# Patient Record
Sex: Female | Born: 1935 | Race: White | Hispanic: No | Marital: Single | Smoking: Never smoker
Health system: Southern US, Community
[De-identification: ages and names within clinical notes are randomized; demographics above are authoritative.]

## PROBLEM LIST (undated history)

## (undated) DIAGNOSIS — Z9049 Acquired absence of other specified parts of digestive tract: Secondary | ICD-10-CM

## (undated) DIAGNOSIS — Y95 Nosocomial condition: Secondary | ICD-10-CM

## (undated) DIAGNOSIS — Y92009 Unspecified place in unspecified non-institutional (private) residence as the place of occurrence of the external cause: Secondary | ICD-10-CM

## (undated) DIAGNOSIS — J189 Pneumonia, unspecified organism: Secondary | ICD-10-CM

## (undated) DIAGNOSIS — C44310 Basal cell carcinoma of skin of unspecified parts of face: Secondary | ICD-10-CM

## (undated) DIAGNOSIS — Z79890 Hormone replacement therapy (postmenopausal): Secondary | ICD-10-CM

## (undated) DIAGNOSIS — T7840XA Allergy, unspecified, initial encounter: Secondary | ICD-10-CM

## (undated) DIAGNOSIS — M545 Low back pain, unspecified: Secondary | ICD-10-CM

## (undated) DIAGNOSIS — R112 Nausea with vomiting, unspecified: Secondary | ICD-10-CM

## (undated) DIAGNOSIS — I499 Cardiac arrhythmia, unspecified: Secondary | ICD-10-CM

## (undated) DIAGNOSIS — W19XXXA Unspecified fall, initial encounter: Secondary | ICD-10-CM

## (undated) DIAGNOSIS — K222 Esophageal obstruction: Secondary | ICD-10-CM

## (undated) DIAGNOSIS — J42 Unspecified chronic bronchitis: Secondary | ICD-10-CM

## (undated) DIAGNOSIS — M1990 Unspecified osteoarthritis, unspecified site: Secondary | ICD-10-CM

## (undated) DIAGNOSIS — Z9889 Other specified postprocedural states: Secondary | ICD-10-CM

## (undated) DIAGNOSIS — Z9289 Personal history of other medical treatment: Secondary | ICD-10-CM

## (undated) DIAGNOSIS — R0602 Shortness of breath: Secondary | ICD-10-CM

## (undated) DIAGNOSIS — J690 Pneumonitis due to inhalation of food and vomit: Secondary | ICD-10-CM

## (undated) DIAGNOSIS — J45909 Unspecified asthma, uncomplicated: Secondary | ICD-10-CM

## (undated) DIAGNOSIS — K219 Gastro-esophageal reflux disease without esophagitis: Secondary | ICD-10-CM

## (undated) DIAGNOSIS — D649 Anemia, unspecified: Secondary | ICD-10-CM

## (undated) DIAGNOSIS — R51 Headache: Secondary | ICD-10-CM

## (undated) DIAGNOSIS — R350 Frequency of micturition: Secondary | ICD-10-CM

## (undated) DIAGNOSIS — C159 Malignant neoplasm of esophagus, unspecified: Secondary | ICD-10-CM

## (undated) DIAGNOSIS — Z8719 Personal history of other diseases of the digestive system: Secondary | ICD-10-CM

## (undated) DIAGNOSIS — G8929 Other chronic pain: Secondary | ICD-10-CM

## (undated) DIAGNOSIS — I4891 Unspecified atrial fibrillation: Secondary | ICD-10-CM

## (undated) HISTORY — DX: Malignant neoplasm of esophagus, unspecified: C15.9

## (undated) HISTORY — DX: Allergy, unspecified, initial encounter: T78.40XA

## (undated) HISTORY — PX: APPENDECTOMY: SHX54

## (undated) HISTORY — PX: CATARACT EXTRACTION W/ INTRAOCULAR LENS  IMPLANT, BILATERAL: SHX1307

## (undated) HISTORY — PX: OTHER SURGICAL HISTORY: SHX169

## (undated) HISTORY — DX: Anemia, unspecified: D64.9

## (undated) HISTORY — DX: Gastro-esophageal reflux disease without esophagitis: K21.9

## (undated) HISTORY — PX: FOREARM FRACTURE SURGERY: SHX649

## (undated) HISTORY — DX: Hormone replacement therapy: Z79.890

## (undated) HISTORY — PX: DILATION AND CURETTAGE OF UTERUS: SHX78

## (undated) HISTORY — PX: CHOLECYSTECTOMY: SHX55

---
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
## 1991-08-02 DIAGNOSIS — Z9289 Personal history of other medical treatment: Secondary | ICD-10-CM

## 1991-08-02 HISTORY — DX: Personal history of other medical treatment: Z92.89

## 1998-03-26 ENCOUNTER — Ambulatory Visit (HOSPITAL_COMMUNITY): Admission: RE | Admit: 1998-03-26 | Discharge: 1998-03-26 | Payer: Self-pay | Admitting: Gastroenterology

## 1998-11-05 ENCOUNTER — Other Ambulatory Visit: Admission: RE | Admit: 1998-11-05 | Discharge: 1998-11-05 | Payer: Self-pay | Admitting: Obstetrics and Gynecology

## 1998-12-01 ENCOUNTER — Encounter: Admission: RE | Admit: 1998-12-01 | Discharge: 1999-02-22 | Payer: Self-pay | Admitting: Internal Medicine

## 1999-08-26 ENCOUNTER — Ambulatory Visit (HOSPITAL_COMMUNITY): Admission: RE | Admit: 1999-08-26 | Discharge: 1999-08-26 | Payer: Self-pay | Admitting: Gastroenterology

## 1999-08-26 ENCOUNTER — Encounter: Payer: Self-pay | Admitting: Gastroenterology

## 1999-10-05 ENCOUNTER — Encounter (INDEPENDENT_AMBULATORY_CARE_PROVIDER_SITE_OTHER): Payer: Self-pay | Admitting: Specialist

## 1999-10-05 ENCOUNTER — Other Ambulatory Visit: Admission: RE | Admit: 1999-10-05 | Discharge: 1999-10-05 | Payer: Self-pay | Admitting: Gastroenterology

## 1999-11-23 ENCOUNTER — Ambulatory Visit (HOSPITAL_COMMUNITY): Admission: RE | Admit: 1999-11-23 | Discharge: 1999-11-23 | Payer: Self-pay | Admitting: Internal Medicine

## 1999-11-23 ENCOUNTER — Encounter (INDEPENDENT_AMBULATORY_CARE_PROVIDER_SITE_OTHER): Payer: Self-pay

## 2000-04-11 ENCOUNTER — Ambulatory Visit (HOSPITAL_COMMUNITY): Admission: RE | Admit: 2000-04-11 | Discharge: 2000-04-11 | Payer: Self-pay | Admitting: Internal Medicine

## 2002-05-02 ENCOUNTER — Other Ambulatory Visit: Admission: RE | Admit: 2002-05-02 | Discharge: 2002-05-02 | Payer: Self-pay | Admitting: Obstetrics and Gynecology

## 2003-09-11 ENCOUNTER — Ambulatory Visit (HOSPITAL_COMMUNITY): Admission: RE | Admit: 2003-09-11 | Discharge: 2003-09-11 | Payer: Self-pay | Admitting: Gastroenterology

## 2004-05-02 ENCOUNTER — Inpatient Hospital Stay (HOSPITAL_COMMUNITY): Admission: EM | Admit: 2004-05-02 | Discharge: 2004-05-06 | Payer: Self-pay | Admitting: Emergency Medicine

## 2004-05-06 ENCOUNTER — Encounter (INDEPENDENT_AMBULATORY_CARE_PROVIDER_SITE_OTHER): Payer: Self-pay | Admitting: *Deleted

## 2004-05-06 ENCOUNTER — Encounter: Payer: Self-pay | Admitting: Internal Medicine

## 2004-06-05 ENCOUNTER — Ambulatory Visit: Payer: Self-pay | Admitting: Internal Medicine

## 2004-06-05 ENCOUNTER — Inpatient Hospital Stay (HOSPITAL_COMMUNITY): Admission: EM | Admit: 2004-06-05 | Discharge: 2004-06-11 | Payer: Self-pay | Admitting: Emergency Medicine

## 2004-06-15 ENCOUNTER — Ambulatory Visit: Payer: Self-pay | Admitting: Internal Medicine

## 2004-07-01 ENCOUNTER — Other Ambulatory Visit: Admission: RE | Admit: 2004-07-01 | Discharge: 2004-07-01 | Payer: Self-pay | Admitting: Obstetrics and Gynecology

## 2004-07-13 ENCOUNTER — Ambulatory Visit: Payer: Self-pay | Admitting: Internal Medicine

## 2004-07-14 ENCOUNTER — Ambulatory Visit: Payer: Self-pay | Admitting: Gastroenterology

## 2004-08-04 ENCOUNTER — Ambulatory Visit (HOSPITAL_COMMUNITY): Admission: RE | Admit: 2004-08-04 | Discharge: 2004-08-04 | Payer: Self-pay | Admitting: Specialist

## 2004-08-13 ENCOUNTER — Ambulatory Visit: Payer: Self-pay | Admitting: Internal Medicine

## 2004-08-17 ENCOUNTER — Encounter: Admission: RE | Admit: 2004-08-17 | Discharge: 2004-08-17 | Payer: Self-pay | Admitting: Internal Medicine

## 2004-08-25 ENCOUNTER — Ambulatory Visit: Payer: Self-pay | Admitting: Gastroenterology

## 2004-08-30 ENCOUNTER — Ambulatory Visit: Payer: Self-pay | Admitting: Internal Medicine

## 2004-09-01 ENCOUNTER — Emergency Department (HOSPITAL_COMMUNITY): Admission: EM | Admit: 2004-09-01 | Discharge: 2004-09-02 | Payer: Self-pay | Admitting: Emergency Medicine

## 2004-09-20 ENCOUNTER — Ambulatory Visit: Payer: Self-pay | Admitting: Gastroenterology

## 2004-09-27 ENCOUNTER — Ambulatory Visit (HOSPITAL_COMMUNITY): Admission: RE | Admit: 2004-09-27 | Discharge: 2004-09-27 | Payer: Self-pay | Admitting: Specialist

## 2004-10-01 ENCOUNTER — Ambulatory Visit: Payer: Self-pay | Admitting: Internal Medicine

## 2004-10-23 ENCOUNTER — Encounter: Admission: RE | Admit: 2004-10-23 | Discharge: 2004-10-23 | Payer: Self-pay | Admitting: Orthopedic Surgery

## 2004-11-24 ENCOUNTER — Ambulatory Visit: Payer: Self-pay | Admitting: Internal Medicine

## 2004-12-13 ENCOUNTER — Encounter: Admission: RE | Admit: 2004-12-13 | Discharge: 2004-12-13 | Payer: Self-pay | Admitting: Internal Medicine

## 2004-12-14 ENCOUNTER — Ambulatory Visit: Payer: Self-pay | Admitting: Internal Medicine

## 2004-12-29 ENCOUNTER — Ambulatory Visit: Payer: Self-pay | Admitting: Cardiology

## 2005-01-11 ENCOUNTER — Ambulatory Visit: Payer: Self-pay | Admitting: Internal Medicine

## 2005-01-13 ENCOUNTER — Other Ambulatory Visit: Admission: RE | Admit: 2005-01-13 | Discharge: 2005-01-13 | Payer: Self-pay | Admitting: Obstetrics and Gynecology

## 2005-02-02 ENCOUNTER — Ambulatory Visit: Payer: Self-pay | Admitting: Gastroenterology

## 2005-02-09 ENCOUNTER — Ambulatory Visit (HOSPITAL_COMMUNITY): Admission: RE | Admit: 2005-02-09 | Discharge: 2005-02-09 | Payer: Self-pay | Admitting: Gastroenterology

## 2005-02-09 ENCOUNTER — Ambulatory Visit: Payer: Self-pay | Admitting: Critical Care Medicine

## 2005-02-15 ENCOUNTER — Ambulatory Visit: Payer: Self-pay | Admitting: Pulmonary Disease

## 2005-02-23 ENCOUNTER — Ambulatory Visit: Payer: Self-pay | Admitting: Internal Medicine

## 2005-03-14 ENCOUNTER — Ambulatory Visit: Payer: Self-pay | Admitting: Critical Care Medicine

## 2005-04-12 ENCOUNTER — Ambulatory Visit: Payer: Self-pay | Admitting: Critical Care Medicine

## 2005-05-25 ENCOUNTER — Ambulatory Visit: Payer: Self-pay | Admitting: Internal Medicine

## 2005-05-30 ENCOUNTER — Ambulatory Visit: Payer: Self-pay | Admitting: Internal Medicine

## 2005-06-08 ENCOUNTER — Ambulatory Visit: Payer: Self-pay | Admitting: Internal Medicine

## 2005-07-13 ENCOUNTER — Ambulatory Visit: Payer: Self-pay | Admitting: Critical Care Medicine

## 2005-08-05 ENCOUNTER — Ambulatory Visit: Payer: Self-pay | Admitting: Internal Medicine

## 2005-08-16 ENCOUNTER — Ambulatory Visit: Payer: Self-pay | Admitting: *Deleted

## 2005-08-16 ENCOUNTER — Ambulatory Visit: Payer: Self-pay | Admitting: Internal Medicine

## 2005-08-17 ENCOUNTER — Ambulatory Visit: Payer: Self-pay | Admitting: Internal Medicine

## 2005-08-18 ENCOUNTER — Ambulatory Visit: Payer: Self-pay | Admitting: Internal Medicine

## 2005-09-29 ENCOUNTER — Ambulatory Visit: Payer: Self-pay | Admitting: Critical Care Medicine

## 2005-10-07 ENCOUNTER — Ambulatory Visit: Payer: Self-pay | Admitting: Critical Care Medicine

## 2005-10-26 ENCOUNTER — Ambulatory Visit: Payer: Self-pay | Admitting: Internal Medicine

## 2005-11-16 ENCOUNTER — Ambulatory Visit: Payer: Self-pay | Admitting: Gastroenterology

## 2006-01-25 ENCOUNTER — Ambulatory Visit: Payer: Self-pay | Admitting: Internal Medicine

## 2006-02-10 ENCOUNTER — Ambulatory Visit: Payer: Self-pay | Admitting: Critical Care Medicine

## 2006-02-24 ENCOUNTER — Ambulatory Visit: Payer: Self-pay | Admitting: Internal Medicine

## 2006-04-13 ENCOUNTER — Ambulatory Visit: Payer: Self-pay | Admitting: Family Medicine

## 2006-04-14 ENCOUNTER — Ambulatory Visit: Payer: Self-pay | Admitting: Family Medicine

## 2006-04-17 ENCOUNTER — Ambulatory Visit: Payer: Self-pay | Admitting: Family Medicine

## 2006-04-28 ENCOUNTER — Ambulatory Visit: Payer: Self-pay | Admitting: Internal Medicine

## 2006-06-02 ENCOUNTER — Ambulatory Visit: Payer: Self-pay | Admitting: Internal Medicine

## 2006-07-20 ENCOUNTER — Ambulatory Visit: Payer: Self-pay | Admitting: Family Medicine

## 2006-09-07 ENCOUNTER — Ambulatory Visit: Payer: Self-pay | Admitting: Internal Medicine

## 2006-09-07 LAB — CONVERTED CEMR LAB
ALT: 17 units/L (ref 0–40)
AST: 18 units/L (ref 0–37)
Albumin: 3.9 g/dL (ref 3.5–5.2)
Alkaline Phosphatase: 67 units/L (ref 39–117)
BUN: 8 mg/dL (ref 6–23)
Basophils Absolute: 0 10*3/uL (ref 0.0–0.1)
Basophils Relative: 0.3 % (ref 0.0–1.0)
Bilirubin, Direct: 0.2 mg/dL (ref 0.0–0.3)
CO2: 34 meq/L — ABNORMAL HIGH (ref 19–32)
Calcium: 10 mg/dL (ref 8.4–10.5)
Chloride: 105 meq/L (ref 96–112)
Cholesterol: 193 mg/dL (ref 0–200)
Creatinine, Ser: 0.7 mg/dL (ref 0.4–1.2)
Eosinophils Absolute: 0.1 10*3/uL (ref 0.0–0.6)
Eosinophils Relative: 3.3 % (ref 0.0–5.0)
GFR calc Af Amer: 105 mL/min
GFR calc non Af Amer: 87 mL/min
Glucose, Bld: 87 mg/dL (ref 70–99)
HCT: 42.6 % (ref 36.0–46.0)
HDL: 59.4 mg/dL (ref >39.0)
Hemoglobin: 14.6 g/dL (ref 12.0–15.0)
LDL Cholesterol: 116 mg/dL — ABNORMAL HIGH (ref 0–99)
Lymphocytes Relative: 36.3 % (ref 12.0–46.0)
MCHC: 34.3 g/dL (ref 30.0–36.0)
MCV: 87.6 fL (ref 78.0–100.0)
Monocytes Absolute: 0.5 10*3/uL (ref 0.2–0.7)
Monocytes Relative: 12.2 % — ABNORMAL HIGH (ref 3.0–11.0)
Neutro Abs: 2.2 10*3/uL (ref 1.4–7.7)
Neutrophils Relative %: 47.9 % (ref 43.0–77.0)
Platelets: 219 10*3/uL (ref 150–400)
Potassium: 4.6 meq/L (ref 3.5–5.1)
RBC: 4.86 M/uL (ref 3.87–5.11)
RDW: 14.2 % (ref 11.5–14.6)
Sodium: 145 meq/L (ref 135–145)
TSH: 1.12 microintl units/mL (ref 0.35–5.50)
Total Bilirubin: 0.6 mg/dL (ref 0.3–1.2)
Total CHOL/HDL Ratio: 3.2
Total Protein: 6.2 g/dL (ref 6.0–8.3)
Triglycerides: 90 mg/dL (ref 0–149)
VLDL: 18 mg/dL (ref 0–40)
WBC: 4.4 10*3/uL — ABNORMAL LOW (ref 4.5–10.5)

## 2006-09-26 ENCOUNTER — Ambulatory Visit: Payer: Self-pay | Admitting: Gastroenterology

## 2006-10-30 ENCOUNTER — Ambulatory Visit: Payer: Self-pay | Admitting: Internal Medicine

## 2007-01-02 ENCOUNTER — Ambulatory Visit: Payer: Self-pay | Admitting: Internal Medicine

## 2007-01-22 ENCOUNTER — Ambulatory Visit: Payer: Self-pay | Admitting: Internal Medicine

## 2007-03-13 DIAGNOSIS — E538 Deficiency of other specified B group vitamins: Secondary | ICD-10-CM | POA: Insufficient documentation

## 2007-03-13 DIAGNOSIS — M810 Age-related osteoporosis without current pathological fracture: Secondary | ICD-10-CM | POA: Insufficient documentation

## 2007-03-13 DIAGNOSIS — K219 Gastro-esophageal reflux disease without esophagitis: Secondary | ICD-10-CM | POA: Insufficient documentation

## 2007-03-14 ENCOUNTER — Ambulatory Visit: Payer: Self-pay | Admitting: Internal Medicine

## 2007-03-14 DIAGNOSIS — D509 Iron deficiency anemia, unspecified: Secondary | ICD-10-CM

## 2007-03-14 LAB — CONVERTED CEMR LAB
Basophils Absolute: 0 10*3/uL (ref 0.0–0.1)
Basophils Relative: 0.4 % (ref 0.0–1.0)
Eosinophils Absolute: 0.2 10*3/uL (ref 0.0–0.6)
Eosinophils Relative: 2 % (ref 0.0–5.0)
Folate: >20 ng/mL
HCT: 44.5 % (ref 36.0–46.0)
Hemoglobin: 14.8 g/dL (ref 12.0–15.0)
Iron: 38 ug/dL — ABNORMAL LOW (ref 42–145)
Lymphocytes Relative: 26.1 % (ref 12.0–46.0)
MCHC: 33.3 g/dL (ref 30.0–36.0)
MCV: 88.3 fL (ref 78.0–100.0)
Monocytes Absolute: 0.8 10*3/uL — ABNORMAL HIGH (ref 0.2–0.7)
Monocytes Relative: 9.6 % (ref 3.0–11.0)
Neutro Abs: 5.4 10*3/uL (ref 1.4–7.7)
Neutrophils Relative %: 61.9 % (ref 43.0–77.0)
Platelets: 229 10*3/uL (ref 150–400)
RBC: 5.04 M/uL (ref 3.87–5.11)
RDW: 13.7 % (ref 11.5–14.6)
Vitamin B-12: 807 pg/mL (ref 211–911)
WBC: 8.6 10*3/uL (ref 4.5–10.5)

## 2007-03-15 ENCOUNTER — Telehealth: Payer: Self-pay | Admitting: Internal Medicine

## 2007-06-13 ENCOUNTER — Encounter: Payer: Self-pay | Admitting: Internal Medicine

## 2007-06-13 ENCOUNTER — Ambulatory Visit: Payer: Self-pay | Admitting: Internal Medicine

## 2007-06-14 ENCOUNTER — Ambulatory Visit: Payer: Self-pay | Admitting: Internal Medicine

## 2007-06-14 DIAGNOSIS — J309 Allergic rhinitis, unspecified: Secondary | ICD-10-CM | POA: Insufficient documentation

## 2007-06-14 DIAGNOSIS — I10 Essential (primary) hypertension: Secondary | ICD-10-CM | POA: Insufficient documentation

## 2007-06-14 DIAGNOSIS — C159 Malignant neoplasm of esophagus, unspecified: Secondary | ICD-10-CM | POA: Insufficient documentation

## 2007-06-14 LAB — CONVERTED CEMR LAB
Basophils Absolute: 0 10*3/uL (ref 0.0–0.1)
Basophils Relative: 0.3 % (ref 0.0–1.0)
Eosinophils Absolute: 0.2 10*3/uL (ref 0.0–0.6)
Eosinophils Relative: 2 % (ref 0.0–5.0)
Folate: >20 ng/mL
HCT: 42.6 % (ref 36.0–46.0)
Hemoglobin: 15 g/dL (ref 12.0–15.0)
Iron: 54 ug/dL (ref 42–145)
Lymphocytes Relative: 26.6 % (ref 12.0–46.0)
MCHC: 35.3 g/dL (ref 30.0–36.0)
MCV: 86.9 fL (ref 78.0–100.0)
Monocytes Absolute: 0.7 10*3/uL (ref 0.2–0.7)
Monocytes Relative: 8.6 % (ref 3.0–11.0)
Neutro Abs: 5.1 10*3/uL (ref 1.4–7.7)
Neutrophils Relative %: 62.5 % (ref 43.0–77.0)
Platelets: 236 10*3/uL (ref 150–400)
RBC: 4.9 M/uL (ref 3.87–5.11)
RDW: 13.4 % (ref 11.5–14.6)
Vitamin B-12: 738 pg/mL (ref 211–911)
WBC: 8.2 10*3/uL (ref 4.5–10.5)

## 2007-08-14 ENCOUNTER — Ambulatory Visit: Payer: Self-pay | Admitting: Gastroenterology

## 2007-08-29 ENCOUNTER — Ambulatory Visit: Payer: Self-pay | Admitting: Gastroenterology

## 2007-08-29 ENCOUNTER — Encounter: Payer: Self-pay | Admitting: Internal Medicine

## 2007-09-06 ENCOUNTER — Encounter: Payer: Self-pay | Admitting: Internal Medicine

## 2007-09-07 ENCOUNTER — Ambulatory Visit: Payer: Self-pay | Admitting: Internal Medicine

## 2007-11-02 ENCOUNTER — Ambulatory Visit: Payer: Self-pay | Admitting: Gastroenterology

## 2007-12-05 ENCOUNTER — Ambulatory Visit: Payer: Self-pay | Admitting: Internal Medicine

## 2007-12-05 LAB — CONVERTED CEMR LAB
Basophils Absolute: 0 10*3/uL (ref 0.0–0.1)
Basophils Relative: 0.1 % (ref 0.0–1.0)
Eosinophils Absolute: 0.1 10*3/uL (ref 0.0–0.7)
Eosinophils Relative: 1.9 % (ref 0.0–5.0)
Folate: >20 ng/mL
HCT: 44.2 % (ref 36.0–46.0)
Hemoglobin: 14.6 g/dL (ref 12.0–15.0)
Iron: 66 ug/dL (ref 42–145)
Lymphocytes Relative: 30 % (ref 12.0–46.0)
MCHC: 33 g/dL (ref 30.0–36.0)
MCV: 88.6 fL (ref 78.0–100.0)
Monocytes Absolute: 0.6 10*3/uL (ref 0.1–1.0)
Monocytes Relative: 9.4 % (ref 3.0–12.0)
Neutro Abs: 3.6 10*3/uL (ref 1.4–7.7)
Neutrophils Relative %: 58.6 % (ref 43.0–77.0)
Platelets: 224 10*3/uL (ref 150–400)
RBC: 4.99 M/uL (ref 3.87–5.11)
RDW: 14 % (ref 11.5–14.6)
Vit D, 1,25-Dihydroxy: 44 (ref 30–89)
Vitamin B-12: 663 pg/mL (ref 211–911)
WBC: 6.1 10*3/uL (ref 4.5–10.5)

## 2008-03-06 ENCOUNTER — Ambulatory Visit: Payer: Self-pay | Admitting: Internal Medicine

## 2008-03-06 DIAGNOSIS — J209 Acute bronchitis, unspecified: Secondary | ICD-10-CM | POA: Insufficient documentation

## 2008-04-02 ENCOUNTER — Ambulatory Visit: Payer: Self-pay | Admitting: Family Medicine

## 2008-06-11 ENCOUNTER — Ambulatory Visit: Payer: Self-pay | Admitting: Internal Medicine

## 2008-06-11 LAB — CONVERTED CEMR LAB
BUN: 13 mg/dL (ref 6–23)
Basophils Absolute: 0 10*3/uL (ref 0.0–0.1)
Basophils Relative: 0.1 % (ref 0.0–3.0)
CO2: 34 meq/L — ABNORMAL HIGH (ref 19–32)
Calcium: 9.8 mg/dL (ref 8.4–10.5)
Chloride: 103 meq/L (ref 96–112)
Creatinine, Ser: 0.9 mg/dL (ref 0.4–1.2)
Eosinophils Absolute: 0.1 10*3/uL (ref 0.0–0.7)
Eosinophils Relative: 2.6 % (ref 0.0–5.0)
Folate: >20 ng/mL
GFR calc Af Amer: 78 mL/min
GFR calc non Af Amer: 65 mL/min
Glucose, Bld: 116 mg/dL — ABNORMAL HIGH (ref 70–99)
HCT: 44 % (ref 36.0–46.0)
Hemoglobin: 14.8 g/dL (ref 12.0–15.0)
Lymphocytes Relative: 31.7 % (ref 12.0–46.0)
MCHC: 33.6 g/dL (ref 30.0–36.0)
MCV: 89.7 fL (ref 78.0–100.0)
Monocytes Absolute: 0.5 10*3/uL (ref 0.1–1.0)
Monocytes Relative: 9.1 % (ref 3.0–12.0)
Neutro Abs: 3.3 10*3/uL (ref 1.4–7.7)
Neutrophils Relative %: 56.5 % (ref 43.0–77.0)
Platelets: 231 10*3/uL (ref 150–400)
Potassium: 4.4 meq/L (ref 3.5–5.1)
RBC: 4.91 M/uL (ref 3.87–5.11)
RDW: 14.4 % (ref 11.5–14.6)
Sodium: 145 meq/L (ref 135–145)
Vit D, 1,25-Dihydroxy: 46 (ref 30–89)
Vitamin B-12: 754 pg/mL (ref 211–911)
WBC: 5.7 10*3/uL (ref 4.5–10.5)

## 2008-07-16 ENCOUNTER — Ambulatory Visit: Payer: Self-pay | Admitting: Family Medicine

## 2008-08-22 ENCOUNTER — Emergency Department (HOSPITAL_COMMUNITY): Admission: EM | Admit: 2008-08-22 | Discharge: 2008-08-22 | Payer: Self-pay | Admitting: Emergency Medicine

## 2008-08-22 ENCOUNTER — Telehealth: Payer: Self-pay | Admitting: Internal Medicine

## 2008-08-26 ENCOUNTER — Ambulatory Visit: Payer: Self-pay | Admitting: Internal Medicine

## 2008-08-26 DIAGNOSIS — J690 Pneumonitis due to inhalation of food and vomit: Secondary | ICD-10-CM | POA: Insufficient documentation

## 2008-08-27 ENCOUNTER — Telehealth: Payer: Self-pay | Admitting: Gastroenterology

## 2008-08-29 ENCOUNTER — Ambulatory Visit: Payer: Self-pay | Admitting: Gastroenterology

## 2008-08-29 DIAGNOSIS — Z8501 Personal history of malignant neoplasm of esophagus: Secondary | ICD-10-CM

## 2008-09-03 ENCOUNTER — Ambulatory Visit (HOSPITAL_COMMUNITY): Admission: RE | Admit: 2008-09-03 | Discharge: 2008-09-03 | Payer: Self-pay | Admitting: Gastroenterology

## 2008-09-08 ENCOUNTER — Encounter: Payer: Self-pay | Admitting: Internal Medicine

## 2008-09-15 ENCOUNTER — Ambulatory Visit: Payer: Self-pay | Admitting: Gastroenterology

## 2008-10-01 ENCOUNTER — Encounter: Payer: Self-pay | Admitting: Internal Medicine

## 2008-10-15 ENCOUNTER — Ambulatory Visit: Payer: Self-pay | Admitting: Internal Medicine

## 2008-10-15 LAB — CONVERTED CEMR LAB: Vit D, 25-Hydroxy: 51 ng/mL (ref 30–89)

## 2009-01-23 ENCOUNTER — Ambulatory Visit: Payer: Self-pay | Admitting: Internal Medicine

## 2009-01-23 LAB — CONVERTED CEMR LAB
Basophils Absolute: 0 10*3/uL (ref 0.0–0.1)
Basophils Relative: 0 % (ref 0.0–3.0)
Eosinophils Absolute: 0.1 10*3/uL (ref 0.0–0.7)
Eosinophils Relative: 1.3 % (ref 0.0–5.0)
HCT: 41.3 % (ref 36.0–46.0)
Hemoglobin: 13.7 g/dL (ref 12.0–15.0)
Iron: 47 ug/dL (ref 42–145)
Lymphocytes Relative: 29.9 % (ref 12.0–46.0)
Lymphs Abs: 1.7 10*3/uL (ref 0.7–4.0)
MCHC: 33.2 g/dL (ref 30.0–36.0)
MCV: 89.6 fL (ref 78.0–100.0)
Monocytes Absolute: 0.6 10*3/uL (ref 0.1–1.0)
Monocytes Relative: 10 % (ref 3.0–12.0)
Neutro Abs: 3.2 10*3/uL (ref 1.4–7.7)
Neutrophils Relative %: 58.8 % (ref 43.0–77.0)
Platelets: 187 10*3/uL (ref 150.0–400.0)
RBC: 4.61 M/uL (ref 3.87–5.11)
RDW: 13.4 % (ref 11.5–14.6)
Saturation Ratios: 15.8 % — ABNORMAL LOW (ref 20.0–50.0)
Transferrin: 212.3 mg/dL (ref 212.0–360.0)
WBC: 5.6 10*3/uL (ref 4.5–10.5)

## 2009-04-27 ENCOUNTER — Ambulatory Visit: Payer: Self-pay | Admitting: Internal Medicine

## 2009-04-28 ENCOUNTER — Encounter (INDEPENDENT_AMBULATORY_CARE_PROVIDER_SITE_OTHER): Payer: Self-pay | Admitting: *Deleted

## 2009-06-09 ENCOUNTER — Ambulatory Visit: Payer: Self-pay | Admitting: Gastroenterology

## 2009-06-09 DIAGNOSIS — Z860100 Personal history of colon polyps, unspecified: Secondary | ICD-10-CM | POA: Insufficient documentation

## 2009-06-10 ENCOUNTER — Encounter: Payer: Self-pay | Admitting: Gastroenterology

## 2009-06-10 ENCOUNTER — Telehealth: Payer: Self-pay | Admitting: Gastroenterology

## 2009-06-10 ENCOUNTER — Ambulatory Visit: Payer: Self-pay | Admitting: Gastroenterology

## 2009-06-16 ENCOUNTER — Encounter: Payer: Self-pay | Admitting: Gastroenterology

## 2009-08-03 ENCOUNTER — Ambulatory Visit: Payer: Self-pay | Admitting: Internal Medicine

## 2009-08-21 ENCOUNTER — Encounter: Payer: Self-pay | Admitting: Internal Medicine

## 2009-08-21 ENCOUNTER — Ambulatory Visit: Payer: Self-pay | Admitting: Family Medicine

## 2009-09-09 ENCOUNTER — Encounter: Payer: Self-pay | Admitting: Internal Medicine

## 2009-09-24 ENCOUNTER — Telehealth (INDEPENDENT_AMBULATORY_CARE_PROVIDER_SITE_OTHER): Payer: Self-pay | Admitting: *Deleted

## 2009-09-24 ENCOUNTER — Ambulatory Visit: Payer: Self-pay | Admitting: Family Medicine

## 2009-09-24 DIAGNOSIS — R05 Cough: Secondary | ICD-10-CM

## 2009-09-25 ENCOUNTER — Encounter: Payer: Self-pay | Admitting: Internal Medicine

## 2009-11-02 ENCOUNTER — Ambulatory Visit: Payer: Self-pay | Admitting: Internal Medicine

## 2009-11-02 LAB — CONVERTED CEMR LAB
BUN: 13 mg/dL (ref 6–23)
Basophils Absolute: 0 10*3/uL (ref 0.0–0.1)
Basophils Relative: 0.2 % (ref 0.0–3.0)
CO2: 35 meq/L — ABNORMAL HIGH (ref 19–32)
Calcium: 9.5 mg/dL (ref 8.4–10.5)
Chloride: 100 meq/L (ref 96–112)
Creatinine, Ser: 0.7 mg/dL (ref 0.4–1.2)
Eosinophils Absolute: 0.1 10*3/uL (ref 0.0–0.7)
Eosinophils Relative: 1.7 % (ref 0.0–5.0)
Folate: 18.1 ng/mL
GFR calc non Af Amer: 85.89 mL/min (ref >60)
Glucose, Bld: 106 mg/dL — ABNORMAL HIGH (ref 70–99)
HCT: 42.8 % (ref 36.0–46.0)
Hemoglobin: 14.5 g/dL (ref 12.0–15.0)
Iron: 60 ug/dL (ref 42–145)
Lymphocytes Relative: 29.5 % (ref 12.0–46.0)
Lymphs Abs: 1.9 10*3/uL (ref 0.7–4.0)
MCHC: 33.9 g/dL (ref 30.0–36.0)
MCV: 89.5 fL (ref 78.0–100.0)
Monocytes Absolute: 0.6 10*3/uL (ref 0.1–1.0)
Monocytes Relative: 9.3 % (ref 3.0–12.0)
Neutro Abs: 3.9 10*3/uL (ref 1.4–7.7)
Neutrophils Relative %: 59.3 % (ref 43.0–77.0)
Platelets: 230 10*3/uL (ref 150.0–400.0)
Potassium: 3.9 meq/L (ref 3.5–5.1)
RBC: 4.78 M/uL (ref 3.87–5.11)
RDW: 15.7 % — ABNORMAL HIGH (ref 11.5–14.6)
Saturation Ratios: 18.7 % — ABNORMAL LOW (ref 20.0–50.0)
Sodium: 146 meq/L — ABNORMAL HIGH (ref 135–145)
Transferrin: 229.2 mg/dL (ref 212.0–360.0)
Vitamin B-12: 639 pg/mL (ref 211–911)
WBC: 6.6 10*3/uL (ref 4.5–10.5)

## 2009-11-05 ENCOUNTER — Ambulatory Visit: Payer: Self-pay | Admitting: Cardiovascular Disease

## 2009-11-16 LAB — CONVERTED CEMR LAB: Vit D, 25-Hydroxy: 29 ng/mL — ABNORMAL LOW (ref 30–89)

## 2009-12-09 ENCOUNTER — Ambulatory Visit: Payer: Self-pay | Admitting: Family Medicine

## 2009-12-23 ENCOUNTER — Telehealth: Payer: Self-pay | Admitting: Internal Medicine

## 2009-12-31 ENCOUNTER — Telehealth: Payer: Self-pay | Admitting: Internal Medicine

## 2010-02-09 ENCOUNTER — Ambulatory Visit: Payer: Self-pay | Admitting: Internal Medicine

## 2010-02-09 LAB — CONVERTED CEMR LAB
Basophils Absolute: 0 10*3/uL (ref 0.0–0.1)
Basophils Relative: 0.3 % (ref 0.0–3.0)
Eosinophils Absolute: 0.1 10*3/uL (ref 0.0–0.7)
Eosinophils Relative: 1.9 % (ref 0.0–5.0)
Folate: >20 ng/mL
HCT: 43.4 % (ref 36.0–46.0)
Hemoglobin: 14.4 g/dL (ref 12.0–15.0)
Iron: 59 ug/dL (ref 42–145)
Lymphocytes Relative: 29.5 % (ref 12.0–46.0)
Lymphs Abs: 1.8 10*3/uL (ref 0.7–4.0)
MCHC: 33.3 g/dL (ref 30.0–36.0)
MCV: 90.3 fL (ref 78.0–100.0)
Monocytes Absolute: 0.4 10*3/uL (ref 0.1–1.0)
Monocytes Relative: 6.6 % (ref 3.0–12.0)
Neutro Abs: 3.7 10*3/uL (ref 1.4–7.7)
Neutrophils Relative %: 61.7 % (ref 43.0–77.0)
Platelets: 247 10*3/uL (ref 150.0–400.0)
RBC: 4.81 M/uL (ref 3.87–5.11)
RDW: 14.8 % — ABNORMAL HIGH (ref 11.5–14.6)
Saturation Ratios: 20.2 % (ref 20.0–50.0)
Transferrin: 209 mg/dL — ABNORMAL LOW (ref 212.0–360.0)
Vit D, 25-Hydroxy: 43 ng/mL (ref 30–89)
Vitamin B-12: 809 pg/mL (ref 211–911)
WBC: 6 10*3/uL (ref 4.5–10.5)

## 2010-02-15 ENCOUNTER — Encounter: Payer: Self-pay | Admitting: Gastroenterology

## 2010-02-18 ENCOUNTER — Telehealth: Payer: Self-pay | Admitting: Internal Medicine

## 2010-02-18 ENCOUNTER — Telehealth: Payer: Self-pay | Admitting: Gastroenterology

## 2010-02-22 ENCOUNTER — Telehealth: Payer: Self-pay | Admitting: Internal Medicine

## 2010-02-23 ENCOUNTER — Telehealth: Payer: Self-pay | Admitting: Internal Medicine

## 2010-04-07 ENCOUNTER — Telehealth: Payer: Self-pay | Admitting: Gastroenterology

## 2010-05-11 ENCOUNTER — Ambulatory Visit: Payer: Self-pay | Admitting: Internal Medicine

## 2010-06-10 ENCOUNTER — Ambulatory Visit: Payer: Self-pay | Admitting: Internal Medicine

## 2010-06-12 ENCOUNTER — Emergency Department (HOSPITAL_COMMUNITY): Admission: EM | Admit: 2010-06-12 | Discharge: 2010-06-12 | Payer: Self-pay | Admitting: Emergency Medicine

## 2010-06-14 ENCOUNTER — Telehealth: Payer: Self-pay | Admitting: Internal Medicine

## 2010-06-18 ENCOUNTER — Ambulatory Visit: Payer: Self-pay | Admitting: Gastroenterology

## 2010-06-21 ENCOUNTER — Telehealth: Payer: Self-pay | Admitting: Gastroenterology

## 2010-06-21 ENCOUNTER — Encounter: Payer: Self-pay | Admitting: Gastroenterology

## 2010-06-28 ENCOUNTER — Telehealth: Payer: Self-pay | Admitting: Gastroenterology

## 2010-07-02 ENCOUNTER — Telehealth: Payer: Self-pay | Admitting: Gastroenterology

## 2010-07-05 ENCOUNTER — Encounter: Payer: Self-pay | Admitting: Gastroenterology

## 2010-07-08 ENCOUNTER — Telehealth: Payer: Self-pay | Admitting: Gastroenterology

## 2010-07-08 ENCOUNTER — Encounter (INDEPENDENT_AMBULATORY_CARE_PROVIDER_SITE_OTHER): Payer: Self-pay | Admitting: *Deleted

## 2010-07-12 ENCOUNTER — Telehealth: Payer: Self-pay | Admitting: Gastroenterology

## 2010-07-14 ENCOUNTER — Encounter: Payer: Self-pay | Admitting: Gastroenterology

## 2010-07-19 ENCOUNTER — Telehealth: Payer: Self-pay | Admitting: Gastroenterology

## 2010-08-12 ENCOUNTER — Other Ambulatory Visit: Payer: Self-pay | Admitting: Internal Medicine

## 2010-08-12 ENCOUNTER — Ambulatory Visit
Admission: RE | Admit: 2010-08-12 | Discharge: 2010-08-12 | Payer: Self-pay | Source: Home / Self Care | Attending: Internal Medicine | Admitting: Internal Medicine

## 2010-08-12 LAB — CBC WITH DIFFERENTIAL/PLATELET
Basophils Absolute: 0 10*3/uL (ref 0.0–0.1)
Basophils Relative: 0.4 % (ref 0.0–3.0)
Eosinophils Absolute: 0.2 10*3/uL (ref 0.0–0.7)
Eosinophils Relative: 2.7 % (ref 0.0–5.0)
HCT: 42.3 % (ref 36.0–46.0)
Hemoglobin: 14.3 g/dL (ref 12.0–15.0)
Lymphocytes Relative: 24.7 % (ref 12.0–46.0)
Lymphs Abs: 1.7 10*3/uL (ref 0.7–4.0)
MCHC: 33.9 g/dL (ref 30.0–36.0)
MCV: 88.7 fl (ref 78.0–100.0)
Monocytes Absolute: 0.7 10*3/uL (ref 0.1–1.0)
Monocytes Relative: 10.4 % (ref 3.0–12.0)
Neutro Abs: 4.2 10*3/uL (ref 1.4–7.7)
Neutrophils Relative %: 61.8 % (ref 43.0–77.0)
Platelets: 217 10*3/uL (ref 150.0–400.0)
RBC: 4.76 Mil/uL (ref 3.87–5.11)
RDW: 15.4 % — ABNORMAL HIGH (ref 11.5–14.6)
WBC: 6.7 10*3/uL (ref 4.5–10.5)

## 2010-08-12 LAB — BASIC METABOLIC PANEL
BUN: 11 mg/dL (ref 6–23)
CO2: 27 mEq/L (ref 19–32)
Calcium: 8.9 mg/dL (ref 8.4–10.5)
Chloride: 96 mEq/L (ref 96–112)
Creatinine, Ser: 0.7 mg/dL (ref 0.4–1.2)
GFR: 80.39 mL/min (ref >60.00)
Glucose, Bld: 105 mg/dL — ABNORMAL HIGH (ref 70–99)
Potassium: 3.9 mEq/L (ref 3.5–5.1)
Sodium: 142 mEq/L (ref 135–145)

## 2010-08-12 LAB — IBC PANEL
Iron: 37 ug/dL — ABNORMAL LOW (ref 42–145)
Saturation Ratios: 13.4 % — ABNORMAL LOW (ref 20.0–50.0)
Transferrin: 197 mg/dL — ABNORMAL LOW (ref 212.0–360.0)

## 2010-08-12 LAB — B12 AND FOLATE PANEL
Folate: >20 ng/mL
Vitamin B-12: >1500 pg/mL — ABNORMAL HIGH (ref 211–911)

## 2010-08-17 ENCOUNTER — Telehealth: Payer: Self-pay | Admitting: Internal Medicine

## 2010-08-17 ENCOUNTER — Ambulatory Visit
Admission: RE | Admit: 2010-08-17 | Discharge: 2010-08-17 | Payer: Self-pay | Source: Home / Self Care | Attending: Internal Medicine | Admitting: Internal Medicine

## 2010-08-31 NOTE — Miscellaneous (Signed)
Summary: BONE DENSITY  Clinical Lists Changes  Orders: Added new Test order of T-Bone Densitometry (77080) - Signed Added new Test order of T-Lumbar Vertebral Assessment (77082) - Signed 

## 2010-08-31 NOTE — Progress Notes (Signed)
Summary: Medication   Phone Note Call from Patient Call back at Home Phone [phone]   Caller: Patient Call For: Dr. Christella Hartigan Reason for Call: Talk to Nurse Summary of Call: Pt is calling because he medication was not approved because "we didnt fill out the questions correctly" she is requesting that we call [facility] to  address this issue so she does not run out of medication Initial call taken by: [place] Johnson,  July 08, 2010 9:24 AM  Follow-up for Phone Call        an appeal letter is being faxed to to [facility] adding GERD as a diagnosis.   Follow-up by: Chales Abrahams CMA Duncan Dull),  July 08, 2010 9:56 AM

## 2010-08-31 NOTE — Assessment & Plan Note (Signed)
Review of gastrointestinal problems: 1. Status post esophagectomy and gastric pull up for esophageal cancer, 1993. 2. Chronic GERD.  Dyspepsia, likely much of it from #1.  EGD January 2009 showed high anastomosis (18 cm from incisors), ring-like narrowing at the anastomosis, but non-adenomatous appearing.  No esophagitis.  No Barrett's changes. 3. adenomatous colon polyps,  removed October 2005.  November, 2010 colonoscopy found small adenomatous polyp. Was recommended against routine surveillance (would be 76 years old at time of next colonoscopy)   History of Present Illness Visit Type: Follow-up Visit Primary GI MD: Rob Bunting MD Primary Provider: Stacie Glaze, MD  Requesting Provider: n/a Chief Complaint: F/u from taking Reglan and Prevacid History of Present Illness:      very pleasant 77 year old woman whom I last saw over a year ago. had "stomach virus" last week, vomited a lot. went to urgent care, was given antibiotic, fluids.  Vomited again a couple days later.   No diarrhea, no abd pains.  No sick contacts but she is around a lot of young kids usually.  NO dysphagia.  Overall stable weight.           Current Medications (verified): 1)  Promethegan 25 Mg  Supp (Promethazine Hcl) .... As Needed 2)  Ambien 5 Mg  Tabs (Zolpidem Tartrate) .... As Needed 3)  Ziac 2.5-6.25 Mg  Tabs (Bisoprolol-Hydrochlorothiazide) .... Once Daily 4)  Reglan 5 Mg  Tabs (Metoclopramide Hcl) .... Take 1 Tablet By Mouth Three Times A Day 5)  Carafate 1 Gm/84ml  Susp (Sucralfate) .... Three Times A Day 6)  Xyzal 5 Mg  Tabs (Levocetirizine Dihydrochloride) .... Once Daily 7)  Hemocyte Plus 106-1 Mg Caps (B Complex-C-Min-Fe-Fa) .Marland Kitchen.. 1 Two Times A Day 8)  Viactiv 500-100-40  Chew (Calcium-Vitamin D-Vitamin K) .... 3 Per Day 9)  Vitamin D 16109 Unit  Caps (Ergocalciferol) .... One By Mouth Weekly 10)  Prevacid Solutab 30 Mg Tbdp (Lansoprazole) .... Dissolve 2  Tablets Under Tongue  Two Times A  Day  Allergies (verified): 1)  Erythromycin Ethylsuccinate 2)  Prednisone (Prednisone)  Vital Signs:  Patient profile:   77 year old female Height:      63 inches Weight:      128 pounds BMI:     22.76 BSA:     1.60 Pulse rate:   60 / minute Pulse rhythm:   regular BP sitting:   110 / 64  (left arm) Cuff size:   regular  Vitals Entered By: Ok Anis CMA (June 18, 2010 2:01 PM) CC: F/u from taking Reglan and Prevacid   Physical Exam  Additional Exam:  Constitutional: generally well appearing Psychiatric: alert and oriented times 3 Abdomen: soft, non-tender, non-distended, normal bowel sounds    Impression & Recommendations:  Problem # 1:  chronic GERD, recent vomiting her recent vomiting illness came and went rather acutely. I do suspect that it was an infectious illness. She does know however to get in touch if this becomes a recurrent problem for her. She has post esophagectomy anatomy. I will refill her Reglan and Prevacid.  Patient Instructions: 1)  Refills on meds given (reglan and prevacid). 2)  Call Dr. Christella Hartigan' office if you have signficant recurrent vomiting. 3)  The medication list was reviewed and reconciled.  All changed / newly prescribed medications were explained.  A complete medication list was provided to the patient / caregiver. Prescriptions: PREVACID SOLUTAB 30 MG TBDP (LANSOPRAZOLE) dissolve 2  tablets under tongue  two times a day  #  120 x 11   Entered and Authorized by:   Rachael Fee MD   Signed by:   Rachael Fee MD on 06/18/2010   Method used:   Faxed to ...       [id number] (retail)       [address]       [place], [place]  [address]       Ph: [phone] or [phone]       Fax: [phone]   RxID:   [id number] REGLAN 5 MG  TABS (METOCLOPRAMIDE HCL) Take 1 tablet by mouth twice a day  #60 x 11   Entered and Authorized by:   Rachael Fee MD   Signed by:   Rachael Fee MD on  06/18/2010   Method used:   Faxed to ...       [id number] (retail)       [address]       [place], [place]  [address]       Ph: [phone] or [phone]       Fax: [phone]   RxID:   [id number]

## 2010-08-31 NOTE — Progress Notes (Signed)
Summary: doctor switch request   Phone Note Call from Patient Call back at Home Phone [phone]   Caller: Patient Call For: Dr. Christella Hartigan Reason for Call: Talk to Doctor Summary of Call: pt would like to switch GI care from Dr. Christella Hartigan to Dr. Marina Goodell... pt states that Dr. Christella Hartigan has "written me off because I am so old and will not refill medications for me"... (pt didnt ask for doctor switch until I told her the appt dates she was asking for were not available on Dr. Christella Hartigan' schedule, but she was orginally trying to sch w/ Dr. Christella Hartigan at beginning of phone call) Initial call taken by: Vallarie Mare,  April 07, 2010 2:34 PM  Follow-up for Phone Call        I am happy to care for her if she wants.  Also ok to switch to another provider if they are agreeable.  I have not seen her in about a year (polyp surveillance colonoscopy).  I have no idea what she means by "i have written her off because she is too old" Follow-up by: Rachael Fee MD,  April 07, 2010 2:46 PM  Additional Follow-up for Phone Call Additional follow up Details #1::        not sure I see a good reason to switch. i would encourage her to keep Dr Christella Hartigan Additional Follow-up by: Hilarie Fredrickson MD,  April 08, 2010 10:00 AM    Additional Follow-up for Phone Call Additional follow up Details #2::    left message on machine to call back Chales Abrahams CMA Duncan Dull)  April 08, 2010 10:04 AM   pt returned call and was shceduled to see Dr Christella Hartigan in Nov.  per pt request Follow-up by: Chales Abrahams CMA Duncan Dull),  April 08, 2010 12:44 PM

## 2010-08-31 NOTE — Progress Notes (Signed)
Summary: Medication   Phone Note Call from Patient Call back at Home Phone [phone]   Caller: Patient Call For: Dr. Christella Hartigan Reason for Call: Talk to Nurse Summary of Call: Pt called because she spoke with [facility] and they gave her this number [phone] for [place] to call for the pre auth on her prevacid, Pt wants to be informed when we have called [facility] Initial call taken by: [place] Johnson,  July 02, 2010 8:04 AM  Follow-up for Phone Call        Prior auth. form requested from [facility].  Teryl Lucy RN  July 02, 2010 9:10 AM  Pt. informed that form was rec'd. and will be put on Dr.Jacob's desk for him to complete and sign on Monday when he returns to the office. Follow-up by: Teryl Lucy RN,  July 02, 2010 1:44 PM

## 2010-08-31 NOTE — Progress Notes (Signed)
Summary: Pt called re: iron capsule  Phone Note Call from Patient Call back at Home Phone [phone]   Caller: Patient Summary of Call: Pt called re: an iron capsule that she has been taking for 10 yrs. Wants to know why it can not be filled. Initial call taken by: Lucy Antigua,  December 31, 2009 8:05 AM    Prescriptions: HEMOCYTE PLUS 106-1 MG CAPS (B COMPLEX-C-MIN-FE-FA) 1 two times a day  #10 x 0   Entered by:   Willy Eddy, LPN   Authorized by:   Stacie Glaze MD   Signed by:   Willy Eddy, LPN on 09/81/1914   Method used:   Electronically to        [facility] [address] [address] [id number] (retail)       [address]       [place], [place]  [address]       Ph: [phone] or [phone]       Fax: [phone]   RxID:   [id number]

## 2010-08-31 NOTE — Progress Notes (Signed)
Summary: Refill questions   Phone Note Call from Patient Call back at Home Phone [phone]   Caller: Patient Call For: Dr. Christella Hartigan Summary of Call: Pt is having problem with RX through [facility] and says that [facility] wants [place] to call them, this is the number [phone] option 2, call the patient if you have any questions Initial call taken by: Raechel Chute,  June 28, 2010 8:07 AM  Follow-up for Phone Call        spoke with the pt and informed her i will call [facility] on tue.  Dr Christella Hartigan is in the office today. Follow-up by: Chales Abrahams CMA Duncan Dull),  June 28, 2010 8:17 AM  Additional Follow-up for Phone Call Additional follow up Details #1::        [facility] rep states that the prevacid needs a prior auth form will be faxed Additional Follow-up by: Chales Abrahams CMA Duncan Dull),  June 28, 2010 4:17 PM

## 2010-08-31 NOTE — Progress Notes (Signed)
Summary: Pt says [facility] refuses to fill Reglan,due to Parkinsons risk  Phone Note Call from Patient Call back at [facility] Phone [phone]   Caller: Patient Reason for Call: Acute Illness Complaint: Urinary/GYN Problems Summary of Call: Pt called re: Reglan. [facility] still refuses to refill med, due to Parkinson rish. [facility] said that they sent Dr Lovell Sheehan a fax about this matter and they will not refill the script until they hear back from Dr. Lovell Sheehan. Pt req that Dr Lovell Sheehan or Rushie Goltz  call [facility] or complete the info that [facility] said they sent and return it asap.  Initial call taken by: Lucy Antigua,  February 23, 2010 8:11 AM  Follow-up for Phone Call        called [facility] at [phone]-- pharmacist informed of the reason for  needing reglan- they assured me it would be mailed in the next day or 2 and pt was informed. she was instructed to let us know if she has any problems Follow-up by: Willy Eddy, LPN,  February 23, 2010 11:14 AM

## 2010-08-31 NOTE — Medication Information (Signed)
Summary: Clarification for Metoclopramide/[facility]  Clarification for Metoclopramide/[facility]   Imported By: Sherian Rein 02/22/2010 09:55:48  _____________________________________________________________________  External Attachment:    Type:   Image     Comment:   External Document

## 2010-08-31 NOTE — Progress Notes (Signed)
Summary: mixup  Phone Note Call from Patient Call back at Home Phone [phone]   Caller: vm Summary of Call: Mixup in Rx have to have caps not tabs [facility] Test Caps?.  Call correct [phone] option 2 [facility].  Will not fill. Initial call taken by: Rudy Jew, RN,  Dec 23, 2009 3:22 PM    New/Updated Medications: HEMOCYTE PLUS 106-1 MG CAPS (B COMPLEX-C-MIN-FE-FA) 1 two times a day Prescriptions: HEMOCYTE PLUS 106-1 MG CAPS (B COMPLEX-C-MIN-FE-FA) 1 two times a day  #190 x 3   Entered by:   Willy Eddy, LPN   Authorized by:   Stacie Glaze MD   Signed by:   Willy Eddy, LPN on 30/86/5784   Method used:   Electronically to        [facility] (mail-order)             ,          Ph: [phone]       Fax: [phone]   RxID:   [id number]

## 2010-08-31 NOTE — Progress Notes (Signed)
Summary: FYI  Phone Note Call from Patient   Caller: Patient    [phone] Summary of Call: INFO ONLY--------Pt called to let Dr Lovell Sheehan know that she went to the ED over the weekend and was evaluated for sore throat, n/v..... Pt adv she was evaluated and released to go home... Pt states she feels better now but wanted to make Dr Lovell Sheehan aware that she had went to the ED... Pt denies any fever, adv sxs are getting better.  Initial call taken by: Debbra Riding,  June 14, 2010 8:15 AM  Follow-up for Phone Call        noted Follow-up by: Stacie Glaze MD,  June 14, 2010 7:33 PM

## 2010-08-31 NOTE — Miscellaneous (Signed)
Summary: refills  Clinical Lists Changes  Medications: Rx of PREVACID SOLUTAB 30 MG TBDP (LANSOPRAZOLE) dissolve 2  tablets under tongue  two times a day;  #360 x 3;  Signed;  Entered by: Chales Abrahams CMA (AAMA);  Authorized by: Rachael Fee MD;  Method used: Faxed to [facility] [place], , , [place]  , Ph: [phone], Fax: [phone] Rx of REGLAN 5 MG  TABS (METOCLOPRAMIDE HCL) Take 1 tablet by mouth twice a day;  #180 x 3;  Signed;  Entered by: Chales Abrahams CMA (AAMA);  Authorized by: Rachael Fee MD;  Method used: Faxed to [facility] [place], , , [place]  , Ph: [phone], Fax: [phone]    Prescriptions: REGLAN 5 MG  TABS (METOCLOPRAMIDE HCL) Take 1 tablet by mouth twice a day  #180 x 3   Entered by:   Chales Abrahams CMA (AAMA)   Authorized by:   Rachael Fee MD   Signed by:   Chales Abrahams CMA (AAMA) on 06/21/2010   Method used:   Faxed to ...       [facility] (mail-order)             , [place]         Ph: [phone]       Fax: [phone]   RxID:   [id number] PREVACID SOLUTAB 30 MG TBDP (LANSOPRAZOLE) dissolve 2  tablets under tongue  two times a day  #360 x 3   Entered by:   Chales Abrahams CMA (AAMA)   Authorized by:   Rachael Fee MD   Signed by:   Chales Abrahams CMA (AAMA) on 06/21/2010   Method used:   Faxed to ...       [facility] [place] (mail-order)             , [place]         Ph: [phone]       Fax: [phone]   RxID:   [id number]

## 2010-08-31 NOTE — Medication Information (Signed)
Summary: Coverage Approval for Zolpidem Tartrate  Coverage Approval for Zolpidem Tartrate   Imported By: Maryln Gottron 09/30/2009 15:45:32  _____________________________________________________________________  External Attachment:    Type:   Image     Comment:   External Document

## 2010-08-31 NOTE — Progress Notes (Signed)
Summary: rx change   Phone Note Call from Patient Call back at Home Phone [phone]   Caller: Patient Call For: Dr. Christella Hartigan Reason for Call: Talk to Nurse Summary of Call: needs rx changed to 90 day supply... Prevacid and Reglan... [facility] Initial call taken by: Vallarie Mare,  June 21, 2010 9:11 AM  Follow-up for Phone Call        pt informed that rx was sent to [facility] on 06/18/10.  She will call with any future problems Follow-up by: Chales Abrahams CMA Duncan Dull),  June 21, 2010 9:21 AM

## 2010-08-31 NOTE — Assessment & Plan Note (Signed)
Summary: COUGH, CONGESTION // RS   Vital Signs:  Patient profile:   77 year old female Weight:      132 pounds Temp:     98.1 degrees F oral BP sitting:   104 / 70  (right arm) Cuff size:   regular  Vitals Entered By: Duard Brady LPN (June 10, 2010 9:02 AM) CC: c/o cough and congestion Is Patient Diabetic? No   Primary Care Provider:  Peri Jefferson  CC:  c/o cough and congestion.  History of Present Illness:  77 year old patient who has a prior history of pneumonia presents with a one-week history of cough and congestion.  She had some initial fever that seems to have improved.  She has been using Mucinex to be with benefit.  She does have productive cough yielding low volume, yellow sputum.  Has had some increasing chest congestion, and mild shortness of breath..  She has treated hypertension, osteoporosis, B12 deficiency.  She has gastroesophageal reflux disease, and a prior history of aspiration pneumonia  Preventive Screening-Counseling & Management  Alcohol-Tobacco     Smoking Status: never  Allergies: 1)  Erythromycin Ethylsuccinate 2)  Prednisone (Prednisone)  Past History:  Past Medical History: Reviewed history from 06/14/2007 and no changes required. GERD Osteoporosis HRT Esophageal CA Anemia-iron deficiency Hypertension Allergic rhinitis  Family History: Reviewed history from 08/29/2008 and no changes required. mother died from cancer of cervix father had MI in 49 Family History of CAD Female 1st degree relative <50 No FH of Colon Cancer:  Review of Systems       The patient complains of anorexia, fever, hoarseness, and prolonged cough.  The patient denies weight loss, weight gain, vision loss, decreased hearing, chest pain, syncope, dyspnea on exertion, peripheral edema, headaches, hemoptysis, abdominal pain, melena, hematochezia, severe indigestion/heartburn, hematuria, incontinence, genital sores, muscle weakness, suspicious skin lesions,  transient blindness, difficulty walking, depression, unusual weight change, abnormal bleeding, enlarged lymph nodes, angioedema, and breast masses.    Physical Exam  General:  Well-developed,well-nourished,in no acute distress; alert,appropriate and cooperative throughout examination Head:  Normocephalic and atraumatic without obvious abnormalities. No apparent alopecia or balding. Eyes:  No corneal or conjunctival inflammation noted. EOMI. Perrla. Funduscopic exam benign, without hemorrhages, exudates or papilledema. Vision grossly normal. Ears:  External ear exam shows no significant lesions or deformities.  Otoscopic examination reveals clear canals, tympanic membranes are intact bilaterally without bulging, retraction, inflammation or discharge. Hearing is grossly normal bilaterally. Mouth:  Oral mucosa and oropharynx without lesions or exudates.  Teeth in good repair. Neck:  No deformities, masses, or tenderness noted. Lungs:  rales and rhonchi, involving the left lower lung field Heart:  Normal rate and regular rhythm. S1 and S2 normal without gallop, murmur, click, rub or other extra sounds. Abdomen:  Bowel sounds positive,abdomen soft and non-tender without masses, organomegaly or hernias noted. Msk:  No deformity or scoliosis noted of thoracic or lumbar spine.   Extremities:  No clubbing, cyanosis, edema, or deformity noted with normal full range of motion of all joints.     Impression & Recommendations:  Problem # 1:  ACUTE BRONCHITIS (ICD-466.0)  Her updated medication list for this problem includes:    Ventolin Hfa 108 (90 Base) Mcg/act Aers (Albuterol sulfate) .Marland Kitchen... 2 puffs q 4 hours as needed cough and wheeze    Azithromycin 250 Mg Tabs (Azithromycin) .Marland Kitchen..Marland Kitchen Two initially, then one daily for 4 additional days rule out left lower lobe pneumonia  Problem # 2:  COUGH (ICD-786.2)  Orders: T-2  View CXR (71020TC)  Problem # 3:  HYPERTENSION (ICD-401.9)  Her updated medication  list for this problem includes:    Ziac 2.5-6.25 Mg Tabs (Bisoprolol-hydrochlorothiazide) ..... Once daily  Complete Medication List: 1)  Promethegan 25 Mg Supp (Promethazine hcl) .... As needed 2)  Ambien 5 Mg Tabs (Zolpidem tartrate) .... As needed 3)  Ziac 2.5-6.25 Mg Tabs (Bisoprolol-hydrochlorothiazide) .... Once daily 4)  Reglan 5 Mg Tabs (Metoclopramide hcl) .... Take 1 tablet by mouth three times a day 5)  Carafate 1 Gm/48ml Susp (Sucralfate) .... Three times a day 6)  Xyzal 5 Mg Tabs (Levocetirizine dihydrochloride) .... Once daily 7)  Hemocyte Plus 106-1 Mg Caps (B complex-c-min-fe-fa) .Marland Kitchen.. 1 two times a day 8)  Viactiv 500-100-40 Chew (Calcium-vitamin d-vitamin k) .... 3 per day 9)  Vitamin D 09323 Unit Caps (Ergocalciferol) .... One by mouth weekly 10)  Prevacid Solutab 30 Mg Tbdp (Lansoprazole) .... Dissolve 2  tablets under tongue  two times a day 11)  Ventolin Hfa 108 (90 Base) Mcg/act Aers (Albuterol sulfate) .... 2 puffs q 4 hours as needed cough and wheeze 12)  Azithromycin 250 Mg Tabs (Azithromycin) .... Two initially, then one daily for 4 additional days  Patient Instructions: 1)  Get plenty of rest, drink lots of clear liquids, and use Tylenol or Ibuprofen for fever and comfort. Return in 7-10 days if you're not better:sooner if you're feeling worse. 2)  Take your antibiotic as prescribed until ALL of it is gone, but stop if you develop a rash or swelling and contact our office as soon as possible. Prescriptions: AZITHROMYCIN 250 MG TABS (AZITHROMYCIN) two initially, then one daily for 4 additional days  #6 x 0   Entered and Authorized by:   Gordy Savers  MD   Signed by:   Gordy Savers  MD on 06/10/2010   Method used:   Print then Give to Patient   RxID:   [id number] VENTOLIN HFA 108 (90 BASE) MCG/ACT AERS (ALBUTEROL SULFATE) 2 puffs q 4 hours as needed cough and wheeze  #1 x 0   Entered and Authorized by:   Gordy Savers  MD   Signed by:    Gordy Savers  MD on 06/10/2010   Method used:   Print then Give to Patient   RxID:   [id number]    Orders Added: 1)  Est. Patient Level IV [10626] 2)  T-2 View CXR [71020TC]

## 2010-08-31 NOTE — Assessment & Plan Note (Signed)
Summary: COUGH, CONGESTION // RS   Vital Signs:  Patient profile:   77 year old female Weight:      124 pounds Temp:     98.7 degrees F oral BP sitting:   120 / 72  (left arm) Cuff size:   regular  Vitals Entered By: Sid Falcon LPN (Dec 09, 2009 4:44 PM) CC: Cough, congestion, wheezing   History of Present Illness: Patient seen with respiratory illness which started couple days ago. She has cough productive of green sputum and intermittent wheezing. Had similar symptoms in past. Questionable low-grade fever earlier today but temperature not taken. No history of smoking. No hemoptysis. Minimal nasal congestion.  cough not improved with over-the-counter medications.  remote history of esophageal cancer. CT of chest last month revealed stable linear scarring in both bases. No acute findings.  Allergies: 1)  Erythromycin Ethylsuccinate 2)  Prednisone (Prednisone)  Past History:  Past Medical History: Last updated: 06/14/2007 GERD Osteoporosis HRT Esophageal CA Anemia-iron deficiency Hypertension Allergic rhinitis  Past Surgical History: Last updated: 08/29/2008 TAH + Left O Esphageal removal  for cancer,GASTRIC PULL- THRU 1993 Cholecystectomy Right arm fx Oophorectomy  Social History: Last updated: 08/29/2008 Retired  part-time works 1 day a week [employer] Single Never Smoked Drug use-no Alcohol Use - no Daily Caffeine Use PMH reviewed for relevance, SH/Risk Factors reviewed for relevance  Review of Systems  The patient denies anorexia, weight loss, hoarseness, chest pain, syncope, dyspnea on exertion, peripheral edema, prolonged cough, headaches, hemoptysis, and abdominal pain.    Physical Exam  General:  Well-developed,well-nourished,in no acute distress; alert,appropriate and cooperative throughout examination Ears:  External ear exam shows no significant lesions or deformities.  Otoscopic examination reveals clear canals, tympanic  membranes are intact bilaterally without bulging, retraction, inflammation or discharge. Hearing is grossly normal bilaterally. Nose:  External nasal examination shows no deformity or inflammation. Nasal mucosa are pink and moist without lesions or exudates. Mouth:  Oral mucosa and oropharynx without lesions or exudates.  Teeth in good repair. Neck:  No deformities, masses, or tenderness noted. Lungs:  patient has some diffuse wheezes but no retractions. She has some rales left base possibly related to old scarring. Heart:  normal rate and regular rhythm.   Extremities:  no edema Neurologic:  alert & oriented X3.   Psych:  good eye contact, not anxious appearing, and not depressed appearing.     Impression & Recommendations:  Problem # 1:  COUGH (ICD-786.2) Assessment New patient has evidence for reactive airway disease with significant wheezing.  O 2 sat 92 % but no resp distress.  history of frequent pneumonia in past. Depo-Medrol 80 mg. Sample Proventil inhaler as needed. Start Avelox 400 mg daily for 7 days. Depo- Medrol 80mg  (J1040) Prescription Created Electronically [phone]) Admin of Therapeutic Inj  intramuscular or subcutaneous (02725)  Complete Medication List: 1)  Promethegan 25 Mg Supp (Promethazine hcl) .... As needed 2)  Ambien 5 Mg Tabs (Zolpidem tartrate) .... As needed 3)  Ziac 2.5-6.25 Mg Tabs (Bisoprolol-hydrochlorothiazide) .... Once daily 4)  Reglan 5 Mg Tabs (Metoclopramide hcl) .... Take 1 tablet by mouth three times a day 5)  Carafate 1 Gm/57ml Susp (Sucralfate) .... Three times a day 6)  Xyzal 5 Mg Tabs (Levocetirizine dihydrochloride) .... Once daily 7)  Hemocyte 324 Mg Tabs (Ferrous fumarate) .... Two times a day 8)  Viactiv 500-100-40 Chew (Calcium-vitamin d-vitamin k) .... 3 per day 9)  Vitamin D 36644 Unit Caps (Ergocalciferol) .... One by mouth  weekly 10)  Prevacid Solutab 30 Mg Tbdp (Lansoprazole) .... Dissolve 2  tablets under tongue  two times a day 11)   Ventolin Hfa 108 (90 Base) Mcg/act Aers (Albuterol sulfate) .... 2 puffs q 4 hours as needed cough and wheeze 12)  Avelox 400 Mg Tabs (Moxifloxacin hcl) .... One by mouth once daily for 7 more days 13)  Hydrocodone-homatropine 5-1.5 Mg/12ml Syrp (Hydrocodone-homatropine) .... One tsp by mouth q 6 hours as needed cough  Patient Instructions: 1)  Start Avelox 400 mg one daily. 2)  Use ProAir 2 puffs every 4 hours as needed for cough and wheeze. 3)  Follow up promptly with Dr Lovell Sheehan if you note any increasing difficulty breathing or worsening fever. Prescriptions: HYDROCODONE-HOMATROPINE 5-1.5 MG/5ML SYRP (HYDROCODONE-HOMATROPINE) one tsp by mouth q 6 hours as needed cough  #120 ml x 0   Entered and Authorized by:   Evelena Peat MD   Signed by:   Evelena Peat MD on 12/09/2009   Method used:   Print then Give to Patient   RxID:   [id number] AVELOX 400 MG TABS (MOXIFLOXACIN HCL) one by mouth once daily for 7 more days  #7 x 0   Entered and Authorized by:   Evelena Peat MD   Signed by:   Evelena Peat MD on 12/09/2009   Method used:   Electronically to        [facility] [address] (retail)       [address] [place], [place]  [address]       Ph: [phone] or [phone]       Fax: [phone]   RxID:   [id number]    Medication Administration  Injection # 1:    Medication: Depo- Medrol 80mg     Diagnosis: COUGH (ICD-786.2)    Route: IM    Site: RUOQ gluteus    Exp Date: 06/01/2012    Lot #: [id number]    Mfr: Pharmacia    Patient tolerated injection without complications    Given by: Sid Falcon LPN (Dec 09, 2009 5:12 PM)  Orders Added: 1)  Depo- Medrol 80mg  [J1040] 2)  Prescription Created Electronically [G8553] 3)  Admin of Therapeutic Inj  intramuscular or subcutaneous [96372] 4)  Est. Patient Level IV [29518]

## 2010-08-31 NOTE — Assessment & Plan Note (Signed)
Summary: fever, nausea   Vital Signs:  Patient profile:   77 year old female Weight:      123 pounds O2 Sat:      95 % on Room air Temp:     99.4 degrees F oral BP sitting:   120 / 84  (left arm) Cuff size:   regular  Vitals Entered By: Sid Falcon LPN (September 24, 2009 11:10 AM)  O2 Flow:  Room air CC: fever, nausea   History of Present Illness: Patient seen as a work in with concerns for possible aspiration last evening. Long history of severe reflux. Sleeps sitting up. Also takes several medications including Prevacid twice daily, Carafate, and Reglan. Episode of reflux around 2 AM. This morning woke up with increased cough. Wheezing off and on. No fever thus far. Prior history reported aspiration pneumonia.  nonsmoker.  No dyspnea at rest.  Reflux generally controlled on multi-drug regimen.  Allergies: 1)  Erythromycin Ethylsuccinate 2)  Prednisone (Prednisone)  Past History:  Past Medical History: Last updated: 06/14/2007 GERD Osteoporosis HRT Esophageal CA Anemia-iron deficiency Hypertension Allergic rhinitis  Social History: Last updated: 08/29/2008 Retired  part-time works 1 day a week [employer] Single Never Smoked Drug use-no Alcohol Use - no Daily Caffeine Use PMH reviewed for relevance, PSH reviewed for relevance  Review of Systems  The patient denies fever, weight loss, chest pain, syncope, peripheral edema, prolonged cough, hemoptysis, anorexia, hoarseness, dyspnea on exertion, headaches, and abdominal pain.    Physical Exam  General:  Well-developed,well-nourished,in no acute distress; alert,appropriate and cooperative throughout examination Ears:  External ear exam shows no significant lesions or deformities.  Otoscopic examination reveals clear canals, tympanic membranes are intact bilaterally without bulging, retraction, inflammation or discharge. Hearing is grossly normal bilaterally. Mouth:  Oral mucosa and oropharynx  without lesions or exudates.  Teeth in good repair. Neck:  No deformities, masses, or tenderness noted. Lungs:  patient has some diffuse wheezes. No rales. No retractions. Heart:  normal rate and regular rhythm.   Extremities:  no edema. Neurologic:  alert & oriented X3 and strength normal in all extremities.   Cervical Nodes:  No lymphadenopathy noted Psych:  normally interactive, good eye contact, not anxious appearing, and not depressed appearing.     Impression & Recommendations:  Problem # 1:  COUGH (ICD-786.2)  patient has increased cough after severe reflux episode. High risk for aspiration pneumonia. She has evidence for reactive airway issues today. Depo-Medrol will be given and go ahead with her age and risk for aspiration start antibiotic.  Orders: Depo- Medrol 80mg  (J1040) Admin of Therapeutic Inj  intramuscular or subcutaneous ([address]109)  Problem # 2:  GERD (ICD-530.81)  Her updated medication list for this problem includes:    Carafate 1 Gm/39ml Susp (Sucralfate) .Marland Kitchen... Three times a day    Prevacid Solutab 30 Mg Tbdp (Lansoprazole) .Marland Kitchen... Dissolve 2  tablets under tongue  two times a day  Complete Medication List: 1)  Promethegan 25 Mg Supp (Promethazine hcl) .... As needed 2)  Ambien 5 Mg Tabs (Zolpidem tartrate) .... As needed 3)  Ziac 2.5-6.25 Mg Tabs (Bisoprolol-hydrochlorothiazide) .... Once daily 4)  Reglan 5 Mg Tabs (Metoclopramide hcl) .... Take 1 tablet by mouth three times a day 5)  Carafate 1 Gm/45ml Susp (Sucralfate) .... Three times a day 6)  Xyzal 5 Mg Tabs (Levocetirizine dihydrochloride) .... Once daily 7)  Hemocyte 324 Mg Tabs (Ferrous fumarate) .... Two times a day 8)  Viactiv 500-100-40 Chew (Calcium-vitamin d-vitamin  k) .... 3 per day 9)  Vitamin D [address] Unit Caps (Ergocalciferol) .... One by mouth weekly 10)  Prevacid Solutab 30 Mg Tbdp (Lansoprazole) .... Dissolve 2  tablets under tongue  two times a day 11)  Avelox 400 Mg Tabs (Moxifloxacin hcl)  .... One by mouth once daily for 7 days 12)  Ventolin Hfa 108 (90 Base) Mcg/act Aers (Albuterol sulfate) .... 2 puffs q 4 hours as needed cough and wheeze  Patient Instructions: 1)  Followup immediately if you develop any increased fever or breathing difficulties 2)  Take your antibiotic as prescribed until ALL of it is gone, but stop if you develop a rash or swelling and contact our office as soon as possible.  Prescriptions: VENTOLIN HFA 108 (90 BASE) MCG/ACT AERS (ALBUTEROL SULFATE) 2 puffs q 4 hours as needed cough and wheeze  #1 x 0   Entered and Authorized by:   Evelena Peat MD   Signed by:   Evelena Peat MD on 09/24/2009   Method used:   Electronically to        [facility] [address] (retail)       [address] [place], [place]  [address]       Ph: [phone] or [phone]       Fax: [phone]   RxID:   [id number] AVELOX 400 MG TABS (MOXIFLOXACIN HCL) one by mouth once daily for 7 days  #7 x 0   Entered and Authorized by:   Evelena Peat MD   Signed by:   Evelena Peat MD on 09/24/2009   Method used:   Electronically to        [facility] [address] (retail)       [address] [place], [place]  [address]       Ph: [phone] or [phone]       Fax: [phone]   RxID:   [id number]    Medication Administration  Injection # 1:    Medication: Depo- Medrol 80mg     Diagnosis: COUGH (ICD-786.2)    Route: IM    Site: RUOQ gluteus    Exp Date: 06/01/2012    Lot #: [id number]    Mfr: Pharmacia    Patient tolerated injection without complications    Given by: Sid Falcon LPN (September 24, 2009 12:00 PM)  Orders Added: 1)  Est. Patient Level IV [02542] 2)  Depo- Medrol 80mg  [J1040] 3)  Admin of Therapeutic Inj  intramuscular or subcutaneous [70623]

## 2010-08-31 NOTE — Progress Notes (Signed)
Summary: appt today  Phone Note Call from Patient   Caller: Patient Call For: Stacie Glaze MD Summary of Call: C/o sick with vomiting since 2am- says it's from GERD, nausea, slight fever. Wants appt. Initial call taken by: Raechel Ache, RN,  September 24, 2009 8:38 AM  Follow-up for Phone Call        appt Dr Caryl Never this am.

## 2010-08-31 NOTE — Progress Notes (Signed)
Summary: please return call  Phone Note Call from Patient Call back at Home Phone [phone]   Caller: Patient--live call Reason for Call: Acute Illness, Talk to Nurse Summary of Call: needs status of a refill that Bonnye was going to check for her. please return her call. Initial call taken by: Warnell Forester,  February 22, 2010 8:05 AM  Follow-up for Phone Call        pt informed it was refilled Follow-up by: Willy Eddy, LPN,  February 22, 2010 8:18 AM

## 2010-08-31 NOTE — Letter (Signed)
Summary: Results Letter   Gastroenterology  [address] [place], [place] [address]   Phone: [phone]  Fax: [phone]        July 08, 2010 MRN: [id number]    [name]  ID # [id number]  Case # [id number] [address] [place], [place]  [address] [phone] 09/01/1928  To whom it may concern:   Prevacid Tab RAP was denied due to GERD not being added on the Prior auth form as a diagnosis.  This information was left out inadvertently.  The patient does indeed have a history of esophageal cancer as well as GERD.  Diagnosis code 530.81.  Please review this matter as an expedtited case the patient will be out of her medication.  Thank you for your time.      Sincerely,      Dr Rob Bunting Chales Abrahams CMA [facility])  This letter has been electronically signed by your physician.  Appended Document: Results Letter faxed to [facility]

## 2010-08-31 NOTE — Assessment & Plan Note (Signed)
Summary: 3 month follow up/cjr   Vital Signs:  Patient profile:   77 year old female Height:      63 inches Weight:      124 pounds BMI:     22.05 Temp:     98.2 degrees F oral Pulse rate:   72 / minute Resp:     14 per minute BP sitting:   120 / 80  (left arm)  Vitals Entered By: Willy Eddy, LPN (November 03, 5407 9:23 AM) CC: last vitamin d level 3-10--has been taking twice a wekk of 50, 000 since then- wants refill, Hypertension Management   Primary Care Provider:  Peri Jefferson  CC:  last vitamin d level 3-10--has been taking twice a wekk of 50, 000 since then- wants refill, and Hypertension Management.  History of Present Illness: Doing ok on the generic prevacid due to prior esophageal replacement  Jan 8th 1993 HTN stable. Last CT of the chest many years ago Recruitment consultant was gerhart)  Hypertension History:      She denies headache, chest pain, palpitations, dyspnea with exertion, orthopnea, PND, peripheral edema, visual symptoms, neurologic problems, syncope, and side effects from treatment.        Positive major cardiovascular risk factors include female age 29 years old or older, hypertension, and family history for ischemic heart disease (males less than 65 years old).  Negative major cardiovascular risk factors include non-tobacco-user status.     Preventive Screening-Counseling & Management  Alcohol-Tobacco     Smoking Status: never     Passive Smoke Exposure: yes  Current Problems (verified): 1)  Cough  (ICD-786.2) 2)  Personal History of Colonic Polyps  (ICD-V12.72) 3)  Hx of Esophageal Cancer  (ICD-V10.03) 4)  Gerd  (ICD-530.81) 5)  Aspiration Pneumonia, Right Lower Lobe  (ICD-507.0) 6)  Acute Bronchitis  (ICD-466.0) 7)  Family History of Cad Female 1st Degree Relative <50  (ICD-V17.3) 8)  Allergic Rhinitis  (ICD-477.9) 9)  Hx of Neoplasm, Malignant, Esophagus  (ICD-150.9) 10)  Hypertension  (ICD-401.9) 11)  Anemia-iron Deficiency  (ICD-280.9) 12)  B12  Deficiency  (ICD-266.2) 13)  Osteoporosis  (ICD-733.00) 14)  Gerd  (ICD-530.81)  Current Medications (verified): 1)  Promethegan 25 Mg  Supp (Promethazine Hcl) .... As Needed 2)  Ambien 5 Mg  Tabs (Zolpidem Tartrate) .... As Needed 3)  Ziac 2.5-6.25 Mg  Tabs (Bisoprolol-Hydrochlorothiazide) .... Once Daily 4)  Reglan 5 Mg  Tabs (Metoclopramide Hcl) .... Take 1 Tablet By Mouth Three Times A Day 5)  Carafate 1 Gm/75ml  Susp (Sucralfate) .... Three Times A Day 6)  Xyzal 5 Mg  Tabs (Levocetirizine Dihydrochloride) .... Once Daily 7)  Hemocyte 324 Mg  Tabs (Ferrous Fumarate) .... Two Times A Day 8)  Viactiv 500-100-40  Chew (Calcium-Vitamin D-Vitamin K) .... 3 Per Day 9)  Vitamin D 81191 Unit  Caps (Ergocalciferol) .... One By Mouth Weekly 10)  Prevacid Solutab 30 Mg Tbdp (Lansoprazole) .... Dissolve 2  Tablets Under Tongue  Two Times A Day 11)  Ventolin Hfa 108 (90 Base) Mcg/act Aers (Albuterol Sulfate) .... 2 Puffs Q 4 Hours As Needed Cough and Wheeze  Allergies: 1)  Erythromycin Ethylsuccinate 2)  Prednisone (Prednisone)  Past History:  Family History: Last updated: 09-28-2008 mother died from cancer of cervix father had MI in 19 Family History of CAD Female 1st degree relative <50 No FH of Colon Cancer:  Social History: Last updated: 09/28/2008 Retired  part-time works 1 day a week [employer]  [employer] Single Never Smoked Drug use-no Alcohol Use - no Daily Caffeine Use  Risk Factors: Caffeine Use: 1 (08/29/2008)  Risk Factors: Smoking Status: never (11/02/2009) Passive Smoke Exposure: yes (11/02/2009)  Past medical, surgical, family and social histories (including risk factors) reviewed, and no changes noted (except as noted below).  Past Medical History: Reviewed history from 06/14/2007 and no changes required. GERD Osteoporosis HRT Esophageal CA Anemia-iron deficiency Hypertension Allergic rhinitis  Past Surgical History: Reviewed history from  08/29/2008 and no changes required. TAH + Left O Esphageal removal  for cancer,GASTRIC PULL- THRU 1993 Cholecystectomy Right arm fx Oophorectomy  Family History: Reviewed history from 08/29/2008 and no changes required. mother died from cancer of cervix father had MI in 51 Family History of CAD Female 1st degree relative <50 No FH of Colon Cancer:  Social History: Reviewed history from 08/29/2008 and no changes required. Retired  part-time works 1 day a week [employer] Single Never Smoked Drug use-no Alcohol Use - no Daily Caffeine Use  Review of Systems  The patient denies anorexia, fever, weight loss, weight gain, vision loss, decreased hearing, hoarseness, chest pain, syncope, dyspnea on exertion, peripheral edema, prolonged cough, headaches, hemoptysis, abdominal pain, melena, hematochezia, severe indigestion/heartburn, hematuria, incontinence, genital sores, muscle weakness, suspicious skin lesions, transient blindness, difficulty walking, depression, unusual weight change, abnormal bleeding, enlarged lymph nodes, angioedema, and breast masses.    Physical Exam  General:  Well-developed,well-nourished,in no acute distress; alert,appropriate and cooperative throughout examination Head:  Normocephalic and atraumatic. Ears:  R ear normal and L ear normal.   Nose:  no external deformity and no nasal discharge.   Mouth:  good dentition and pharynx pink and moist.   Neck:  No deformities, masses, or tenderness noted. Lungs:  normal respiratory effort and no wheezes.   Heart:  normal rate and regular rhythm.   Abdomen:  soft, non-tender, and bowel sounds hypoactive.   Msk:  No deformity or scoliosis noted of thoracic or lumbar spine.   Pulses:  R and L carotid,radial,femoral,dorsalis pedis and posterior tibial pulses are full and equal bilaterally Extremities:  No clubbing, cyanosis, edema, or deformity noted with normal full range of motion of all joints.      Impression & Recommendations:  Problem # 1:  COUGH (ICD-786.2)  chronic cough due to the gerd  Orders:  CT of chest with contrast Radiology Referral (Radiology)  Problem # 2:  HX OF ESOPHAGEAL CANCER (ICD-V10.03) ct of chest with contrast last screening was 2006 ( greater that 5 years)  Orders: Radiology Referral (Radiology)  Problem # 3:  HYPERTENSION (ICD-401.9)  Her updated medication list for this problem includes:    Ziac 2.5-6.25 Mg Tabs (Bisoprolol-hydrochlorothiazide) ..... Once daily  Orders: Venipuncture ([address]) TLB-BMP (Basic Metabolic Panel-BMET) (80048-METABOL)  BP today: 120/80 Prior BP: 120/84 (09/24/2009)  10 Yr Risk Heart Disease: 9 % Prior 10 Yr Risk Heart Disease: 6 % (08/03/2009)  Labs Reviewed: K+: 4.4 (06/11/2008) Creat: : 0.9 (06/11/2008)   Chol: 193 (09/07/2006)   HDL: 59.4 (09/07/2006)   LDL: 116 (09/07/2006)   TG: 90 (09/07/2006)  Problem # 4:  B12 DEFICIENCY (ICD-266.2) measuring anemia labs Orders: TLB-B12 + Folate Pnl (82746_82607-B12/FOL)  Problem # 5:  OSTEOPOROSIS (ICD-733.00) on vitamin d at high level due to GI surgery hx Orders: T-Vitamin D (25-Hydroxy) (60454-09811) TLB-Calcium (82310-CA)  Discussed medication use, applications of heat or ice, and exercises.   Complete Medication List: 1)  Promethegan 25 Mg Supp (Promethazine hcl) .... As needed 2)  Ambien 5 Mg Tabs (Zolpidem tartrate) .... As needed 3)  Ziac 2.5-6.25 Mg Tabs (Bisoprolol-hydrochlorothiazide) .... Once daily 4)  Reglan 5 Mg Tabs (Metoclopramide hcl) .... Take 1 tablet by mouth three times a day 5)  Carafate 1 Gm/30ml Susp (Sucralfate) .... Three times a day 6)  Xyzal 5 Mg Tabs (Levocetirizine dihydrochloride) .... Once daily 7)  Hemocyte 324 Mg Tabs (Ferrous fumarate) .... Two times a day 8)  Viactiv 500-100-40 Chew (Calcium-vitamin d-vitamin k) .... 3 per day 9)  Vitamin D 62130 Unit Caps (Ergocalciferol) .... One by mouth weekly 10)  Prevacid Solutab  30 Mg Tbdp (Lansoprazole) .... Dissolve 2  tablets under tongue  two times a day 11)  Ventolin Hfa 108 (90 Base) Mcg/act Aers (Albuterol sulfate) .... 2 puffs q 4 hours as needed cough and wheeze  Other Orders: TLB-CBC Platelet - w/Differential (85025-CBCD) TLB-IBC Pnl (Iron/FE;Transferrin) (83550-IBC) Prescription Created Electronically [phone])  Hypertension Assessment/Plan:      The patient's hypertensive risk group is category B: At least one risk factor (excluding diabetes) with no target organ damage.  Her calculated 10 year risk of coronary heart disease is 9 %.  Today's blood pressure is 120/80.  Her blood pressure goal is < 140/90.  Patient Instructions: 1)  Please schedule a follow-up appointment in 3 months. Prescriptions: CARAFATE 1 GM/10ML  SUSP (SUCRALFATE) three times a day  #84 oz x 3   Entered by:   Willy Eddy, LPN   Authorized by:   Stacie Glaze MD   Signed by:   Willy Eddy, LPN on 46/96/2952   Method used:   Printed then faxed to ...       [facility] [address] (retail)       [address] [place], [place]  [address]       Ph: [phone] or [phone]       Fax: [phone]   RxID:   [id number] AMBIEN 5 MG  TABS (ZOLPIDEM TARTRATE) as needed  #90 x 3   Entered by:   Willy Eddy, LPN   Authorized by:   Stacie Glaze MD   Signed by:   Willy Eddy, LPN on 16/08/930   Method used:   Printed then faxed to ...       [facility] [address] (retail)       [address] [place], [place]  [address]       Ph: [phone] or [phone]       Fax: [phone]   RxID:   [id number]   Appended Document: Orders Update     Clinical Lists Changes  Orders: Added new Service order of Vit B12 1000 mcg (X3818) - Signed Added new Service order of Admin of Therapeutic Inj  intramuscular or subcutaneous (29937) - Signed       Medication Administration  Injection # 1:     Medication: Vit B12 1000 mcg    Diagnosis: B12 DEFICIENCY (ICD-266.2)    Route: IM    Site: L deltoid    Exp Date: 04/30/2011    Lot #: [id number]    Mfr: [employer]ent    Patient tolerated injection without complications    Given by: Willy Eddy, LPN (November 03, 7891 10:23 AM)  Orders Added: 1)  Vit B12  1000 mcg [J3420] 2)  Admin of Therapeutic Inj  intramuscular or subcutaneous [16109]

## 2010-08-31 NOTE — Assessment & Plan Note (Signed)
Summary: 3 month fup//ccm/pt rescd from bump//ccm   Vital Signs:  Patient profile:   77 year old female Height:      63 inches Weight:      125 pounds BMI:     22.22 Temp:     98.2 degrees F oral Pulse rate:   72 / minute Resp:     14 per minute BP sitting:   114 / 72  (left arm)  Vitals Entered By: Willy Eddy, LPN (February 09, 2010 9:25 AM) CC: roa, Hypertension Management   Primary Care Provider:  Peri Jefferson  CC:  roa and Hypertension Management.  History of Present Illness: moderate worsening of tremor with reglan  some parkenson's side effect, no tongue trusting propulsid worked best     Hypertension History:      She denies headache, chest pain, palpitations, dyspnea with exertion, orthopnea, PND, peripheral edema, visual symptoms, neurologic problems, syncope, and side effects from treatment.        Positive major cardiovascular risk factors include female age 9 years old or older, hypertension, and family history for ischemic heart disease (males less than 50 years old).  Negative major cardiovascular risk factors include non-tobacco-user status.     Preventive Screening-Counseling & Management  Alcohol-Tobacco     Smoking Status: never     Passive Smoke Exposure: yes  Problems Prior to Update: 1)  Cough  (ICD-786.2) 2)  Personal History of Colonic Polyps  (ICD-V12.72) 3)  Hx of Esophageal Cancer  (ICD-V10.03) 4)  Gerd  (ICD-530.81) 5)  Aspiration Pneumonia, Right Lower Lobe  (ICD-507.0) 6)  Acute Bronchitis  (ICD-466.0) 7)  Family History of Cad Female 1st Degree Relative <50  (ICD-V17.3) 8)  Allergic Rhinitis  (ICD-477.9) 9)  Hx of Neoplasm, Malignant, Esophagus  (ICD-150.9) 10)  Hypertension  (ICD-401.9) 11)  Anemia-iron Deficiency  (ICD-280.9) 12)  B12 Deficiency  (ICD-266.2) 13)  Osteoporosis  (ICD-733.00) 14)  Gerd  (ICD-530.81)  Current Problems (verified): 1)  Cough  (ICD-786.2) 2)  Personal History of Colonic Polyps  (ICD-V12.72) 3)   Hx of Esophageal Cancer  (ICD-V10.03) 4)  Gerd  (ICD-530.81) 5)  Aspiration Pneumonia, Right Lower Lobe  (ICD-507.0) 6)  Acute Bronchitis  (ICD-466.0) 7)  Family History of Cad Female 1st Degree Relative <50  (ICD-V17.3) 8)  Allergic Rhinitis  (ICD-477.9) 9)  Hx of Neoplasm, Malignant, Esophagus  (ICD-150.9) 10)  Hypertension  (ICD-401.9) 11)  Anemia-iron Deficiency  (ICD-280.9) 12)  B12 Deficiency  (ICD-266.2) 13)  Osteoporosis  (ICD-733.00) 14)  Gerd  (ICD-530.81)  Medications Prior to Update: 1)  Promethegan 25 Mg  Supp (Promethazine Hcl) .... As Needed 2)  Ambien 5 Mg  Tabs (Zolpidem Tartrate) .... As Needed 3)  Ziac 2.5-6.25 Mg  Tabs (Bisoprolol-Hydrochlorothiazide) .... Once Daily 4)  Reglan 5 Mg  Tabs (Metoclopramide Hcl) .... Take 1 Tablet By Mouth Three Times A Day 5)  Carafate 1 Gm/19ml  Susp (Sucralfate) .... Three Times A Day 6)  Xyzal 5 Mg  Tabs (Levocetirizine Dihydrochloride) .... Once Daily 7)  Hemocyte Plus 106-1 Mg Caps (B Complex-C-Min-Fe-Fa) .Marland Kitchen.. 1 Two Times A Day 8)  Viactiv 500-100-40  Chew (Calcium-Vitamin D-Vitamin K) .... 3 Per Day 9)  Vitamin D 16109 Unit  Caps (Ergocalciferol) .... One By Mouth Weekly 10)  Prevacid Solutab 30 Mg Tbdp (Lansoprazole) .... Dissolve 2  Tablets Under Tongue  Two Times A Day 11)  Ventolin Hfa 108 (90 Base) Mcg/act Aers (Albuterol Sulfate) .... 2 Puffs Q 4 Hours As  Needed Cough and Wheeze  Current Medications (verified): 1)  Promethegan 25 Mg  Supp (Promethazine Hcl) .... As Needed 2)  Ambien 5 Mg  Tabs (Zolpidem Tartrate) .... As Needed 3)  Ziac 2.5-6.25 Mg  Tabs (Bisoprolol-Hydrochlorothiazide) .... Once Daily 4)  Reglan 5 Mg  Tabs (Metoclopramide Hcl) .... Take 1 Tablet By Mouth Three Times A Day 5)  Carafate 1 Gm/53ml  Susp (Sucralfate) .... Three Times A Day 6)  Xyzal 5 Mg  Tabs (Levocetirizine Dihydrochloride) .... Once Daily 7)  Hemocyte Plus 106-1 Mg Caps (B Complex-C-Min-Fe-Fa) .Marland Kitchen.. 1 Two Times A Day 8)  Viactiv 500-100-40   Chew (Calcium-Vitamin D-Vitamin K) .... 3 Per Day 9)  Vitamin D 04540 Unit  Caps (Ergocalciferol) .... One By Mouth Weekly 10)  Prevacid Solutab 30 Mg Tbdp (Lansoprazole) .... Dissolve 2  Tablets Under Tongue  Two Times A Day 11)  Ventolin Hfa 108 (90 Base) Mcg/act Aers (Albuterol Sulfate) .... 2 Puffs Q 4 Hours As Needed Cough and Wheeze  Allergies (verified): 1)  Erythromycin Ethylsuccinate 2)  Prednisone (Prednisone)  Past History:  Family History: Last updated: 2008-09-26 mother died from cancer of cervix father had MI in 40 Family History of CAD Female 1st degree relative <50 No FH of Colon Cancer:  Social History: Last updated: September 26, 2008 Retired  part-time works 1 day a week [employer] Single Never Smoked Drug use-no Alcohol Use - no Daily Caffeine Use  Risk Factors: Caffeine Use: 1 (Sep 26, 2008)  Risk Factors: Smoking Status: never (02/09/2010) Passive Smoke Exposure: yes (02/09/2010)  Past medical, surgical, family and social histories (including risk factors) reviewed, and no changes noted (except as noted below).  Past Medical History: Reviewed history from 06/14/2007 and no changes required. GERD Osteoporosis HRT Esophageal CA Anemia-iron deficiency Hypertension Allergic rhinitis  Past Surgical History: Reviewed history from 09/26/2008 and no changes required. TAH + Left O Esphageal removal  for cancer,GASTRIC PULL- THRU 1993 Cholecystectomy Right arm fx Oophorectomy  Family History: Reviewed history from Sep 26, 2008 and no changes required. mother died from cancer of cervix father had MI in 25 Family History of CAD Female 1st degree relative <50 No FH of Colon Cancer:  Social History: Reviewed history from 2008-09-26 and no changes required. Retired  part-time works 1 day a week [employer] Single Never Smoked Drug use-no Alcohol Use - no Daily Caffeine Use  Review of Systems       The patient complains  of hoarseness and dyspnea on exertion.  The patient denies anorexia, fever, weight loss, weight gain, vision loss, decreased hearing, chest pain, syncope, peripheral edema, prolonged cough, headaches, hemoptysis, abdominal pain, melena, hematochezia, severe indigestion/heartburn, hematuria, incontinence, genital sores, muscle weakness, suspicious skin lesions, transient blindness, difficulty walking, depression, unusual weight change, abnormal bleeding, enlarged lymph nodes, angioedema, and breast masses.    Physical Exam  General:  Well-developed,well-nourished,in no acute distress; alert,appropriate and cooperative throughout examination Head:  Normocephalic and atraumatic. Ears:  R ear normal and L ear normal.   Nose:  no external deformity and no nasal discharge.   Mouth:  good dentition.   Neck:  No deformities, masses, or tenderness noted. Lungs:  normal respiratory effort, R base crackles, and L base crackles.   Heart:  normal rate and regular rhythm.   Abdomen:  soft, non-tender, and bowel sounds hypoactive.   Msk:  No deformity or scoliosis noted of thoracic or lumbar spine.   Extremities:  no edema Neurologic:  alert & oriented X3.   hand tremor  Impression & Recommendations:  Problem # 1:  ASPIRATION PNEUMONIA, RIGHT LOWER LOBE (ICD-507.0) chronic low grad aspiration mucinex and moniter for fever  Problem # 2:  HYPERTENSION (ICD-401.9)  Her updated medication list for this problem includes:    Ziac 2.5-6.25 Mg Tabs (Bisoprolol-hydrochlorothiazide) ..... Once daily  BP today: 114/72 Prior BP: 120/72 (12/09/2009)  10 Yr Risk Heart Disease: 6 % Prior 10 Yr Risk Heart Disease: 9 % (11/02/2009)  Labs Reviewed: K+: 3.9 (11/02/2009) Creat: : 0.7 (11/02/2009)   Chol: 193 (09/07/2006)   HDL: 59.4 (09/07/2006)   LDL: 116 (09/07/2006)   TG: 90 (09/07/2006)  Problem # 3:  ANEMIA-IRON DEFICIENCY (ICD-280.9)  Hgb: 14.5 (11/02/2009)   Hct: 42.8 (11/02/2009)   Platelets: 230.0  (11/02/2009) RBC: 4.78 (11/02/2009)   RDW: 15.7 (11/02/2009)   WBC: 6.6 (11/02/2009) MCV: 89.5 (11/02/2009)   MCHC: 33.9 (11/02/2009) Iron: 60 (11/02/2009)   % Sat: 18.7 (11/02/2009) B12: 639 (11/02/2009)   Folate: 18.1 (11/02/2009)   TSH: 1.12 (09/07/2006)  Orders: Venipuncture (16109) TLB-IBC Pnl (Iron/FE;Transferrin) (83550-IBC) TLB-CBC Platelet - w/Differential (85025-CBCD)  Problem # 4:  B12 DEFICIENCY (ICD-266.2)  Orders: TLB-B12 + Folate Pnl (60454_09811-B14/NWG)  Complete Medication List: 1)  Promethegan 25 Mg Supp (Promethazine hcl) .... As needed 2)  Ambien 5 Mg Tabs (Zolpidem tartrate) .... As needed 3)  Ziac 2.5-6.25 Mg Tabs (Bisoprolol-hydrochlorothiazide) .... Once daily 4)  Reglan 5 Mg Tabs (Metoclopramide hcl) .... Take 1 tablet by mouth three times a day 5)  Carafate 1 Gm/64ml Susp (Sucralfate) .... Three times a day 6)  Xyzal 5 Mg Tabs (Levocetirizine dihydrochloride) .... Once daily 7)  Hemocyte Plus 106-1 Mg Caps (B complex-c-min-fe-fa) .Marland Kitchen.. 1 two times a day 8)  Viactiv 500-100-40 Chew (Calcium-vitamin d-vitamin k) .... 3 per day 9)  Vitamin D 95621 Unit Caps (Ergocalciferol) .... One by mouth weekly 10)  Prevacid Solutab 30 Mg Tbdp (Lansoprazole) .... Dissolve 2  tablets under tongue  two times a day 11)  Ventolin Hfa 108 (90 Base) Mcg/act Aers (Albuterol sulfate) .... 2 puffs q 4 hours as needed cough and wheeze  Other Orders: T-Vitamin D (25-Hydroxy) (30865-78469)  Hypertension Assessment/Plan:      The patient's hypertensive risk group is category B: At least one risk factor (excluding diabetes) with no target organ damage.  Her calculated 10 year risk of coronary heart disease is 6 %.  Today's blood pressure is 114/72.  Her blood pressure goal is < 140/90.  Patient Instructions: 1)  Please schedule a follow-up appointment in 3 months.

## 2010-08-31 NOTE — Medication Information (Signed)
Summary: Duration clarification/[facility]  Duration clarification/[facility]   Imported By: Lester Silverdale 06/25/2010 12:06:59  _____________________________________________________________________  External Attachment:    Type:   Image     Comment:   External Document

## 2010-08-31 NOTE — Medication Information (Signed)
Summary: Prior Autho & Denied for Prevacid/[facility]  Prior Autho & Denied for Prevacid/[facility]   Imported By: Sherian Rein 07/08/2010 10:49:52  _____________________________________________________________________  External Attachment:    Type:   Image     Comment:   External Document

## 2010-08-31 NOTE — Progress Notes (Signed)
   Phone Note From Other Clinic   Summary of Call: I spoke with Dr. Lovell Sheehan about her reglan.  she is on 5 three times a day and has been for many, many years without problems.  The risk of TD is VERY low at this dose and she really has symptomatic relief on the relgan.  When she is off she feels much worse.  I am happy to speak to [facility],write letter if needed. Initial call taken by: Rachael Fee MD,  February 18, 2010 8:22 AM

## 2010-08-31 NOTE — Progress Notes (Signed)
Summary: Medication   Phone Note Call from Patient Call back at Home Phone [phone]   Caller: Patient Call For: Dr. Christella Hartigan Reason for Call: Talk to Nurse Details for Reason: Medication Summary of Call: Pt. called to follow-up on the preauthorization for Prevacid. She will be running out of meds soon and needs to know tohe status.  Please call and advise. Initial call taken by: Schuyler Amor,  July 08, 2010 1:58 PM  Follow-up for Phone Call        pt notified that the appeal letter was faxed this morning and it may take a day pt two before we get an answer. Follow-up by: Chales Abrahams CMA Duncan Dull),  July 08, 2010 2:10 PM

## 2010-08-31 NOTE — Assessment & Plan Note (Signed)
Summary: 3 mo rov/mm   Vital Signs:  Patient profile:   77 year old female Height:      63 inches Weight:      124 pounds BMI:     22.05 Temp:     98.2 degrees F oral Pulse rate:   72 / minute Resp:     14 per minute BP sitting:   106 / 70  (left arm)  Vitals Entered By: Willy Eddy, LPN (August 03, 2009 8:13 AM) CC: 3 mkonth rov- c/o chest congestion, Hypertension Management   Primary Care Provider:  Peri Jefferson  CC:  3 mkonth rov- c/o chest congestion and Hypertension Management.  History of Present Illness: had a mild flair after eating greens for new years but this has resolved ( two to three hours of nausea and vomiting) she has returned to a bland diet, mild aspriration and has some color tro her suptum ( she has a hx of aspiration pnemonia.   Hypertension History:      She denies headache, chest pain, palpitations, dyspnea with exertion, orthopnea, PND, peripheral edema, visual symptoms, neurologic problems, syncope, and side effects from treatment.        Positive major cardiovascular risk factors include female age 27 years old or older, hypertension, and family history for ischemic heart disease (males less than 72 years old).  Negative major cardiovascular risk factors include non-tobacco-user status.     Preventive Screening-Counseling & Management  Alcohol-Tobacco     Smoking Status: never     Passive Smoke Exposure: yes  Problems Prior to Update: 1)  Personal History of Colonic Polyps  (ICD-V12.72) 2)  Hx of Esophageal Cancer  (ICD-V10.03) 3)  Gerd  (ICD-530.81) 4)  Aspiration Pneumonia, Right Lower Lobe  (ICD-507.0) 5)  Acute Bronchitis  (ICD-466.0) 6)  Family History of Cad Female 1st Degree Relative <50  (ICD-V17.3) 7)  Allergic Rhinitis  (ICD-477.9) 8)  Hx of Neoplasm, Malignant, Esophagus  (ICD-150.9) 9)  Hypertension  (ICD-401.9) 10)  Anemia-iron Deficiency  (ICD-280.9) 11)  B12 Deficiency  (ICD-266.2) 12)  Osteoporosis   (ICD-733.00) 13)  Gerd  (ICD-530.81)  Medications Prior to Update: 1)  Promethegan 25 Mg  Supp (Promethazine Hcl) .... As Needed 2)  Ambien 5 Mg  Tabs (Zolpidem Tartrate) .... As Needed 3)  Ziac 2.5-6.25 Mg  Tabs (Bisoprolol-Hydrochlorothiazide) .... Once Daily 4)  Reglan 5 Mg  Tabs (Metoclopramide Hcl) .... Take 1 Tablet By Mouth Three Times A Day 5)  Carafate 1 Gm/36ml  Susp (Sucralfate) .... Three Times A Day 6)  Xyzal 5 Mg  Tabs (Levocetirizine Dihydrochloride) .... Once Daily 7)  Hemocyte 324 Mg  Tabs (Ferrous Fumarate) .... Two Times A Day 8)  Viactiv 500-100-40  Chew (Calcium-Vitamin D-Vitamin K) .... 3 Per Day 9)  Vitamin D 09811 Unit  Caps (Ergocalciferol) .... One By Mouth Weekly 10)  Prevacid Solutab 30 Mg Tbdp (Lansoprazole) .... Dissolve 2  Tablets Under Tongue  Two Times A Day  Current Medications (verified): 1)  Promethegan 25 Mg  Supp (Promethazine Hcl) .... As Needed 2)  Ambien 5 Mg  Tabs (Zolpidem Tartrate) .... As Needed 3)  Ziac 2.5-6.25 Mg  Tabs (Bisoprolol-Hydrochlorothiazide) .... Once Daily 4)  Reglan 5 Mg  Tabs (Metoclopramide Hcl) .... Take 1 Tablet By Mouth Three Times A Day 5)  Carafate 1 Gm/67ml  Susp (Sucralfate) .... Three Times A Day 6)  Xyzal 5 Mg  Tabs (Levocetirizine Dihydrochloride) .... Once Daily 7)  Hemocyte 324 Mg  Tabs (Ferrous Fumarate) .... Two Times A Day 8)  Viactiv 500-100-40  Chew (Calcium-Vitamin D-Vitamin K) .... 3 Per Day 9)  Vitamin D 44010 Unit  Caps (Ergocalciferol) .... One By Mouth Weekly 10)  Prevacid Solutab 30 Mg Tbdp (Lansoprazole) .... Dissolve 2  Tablets Under Tongue  Two Times A Day  Allergies (verified): 1)  Erythromycin Ethylsuccinate 2)  Prednisone (Prednisone)  Past History:  Family History: Last updated: 09-04-08 mother died from cancer of cervix father had MI in 53 Family History of CAD Female 1st degree relative <50 No FH of Colon Cancer:  Social History: Last updated: 09-04-2008 Retired  part-time works 1  day a week [employer] Single Never Smoked Drug use-no Alcohol Use - no Daily Caffeine Use  Risk Factors: Caffeine Use: 1 (04-Sep-2008)  Risk Factors: Smoking Status: never (08/03/2009) Passive Smoke Exposure: yes (08/03/2009)  Past medical, surgical, family and social histories (including risk factors) reviewed, and no changes noted (except as noted below).  Past Medical History: Reviewed history from 06/14/2007 and no changes required. GERD Osteoporosis HRT Esophageal CA Anemia-iron deficiency Hypertension Allergic rhinitis  Past Surgical History: Reviewed history from 09-04-2008 and no changes required. TAH + Left O Esphageal removal  for cancer,GASTRIC PULL- THRU 1993 Cholecystectomy Right arm fx Oophorectomy  Family History: Reviewed history from 04-Sep-2008 and no changes required. mother died from cancer of cervix father had MI in 49 Family History of CAD Female 1st degree relative <50 No FH of Colon Cancer:  Social History: Reviewed history from 09-04-08 and no changes required. Retired  part-time works 1 day a week [employer] Single Never Smoked Drug use-no Alcohol Use - no Daily Caffeine Use  Review of Systems       The patient complains of prolonged cough.  The patient denies anorexia, fever, weight loss, weight gain, vision loss, decreased hearing, hoarseness, chest pain, syncope, dyspnea on exertion, peripheral edema, headaches, hemoptysis, abdominal pain, melena, hematochezia, severe indigestion/heartburn, hematuria, incontinence, genital sores, muscle weakness, suspicious skin lesions, transient blindness, difficulty walking, depression, unusual weight change, abnormal bleeding, enlarged lymph nodes, angioedema, and breast masses.    Physical Exam  General:  Well developed, well nourished, no acute distress. Head:  Normocephalic and atraumatic. Nose:  External nasal examination shows no deformity or  inflammation. Nasal mucosa are pink and moist without lesions or exudates. Mouth:  pharynx pink and moist.   Lungs:  R decreased breath sounds, R base crackles, R wheezes, and R egophony.   Heart:  normal rate and regular rhythm.   Abdomen:  soft, non-tender, and normal bowel sounds.   Msk:  no joint tenderness and no joint swelling.   Extremities:  trace left pedal edema and trace right pedal edema.   Neurologic:  alert & oriented X3 and finger-to-nose normal.     Impression & Recommendations:  Problem # 1:  ASPIRATION PNEUMONIA, RIGHT LOWER LOBE (ICD-507.0) amoxil 875 two times a day for 10 days for earlth aspiration coverage consider added medications coverage if cough and sputum progresses but now she has early "chemical" pneumonitis only  Problem # 2:  GERD (ICD-530.81)  Her updated medication list for this problem includes:    Carafate 1 Gm/107ml Susp (Sucralfate) .Marland Kitchen... Three times a day    Prevacid Solutab 30 Mg Tbdp (Lansoprazole) .Marland Kitchen... Dissolve 2  tablets under tongue  two times a day  Labs Reviewed: Hgb: 13.7 (01/23/2009)   Hct: 41.3 (01/23/2009)  Problem # 3:  HYPERTENSION (ICD-401.9)  Her updated medication  list for this problem includes:    Ziac 2.5-6.25 Mg Tabs (Bisoprolol-hydrochlorothiazide) ..... Once daily  Complete Medication List: 1)  Promethegan 25 Mg Supp (Promethazine hcl) .... As needed 2)  Ambien 5 Mg Tabs (Zolpidem tartrate) .... As needed 3)  Ziac 2.5-6.25 Mg Tabs (Bisoprolol-hydrochlorothiazide) .... Once daily 4)  Reglan 5 Mg Tabs (Metoclopramide hcl) .... Take 1 tablet by mouth three times a day 5)  Carafate 1 Gm/51ml Susp (Sucralfate) .... Three times a day 6)  Xyzal 5 Mg Tabs (Levocetirizine dihydrochloride) .... Once daily 7)  Hemocyte 324 Mg Tabs (Ferrous fumarate) .... Two times a day 8)  Viactiv 500-100-40 Chew (Calcium-vitamin d-vitamin k) .... 3 per day 9)  Vitamin D 29562 Unit Caps (Ergocalciferol) .... One by mouth weekly 10)  Prevacid  Solutab 30 Mg Tbdp (Lansoprazole) .... Dissolve 2  tablets under tongue  two times a day 11)  Amoxicillin 875 Mg Tabs (Amoxicillin) .... One by mouth bid  Hypertension Assessment/Plan:      The patient's hypertensive risk group is category B: At least one risk factor (excluding diabetes) with no target organ damage.  Her calculated 10 year risk of coronary heart disease is 6 %.  Today's blood pressure is 106/70.  Her blood pressure goal is < 140/90.  Patient Instructions: 1)  Please schedule a follow-up appointment in 3 months. Prescriptions: AMOXICILLIN 875 MG TABS (AMOXICILLIN) one by mouth BID  #20 x 0   Entered and Authorized by:   Stacie Glaze MD   Signed by:   Stacie Glaze MD on 08/03/2009   Method used:   Electronically to        [facility] [id number] (retail)       [address] [place], [place]  [address]       Ph: [phone] or [phone]       Fax: [phone]   RxID:   [id number]

## 2010-08-31 NOTE — Progress Notes (Signed)
Summary: Pt told by [facility], to stop taking Reglan. Risk for Parkinsons  Phone Note Call from Patient Call back at Home Phone [phone]   Caller: Patient Summary of Call: Pt called and said that she rcvd notice from [facility] that Reglan, can not be filled due to risk for Parkinsons disease and told the pt to stop taking it, and have pcp prescribe alternative if possible.   Initial call taken by: Lucy Antigua,  February 18, 2010 8:03 AM  Follow-up for Phone Call        [facility] is practicing medicine with out a liscense and giving medicsal advice is disreguard to [place]  this pt is a special case s/p esophageal cancer with an esophagectomy with small bowel pull up frequent  aspiration pnemonias The gastroentreologist and I feel that this is an important drug and that she has tollerated it since 1993 please the medical director of [facility] on phone for me  Follow-up by: Stacie Glaze MD,  February 18, 2010 8:18 AM  Additional Follow-up for Phone Call Additional follow up Details #1::        talked with pt and she states that [facility] called her stating that someone from  had signed a form stating it was ok to stop reglan  -named "beth"  no one here is named beth,  I talked with [facility] and they stated we just needed to refill medication which I did Additional Follow-up by: Willy Eddy, LPN,  February 18, 2010 9:03 AM    Prescriptions: REGLAN 5 MG  TABS (METOCLOPRAMIDE HCL) Take 1 tablet by mouth three times a day  #270 x 3   Entered by:   Willy Eddy, LPN   Authorized by:   Stacie Glaze MD   Signed by:   Willy Eddy, LPN on 09/81/1914   Method used:   Printed then faxed to ...       [facility] [id number] (retail)       [address]       [place], [place]  [address]       Ph: [phone] or [phone]       Fax: [phone]   RxID:   [id number]

## 2010-08-31 NOTE — Assessment & Plan Note (Signed)
Summary: 3 month follow up/cjr   Vital Signs:  Patient profile:   77 year old female Height:      63 inches Weight:      124 pounds BMI:     22.05 Temp:     98.2 degrees F oral Pulse rate:   72 / minute Resp:     14 per minute BP sitting:   110 / 70  (left arm)  Vitals Entered By: Willy Eddy, LPN (May 11, 2010 9:28 AM) CC: roa, Hypertension Management Is Patient Diabetic? No   Primary Care Provider:  Peri Jefferson  CC:  roa and Hypertension Management.  History of Present Illness: The pt had concerns about stopping the reglan by GI after all these years and felt like the option presented was a feeding tube. She understands the risk of reglan but it affords her the freedom she desires andhas noted symptomatic improvement in her tremmors with reduced reglan.  Had a fall with bruise to face, no LOC reported she is 3 dasy out without nausea  Hypertension History:      She denies headache, chest pain, palpitations, dyspnea with exertion, orthopnea, PND, peripheral edema, visual symptoms, neurologic problems, syncope, and side effects from treatment.        Positive major cardiovascular risk factors include female age 59 years old or older, hypertension, and family history for ischemic heart disease (males less than 20 years old).  Negative major cardiovascular risk factors include non-tobacco-user status.     Preventive Screening-Counseling & Management  Alcohol-Tobacco     Smoking Status: never     Passive Smoke Exposure: yes     Tobacco Counseling: not indicated; no tobacco use     Passive Smoke Counseling: to avoid passive smoke exposure  Problems Prior to Update: 1)  Cough  (ICD-786.2) 2)  Personal History of Colonic Polyps  (ICD-V12.72) 3)  Hx of Esophageal Cancer  (ICD-V10.03) 4)  Gerd  (ICD-530.81) 5)  Aspiration Pneumonia, Right Lower Lobe  (ICD-507.0) 6)  Acute Bronchitis  (ICD-466.0) 7)  Family History of Cad Female 1st Degree Relative <50   (ICD-V17.3) 8)  Allergic Rhinitis  (ICD-477.9) 9)  Hx of Neoplasm, Malignant, Esophagus  (ICD-150.9) 10)  Hypertension  (ICD-401.9) 11)  Anemia-iron Deficiency  (ICD-280.9) 12)  B12 Deficiency  (ICD-266.2) 13)  Osteoporosis  (ICD-733.00) 14)  Gerd  (ICD-530.81)  Current Problems (verified): 1)  Cough  (ICD-786.2) 2)  Personal History of Colonic Polyps  (ICD-V12.72) 3)  Hx of Esophageal Cancer  (ICD-V10.03) 4)  Gerd  (ICD-530.81) 5)  Aspiration Pneumonia, Right Lower Lobe  (ICD-507.0) 6)  Acute Bronchitis  (ICD-466.0) 7)  Family History of Cad Female 1st Degree Relative <50  (ICD-V17.3) 8)  Allergic Rhinitis  (ICD-477.9) 9)  Hx of Neoplasm, Malignant, Esophagus  (ICD-150.9) 10)  Hypertension  (ICD-401.9) 11)  Anemia-iron Deficiency  (ICD-280.9) 12)  B12 Deficiency  (ICD-266.2) 13)  Osteoporosis  (ICD-733.00) 14)  Gerd  (ICD-530.81)  Medications Prior to Update: 1)  Promethegan 25 Mg  Supp (Promethazine Hcl) .... As Needed 2)  Ambien 5 Mg  Tabs (Zolpidem Tartrate) .... As Needed 3)  Ziac 2.5-6.25 Mg  Tabs (Bisoprolol-Hydrochlorothiazide) .... Once Daily 4)  Reglan 5 Mg  Tabs (Metoclopramide Hcl) .... Take 1 Tablet By Mouth Three Times A Day 5)  Carafate 1 Gm/85ml  Susp (Sucralfate) .... Three Times A Day 6)  Xyzal 5 Mg  Tabs (Levocetirizine Dihydrochloride) .... Once Daily 7)  Hemocyte Plus 106-1 Mg Caps (B Complex-C-Min-Fe-Fa) .Marland KitchenMarland KitchenMarland Kitchen  1 Two Times A Day 8)  Viactiv 500-100-40  Chew (Calcium-Vitamin D-Vitamin K) .... 3 Per Day 9)  Vitamin D 16109 Unit  Caps (Ergocalciferol) .... One By Mouth Weekly 10)  Prevacid Solutab 30 Mg Tbdp (Lansoprazole) .... Dissolve 2  Tablets Under Tongue  Two Times A Day 11)  Ventolin Hfa 108 (90 Base) Mcg/act Aers (Albuterol Sulfate) .... 2 Puffs Q 4 Hours As Needed Cough and Wheeze  Current Medications (verified): 1)  Promethegan 25 Mg  Supp (Promethazine Hcl) .... As Needed 2)  Ambien 5 Mg  Tabs (Zolpidem Tartrate) .... As Needed 3)  Ziac 2.5-6.25 Mg   Tabs (Bisoprolol-Hydrochlorothiazide) .... Once Daily 4)  Reglan 5 Mg  Tabs (Metoclopramide Hcl) .... Take 1 Tablet By Mouth Three Times A Day 5)  Carafate 1 Gm/12ml  Susp (Sucralfate) .... Three Times A Day 6)  Xyzal 5 Mg  Tabs (Levocetirizine Dihydrochloride) .... Once Daily 7)  Hemocyte Plus 106-1 Mg Caps (B Complex-C-Min-Fe-Fa) .Marland Kitchen.. 1 Two Times A Day 8)  Viactiv 500-100-40  Chew (Calcium-Vitamin D-Vitamin K) .... 3 Per Day 9)  Vitamin D 60454 Unit  Caps (Ergocalciferol) .... One By Mouth Weekly 10)  Prevacid Solutab 30 Mg Tbdp (Lansoprazole) .... Dissolve 2  Tablets Under Tongue  Two Times A Day 11)  Ventolin Hfa 108 (90 Base) Mcg/act Aers (Albuterol Sulfate) .... 2 Puffs Q 4 Hours As Needed Cough and Wheeze  Allergies (verified): 1)  Erythromycin Ethylsuccinate 2)  Prednisone (Prednisone)  Past History:  Family History: Last updated: 09-17-2008 mother died from cancer of cervix father had MI in 73 Family History of CAD Female 1st degree relative <50 No FH of Colon Cancer:  Social History: Last updated: 17-Sep-2008 Retired  part-time works 1 day a week [employer] Single Never Smoked Drug use-no Alcohol Use - no Daily Caffeine Use  Risk Factors: Caffeine Use: 1 (09/17/2008)  Risk Factors: Smoking Status: never (05/11/2010) Passive Smoke Exposure: yes (05/11/2010)  Past medical, surgical, family and social histories (including risk factors) reviewed, and no changes noted (except as noted below).  Past Medical History: Reviewed history from 06/14/2007 and no changes required. GERD Osteoporosis HRT Esophageal CA Anemia-iron deficiency Hypertension Allergic rhinitis  Past Surgical History: Reviewed history from 09-17-08 and no changes required. TAH + Left O Esphageal removal  for cancer,GASTRIC PULL- THRU 1993 Cholecystectomy Right arm fx Oophorectomy  Family History: Reviewed history from 09/17/2008 and no changes required. mother died  from cancer of cervix father had MI in 39 Family History of CAD Female 1st degree relative <50 No FH of Colon Cancer:  Social History: Reviewed history from 09-17-08 and no changes required. Retired  part-time works 1 day a week [employer] Single Never Smoked Drug use-no Alcohol Use - no Daily Caffeine Use  Review of Systems       The patient complains of weight loss and decreased hearing.  The patient denies anorexia, fever, weight gain, vision loss, hoarseness, chest pain, syncope, dyspnea on exertion, peripheral edema, prolonged cough, headaches, hemoptysis, abdominal pain, melena, hematochezia, severe indigestion/heartburn, hematuria, incontinence, genital sores, muscle weakness, suspicious skin lesions, transient blindness, difficulty walking, depression, unusual weight change, abnormal bleeding, enlarged lymph nodes, angioedema, and breast masses.         Flu Vaccine Consent Questions     Do you have a history of severe allergic reactions to this vaccine? no    Any prior history of allergic reactions to egg and/or gelatin? no    Do you have  a sensitivity to the preservative Thimersol? no    Do you have a past history of Guillan-Barre Syndrome? no    Do you currently have an acute febrile illness? no    Have you ever had a severe reaction to latex? no    Vaccine information given and explained to patient? yes    Are you currently pregnant? no    Lot Number:[id number]   Exp Date:01/29/2011   Site Given  Left Deltoid IM   Physical Exam  General:  Well-developed,well-nourished,in no acute distress; alert,appropriate and cooperative throughout examination  stable weight Head:  Normocephalic and  bruise with small laceration over the right eye with moderate bruising Eyes:  pupils equal and pupils round.   Ears:  R ear normal and L ear normal.   Nose:  no external deformity and no nasal discharge.   Neck:  No deformities, masses, or tenderness noted. Lungs:   normal respiratory effort, R base crackles, and L base crackles.   Heart:  normal rate and regular rhythm.     Impression & Recommendations:  Problem # 1:  ANEMIA-IRON DEFICIENCY (ICD-280.9) moniter today Hgb: 14.4 (02/09/2010)   Hct: 43.4 (02/09/2010)   Platelets: 247.0 (02/09/2010) RBC: 4.81 (02/09/2010)   RDW: 14.8 (02/09/2010)   WBC: 6.0 (02/09/2010) MCV: 90.3 (02/09/2010)   MCHC: 33.3 (02/09/2010) Iron: 59 (02/09/2010)   % Sat: 20.2 (02/09/2010) B12: 809 (02/09/2010)   Folate: >20.0 ng/mL (02/09/2010)   TSH: 1.12 (09/07/2006) iron levels stable, B12 at 809 continue shots every 3 months  Problem # 2:  COUGH (ICD-786.2) due to reflux, no recent aspirations!  Problem # 3:  HX OF ESOPHAGEAL CANCER (ICD-V10.03) with rebuilt esphagus ( small bowel pull)  Problem # 4:  HYPERTENSION (ICD-401.9)  Her updated medication list for this problem includes:    Ziac 2.5-6.25 Mg Tabs (Bisoprolol-hydrochlorothiazide) ..... Once daily  BP today: 110/70 Prior BP: 114/72 (02/09/2010)  Prior 10 Yr Risk Heart Disease: 6 % (02/09/2010)  Labs Reviewed: K+: 3.9 (11/02/2009) Creat: : 0.7 (11/02/2009)   Chol: 193 (09/07/2006)   HDL: 59.4 (09/07/2006)   LDL: 116 (09/07/2006)   TG: 90 (09/07/2006)  Complete Medication List: 1)  Promethegan 25 Mg Supp (Promethazine hcl) .... As needed 2)  Ambien 5 Mg Tabs (Zolpidem tartrate) .... As needed 3)  Ziac 2.5-6.25 Mg Tabs (Bisoprolol-hydrochlorothiazide) .... Once daily 4)  Reglan 5 Mg Tabs (Metoclopramide hcl) .... Take 1 tablet by mouth three times a day 5)  Carafate 1 Gm/59ml Susp (Sucralfate) .... Three times a day 6)  Xyzal 5 Mg Tabs (Levocetirizine dihydrochloride) .... Once daily 7)  Hemocyte Plus 106-1 Mg Caps (B complex-c-min-fe-fa) .Marland Kitchen.. 1 two times a day 8)  Viactiv 500-100-40 Chew (Calcium-vitamin d-vitamin k) .... 3 per day 9)  Vitamin D 16109 Unit Caps (Ergocalciferol) .... One by mouth weekly 10)  Prevacid Solutab 30 Mg Tbdp (Lansoprazole)  .... Dissolve 2  tablets under tongue  two times a day 11)  Ventolin Hfa 108 (90 Base) Mcg/act Aers (Albuterol sulfate) .... 2 puffs q 4 hours as needed cough and wheeze  Other Orders: Flu Vaccine 66yrs + MEDICARE PATIENTS (U0454) Administration Flu vaccine - MCR (U9811)  Hypertension Assessment/Plan:      The patient's hypertensive risk group is category B: At least one risk factor (excluding diabetes) with no target organ damage.  Her calculated 10 year risk of coronary heart disease is 6 %.  Today's blood pressure is 110/70.  Her blood pressure goal is < 140/90.  Patient  Instructions: 1)  Please schedule a follow-up appointment in 3 months. Prescriptions: XYZAL 5 MG  TABS (LEVOCETIRIZINE DIHYDROCHLORIDE) once daily  #90 x 3   Entered by:   Willy Eddy, LPN   Authorized by:   Stacie Glaze MD   Signed by:   Willy Eddy, LPN on 56/38/7564   Method used:   Print then Give to Patient   RxID:   [id number] AMBIEN 5 MG  TABS (ZOLPIDEM TARTRATE) as needed  #90 x 3   Entered by:   Willy Eddy, LPN   Authorized by:   Stacie Glaze MD   Signed by:   Willy Eddy, LPN on 16/08/930   Method used:   Print then Give to Patient   RxID:   [id number] PROMETHEGAN 25 MG  SUPP (PROMETHAZINE HCL) as needed  #90 x 3   Entered by:   Willy Eddy, LPN   Authorized by:   Stacie Glaze MD   Signed by:   Willy Eddy, LPN on 37/62/8315   Method used:   Print then Give to Patient   RxID:   [id number]   Appended Document: Orders Update     Clinical Lists Changes  Orders: Added new Service order of Vit B12 1000 mcg (W5462) - Signed Added new Service order of Admin of Therapeutic Inj  intramuscular or subcutaneous (70350) - Signed       Medication Administration  Injection # 1:    Medication: Vit B12 1000 mcg    Diagnosis: B12 DEFICIENCY (ICD-266.2)    Route: IM    Site: R deltoid    Exp Date: 02/29/2012    Lot #: [id number]    Mfr:  American Regent    Patient tolerated injection without complications    Given by: Willy Eddy, LPN (May 11, 2010 10:11 AM)  Orders Added: 1)  Vit B12 1000 mcg [J3420] 2)  Admin of Therapeutic Inj  intramuscular or subcutaneous [09381]

## 2010-09-01 DIAGNOSIS — G4700 Insomnia, unspecified: Secondary | ICD-10-CM | POA: Insufficient documentation

## 2010-09-02 NOTE — Progress Notes (Signed)
Summary: Medication   Phone Note Call from Patient Call back at Home Phone [phone]   Caller: Patient Call For: Dr. Christella Hartigan Summary of Call: Needs a refill on her Prevacid [facility] [phone]...has not received her mail order yet Initial call taken by: Karna Christmas,  July 19, 2010 1:54 PM    Prescriptions: PREVACID SOLUTAB 30 MG TBDP (LANSOPRAZOLE) dissolve 2  tablets under tongue  two times a day  #120 x 0   Entered by:   Chales Abrahams CMA (AAMA)   Authorized by:   Rachael Fee MD   Signed by:   Chales Abrahams CMA (AAMA) on 07/20/2010   Method used:   Faxed to ...       [facility] [id number] (retail)       [address]       [place], [place]  [address]       Ph: [phone] or [phone]       Fax: [phone]   RxID:   [id number]   Appended Document: Medication called [facility] to cx rx should be sent to [facility]

## 2010-09-02 NOTE — Progress Notes (Signed)
  Phone Note Call from Patient Call back at Home Phone [phone]   Caller: Patient Call For: Erica Glaze MD Summary of Call: [facility] Ceftin has made her sick all week ..........nauseated with the Ceftin and still having productive green sputum.   Initial call taken by: [facility] CMA AAMA,  August 17, 2010 8:20 AM  Follow-up for Phone Call        per dr Lovell Sheehan- may have doxycycline 100 two times a day for 10 days Follow-up by: Willy Eddy, LPN,  August 17, 2010 10:57 AM  Additional Follow-up for Phone Call Additional follow up Details #1::        Neice calls stating pt is vomiting x 2 days and needs to see a Dr.   Shirline Frees with Dr. Rodena Medin. Additional Follow-up by: Lynann Beaver CMA AAMA,  August 17, 2010 11:27 AM

## 2010-09-02 NOTE — Medication Information (Signed)
Summary: Approved/med[employer]  Approved/med[employer]   Imported By: Lester [place] 07/27/2010 11:03:40  _____________________________________________________________________  External Attachment:    Type:   Image     Comment:   External Document

## 2010-09-02 NOTE — Progress Notes (Signed)
Summary: Prior auth still not approved   Phone Note Call from Patient Call back at [facility] Phone [phone]   Call For: Dr Christella Hartigan Reason for Call: Talk to Nurse Summary of Call: Her prior Berkley Harvey is still not done - received a letter. Would like to discuss with nurse. Initial call taken by: Leanor Kail [facility],  July 12, 2010 1:56 PM  Follow-up for Phone Call        I spoke with the pt and advised her I did send the appeal and I would call and check on the progress of that.  When I called [employer] they gave me another number to call [facility] [phone].  Called that number and was placed on hold for 15 minutes.  I will try again at a less busy time. Follow-up by: Chales Abrahams CMA Duncan Dull),  July 12, 2010 3:04 PM  Additional Follow-up for Phone Call Additional follow up Details #1::        Insurance company was contacted and the appeal will be expedited and should take no more than 24 hours to complete.  Pt aware Additional Follow-up by: Chales Abrahams CMA (AAMA),  July 13, 2010 1:32 PM

## 2010-09-02 NOTE — Assessment & Plan Note (Signed)
Summary: dehydrated?/dm   Vital Signs:  Patient profile:   77 year old female Weight:      124 pounds Pulse rate:   102 / minute BP sitting:   126 / 80  (left arm)  Vitals Entered By: Kyung Rudd, CMA (August 17, 2010 2:06 PM) CC: pt c/o dehydration...pt c/o cefuroxime is causing N/V   Primary Care Provider:  Stacie Glaze, MD   CC:  pt c/o dehydration...pt c/o cefuroxime is causing N/V.  History of Present Illness: Patient presents to clinic as a workin for evaluation of cough and N/V.  PMH and last office visit reviewed. H/o esophageal ca s/p surgery with repeated episodes of aspiration pneumonia. Recently seen by pmd with concern over cough. Placed on abx ceftin and developed subsequent n/v daily since beginning. Last dose of ceftin last night. Notes continued cough productive for yellow sputum. Denies f/c, hemoptysis or dyspnea. Wt reviewed stable from last PMD visit.  Allergies: 1)  Erythromycin Ethylsuccinate 2)  Prednisone (Prednisone)  Past History:  Past medical, surgical, family and social histories (including risk factors) reviewed, and no changes noted (except as noted below).  Past Medical History: Reviewed history from 06/14/2007 and no changes required. GERD Osteoporosis HRT Esophageal CA Anemia-iron deficiency Hypertension Allergic rhinitis  Past Surgical History: Reviewed history from 08/29/2008 and no changes required. TAH + Left O Esphageal removal  for cancer,GASTRIC PULL- THRU 1993 Cholecystectomy Right arm fx Oophorectomy  Family History: Reviewed history from 08/29/2008 and no changes required. mother died from cancer of cervix father had MI in 80 Family History of CAD Female 1st degree relative <50 No FH of Colon Cancer:  Social History: Reviewed history from 08/29/2008 and no changes required. Retired  part-time works 1 day a week [employer] Single Never Smoked Drug use-no Alcohol Use - no Daily Caffeine  Use  Review of Systems      See HPI  Physical Exam  General:  Well-developed,well-nourished,in no acute distress; alert,appropriate and cooperative throughout examination Head:  Normocephalic and atraumatic without obvious abnormalities. No apparent alopecia or balding. Eyes:  pupils equal, pupils round, and corneas and lenses clear.   Ears:  no external deformities.   Nose:  no external deformity.   Mouth:  Oral mucosa and oropharynx without lesions or exudates.  Teeth in good repair. Lungs:  normal respiratory effort, no intercostal retractions, no accessory muscle use, no crackles, and no wheezes.  Intermittent bilateral ?rales that clear with cough. Heart:  Normal rate and regular rhythm. S1 and S2 normal without gallop, murmur, click, rub or other extra sounds. Abdomen:  Bowel sounds positive,abdomen soft and non-tender without masses, organomegaly or hernias noted.   Impression & Recommendations:  Problem # 1:  COUGH (ICD-786.2) Assessment Unchanged Given propensity for aspiration and recent cough recommend further abx tx. D/c ceftin due to intolerance. Begin doxycycline x10d. Followup if no improvement or worsening.  Problem # 2:  NAUSEA WITH VOMITING (ICD-787.01) Assessment: New Pt attributes to recent ceftin use. D/c ceftin. Monitor for cessation of symptoms. Does not appear clinically volume deplete.  Complete Medication List: 1)  Promethegan 25 Mg Supp (Promethazine hcl) .... As needed 2)  Ambien 5 Mg Tabs (Zolpidem tartrate) .... As needed 3)  Ziac 2.5-6.25 Mg Tabs (Bisoprolol-hydrochlorothiazide) .... Once daily 4)  Reglan 5 Mg Tabs (Metoclopramide hcl) .... Take 1 tablet by mouth twice a day 5)  Carafate 1 Gm/58ml Susp (Sucralfate) .... Three times a day 6)  Xyzal 5 Mg Tabs (Levocetirizine dihydrochloride) .Marland KitchenMarland KitchenMarland Kitchen  Once daily 7)  Hemocyte Plus 106-1 Mg Caps (B complex-c-min-fe-fa) .Marland Kitchen.. 1 two times a day 8)  Viactiv 500-100-40 Chew (Calcium-vitamin d-vitamin k) .... 3 per  day 9)  Vitamin D 19147 Unit Caps (Ergocalciferol) .... One by mouth weekly 10)  Prevacid Solutab 30 Mg Tbdp (Lansoprazole) .... Dissolve 2  tablets under tongue  two times a day 11)  Tussionex Pennkinetic Er 10-8 Mg/65ml Lqcr (Hydrocod polst-chlorphen polst) .... One tsp by mouth two times a day 12)  Mucinex Fast-max Dm Max 20-400 Mg/36ml Liqd (Dextromethorphan-guaifenesin) .... One cap qid 13)  Doxycycline Hyclate 100 Mg Tabs (Doxycycline hyclate) .... One by mouth bid Prescriptions: DOXYCYCLINE HYCLATE 100 MG TABS (DOXYCYCLINE HYCLATE) one by mouth bid  #20 x 0   Entered and Authorized by:   Edwyna Perfect MD   Signed by:   Edwyna Perfect MD on 08/17/2010   Method used:   Faxed to ...       [id number] (retail)       [address]       [place], [place]  [address]       Ph: [phone] or [phone]       Fax: [phone]   RxID:   [id number]    Orders Added: 1)  Est. Patient Level IV [38756]

## 2010-09-02 NOTE — Assessment & Plan Note (Signed)
Summary: 3 month roa//lch   Vital Signs:  Patient profile:   77 year old female Weight:      125 pounds Temp:     98.5 degrees F oral Pulse rate:   80 / minute Pulse rhythm:   regular Resp:     16 per minute BP sitting:   120 / 78  Vitals Entered By: Lynann Beaver CMA AAMA (August 12, 2010 8:51 AM) CC: rov Is Patient Diabetic? No Pain Assessment Patient in pain? no        Primary Care Provider:  Stacie Glaze, MD   CC:  rov.  History of Present Illness: Has had mild conjestion after having reflux with vomiting The pt has been of reglan due to this... she has multiple bout of aspiration pnemonia in the past The cough began after the reflux. She is afrbril The prevacid two times a day was approved she has a hx of esphagial cnacer with resection and a small bowel pull through.   Preventive Screening-Counseling & Management  Alcohol-Tobacco     Smoking Status: never     Tobacco Counseling: not indicated; no tobacco use  Problems Prior to Update: 1)  Cough  (ICD-786.2) 2)  Personal History of Colonic Polyps  (ICD-V12.72) 3)  Hx of Esophageal Cancer  (ICD-V10.03) 4)  Gerd  (ICD-530.81) 5)  Aspiration Pneumonia, Right Lower Lobe  (ICD-507.0) 6)  Acute Bronchitis  (ICD-466.0) 7)  Family History of Cad Female 1st Degree Relative <50  (ICD-V17.3) 8)  Allergic Rhinitis  (ICD-477.9) 9)  Hx of Neoplasm, Malignant, Esophagus  (ICD-150.9) 10)  Hypertension  (ICD-401.9) 11)  Anemia-iron Deficiency  (ICD-280.9) 12)  B12 Deficiency  (ICD-266.2) 13)  Osteoporosis  (ICD-733.00) 14)  Gerd  (ICD-530.81)  Medications Prior to Update: 1)  Promethegan 25 Mg  Supp (Promethazine Hcl) .... As Needed 2)  Ambien 5 Mg  Tabs (Zolpidem Tartrate) .... As Needed 3)  Ziac 2.5-6.25 Mg  Tabs (Bisoprolol-Hydrochlorothiazide) .... Once Daily 4)  Reglan 5 Mg  Tabs (Metoclopramide Hcl) .... Take 1 Tablet By Mouth Twice A Day 5)  Carafate 1 Gm/79ml  Susp (Sucralfate) .... Three Times A Day 6)   Xyzal 5 Mg  Tabs (Levocetirizine Dihydrochloride) .... Once Daily 7)  Hemocyte Plus 106-1 Mg Caps (B Complex-C-Min-Fe-Fa) .Marland Kitchen.. 1 Two Times A Day 8)  Viactiv 500-100-40  Chew (Calcium-Vitamin D-Vitamin K) .... 3 Per Day 9)  Vitamin D 16109 Unit  Caps (Ergocalciferol) .... One By Mouth Weekly 10)  Prevacid Solutab 30 Mg Tbdp (Lansoprazole) .... Dissolve 2  Tablets Under Tongue  Two Times A Day  Current Medications (verified): 1)  Promethegan 25 Mg  Supp (Promethazine Hcl) .... As Needed 2)  Ambien 5 Mg  Tabs (Zolpidem Tartrate) .... As Needed 3)  Ziac 2.5-6.25 Mg  Tabs (Bisoprolol-Hydrochlorothiazide) .... Once Daily 4)  Reglan 5 Mg  Tabs (Metoclopramide Hcl) .... Take 1 Tablet By Mouth Twice A Day 5)  Carafate 1 Gm/64ml  Susp (Sucralfate) .... Three Times A Day 6)  Xyzal 5 Mg  Tabs (Levocetirizine Dihydrochloride) .... Once Daily 7)  Hemocyte Plus 106-1 Mg Caps (B Complex-C-Min-Fe-Fa) .Marland Kitchen.. 1 Two Times A Day 8)  Viactiv 500-100-40  Chew (Calcium-Vitamin D-Vitamin K) .... 3 Per Day 9)  Vitamin D 60454 Unit  Caps (Ergocalciferol) .... One By Mouth Weekly 10)  Prevacid Solutab 30 Mg Tbdp (Lansoprazole) .... Dissolve 2  Tablets Under Tongue  Two Times A Day  Allergies (verified): 1)  Erythromycin Ethylsuccinate 2)  Prednisone (Prednisone)  Past History:  Family History: Last updated: September 01, 2008 mother died from cancer of cervix father had MI in 19 Family History of CAD Female 1st degree relative <50 No FH of Colon Cancer:  Social History: Last updated: 09/01/08 Retired  part-time works 1 day a week [employer] Single Never Smoked Drug use-no Alcohol Use - no Daily Caffeine Use  Risk Factors: Caffeine Use: 1 (09-01-08)  Risk Factors: Smoking Status: never (08/12/2010) Passive Smoke Exposure: yes (05/11/2010)  Past medical, surgical, family and social histories (including risk factors) reviewed, and no changes noted (except as noted below).  Past Medical  History: Reviewed history from 06/14/2007 and no changes required. GERD Osteoporosis HRT Esophageal CA Anemia-iron deficiency Hypertension Allergic rhinitis  Past Surgical History: Reviewed history from 01-Sep-2008 and no changes required. TAH + Left O Esphageal removal  for cancer,GASTRIC PULL- THRU 1993 Cholecystectomy Right arm fx Oophorectomy  Family History: Reviewed history from 09/01/08 and no changes required. mother died from cancer of cervix father had MI in 14 Family History of CAD Female 1st degree relative <50 No FH of Colon Cancer:  Social History: Reviewed history from 2008/09/01 and no changes required. Retired  part-time works 1 day a week [employer] Single Never Smoked Drug use-no Alcohol Use - no Daily Caffeine Use  Review of Systems  The patient denies anorexia, fever, weight loss, weight gain, vision loss, decreased hearing, hoarseness, chest pain, syncope, dyspnea on exertion, peripheral edema, prolonged cough, headaches, hemoptysis, abdominal pain, melena, hematochezia, severe indigestion/heartburn, hematuria, incontinence, genital sores, muscle weakness, suspicious skin lesions, transient blindness, difficulty walking, depression, unusual weight change, abnormal bleeding, enlarged lymph nodes, angioedema, breast masses, and testicular masses.    Physical Exam  General:  Well-developed,well-nourished,in no acute distress; alert,appropriate and cooperative throughout examination Head:  Normocephalic and atraumatic without obvious abnormalities. No apparent alopecia or balding. Eyes:  pupils equal and pupils round.   Ears:  R ear normal and L ear normal.   Nose:  no external deformity and no nasal discharge.   Neck:  No deformities, masses, or tenderness noted. Lungs:  rales at base of the right lung Heart:  normal rate and regular rhythm.     Impression & Recommendations:  Problem # 1:  ASPIRATION PNEUMONIA, RIGHT LOWER LOBE  (ICD-507.0) Assessment Deteriorated the pt has recurrent symptoms and signs on PE begin ceftin 250 two times a day and cough meds to include liguid mucinex  Problem # 2:  COUGH (ICD-786.2) Assessment: Deteriorated due to the vomiting and the severe reflux from her surgery  Problem # 3:  GERD (ICD-530.81)  Her updated medication list for this problem includes:    Carafate 1 Gm/80ml Susp (Sucralfate) .Marland Kitchen... Three times a day    Prevacid Solutab 30 Mg Tbdp (Lansoprazole) .Marland Kitchen... Dissolve 2  tablets under tongue  two times a day  Labs Reviewed: Hgb: 14.4 (02/09/2010)   Hct: 43.4 (02/09/2010)  Problem # 4:  HYPERTENSION (ICD-401.9)  Her updated medication list for this problem includes:    Ziac 2.5-6.25 Mg Tabs (Bisoprolol-hydrochlorothiazide) ..... Once daily  BP today: 120/78 Prior BP: 110/64 (06/18/2010)  Prior 10 Yr Risk Heart Disease: 6 % (02/09/2010)  Labs Reviewed: K+: 3.9 (11/02/2009) Creat: : 0.7 (11/02/2009)   Chol: 193 (09/07/2006)   HDL: 59.4 (09/07/2006)   LDL: 116 (09/07/2006)   TG: 90 (09/07/2006)  Orders: TLB-BMP (Basic Metabolic Panel-BMET) (80048-METABOL) Venipuncture (94854)  Problem # 5:  ANEMIA-IRON DEFICIENCY (ICD-280.9) Assessment: Unchanged  Hgb: 14.4 (02/09/2010)   Hct: 43.4 (  02/09/2010)   Platelets: 247.0 (02/09/2010) RBC: 4.81 (02/09/2010)   RDW: 14.8 (02/09/2010)   WBC: 6.0 (02/09/2010) MCV: 90.3 (02/09/2010)   MCHC: 33.3 (02/09/2010) Iron: 59 (02/09/2010)   % Sat: 20.2 (02/09/2010) B12: 809 (02/09/2010)   Folate: >20.0 ng/mL (02/09/2010)   TSH: 1.12 (09/07/2006)  Orders: TLB-CBC Platelet - w/Differential (85025-CBCD) TLB-IBC Pnl (Iron/FE;Transferrin) (83550-IBC)  Problem # 6:  B12 DEFICIENCY (ICD-266.2) Assessment: Unchanged moniter and treat Orders: TLB-B12 + Folate Pnl (16109_60454-U98/JXB)  Complete Medication List: 1)  Promethegan 25 Mg Supp (Promethazine hcl) .... As needed 2)  Ambien 5 Mg Tabs (Zolpidem tartrate) .... As needed 3)   Ziac 2.5-6.25 Mg Tabs (Bisoprolol-hydrochlorothiazide) .... Once daily 4)  Reglan 5 Mg Tabs (Metoclopramide hcl) .... Take 1 tablet by mouth twice a day 5)  Carafate 1 Gm/76ml Susp (Sucralfate) .... Three times a day 6)  Xyzal 5 Mg Tabs (Levocetirizine dihydrochloride) .... Once daily 7)  Hemocyte Plus 106-1 Mg Caps (B complex-c-min-fe-fa) .Marland Kitchen.. 1 two times a day 8)  Viactiv 500-100-40 Chew (Calcium-vitamin d-vitamin k) .... 3 per day 9)  Vitamin D 14782 Unit Caps (Ergocalciferol) .... One by mouth weekly 10)  Prevacid Solutab 30 Mg Tbdp (Lansoprazole) .... Dissolve 2  tablets under tongue  two times a day 11)  Cefuroxime Axetil 500 Mg Tabs (Cefuroxime axetil) .... One by mouth two times a day for 10 days 12)  Tussionex Pennkinetic Er 10-8 Mg/16ml Lqcr (Hydrocod polst-chlorphen polst) .... One tsp by mouth two times a day 13)  Mucinex Fast-max Dm Max 20-400 Mg/59ml Liqd (Dextromethorphan-guaifenesin) .... One cap qid  Patient Instructions: 1)  there is now liguid mucinex 2)  cap full three to four times a day 3)  Please schedule a follow-up appointment in 3 months. Prescriptions: TUSSIONEX PENNKINETIC ER 10-8 MG/5ML LQCR (HYDROCOD POLST-CHLORPHEN POLST) one tsp by mouth two times a day  #4 oz x 2   Entered and Authorized by:   Stacie Glaze MD   Signed by:   Stacie Glaze MD on 08/12/2010   Method used:   Print then Give to Patient   RxID:   [id number] CEFUROXIME AXETIL 500 MG TABS (CEFUROXIME AXETIL) one by mouth two times a day for 10 days  #20 x 0   Entered and Authorized by:   Stacie Glaze MD   Signed by:   Stacie Glaze MD on 08/12/2010   Method used:   Print then Give to Patient   RxID:   [id number]    Orders Added: 1)  TLB-CBC Platelet - w/Differential [85025-CBCD] 2)  TLB-IBC Pnl (Iron/FE;Transferrin) [83550-IBC] 3)  TLB-B12 + Folate Pnl [82746_82607-B12/FOL] 4)  TLB-BMP (Basic Metabolic Panel-BMET) [80048-METABOL] 5)  Venipuncture [36415] 6)  Est. Patient  Level IV [25366]

## 2010-10-12 ENCOUNTER — Telehealth: Payer: Self-pay | Admitting: Internal Medicine

## 2010-10-12 LAB — URINE MICROSCOPIC-ADD ON

## 2010-10-12 LAB — DIFFERENTIAL
Basophils Relative: 0 % (ref 0–1)
Eosinophils Absolute: 0 10*3/uL (ref 0.0–0.7)
Monocytes Relative: 9 % (ref 3–12)
Neutrophils Relative %: 79 % — ABNORMAL HIGH (ref 43–77)

## 2010-10-12 LAB — URINALYSIS, ROUTINE W REFLEX MICROSCOPIC
Glucose, UA: NEGATIVE mg/dL
Nitrite: NEGATIVE
Specific Gravity, Urine: 1.023 (ref 1.005–1.030)
pH: 6 (ref 5.0–8.0)

## 2010-10-12 LAB — CBC
HCT: 42.2 % (ref 36.0–46.0)
Hemoglobin: 14.4 g/dL (ref 12.0–15.0)
MCH: 30.1 pg (ref 26.0–34.0)
MCV: 88.5 fL (ref 78.0–100.0)
RBC: 4.77 MIL/uL (ref 3.87–5.11)
WBC: 11.9 10*3/uL — ABNORMAL HIGH (ref 4.0–10.5)

## 2010-10-12 LAB — COMPREHENSIVE METABOLIC PANEL
ALT: 22 U/L (ref 0–35)
Alkaline Phosphatase: 60 U/L (ref 39–117)
BUN: 12 mg/dL (ref 6–23)
CO2: 25 mEq/L (ref 19–32)
Glucose, Bld: 150 mg/dL — ABNORMAL HIGH (ref 70–99)
Potassium: 5.7 mEq/L — ABNORMAL HIGH (ref 3.5–5.1)
Sodium: 140 mEq/L (ref 135–145)
Total Bilirubin: 1.2 mg/dL (ref 0.3–1.2)
Total Protein: 6.4 g/dL (ref 6.0–8.3)

## 2010-10-12 LAB — CULTURE, BLOOD (ROUTINE X 2)
Culture  Setup Time: 201111121816
Culture  Setup Time: 201111121816
Culture: NO GROWTH

## 2010-10-12 MED ORDER — ZOLPIDEM TARTRATE 5 MG PO TABS: 5.0000 mg | ORAL_TABLET | Freq: Every evening | ORAL | Status: DC | PRN

## 2010-10-12 MED ORDER — SUCRALFATE 1 GM/10ML PO SUSP: 1.0000 g | Freq: Three times a day (TID) | ORAL | Status: DC

## 2010-10-12 NOTE — Telephone Encounter (Signed)
done

## 2010-10-12 NOTE — Telephone Encounter (Signed)
Pt called and is needing to get a new written script for Ambien 5mg  and Carfate Suspension 10 ml tid. 90 day supply on both to [facility] order.

## 2010-10-19 ENCOUNTER — Telehealth: Payer: Self-pay | Admitting: Internal Medicine

## 2010-10-19 NOTE — Telephone Encounter (Signed)
Pt called and said that she rcvd shipment for Carafate Suspension, for only 14 fluid oz, which is about 2 wks. Pt needs 84 fluid oz, 6 bottles. Pt said that this was suppose to have been corrected. Pls correct and notify [facility] [phone]

## 2010-10-19 NOTE — Telephone Encounter (Signed)
Called into [facility] and ordered remaining amount-they will send and pt informed

## 2010-10-28 ENCOUNTER — Encounter: Payer: Self-pay | Admitting: Internal Medicine

## 2010-11-11 ENCOUNTER — Ambulatory Visit: Payer: Self-pay | Admitting: Internal Medicine

## 2010-11-16 ENCOUNTER — Encounter: Payer: Self-pay | Admitting: Internal Medicine

## 2010-11-22 ENCOUNTER — Other Ambulatory Visit: Payer: Self-pay | Admitting: *Deleted

## 2010-11-22 MED ORDER — BISOPROLOL-HYDROCHLOROTHIAZIDE 5-6.25 MG PO TABS: 1.0000 | ORAL_TABLET | Freq: Every day | ORAL | Status: DC

## 2010-11-24 ENCOUNTER — Ambulatory Visit (INDEPENDENT_AMBULATORY_CARE_PROVIDER_SITE_OTHER): Payer: PRIVATE HEALTH INSURANCE | Admitting: Internal Medicine

## 2010-11-24 ENCOUNTER — Encounter: Payer: Self-pay | Admitting: Internal Medicine

## 2010-11-24 VITALS — BP 130/80 | HR 72 | Temp 98.3°F | Resp 16 | Ht 63.0 in | Wt 122.0 lb

## 2010-11-24 DIAGNOSIS — D509 Iron deficiency anemia, unspecified: Secondary | ICD-10-CM

## 2010-11-24 DIAGNOSIS — I10 Essential (primary) hypertension: Secondary | ICD-10-CM

## 2010-11-24 DIAGNOSIS — J45909 Unspecified asthma, uncomplicated: Secondary | ICD-10-CM | POA: Insufficient documentation

## 2010-11-24 DIAGNOSIS — K219 Gastro-esophageal reflux disease without esophagitis: Secondary | ICD-10-CM

## 2010-11-24 LAB — CBC WITH DIFFERENTIAL/PLATELET
Basophils Absolute: 0 10*3/uL (ref 0.0–0.1)
Eosinophils Absolute: 0.1 10*3/uL (ref 0.0–0.7)
MCHC: 32.9 g/dL (ref 30.0–36.0)
MCV: 89 fl (ref 78.0–100.0)
Monocytes Absolute: 0.6 10*3/uL (ref 0.1–1.0)
Neutrophils Relative %: 61.3 % (ref 43.0–77.0)
Platelets: 247 10*3/uL (ref 150.0–400.0)
RDW: 16.4 % — ABNORMAL HIGH (ref 11.5–14.6)
WBC: 6.6 10*3/uL (ref 4.5–10.5)

## 2010-11-24 LAB — IRON: Iron: 59 ug/dL (ref 42–145)

## 2010-11-24 MED ORDER — BISOPROLOL-HYDROCHLOROTHIAZIDE 5-6.25 MG PO TABS: 1.0000 | ORAL_TABLET | Freq: Every day | ORAL | Status: DC

## 2010-11-24 NOTE — Assessment & Plan Note (Signed)
Blood pressure is stable today she continues bisoprolol with good control

## 2010-11-24 NOTE — Progress Notes (Signed)
  Subjective:    Patient ID: [name], female    DOB: Sep 06, 1928, 77 y.o.   MRN: [id number]  HPI The patient is a 77 year old female patient with a history of esophageal cancer status post small bowel pull-through with chronic reflux history of hypertension and history of iron deficiency anemia secondary to GI blood loss.  She presents today feeling well for chronic monitoring her blood pressures well controlled she denies any dark or tarry stools she has had no recent aspiration pneumonia although she's had a mild cough with a mild flare of her asthmatic bronchitis when the weather changed. She denies any fever chills any current episodes of nausea or vomiting she is currently using Reglan twice daily   Review of Systems     Objective:   Physical Exam  Constitutional: She is oriented to person, place, and time. No distress.       Frail appearing elderly white  HENT:  Head: Normocephalic and atraumatic.  Right Ear: External ear normal.  Left Ear: External ear normal.  Nose: Nose normal.  Mouth/Throat: Oropharynx is clear and moist.  Eyes: Conjunctivae and EOM are normal. Pupils are equal, round, and reactive to light.  Neck: Normal range of motion. Neck supple. No JVD present. No tracheal deviation present. No thyromegaly present.  Cardiovascular: Normal rate, regular rhythm, normal heart sounds and intact distal pulses.   No murmur heard. Pulmonary/Chest: Effort normal and breath sounds normal. She has no wheezes. She exhibits no tenderness.  Abdominal: Soft. Bowel sounds are normal.  Musculoskeletal: Normal range of motion. She exhibits no edema and no tenderness.  Lymphadenopathy:    She has no cervical adenopathy.  Neurological: She is alert and oriented to person, place, and time. She has normal reflexes. No cranial nerve deficit.  Skin: Skin is warm and dry. She is not diaphoretic.  Psychiatric: She has a normal mood and affect. Her behavior is normal.           Assessment & Plan:  Monitoring a CBC differential 9 level today examination did not reveal any evidence of aspiration pneumonia refill of her antihypertensive medication with stable blood pressure routine followup in 3 months

## 2010-11-24 NOTE — Assessment & Plan Note (Signed)
Color appears to be good she feels well as his chronic monitoring for iron deficiency anemia with an iron level drawn today as well as a CBC with differential

## 2010-11-24 NOTE — Assessment & Plan Note (Signed)
She has a history of esophageal cancer with a pull-through reconstruction she has chronic reflux which has resulted sometimes an aspiration pneumonia however she is stable currently on her current medications she does require long-term use of Reglan as it has been only medication is effective in controlling her reflux symptoms

## 2010-11-25 ENCOUNTER — Other Ambulatory Visit: Payer: Self-pay | Admitting: *Deleted

## 2010-11-25 DIAGNOSIS — I10 Essential (primary) hypertension: Secondary | ICD-10-CM

## 2010-11-25 MED ORDER — BISOPROLOL-HYDROCHLOROTHIAZIDE 2.5-6.25 MG PO TABS: 1.0000 | ORAL_TABLET | Freq: Every day | ORAL | Status: DC

## 2010-11-30 ENCOUNTER — Encounter: Payer: Self-pay | Admitting: Internal Medicine

## 2010-12-14 NOTE — Assessment & Plan Note (Signed)
[facility]                         GASTROENTEROLOGY OFFICE NOTE   NAME:[name], [name]                  MRN:          [id number]  DATE:11/02/2007                            DOB:          1928/08/31    PRIMARY CARE PHYSICIAN:  Dr. [name].   GI PROBLEM LIST:  1. Status post esophagectomy and gastric pull up for esophageal      cancer, 1993.  2. Chronic GERD.  Dyspepsia, likely much of it from #1.  EGD January      2009 showed high anastomosis (18 cm from incisors), ring-like      narrowing at the anastomosis, but non-adenomatous appearing.  No      esophagitis.  No Barrett's changes.  3. Colon polyp removed October 2005.  Next colonoscopy due October      2010.   INTERVAL HISTORY:  I last saw [name] at the time of her EGD about a  month and a half ago.  Since then, she ran out of her Prevacid solu  tabs, and she has been taking just 1 pill twice a day rather than her  usual 2 pills twice a day.  She said that she has been fairly  symptomatic from acid reflux standpoint.  She does take her Pepcid at  night before bed.  She has a lot of trouble with sinus congestion.  That  is her biggest complaint today.   CURRENT MEDICATIONS:  1. Carafate 3 times a day.  2. Prevacid solu tabs 60 mg twice daily.  3. Hemocyte.  4. Mucinex.  5. Vitamin B.  6. Viactiv.  7. Reglan 5 mg twice daily.  8. Vitamin C.  9. Ziac.  10.Tazol.  11.Tussionex as needed.   PHYSICAL EXAMINATION:  Weight 133 pounds, which is up 3 pounds since her  last office visit.  Blood pressure 132/82.  Pulse 68.  CONSTITUTIONAL:  Generally well appearing.  ABDOMEN:  Soft, nontender, and non-distended.  Normal bowel sounds.   ASSESSMENT AND PLAN:  A 77 year old woman with chronic gastroesophageal  reflux disease symptoms likely related to her esophagogastrectomy in  1993.   She does have severe acid symptoms, but they seem to be under fairly  good control on high-dose  twice-daily PPIs as well as H2 blocker at  night, and Carafate periodically.  She will continue this regimen  indefinitely.  She knows to call if she has any further troubles.     Rachael Fee, MD  Electronically Signed    DPJ/MedQ  DD: 11/02/2007  DT: 11/02/2007  Job #: [id number]   cc:   Stacie Glaze, MD

## 2010-12-14 NOTE — Assessment & Plan Note (Signed)
[facility]                         GASTROENTEROLOGY OFFICE NOTE   NAME:NICHOLSONTameya, Kuznia                  MRN:          [id number]  DATE:08/14/2007                            DOB:          [birth year]    PRIMARY CARE PHYSICIAN:  Stacie Glaze, MD   GI PROBLEM LIST:  1. Status post esophagectomy and gastric pull up for esophageal      cancer, 1993.  2. Chronic GERD.  Dyspepsia, likely much of it from #1.  3. Colon polyp removed October 2005.  Next colonoscopy due October      2010.   INTERVAL HISTORY:  I last saw Jolane about a year ago. Since then, she  has had a real resurgence of her GERD symptoms. She is having nightly  acid pyrosis and some regurgitation of liquid contents. She has had  problems with this for years and really has modified her life to avoid  exacerbating factors. She sleeps sitting up. She eats no caffeine. She  has very little foods after 1 or 2 in the evening. She does however take  a peppermint once daily.   CURRENT MEDICATIONS:  1. Carafate.  2. Prevacid.  3. SolTab twice daily.  4. Viactiv.  5. Hemocyte.  6. Mucinex.  7. Vitamin D.  8. Reglan 5 mg.   PHYSICAL EXAMINATION:  Weight 130 pounds, which is up 2 pounds since her  last visit. Blood pressure 110/68, pulse 60.  CONSTITUTIONAL: Generally well-appearing.  NEUROLOGIC: Alert and oriented x3.  ABDOMEN: Soft and nontender, nondistended. Normal bowel sounds.   ASSESSMENT/PLAN:  A 77 year old woman with chronic gastroesophageal  reflux disease symptoms likely related to her esophagogastrectomy in  1993.   I will arrange for her to have an EGD performed in the next 2-3 weeks.  In the meantime, she will double her Prevacid so she will be taking 60  mg twice a day. I have recommended that she take this 20-30 minutes  prior to her breakfast and dinner meals as that is the way the pills are  designed to work most effectively. She will all add a single Pepcid  taken at bedtime. This is  maximum medical control of her symptoms. I will also give her another  gastroesophageal reflux disease handout for her review.     Rachael Fee, MD  Electronically Signed    DPJ/MedQ  DD: 08/14/2007  DT: 08/14/2007  Job #: [id number]   cc:   Stacie Glaze, MD

## 2010-12-17 NOTE — Consult Note (Signed)
[name], [name] NO.:  [id number]   MEDICAL RECORD NO.:  [id number]          PATIENT TYPE:  INP   LOCATION:  0440                         FACILITY:  [facility]   PHYSICIAN:  Iva Boop, M.D. LHCDATE OF BIRTH:  04-20-1929   DATE OF CONSULTATION:  06/08/2004  DATE OF DISCHARGE:                                   CONSULTATION   REFERRING PHYSICIAN:  Dr. Gordy Savers.   REASON FOR CONSULTATION:  Recurrent reflux/aspiration problems.   HISTORY:  [name] is a 77 year old white woman known to me from  previous hospitalization last month.  At that point, she had had problems  with reflux and had aspiration pneumonia.  Prior upper GI series earlier  this year and about 4 years ago demonstrated some gastroesophageal junction  narrowing, and some slow emptying of barium.  The patient is status post  resection for esophageal cancer by Dr. Sheliah Plane with gastric pull-  through.  I dilated her esophagogastric junction to see if that would help  at all.  I reviewed her films at that time.  I questioned whether or not  there might be some sort of volvulus effect going on, though there is really  no proof of that.  She did reasonably well, but then had another episode of  reflux at nighttime and subsequently developed dyspnea and cough, and is now  admitted with aspiration pneumonia again.  She is being treated with  antibiotics at this time with some improvement.  She is quite concerned  about how we can stop this problem.  She was on metoclopramide, but that was  discontinued due to tremor which we think is exacerbated by that, and she  tells me Dr. Genene Churn. Love agreed with that, and that she should not restart  that.  She is intolerant of erythromycin.  She is currently on Zelnorm 6 mg  b.i.d. and Aciphex 20 mg b.i.d. prior to admission as well as some Carafate.  She says that she eats lunch and then never has any food until the next day;  she  essentially eats breakfast and lunch every day, but stops eating food  about 12:30.  She has been sleeping with the head of bed raised about 45  degrees, but is now considering sleeping up in a chair.  It seems like most  of these reflux episodes occur while she is asleep in the bed.   PAST MEDICAL HISTORY:  Past medical history includes:  1.  Cholecystectomy.  2.  Esophageal cancer surgeries.  3.  Benign breast disease.  4.  Recurrent aspiration pneumonia.  5.  Resection of a duodenal adenoma as well, endoscopic.   SOCIAL HISTORY:  She works part-time, lives alone, no alcohol or tobacco.   MEDICATIONS:  Medications here are Zelnorm, Lovenox, Protonix, Carafate,  Atrovent, albuterol and clindamycin.   PHYSICAL EXAM:  GENERAL/VITAL SIGNS:  Physical exam reveals a slightly  anxious, elderly white woman with temperature of 99.5, blood pressure  110/68, pulse 60.  EYES:  Anicteric.  NECK:  Supple.  CHEST:  It sounds clear to me at this time.  ABDOMEN:  The abdomen is soft and nontender without organomegaly or mass.  HEART:  S1 and S2.  No rubs or gallops.  ABDOMEN:  There is no succussion splash in the abdomen either.  SKIN:  Skin slightly pale.  No rash.   LABORATORY DATA:  White count 13.6, down to 5.7, hemoglobin 13.6, down to  11.1.  CK is negative.  UA negative.  She has Hemoccult-negative stool.  D-  dimer is slightly high at 0.67.  She has a BNP that is 89.   ASSESSMENT:  Recurrent aspiration pneumonia.  Difficult situation in that  the woman is status post esophagogastric pull-through for esophageal cancer.  She almost certainly has some short of motility disturbance and difficulty  with gastric emptying.  She is intolerant of Reglan due to tremor, which is  evident on physical exam, though I did not mention it above; she has a  resting tremor of the right upper extremity.  She also has some tremor of  the chin it seems.  It is not clear that much else can be done at this   point.  I attempted to dilate her gastroesophageal junction to see if that  would help.   PLAN:  1.  Continue current medications.  2.  Consider further evaluation with a repeat upper GI series and/or gastric      emptying study, though at this point, I am not sure what that will help      [place] with.  I do not think there is any chance she can have any further      surgery at this time.  3.  Sitting upright at night is not unreasonable.  She is already taking      aggressive reflux measures as best she can.   I appreciate the opportunity to care for this patient.      CEG/MEDQ  D:  06/08/2004  T:  06/09/2004  Job:  [id number]   cc:   Ulyess Mort, M.D. [facility]   Stacie Glaze, M.D. [facility]

## 2010-12-17 NOTE — H&P (Signed)
NAMEOTHA, [name] NO.:  [id number]   MEDICAL RECORD NO.:  [id number]          PATIENT TYPE:  INP   LOCATION:  0455                         FACILITY:  [facility]   PHYSICIAN:  Wanda Plump, MD [facility]    DATE OF BIRTH:  [birth year]   DATE OF ADMISSION:  05/02/2004  DATE OF DISCHARGE:                                HISTORY & PHYSICAL   CHIEF COMPLAINT:  Nausea and vomiting.   PRIMARY CARE PHYSICIAN:  Dr. Lovell Sheehan from [facility].   HISTORY OF PRESENT ILLNESS:  [name] is a 77 year old white female who  at 4 a.m. today developed severe nausea and vomiting along with reflux  symptoms. She was unable to tolerate any breakfast. Three days ago, she was  started on a new medication called Requip which was started for 53-month  history of right upper extremity tremor. She could not tell if the Requip  helped any with these symptoms. In the ER, she was found to have a  pneumonia, was afebrile and dehydrated, was admitted for further treatment.   PAST MEDICAL HISTORY:  1.  Status post resection for esophageal cancer 10 years ago. 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  2.  In February of this year, she was evaluated by her GI specialist for      some problems with swallowing. At that time, an upper GI and small-bowel      follow through showed some stenosis of the esophagogastric anastomosis      without any obstruction and some delaying of small-bowel transit but no      obvious obstruction. The swallowing mechanism was intact.  3.  Cholecystectomy 3 or 4 years ago.  4.  History of benign breast biopsy years ago.  5.  No history of hypertension, diabetes, or coronary artery disease.   FAMILY HISTORY:  Noncontributory.   SOCIAL HISTORY:  Does not smoke or drink.   REVIEW OF SYSTEMS:  She developed fever today. She is coughing some  yellowish sputum. Her shortness of  breath is at baseline. She denies any  chest pain, headache, slurred speech, facial weakness, or lower extremity  edema. She has not been able to urinate today.   MEDICATIONS:  1.  Aciphex.  2.  Reglan.  3.  Carafate.  4.  Requip for 3 days.   ALLERGIES:  No known drug allergies.   PHYSICAL EXAMINATION:  GENERAL:  The patient is alert and oriented in no  apparent distress.  VITAL SIGNS:  Temperature is 101.6, pulse 123, blood pressure 150/100,  respirations 24, O2 saturation on room air was 91%.  LUNGS:  Show decreased breath sounds at right base, few fine crackles at the  left base. There is no increased worked of breathing.  CARDIOVASCULAR:  Tachycardic without murmur.  ABDOMEN:  Nondistended, soft, slightly decreased bowel sounds, no tenderness  or mass.  EXTREMITIES:  No edema.  NEUROLOGICAL:  The patient is coherent, cooperative. Her motor is symmetric  except  for the tremor at the right arm which is worse at rest.  NECK:  Full range of motion.   LABORATORY DATA:  White count is 18, platelets 301, hemoglobin 12.7,  potassium 3.5, creatinine 0.8, BUN 9, blood sugar 166. Chest x-ray shows  bilateral infiltrates with a question of aspiration. She did have a fluid  level at the esophageal area on the plain chest x-ray.   ASSESSMENT/PLAN:  1.  Infectious disease. The patient is admitted with fever and elevated      white count as well as a pneumonia on the chest x-ray which is likely an      aspiration. She was started on Rocephin and Zithromax in the ER, but I      will switch her to Timentin IV.  2.  Gastrointestinal. She has rather sudden of nausea and vomiting and      worsening of her reflux symptoms. This could have been precipitated by      the use of Requip which according with the literature has interaction      with Reglan in which the prokinetic effect of Reglan is decreased. Will      make the patient NPO. I already called GI who will see her in the      morning  if there is any problems.  3.  Tremor. She is scheduled to have an outpatient evaluation.  4.  Dehydration, tachycardia. Will provide IV fluids.  5.  She will be started on Lovenox for deep vein thrombosis prophylaxis.      JEP/MEDQ  D:  05/02/2004  T:  05/03/2004  Job:  [id number]

## 2010-12-17 NOTE — Assessment & Plan Note (Signed)
[facility]                               PULMONARY OFFICE NOTE   NAME:NICHOLSONSedonia, [name]                  MRN:          [id number]  DATE:02/10/2006                            DOB:          Jun 14, 1929    Ms. [name] is a 77 year old white female with primarily gastroparesis and  reflux disease, resulting in lower airway inflammation from chronic  microaspiration.  Pulmonary-wise, the patient is improved with no recent  respiratory complaints following more aggressive GI therapy by Dr. Corinda Gubler.  The patient is on Reglan therapy now daily.  Unfortunately, Zelnorm was  taken off the market, this was beneficial to her.  She is on Prevacid 30 mg  b.i.d.  Pulmonary-wise she has weaned off the Asmanex therapy.   PHYSICAL EXAMINATION:  Temp is 98, blood pressure 110/70, pulse 82,  saturation 96% on room air.  CHEST:  Completely clear without evidence of wheeze or rhonchi.  CARDIAC:  Exam showed a regular rate and rhythm without S3, normal S1, S2.  ABDOMEN:  Soft, nontender.  EXTREMITIES:  Showed no edema or clubbing.   IMPRESSION:  Stable lower airway inflammation, now that she no longer is  significantly microaspirating.   PLAN:  We will see this patient back in expectant followup as needed.  No  additional pulmonary workup or treatment is indicated at this time.                                   Charlcie Cradle Delford Field, MD, [facility]   PEW/MedQ  DD:  02/10/2006  DT:  02/11/2006  Job #:  [id number]   cc:   Erica Mort, MD  Erica Glaze, MD

## 2010-12-17 NOTE — Discharge Summary (Signed)
NAMESAAVI, [name]           ACCOUNT NO.:  [id number]   MEDICAL RECORD NO.:  [id number]          PATIENT TYPE:  INP   LOCATION:  0440                         FACILITY:  [facility]   PHYSICIAN:  Rene Paci, M.D. LHCDATE OF BIRTH:  01-Jun-1929   DATE OF ADMISSION:  06/05/2004  DATE OF DISCHARGE:  06/11/2004                                 DISCHARGE SUMMARY   DISCHARGE DIAGNOSES:  1.  Left lower-lobe pneumonia secondary to recurrent aspiration, improved.      Tolerating p.o. antibiotics and on room air oxygen with good saturation.  2.  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  3.  History of esophagectomy secondary to esophageal cancer (remote gastric      emptying study pending on the day of discharge, status post GI      reevaluation this admission).  Outpatient followup with Dr. Ulyess Mort, to consider referral to [facility] to see Dr.      Jess Barters for evaluation and further suggestions.  4.  Benign essential tremor, at baseline, outpatient followup with      neurology, Dr. Sandria Manly p.r.n.   DISCHARGE MEDICATIONS:  1.  Avelox 400 mg p.o. daily x8 additional days to complete a two-week      course.  2.  Tussionex cough syrup one teaspoon q.12h. p.r.n. cough.  3.  Ambien 5 mg p.o. q.h.s. p.r.n. insomnia.  4.  Zelnorm 6 mg p.o. b.i.d.  5.  Aciphex 20 mg p.o. b.i.d.  6.  Carafate 1 gram q.i.d. a.c. and h.s.   DISPOSITION:  The patient is discharge to home in medically stable  condition, tolerating her regular medications and understands plans for  followup.   FOLLOWUP:  1.  Hospital followup is scheduled with primary care physician, Dr. [name], for Tuesday, November 15, at 10:15 a.m. to review this      information and evaluate overall respiratory status.  2.  With GI physician, Dr. Ulyess Mort on December 14 as previously      scheduled.  She is to call for further problems with her reflux  prior to      that time.   DISCHARGE INSTRUCTIONS:  1.  The patient is to continue her low-residue, low-fiber diet with sitting      upright, and no supine positioning even at rest.  2.  May use Ambien as needed for insomnia.   HOSPITAL COURSE BY PROBLEM:  Problem #1. Recurrent pneumonia.  The patient  is a pleasant but unfortunate 77 year old woman who has severe reflux  secondary to motility disorder from prior esophagectomy, who was admitted on  the day of admission with cough, chest pain and fever.  She was found to  have new evidence of pneumonia by chest x-ray and elevated white count, with  fever of 101, and was admitted for IV antibiotics and recurrent pneumonia.  She defervesced quickly with IV clindamycin and her O2 sats gradually  improved.  She was treated with Tussionex for cough syrup.  She was changed  to p.o. antibiotics after the 4th day of IV antibiotics, and Tussionex was  added for cough syrup p.r.n.  Her white count resolved to normal, last check  at 5.7 and chest x-ray showed evidence of clearing.  Regarding the  infectious standpoint, she is stable for discharge home.  Please see problem  next for other details.   Problem #2.  Motility disorder secondary to severe reflux.  This has been an  ongoing, longstanding problem for this patient with extensive evaluation by  GI.  She has history of esophagectomy due to prior cancer, and with this  loss of sphincter tone, has had severe reflux and is unable to lay supine.  This is now her second episode of aspiration pneumonia.  GI was again  consulted, who felt there was little else to be recommended at this time.  Another gastric emptying study was pursued at this hospitalization, but the  results are still pending at the time of discharge.  Suggestions have been  made as previous.  Of note, her last EGD with treatment was in  hospitalization October 2005, by Dr. Iva Boop.  Please see that  dictation summary for  further details.  GI will consider outpatient referral  to [facility] as listed above, to consider other options and treatment, such as  question any surgical intervention.   Continue medications as prior to admission as they are listed above.  Added  Ambien for help sleeping as she is unable to lay supine.     [place]   VL/MEDQ  D:  06/11/2004  T:  06/12/2004  Job:  [id number]

## 2010-12-17 NOTE — H&P (Signed)
NAMEMarland Kitchen  [name], [name] NO.:  [id number]   MEDICAL RECORD NO.:  [id number]          PATIENT TYPE:  EMS   LOCATION:  ED                           FACILITY:  [facility]   PHYSICIAN:  Gordy Savers, M.D. LHCDATE OF BIRTH:  [birth year]   DATE OF ADMISSION:  06/05/2004  DATE OF DISCHARGE:                                HISTORY & PHYSICAL   CHIEF COMPLAINT:  The patient is a 77 year old white female with a chief  complaint of shortness of breath.   HISTORY OF PRESENT ILLNESS:  The patient was stable until approximately 2  a.m. on the day of admission when she had episode of nausea and vomiting.  She had onset of dyspnea and coughing.  She has been coughing up  considerable mucoid material, and has developed worsening fever, chills, and  shortness of breath.  The patient was hospitalized here on May 02, 2004  after an episode of aspiration pneumonia related to intractable nausea and  vomiting.  The patient has a history of prior resection for esophageal  cancer approximately 10 years ago, and during the last hospital admission  underwent repeat esophagogastroduodenoscopy that showed no significant  obstruction.  The patient was treated with Zofran and pulmonary toilet, and  did well.  The patient had moderate stenosis at the junction, but no  significant stricture, and the distal esophagus was dilated to 18 mm.   In the emergency room, the patient was noted to be slightly hypoxic on  admission with tachycardia, and noted to have a leukocytosis.  Chest x-ray  was normal.  The patient is now admitted for further evaluation and  treatment of suspected early aspiration pneumonia.   PAST MEDICAL HISTORY:  The patient has also had a remote cholecystectomy  about 4 years ago.  She has a history of benign breast disease status post  biopsy a number of years ago.   PRESENT MEDICAL REGIMEN:  1.  She has recently completed Omnicef yesterday.  2.  She is on iron sulfate 325  t.i.d.  3.  Zelnorm 6 mg b.i.d.  4.  Aciphex 20 mg b.i.d.  5.  Carafate 1 gm 2-3 times daily.   ALLERGIES:  ERYTHROMYCIN.   SOCIAL HISTORY:  She lives alone.  Does not smoke or drink.   REVIEW OF SYSTEMS:  Otherwise unremarkable.  She was stable until the onset  of her present illness.   PHYSICAL EXAMINATION:  GENERAL:  A well-developed elderly white female who  is alert, cooperative, slightly anxious, but in no apparent distress.  VITAL SIGNS:  Temperature is 101 degrees, pulse rate 110, blood pressure  normal.  HEAD AND NECK:  Normal fundi.  Ears, nose, and throat clear.  Mucous  membranes were well hydrated.  Neck had no bruits or adenopathy.  CHEST:  Revealed the right chest to be clear.  The patient had rales and  rhonchi involving the left lower lung field.  CARDIOVASCULAR:  Slightly resting tachycardia.  No murmur.  ABDOMEN:  A midline scar.  There was no distention or tenderness.  Breath  sounds were present.  EXTREMITIES:  Unremarkable without edema.  Peripheral pulses were full.  NEUROLOGIC:  A resting tremor involving the right hand greater than the  left.  She also had a tremor involving the lower lip and buccal area.   IMPRESSION:  1.  Early aspiration pneumonia.  2.  Recurrent nausea and vomiting.  3.  Tremor, essential versus secondary to Reglan treatment.   DISPOSITION:  The patient will be admitted to the hospital.  Support with IV  fluids and oxygen.  She will be treated with pulmonary toilet and receive  antibiotic therapy.  Follow up chest x-ray will be obtained in 1-2 days.      PFK/MEDQ  D:  06/05/2004  T:  06/05/2004  Job:  [id number]

## 2010-12-17 NOTE — Procedures (Signed)
[facility]  Patient:    [name], [name]                  MRN: [id number] Proc. Date: 04/11/00 Adm. Date:  [id number] Attending:  Estella Husk                           Procedure Report  PROCEDURE:  Esophagogastroduodenoscopy.  INDICATIONS FOR PROCEDURE:  Follow-up removal of duodenal adenoma.  HISTORY OF PRESENT ILLNESS:  This is a 77 year old who underwent endoscopic removal of the duodenal adenoma November 23, 1999. She is now for follow-up to rule out recurrent neoplasia. The nature of the procedure as well as the risks, benefits, and alternatives were reviewed. She understood and agreed to proceed.  PHYSICAL EXAMINATION:  GENERAL:  Well appearing female in no acute distress. She is alert and oriented.  VITAL SIGNS:  Stable.  LUNGS:  Clear.  HEART:  Regular.  ABDOMEN:  Soft.  DESCRIPTION OF PROCEDURE:  After informed consent was obtained, the patient was sedated with 50 mg of Demerol and 6 mg of Versed IV. The Olympus endoscope was passed orally under direct vision into the esophagus. The proximal esophageal remnant of approximately 2 cm was observed. The esophagogastric junction was at 16 cm from the incisors. The stomach revealed diffuse changes of chronic gastritis. The duodenal bulb was normal. The post bulbar duodenum was carefully examined. No evidence of residual neoplasia near or around the areas or resection.  IMPRESSION: 1. Status post esophagectomy for esophageal cancer. 2. No evidence of residual duodenal adenoma. 3. Chronic gastritis.  RECOMMENDATIONS:  GI follow-up with Dr. [name]. No further endoscopies planned unless otherwise clinically indicated. DD:  04/11/00 TD:  04/12/00 Job: [id number]

## 2010-12-17 NOTE — Assessment & Plan Note (Signed)
[facility]                         GASTROENTEROLOGY OFFICE NOTE   NAME:[name], [name]                  MRN:          [id number]  DATE:09/26/2006                            DOB:          04-27-1929    GI FOLLOWUP NOTE   GI PROBLEM LIST:  1. Status post esophagectomy and gastric pull up for esophageal      cancer, 1993.  2. Chronic GERD. Dyspepsia, likely much of it from #1.  3. Colon polyp removed October 2005.  Next colonoscopy due October      1010.   MEDICAL HISTORY:  This is the first time I have met Ms. [name].  She  is normally cared for by Dr. [name].  She had esophageal cancer  resected 15 years ago and has done incredibly well since then.  She does  have a lot of upper GI symptoms probably from acid refluxing high up  into her neoesophagus and throat.  Currently, these symptoms are  generally well controlled by dietary modifications (she does not eat any  food after noon), lifestyle modifications (she does not do a lot of  exercises, does not bend over much), as well as medicines (she is on  Carafate, Reglan, and Prevacid).  She is here today mainly just for  medication refills of several of these medicines.   CURRENT MEDICATIONS:  1. Carafate.  2. Prevacid 30 mg twice daily.  3. Viactiv.  4. Hemocyte.  5. Mucinex.  6. Vitamin D.  7. Reglan 5 mg t.i.d.   PHYSICAL EXAM:  Weight 128 pounds which is up 8 pounds since her visit 1  year.  Blood pressure 130/80.  Pulse 85.  CONSTITUTIONAL:  Generally well appearing.  LUNGS:  Clear to auscultation bilaterally.  HEART:  Regular rate and rhythm.  ABDOMEN:  Soft, nontender, nondistended.  Normal bowel sounds.   ASSESSMENT AND PLAN:  A 77 year old woman with upper gastrointestinal  symptoms likely resulting from esophagectomy with gastric pull-up in  1993.   She is doing well on her current regimen of medicines and I think there  is no reason to change them.  I have,  therefore, given her a  prescription for Reglan and Prevacid.  She gets her  Carafate filled by another [name].  She knows to get back in touch if  she has any further questions or concerns, otherwise I will see her in  about a year.     Rachael Fee, MD  Electronically Signed    DPJ/MedQ  DD: 09/26/2006  DT: 09/26/2006  Job #: [id number]   cc:   Stacie Glaze, MD  Rachael Fee, MD

## 2010-12-17 NOTE — Consult Note (Signed)
[name], FLAMM NO.:  [id number]   MEDICAL RECORD NO.:  [id number]          PATIENT TYPE:  INP   LOCATION:  0455                         FACILITY:  [facility]   PHYSICIAN:  Iva Boop, M.D. LHCDATE OF BIRTH:  [birth year]   DATE OF CONSULTATION:  05/03/2004  DATE OF DISCHARGE:                                   CONSULTATION   REASON FOR CONSULTATION:  Reflux, question gastric outlet obstruction.   HISTORY:  This is a pleasant 77 year old white woman that was admitted with  bilateral pneumonia felt to be related to aspiration. She has been having  worsening problems with reflux over the past several months. Having a lot of  coughing. She was seen by Dr. [name] in February at which point upper  GI, small bowel followthrough showed intact anastomosis but slight narrowing  at the pyloroduodenal junction. The patient has previously had a diagnosis  of esophageal cancer cured by esophagectomy (Dr. Tyrone Sage) and gastric  pullthrough operation.  She has been on Aciphex b.i.d. and Reglan 10 mg  t.i.d. and sleeps with the head of the bed elevated (electric bed) and sits  upright after meals.  Last week had a severe reflux episode approximately  five days ago and then again the day of admission with associated nausea and  vomiting and developed a high fever which brought her to the hospital where  chest x-ray demonstrated bilateral pneumonia.  Her white count was 18,000.  There was a fluid level in the area of gastric pullthrough on chest x-ray.   MEDICATIONS AT HOME:  1.  Aciphex 20 mg b.i.d.  2.  Reglan 10 mg t.i.d.  3.  Requip q.d.  4.  Carafate with slurry t.i.d.  5.  Iron.  6.  B12.   ALLERGIES:  No known drug allergies.   PAST SURGICAL HISTORY:  As above.   PAST MEDICAL HISTORY:  As above otherwise status post cholecystectomy,  status post duodenal adenoma resection (polypectomy), status post benign  breast biopsy. History of irritable bowel  syndrome.   FAMILY HISTORY:  No gastrointestinal problems.   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   REVIEW OF SYMPTOMS:  Pulmonary as above with cough and fever, some mild  dyspnea though she feels better since admission.  She has chronic tremors  with a history of fracture of the elbow and wrist on the right.  Reading  glasses.  All other systems appear negative.   Note that erythromycin causes an irregular heartbeat.   PHYSICAL EXAMINATION:  GENERAL:  Reveals a pleasant elderly white woman  looking younger than stated age.  VITAL SIGNS:  Temperature is 106 maximum today, blood pressure 112/60, pulse  88, saturation 97% on 2 liters.  HEENT:  Eyes anicteric, mouth free of lesions, posterior pharynx clear.  NECK:  Supple with no mass.  CHEST:  Clear except for a few crackles at the bases. There is no wheezes.  Good air movement.  ABDOMEN:  Soft, nontender without organomegaly or mass. There is a large  sternal  and epigastric scar.  HEART:  S1, S2. No murmurs, rubs or gallops.  EXTREMITIES:  Without edema.  SKIN:  Slight pale without rash.  NEUROLOGIC:  She has arresting tremor of the right upper extremity and some  pill rolling movement there. She is alert and oriented x3.  LYMPH NODES:  No neck or supraclavicular nodes are palpated.   LABORATORY DATA:  Hemoglobin 11, MCV 75, white count 18-14.3, coag's normal.  Electrolytes and LFT's normal. Blood cultures are pending. Chest x-ray shows  bilateral lower lobe infiltrates in the air fluid level in the gastric  pullthrough.   ASSESSMENT:  1.  Worsening reflux problems in a woman whose status post subtotal      esophagectomy with gastric pullthrough. This is all suspicious for      gastric outlet obstruction and particularly given the upper GI series      from earlier this year.  2.  Aspiration pneumonia thought secondary to #1.  3.  Microcytic  anemia.  Suspect iron deficiency. Question absorption      difficulty.  4.  Prior duodenal adenoma with resection.  5.  Tremor.   PLAN:  I have discussed the situation with her.  Initially she only wanted  Dr. [name] to perform her procedure but she is agreeable to upper GI  endoscopy with possible pyloric dilation by me. Dr. [name] is not  available for at least a week to do this procedure. I think her pneumonia  status is improving significantly in that it is not unreasonable to sedate  her and perform the procedure tomorrow assuming she continues to improve or  at least stay at the status quo.   Additionally will continue Reglan and Carafate and b.i.d. proton pump  inhibitor.  Elevate the head of the bed.  Pending further evaluation of her  anemia, a colonoscopy could be indicated. It is not clear to me when she had  her last colonoscopy though we will double check that.   I appreciate the opportunity to care for this patient.      CEG/MEDQ  D:  05/03/2004  T:  05/04/2004  Job:  [id number]   cc:   Ulyess Mort, M.D. [facility]   Stacie Glaze, M.D. [facility]

## 2010-12-17 NOTE — Discharge Summary (Signed)
[name], [name]           ACCOUNT NO.:  [id number]   MEDICAL RECORD NO.:  [id number]          PATIENT TYPE:  INP   LOCATION:  0455                         FACILITY:  [facility]   PHYSICIAN:  Rene Paci, M.D. LHCDATE OF BIRTH:  [birth year]   DATE OF ADMISSION:  05/02/2004  DATE OF DISCHARGE:  05/06/2004                                 DISCHARGE SUMMARY   DISCHARGE DIAGNOSES:  1.  Nausea, vomiting secondary to transient obstruction, resolved.  No major      stenosis or stricture by EGD.  2.  History of partial esophagectomy secondary to remote cancer.  Stenosis      at junction without significant stricture.  Status post slight dilation      to 18 mm by EGD, October 4.  3.  Aspiration pneumonia secondary to nausea, vomiting.  Continued p.o.      antibiotics.  O2 saturations stable.  Afebrile.  4.  Iron deficiency anemia fob negative.  EGD and colon negative for a      source of bleed.  Increased p.o. iron.  5.  Essential tremor, question exacerbation by Requip plus/minus Reglan.      Discontinue each of these.  Outpatient neurologic follow up p.r.n.   DISCHARGE MEDICATIONS:  1.  Discontinuation of Reglan and Requip just prior to admission.  2.  Iron 325 p.o. t.i.d.  3.  Zelnorm 10 mg p.o. b.i.d.  4.  Avelox 400 mg p.o. daily x 5 additional days to complete 10 day      antibiotic course.  5.  Aciphex 20 mg p.o. daily.  6.  Carafate 1 mg b.i.d. to t.i.d. p.r.n.   DISPOSITION:  The patient is discharged home in medically stable condition.  No nausea, vomiting since prior to admission, status post EGD and  colonoscopy as noted below.  Outpatient follow up is arranged with GI Dr.  Victorino Erica for November 7 at 2 p.m. and with primary care physician, Dr.  Darryll [name] as needed.  Consider outpatient evaluation for essential tremor  depending on symptoms with discontinuation of these medications.   HOSPITAL COURSE BY PROBLEM:  #1 - NAUSEA AND VOMITING:  The patient is a  pleasant 77 year old woman admitted on the day of admission, who developed  severe nausea, vomiting with increased reflux symptoms.  Because of  intolerance to tolerate p.o.'s, she came to the emergency room where x-rays  showed question air fluid level at distal esophagus.  Because of this  question of gastric outlet obstruction, she was admitted, and GI saw the  patient in consultation.  She does have history of previous distal  esophagectomy secondary to cancer, and there is concern for stricture and  the site of this anastomosis.  EGD was performed which showed no significant  stricture; no dilation was undertaken.  It was not felt to be the cause of  her symptoms.  Question underlying motility disorder and consider gastric  emptying study to further evaluate if symptoms recur.  Of note, her Reglan  was changed to Zelnorm because of question contribution to increased tremor  with increased dose of Reglan.  Please see problems  below.   #2 - ASPIRATION PNEUMONIA:  The patient had low-grade fever, coughing, and  mild desaturation.  Chest x-ray showed bilateral infiltration, question of  pneumonitis versus aspiration pneumonia from nausea and vomiting.  She was  begun initially on Zosyn and then changed to p.o. Avelox that she has  tolerated without difficulty.  She will continue a 10 day apparent course of  this.  She has been afebrile, and her O2 saturations are stable at this  time.   #3 - IRON DEFICIENCY ANEMIA:  Iron studies were consistent for the deficient  state.  The patient was fob negative because of the ongoing endoscopy.  She  was evaluated because of a history of adenoma.  No evidence could be found.  Her iron was increased from once daily to 3 times daily.  Outpatient follow  up with GI and primary care as needed.   #4 - ESSENTIAL TREMOR:  The patient has an increase of her resting tremor,  greatest on the right side with two recent changes including the addition of   Requip plus the increased dose of Reglan several weeks ago.  The patient  says she is tentatively scheduled to see neurology as an outpatient and  during this hospitalization, both Requip and Reglan were discontinued.  At  the time of discharge, symptomatically, her tremor seems to be less than  previous, though it is still detectable.  Consider further outpatient  neurology evaluation as prior to admission based on symptoms.   LABORATORY DATA AT TIME OF DISCHARGE:  Iron 20, % saturations 7%, TIBC 292.  Coags normal.  White count 7.2, hemoglobin 11.0, platelets 246, CMET normal.     [place]   VL/MEDQ  D:  05/06/2004  T:  05/06/2004  Job:  [id number]

## 2010-12-21 ENCOUNTER — Telehealth: Payer: Self-pay | Admitting: Internal Medicine

## 2010-12-21 NOTE — Telephone Encounter (Signed)
Had chest xray on Sunday. They told her that it maybe pneumonia. May need mri. Wants to speak with [facility] concerning this.

## 2010-12-21 NOTE — Telephone Encounter (Signed)
Pt will bring her paper work from [place] office so Dr. Lovell Sheehan will know what she needs.

## 2010-12-22 ENCOUNTER — Ambulatory Visit (INDEPENDENT_AMBULATORY_CARE_PROVIDER_SITE_OTHER)
Admission: RE | Admit: 2010-12-22 | Discharge: 2010-12-22 | Disposition: A | Discharge status: Home / Self Care | Payer: Medicare Other | Source: Ambulatory Visit | Attending: Internal Medicine | Admitting: Internal Medicine

## 2010-12-22 ENCOUNTER — Other Ambulatory Visit: Payer: Self-pay | Admitting: *Deleted

## 2010-12-22 DIAGNOSIS — R9389 Abnormal findings on diagnostic imaging of other specified body structures: Secondary | ICD-10-CM

## 2010-12-22 DIAGNOSIS — R918 Other nonspecific abnormal finding of lung field: Secondary | ICD-10-CM

## 2010-12-22 NOTE — Telephone Encounter (Signed)
Received cxr from [facility]-per dr Lovell Sheehan- since they are not [facility] affiliated they didn't have cxr to compare to,  Dr Lovell Sheehan would like pt to go get a cxr at [place]- pt informed and order in computer

## 2010-12-22 NOTE — Telephone Encounter (Signed)
Order in computer

## 2011-02-22 ENCOUNTER — Ambulatory Visit (INDEPENDENT_AMBULATORY_CARE_PROVIDER_SITE_OTHER): Payer: Medicare Other | Admitting: Internal Medicine

## 2011-02-22 ENCOUNTER — Encounter: Payer: Self-pay | Admitting: Internal Medicine

## 2011-02-22 VITALS — BP 140/80 | HR 72 | Temp 98.2°F | Resp 16 | Ht 63.0 in | Wt 122.0 lb

## 2011-02-22 DIAGNOSIS — R062 Wheezing: Secondary | ICD-10-CM

## 2011-02-22 DIAGNOSIS — Z23 Encounter for immunization: Secondary | ICD-10-CM

## 2011-02-22 DIAGNOSIS — R05 Cough: Secondary | ICD-10-CM

## 2011-02-22 DIAGNOSIS — I10 Essential (primary) hypertension: Secondary | ICD-10-CM

## 2011-02-22 DIAGNOSIS — Z0000 Encounter for general adult medical examination without abnormal findings: Secondary | ICD-10-CM

## 2011-02-22 DIAGNOSIS — J690 Pneumonitis due to inhalation of food and vomit: Secondary | ICD-10-CM

## 2011-02-22 DIAGNOSIS — D509 Iron deficiency anemia, unspecified: Secondary | ICD-10-CM

## 2011-02-22 DIAGNOSIS — K219 Gastro-esophageal reflux disease without esophagitis: Secondary | ICD-10-CM

## 2011-02-22 LAB — CBC WITH DIFFERENTIAL/PLATELET
Basophils Absolute: 0 10*3/uL (ref 0.0–0.1)
Eosinophils Relative: 1.8 % (ref 0.0–5.0)
Monocytes Relative: 9.4 % (ref 3.0–12.0)
Neutrophils Relative %: 55.4 % (ref 43.0–77.0)
Platelets: 256 10*3/uL (ref 150.0–400.0)
WBC: 5.8 10*3/uL (ref 4.5–10.5)

## 2011-02-22 LAB — BASIC METABOLIC PANEL
BUN: 12 mg/dL (ref 6–23)
Calcium: 9.2 mg/dL (ref 8.4–10.5)
Chloride: 105 mEq/L (ref 96–112)
Creatinine, Ser: 0.8 mg/dL (ref 0.4–1.2)

## 2011-02-22 MED ORDER — CEFPODOXIME PROXETIL 50 MG/5ML PO SUSR: 100.0000 mg | Freq: Two times a day (BID) | ORAL | Status: AC

## 2011-02-22 NOTE — Progress Notes (Signed)
Subjective:    Patient ID: [name], female    DOB: 1929-01-27, 77 y.o.   MRN: [id number]  HPI  Ate pimento cheese and has a flair of reflux with nausea and vomiting. She may have aspirated and developed a cough but she took mucinex and it has  Partially cleared. No fever or chills her cough has improved. She has a history of severe reflux because she has a small bowel pull-through date of esophageal cancer esophagectomy She is on Reglan which has been the only effective medication for controlling her reflux she does have a mild asked her to her medical handshake from the Reglan  Review of Systems  Constitutional: Negative for activity change, appetite change and fatigue.  HENT: Negative for ear pain, congestion, neck pain, postnasal drip and sinus pressure.   Eyes: Negative for redness and visual disturbance.  Respiratory: Negative for cough, shortness of breath and wheezing.   Gastrointestinal: Negative for abdominal pain and abdominal distention.  Genitourinary: Negative for dysuria, frequency and menstrual problem.  Musculoskeletal: Negative for myalgias, joint swelling and arthralgias.  Skin: Negative for rash and wound.  Neurological: Negative for dizziness, weakness and headaches.  Hematological: Negative for adenopathy. Does not bruise/bleed easily.  Psychiatric/Behavioral: Negative for sleep disturbance and decreased concentration.   Past Medical History  Diagnosis Date  . GERD (gastroesophageal reflux disease)   . Osteoporosis   . Postmenopausal HRT (hormone replacement therapy)   . Esophageal cancer   . Anemia   . Hypertension   . Allergy    Past Surgical History  Procedure Date  . Abdominal hysterectomy   . Left oophorectomy   . Esophageal removal of cancer,gastric ppull-thru 1993   . Cholecystectomy   . Rt arm fx   . Oophorectomy     reports that she has never smoked. She does not have any smokeless tobacco history on file. She reports that she does  not drink alcohol or use illicit drugs. family history includes Cancer in her mother; Coronary artery disease in an unspecified family member; and Heart disease in her father. Allergies  Allergen Reactions  . Erythromycin Ethylsuccinate     REACTION: unspecified  . Prednisone     REACTION: unspecified       Objective:   Physical Exam  Constitutional: She is oriented to person, place, and time. She appears well-developed and well-nourished. No distress.  HENT:  Head: Normocephalic and atraumatic.  Right Ear: External ear normal.  Left Ear: External ear normal.  Nose: Nose normal.  Mouth/Throat: Oropharynx is clear and moist.  Eyes: Conjunctivae and EOM are normal. Pupils are equal, round, and reactive to light.  Neck: Normal range of motion. Neck supple. No JVD present. No tracheal deviation present. No thyromegaly present.  Cardiovascular: Normal rate, regular rhythm, normal heart sounds and intact distal pulses.   No murmur heard. Pulmonary/Chest: Effort normal. She has wheezes. She has rales. She exhibits no tenderness.       Right upper lobe  Abdominal: Soft. Bowel sounds are normal.  Musculoskeletal: Normal range of motion. She exhibits no edema and no tenderness.  Lymphadenopathy:    She has no cervical adenopathy.  Neurological: She is alert and oriented to person, place, and time. She has normal reflexes. No cranial nerve deficit.  Skin: Skin is warm and dry. She is not diaphoretic.  Psychiatric: She has a normal mood and affect. Her behavior is normal.          Assessment & Plan:  Extrapyramidal side  effects from Reglan hand tremor on right side patient is aware of this and believes that the benefits are worth the risks in terms of the continued use of the Reglan.   History of severe reflux with aspiration recently had an episode where she was intolerant of the food had some nausea and vomiting developed a cough it has resolved but on physical examination today she  does have some rails in her right upper lobe

## 2011-02-22 NOTE — Patient Instructions (Signed)
You will be given a shingles vaccination today

## 2011-02-22 NOTE — Progress Notes (Signed)
Addended by: Willy Eddy on: 02/22/2011 12:22 PM   Modules accepted: Orders

## 2011-02-23 ENCOUNTER — Telehealth: Payer: Self-pay | Admitting: *Deleted

## 2011-02-23 NOTE — Telephone Encounter (Signed)
Per dr Lovell Sheehan- omnicef  300 bid for 5 days liquid given

## 2011-02-23 NOTE — Telephone Encounter (Signed)
vantin is unavaiable- can you please change antibioitc?

## 2011-02-23 NOTE — Telephone Encounter (Signed)
vantin is not available- can you change to another anitibioitc

## 2011-03-17 ENCOUNTER — Other Ambulatory Visit: Payer: Self-pay | Admitting: Gastroenterology

## 2011-03-17 NOTE — Telephone Encounter (Signed)
[id number] [facility]  [id number] case ID Prevacid 30 mg daily  Needs prior auth 06/30/2010-01/13/2011 Needs new prior auth info sent.

## 2011-03-17 NOTE — Telephone Encounter (Signed)
Pt aware sent prior auth.

## 2011-05-27 ENCOUNTER — Encounter: Payer: Self-pay | Admitting: Internal Medicine

## 2011-05-27 ENCOUNTER — Ambulatory Visit (INDEPENDENT_AMBULATORY_CARE_PROVIDER_SITE_OTHER): Payer: Medicare Other | Admitting: Internal Medicine

## 2011-05-27 VITALS — BP 122/70 | HR 76 | Temp 98.3°F | Resp 16 | Ht 63.0 in | Wt 123.0 lb

## 2011-05-27 DIAGNOSIS — J45901 Unspecified asthma with (acute) exacerbation: Secondary | ICD-10-CM

## 2011-05-27 DIAGNOSIS — I10 Essential (primary) hypertension: Secondary | ICD-10-CM

## 2011-05-27 DIAGNOSIS — J209 Acute bronchitis, unspecified: Secondary | ICD-10-CM

## 2011-05-27 DIAGNOSIS — Z23 Encounter for immunization: Secondary | ICD-10-CM

## 2011-05-27 MED ORDER — MOXIFLOXACIN HCL 400 MG PO TABS: 400.0000 mg | ORAL_TABLET | Freq: Every day | ORAL | Status: AC

## 2011-05-27 MED ORDER — FERROUS FUMARATE 325 (106 FE) MG PO TABS: 325.0000 mg | ORAL_TABLET | Freq: Two times a day (BID) | ORAL | Status: DC

## 2011-05-27 MED ORDER — ZOLPIDEM TARTRATE 5 MG PO TABS: 5.0000 mg | ORAL_TABLET | Freq: Every evening | ORAL | Status: DC | PRN

## 2011-05-27 MED ORDER — BISOPROLOL-HYDROCHLOROTHIAZIDE 2.5-6.25 MG PO TABS: 1.0000 | ORAL_TABLET | Freq: Every day | ORAL | Status: DC

## 2011-05-27 NOTE — Progress Notes (Signed)
Subjective:    Patient ID: [name], female    DOB: [birth year], 77 y.o.   MRN: [id number]  HPI Pt with increased nausea and GERD post esophageal surgery. Hacking cough with yellow sputum. She has a history of aspiration pneumonia. If she has any signs and physical examination of congestion and antibiotic would be recommended due to this history.  Blood pressure appears to be stable the rest of vital signs are stable she is afebrile has a history of iron deficiency and B12 deficiency anemia. She has stable T12 CBC with differential in July   Review of Systems  Constitutional: Negative for activity change, appetite change and fatigue.  HENT: Negative for ear pain, congestion, neck pain, postnasal drip and sinus pressure.   Eyes: Negative for redness and visual disturbance.  Respiratory: Positive for cough, choking and shortness of breath. Negative for wheezing.   Gastrointestinal: Negative for abdominal pain and abdominal distention.  Genitourinary: Negative for dysuria, frequency and menstrual problem.  Musculoskeletal: Negative for myalgias, joint swelling and arthralgias.  Skin: Negative for rash and wound.  Neurological: Negative for dizziness, weakness and headaches.  Hematological: Negative for adenopathy. Does not bruise/bleed easily.  Psychiatric/Behavioral: Negative for sleep disturbance and decreased concentration.   Past Medical History  Diagnosis Date  . GERD (gastroesophageal reflux disease)   . Osteoporosis   . Postmenopausal HRT (hormone replacement therapy)   . Esophageal cancer   . Anemia   . Hypertension   . Allergy    Past Surgical History  Procedure Date  . Abdominal hysterectomy   . Left oophorectomy   . Esophageal removal of cancer,gastric ppull-thru 1993   . Cholecystectomy   . Rt arm fx   . Oophorectomy     reports that she has never smoked. She does not have any smokeless tobacco history on file. She reports that she does not drink alcohol or  use illicit drugs. family history includes Cancer in her mother; Coronary artery disease in an unspecified family member; and Heart disease in her father. Allergies  Allergen Reactions  . Erythromycin Ethylsuccinate     REACTION: unspecified  . Prednisone     REACTION: unspecified       Objective:   Physical Exam  Nursing note and vitals reviewed. Constitutional: She is oriented to person, place, and time. She appears well-developed and well-nourished. No distress.  HENT:  Head: Normocephalic and atraumatic.  Right Ear: External ear normal.  Left Ear: External ear normal.  Nose: Nose normal.  Mouth/Throat: Oropharynx is clear and moist.  Eyes: Conjunctivae and EOM are normal. Pupils are equal, round, and reactive to light.  Neck: Normal range of motion. Neck supple. No JVD present. No tracheal deviation present. No thyromegaly present.  Cardiovascular: Normal rate, regular rhythm, normal heart sounds and intact distal pulses.   No murmur heard. Pulmonary/Chest: Effort normal. She has wheezes. She has rales. She exhibits no tenderness.  Abdominal: Soft. Bowel sounds are normal.  Musculoskeletal: Normal range of motion. She exhibits no edema and no tenderness.  Lymphadenopathy:    She has no cervical adenopathy.  Neurological: She is alert and oriented to person, place, and time. She has normal reflexes. No cranial nerve deficit.  Skin: Skin is warm and dry. She is not diaphoretic.  Psychiatric: She has a normal mood and affect. Her behavior is normal.          Assessment & Plan:  Use Robitussin liquid along with the antibiotic to place you on We will use  Avelox 400 milligram daily for 7 days.  Blood pressure stable anemia is stable GERD symptoms are worsened secondary to nausea probably from increased secretions.

## 2011-05-27 NOTE — Patient Instructions (Signed)
The patient is instructed to continue all medications as prescribed. Schedule followup with check out clerk upon leaving the clinic  

## 2011-07-04 ENCOUNTER — Encounter: Payer: Self-pay | Admitting: Gastroenterology

## 2011-07-04 ENCOUNTER — Ambulatory Visit (INDEPENDENT_AMBULATORY_CARE_PROVIDER_SITE_OTHER): Payer: Medicare Other | Admitting: Gastroenterology

## 2011-07-04 VITALS — BP 92/66 | HR 64 | Ht 63.0 in | Wt 124.8 lb

## 2011-07-04 DIAGNOSIS — K219 Gastro-esophageal reflux disease without esophagitis: Secondary | ICD-10-CM

## 2011-07-04 MED ORDER — LANSOPRAZOLE 30 MG PO TBDP: 60.0000 mg | ORAL_TABLET | Freq: Two times a day (BID) | ORAL | Status: DC

## 2011-07-04 NOTE — Progress Notes (Signed)
Review of gastrointestinal problems:  1. Status post esophagectomy and gastric pull up for esophageal cancer, 1993.  2. Chronic GERD. Dyspepsia, likely much of it from #1. EGD January 2009 showed high anastomosis (18 cm from incisors), ring-like narrowing at the anastomosis, but non-adenomatous appearing. No esophagitis. No Barrett's changes.   Needs high dose PPI twice daily to control her symptoms 3. adenomatous colon polyps, removed October 2005. November, 2010 colonoscopy found small adenomatous polyp. Was recommended against routine surveillance (would be 77 years old at time of next colonoscopy)  HPI: This is a    She was sick back in October, with URI symptoms.  Was put on avelox and overall feels better except for a lingering cough.  She is doing well from GI standpoint, except a bit more belching than usual.  reglan 5mg  twice daily (has been on it for a long time). Will be out of this in 7 days. Dr. Lovell Sheehan does this. Also on phenergan periodically, has plenty of refills for this Takes prevacid 30 solutab, two of them twice daily.   Past Medical History  Diagnosis Date  . GERD (gastroesophageal reflux disease)   . Osteoporosis   . Postmenopausal HRT (hormone replacement therapy)   . Esophageal cancer   . Anemia   . Hypertension   . Allergy     Past Surgical History  Procedure Date  . Abdominal hysterectomy   . Left oophorectomy   . Esophageal removal of cancer,gastric ppull-thru 1993   . Cholecystectomy   . Rt arm fx   . Oophorectomy     Current Outpatient Prescriptions  Medication Sig Dispense Refill  . albuterol (PROVENTIL HFA;VENTOLIN HFA) 108 (90 BASE) MCG/ACT inhaler Inhale 2 puffs into the lungs every 6 (six) hours as needed.        . bisoprolol-hydrochlorothiazide (ZIAC) 2.5-6.25 MG per tablet Take 1 tablet by mouth daily.  90 tablet  3  . Calcium-Vitamin D-Vitamin K (VIACTIV) 500-100-40 MG-UNT-MCG CHEW Chew by mouth 3 (three) times daily.        .  ergocalciferol (VITAMIN D2) 50000 UNITS capsule Take 50,000 Units by mouth once a week.        . ferrous fumarate (HEMOCYTE) 325 (106 FE) MG TABS Take 3 tablets (318 mg of iron total) by mouth 2 (two) times daily.  180 tablet  3  . lansoprazole (PREVACID SOLUTAB) 30 MG disintegrating tablet Take 30 mg by mouth 2 (two) times daily as needed.        Marland Kitchen levocetirizine (XYZAL) 5 MG tablet Take 5 mg by mouth every evening.        . metoCLOPramide (REGLAN) 5 MG tablet Take 5 mg by mouth 2 (two) times daily.        . promethazine (PHENERGAN) 25 MG suppository Place 25 mg rectally every 6 (six) hours as needed.        . sucralfate (CARAFATE) 1 GM/10ML suspension Take 10 mLs (1 g total) by mouth 3 (three) times daily.  420 mL  3  . zolpidem (AMBIEN) 5 MG tablet Take 1 tablet (5 mg total) by mouth at bedtime as needed for sleep.  90 tablet  1    Allergies as of 07/04/2011 - Review Complete 07/04/2011  Allergen Reaction Noted  . Erythromycin ethylsuccinate  09/26/2006  . Prednisone  09/26/2006    Family History  Problem Relation Age of Onset  . Cancer Mother     cervical  . Heart disease Father   . Coronary artery disease  History   Social History  . Marital Status: Single    Spouse Name: N/A    Number of Children: 1  . Years of Education: N/A   Occupational History  . Retired    Social History Main Topics  . Smoking status: Never Smoker   . Smokeless tobacco: Never Used  . Alcohol Use: No  . Drug Use: No  . Sexually Active: Yes   Other Topics Concern  . Not on file   Social History Narrative   DAILY CAFFEINE USE      Physical Exam: BP 92/66  Pulse 64  Ht 5\' 3"  (1.6 m)  Wt 124 lb 12.8 oz (56.609 kg)  BMI 22.11 kg/m2 Constitutional: generally well-appearing Psychiatric: alert and oriented x3 Abdomen: soft, nontender, nondistended, no obvious ascites, no peritoneal signs, normal bowel sounds     Assessment and plan: 77 y.o. female with chronic GERD status post  esophagectomy  Clinically doing well. I refilled her Prevacid Solutabs. I let her know I am happy to refill this again in a year from now without her coming to see me. In 2 years however I would like to see her again in the office. She has no alarm symptoms currently

## 2011-07-04 NOTE — Patient Instructions (Signed)
Prevacid reordered through [facility] (30mg  pills, take 2 pills twice daily) We will refill at 1 year, but please return to see Dr. Christella Hartigan in 2 years.

## 2011-08-03 ENCOUNTER — Encounter: Payer: Self-pay | Admitting: Family Medicine

## 2011-08-03 ENCOUNTER — Ambulatory Visit (INDEPENDENT_AMBULATORY_CARE_PROVIDER_SITE_OTHER): Payer: Medicare Other | Admitting: Family Medicine

## 2011-08-03 VITALS — BP 120/70 | Temp 98.4°F | Wt 126.0 lb

## 2011-08-03 DIAGNOSIS — R05 Cough: Secondary | ICD-10-CM

## 2011-08-03 MED ORDER — LEVOFLOXACIN 500 MG PO TABS: 500.0000 mg | ORAL_TABLET | Freq: Every day | ORAL | Status: AC

## 2011-08-03 NOTE — Patient Instructions (Signed)
Follow up immediately for any fever or increased shortness of breath. Also, follow up for any recurrent vomiting.

## 2011-08-03 NOTE — Progress Notes (Signed)
  Subjective:    Patient ID: [name], female    DOB: 04-27-1929, 78 y.o.   MRN: [id number]  HPI  Acute visit. Patient seen with chest congestion and one episode of vomiting minimal blood-tinged emesis yesterday. She has history of esophageal cancer back in 1993. History of reconstructed esophagus and she has chronic reflux. Followed closely by gastroenterology. She relates onset of sore throat and fever up to 100 last Thursday. Cough productive of yellow sputum past couple days. History of aspiration pneumonia previously. Denies dyspnea. Nonsmoker   Review of Systems  Constitutional: Positive for fever. Negative for chills.  HENT: Negative for sore throat, trouble swallowing and postnasal drip.   Respiratory: Positive for cough. Negative for shortness of breath and wheezing.   Cardiovascular: Negative for chest pain, palpitations and leg swelling.  Gastrointestinal: Negative for abdominal pain.  Neurological: Negative for headaches.       Objective:   Physical Exam  Constitutional: She appears well-developed and well-nourished. No distress.  HENT:  Right Ear: External ear normal.  Left Ear: External ear normal.  Mouth/Throat: Oropharynx is clear and moist.  Neck: Neck supple.  Cardiovascular: Normal rate and regular rhythm.   Pulmonary/Chest: Effort normal and breath sounds normal. No respiratory distress. She has no wheezes. She has no rales.  Lymphadenopathy:    She has no cervical adenopathy.          Assessment & Plan:  Cough and persistent low-grade fever in a patient at increased risk for aspiration pneumonia. No rales noted at this time. Currently afebrile. Start Levaquin 500 mg 1 daily 10 days. Follow up promptly for increasing fever, shortness of breath, or for any recurrent vomiting.  No hypoxia with O2 sats 97% room air at rest

## 2011-08-29 ENCOUNTER — Encounter: Payer: Self-pay | Admitting: Internal Medicine

## 2011-08-29 ENCOUNTER — Ambulatory Visit (INDEPENDENT_AMBULATORY_CARE_PROVIDER_SITE_OTHER): Payer: Medicare Other | Admitting: Internal Medicine

## 2011-08-29 VITALS — BP 130/72 | HR 76 | Temp 98.3°F | Resp 16 | Ht 63.0 in | Wt 122.0 lb

## 2011-08-29 DIAGNOSIS — J690 Pneumonitis due to inhalation of food and vomit: Secondary | ICD-10-CM

## 2011-08-29 MED ORDER — CEFDINIR 250 MG/5ML PO SUSR: 250.0000 mg | Freq: Two times a day (BID) | ORAL | Status: AC

## 2011-08-29 MED ORDER — FERROUS FUMARATE 325 (106 FE) MG PO TABS: 325.0000 mg | ORAL_TABLET | Freq: Two times a day (BID) | ORAL | Status: DC

## 2011-08-29 MED ORDER — ERGOCALCIFEROL 1.25 MG (50000 UT) PO CAPS: 50000.0000 [IU] | ORAL_CAPSULE | ORAL | Status: DC

## 2011-08-29 MED ORDER — METOCLOPRAMIDE HCL 5 MG PO TABS: 5.0000 mg | ORAL_TABLET | Freq: Two times a day (BID) | ORAL | Status: DC

## 2011-08-29 NOTE — Progress Notes (Signed)
Subjective:    Patient ID: [name], female    DOB: 21-Jul-1929, 78 y.o.   MRN: [id number]  HPI  This is an 78 year old white female with a history of esophageal cancer and with a pull-through reconstruction of her soft tissues with small bowel. She has a history of severe reflux esophagitis nausea and vomiting and recently had a significant increase in her nausea and vomiting with bile-colored and tasting emesis. Her only medication changes recently was the use of Mucinex fast max liquid for upper respiratory tract infection. Today she presents with a wet cough  Review of Systems  Constitutional: Positive for fatigue. Negative for activity change and appetite change.  HENT: Positive for postnasal drip. Negative for ear pain, congestion, neck pain and sinus pressure.   Eyes: Negative for redness and visual disturbance.  Respiratory: Negative for cough, shortness of breath and wheezing.   Gastrointestinal: Positive for nausea, vomiting and abdominal distention. Negative for abdominal pain.  Genitourinary: Negative for dysuria, frequency and menstrual problem.  Musculoskeletal: Negative for myalgias, joint swelling and arthralgias.  Skin: Negative for rash and wound.  Neurological: Negative for dizziness, weakness and headaches.  Hematological: Negative for adenopathy. Does not bruise/bleed easily.  Psychiatric/Behavioral: Negative for sleep disturbance and decreased concentration.   Past Medical History  Diagnosis Date  . GERD (gastroesophageal reflux disease)   . Osteoporosis   . Postmenopausal HRT (hormone replacement therapy)   . Esophageal cancer   . Anemia   . Hypertension   . Allergy     History   Social History  . Marital Status: Single    Spouse Name: N/A    Number of Children: 1  . Years of Education: N/A   Occupational History  . Retired    Social History Main Topics  . Smoking status: Never Smoker   . Smokeless tobacco: Never Used  . Alcohol Use: No    . Drug Use: No  . Sexually Active: Yes   Other Topics Concern  . Not on file   Social History Narrative   DAILY CAFFEINE USE    Past Surgical History  Procedure Date  . Abdominal hysterectomy   . Left oophorectomy   . Esophageal removal of cancer,gastric ppull-thru 1993   . Cholecystectomy   . Rt arm fx   . Oophorectomy     Family History  Problem Relation Age of Onset  . Cancer Mother     cervical  . Heart disease Father   . Coronary artery disease      Allergies  Allergen Reactions  . Erythromycin Ethylsuccinate     REACTION: unspecified  . Prednisone     REACTION: unspecified    Current Outpatient Prescriptions on File Prior to Visit  Medication Sig Dispense Refill  . albuterol (PROVENTIL HFA;VENTOLIN HFA) 108 (90 BASE) MCG/ACT inhaler Inhale 2 puffs into the lungs every 6 (six) hours as needed.        . bisoprolol-hydrochlorothiazide (ZIAC) 2.5-6.25 MG per tablet Take 1 tablet by mouth daily.  90 tablet  3  . Calcium-Vitamin D-Vitamin K (VIACTIV) 500-100-40 MG-UNT-MCG CHEW Chew by mouth 3 (three) times daily.        . lansoprazole (PREVACID SOLUTAB) 30 MG disintegrating tablet Take 2 tablets (60 mg total) by mouth 2 (two) times daily.  360 tablet  3  . levocetirizine (XYZAL) 5 MG tablet Take 5 mg by mouth every evening.        . promethazine (PHENERGAN) 25 MG suppository Place 25 mg rectally  every 6 (six) hours as needed.        . sucralfate (CARAFATE) 1 GM/10ML suspension Take 10 mLs (1 g total) by mouth 3 (three) times daily.  420 mL  3  . zolpidem (AMBIEN) 5 MG tablet Take 1 tablet (5 mg total) by mouth at bedtime as needed for sleep.  90 tablet  1    BP 130/72  Pulse 76  Temp 98.3 F (36.8 C)  Resp 16  Ht 5\' 3"  (1.6 m)  Wt 122 lb (55.339 kg)  BMI 21.61 kg/m2       Objective:   Physical Exam  Nursing note and vitals reviewed. Constitutional: She is oriented to person, place, and time.  HENT:  Head: Normocephalic and atraumatic.  Eyes:  Conjunctivae are normal. Pupils are equal, round, and reactive to light.  Cardiovascular: Normal rate and regular rhythm.   Pulmonary/Chest: She has no wheezes. She has no rales.       In the left posterior lung field  Abdominal: She exhibits distension. There is tenderness.  Musculoskeletal: Normal range of motion.  Neurological: She is alert and oriented to person, place, and time.  Skin: Skin is warm and dry.          Assessment & Plan:  Probable aspiration pneumonitis risk of aspiration pneumonia We'll treat with antibiotics to cover anaerobic bacteria due to the vomiting As well as probably avoid the use of Mucinex liquid in the setting of nausea She is on a promotility drug Reglan 5 mg.  Phenergan to control nausea Liquid diet for 24 hours while initiating antibiotic therapy may use applesauce with a liquid diet

## 2011-08-29 NOTE — Patient Instructions (Signed)
Clear liquid diet and applesauce for the next 24 hours

## 2011-08-31 ENCOUNTER — Other Ambulatory Visit: Payer: Self-pay | Admitting: *Deleted

## 2011-08-31 ENCOUNTER — Telehealth: Payer: Self-pay | Admitting: Internal Medicine

## 2011-08-31 MED ORDER — HEMOCYTE PLUS 106-1 MG PO CAPS: 106.0000 mg | ORAL_CAPSULE | Freq: Two times a day (BID) | ORAL | Status: DC

## 2011-08-31 NOTE — Telephone Encounter (Signed)
Take 1 bid-requests cap-[facility]

## 2011-08-31 NOTE — Telephone Encounter (Signed)
Please advise of rx Hemocyle plus. Tid tablets was sent to mail order. Patient stated that she used to be on capsules bid. Please advise patient. Thanks.

## 2011-09-06 ENCOUNTER — Telehealth: Payer: Self-pay | Admitting: *Deleted

## 2011-09-06 MED ORDER — LEVOFLOXACIN 500 MG PO TABS: 500.0000 mg | ORAL_TABLET | Freq: Every day | ORAL | Status: DC

## 2011-09-06 NOTE — Telephone Encounter (Signed)
Notified pt. 

## 2011-09-06 NOTE — Telephone Encounter (Signed)
Per dr Esmond Camper- start levaquin 750 qd for 10 days

## 2011-09-06 NOTE — Telephone Encounter (Signed)
Sent Levaquin to [facility]

## 2011-09-06 NOTE — Telephone Encounter (Signed)
Pt is still running fevers ....Marland KitchenMarland KitchenToday it is 100.9, and she is coughing up colored mucus, and feels worse than she felt when she saw you last.   Is eating dry toast and apple juice.

## 2011-10-03 ENCOUNTER — Encounter: Payer: Self-pay | Admitting: Internal Medicine

## 2011-10-17 ENCOUNTER — Other Ambulatory Visit (INDEPENDENT_AMBULATORY_CARE_PROVIDER_SITE_OTHER): Payer: Medicare Other

## 2011-10-17 ENCOUNTER — Ambulatory Visit (INDEPENDENT_AMBULATORY_CARE_PROVIDER_SITE_OTHER): Payer: Medicare Other | Admitting: Physician Assistant

## 2011-10-17 ENCOUNTER — Encounter: Payer: Self-pay | Admitting: Physician Assistant

## 2011-10-17 ENCOUNTER — Telehealth: Payer: Self-pay | Admitting: *Deleted

## 2011-10-17 ENCOUNTER — Ambulatory Visit (INDEPENDENT_AMBULATORY_CARE_PROVIDER_SITE_OTHER)
Admission: RE | Admit: 2011-10-17 | Discharge: 2011-10-17 | Disposition: A | Discharge status: Home / Self Care | Payer: Medicare Other | Source: Ambulatory Visit | Attending: Physician Assistant | Admitting: Physician Assistant

## 2011-10-17 ENCOUNTER — Telehealth: Payer: Self-pay | Admitting: Gastroenterology

## 2011-10-17 VITALS — BP 116/64 | HR 68 | Ht 63.0 in | Wt 126.0 lb

## 2011-10-17 DIAGNOSIS — K920 Hematemesis: Secondary | ICD-10-CM

## 2011-10-17 DIAGNOSIS — J690 Pneumonitis due to inhalation of food and vomit: Secondary | ICD-10-CM

## 2011-10-17 LAB — CBC WITH DIFFERENTIAL/PLATELET
Basophils Relative: 0.2 % (ref 0.0–3.0)
Eosinophils Absolute: 0.1 10*3/uL (ref 0.0–0.7)
Eosinophils Relative: 1 % (ref 0.0–5.0)
Hemoglobin: 14.2 g/dL (ref 12.0–15.0)
Lymphocytes Relative: 13.1 % (ref 12.0–46.0)
MCHC: 32.4 g/dL (ref 30.0–36.0)
MCV: 88.3 fl (ref 78.0–100.0)
Neutro Abs: 10.1 10*3/uL — ABNORMAL HIGH (ref 1.4–7.7)
RBC: 4.95 Mil/uL (ref 3.87–5.11)

## 2011-10-17 MED ORDER — MOXIFLOXACIN HCL 400 MG PO TABS: 400.0000 mg | ORAL_TABLET | Freq: Every day | ORAL | Status: AC

## 2011-10-17 NOTE — Progress Notes (Signed)
Reviewed and agree with management. Muna Demers T. Abbegayle Denault MD FACG 

## 2011-10-17 NOTE — Patient Instructions (Signed)
Please go to the basement level to have your labs drawn.   Go also to the Radiology department, basement level.  Full liquid diet for now. Continue the Prevacid daily.

## 2011-10-17 NOTE — Telephone Encounter (Signed)
Pt has had 1 month of nausea and abd pain with vomiting of dark blood mixed with mucous.  Pt has been taking Avelox, phenergan and prevacid solutab.  Pt has been added to Amy Esterwood's schedule for today at 1:30 pm.

## 2011-10-17 NOTE — Progress Notes (Signed)
Subjective:    Patient ID: [name], female    DOB: Oct 18, 1928, 78 y.o.   MRN: [id number]  HPI [name] is a very nice 78 year old white female, known to Dr. Christella Hartigan who underwent an esophagectomy and gastric pullup for esophageal cancer in 1993. She has had problems with chronic GERD, she is also felt to have recurrent aspiration pneumonias. She has history of colon polyps with last colonoscopy done in 2010 with one small adenomatous polyp removed at that time.  Her last upper endoscopy was done in January of 2009 per Dr. Christella Hartigan showing a high anastomosis at about 18 cm and a slight ringlike narrowing at the anastomosis. This was not dilated. She had barium swallow done in 2010 which showed a patulous neo-esophagus, with contrast poolling especially in the supine position  Patient relates that she has been sick off and on since January of 2013 and seems to be having cycles which occur every week. She says she starts with a decrease in her appetite and onset of nausea and a feeling of swelling and fullness in her chest to light there is fluid building up. Eventually this gets painful and she starts having nausea and vomiting, over the past month she says whenever she vomits she brings up some bright red blood. She says she will empty out her system and then is able to eat better for a couple of days and whole cycle starts over again. Also when she has episodes of nausea and vomiting she will develop fever weakness and some cough and has been treated repeatedly for aspiration pneumonia. Her last episode that was about 8 days ago and she is just finishing a course of Avelox. She says she thinks she had some low-grade fever this morning in the 99 range has been coughing intermittently with some yellow sputum. Patient comes in today because she says she woke up about 4:30 AM this morning felt like she was coughing or strangling and then threw up a chunk  of dark red blood.    Review of Systems    Constitutional: Positive for fever and appetite change.  HENT: Positive for trouble swallowing.   Respiratory: Positive for cough and chest tightness.   Cardiovascular: Negative.   Gastrointestinal: Positive for vomiting.  Genitourinary: Negative.   Musculoskeletal: Negative.   Skin: Negative.   Neurological: Negative.   Hematological: Negative.   Psychiatric/Behavioral: Negative.    Outpatient Prescriptions Prior to Visit  Medication Sig Dispense Refill  . albuterol (PROVENTIL HFA;VENTOLIN HFA) 108 (90 BASE) MCG/ACT inhaler Inhale 2 puffs into the lungs every 6 (six) hours as needed.        . B Complex-C-Min-Fe-FA (HEMOCYTE PLUS) 106-1 MG CAPS Take 106 mg by mouth 2 (two) times daily.  180 each  3  . bisoprolol-hydrochlorothiazide (ZIAC) 2.5-6.25 MG per tablet Take 1 tablet by mouth daily.  90 tablet  3  . Calcium-Vitamin D-Vitamin K (VIACTIV) 500-100-40 MG-UNT-MCG CHEW Chew by mouth 3 (three) times daily.        . ergocalciferol (VITAMIN D2) 50000 UNITS capsule Take 1 capsule (50,000 Units total) by mouth once a week.  12 capsule  3  . lansoprazole (PREVACID SOLUTAB) 30 MG disintegrating tablet Take 2 tablets (60 mg total) by mouth 2 (two) times daily.  360 tablet  3  . levocetirizine (XYZAL) 5 MG tablet Take 5 mg by mouth every evening.        . metoCLOPramide (REGLAN) 5 MG tablet Take 1 tablet (5 mg  total) by mouth 2 (two) times daily.  180 tablet  3  . promethazine (PHENERGAN) 25 MG suppository Place 25 mg rectally every 6 (six) hours as needed.        . zolpidem (AMBIEN) 5 MG tablet Take 1 tablet (5 mg total) by mouth at bedtime as needed for sleep.  90 tablet  1   Allergies  Allergen Reactions  . Erythromycin Ethylsuccinate     REACTION: unspecified  . Prednisone     REACTION: unspecified   Patient Active Problem List  Diagnoses  . B12 DEFICIENCY  . ANEMIA-IRON DEFICIENCY  . HYPERTENSION  . ALLERGIC RHINITIS  . ASPIRATION PNEUMONIA, RIGHT LOWER LOBE  . GERD  .  OSTEOPOROSIS  . COUGH  . HX OF ESOPHAGEAL CANCER  . PERSONAL HISTORY OF COLONIC POLYPS  . INSOMNIA  . Asthma with bronchitis       Objective:   Physical Exam well-developed elderly white female in no acute distress blood pressure 116/64 pulse 68 weight 126 temperature 98.5. HEENT; nontraumatic, normocephalic, EOMI,PERRLA ,sclera anicteric,Neck; Kyphotic, no JVD, Cardiovascula;r regular rate and rhythm with S1-S2 no murmur gallop, Pulmonary; clear bilaterally, Abdomen; soft, nontender, nondistended ,no palpable mass or hepatosplenomegaly bowel sounds are active  Rectal; not done,;Ext;no clubbing cyanosis or edema, Psych; mood and affect normal and appropriate.        Assessment & Plan:  #44 78 year old female with history of esophageal cancer 1993 status post esophagectomy and gastric pullup . Patient now with cyclic episodes of nausea, fullness in the chest, chest discomfort- followed by vomiting and intermittent hematemesis over the past 2 months. Patient also has had recurrent episodes of fever ,weakness and cough following her episodic vomiting which is most likely aspiration related. Will need to rule out esophagitis, anastomotic stricture, recurrent malignancy.  Previous barium swallow has shown a very patulous neo esophagus  Plan; chest x-ray PA and lateral today Extend  current course of Avelox for 3 more days Check CBC Scheduled for upper endoscopy with possible esophageal dilation with Dr. Christella Hartigan this week at the hospital with propofol sedation. Procedure was discussed in detail with the patient and she is agreeable to proceed. Continue Prevacid solute tabs 30 mg twice daily , Reglan 10 mg twice daily, and also currently using Carafate 1 g 3 times daily

## 2011-10-17 NOTE — Telephone Encounter (Signed)
Dr Lovell Sheehan does want pt to get in touch with dr Christella Hartigan

## 2011-10-17 NOTE — Telephone Encounter (Signed)
Pt is calling stating she has been vomiting off and on x 3 weeks, and also has blood when she vomits most times.  She has called Dr. Christella Hartigan, and wanted Dr. Lovell Sheehan to know, and to make sure this is what he suggests.

## 2011-10-19 ENCOUNTER — Ambulatory Visit (HOSPITAL_COMMUNITY)
Admission: RE | Admit: 2011-10-19 | Discharge: 2011-10-19 | Disposition: A | Discharge status: Home / Self Care | Payer: Medicare Other | Source: Ambulatory Visit | Attending: Gastroenterology | Admitting: Gastroenterology

## 2011-10-19 ENCOUNTER — Encounter (HOSPITAL_COMMUNITY): Payer: Self-pay | Admitting: *Deleted

## 2011-10-19 ENCOUNTER — Encounter (HOSPITAL_COMMUNITY)
Admission: RE | Disposition: A | Discharge status: Home / Self Care | Payer: Self-pay | Source: Ambulatory Visit | Attending: Gastroenterology

## 2011-10-19 ENCOUNTER — Encounter (HOSPITAL_COMMUNITY): Payer: Self-pay | Admitting: Anesthesiology

## 2011-10-19 ENCOUNTER — Ambulatory Visit (HOSPITAL_COMMUNITY): Payer: Medicare Other | Admitting: Anesthesiology

## 2011-10-19 DIAGNOSIS — K222 Esophageal obstruction: Secondary | ICD-10-CM

## 2011-10-19 DIAGNOSIS — R1310 Dysphagia, unspecified: Secondary | ICD-10-CM

## 2011-10-19 DIAGNOSIS — K2990 Gastroduodenitis, unspecified, without bleeding: Secondary | ICD-10-CM

## 2011-10-19 DIAGNOSIS — R1110 Vomiting, unspecified: Secondary | ICD-10-CM

## 2011-10-19 DIAGNOSIS — K319 Disease of stomach and duodenum, unspecified: Secondary | ICD-10-CM | POA: Insufficient documentation

## 2011-10-19 DIAGNOSIS — R933 Abnormal findings on diagnostic imaging of other parts of digestive tract: Secondary | ICD-10-CM

## 2011-10-19 DIAGNOSIS — K2970 Gastritis, unspecified, without bleeding: Secondary | ICD-10-CM | POA: Insufficient documentation

## 2011-10-19 DIAGNOSIS — J45909 Unspecified asthma, uncomplicated: Secondary | ICD-10-CM | POA: Insufficient documentation

## 2011-10-19 DIAGNOSIS — Z8701 Personal history of pneumonia (recurrent): Secondary | ICD-10-CM | POA: Insufficient documentation

## 2011-10-19 DIAGNOSIS — K219 Gastro-esophageal reflux disease without esophagitis: Secondary | ICD-10-CM | POA: Insufficient documentation

## 2011-10-19 DIAGNOSIS — I10 Essential (primary) hypertension: Secondary | ICD-10-CM | POA: Insufficient documentation

## 2011-10-19 HISTORY — PX: ESOPHAGOGASTRODUODENOSCOPY: SHX5428

## 2011-10-19 HISTORY — DX: Esophageal obstruction: K22.2

## 2011-10-19 HISTORY — DX: Shortness of breath: R06.02

## 2011-10-19 HISTORY — DX: Pneumonia, unspecified organism: J18.9

## 2011-10-19 HISTORY — PX: BALLOON DILATION: SHX5330

## 2011-10-19 SURGERY — EGD (ESOPHAGOGASTRODUODENOSCOPY)
Anesthesia: Monitor Anesthesia Care

## 2011-10-19 MED ORDER — SUCRALFATE 1 GM/10ML PO SUSP: 1.0000 g | Freq: Four times a day (QID) | ORAL | Status: DC

## 2011-10-19 MED ORDER — SODIUM CHLORIDE 0.9 % IV SOLN: Freq: Once | INTRAVENOUS | Status: DC

## 2011-10-19 MED ORDER — ALBUTEROL SULFATE (5 MG/ML) 0.5% IN NEBU
2.5000 mg | INHALATION_SOLUTION | Freq: Once | RESPIRATORY_TRACT | Status: AC
  Administered 2011-10-19: 2.5 mg via RESPIRATORY_TRACT

## 2011-10-19 MED ORDER — EPHEDRINE SULFATE 50 MG/ML IJ SOLN: INTRAMUSCULAR | Status: DC | PRN

## 2011-10-19 MED ORDER — LACTATED RINGERS IV SOLN
INTRAVENOUS | Status: DC
  Administered 2011-10-19: 1000 mL via INTRAVENOUS

## 2011-10-19 MED ORDER — PROPOFOL 10 MG/ML IV EMUL
INTRAVENOUS | Status: DC | PRN
  Administered 2011-10-19: 140 ug/kg/min via INTRAVENOUS

## 2011-10-19 MED ORDER — FENTANYL CITRATE 0.05 MG/ML IJ SOLN: 25.0000 ug | INTRAMUSCULAR | Status: DC | PRN

## 2011-10-19 MED ORDER — LACTATED RINGERS IV SOLN
INTRAVENOUS | Status: DC | PRN
  Administered 2011-10-19: 12:00:00 via INTRAVENOUS

## 2011-10-19 MED ORDER — ALBUTEROL SULFATE (5 MG/ML) 0.5% IN NEBU
INHALATION_SOLUTION | RESPIRATORY_TRACT | Status: AC
  Filled 2011-10-19: qty 0.5

## 2011-10-19 MED ORDER — MIDAZOLAM HCL 5 MG/5ML IJ SOLN
INTRAMUSCULAR | Status: DC | PRN
  Administered 2011-10-19: 1 mg via INTRAVENOUS

## 2011-10-19 MED ORDER — BUTAMBEN-TETRACAINE-BENZOCAINE 2-2-14 % EX AERO
INHALATION_SPRAY | CUTANEOUS | Status: DC | PRN
  Administered 2011-10-19: 2 via TOPICAL

## 2011-10-19 MED ORDER — PROMETHAZINE HCL 25 MG/ML IJ SOLN: 6.2500 mg | INTRAMUSCULAR | Status: DC | PRN

## 2011-10-19 NOTE — Op Note (Signed)
[facility] [address] [place], [place]  [address]  ENDOSCOPY PROCEDURE REPORT  PATIENT:  [name], [name]  MR#:  [id number] BIRTHDATE:  1929/01/10, 82 yrs. old  GENDER:  female ENDOSCOPIST:  Rachael Fee, MD PROCEDURE DATE:  10/19/2011 PROCEDURE:  EGD with biopsy, 43239 ASA CLASS:  Class III INDICATIONS:  remote esopahgeal surgery and gastric pull up; 2009 EGD found anastomosis at 18cm from incisors MEDICATIONS:   MAC sedation, administered by CRNA TOPICAL ANESTHETIC:  Cetacaine Spray DESCRIPTION OF PROCEDURE:   After the risks benefits and alternatives of the procedure were thoroughly explained, informed consent was obtained.  The Pentax Gastroscope [id number] endoscope was introduced through the mouth and advanced to the second portion of the duodenum, without limitations.  The instrument was slowly withdrawn as the mucosa was fully examined. <<PROCEDUREIMAGES>> Esophago-gastric anastomosis 17-18cm. This was somewhat narrowed, lumen 8-2mm, creating focal shelf-like deformity. This was dilated with CRE TTS 15mm held inflated for 45 seconds (see image5). Moderate gastritis was found. There was moderate pan-gastritis, biopsies taken to check for H. pylori. The distal stomach anatomy was tortuous but not strictured or stenotic (see image3 and image2).  Otherwise the examination was normal (see image4). Retroflexed views revealed not done.    The scope was then withdrawn from the patient and the procedure completed. COMPLICATIONS:  None  ENDOSCOPIC IMPRESSION: 1) Anastomosis at 17-18cm from incisors. This was focally stenotic and was dilated to 15mm. 2) Moderate gastritis, biopsied to check for H. pylori 3) Otherwise normal examination  RECOMMENDATIONS: Will add carafate (10ml three times daily).  New prescription was called into your pharmacy. IF biopsies show H. pylori you will be started on appropriate antibiotics.  ______________________________ Rachael Fee, MD  n. eSIGNED:   Rachael Fee at 10/19/2011 12:41 PM  Candie Chroman, [id number]

## 2011-10-19 NOTE — Anesthesia Preprocedure Evaluation (Signed)
Anesthesia Evaluation  Patient identified by MRN, date of birth, ID band Patient awake    Reviewed: Allergy & Precautions, H&P , NPO status , Patient's Chart, lab work & pertinent test results  History of Anesthesia Complications Negative for: history of anesthetic complications  Airway Mallampati: II TM Distance: >3 FB Neck ROM: Full    Dental  (+) Caps and Teeth Intact   Pulmonary shortness of breath and at rest, asthma , pneumonia ,  Hx frequent aspiration pneumonitis + rhonchi         Cardiovascular hypertension, Pt. on medications Rhythm:Regular Rate:Normal  History of atypical chest pain.   Neuro/Psych negative neurological ROS  negative psych ROS   GI/Hepatic Neg liver ROS, GERD-  Medicated,Hx esophageal cancer; s/p esophagectomy and gastric pull up.   Endo/Other  negative endocrine ROS  Renal/GU negative Renal ROS  negative genitourinary   Musculoskeletal negative musculoskeletal ROS (+)   Abdominal   Peds negative pediatric ROS (+)  Hematology negative hematology ROS (+)   Anesthesia Other Findings   Reproductive/Obstetrics negative OB ROS                           Anesthesia Physical Anesthesia Plan  ASA: III  Anesthesia Plan: MAC   Post-op Pain Management:    Induction: Intravenous  Airway Management Planned: Natural Airway  Additional Equipment:   Intra-op Plan:   Post-operative Plan:   Informed Consent: I have reviewed the patients History and Physical, chart, labs and discussed the procedure including the risks, benefits and alternatives for the proposed anesthesia with the patient or authorized representative who has indicated his/her understanding and acceptance.   Dental advisory given  Plan Discussed with: CRNA  Anesthesia Plan Comments:         Anesthesia Quick Evaluation

## 2011-10-19 NOTE — H&P (View-Only) (Signed)
Reviewed and agree with management. Krystian Younglove T. Hatsue Sime MD FACG 

## 2011-10-19 NOTE — Transfer of Care (Signed)
Immediate Anesthesia Transfer of Care Note  Patient: [name]  Procedure(s) Performed: Procedure(s) (LRB): ESOPHAGOGASTRODUODENOSCOPY (EGD) (N/A) BALLOON DILATION (N/A)  Patient Location: PACU  Anesthesia Type: MAC  Level of Consciousness: awake, alert , oriented and patient cooperative  Airway & Oxygen Therapy: Patient Spontanous Breathing and Patient connected to nasal cannula oxygen  Post-op Assessment: Report given to PACU RN and Post -op Vital signs reviewed and stable  Post vital signs: Reviewed and stable  Complications: No apparent anesthesia complications

## 2011-10-19 NOTE — Discharge Instructions (Signed)

## 2011-10-19 NOTE — Interval H&P Note (Signed)
History and Physical Interval Note:  10/19/2011 11:14 AM  [name]  has presented today for surgery, with the diagnosis of Recurrent Aspirationw/pneumonia  The various methods of treatment have been discussed with the patient and family. After consideration of risks, benefits and other options for treatment, the patient has consented to  Procedure(s) (LRB): ESOPHAGOGASTRODUODENOSCOPY (EGD) (N/A) BALLOON DILATION (N/A) as a surgical intervention .  The patients' history has been reviewed, patient examined, no change in status, stable for surgery.  I have reviewed the patients' chart and labs.  Questions were answered to the patient's satisfaction.     Rob Bunting

## 2011-10-20 ENCOUNTER — Encounter (HOSPITAL_COMMUNITY): Payer: Self-pay | Admitting: Gastroenterology

## 2011-10-20 NOTE — Anesthesia Postprocedure Evaluation (Signed)
Anesthesia Post Note  Patient: [name]  Procedure(s) Performed: Procedure(s) (LRB): ESOPHAGOGASTRODUODENOSCOPY (EGD) (N/A) BALLOON DILATION (N/A)  Anesthesia type: MAC  Patient location: PACU  Post pain: Pain level controlled  Post assessment: Post-op Vital signs reviewed  Last Vitals:  Filed Vitals:   10/19/11 1315  BP: 118/78  Temp:   Resp: 16    Post vital signs: Reviewed  Level of consciousness: sedated  Complications: No apparent anesthesia complications

## 2011-10-31 ENCOUNTER — Ambulatory Visit (INDEPENDENT_AMBULATORY_CARE_PROVIDER_SITE_OTHER): Payer: Medicare Other | Admitting: Internal Medicine

## 2011-10-31 ENCOUNTER — Other Ambulatory Visit: Payer: Self-pay | Admitting: *Deleted

## 2011-10-31 ENCOUNTER — Encounter: Payer: Self-pay | Admitting: Internal Medicine

## 2011-10-31 VITALS — BP 110/70 | HR 72 | Temp 98.2°F | Resp 16 | Ht 63.0 in | Wt 118.0 lb

## 2011-10-31 DIAGNOSIS — D509 Iron deficiency anemia, unspecified: Secondary | ICD-10-CM

## 2011-10-31 DIAGNOSIS — D72829 Elevated white blood cell count, unspecified: Secondary | ICD-10-CM

## 2011-10-31 DIAGNOSIS — R1310 Dysphagia, unspecified: Secondary | ICD-10-CM

## 2011-10-31 DIAGNOSIS — I10 Essential (primary) hypertension: Secondary | ICD-10-CM

## 2011-10-31 DIAGNOSIS — E538 Deficiency of other specified B group vitamins: Secondary | ICD-10-CM

## 2011-10-31 LAB — VITAMIN B12: Vitamin B-12: >1500 pg/mL — ABNORMAL HIGH (ref 211–911)

## 2011-10-31 LAB — CBC WITH DIFFERENTIAL/PLATELET
Basophils Absolute: 0 10*3/uL (ref 0.0–0.1)
Eosinophils Relative: 2 % (ref 0.0–5.0)
HCT: 44 % (ref 36.0–46.0)
Lymphocytes Relative: 43.6 % (ref 12.0–46.0)
Monocytes Relative: 10.4 % (ref 3.0–12.0)
Neutrophils Relative %: 43.7 % (ref 43.0–77.0)
Platelets: 238 10*3/uL (ref 150.0–400.0)
RDW: 14.9 % — ABNORMAL HIGH (ref 11.5–14.6)
WBC: 4.7 10*3/uL (ref 4.5–10.5)

## 2011-10-31 LAB — BASIC METABOLIC PANEL
CO2: 31 mEq/L (ref 19–32)
Calcium: 9.5 mg/dL (ref 8.4–10.5)
Chloride: 101 mEq/L (ref 96–112)
Glucose, Bld: 105 mg/dL — ABNORMAL HIGH (ref 70–99)
Potassium: 4.4 mEq/L (ref 3.5–5.1)
Sodium: 140 mEq/L (ref 135–145)

## 2011-10-31 LAB — IRON: Iron: 159 ug/dL — ABNORMAL HIGH (ref 42–145)

## 2011-10-31 MED ORDER — SUCRALFATE 1 GM/10ML PO SUSP: 1.0000 g | Freq: Four times a day (QID) | ORAL | Status: DC

## 2011-10-31 MED ORDER — ZOLPIDEM TARTRATE 5 MG PO TABS: 5.0000 mg | ORAL_TABLET | Freq: Every evening | ORAL | Status: DC | PRN

## 2011-10-31 NOTE — Patient Instructions (Signed)
Recommend Ensure or boost at least once a day

## 2011-10-31 NOTE — Progress Notes (Signed)
Subjective:    Patient ID: [name], female    DOB: July 13, 1929, 78 y.o.   MRN: [id number]  HPI Pt was admitted for refractory vomiting and reflux ( post pull through esophageal reconstruction) She developed a stricture and food was catching above the stricture creating a chronic nausea and vomiting scenario.  She was admitted was a long hospital underwent dilation of the stricture with resolution of the acute vomiting.  She has chronic gastroesophageal reflux due to the surgical reconstruction of her esophagus. She has a history of recurrent aspiration pneumonias due to that reflux.  She is also treated for hypertension iron deficiency anemia B12 deficiencies.  She has noticed an excellent response to the Carafate. But she has also noticed a decrease in appetite and has experienced weight loss.    Review of Systems  Constitutional: Negative for activity change, appetite change and fatigue.  HENT: Negative for ear pain, congestion, neck pain, postnasal drip and sinus pressure.   Eyes: Negative for redness and visual disturbance.  Respiratory: Negative for cough, shortness of breath and wheezing.   Gastrointestinal: Negative for abdominal pain and abdominal distention.  Genitourinary: Negative for dysuria, frequency and menstrual problem.  Musculoskeletal: Negative for myalgias, joint swelling and arthralgias.  Skin: Negative for rash and wound.  Neurological: Negative for dizziness, weakness and headaches.  Hematological: Negative for adenopathy. Does not bruise/bleed easily.  Psychiatric/Behavioral: Negative for sleep disturbance and decreased concentration.   Past Medical History  Diagnosis Date  . GERD (gastroesophageal reflux disease)   . Osteoporosis   . Postmenopausal HRT (hormone replacement therapy)   . Esophageal cancer   . Anemia   . Hypertension   . Allergy   . Asthma   . Shortness of breath   . Pneumonia     hx of pneumonia    History   Social  History  . Marital Status: Single    Spouse Name: N/A    Number of Children: 1  . Years of Education: N/A   Occupational History  . Retired    Social History Main Topics  . Smoking status: Never Smoker   . Smokeless tobacco: Never Used  . Alcohol Use: No  . Drug Use: No  . Sexually Active: Yes   Other Topics Concern  . Not on file   Social History Narrative   DAILY CAFFEINE USE    Past Surgical History  Procedure Date  . Left oophorectomy   . Esophageal removal of cancer,gastric ppull-thru 1993   . Cholecystectomy   . Rt arm fx   . Oophorectomy   . Esophagogastroduodenoscopy 10/19/2011    Procedure: ESOPHAGOGASTRODUODENOSCOPY (EGD);  Surgeon: Rachael Fee, MD;  Location: [facility];  Service: Endoscopy;  Laterality: N/A;  . Balloon dilation 10/19/2011    Procedure: BALLOON DILATION;  Surgeon: Rachael Fee, MD;  Location: [facility];  Service: Endoscopy;  Laterality: N/A;    Family History  Problem Relation Age of Onset  . Cancer Mother     cervical  . Heart disease Father   . Coronary artery disease    . Hypotension Sister     Allergies  Allergen Reactions  . Erythromycin Ethylsuccinate     REACTION: unspecified  . Prednisone     REACTION: unspecified    Current Outpatient Prescriptions on File Prior to Visit  Medication Sig Dispense Refill  . albuterol (PROVENTIL HFA;VENTOLIN HFA) 108 (90 BASE) MCG/ACT inhaler Inhale 2 puffs into the lungs every 6 (six) hours as needed.        Marland Kitchen  B Complex-C-Min-Fe-FA (HEMOCYTE PLUS) 106-1 MG CAPS Take 106 mg by mouth 2 (two) times daily.  180 each  3  . bisoprolol-hydrochlorothiazide (ZIAC) 2.5-6.25 MG per tablet Take 1 tablet by mouth daily.  90 tablet  3  . Calcium-Vitamin D-Vitamin K (VIACTIV) 500-100-40 MG-UNT-MCG CHEW Chew by mouth 3 (three) times daily.        . ergocalciferol (VITAMIN D2) 50000 UNITS capsule Take 1 capsule (50,000 Units total) by mouth once a week.  12 capsule  3  . lansoprazole (PREVACID  SOLUTAB) 30 MG disintegrating tablet Take 2 tablets (60 mg total) by mouth 2 (two) times daily.  360 tablet  3  . levocetirizine (XYZAL) 5 MG tablet Take 5 mg by mouth every evening.       . metoCLOPramide (REGLAN) 5 MG tablet Take 1 tablet (5 mg total) by mouth 2 (two) times daily.  180 tablet  3  . promethazine (PHENERGAN) 25 MG suppository Place 25 mg rectally every 6 (six) hours as needed.        . sucralfate (CARAFATE) 1 GM/10ML suspension Take 1 g by mouth 4 (four) times daily -  with meals and at bedtime.      Marland Kitchen DISCONTD: sucralfate (CARAFATE) 1 GM/10ML suspension Take 10 mLs (1 g total) by mouth 3 (three) times daily.  420 mL  3  . DISCONTD: sucralfate (CARAFATE) 1 GM/10ML suspension Take 10 mLs (1 g total) by mouth 4 (four) times daily.  420 mL  1  . DISCONTD: zolpidem (AMBIEN) 5 MG tablet Take 1 tablet (5 mg total) by mouth at bedtime as needed for sleep.  90 tablet  1    BP 110/70  Pulse 72  Temp 98.2 F (36.8 C)  Resp 16  Ht 5\' 3"  (1.6 m)  Wt 118 lb (53.524 kg)  BMI 20.90 kg/m2       Objective:   Physical Exam  Constitutional: She is oriented to person, place, and time. She appears well-developed and well-nourished. No distress.  HENT:  Head: Normocephalic and atraumatic.  Right Ear: External ear normal.  Left Ear: External ear normal.  Nose: Nose normal.  Mouth/Throat: Oropharynx is clear and moist.  Eyes: Conjunctivae and EOM are normal. Pupils are equal, round, and reactive to light.  Neck: Normal range of motion. Neck supple. No JVD present. No tracheal deviation present. No thyromegaly present.  Cardiovascular: Normal rate, regular rhythm, normal heart sounds and intact distal pulses.   No murmur heard. Pulmonary/Chest: Effort normal and breath sounds normal. She has no wheezes. She exhibits no tenderness.  Abdominal: Soft. Bowel sounds are normal.  Musculoskeletal: Normal range of motion. She exhibits no edema and no tenderness.  Lymphadenopathy:    She has no  cervical adenopathy.  Neurological: She is alert and oriented to person, place, and time. She has normal reflexes. No cranial nerve deficit.  Skin: Skin is warm and dry. She is not diaphoretic.  Psychiatric: She has a normal mood and affect. Her behavior is normal.          Assessment & Plan:  Patient was treated for a stricture and has done well since that time with resolution of some of the nausea and definitely resolution of the vomiting she has a history of B12 deficiency iron deficiency and anemia his history of hypertension well-controlled.  We'll monitor appropriate blood work today.  We have discussed the use of an appetite stimulant such as cream sherry approximately 1 ounce before meals stimulate appetite.  She  will continue the Carafate if she is taking.  We will monitor for the leukocytosis seen prior to her procedure to make sure that the white count has returned to normal.

## 2011-11-05 ENCOUNTER — Emergency Department (HOSPITAL_COMMUNITY): Payer: Medicare Other

## 2011-11-05 ENCOUNTER — Encounter (HOSPITAL_COMMUNITY): Payer: Self-pay | Admitting: Nurse Practitioner

## 2011-11-05 ENCOUNTER — Inpatient Hospital Stay (HOSPITAL_COMMUNITY)
Admission: EM | Admit: 2011-11-05 | Discharge: 2011-11-10 | DRG: 392 | Disposition: A | Discharge status: Home / Self Care | Payer: Medicare Other | Attending: Internal Medicine | Admitting: Internal Medicine

## 2011-11-05 DIAGNOSIS — E538 Deficiency of other specified B group vitamins: Secondary | ICD-10-CM

## 2011-11-05 DIAGNOSIS — R1110 Vomiting, unspecified: Secondary | ICD-10-CM | POA: Diagnosis present

## 2011-11-05 DIAGNOSIS — M810 Age-related osteoporosis without current pathological fracture: Secondary | ICD-10-CM

## 2011-11-05 DIAGNOSIS — K219 Gastro-esophageal reflux disease without esophagitis: Secondary | ICD-10-CM | POA: Diagnosis present

## 2011-11-05 DIAGNOSIS — Z66 Do not resuscitate: Secondary | ICD-10-CM | POA: Diagnosis present

## 2011-11-05 DIAGNOSIS — K3184 Gastroparesis: Principal | ICD-10-CM | POA: Diagnosis present

## 2011-11-05 DIAGNOSIS — Z86010 Personal history of colonic polyps: Secondary | ICD-10-CM

## 2011-11-05 DIAGNOSIS — G4700 Insomnia, unspecified: Secondary | ICD-10-CM

## 2011-11-05 DIAGNOSIS — K222 Esophageal obstruction: Secondary | ICD-10-CM

## 2011-11-05 DIAGNOSIS — K2970 Gastritis, unspecified, without bleeding: Secondary | ICD-10-CM

## 2011-11-05 DIAGNOSIS — I10 Essential (primary) hypertension: Secondary | ICD-10-CM | POA: Diagnosis present

## 2011-11-05 DIAGNOSIS — R05 Cough: Secondary | ICD-10-CM

## 2011-11-05 DIAGNOSIS — K311 Adult hypertrophic pyloric stenosis: Secondary | ICD-10-CM | POA: Diagnosis present

## 2011-11-05 DIAGNOSIS — Z8501 Personal history of malignant neoplasm of esophagus: Secondary | ICD-10-CM

## 2011-11-05 DIAGNOSIS — D509 Iron deficiency anemia, unspecified: Secondary | ICD-10-CM

## 2011-11-05 DIAGNOSIS — J690 Pneumonitis due to inhalation of food and vomit: Secondary | ICD-10-CM

## 2011-11-05 DIAGNOSIS — Z79890 Hormone replacement therapy (postmenopausal): Secondary | ICD-10-CM

## 2011-11-05 DIAGNOSIS — D649 Anemia, unspecified: Secondary | ICD-10-CM | POA: Diagnosis present

## 2011-11-05 DIAGNOSIS — D72829 Elevated white blood cell count, unspecified: Secondary | ICD-10-CM | POA: Diagnosis present

## 2011-11-05 DIAGNOSIS — J309 Allergic rhinitis, unspecified: Secondary | ICD-10-CM

## 2011-11-05 DIAGNOSIS — R933 Abnormal findings on diagnostic imaging of other parts of digestive tract: Secondary | ICD-10-CM

## 2011-11-05 DIAGNOSIS — J45909 Unspecified asthma, uncomplicated: Secondary | ICD-10-CM | POA: Diagnosis present

## 2011-11-05 DIAGNOSIS — Z79899 Other long term (current) drug therapy: Secondary | ICD-10-CM

## 2011-11-05 LAB — COMPREHENSIVE METABOLIC PANEL
Alkaline Phosphatase: 68 U/L (ref 39–117)
BUN: 23 mg/dL (ref 6–23)
Creatinine, Ser: 0.94 mg/dL (ref 0.50–1.10)
GFR calc Af Amer: 65 mL/min — ABNORMAL LOW (ref >90)
Glucose, Bld: 161 mg/dL — ABNORMAL HIGH (ref 70–99)
Potassium: 3.5 mEq/L (ref 3.5–5.1)
Total Bilirubin: 0.9 mg/dL (ref 0.3–1.2)
Total Protein: 7.3 g/dL (ref 6.0–8.3)

## 2011-11-05 LAB — LIPASE, BLOOD: Lipase: 37 U/L (ref 11–59)

## 2011-11-05 LAB — URINALYSIS, ROUTINE W REFLEX MICROSCOPIC
Ketones, ur: 15 mg/dL — AB
Nitrite: NEGATIVE
Specific Gravity, Urine: 1.031 — ABNORMAL HIGH (ref 1.005–1.030)
Urobilinogen, UA: 0.2 mg/dL (ref 0.0–1.0)

## 2011-11-05 LAB — DIFFERENTIAL
Eosinophils Absolute: 0 10*3/uL (ref 0.0–0.7)
Lymphs Abs: 1.6 10*3/uL (ref 0.7–4.0)
Monocytes Absolute: 1.2 10*3/uL — ABNORMAL HIGH (ref 0.1–1.0)
Monocytes Relative: 9 % (ref 3–12)
Neutrophils Relative %: 78 % — ABNORMAL HIGH (ref 43–77)

## 2011-11-05 LAB — CBC
HCT: 47.6 % — ABNORMAL HIGH (ref 36.0–46.0)
Hemoglobin: 16.2 g/dL — ABNORMAL HIGH (ref 12.0–15.0)
MCH: 29 pg (ref 26.0–34.0)
MCHC: 34 g/dL (ref 30.0–36.0)
MCV: 85.3 fL (ref 78.0–100.0)
RBC: 5.58 MIL/uL — ABNORMAL HIGH (ref 3.87–5.11)

## 2011-11-05 LAB — URINE MICROSCOPIC-ADD ON

## 2011-11-05 MED ORDER — ALBUTEROL SULFATE (5 MG/ML) 0.5% IN NEBU: 2.5000 mg | INHALATION_SOLUTION | RESPIRATORY_TRACT | Status: DC | PRN

## 2011-11-05 MED ORDER — MORPHINE SULFATE 2 MG/ML IJ SOLN: 2.0000 mg | INTRAMUSCULAR | Status: DC | PRN

## 2011-11-05 MED ORDER — ONDANSETRON HCL 4 MG/2ML IJ SOLN
INTRAMUSCULAR | Status: AC
  Administered 2011-11-05: 4 mg via INTRAVENOUS
  Filled 2011-11-05: qty 2

## 2011-11-05 MED ORDER — ONDANSETRON HCL 4 MG/2ML IJ SOLN: 4.0000 mg | Freq: Four times a day (QID) | INTRAMUSCULAR | Status: DC | PRN

## 2011-11-05 MED ORDER — ONDANSETRON HCL 4 MG/2ML IJ SOLN
4.0000 mg | Freq: Once | INTRAMUSCULAR | Status: AC
  Administered 2011-11-05: 4 mg via INTRAVENOUS
  Filled 2011-11-05: qty 2

## 2011-11-05 MED ORDER — PANTOPRAZOLE SODIUM 40 MG IV SOLR
40.0000 mg | Freq: Every day | INTRAVENOUS | Status: DC
  Administered 2011-11-05 – 2011-11-07 (×3): 40 mg via INTRAVENOUS
  Filled 2011-11-05 (×4): qty 40

## 2011-11-05 MED ORDER — LIDOCAINE HCL 2 % EX GEL
Freq: Once | CUTANEOUS | Status: AC
  Administered 2011-11-05: 20

## 2011-11-05 MED ORDER — GUAIFENESIN-DM 100-10 MG/5ML PO SYRP
5.0000 mL | ORAL_SOLUTION | ORAL | Status: DC | PRN
  Filled 2011-11-05: qty 5

## 2011-11-05 MED ORDER — ACETAMINOPHEN 650 MG RE SUPP: 650.0000 mg | Freq: Four times a day (QID) | RECTAL | Status: DC | PRN

## 2011-11-05 MED ORDER — SODIUM CHLORIDE 0.9 % IV BOLUS (SEPSIS)
1000.0000 mL | Freq: Once | INTRAVENOUS | Status: AC
  Administered 2011-11-05: 1000 mL via INTRAVENOUS

## 2011-11-05 MED ORDER — ACETAMINOPHEN 325 MG PO TABS: 650.0000 mg | ORAL_TABLET | Freq: Four times a day (QID) | ORAL | Status: DC | PRN

## 2011-11-05 MED ORDER — FAMOTIDINE IN NACL 20-0.9 MG/50ML-% IV SOLN
20.0000 mg | Freq: Once | INTRAVENOUS | Status: AC
  Administered 2011-11-05: 20 mg via INTRAVENOUS
  Filled 2011-11-05: qty 50

## 2011-11-05 MED ORDER — SODIUM CHLORIDE 0.9 % IJ SOLN
3.0000 mL | Freq: Two times a day (BID) | INTRAMUSCULAR | Status: DC
  Administered 2011-11-06 – 2011-11-10 (×4): 3 mL via INTRAVENOUS

## 2011-11-05 MED ORDER — ONDANSETRON HCL 4 MG/2ML IJ SOLN
4.0000 mg | Freq: Once | INTRAMUSCULAR | Status: AC
  Administered 2011-11-05: 4 mg via INTRAVENOUS

## 2011-11-05 MED ORDER — LIDOCAINE HCL 2 % EX GEL
CUTANEOUS | Status: AC
  Administered 2011-11-05: 20
  Filled 2011-11-05: qty 20

## 2011-11-05 MED ORDER — FENTANYL CITRATE 0.05 MG/ML IJ SOLN
50.0000 ug | Freq: Once | INTRAMUSCULAR | Status: AC
  Administered 2011-11-05: 50 ug via INTRAVENOUS
  Filled 2011-11-05: qty 2

## 2011-11-05 MED ORDER — SODIUM CHLORIDE 0.9 % IV SOLN
Freq: Once | INTRAVENOUS | Status: AC
  Administered 2011-11-05: 23:00:00 via INTRAVENOUS

## 2011-11-05 MED ORDER — SODIUM CHLORIDE 0.9 % IV SOLN: Freq: Once | INTRAVENOUS | Status: DC

## 2011-11-05 MED ORDER — LORAZEPAM 2 MG/ML IJ SOLN
0.5000 mg | Freq: Three times a day (TID) | INTRAMUSCULAR | Status: DC | PRN
  Administered 2011-11-05 – 2011-11-09 (×5): 0.5 mg via INTRAVENOUS
  Filled 2011-11-05 (×5): qty 1

## 2011-11-05 MED ORDER — ONDANSETRON HCL 4 MG PO TABS: 4.0000 mg | ORAL_TABLET | Freq: Four times a day (QID) | ORAL | Status: DC | PRN

## 2011-11-05 NOTE — ED Notes (Signed)
Patient is unable to void at present. 

## 2011-11-05 NOTE — ED Notes (Signed)
grandaughter at bedside

## 2011-11-05 NOTE — ED Notes (Signed)
NG-tube placed, pt tolerated procedure well - aprox gastric content removed, yellow/clear in color.

## 2011-11-05 NOTE — ED Notes (Signed)
Patient transported to CT 

## 2011-11-05 NOTE — ED Notes (Signed)
Patient transported to X-ray for NG-tube placement.

## 2011-11-05 NOTE — H&P (Signed)
PCP:   Carrie Mew, MD, MD  GI: Christella Hartigan  Chief Complaint:  Nausea and chest pressure  HPI: [name] is a 78 y.o. female   has a past medical history of GERD (gastroesophageal reflux disease); Osteoporosis; Postmenopausal HRT (hormone replacement therapy); Esophageal cancer; Anemia; Hypertension; Allergy; Asthma; Shortness of breath; and Pneumonia.   Presented with  Patient has history of esophageal cancer sp esophagectomy with stomach pull through. She ahs had trouble with esophageal strictures and just recently had dilatation done. Since Wednesday she had nausea and sensation of pain/ pressure in her chest. She came to ED and had CT scan done that showed very dialated stomach with likely stomach outlet obstruction. She had an NG tube placed with about 1500 ml out put.     Review of Systems:    Pertinent positives include: nausea, vomiting,  Constitutional:  No weight loss, night sweats, Fevers, chills, fatigue, weight loss  HEENT:  No headaches, Difficulty swallowing,Tooth/dental problems,Sore throat,  No sneezing, itching, ear ache, nasal congestion, post nasal drip,  Cardio-vascular:  No chest pain, Orthopnea, PND, anasarca, dizziness, palpitations.no Bilateral lower extremity swelling  GI:  No heartburn, indigestion, abdominal pain,  diarrhea, change in bowel habits, loss of appetite, melena, blood in stool, hematoemesis Resp:  no shortness of breath at rest. No dyspnea on exertion, No excess mucus, no productive cough, No non-productive cough, No coughing up of blood.No change in color of mucus.No wheezing. Skin:  no rash or lesions. No jaundice GU:  no dysuria, change in color of urine, no urgency or frequency. No straining to urinate.  No flank pain.  Musculoskeletal:  No joint pain or no joint swelling. No decreased range of motion. No back pain.  Psych:  No change in mood or affect. No depression or anxiety. No memory loss.  Neuro: no localizing  neurological complaints, no tingling, no weakness, no double vision, no gait abnormality, no slurred speech, no confusion  Otherwise ROS are negative except for above, 10 systems were reviewed  Past Medical History: Past Medical History  Diagnosis Date  . GERD (gastroesophageal reflux disease)   . Osteoporosis   . Postmenopausal HRT (hormone replacement therapy)   . Esophageal cancer   . Anemia   . Hypertension   . Allergy   . Asthma   . Shortness of breath   . Pneumonia     hx of pneumonia   Past Surgical History  Procedure Date  . Left oophorectomy   . Esophageal removal of cancer,gastric ppull-thru 1993   . Cholecystectomy   . Rt arm fx   . Oophorectomy   . Esophagogastroduodenoscopy 10/19/2011    Procedure: ESOPHAGOGASTRODUODENOSCOPY (EGD);  Surgeon: Rachael Fee, MD;  Location: [facility];  Service: Endoscopy;  Laterality: N/A;  . Balloon dilation 10/19/2011    Procedure: BALLOON DILATION;  Surgeon: Rachael Fee, MD;  Location: [facility];  Service: Endoscopy;  Laterality: N/A;     Medications: Prior to Admission medications   Medication Sig Start Date End Date Taking? Authorizing Provider  albuterol (PROVENTIL HFA;VENTOLIN HFA) 108 (90 BASE) MCG/ACT inhaler Inhale 2 puffs into the lungs every 6 (six) hours as needed. For wheezing   Yes Historical Provider, MD  B Complex-C-Min-Fe-FA (HEMOCYTE PLUS) 106-1 MG CAPS Take 106 mg by mouth 2 (two) times daily. 08/31/11  Yes Stacie Glaze, MD  bisoprolol-hydrochlorothiazide [facility]) 2.5-6.25 MG per tablet Take 1 tablet by mouth daily. 05/27/11 05/26/12 Yes Stacie Glaze, MD  Calcium-Vitamin D-Vitamin K (VIACTIV) 500-100-40  MG-UNT-MCG CHEW Chew by mouth 3 (three) times daily.     Yes Historical Provider, MD  lansoprazole (PREVACID SOLUTAB) 30 MG disintegrating tablet Take 2 tablets (60 mg total) by mouth 2 (two) times daily. 07/04/11  Yes Rachael Fee, MD  levocetirizine (XYZAL) 5 MG tablet Take 5 mg by mouth every  evening.    Yes Historical Provider, MD  metoCLOPramide (REGLAN) 5 MG tablet Take 1 tablet (5 mg total) by mouth 2 (two) times daily. 08/29/11  Yes Stacie Glaze, MD  promethazine (PHENERGAN) 25 MG suppository Place 25 mg rectally every 6 (six) hours as needed. For nausea   Yes Historical Provider, MD  sucralfate (CARAFATE) 1 GM/10ML suspension Take 10 mLs (1 g total) by mouth 4 (four) times daily. 10/31/11 10/30/12 Yes Stacie Glaze, MD  Vitamin D, Ergocalciferol, (DRISDOL) 50000 UNITS CAPS Take 50,000 Units by mouth every 7 (seven) days. On Friday   Yes Historical Provider, MD  zolpidem (AMBIEN) 5 MG tablet Take 1 tablet (5 mg total) by mouth at bedtime as needed for sleep. 10/31/11 10/30/12 Yes Stacie Glaze, MD    Allergies:   Allergies  Allergen Reactions  . Erythromycin Ethylsuccinate Other (See Comments)    REACTION: unspecified  . Prednisone Other (See Comments)    REACTION: unspecified    Social History:  Ambulatory independently Lives at home   reports that she has never smoked. She has never used smokeless tobacco. She reports that she does not drink alcohol or use illicit drugs.   Family History: family history includes Cancer in her mother; Coronary artery disease in an unspecified family member; Heart disease in her father; and Hypotension in her sister.    Physical Exam: Patient Vitals for the past 24 hrs:  BP Temp Temp src Pulse Resp SpO2 Height Weight  11/05/11 2237 115/79 mmHg 97.2 F (36.2 C) Oral 84  18  93 % - -  11/05/11 2229 - - - - - - 5\' 3"  (1.6 m) 52.5 kg (115 lb 11.9 oz)  11/05/11 2201 118/79 mmHg 98.2 F (36.8 C) Oral 81  20  95 % - -  11/05/11 1855 123/82 mmHg - - - 20  - - -  11/05/11 1835 143/95 mmHg - - 101  24  95 % - -  11/05/11 1529 131/97 mmHg 97.8 F (36.6 C) Oral 89  16  97 % 5\' 3"  (1.6 m) 53.071 kg (117 lb)    1. General:  in No Acute distress 2. Psychological: Alert andOriented 3. Head/ENT:  Dry Mucous Membranes                           Head Non traumatic, neck supple                          Normal Dentition 4. SKIN:  decreased Skin turgor,  Skin clean Dry and intact no rash 5. Heart: Regular rate and rhythm no Murmur, Rub or gallop 6. Lungs: Clear to auscultation bilaterally, no wheezes or crackles   7. Abdomen: Soft, non-tender, Non distended 8. Lower extremities: no clubbing, cyanosis, or edema 9. Neurologically Grossly intact, moving all 4 extremities equally 10. MSK: Normal range of motion  body mass index is 20.50 kg/(m^2).   Labs on Admission:   [facility] 11/05/11 1651  NA 137  K 3.5  CL 91*  CO2 30  GLUCOSE 161*  BUN 23  CREATININE  0.94  CALCIUM 10.0  MG --  PHOS --    Basename 11/05/11 1651  AST 41*  ALT 14  ALKPHOS 68  BILITOT 0.9  PROT 7.3  ALBUMIN 4.1    Basename 11/05/11 1651  LIPASE 37  AMYLASE --    Basename 11/05/11 1651  WBC 12.6*  NEUTROABS 9.9*  HGB 16.2*  HCT 47.6*  MCV 85.3  PLT 352   No results found for this basename: CKTOTAL:3,CKMB:3,CKMBINDEX:3,TROPONINI:3 in the last 72 hours No results found for this basename: TSH,T4TOTAL,FREET3,T3FREE,THYROIDAB in the last 72 hours No results found for this basename: VITAMINB12:2,FOLATE:2,FERRITIN:2,TIBC:2,IRON:2,RETICCTPCT:2 in the last 72 hours No results found for this basename: HGBA1C    Estimated Creatinine Clearance: 39.5 ml/min (by C-G formula based on Cr of 0.94). ABG No results found for this basename: phart, pco2, po2, hco3, tco2, acidbasedef, o2sat     No results found for this basename: DDIMER      Cultures:    Component Value Date/Time   SDES BLOOD LEFT WRIST  5 ML IN EACH BOTTLE 06/12/2010 1010   SDES BLOOD LEFT HAND  3 ML IN EACH BOTTLE 06/12/2010 1010   SPECREQUEST NONE 06/12/2010 1010   SPECREQUEST NONE 06/12/2010 1010   CULT NO GROWTH 5 DAYS 06/12/2010 1010   CULT NO GROWTH 5 DAYS 06/12/2010 1010   REPTSTATUS 06/18/2010 FINAL 06/12/2010 1010   REPTSTATUS 06/18/2010 FINAL 06/12/2010 1010         Radiological Exams on Admission: Dg Chest 1 View  11/05/2011  *RADIOLOGY REPORT*  Clinical Data: Nasogastric tube placement.  CHEST - 1 VIEW  Comparison: 11/05/2011 chest radiograph from 5:04 p.m.  Findings: The patient has a gastric pull-through.  The nasogastric tube projects over the lower thorax in the lower portion of the gastric pull-through.  The intrathoracic stomach has been decompressed, and is significantly reduced in size compared to the prior chest radiograph.  Heart size is within normal limits.  There is mild atelectasis medially in both lung bases.  Tortuous thoracic aorta noted.  IMPRESSION:  1.  Nasogastric tube is in the lower part of the intrathoracic stomach, and has successfully decompressed the gastric pull- through. 2.  Mild atelectasis in both lung bases.  Original Report Authenticated By: Dellia Cloud, M.D.   Dg Chest 2 View  11/05/2011  *RADIOLOGY REPORT*  Clinical Data: Nausea and vomiting.  Esophageal cancer with gastric pull-through 1993  CHEST - 2 VIEW  Comparison: 10/17/2011  Findings: There has been significant change from the recent chest x- ray.  There is a large soft tissue density in the right paratracheal region as well as to the right of the heart.  Given history of gastric pull-through, this is most likely a fluid-filled distended stomach which may be obstructed.  Aneurysm and neoplasm considered unlikely given the time course as well as a history of prior gastric pull-through.  Cardiac enlargement.  Negative for heart failure.  No pleural effusion.  Probable COPD with hyperinflation of the lungs.  IMPRESSION: There has been any significant change from the  recent chest x-ray. There is a large soft tissue mass in the mediastinum projecting to the right of midline.  This is most likely a fluid filled obstructed stomach in this patient with a gastric pull-through. This could be confirmed with CT scanning.  Original Report Authenticated By: Camelia Phenes, M.D.    Ct Chest Wo Contrast  11/05/2011  *RADIOLOGY REPORT*  Clinical Data: Nausea, vomiting, status post gastric pull-through, rule out mediastinal mass.  CT CHEST WITHOUT CONTRAST  Technique:  Multidetector CT imaging of the chest was performed following the standard protocol without IV contrast.  Comparison: CT chest 11/05/2009 and chest x-ray 11/05/2011.  Findings: Again noted status post esophagectomy and gastric pull- through.  There is a marked gastric distention with fluid which is giving mass appearance on the chest x-ray.  There is mass effect on the heart mediastinal structures.  Findings are highly suspicious for gastric outlet obstruction and fluid retention in the stomach. Atherosclerotic calcifications of thoracic aorta are noted.  There is some mass effect on the trachea and main bronchi which are still patent.  There is no evidence of aspiration pneumonia.  Sagittal images of the spine shows osteopenia and mild degenerative changes thoracic spine.  Degenerative changes of the cervical spine are noted.  IMPRESSION:  1.  There is a status post esophagectomy with gastric pull through. There is marked distention of intrathoracic stomach with fluid. Findings are highly suspicious for gastric outlet obstruction. There is mass effect on the heart and mediastinal structures. 2.  No evidence of aspiration.  No pulmonary edema. 3.  Osteopenia and degenerative changes thoracic spine.  Critical findings discussed with Dr. Golda Acre from emergency room.  Original Report Authenticated By: Natasha Mead, M.D.    Assessment/Plan  82 you with history of esophageal ca sp stomach pull through here with likely pyloric outlet obstruction with symptomatic relief sp NG tube  Present on Admission:  .Gastric outlet obstruction - Will admit continue NG tube, will need to get Gi consult in AM .HYPERTENSION - continue to monitor hold off on PO meds for now .GERD - protonix IV .Vomiting - zofran and NG tube .Leukocytosis  could be hemoconcentration, will await results of UA, watch for aspiration at this point CT scan did not show any evidence of pnumonitis.    Prophylaxis: SCD  CODE STATUS: DNR/DNI  I have spent a total of 65 min on this admition  Lauryl Seyer 11/05/2011, 10:55 PM

## 2011-11-05 NOTE — ED Provider Notes (Addendum)
History     CSN: [id number]  Arrival date & time 11/05/11  1438   First MD Initiated Contact with Patient 11/05/11 1641      Chief Complaint  Patient presents with  . Emesis    (Consider location/radiation/quality/duration/timing/severity/associated sxs/prior treatment) HPI Comments: Patient with a known history of esophageal cancer and subsequent reconstruction.  She's had problems with strictures that have caused recurrent nausea and vomiting.  Patient most recently had been EGD with dilation on 10/19/2011.  Patient had been doing well since that time.  She'll followup with her primary care physician last Monday.  Over the last 3 days patient has had recurrent nausea and vomiting with generalized abdominal pain.  She's still having bowel movements.  No fevers.  Patient has been trying to use her Phenergan suppositories without relief.  Patient now feels that she may have pulled a muscle in her stomach from all of her emesis.  She's also concerned that she may have aspirated because she does have a history of aspiration pneumonias when she has these vomiting episodes.  Patient had seen by Dr. Christella Hartigan from GI and Dr. Lovell Sheehan as her primary care physician  Patient is a 78 y.o. female presenting with vomiting. The history is provided by the patient. No language interpreter was used.  Emesis  This is a recurrent problem. The current episode started more than 2 days ago. The problem occurs 5 to 10 times per day. The problem has not changed since onset.The emesis has an appearance of stomach contents. There has been no fever. Associated symptoms include abdominal pain and cough. Pertinent negatives include no chills, no diarrhea, no fever and no headaches.    Past Medical History  Diagnosis Date  . GERD (gastroesophageal reflux disease)   . Osteoporosis   . Postmenopausal HRT (hormone replacement therapy)   . Esophageal cancer   . Anemia   . Hypertension   . Allergy   . Asthma   . Shortness  of breath   . Pneumonia     hx of pneumonia    Past Surgical History  Procedure Date  . Left oophorectomy   . Esophageal removal of cancer,gastric ppull-thru 1993   . Cholecystectomy   . Rt arm fx   . Oophorectomy   . Esophagogastroduodenoscopy 10/19/2011    Procedure: ESOPHAGOGASTRODUODENOSCOPY (EGD);  Surgeon: Rachael Fee, MD;  Location: [facility];  Service: Endoscopy;  Laterality: N/A;  . Balloon dilation 10/19/2011    Procedure: BALLOON DILATION;  Surgeon: Rachael Fee, MD;  Location: [facility];  Service: Endoscopy;  Laterality: N/A;    Family History  Problem Relation Age of Onset  . Cancer Mother     cervical  . Heart disease Father   . Coronary artery disease    . Hypotension Sister     History  Substance Use Topics  . Smoking status: Never Smoker   . Smokeless tobacco: Never Used  . Alcohol Use: No    OB History    Grav Para Term Preterm Abortions TAB SAB Ect Mult Living                  Review of Systems  Constitutional: Negative.  Negative for fever and chills.  HENT: Negative.   Eyes: Negative.  Negative for discharge and redness.  Respiratory: Positive for cough. Negative for shortness of breath.   Cardiovascular: Negative.  Negative for chest pain.  Gastrointestinal: Positive for nausea, vomiting and abdominal pain. Negative for diarrhea.  Genitourinary: Negative.  Negative for dysuria and vaginal discharge.  Musculoskeletal: Negative.  Negative for back pain.  Skin: Negative.  Negative for color change and rash.  Neurological: Negative.  Negative for syncope and headaches.  Hematological: Negative.  Negative for adenopathy.  Psychiatric/Behavioral: Negative.  Negative for confusion.  All other systems reviewed and are negative.    Allergies  Erythromycin ethylsuccinate and Prednisone  Home Medications   Current Outpatient Rx  Name Route Sig Dispense Refill  . ALBUTEROL SULFATE HFA 108 (90 BASE) MCG/ACT IN AERS Inhalation Inhale  2 puffs into the lungs every 6 (six) hours as needed. For wheezing    . HEMOCYTE PLUS 106-1 MG PO CAPS Oral Take 106 mg by mouth 2 (two) times daily. 180 each 3  . BISOPROLOL-HYDROCHLOROTHIAZIDE 2.5-6.25 MG PO TABS Oral Take 1 tablet by mouth daily. 90 tablet 3  . CALCIUM-VITAMIN D-VITAMIN K 500-100-40 MG-UNT-MCG PO CHEW Oral Chew by mouth 3 (three) times daily.      Marland Kitchen LANSOPRAZOLE 30 MG PO TBDP Oral Take 2 tablets (60 mg total) by mouth 2 (two) times daily. 360 tablet 3  . LEVOCETIRIZINE DIHYDROCHLORIDE 5 MG PO TABS Oral Take 5 mg by mouth every evening.     Marland Kitchen METOCLOPRAMIDE HCL 5 MG PO TABS Oral Take 1 tablet (5 mg total) by mouth 2 (two) times daily. 180 tablet 3  . PROMETHAZINE HCL 25 MG RE SUPP Rectal Place 25 mg rectally every 6 (six) hours as needed. For nausea    . SUCRALFATE 1 GM/10ML PO SUSP Oral Take 10 mLs (1 g total) by mouth 4 (four) times daily. 3600 mL 3  . VITAMIN D (ERGOCALCIFEROL) 50000 UNITS PO CAPS Oral Take 50,000 Units by mouth every 7 (seven) days. On Friday    . ZOLPIDEM TARTRATE 5 MG PO TABS Oral Take 1 tablet (5 mg total) by mouth at bedtime as needed for sleep. 90 tablet 1    BP 131/97  Pulse 89  Temp(Src) 97.8 F (36.6 C) (Oral)  Resp 16  Ht 5\' 3"  (1.6 m)  Wt 117 lb (53.071 kg)  BMI 20.73 kg/m2  SpO2 97%  Physical Exam  Nursing note and vitals reviewed. Constitutional: She is oriented to person, place, and time. She appears well-developed and well-nourished.  Non-toxic appearance. She does not have a sickly appearance.  HENT:  Head: Normocephalic and atraumatic.  Eyes: Conjunctivae, EOM and lids are normal. Pupils are equal, round, and reactive to light. No scleral icterus.  Neck: Trachea normal and normal range of motion. Neck supple.  Cardiovascular: Normal rate, regular rhythm and normal heart sounds.   Pulmonary/Chest: Effort normal and breath sounds normal. No respiratory distress. She has no wheezes. She has no rales.  Abdominal: Soft. Normal  appearance. There is no tenderness. There is no rebound, no guarding and no CVA tenderness.  Musculoskeletal: Normal range of motion.  Neurological: She is alert and oriented to person, place, and time. She has normal strength.  Skin: Skin is warm, dry and intact. No rash noted.  Psychiatric: She has a normal mood and affect. Her behavior is normal. Judgment and thought content normal.    ED Course  Procedures (including critical care time)  Results for orders placed during the hospital encounter of 11/05/11  CBC      Component Value Range   WBC 12.6 (*) 4.0 - 10.5 (K/uL)   RBC 5.58 (*) 3.87 - 5.11 (MIL/uL)   Hemoglobin 16.2 (*) 12.0 - 15.0 (g/dL)   HCT 78.2 (*) 95.6 -  46.0 (%)   MCV 85.3  78.0 - 100.0 (fL)   MCH 29.0  26.0 - 34.0 (pg)   MCHC 34.0  30.0 - 36.0 (g/dL)   RDW 56.2  13.0 - 86.5 (%)   Platelets 352  150 - 400 (K/uL)  DIFFERENTIAL      Component Value Range   Neutrophils Relative 78 (*) 43 - 77 (%)   Neutro Abs 9.9 (*) 1.7 - 7.7 (K/uL)   Lymphocytes Relative 12  12 - 46 (%)   Lymphs Abs 1.6  0.7 - 4.0 (K/uL)   Monocytes Relative 9  3 - 12 (%)   Monocytes Absolute 1.2 (*) 0.1 - 1.0 (K/uL)   Eosinophils Relative 0  0 - 5 (%)   Eosinophils Absolute 0.0  0.0 - 0.7 (K/uL)   Basophils Relative 0  0 - 1 (%)   Basophils Absolute 0.0  0.0 - 0.1 (K/uL)  COMPREHENSIVE METABOLIC PANEL      Component Value Range   Sodium 137  135 - 145 (mEq/L)   Potassium 3.5  3.5 - 5.1 (mEq/L)   Chloride 91 (*) 96 - 112 (mEq/L)   CO2 30  19 - 32 (mEq/L)   Glucose, Bld 161 (*) 70 - 99 (mg/dL)   BUN 23  6 - 23 (mg/dL)   Creatinine, Ser 7.84  0.50 - 1.10 (mg/dL)   Calcium 69.6  8.4 - 10.5 (mg/dL)   Total Protein 7.3  6.0 - 8.3 (g/dL)   Albumin 4.1  3.5 - 5.2 (g/dL)   AST 41 (*) 0 - 37 (U/L)   ALT 14  0 - 35 (U/L)   Alkaline Phosphatase 68  39 - 117 (U/L)   Total Bilirubin 0.9  0.3 - 1.2 (mg/dL)   GFR calc non Af Amer 56 (*) >90 (mL/min)   GFR calc Af Amer 65 (*) >90 (mL/min)  LIPASE,  BLOOD      Component Value Range   Lipase 37  11 - 59 (U/L)   Dg Chest 2 View  11/05/2011  *RADIOLOGY REPORT*  Clinical Data: Nausea and vomiting.  Esophageal cancer with gastric pull-through 1993  CHEST - 2 VIEW  Comparison: 10/17/2011  Findings: There has been significant change from the recent chest x- ray.  There is a large soft tissue density in the right paratracheal region as well as to the right of the heart.  Given history of gastric pull-through, this is most likely a fluid-filled distended stomach which may be obstructed.  Aneurysm and neoplasm considered unlikely given the time course as well as a history of prior gastric pull-through.  Cardiac enlargement.  Negative for heart failure.  No pleural effusion.  Probable COPD with hyperinflation of the lungs.  IMPRESSION: There has been any significant change from the  recent chest x-ray. There is a large soft tissue mass in the mediastinum projecting to the right of midline.  This is most likely a fluid filled obstructed stomach in this patient with a gastric pull-through. This could be confirmed with CT scanning.  Original Report Authenticated By: Camelia Phenes, M.D.   Dg Chest 2 View  10/17/2011  *RADIOLOGY REPORT*  Clinical Data: Mid chest pain after eating for approximately 6 weeks, history hypertension, asthma, GERD, esophageal cancer  CHEST - 2 VIEW  Comparison: 12/22/2010  Findings: Enlargement of cardiac silhouette. Calcified tortuous aorta. Pulmonary vascularity normal. Surgical clips at the cervical region, mid chest and left upper quadrant suspect related to esophagectomy and gastric pull-up. Emphysematous and chronic  bronchitic changes. Bibasilar atelectasis versus scarring. No definite acute infiltrate, pleural effusion or pneumothorax. Bones demineralized.  IMPRESSION: Enlargement of cardiac silhouette. Emphysematous and chronic bronchitic changes with bibasilar atelectasis versus scarring.  Original Report Authenticated By: Lollie Marrow, M.D.      MDM  Patient with known history of problems with nausea and vomiting related to an esophageal cancer and subsequent reconstruction.  Going to check some recent laboratory studies and hydrate the patient with normal saline as well as give her Zofran for her nausea.  We'll treat her pain with fentanyl.  Check her urinalysis for UTI.  We'll also check a chest x-ray as the patient has a history of aspiration pneumonias.        Nat Christen, MD 11/05/11 1653  Nat Christen, MD 11/05/11 1901  Patient with gastric outlet obstruction at this time that'll require NG tube for drainage. Pt discussed with Dr. Rhea Belton from GI and he agrees that it is safe to place an NG tube in this patient for the gastric distention.  He plans that the patient should be n.p.o. overnight and he will see her in the morning and evaluate the next on whether or not she may need a repeat dilation.  I will admit the patient to medicine as she will need to be maintained n.p.o. overnight.  Nat Christen, MD 11/05/11 1916  Patient discussed with Dr. Adela Glimpse for admission.  Nat Christen, MD 11/05/11 [phone]

## 2011-11-05 NOTE — ED Notes (Signed)
Patient transported to X-ray 

## 2011-11-05 NOTE — ED Notes (Signed)
Returned from radiology.  Pt drowsy states her nausea is slightly better

## 2011-11-05 NOTE — ED Notes (Signed)
Family at bedside. 

## 2011-11-05 NOTE — ED Notes (Signed)
Attempted to give report x1 - secretary states room is not yet assigned to an RN yet and to call back in a few minutes.

## 2011-11-05 NOTE — ED Notes (Signed)
Pt c/o not feeling well, n/v, tremor over past months. Pt had endoscopy for same symptoms and found abd imflammation and food residual in esophagus. Pt reports she continues to have n/v and not feeling well. Family came to check on pt today and brought her to ed for eval

## 2011-11-05 NOTE — ED Notes (Signed)
Talking with family.states she has had nausea for many many months

## 2011-11-06 ENCOUNTER — Encounter (HOSPITAL_COMMUNITY): Payer: Self-pay | Admitting: *Deleted

## 2011-11-06 DIAGNOSIS — K311 Adult hypertrophic pyloric stenosis: Secondary | ICD-10-CM

## 2011-11-06 LAB — COMPREHENSIVE METABOLIC PANEL
ALT: 8 U/L (ref 0–35)
Albumin: 3.1 g/dL — ABNORMAL LOW (ref 3.5–5.2)
Alkaline Phosphatase: 61 U/L (ref 39–117)
Calcium: 8.9 mg/dL (ref 8.4–10.5)
Potassium: 2.2 mEq/L — CL (ref 3.5–5.1)
Sodium: 142 mEq/L (ref 135–145)
Total Protein: 5.4 g/dL — ABNORMAL LOW (ref 6.0–8.3)

## 2011-11-06 LAB — CBC
HCT: 40.8 % (ref 36.0–46.0)
Hemoglobin: 13.4 g/dL (ref 12.0–15.0)
MCHC: 32.8 g/dL (ref 30.0–36.0)
MCV: 86.8 fL (ref 78.0–100.0)
RDW: 14.6 % (ref 11.5–15.5)

## 2011-11-06 LAB — MAGNESIUM: Magnesium: 1.9 mg/dL (ref 1.5–2.5)

## 2011-11-06 LAB — TSH: TSH: 0.628 u[IU]/mL (ref 0.350–4.500)

## 2011-11-06 LAB — PHOSPHORUS: Phosphorus: 4.5 mg/dL (ref 2.3–4.6)

## 2011-11-06 LAB — GLUCOSE, CAPILLARY
Glucose-Capillary: 98 mg/dL (ref 70–99)
Glucose-Capillary: 96 mg/dL (ref 70–99)

## 2011-11-06 LAB — POTASSIUM: Potassium: 3.1 mEq/L — ABNORMAL LOW (ref 3.5–5.1)

## 2011-11-06 MED ORDER — PHENOL 1.4 % MT LIQD
1.0000 | OROMUCOSAL | Status: DC | PRN
  Administered 2011-11-06: 1 via OROMUCOSAL
  Filled 2011-11-06: qty 177

## 2011-11-06 MED ORDER — DEXTROSE-NACL 5-0.9 % IV SOLN
INTRAVENOUS | Status: DC
  Administered 2011-11-06 – 2011-11-07 (×2): via INTRAVENOUS
  Administered 2011-11-07: 500 mL via INTRAVENOUS
  Administered 2011-11-08 – 2011-11-09 (×4): via INTRAVENOUS

## 2011-11-06 MED ORDER — POTASSIUM CHLORIDE 10 MEQ/100ML IV SOLN: 10.0000 meq | INTRAVENOUS | Status: DC

## 2011-11-06 MED ORDER — POTASSIUM CHLORIDE CRYS ER 20 MEQ PO TBCR: 30.0000 meq | EXTENDED_RELEASE_TABLET | ORAL | Status: DC

## 2011-11-06 MED ORDER — POTASSIUM CHLORIDE 10 MEQ/100ML IV SOLN
10.0000 meq | INTRAVENOUS | Status: AC
  Administered 2011-11-06 (×6): 10 meq via INTRAVENOUS
  Filled 2011-11-06 (×6): qty 100

## 2011-11-06 NOTE — Progress Notes (Signed)
DAILY PROGRESS NOTE                              [facility]  SUBJECTIVE: Family at bedside, NGT for suction, denies any nausea or vomiting.  OBJECTIVE: BP 127/73  Pulse 64  Temp(Src) 98 F (36.7 C) (Axillary)  Resp 20  Ht 5\' 3"  (1.6 m)  Wt 52.5 kg (115 lb 11.9 oz)  BMI 20.50 kg/m2  SpO2 94%  Intake/Output Summary (Last 24 hours) at 11/06/11 1031 Last data filed at 11/06/11 0606  Gross per 24 hour  Intake      0 ml  Output   1475 ml  Net  -1475 ml                      Weight change:  Physical Exam: General: Alert and awake oriented x3 not in any acute distress. HEENT: anicteric sclera, pupils equal reactive to light and accommodation CVS: S1-S2 heard, no murmur rubs or gallops Chest: clear to auscultation bilaterally, no wheezing rales or rhonchi Abdomen:  normal bowel sounds, soft, nontender, nondistended, no organomegaly Neuro: Cranial nerves II-XII intact, no focal neurological deficits Extremities: no cyanosis, no clubbing or edema noted bilaterally   Lab Results:  Basename 11/06/11 0440 11/05/11 1651  NA 142 137  K 2.2* 3.5  CL 99 91*  CO2 34* 30  GLUCOSE 97 161*  BUN 22 23  CREATININE 0.84 0.94  CALCIUM 8.9 10.0  MG 1.9 --  PHOS 4.5 --    Basename 11/06/11 0440 11/05/11 1651  AST 15 41*  ALT 8 14  ALKPHOS 61 68  BILITOT 0.6 0.9  PROT 5.4* 7.3  ALBUMIN 3.1* 4.1    Basename 11/05/11 1651  LIPASE 37  AMYLASE --    Basename 11/06/11 0440 11/05/11 1651  WBC 8.9 12.6*  NEUTROABS -- 9.9*  HGB 13.4 16.2*  HCT 40.8 47.6*  MCV 86.8 85.3  PLT 270 352   Micro Results: No results found for this or any previous visit (from the past 240 hour(s)).  Studies/Results: Dg Chest 1 View  11/05/2011  *RADIOLOGY REPORT*  Clinical Data: Nasogastric tube placement.  CHEST - 1 VIEW  Comparison: 11/05/2011 chest radiograph from 5:04 p.m.  Findings: The patient has a gastric pull-through.  The nasogastric tube projects over the  lower thorax in the lower portion of the gastric pull-through.  The intrathoracic stomach has been decompressed, and is significantly reduced in size compared to the prior chest radiograph.  Heart size is within normal limits.  There is mild atelectasis medially in both lung bases.  Tortuous thoracic aorta noted.  IMPRESSION:  1.  Nasogastric tube is in the lower part of the intrathoracic stomach, and has successfully decompressed the gastric pull- through. 2.  Mild atelectasis in both lung bases.  Original Report Authenticated By: Dellia Cloud, M.D.   Dg Chest 2 View  11/05/2011  *RADIOLOGY REPORT*  Clinical Data: Nausea and vomiting.  Esophageal cancer with gastric pull-through 1993  CHEST - 2 VIEW  Comparison: 10/17/2011  Findings: There has been significant change from the recent chest x- ray.  There is a large soft tissue density in the right paratracheal region as well as to the right of the heart.  Given history of gastric pull-through, this is most likely a fluid-filled distended stomach which may be obstructed.  Aneurysm and neoplasm considered unlikely given the time course as well  as a history of prior gastric pull-through.  Cardiac enlargement.  Negative for heart failure.  No pleural effusion.  Probable COPD with hyperinflation of the lungs.  IMPRESSION: There has been any significant change from the  recent chest x-ray. There is a large soft tissue mass in the mediastinum projecting to the right of midline.  This is most likely a fluid filled obstructed stomach in this patient with a gastric pull-through. This could be confirmed with CT scanning.  Original Report Authenticated By: Camelia Phenes, M.D.   Dg Chest 2 View  10/17/2011  *RADIOLOGY REPORT*  Clinical Data: Mid chest pain after eating for approximately 6 weeks, history hypertension, asthma, GERD, esophageal cancer  CHEST - 2 VIEW  Comparison: 12/22/2010  Findings: Enlargement of cardiac silhouette. Calcified tortuous aorta.  Pulmonary vascularity normal. Surgical clips at the cervical region, mid chest and left upper quadrant suspect related to esophagectomy and gastric pull-up. Emphysematous and chronic bronchitic changes. Bibasilar atelectasis versus scarring. No definite acute infiltrate, pleural effusion or pneumothorax. Bones demineralized.  IMPRESSION: Enlargement of cardiac silhouette. Emphysematous and chronic bronchitic changes with bibasilar atelectasis versus scarring.  Original Report Authenticated By: Lollie Marrow, M.D.   Ct Chest Wo Contrast  11/05/2011  *RADIOLOGY REPORT*  Clinical Data: Nausea, vomiting, status post gastric pull-through, rule out mediastinal mass.  CT CHEST WITHOUT CONTRAST  Technique:  Multidetector CT imaging of the chest was performed following the standard protocol without IV contrast.  Comparison: CT chest 11/05/2009 and chest x-ray 11/05/2011.  Findings: Again noted status post esophagectomy and gastric pull- through.  There is a marked gastric distention with fluid which is giving mass appearance on the chest x-ray.  There is mass effect on the heart mediastinal structures.  Findings are highly suspicious for gastric outlet obstruction and fluid retention in the stomach. Atherosclerotic calcifications of thoracic aorta are noted.  There is some mass effect on the trachea and main bronchi which are still patent.  There is no evidence of aspiration pneumonia.  Sagittal images of the spine shows osteopenia and mild degenerative changes thoracic spine.  Degenerative changes of the cervical spine are noted.  IMPRESSION:  1.  There is a status post esophagectomy with gastric pull through. There is marked distention of intrathoracic stomach with fluid. Findings are highly suspicious for gastric outlet obstruction. There is mass effect on the heart and mediastinal structures. 2.  No evidence of aspiration.  No pulmonary edema. 3.  Osteopenia and degenerative changes thoracic spine.  Critical findings  discussed with Dr. Golda Acre from emergency room.  Original Report Authenticated By: Natasha Mead, M.D.   Medications: Scheduled Meds:   . sodium chloride   Intravenous Once  . famotidine (PEPCID) IV  20 mg Intravenous Once  . fentaNYL  50 mcg Intravenous Once  . lidocaine   Other Once  . ondansetron (ZOFRAN) IV  4 mg Intravenous Once  . ondansetron (ZOFRAN) IV  4 mg Intravenous Once  . pantoprazole (PROTONIX) IV  40 mg Intravenous QHS  . potassium chloride  10 mEq Intravenous Q1 Hr x 6  . sodium chloride  1,000 mL Intravenous Once  . sodium chloride  3 mL Intravenous Q12H  . DISCONTD: sodium chloride   Intravenous Once  . DISCONTD: potassium chloride  30 mEq Oral Q4H  . DISCONTD: potassium chloride  10 mEq Intravenous Q1 Hr x 4   Continuous Infusions:  PRN Meds:.acetaminophen, acetaminophen, albuterol, guaiFENesin-dextromethorphan, LORazepam, morphine, ondansetron (ZOFRAN) IV, ondansetron  ASSESSMENT & PLAN: Principal Problem:  *  Gastric outlet obstruction Active Problems:  HYPERTENSION  GERD  HX OF ESOPHAGEAL CANCER  Vomiting  Leukocytosis   Gastric outlet obstruction -Patient has NG tube on low intermittent suction, [facility] aware about the admission. -Felt much better after the NG tube, denies nausea and vomiting. -I will up the PPI to twice a day to try to decrease secretions.  Hypertension -Oral medication will be on hold, we'll use when necessary IV medications.  Hypokalemia -This is likely secondary to the NG tube suction. -Will replace through the IV route. Check magnesium and BMP.  History of esophageal cancer -Status post esophagectomy with stomach pull-through in 1997.   LOS: 1 day   Johanna Stafford A 11/06/2011, 10:31 AM

## 2011-11-06 NOTE — Consult Note (Signed)
Patient seen, examined, and I agree with the above documentation, including the assessment and plan. She gives an excellend hx of symptoms of GOO.  She reports pain and abd distention after eating for the last 2 months.  This pain was only relieved by vomiting. She also reports earlier satiety. Same on presentation yesterday and now all symptoms better with decompression of stomach with 1.5L drained initially. Dr. Larae Grooms recent EGD raised the question of tortuosity in the distal stomach, but the scope did pass. For now, will plan repeat EGD tomorrow to eval GOO.  Perhaps empiric pyloric dilation would help.  This does not sound like intermittent gastric volvulus, because this can been occurring regularly for 2 months. EGD in the am

## 2011-11-06 NOTE — Consult Note (Signed)
[facility] Consultation  Referring Provider: [facility]. Primary Care Physician:  Carrie Mew, MD, MD Primary Gastroenterologist:   Wendall Papa, MD Reason for Consultation:  gastric outlet obstruction  HPI: [name] is a 78 y.o. female has a remote history of esophageal cancer, s/p esophagectomy with gastric pull through. She was seen in the office mid March with intermittent nausea, vomiting, chest pain and recent development of hematemesis. Dr. Christella Hartigan did an EGD 3/20 which showed her  anastomosis to be at 17cm. There was some stenosis at the anastomosis which Dr. Christella Hartigan dilated. She also had gastritis, biopsy negative for h.pylori. Post-procedure patient was started on Carafate and she did well until this past Wed when she developed recurrent postprandial nausea and chest pain. Initially symptoms did not occur with every meals but that changed within a matter of days. CTscan of chest yesterday show marked distention of intrathoracic stomach with fluid suspicious for. gastric outlet obstruction. There is mass effect on the heart and mediastinal structures. Patient has an NGT in and feels 100% better after decompression   Past Medical History  Diagnosis Date  . GERD (gastroesophageal reflux disease)   . Osteoporosis   . Postmenopausal HRT (hormone replacement therapy)   . Esophageal cancer   . Anemia   . Hypertension   . Allergy   . Asthma   . Shortness of breath   . Pneumonia     hx of pneumonia    Past Surgical History  Procedure Date  . Left oophorectomy   . Esophageal removal of cancer,gastric ppull-thru 1993   . Cholecystectomy   . Rt arm fx   . Oophorectomy   . Esophagogastroduodenoscopy 10/19/2011    Procedure: ESOPHAGOGASTRODUODENOSCOPY (EGD);  Surgeon: Rachael Fee, MD;  Location: [facility];  Service: Endoscopy;  Laterality: N/A;  . Balloon dilation 10/19/2011    Procedure: BALLOON DILATION;  Surgeon: Rachael Fee, MD;   Location: [facility];  Service: Endoscopy;  Laterality: N/A;    Prior to Admission medications   Medication Sig Start Date End Date Taking? Authorizing Provider  albuterol (PROVENTIL HFA;VENTOLIN HFA) 108 (90 BASE) MCG/ACT inhaler Inhale 2 puffs into the lungs every 6 (six) hours as needed. For wheezing   Yes Historical Provider, MD  B Complex-C-Min-Fe-FA (HEMOCYTE PLUS) 106-1 MG CAPS Take 106 mg by mouth 2 (two) times daily. 08/31/11  Yes Stacie Glaze, MD  bisoprolol-hydrochlorothiazide [facility]) 2.5-6.25 MG per tablet Take 1 tablet by mouth daily. 05/27/11 05/26/12 Yes Stacie Glaze, MD  Calcium-Vitamin D-Vitamin K (VIACTIV) 500-100-40 MG-UNT-MCG CHEW Chew by mouth 3 (three) times daily.     Yes Historical Provider, MD  lansoprazole (PREVACID SOLUTAB) 30 MG disintegrating tablet Take 2 tablets (60 mg total) by mouth 2 (two) times daily. 07/04/11  Yes Rachael Fee, MD  levocetirizine (XYZAL) 5 MG tablet Take 5 mg by mouth every evening.    Yes Historical Provider, MD  metoCLOPramide (REGLAN) 5 MG tablet Take 1 tablet (5 mg total) by mouth 2 (two) times daily. 08/29/11  Yes Stacie Glaze, MD  promethazine (PHENERGAN) 25 MG suppository Place 25 mg rectally every 6 (six) hours as needed. For nausea   Yes Historical Provider, MD  sucralfate (CARAFATE) 1 GM/10ML suspension Take 10 mLs (1 g total) by mouth 4 (four) times daily. 10/31/11 10/30/12 Yes Stacie Glaze, MD  Vitamin D, Ergocalciferol, (DRISDOL) 50000 UNITS CAPS Take 50,000 Units by mouth every 7 (seven) days. On Friday   Yes Historical Provider, MD  zolpidem (AMBIEN) 5 MG tablet Take 1 tablet (5 mg total) by mouth at bedtime as needed for sleep. 10/31/11 10/30/12 Yes Stacie Glaze, MD    Current Facility-Administered Medications  Medication Dose Route Frequency Provider Last Rate Last Dose  . 0.9 %  sodium chloride infusion   Intravenous Once Therisa Doyne, MD 100 mL/hr at 11/05/11 2328    . acetaminophen (TYLENOL) tablet 650 mg  650 mg  Oral Q6H PRN Therisa Doyne, MD       Or  . acetaminophen (TYLENOL) suppository 650 mg  650 mg Rectal Q6H PRN Therisa Doyne, MD      . albuterol (PROVENTIL) (5 MG/ML) 0.5% nebulizer solution 2.5 mg  2.5 mg Nebulization Q2H PRN Therisa Doyne, MD      . famotidine (PEPCID) IVPB 20 mg  20 mg Intravenous Once Nat Christen, MD   20 mg at 11/05/11 1735  . fentaNYL (SUBLIMAZE) injection 50 mcg  50 mcg Intravenous Once Nat Christen, MD   50 mcg at 11/05/11 1734  . guaiFENesin-dextromethorphan (ROBITUSSIN DM) 100-10 MG/5ML syrup 5 mL  5 mL Oral Q4H PRN Therisa Doyne, MD      . lidocaine (XYLOCAINE) 2 % jelly   Other Once Nat Christen, MD   20 application at 11/05/11 1958  . LORazepam (ATIVAN) injection 0.5 mg  0.5 mg Intravenous Q8H PRN Therisa Doyne, MD   0.5 mg at 11/05/11 2327  . morphine 2 MG/ML injection 2 mg  2 mg Intravenous Q4H PRN Therisa Doyne, MD      . ondansetron (ZOFRAN) injection 4 mg  4 mg Intravenous Once Nat Christen, MD   4 mg at 11/05/11 1726  . ondansetron (ZOFRAN) injection 4 mg  4 mg Intravenous Once Nat Christen, MD   4 mg at 11/05/11 1958  . ondansetron (ZOFRAN) tablet 4 mg  4 mg Oral Q6H PRN Therisa Doyne, MD       Or  . ondansetron (ZOFRAN) injection 4 mg  4 mg Intravenous Q6H PRN Therisa Doyne, MD      . pantoprazole (PROTONIX) injection 40 mg  40 mg Intravenous QHS Therisa Doyne, MD   40 mg at 11/05/11 2348  . phenol (CHLORASEPTIC) mouth spray 1 spray  1 spray Mouth/Throat PRN Clydia Llano, MD   1 spray at 11/06/11 1247  . potassium chloride 10 mEq in 100 mL IVPB  10 mEq Intravenous Q1 Hr x 6 Jinger Neighbors, NP   10 mEq at 11/06/11 1157  . sodium chloride 0.9 % bolus 1,000 mL  1,000 mL Intravenous Once Nat Christen, MD   1,000 mL at 11/05/11 1721  . sodium chloride 0.9 % injection 3 mL  3 mL Intravenous Q12H Therisa Doyne, MD   3 mL at 11/06/11 1032  . DISCONTD: 0.9 %  sodium chloride infusion    Intravenous Once Nat Christen, MD      . DISCONTD: potassium chloride (K-DUR,KLOR-CON) CR tablet 30 mEq  30 mEq Oral Q4H Jinger Neighbors, NP      . DISCONTD: potassium chloride 10 mEq in 100 mL IVPB  10 mEq Intravenous Q1 Hr x 4 Jinger Neighbors, NP        Allergies as of 11/05/2011 - Review Complete 11/05/2011  Allergen Reaction Noted  . Erythromycin ethylsuccinate Other (See Comments) 09/26/2006  . Prednisone Other (See Comments) 09/26/2006    Family History  Problem Relation Age of Onset  . Cancer Mother  cervical  . Heart disease Father   . Coronary artery disease    . Hypotension Sister     History   Social History  . Marital Status: Single    Spouse Name: N/A    Number of Children: 1  . Years of Education: N/A   Occupational History  . Retired    Social History Main Topics  . Smoking status: Never Smoker   . Smokeless tobacco: Never Used  . Alcohol Use: No  . Drug Use: No  . Sexually Active: Yes    Social History Narrative   DAILY CAFFEINE USE    Review of Systems: Positve for weight loss. All other systems reviewed and negative except where noted in HPI.  PHYSICAL EXAM: Vital signs in last 24 hours: Temp:  [97.2 F (36.2 C)-98.3 F (36.8 C)] 98 F (36.7 C) (04/07 1005) Pulse Rate:  [61-101] 64  (04/07 1005) Resp:  [16-24] 20  (04/07 1005) BP: (105-143)/(72-97) 127/73 mmHg (04/07 1005) SpO2:  [93 %-97 %] 94 % (04/07 1005) Weight:  [115 lb 11.9 oz (52.5 kg)-117 lb (53.071 kg)] 115 lb 11.9 oz (52.5 kg) (04/06 2229) Last BM Date: 11/04/11 General:   Pleasant white female in NAD Head:  Normocephalic and atraumatic. Eyes:   No icterus.   Conjunctiva pink. Ears:  Normal auditory acuity. Neck:  Supple; no masses felt Lungs:  Respirations even and unlabored. No wheezes. Difficult to auscultate with background noise from NGT suction.  Heart:  Regular rate and rhythm Abdomen:  Soft, nondistended, nontender. Normal bowel sounds. No appreciable masses or  hepatomegaly.  Rectal:  Not performed.  Msk:  Symmetrical without gross deformities.  Extremities:  Without edema. Neurologic:  Alert and  oriented;  grossly normal neurologically. Skin:  Intact without significant lesions or rashes. Cervical Nodes:  No significant cervical adenopathy. Psych:  Alert and cooperative. Normal affect.  LAB RESULTS:  Basename 11/06/11 0440 11/05/11 1651  WBC 8.9 12.6*  HGB 13.4 16.2*  HCT 40.8 47.6*  PLT 270 352   BMET  Basename 11/06/11 0440 11/05/11 1651  NA 142 137  K 2.2* 3.5  CL 99 91*  CO2 34* 30  GLUCOSE 97 161*  BUN 22 23  CREATININE 0.84 0.94  CALCIUM 8.9 10.0   LFT  Basename 11/06/11 0440  PROT 5.4*  ALBUMIN 3.1*  AST 15  ALT 8  ALKPHOS 61  BILITOT 0.6  BILIDIR --  IBILI --    STUDIES: Dg Chest 1 View  11/05/2011  *RADIOLOGY REPORT*  Clinical Data: Nasogastric tube placement.  CHEST - 1 VIEW  Comparison: 11/05/2011 chest radiograph from 5:04 p.m.  Findings: The patient has a gastric pull-through.  The nasogastric tube projects over the lower thorax in the lower portion of the gastric pull-through.  The intrathoracic stomach has been decompressed, and is significantly reduced in size compared to the prior chest radiograph.  Heart size is within normal limits.  There is mild atelectasis medially in both lung bases.  Tortuous thoracic aorta noted.  IMPRESSION:  1.  Nasogastric tube is in the lower part of the intrathoracic stomach, and has successfully decompressed the gastric pull- through. 2.  Mild atelectasis in both lung bases.  Original Report Authenticated By: Dellia Cloud, M.D.   Dg Chest 2 View  11/05/2011  *RADIOLOGY REPORT*  Clinical Data: Nausea and vomiting.  Esophageal cancer with gastric pull-through 1993  CHEST - 2 VIEW  Comparison: 10/17/2011  Findings: There has been significant change from the recent  chest x- ray.  There is a large soft tissue density in the right paratracheal region as well as to the right of  the heart.  Given history of gastric pull-through, this is most likely a fluid-filled distended stomach which may be obstructed.  Aneurysm and neoplasm considered unlikely given the time course as well as a history of prior gastric pull-through.  Cardiac enlargement.  Negative for heart failure.  No pleural effusion.  Probable COPD with hyperinflation of the lungs.  IMPRESSION: There has been any significant change from the  recent chest x-ray. There is a large soft tissue mass in the mediastinum projecting to the right of midline.  This is most likely a fluid filled obstructed stomach in this patient with a gastric pull-through. This could be confirmed with CT scanning.  Original Report Authenticated By: Camelia Phenes, M.D.   Ct Chest Wo Contrast  11/05/2011  *RADIOLOGY REPORT*  Clinical Data: Nausea, vomiting, status post gastric pull-through, rule out mediastinal mass.  CT CHEST WITHOUT CONTRAST  Technique:  Multidetector CT imaging of the chest was performed following the standard protocol without IV contrast.  Comparison: CT chest 11/05/2009 and chest x-ray 11/05/2011.  Findings: Again noted status post esophagectomy and gastric pull- through.  There is a marked gastric distention with fluid which is giving mass appearance on the chest x-ray.  There is mass effect on the heart mediastinal structures.  Findings are highly suspicious for gastric outlet obstruction and fluid retention in the stomach. Atherosclerotic calcifications of thoracic aorta are noted.  There is some mass effect on the trachea and main bronchi which are still patent.  There is no evidence of aspiration pneumonia.  Sagittal images of the spine shows osteopenia and mild degenerative changes thoracic spine.  Degenerative changes of the cervical spine are noted.  IMPRESSION:  1.  There is a status post esophagectomy with gastric pull through. There is marked distention of intrathoracic stomach with fluid. Findings are highly suspicious for  gastric outlet obstruction. There is mass effect on the heart and mediastinal structures. 2.  No evidence of aspiration.  No pulmonary edema. 3.  Osteopenia and degenerative changes thoracic spine.  Critical findings discussed with Dr. Golda Acre from emergency room.  Original Report Authenticated By: Natasha Mead, M.D.    PREVIOUS ENDOSCOPIES: EGD 3/200/13- see HPI  IMPRESSION / PLAN: 53. 78 year old female with remote history of esophageal cancer, s/p esophagectomy and gastric pull through.Patient had dilation of stenotic anastomosis on 3/20. Following that, patient did well until few days ago when she developed recurrent postprandial chest pain, nausea. CTscan shows marked distention of intrathoracic stomach with fluid c/w gastric outlet obstruction. Of note, patient didn't have any evidence of GOO on 3/20 EGD though distal stomach was noted to be tortuous. Perhaps she is having intermittent gastric volvulus? We probably need to repeat EGD for further evaluation.  2. History of recurrent aspiration PNA.  Thanks   LOS: 1 day   Willette Cluster  11/06/2011, 1:10 PM

## 2011-11-06 NOTE — Progress Notes (Signed)
CRITICAL VALUE ALERT2  Critical value received:  Potassium 2.2  Date of notification:  11/06/11  Time of notification:  0610  Critical value read back:yes  Nurse who received alert:  Macarthur Critchley  MD notified (1st page):  lynch  Time of first page:  0616  MD notified (2nd page):none  Time of second page:0000  Responding MD:  none  Time MD responded:  0000

## 2011-11-06 NOTE — Progress Notes (Signed)
Patient arrived via stretcher to room, denies pain.  Patient made comfortable in bed, oriented to room, placed on telemetry.  NG tube to low intermittent suction was continued with brown, milky substance being removed.  Patient had fall bundle explained and the reasoning she is on bed alarm.  Patient voiced understanding and will call for assistance.  Patient alert and oriented x 4, unsteady gait that requires standby assist.  Patient head of bed to be elevated to 30 degrees because of previous esophageal cancer history and placement of NG tube on this admit.  Patient lives alone and is ambulatory.  Will continue to monitor.  Macarthur Critchley, RN

## 2011-11-07 ENCOUNTER — Encounter (HOSPITAL_COMMUNITY)
Admission: EM | Disposition: A | Discharge status: Home / Self Care | Payer: Self-pay | Source: Home / Self Care | Attending: Internal Medicine

## 2011-11-07 ENCOUNTER — Encounter (HOSPITAL_COMMUNITY): Payer: Self-pay | Admitting: *Deleted

## 2011-11-07 DIAGNOSIS — R112 Nausea with vomiting, unspecified: Secondary | ICD-10-CM

## 2011-11-07 DIAGNOSIS — R933 Abnormal findings on diagnostic imaging of other parts of digestive tract: Secondary | ICD-10-CM

## 2011-11-07 HISTORY — PX: ESOPHAGOGASTRODUODENOSCOPY: SHX5428

## 2011-11-07 LAB — BASIC METABOLIC PANEL
BUN: 19 mg/dL (ref 6–23)
Calcium: 8.7 mg/dL (ref 8.4–10.5)
GFR calc Af Amer: 89 mL/min — ABNORMAL LOW (ref >90)
GFR calc non Af Amer: 77 mL/min — ABNORMAL LOW (ref >90)
Glucose, Bld: 112 mg/dL — ABNORMAL HIGH (ref 70–99)
Potassium: 2.8 mEq/L — ABNORMAL LOW (ref 3.5–5.1)
Sodium: 142 mEq/L (ref 135–145)

## 2011-11-07 LAB — CBC
Hemoglobin: 12.8 g/dL (ref 12.0–15.0)
MCH: 28.6 pg (ref 26.0–34.0)
MCHC: 32.3 g/dL (ref 30.0–36.0)
Platelets: 227 10*3/uL (ref 150–400)
RDW: 14.5 % (ref 11.5–15.5)

## 2011-11-07 SURGERY — EGD (ESOPHAGOGASTRODUODENOSCOPY)
Anesthesia: Moderate Sedation

## 2011-11-07 MED ORDER — FENTANYL CITRATE 0.05 MG/ML IJ SOLN
INTRAMUSCULAR | Status: AC
  Filled 2011-11-07: qty 2

## 2011-11-07 MED ORDER — BUTAMBEN-TETRACAINE-BENZOCAINE 2-2-14 % EX AERO
INHALATION_SPRAY | CUTANEOUS | Status: DC | PRN
  Administered 2011-11-07: 2 via TOPICAL

## 2011-11-07 MED ORDER — MIDAZOLAM HCL 10 MG/2ML IJ SOLN
INTRAMUSCULAR | Status: DC | PRN
  Administered 2011-11-07 (×3): 1 mg via INTRAVENOUS

## 2011-11-07 MED ORDER — MIDAZOLAM HCL 10 MG/2ML IJ SOLN
INTRAMUSCULAR | Status: AC
  Filled 2011-11-07: qty 2

## 2011-11-07 MED ORDER — POTASSIUM CHLORIDE 10 MEQ/100ML IV SOLN
10.0000 meq | INTRAVENOUS | Status: AC
  Administered 2011-11-07 (×5): 10 meq via INTRAVENOUS
  Filled 2011-11-07 (×6): qty 100

## 2011-11-07 MED ORDER — SODIUM CHLORIDE 0.9 % IV SOLN: Freq: Once | INTRAVENOUS | Status: DC

## 2011-11-07 MED ORDER — FENTANYL NICU IV SYRINGE 50 MCG/ML
INJECTION | INTRAMUSCULAR | Status: DC | PRN
  Administered 2011-11-07 (×2): 25 ug via INTRAVENOUS

## 2011-11-07 NOTE — Interval H&P Note (Signed)
History and Physical Interval Note:  11/07/2011 12:16 PM  [name]  has presented today for surgery, with the diagnosis of gastric outlet obstruction  The various methods of treatment have been discussed with the patient and family. After consideration of risks, benefits and other options for treatment, the patient has consented to  Procedure(s) (LRB): ESOPHAGOGASTRODUODENOSCOPY (EGD) (N/A) as a surgical intervention .  The patients' history has been reviewed, patient examined, no change in status, stable for surgery.  I have reviewed the patients' chart and labs.  Questions were answered to the patient's satisfaction.     Venita Lick. Russella Dar MD [facility]

## 2011-11-07 NOTE — Progress Notes (Signed)
DAILY PROGRESS NOTE                              GENERAL INTERNAL MEDICINE [facility]  SUBJECTIVE: Family at bedside, NGT for suction, denies any nausea or vomiting.  OBJECTIVE: BP 121/78  Pulse 47  Temp(Src) 97.7 F (36.5 C) (Oral)  Resp 17  Ht 5\' 3"  (1.6 m)  Wt 52.5 kg (115 lb 11.9 oz)  BMI 20.50 kg/m2  SpO2 93%  Intake/Output Summary (Last 24 hours) at 11/07/11 1037 Last data filed at 11/07/11 0548  Gross per 24 hour  Intake 2103.34 ml  Output    750 ml  Net 1353.34 ml                      Weight change:  Physical Exam: General: Alert and awake oriented x3 not in any acute distress. HEENT: anicteric sclera, pupils equal reactive to light and accommodation CVS: S1-S2 heard, no murmur rubs or gallops Chest: clear to auscultation bilaterally, no wheezing rales or rhonchi Abdomen:  normal bowel sounds, soft, nontender, nondistended, no organomegaly Neuro: Cranial nerves II-XII intact, no focal neurological deficits Extremities: no cyanosis, no clubbing or edema noted bilaterally   Lab Results:  Basename 11/07/11 0605 11/06/11 1240 11/06/11 0440  NA 142 -- 142  K 2.8* 3.1* --  CL 103 -- 99  CO2 31 -- 34*  GLUCOSE 112* -- 97  BUN 19 -- 22  CREATININE 0.78 -- 0.84  CALCIUM 8.7 -- 8.9  MG -- -- 1.9  PHOS -- -- 4.5    Basename 11/06/11 0440 11/05/11 1651  AST 15 41*  ALT 8 14  ALKPHOS 61 68  BILITOT 0.6 0.9  PROT 5.4* 7.3  ALBUMIN 3.1* 4.1    Basename 11/05/11 1651  LIPASE 37  AMYLASE --    Basename 11/07/11 0605 11/06/11 0440 11/05/11 1651  WBC 5.8 8.9 --  NEUTROABS -- -- 9.9*  HGB 12.8 13.4 --  HCT 39.6 40.8 --  MCV 88.4 86.8 --  PLT 227 270 --   Micro Results: No results found for this or any previous visit (from the past 240 hour(s)).  Studies/Results: Dg Chest 1 View  11/05/2011  *RADIOLOGY REPORT*  Clinical Data: Nasogastric tube placement.  CHEST - 1 VIEW  Comparison: 11/05/2011 chest radiograph from 5:04 p.m.  Findings: The patient  has a gastric pull-through.  The nasogastric tube projects over the lower thorax in the lower portion of the gastric pull-through.  The intrathoracic stomach has been decompressed, and is significantly reduced in size compared to the prior chest radiograph.  Heart size is within normal limits.  There is mild atelectasis medially in both lung bases.  Tortuous thoracic aorta noted.  IMPRESSION:  1.  Nasogastric tube is in the lower part of the intrathoracic stomach, and has successfully decompressed the gastric pull- through. 2.  Mild atelectasis in both lung bases.  Original Report Authenticated By: Dellia Cloud, M.D.   Dg Chest 2 View  11/05/2011  *RADIOLOGY REPORT*  Clinical Data: Nausea and vomiting.  Esophageal cancer with gastric pull-through 1993  CHEST - 2 VIEW  Comparison: 10/17/2011  Findings: There has been significant change from the recent chest x- ray.  There is a large soft tissue density in the right paratracheal region as well as to the right of the heart.  Given history of gastric pull-through, this is most likely a fluid-filled distended stomach which  may be obstructed.  Aneurysm and neoplasm considered unlikely given the time course as well as a history of prior gastric pull-through.  Cardiac enlargement.  Negative for heart failure.  No pleural effusion.  Probable COPD with hyperinflation of the lungs.  IMPRESSION: There has been any significant change from the  recent chest x-ray. There is a large soft tissue mass in the mediastinum projecting to the right of midline.  This is most likely a fluid filled obstructed stomach in this patient with a gastric pull-through. This could be confirmed with CT scanning.  Original Report Authenticated By: Camelia Phenes, M.D.   Dg Chest 2 View  10/17/2011  *RADIOLOGY REPORT*  Clinical Data: Mid chest pain after eating for approximately 6 weeks, history hypertension, asthma, GERD, esophageal cancer  CHEST - 2 VIEW  Comparison: 12/22/2010  Findings:  Enlargement of cardiac silhouette. Calcified tortuous aorta. Pulmonary vascularity normal. Surgical clips at the cervical region, mid chest and left upper quadrant suspect related to esophagectomy and gastric pull-up. Emphysematous and chronic bronchitic changes. Bibasilar atelectasis versus scarring. No definite acute infiltrate, pleural effusion or pneumothorax. Bones demineralized.  IMPRESSION: Enlargement of cardiac silhouette. Emphysematous and chronic bronchitic changes with bibasilar atelectasis versus scarring.  Original Report Authenticated By: Lollie Marrow, M.D.   Ct Chest Wo Contrast  11/05/2011  *RADIOLOGY REPORT*  Clinical Data: Nausea, vomiting, status post gastric pull-through, rule out mediastinal mass.  CT CHEST WITHOUT CONTRAST  Technique:  Multidetector CT imaging of the chest was performed following the standard protocol without IV contrast.  Comparison: CT chest 11/05/2009 and chest x-ray 11/05/2011.  Findings: Again noted status post esophagectomy and gastric pull- through.  There is a marked gastric distention with fluid which is giving mass appearance on the chest x-ray.  There is mass effect on the heart mediastinal structures.  Findings are highly suspicious for gastric outlet obstruction and fluid retention in the stomach. Atherosclerotic calcifications of thoracic aorta are noted.  There is some mass effect on the trachea and main bronchi which are still patent.  There is no evidence of aspiration pneumonia.  Sagittal images of the spine shows osteopenia and mild degenerative changes thoracic spine.  Degenerative changes of the cervical spine are noted.  IMPRESSION:  1.  There is a status post esophagectomy with gastric pull through. There is marked distention of intrathoracic stomach with fluid. Findings are highly suspicious for gastric outlet obstruction. There is mass effect on the heart and mediastinal structures. 2.  No evidence of aspiration.  No pulmonary edema. 3.  Osteopenia  and degenerative changes thoracic spine.  Critical findings discussed with Dr. Golda Acre from emergency room.  Original Report Authenticated By: Natasha Mead, M.D.   Medications: Scheduled Meds:    . pantoprazole (PROTONIX) IV  40 mg Intravenous QHS  . potassium chloride  10 mEq Intravenous Q1 Hr x 6  . potassium chloride  10 mEq Intravenous Q1 Hr x 6  . sodium chloride  3 mL Intravenous Q12H   Continuous Infusions:    . dextrose 5 % and 0.9% NaCl 100 mL/hr at 11/06/11 1547   PRN Meds:.acetaminophen, acetaminophen, albuterol, guaiFENesin-dextromethorphan, LORazepam, morphine, ondansetron (ZOFRAN) IV, ondansetron, phenol  ASSESSMENT & PLAN: Principal Problem:  *Gastric outlet obstruction Active Problems:  HYPERTENSION  GERD  HX OF ESOPHAGEAL CANCER  Vomiting  Leukocytosis   Gastric outlet obstruction -Patient has NG tube on low intermittent suction, her  gastrology note EGD today. -Felt much better after the NG tube, denies nausea and vomiting. -I  will up the PPI to twice a day to try to decrease secretions.  Hypertension -Oral medication will be on hold, we'll use when necessary IV medications.  Hypokalemia -This is likely secondary to the NG tube suction. -Will replace through the IV route. Check magnesium and BMP.  History of esophageal cancer -Status post esophagectomy with stomach pull-through in 1997.   LOS: 2 days   Printice Hellmer A 11/07/2011, 10:37 AM

## 2011-11-07 NOTE — Op Note (Signed)
[facility] [address] [place], [place]  [address]  ENDOSCOPY PROCEDURE REPORT  PATIENT:  [name], [name]  MR#:  [id number] BIRTHDATE:  12/25/1928, 82 yrs. old  GENDER:  female ENDOSCOPIST:  Judie Petit T. Russella Dar, MD, [facility]  PROCEDURE DATE:  11/07/2011 PROCEDURE:  EGD, diagnostic 43235 ASA CLASS:  Class III INDICATIONS:  abnormal imaging, nausea and vomiting, possbile GOO  MEDICATIONS:   These medications were titrated to patient response per physician's verbal order, Fentanyl 50 mcg IV, Versed 4 mg IV TOPICAL ANESTHETIC:  Cetacaine Spray DESCRIPTION OF PROCEDURE:   After the risks benefits and alternatives of the procedure were thoroughly explained, informed consent was obtained.  The [id number] ([id number]) endoscope was introduced through the mouth and advanced to the third portion of the duodenum, without limitations.  The instrument was slowly withdrawn as the mucosa was fully examined. <<PROCEDUREIMAGES>> Anastomotic changes were noted in the proximal esophagus. Slight narrowing at the ephagogastric junction at 18 cm. Moderate gastritis was found in the body of the stomach which appeared to be NGT related.  The distal stomach was mild tortuous or deformed but the pylorus was widey patent-possible post sugical change. Otherwise normal stomach.  The duodenal bulb was normal in appearance, except for a slight narrowing but no obstruction-possible extrinsic effect or post surgical change, as was the postbulbar and third duodenum.  Retroflexed views revealed no abnormalities.   The scope was then withdrawn from the patient and the procedure completed. The NGT was removed.  COMPLICATIONS:  None  ENDOSCOPIC IMPRESSION: 1) Anastomosis in the proximal esophagus 2) Moderate gastritis in the body of the stomach-likely NGT related 3) No GOO noted  RECOMMENDATIONS: 1) OP follow-up in 2 weeks  with Dr. Christella Hartigan 2) UGI series to assess anatomy and  emptying  Eliya Bubar T. Russella Dar, MD, [facility]  CC:  Rob Bunting, MD  n. Rosalie DoctorVenita Lick. Hudsyn Barich at 11/07/2011 12:51 PM  Candie Chroman, [phone]

## 2011-11-08 ENCOUNTER — Encounter (HOSPITAL_COMMUNITY): Payer: Self-pay | Admitting: Gastroenterology

## 2011-11-08 ENCOUNTER — Inpatient Hospital Stay (HOSPITAL_COMMUNITY): Payer: Medicare Other

## 2011-11-08 ENCOUNTER — Other Ambulatory Visit: Payer: Self-pay | Admitting: *Deleted

## 2011-11-08 LAB — BASIC METABOLIC PANEL
BUN: 7 mg/dL (ref 6–23)
CO2: 28 mEq/L (ref 19–32)
Calcium: 8.7 mg/dL (ref 8.4–10.5)
GFR calc non Af Amer: 81 mL/min — ABNORMAL LOW (ref >90)
Glucose, Bld: 112 mg/dL — ABNORMAL HIGH (ref 70–99)

## 2011-11-08 MED ORDER — BOOST / RESOURCE BREEZE PO LIQD
1.0000 | Freq: Two times a day (BID) | ORAL | Status: DC
  Administered 2011-11-08 – 2011-11-10 (×3): 1 via ORAL

## 2011-11-08 MED ORDER — METOCLOPRAMIDE HCL 5 MG PO TABS
5.0000 mg | ORAL_TABLET | Freq: Two times a day (BID) | ORAL | Status: DC
  Administered 2011-11-09 – 2011-11-10 (×4): 5 mg via ORAL
  Filled 2011-11-08 (×5): qty 1

## 2011-11-08 MED ORDER — PANTOPRAZOLE SODIUM 40 MG PO TBEC
40.0000 mg | DELAYED_RELEASE_TABLET | Freq: Every day | ORAL | Status: DC
  Administered 2011-11-09 – 2011-11-10 (×2): 40 mg via ORAL
  Filled 2011-11-08 (×2): qty 1

## 2011-11-08 MED ORDER — METOCLOPRAMIDE HCL 5 MG PO TABS
5.0000 mg | ORAL_TABLET | Freq: Three times a day (TID) | ORAL | Status: DC
  Filled 2011-11-08 (×3): qty 1

## 2011-11-08 NOTE — Progress Notes (Signed)
   CARE MANAGEMENT NOTE 11/08/2011  Patient:  [name], [name]   Account Number:  [id number]  Date Initiated:  11/08/2011  Documentation initiated by:  Letha Cape  Subjective/Objective Assessment:   dx gastric outlet obstruction  admit- lives alone.  PTA independent.     Action/Plan:   pt/ot eval.   Anticipated DC Date:  11/09/2011   Anticipated DC Plan:  HOME W HOME HEALTH SERVICES      DC Planning Services  CM consult      Choice offered to / List presented to:             Status of service:  In process, will continue to follow Medicare Important Message given?   (If response is "NO", the following Medicare IM given date fields will be blank) Date Medicare IM given:   Date Additional Medicare IM given:    Discharge Disposition:    Per UR Regulation:    If discussed at Long Length of Stay Meetings, dates discussed:    Comments:  11/08/11 17:21 Letha Cape RN, BSN [phone] patient lives alone, pta independent.  Await pt/ot eval. Pateint has medication coverage and transportation.

## 2011-11-08 NOTE — Evaluation (Signed)
Physical Therapy Evaluation Patient Details Name: ANAVICTORIA WILK MRN: [id number] DOB: April 18, 1929 Today's Date: 11/08/2011  Problem List:  Patient Active Problem List  Diagnoses  . B12 DEFICIENCY  . ANEMIA-IRON DEFICIENCY  . HYPERTENSION  . ALLERGIC RHINITIS  . ASPIRATION PNEUMONIA, RIGHT LOWER LOBE  . GERD  . OSTEOPOROSIS  . COUGH  . HX OF ESOPHAGEAL CANCER  . PERSONAL HISTORY OF COLONIC POLYPS  . INSOMNIA  . Asthma with bronchitis  . Unspecified gastritis and gastroduodenitis without mention of hemorrhage  . Nonspecific (abnormal) findings on radiological and other examination of gastrointestinal tract  . Vomiting  . Dysphagia  . Esophageal stricture  . Gastric outlet obstruction  . Leukocytosis    Past Medical History:  Past Medical History  Diagnosis Date  . GERD (gastroesophageal reflux disease)   . Osteoporosis   . Postmenopausal HRT (hormone replacement therapy)   . Esophageal cancer   . Anemia   . Hypertension   . Allergy   . Asthma   . Shortness of breath   . Pneumonia     hx of pneumonia   Past Surgical History:  Past Surgical History  Procedure Date  . Left oophorectomy   . Esophageal removal of cancer,gastric ppull-thru 1993   . Cholecystectomy   . Rt arm fx   . Oophorectomy   . Esophagogastroduodenoscopy 10/19/2011    Procedure: ESOPHAGOGASTRODUODENOSCOPY (EGD);  Surgeon: Rachael Fee, MD;  Location: [facility];  Service: Endoscopy;  Laterality: N/A;  . Balloon dilation 10/19/2011    Procedure: BALLOON DILATION;  Surgeon: Rachael Fee, MD;  Location: [facility] ENDOSCOPY;  Service: Endoscopy;  Laterality: N/A;  . Esophagogastroduodenoscopy 11/07/2011    Procedure: ESOPHAGOGASTRODUODENOSCOPY (EGD);  Surgeon: Meryl Dare, MD,FACG;  Location: [facility] ENDOSCOPY;  Service: Endoscopy;  Laterality: N/A;    PT Assessment/Plan/Recommendation PT Assessment Clinical Impression Statement: Pt adm with nausea/vomiting due to gastric outler syndrome s/p  surgical intervention.  Presently, pt is at or approaching her baseline function and is at an independent level in a home-like environment.  No further acute or PT follow up. PT Recommendation/Assessment: Patent does not need any further PT services No Skilled PT: Patient at baseline level of functioning PT Recommendation Follow Up Recommendations: No PT follow up Equipment Recommended: None recommended by PT PT Goals     PT Evaluation Precautions/Restrictions  Restrictions Weight Bearing Restrictions: No Prior Functioning  Home Living Lives With: Alone Type of Home: House Home Layout: One level Home Adaptive Equipment: None Prior Function Level of Independence: Independent with basic ADLs;Independent with homemaking with ambulation;Independent with gait;Independent with transfers Able to Take Stairs?: Yes Cognition Cognition Arousal/Alertness: Awake/alert Overall Cognitive Status: Appears within functional limits for tasks assessed Orientation Level: Oriented X4 Sensation/Coordination Sensation Light Touch: Appears Intact Coordination Gross Motor Movements are Fluid and Coordinated: Yes Fine Motor Movements are Fluid and Coordinated: Yes Extremity Assessment RUE Assessment RUE Assessment: Within Functional Limits LUE Assessment LUE Assessment: Within Functional Limits RLE Assessment RLE Assessment: Within Functional Limits LLE Assessment LLE Assessment: Within Functional Limits Mobility (including Balance) Bed Mobility Bed Mobility: Yes Supine to Sit: 7: Independent Transfers Transfers: Yes Sit to Stand: 7: Independent Stand to Sit: 7: Independent Ambulation/Gait Ambulation/Gait: Yes Ambulation/Gait Assistance: 7: Independent Ambulation Distance (Feet): 400 Feet Assistive device: None Gait Pattern: Within Functional Limits Stairs: Yes Stairs Assistance: 6: Modified independent (Device/Increase time) Stair Management Technique: One rail Right Number of  Stairs: 4  Wheelchair Mobility Wheelchair Mobility: No  Posture/Postural Control Posture/Postural Control: No significant limitations  Balance Balance Assessed: Yes High Level Balance High Level Balance Activites: Backward walking;Direction changes;Turns;Head turns;Sudden stops;Other (comment) (scanning) Exercise    End of Session PT - End of Session Activity Tolerance: Patient tolerated treatment well Patient left: in bed General Behavior During Session: [facility] for tasks performed Cognition: [facility] for tasks performed  Allean Montfort, Eliseo Gum 11/08/2011, 4:55 PM  11/08/2011  [place], PT [phone] (pager)

## 2011-11-08 NOTE — Progress Notes (Addendum)
DAILY PROGRESS NOTE                              GENERAL [facility]  SUBJECTIVE: Denies any nausea or vomiting. NG tube is out.  OBJECTIVE: BP 129/84  Pulse 70  Temp(Src) 98.5 F (36.9 C) (Oral)  Resp 18  Ht 5\' 3"  (1.6 m)  Wt 52.5 kg (115 lb 11.9 oz)  BMI 20.50 kg/m2  SpO2 94%  Intake/Output Summary (Last 24 hours) at 11/08/11 1207 Last data filed at 11/08/11 1610  Gross per 24 hour  Intake 3959.67 ml  Output      4 ml  Net 3955.67 ml                      Weight change:  Physical Exam: General: Alert and awake oriented x3 not in any acute distress. HEENT: anicteric sclera, pupils equal reactive to light and accommodation CVS: S1-S2 heard, no murmur rubs or gallops Chest: clear to auscultation bilaterally, no wheezing rales or rhonchi Abdomen:  normal bowel sounds, soft, nontender, nondistended, no organomegaly Neuro: Cranial nerves II-XII intact, no focal neurological deficits Extremities: no cyanosis, no clubbing or edema noted bilaterally   Lab Results:  Basename 11/07/11 0605 11/06/11 1240 11/06/11 0440  NA 142 -- 142  K 2.8* 3.1* --  CL 103 -- 99  CO2 31 -- 34*  GLUCOSE 112* -- 97  BUN 19 -- 22  CREATININE 0.78 -- 0.84  CALCIUM 8.7 -- 8.9  MG -- -- 1.9  PHOS -- -- 4.5    Basename 11/06/11 0440 11/05/11 1651  AST 15 41*  ALT 8 14  ALKPHOS 61 68  BILITOT 0.6 0.9  PROT 5.4* 7.3  ALBUMIN 3.1* 4.1    Basename 11/05/11 1651  LIPASE 37  AMYLASE --    Basename 11/07/11 0605 11/06/11 0440 11/05/11 1651  WBC 5.8 8.9 --  NEUTROABS -- -- 9.9*  HGB 12.8 13.4 --  HCT 39.6 40.8 --  MCV 88.4 86.8 --  PLT 227 270 --   Micro Results: No results found for this or any previous visit (from the past 240 hour(s)).  Studies/Results: Dg Chest 1 View  11/05/2011  *RADIOLOGY REPORT*  Clinical Data: Nasogastric tube placement.  CHEST - 1 VIEW  Comparison: 11/05/2011 chest radiograph from 5:04 p.m.  Findings: The patient has a gastric  pull-through.  The nasogastric tube projects over the lower thorax in the lower portion of the gastric pull-through.  The intrathoracic stomach has been decompressed, and is significantly reduced in size compared to the prior chest radiograph.  Heart size is within normal limits.  There is mild atelectasis medially in both lung bases.  Tortuous thoracic aorta noted.  IMPRESSION:  1.  Nasogastric tube is in the lower part of the intrathoracic stomach, and has successfully decompressed the gastric pull- through. 2.  Mild atelectasis in both lung bases.  Original Report Authenticated By: Dellia Cloud, M.D.   Dg Chest 2 View  11/05/2011  *RADIOLOGY REPORT*  Clinical Data: Nausea and vomiting.  Esophageal cancer with gastric pull-through 1993  CHEST - 2 VIEW  Comparison: 10/17/2011  Findings: There has been significant change from the recent chest x- ray.  There is a large soft tissue density in the right paratracheal region as well as to the right of the heart.  Given history of gastric pull-through, this is most likely a fluid-filled distended stomach which  may be obstructed.  Aneurysm and neoplasm considered unlikely given the time course as well as a history of prior gastric pull-through.  Cardiac enlargement.  Negative for heart failure.  No pleural effusion.  Probable COPD with hyperinflation of the lungs.  IMPRESSION: There has been any significant change from the  recent chest x-ray. There is a large soft tissue mass in the mediastinum projecting to the right of midline.  This is most likely a fluid filled obstructed stomach in this patient with a gastric pull-through. This could be confirmed with CT scanning.  Original Report Authenticated By: Camelia Phenes, M.D.   Dg Chest 2 View  10/17/2011  *RADIOLOGY REPORT*  Clinical Data: Mid chest pain after eating for approximately 6 weeks, history hypertension, asthma, GERD, esophageal cancer  CHEST - 2 VIEW  Comparison: 12/22/2010  Findings: Enlargement  of cardiac silhouette. Calcified tortuous aorta. Pulmonary vascularity normal. Surgical clips at the cervical region, mid chest and left upper quadrant suspect related to esophagectomy and gastric pull-up. Emphysematous and chronic bronchitic changes. Bibasilar atelectasis versus scarring. No definite acute infiltrate, pleural effusion or pneumothorax. Bones demineralized.  IMPRESSION: Enlargement of cardiac silhouette. Emphysematous and chronic bronchitic changes with bibasilar atelectasis versus scarring.  Original Report Authenticated By: Lollie Marrow, M.D.   Ct Chest Wo Contrast  11/05/2011  *RADIOLOGY REPORT*  Clinical Data: Nausea, vomiting, status post gastric pull-through, rule out mediastinal mass.  CT CHEST WITHOUT CONTRAST  Technique:  Multidetector CT imaging of the chest was performed following the standard protocol without IV contrast.  Comparison: CT chest 11/05/2009 and chest x-ray 11/05/2011.  Findings: Again noted status post esophagectomy and gastric pull- through.  There is a marked gastric distention with fluid which is giving mass appearance on the chest x-ray.  There is mass effect on the heart mediastinal structures.  Findings are highly suspicious for gastric outlet obstruction and fluid retention in the stomach. Atherosclerotic calcifications of thoracic aorta are noted.  There is some mass effect on the trachea and main bronchi which are still patent.  There is no evidence of aspiration pneumonia.  Sagittal images of the spine shows osteopenia and mild degenerative changes thoracic spine.  Degenerative changes of the cervical spine are noted.  IMPRESSION:  1.  There is a status post esophagectomy with gastric pull through. There is marked distention of intrathoracic stomach with fluid. Findings are highly suspicious for gastric outlet obstruction. There is mass effect on the heart and mediastinal structures. 2.  No evidence of aspiration.  No pulmonary edema. 3.  Osteopenia and  degenerative changes thoracic spine.  Critical findings discussed with Dr. Golda Acre from emergency room.  Original Report Authenticated By: Natasha Mead, M.D.   Medications: Scheduled Meds:    . metoCLOPramide  5 mg Oral BID WC  . pantoprazole  40 mg Oral Q0600  . potassium chloride  10 mEq Intravenous Q1 Hr x 6  . sodium chloride  3 mL Intravenous Q12H  . DISCONTD: sodium chloride   Intravenous Once  . DISCONTD: metoCLOPramide  5 mg Oral TID AC  . DISCONTD: pantoprazole (PROTONIX) IV  40 mg Intravenous QHS   Continuous Infusions:    . dextrose 5 % and 0.9% NaCl 100 mL/hr at 11/08/11 1044   PRN Meds:.acetaminophen, acetaminophen, albuterol, guaiFENesin-dextromethorphan, LORazepam, morphine, ondansetron (ZOFRAN) IV, ondansetron, phenol, DISCONTD: butamben-tetracaine-benzocaine, DISCONTD: fentaNYL, DISCONTD: midazolam  ASSESSMENT & PLAN: Principal Problem:  *Gastric outlet obstruction Active Problems:  HYPERTENSION  GERD  HX OF ESOPHAGEAL CANCER  Vomiting  Leukocytosis  Gastric outlet obstruction -EGD showed no gastric outlet obstruction, upper GI series this morning showed delayed gastric emptying. -Patient is on Reglan at home, she takes it twice a day it she only eats twice. -Continue Reglan, advance diet, if patient tolerates she can probably discharge in the morning.  Hypertension -Oral medication will be on hold, we'll use when necessary IV medications.  Hypokalemia -This is likely secondary to the NG tube suction. -Will recheck a BMP, replete as needed.  History of esophageal cancer -Status post esophagectomy with stomach pull-through in 1997.  Disposition -Family is wondering about her functional status as she stays alone at home. PT/OT to see her.   LOS: 3 days   Marchello Rothgeb A 11/08/2011, 12:07 PM

## 2011-11-08 NOTE — Progress Notes (Signed)
I have taken an interval history, reviewed the chart and examined the patient. UGI series finding c/w gastroparesis. No need for GES at this time. Dietary modifications for gastroparesis and GERD discussed with patient. Proceed with nutrition consult. Promotility agents could help however she does not tolerate higher doses of metoclopramide. Consider domperidone as an outpatient, defer to Dr. Christella Hartigan. DC Carafate. Continue PPI. Should be ready for discharge soon-maybe tomorrow. I agree with the extender's note, impression and recommendations.   [name]. Russella Dar MD Clementeen Graham

## 2011-11-08 NOTE — Progress Notes (Signed)
     [facility] Daily Rounding Note 11/08/2011, 12:25 PM  SUBJECTIVE:       Loose stools following the UGI series this AM.  No problems completing the study.  No further abdominal pain.  OBJECTIVE:        General: Looks well     Vital signs in last 24 hours:    Temp:  [97.6 F (36.4 C)-98.5 F (36.9 C)] 98.5 F (36.9 C) (04/09 0540) Pulse Rate:  [56-70] 70  (04/09 0540) Resp:  [14-26] 18  (04/09 0540) BP: (93-142)/(52-84) 129/84 mmHg (04/09 0540) SpO2:  [94 %-99 %] 94 % (04/09 0540) Last BM Date: 11/07/11  Heart: RRR Chest: clear B.  Breathing easily Abdomen: soft, NT, ND, active BS.  No masses  Extremities: no pedal edema Neuro/Psych:  Pleasant, relaxed, not confused.   Intake/Output from previous day: 04/08 0701 - 04/09 0700 In: 3959.7 [P.O.:360; I.V.:3599.7] Out: 4 [Urine:3; Stool:1]  Intake/Output this shift:    Lab Results:  Basename 11/07/11 0605 11/06/11 0440 11/05/11 1651  WBC 5.8 8.9 12.6*  HGB 12.8 13.4 16.2*  HCT 39.6 40.8 47.6*  PLT 227 270 352   BMET  Basename 11/07/11 0605 11/06/11 1240 11/06/11 0440 11/05/11 1651  NA 142 -- 142 137  K 2.8* 3.1* 2.2* --  CL 103 -- 99 91*  CO2 31 -- 34* 30  GLUCOSE 112* -- 97 161*  BUN 19 -- 22 23  CREATININE 0.78 -- 0.84 0.94  CALCIUM 8.7 -- 8.9 10.0   LFT  Basename 11/06/11 0440 11/05/11 1651  PROT 5.4* 7.3  ALBUMIN 3.1* 4.1  AST 15 41*  ALT 8 14  ALKPHOS 61 68  BILITOT 0.6 0.9  BILIDIR -- --  IBILI -- --   Studies/Results: UGI series 11/08/2011:  . IMPRESSION:  1. Slow procession of barium from the intrathoracic stomach into  the small bowel. The only potential narrowing visualized was at  the junction of the stomach and small bowel with the hiatus, but  even that narrowing was at least 13 mm in diameter. If clinically  warranted, quantification of the gastric emptying could be  performed using a nuclear medicine gastric emptying test.     ASSESMENT: 1.  GOO.  EGD of 11/07/11 with gastritis,  patent gastro-proximalesophageal anastomosis and no outlet obstruction.  UGI series confirms slow gastric emptying, no physical obstruction.  At home pt has been on 5 mg of Reglan with meals, which is twice daily as she does not eat a 3rd meal.  She reports she has developed a tremor from this.  She developed confusion/delerium when taking erythromycin for a lung infection in the past. 2.  1993 esophagectomy and gastric pull through to treat esophageal cancer.  3.  Hypokalemia.  Got 6 runs of potassium yesterday, had problems tolerating this.  No BMET this AM.  PLAN: 1.  Leave Reglan at BID for now.  Should not continue carafate at discharge.  Daily po Protonix.  2.  Nutrition consult to help her plan meals/po intake.  3.  BMET pending for this afternoon. 4.  Has 12/02/11 ROV with Dr Christella Hartigan.     LOS: 3 days   Jennye Moccasin  11/08/2011, 12:25 PM Pager: [phone]

## 2011-11-08 NOTE — Progress Notes (Signed)
Pt was removed from telemetry at 0510. Pt was running NSR on the monitor. Pt was asymptomatic and remained NSR while on monitor.

## 2011-11-08 NOTE — Progress Notes (Signed)
INITIAL ADULT NUTRITION ASSESSMENT Date: 11/08/2011   Time: 12:50 PM Reason for Assessment: Consult  ASSESSMENT: Female 78 y.o.  Dx: Gastroparesis  Hx:  Past Medical History  Diagnosis Date  . GERD (gastroesophageal reflux disease)   . Osteoporosis   . Postmenopausal HRT (hormone replacement therapy)   . Esophageal cancer   . Anemia   . Hypertension   . Allergy   . Asthma   . Shortness of breath   . Pneumonia     hx of pneumonia    Related Meds:     . metoCLOPramide  5 mg Oral BID WC  . pantoprazole  40 mg Oral Q0600  . potassium chloride  10 mEq Intravenous Q1 Hr x 6  . sodium chloride  3 mL Intravenous Q12H  . DISCONTD: sodium chloride   Intravenous Once  . DISCONTD: metoCLOPramide  5 mg Oral TID AC  . DISCONTD: pantoprazole (PROTONIX) IV  40 mg Intravenous QHS     Ht: 5\' 3"  (160 cm)  Wt: 115 lb 11.9 oz (52.5 kg)  Ideal Wt: 52.3 kg % Ideal Wt: 100%  Usual Wt: 56.4 kg Wt Readings from Last 3 Encounters:  11/05/11 115 lb 11.9 oz (52.5 kg)  11/05/11 115 lb 11.9 oz (52.5 kg)  10/31/11 118 lb (53.524 kg)  08/29/11  122 lbs  % Usual Wt: 93%   Body mass index is 20.50 kg/(m^2). WNL  Food/Nutrition Related Hx: Pt only eats 2 meals daily, one at 7 am and again at 1230 pm. Pt struggles with reflux and states that if she eats after 1 pm she has severe pain. Weight loss recently from N/V.   Labs:  CMP     Component Value Date/Time   NA 142 11/07/2011 0605   K 2.8* 11/07/2011 0605   CL 103 11/07/2011 0605   CO2 31 11/07/2011 0605   GLUCOSE 112* 11/07/2011 0605   BUN 19 11/07/2011 0605   CREATININE 0.78 11/07/2011 0605   CALCIUM 8.7 11/07/2011 0605   PROT 5.4* 11/06/2011 0440   ALBUMIN 3.1* 11/06/2011 0440   AST 15 11/06/2011 0440   ALT 8 11/06/2011 0440   ALKPHOS 61 11/06/2011 0440   BILITOT 0.6 11/06/2011 0440   GFRNONAA 77* 11/07/2011 0605   GFRAA 89* 11/07/2011 0605    Intake/Output Summary (Last 24 hours) at 11/08/11 1253 Last data filed at 11/08/11 1914  Gross per 24 hour    Intake 3959.67 ml  Output      4 ml  Net 3955.67 ml     Diet Order: Post Gastrectomy  Supplements/Tube Feeding: none  IVF:    dextrose 5 % and 0.9% NaCl Last Rate: 100 mL/hr at 11/08/11 1044    Estimated Nutritional Needs:   Kcal: 1250-1450 kcal Protein: 50-60 gm  Fluid:  > 1.5 L Pt has altered meal plan based on what she can tolerate, she has been eating this way for a long time. RD was consulted for help with diet changes/meal ideas for gastroparesis. Based on pt's recall she is eating small meals usually with several hours between meals. Does not eat high fat foods, does not eat high fiber foods. Pt does on occasion eat a dessert after her lunch meal, RD recommended waiting at least 1 hour before eating dessert if it is a high fat food.  Pt also with 7.2% weight loss since the end of January, severe weight loss. Based on pts food recall, likely able to meet needs through intake, but with recent vomiting  was not meeting needs. Pt is at risk for malnutrition based on weight loss and altered eating schedule.   NUTRITION DIAGNOSIS: -Inadequate oral intake (NI-2.1).  Status: Ongoing  RELATED TO: N/V recently  AS EVIDENCE BY: weight loss  MONITORING/EVALUATION(Goals): Goal: Po intake of meals and supplements will meet >90% of estimated needs Monitor: Po intake, weight, labs, I/O's, N/V  EDUCATION NEEDS: -Education needs addressed  INTERVENTION: 1. Resource Breeze BID to increase kcal and protein intake 2. RD will continue to follow  Dietitian [phone]  DOCUMENTATION CODES Per approved criteria  -Not Applicable    [name] 11/08/2011, 12:50 PM

## 2011-11-08 NOTE — Progress Notes (Signed)
Utilization Review Completed.[name] T4/04/2012   

## 2011-11-09 DIAGNOSIS — K219 Gastro-esophageal reflux disease without esophagitis: Secondary | ICD-10-CM

## 2011-11-09 LAB — BASIC METABOLIC PANEL
BUN: 5 mg/dL — ABNORMAL LOW (ref 6–23)
Chloride: 106 mEq/L (ref 96–112)
Creatinine, Ser: 0.64 mg/dL (ref 0.50–1.10)
GFR calc Af Amer: >90 mL/min (ref >90)
GFR calc non Af Amer: 82 mL/min — ABNORMAL LOW (ref >90)
Glucose, Bld: 86 mg/dL (ref 70–99)
Potassium: 2.5 mEq/L — CL (ref 3.5–5.1)

## 2011-11-09 MED ORDER — SUCRALFATE 1 GM/10ML PO SUSP
1.0000 g | Freq: Two times a day (BID) | ORAL | Status: DC
  Administered 2011-11-09 – 2011-11-10 (×3): 1 g via ORAL
  Filled 2011-11-09 (×5): qty 10

## 2011-11-09 MED ORDER — ONDANSETRON HCL 4 MG PO TABS
4.0000 mg | ORAL_TABLET | Freq: Three times a day (TID) | ORAL | Status: DC
  Administered 2011-11-09 – 2011-11-10 (×3): 4 mg via ORAL
  Filled 2011-11-09 (×5): qty 1

## 2011-11-09 MED ORDER — POTASSIUM CHLORIDE 10 MEQ/100ML IV SOLN
10.0000 meq | INTRAVENOUS | Status: AC
  Administered 2011-11-09 (×4): 10 meq via INTRAVENOUS
  Filled 2011-11-09 (×4): qty 100

## 2011-11-09 MED ORDER — BISOPROLOL-HYDROCHLOROTHIAZIDE 2.5-6.25 MG PO TABS
1.0000 | ORAL_TABLET | Freq: Every day | ORAL | Status: DC
  Administered 2011-11-09 – 2011-11-10 (×2): 1 via ORAL
  Filled 2011-11-09 (×2): qty 1

## 2011-11-09 MED ORDER — POTASSIUM CHLORIDE CRYS ER 20 MEQ PO TBCR
40.0000 meq | EXTENDED_RELEASE_TABLET | Freq: Once | ORAL | Status: AC
  Administered 2011-11-09: 40 meq via ORAL
  Filled 2011-11-09: qty 2

## 2011-11-09 NOTE — Progress Notes (Signed)
[facility] Daily Rounding Note 11/09/2011, 8:53 AM  SUBJECTIVE:       Feels full, but not nauseated.  Met with dietician.  Pt has been taking BID Carafate since early 1990's for acid reflux.  Dr Christella Hartigan had increased to 4 x daily in last few months. Not clear it conferred any benefit at higher dose.    OBJECTIVE:        General: looks well.  Exceptionally pleasant.      Vital signs in last 24 hours:    Temp:  [97.7 F (36.5 C)-98.4 F (36.9 C)] 98.4 F (36.9 C) (04/10 0630) Pulse Rate:  [59-63] 59  (04/10 0630) Resp:  [17-18] 17  (04/10 0630) BP: (118-137)/(76-85) 118/76 mmHg (04/10 0630) SpO2:  [95 %-98 %] 96 % (04/10 0630) Weight:  [134 lb 14.7 oz (61.2 kg)] 134 lb 14.7 oz (61.2 kg) (04/10 0630) Last BM Date: 11/08/11  Heart: RRR Chest: clear B.  Breathing easily. Abdomen: soft, NT, ND.  BS active  Extremities: no pedal edema. Neuro/Psych:  Pleasant.  Relaxed.  Not confused.  Resting tremor in right hand.  Intake/Output from previous day: 04/09 0701 - 04/10 0700 In: 1441.3 [P.O.:480; I.V.:961.3] Out: -   Intake/Output this shift:    Lab Results:  [facility] 11/07/11 0605  WBC 5.8  HGB 12.8  HCT 39.6  PLT 227   BMET  Basename 11/09/11 0529 11/08/11 1259 11/07/11 0605  NA 141 142 142  K REPEATED TO VERIFY 3.6 2.8*  CL 106 108 103  CO2 28 28 31   GLUCOSE 86 112* 112*  BUN 5* 7 19  CREATININE 0.64 0.66 0.78  CALCIUM 8.3* 8.7 8.7   Studies/Results: Dg Ugi W/kub  11/08/2011  *RADIOLOGY REPORT*  Clinical Data:  Gastric pull-through.  Persistent intrathoracic gastric dilatation.  UPPER GI SERIES WITH KUB  Technique: Oral thin barium was administered to the patient in the LPO position and in the standing position in order to assess the intrathoracic stomach. Fluoroscopy Time: 3.46 minutes  Comparison:  02/09/2005  Findings: Due to mild limitations with patient positioning/mobility, the pharyngeal phase of swallowing was only observed at real time, without obvious  abnormality.  Gastric pull-through noted.  Emptying from the intrathoracic stomach into the small bowel at the hiatus was sluggish, although no obvious obstruction is observed.  Luminal diameter between the stomach and the bowel is at 13 mm.  Adjacent small outpouching of contrast, for example on series 20 of 21, is thought to be incidental rugal fold and less likely represent an ulcer.  Barium did proceed sluggishly into the small bowel, and the visualized proximal small bowel appears normal.  IMPRESSION:  1.  Slow procession of barium from the intrathoracic stomach into the small bowel.  The only potential narrowing visualized was at the junction of the stomach and small bowel with the hiatus, but even that narrowing was at least 13 mm in diameter.  If clinically warranted, quantification of the gastric emptying could be performed using a nuclear medicine gastric emptying test.  Original Report Authenticated By: Dellia Cloud, M.D.    ASSESMENT: 1.  Gastroparesis.  Leave on BID reglan.  No increase in dose due to med induced tremor, but have asked her to change timing of drug to before her AM meal and lunch, the 2 meals she eats.   After discussing her meds with her, I think it prudent to leave her on her previous BID dose of Carafate, as reflux is an  ongoing issue for her despite PPI, and she is likely refluxing bile.  She says reflux is worse and prevalent in evening so will have her take the 2 doses then. 2.  Hypokalemia.  K level normalized yesterday, but depleted again today.    PLAN: 1.  Has ROV with Dr Christella Hartigan 12/02/11.  Can perhaps be initiated to Domperidone then.  2.  Would give potassium elixir, will be easier to tolerate than the large "horse pill" formulation.   3.  As above, start Carafate.  4.  Will trial scheduled Zofran, see if it helps.   LOS: 4 days   Jennye Moccasin  11/09/2011, 8:53 AM Pager: [phone]

## 2011-11-09 NOTE — Progress Notes (Signed)
CRITICAL VALUE ALERT  Critical value received:  K:  2.5  Date of notification:  11/09/2011   Time of notification:  7:50 AM   Critical value read back:yes  Nurse who received alert:  Kennyth Arnold, RN  MD notified (1st page):  Dr. Jerral Ralph  Time of first page:  7:50 AM   MD notified (2nd page):  Time of second page:  Responding MD:  Dr. Jerral Ralph  Time MD responded:  7:50 AM

## 2011-11-09 NOTE — Progress Notes (Signed)
I have taken an interval history, reviewed the chart and examined the patient. I agree with the extender's note, impression and recommendations. I anticipate that she will need medication adjustments as OP with Dr. Christella Hartigan. Continue current GI medications, a gastroparesis diet and antireflux measures long term. OK for discharge from GI standpoint.  We will sign off.  Venita Lick. Russella Dar MD Clementeen Graham

## 2011-11-09 NOTE — Evaluation (Signed)
Occupational Therapy Evaluation Patient Details Name: [name] MRN: [id number] DOB: 06-24-1929 Today's Date: 11/09/2011  Problem List:  Patient Active Problem List  Diagnoses  . B12 DEFICIENCY  . ANEMIA-IRON DEFICIENCY  . HYPERTENSION  . ALLERGIC RHINITIS  . ASPIRATION PNEUMONIA, RIGHT LOWER LOBE  . GERD  . OSTEOPOROSIS  . COUGH  . HX OF ESOPHAGEAL CANCER  . PERSONAL HISTORY OF COLONIC POLYPS  . INSOMNIA  . Asthma with bronchitis  . Unspecified gastritis and gastroduodenitis without mention of hemorrhage  . Nonspecific (abnormal) findings on radiological and other examination of gastrointestinal tract  . Vomiting  . Dysphagia  . Esophageal stricture  . Gastric outlet obstruction  . Leukocytosis    Past Medical History:  Past Medical History  Diagnosis Date  . GERD (gastroesophageal reflux disease)   . Osteoporosis   . Postmenopausal HRT (hormone replacement therapy)   . Esophageal cancer   . Anemia   . Hypertension   . Allergy   . Asthma   . Shortness of breath   . Pneumonia     hx of pneumonia   Past Surgical History:  Past Surgical History  Procedure Date  . Left oophorectomy   . Esophageal removal of cancer,gastric ppull-thru 1993   . Cholecystectomy   . Rt arm fx   . Oophorectomy   . Esophagogastroduodenoscopy 10/19/2011    Procedure: ESOPHAGOGASTRODUODENOSCOPY (EGD);  Surgeon: Rachael Fee, MD;  Location: [facility];  Service: Endoscopy;  Laterality: N/A;  . Balloon dilation 10/19/2011    Procedure: BALLOON DILATION;  Surgeon: Rachael Fee, MD;  Location: [facility] ENDOSCOPY;  Service: Endoscopy;  Laterality: N/A;  . Esophagogastroduodenoscopy 11/07/2011    Procedure: ESOPHAGOGASTRODUODENOSCOPY (EGD);  Surgeon: Meryl Dare, MD,FACG;  Location: [facility] ENDOSCOPY;  Service: Endoscopy;  Laterality: N/A;    OT Assessment/Plan/Recommendation OT Assessment Clinical Impression Statement: This 78 yo female admitted with gastric outlet syndrome presents  to acute OT at an Independent level. No further OT needs, will sign off. OT Recommendation/Assessment: Patient does not need any further OT services OT Recommendation Follow Up Recommendations: No OT follow up Equipment Recommended: None recommended by OT OT Goals    OT Evaluation Precautions/Restrictions  Precautions Precautions:  (None) Required Braces or Orthoses: No Restrictions Weight Bearing Restrictions: No Prior Functioning Home Living Lives With: Alone Receives Help From:  (No one) Type of Home: House Home Layout: One level Bathroom Shower/Tub: Walk-in shower;Door [employer] Toilet: Handicapped height Bathroom Accessibility: Yes How Accessible: Accessible via walker Home Adaptive Equipment: Grab bars in shower (on door) Additional Comments: built in seat in shower Prior Function Level of Independence: Independent with basic ADLs;Independent with homemaking with ambulation;Independent with gait;Independent with transfers Driving: Yes Vocation: Retired Comments: 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 ADL ADL Eating/Feeding: Simulated;Independent Where Assessed - Eating/Feeding: Edge of bed Grooming: Simulated;Independent Where Assessed - Grooming: Standing at sink Upper Body Bathing: Simulated;Independent Where Assessed - Upper Body Bathing: Unsupported;Sit to stand from bed Lower Body Bathing: Simulated;Independent Where Assessed - Lower Body Bathing: Unsupported;Sit to stand from bed Upper Body Dressing: Simulated;Independent Where Assessed - Upper Body Dressing: Sit to stand from bed;Unsupported Lower Body Dressing: Simulated;Independent Where Assessed - Lower Body Dressing: Unsupported;Sit to stand from bed Toilet Transfer: Performed;Independent Toilet Transfer Method: Proofreader: Raised toilet seat with arms (or 3-in-1 over toilet) Toileting - Clothing Manipulation:  Simulated;Independent Where Assessed - Toileting Clothing Manipulation: Standing Toileting - Hygiene: Simulated;Independent Tub/Shower  Transfer: Performed;Modified independent (held onto wall for in/out) Tub/Shower Transfer Method: Science writer:  (None) Equipment Used:  (None) Ambulation Related to ADLs: I from 5500 to unit 1610 and back Vision/Perception  Vision - History Baseline Vision: No visual deficits Cognition Cognition Arousal/Alertness: Awake/alert Overall Cognitive Status: Appears within functional limits for tasks assessed Orientation Level: Oriented X4 Sensation/Coordination Coordination Gross Motor Movements are Fluid and Coordinated: Yes Fine Motor Movements are Fluid and Coordinated: Yes Extremity Assessment RUE Assessment RUE Assessment: Within Functional Limits LUE Assessment LUE Assessment: Within Functional Limits Mobility  Bed Mobility Bed Mobility: Yes Supine to Sit: 7: Independent;HOB elevated (Comment degrees) (40 degrees) Sitting - Scoot to Edge of Bed: 7: Independent Transfers Transfers: Yes Sit to Stand: 7: Independent;With upper extremity assist;From bed Stand to Sit: 7: Independent;With upper extremity assist;With armrests;To chair/3-in-1 Exercises   End of Session OT - End of Session Equipment Utilized During Treatment:  (3-n-1 over toliet to make it higher like hers at home) Activity Tolerance: Patient tolerated treatment well Patient left: in chair;with call bell in reach;with family/visitor present General Behavior During Session: [facility] for tasks performed Cognition: [facility] for tasks performed   Evette Georges [phone] 11/09/2011, 10:48 AM

## 2011-11-09 NOTE — Progress Notes (Signed)
Order received, chart reviewed, pt seen for evaluation without any follow up needed. Pt and family are concerned that it is not fully understood what is happening with her upper and lower GI system due to what she had to have done due to cancer some years ago and fear that she will be right back in here within a weeks time. Pt reports feeling full and nauseated to me--made RN aware of nausea. Acute OT will sign off.  Ignacia Palma, [place] [phone] 11/09/2011

## 2011-11-09 NOTE — Progress Notes (Signed)
DAILY PROGRESS NOTE                              [facility]  SUBJECTIVE: Denies any nausea or vomiting.Tolerating diet so far  OBJECTIVE: BP 123/79  Pulse 73  Temp(Src) 97.7 F (36.5 C) (Oral)  Resp 18  Ht 5\' 3"  (1.6 m)  Wt 61.2 kg (134 lb 14.7 oz)  BMI 23.90 kg/m2  SpO2 94%  Intake/Output Summary (Last 24 hours) at 11/09/11 1431 Last data filed at 11/09/11 1320  Gross per 24 hour  Intake 1254.67 ml  Output      0 ml  Net 1254.67 ml                      Weight change:   Physical Exam: General: Alert and awake oriented x3 not in any acute distress. HEENT: anicteric sclera, pupils equal reactive to light and accommodation CVS: S1-S2 heard, no murmur rubs or gallops Chest: clear to auscultation bilaterally, no wheezing rales or rhonchi Abdomen:  normal bowel sounds, soft, nontender, nondistended, no organomegaly Neuro: Cranial nerves II-XII intact, no focal neurological deficits Extremities: no cyanosis, no clubbing or edema noted bilaterally   Lab Results:  Basename 11/09/11 0529 11/08/11 1259  NA 141 142  K 2.5* 3.6  CL 106 108  CO2 28 28  GLUCOSE 86 112*  BUN 5* 7  CREATININE 0.64 0.66  CALCIUM 8.3* 8.7  MG -- --  PHOS -- --   No results found for this basename: AST:2,ALT:2,ALKPHOS:2,BILITOT:2,PROT:2,ALBUMIN:2 in the last 72 hours No results found for this basename: LIPASE:2,AMYLASE:2 in the last 72 hours  Basename 11/07/11 0605  WBC 5.8  NEUTROABS --  HGB 12.8  HCT 39.6  MCV 88.4  PLT 227   Micro Results: No results found for this or any previous visit (from the past 240 hour(s)).  Studies/Results: Dg Chest 1 View  11/05/2011  *RADIOLOGY REPORT*  Clinical Data: Nasogastric tube placement.  CHEST - 1 VIEW  Comparison: 11/05/2011 chest radiograph from 5:04 p.m.  Findings: The patient has a gastric pull-through.  The nasogastric tube projects over the lower thorax in the lower portion of the gastric pull-through.  The  intrathoracic stomach has been decompressed, and is significantly reduced in size compared to the prior chest radiograph.  Heart size is within normal limits.  There is mild atelectasis medially in both lung bases.  Tortuous thoracic aorta noted.  IMPRESSION:  1.  Nasogastric tube is in the lower part of the intrathoracic stomach, and has successfully decompressed the gastric pull- through. 2.  Mild atelectasis in both lung bases.  Original Report Authenticated By: Dellia Cloud, M.D.   Dg Chest 2 View  11/05/2011  *RADIOLOGY REPORT*  Clinical Data: Nausea and vomiting.  Esophageal cancer with gastric pull-through 1993  CHEST - 2 VIEW  Comparison: 10/17/2011  Findings: There has been significant change from the recent chest x- ray.  There is a large soft tissue density in the right paratracheal region as well as to the right of the heart.  Given history of gastric pull-through, this is most likely a fluid-filled distended stomach which may be obstructed.  Aneurysm and neoplasm considered unlikely given the time course as well as a history of prior gastric pull-through.  Cardiac enlargement.  Negative for heart failure.  No pleural effusion.  Probable COPD with hyperinflation of the lungs.  IMPRESSION: There has been any  significant change from the  recent chest x-ray. There is a large soft tissue mass in the mediastinum projecting to the right of midline.  This is most likely a fluid filled obstructed stomach in this patient with a gastric pull-through. This could be confirmed with CT scanning.  Original Report Authenticated By: Camelia Phenes, M.D.   Dg Chest 2 View  10/17/2011  *RADIOLOGY REPORT*  Clinical Data: Mid chest pain after eating for approximately 6 weeks, history hypertension, asthma, GERD, esophageal cancer  CHEST - 2 VIEW  Comparison: 12/22/2010  Findings: Enlargement of cardiac silhouette. Calcified tortuous aorta. Pulmonary vascularity normal. Surgical clips at the cervical region, mid  chest and left upper quadrant suspect related to esophagectomy and gastric pull-up. Emphysematous and chronic bronchitic changes. Bibasilar atelectasis versus scarring. No definite acute infiltrate, pleural effusion or pneumothorax. Bones demineralized.  IMPRESSION: Enlargement of cardiac silhouette. Emphysematous and chronic bronchitic changes with bibasilar atelectasis versus scarring.  Original Report Authenticated By: Lollie Marrow, M.D.   Ct Chest Wo Contrast  11/05/2011  *RADIOLOGY REPORT*  Clinical Data: Nausea, vomiting, status post gastric pull-through, rule out mediastinal mass.  CT CHEST WITHOUT CONTRAST  Technique:  Multidetector CT imaging of the chest was performed following the standard protocol without IV contrast.  Comparison: CT chest 11/05/2009 and chest x-ray 11/05/2011.  Findings: Again noted status post esophagectomy and gastric pull- through.  There is a marked gastric distention with fluid which is giving mass appearance on the chest x-ray.  There is mass effect on the heart mediastinal structures.  Findings are highly suspicious for gastric outlet obstruction and fluid retention in the stomach. Atherosclerotic calcifications of thoracic aorta are noted.  There is some mass effect on the trachea and main bronchi which are still patent.  There is no evidence of aspiration pneumonia.  Sagittal images of the spine shows osteopenia and mild degenerative changes thoracic spine.  Degenerative changes of the cervical spine are noted.  IMPRESSION:  1.  There is a status post esophagectomy with gastric pull through. There is marked distention of intrathoracic stomach with fluid. Findings are highly suspicious for gastric outlet obstruction. There is mass effect on the heart and mediastinal structures. 2.  No evidence of aspiration.  No pulmonary edema. 3.  Osteopenia and degenerative changes thoracic spine.  Critical findings discussed with Dr. Golda Acre from emergency room.  Original Report  Authenticated By: Natasha Mead, M.D.   Medications: Scheduled Meds:    . feeding supplement  1 Container Oral BID BM  . metoCLOPramide  5 mg Oral BID WC  . ondansetron  4 mg Oral TID AC  . pantoprazole  40 mg Oral Q0600  . potassium chloride  10 mEq Intravenous Q1 Hr x 4  . potassium chloride  40 mEq Oral Once  . sodium chloride  3 mL Intravenous Q12H  . sucralfate  1 g Oral BID AC   Continuous Infusions:    . dextrose 5 % and 0.9% NaCl 20 mL/hr at 11/08/11 1251   PRN Meds:.acetaminophen, acetaminophen, albuterol, guaiFENesin-dextromethorphan, LORazepam, morphine, ondansetron (ZOFRAN) IV, ondansetron, phenol  ASSESSMENT & PLAN:  Gastric outlet obstruction -EGD showed no gastric outlet obstruction, upper GI series this morning showed delayed gastric emptying. -Patient is on Reglan at home, she takes it twice a day it she only eats twice. -Continue Reglan, advance diet,  Hypertension -resume prior anti-hypertensives.  Hypokalemia -This is likely secondary to the NG tube suction. -replete today  History of esophageal cancer -Status post esophagectomy with stomach  pull-through in 1997.  Disposition -likely home in am   LOS: 4 days   [facility] 11/09/2011, 2:31 PM

## 2011-11-10 LAB — BASIC METABOLIC PANEL
BUN: <3 mg/dL — ABNORMAL LOW (ref 6–23)
Creatinine, Ser: 0.69 mg/dL (ref 0.50–1.10)
GFR calc non Af Amer: 80 mL/min — ABNORMAL LOW (ref >90)
Glucose, Bld: 85 mg/dL (ref 70–99)
Potassium: 3.4 mEq/L — ABNORMAL LOW (ref 3.5–5.1)

## 2011-11-10 MED ORDER — METOCLOPRAMIDE HCL 5 MG PO TABS: 5.0000 mg | ORAL_TABLET | Freq: Two times a day (BID) | ORAL | Status: DC

## 2011-11-10 MED ORDER — BOOST / RESOURCE BREEZE PO LIQD: 1.0000 | Freq: Two times a day (BID) | ORAL | Status: DC

## 2011-11-10 MED ORDER — SUCRALFATE 1 GM/10ML PO SUSP: 1.0000 g | Freq: Two times a day (BID) | ORAL | Status: DC

## 2011-11-10 MED ORDER — POTASSIUM CHLORIDE 20 MEQ/15ML (10%) PO LIQD
40.0000 meq | Freq: Once | ORAL | Status: AC
  Administered 2011-11-10: 40 meq via ORAL
  Filled 2011-11-10: qty 30

## 2011-11-10 MED ORDER — ONDANSETRON HCL 4 MG PO TABS: 4.0000 mg | ORAL_TABLET | Freq: Three times a day (TID) | ORAL | Status: AC

## 2011-11-10 NOTE — Progress Notes (Signed)
Pt discharged to home per MD order.  IV removed, dressing and pressure applied, site unremarkable.  Discharge instructions, follow-up appointments and prescriptions given.  All belongings sent with pt and all questions answered.  Pt transported in wheelchair by volunteer and traveled home with family by private vehicle.

## 2011-11-10 NOTE — Discharge Summary (Signed)
PATIENT DETAILS Name: [name] Age: 78 y.o. Sex: female Date of Birth: [birth year] MRN: [id number]. Admit Date: 11/05/2011 Admitting Physician: Therisa Doyne, MD WUJ:[id number],WGNF Ramon Dredge, MD, MD  PRIMARY DISCHARGE DIAGNOSIS:  Principal Problem:  *Gastroparesis Flare Active Problems:  HYPERTENSION  GERD  HX OF ESOPHAGEAL CANCER  Vomiting  Leukocytosis Hypokalemia      PAST MEDICAL HISTORY: Past Medical History  Diagnosis Date  . GERD (gastroesophageal reflux disease)   . Osteoporosis   . Postmenopausal HRT (hormone replacement therapy)   . Esophageal cancer   . Anemia   . Hypertension   . Allergy   . Asthma   . Shortness of breath   . Pneumonia     hx of pneumonia    DISCHARGE MEDICATIONS: Medication List  As of 11/10/2011 10:46 AM   TAKE these medications         albuterol 108 (90 BASE) MCG/ACT inhaler   Commonly known as: PROVENTIL HFA;VENTOLIN HFA   Inhale 2 puffs into the lungs every 6 (six) hours as needed. For wheezing      bisoprolol-hydrochlorothiazide 2.5-6.25 MG per tablet   Commonly known as: ZIAC   Take 1 tablet by mouth daily.      feeding supplement Liqd   Take 1 Container by mouth 2 (two) times daily between meals.      Hemocyte Plus 106-1 MG Caps   Take 106 mg by mouth 2 (two) times daily.      lansoprazole 30 MG disintegrating tablet   Commonly known as: PREVACID SOLUTAB   Take 2 tablets (60 mg total) by mouth 2 (two) times daily.      levocetirizine 5 MG tablet   Commonly known as: XYZAL   Take 5 mg by mouth every evening.      metoCLOPramide 5 MG tablet   Commonly known as: REGLAN   Take 1 tablet (5 mg total) by mouth 2 (two) times daily with breakfast and lunch.      ondansetron 4 MG tablet   Commonly known as: ZOFRAN   Take 1 tablet (4 mg total) by mouth 3 (three) times daily before meals.      promethazine 25 MG suppository   Commonly known as: PHENERGAN   Place 25 mg rectally every 6 (six) hours as needed. For  nausea      sucralfate 1 GM/10ML suspension   Commonly known as: CARAFATE   Take 10 mLs (1 g total) by mouth 2 (two) times daily before a meal.      VIACTIV 500-100-40 MG-UNT-MCG Chew   Generic drug: Calcium-Vitamin D-Vitamin K   Chew by mouth 3 (three) times daily.      Vitamin D (Ergocalciferol) 50000 UNITS Caps   Commonly known as: DRISDOL   Take 50,000 Units by mouth every 7 (seven) days. On Friday      zolpidem 5 MG tablet   Commonly known as: AMBIEN   Take 1 tablet (5 mg total) by mouth at bedtime as needed for sleep.             BRIEF HPI:  See H&P, Labs, Consult and Test reports for all details in brief, patient was admitted for nausea and vomiting. She apparently also had some pain sensation in the chest. A CT scan done in the emergency room showed dilated stomach with some questionable stomach outlet obstruction. She subsequently underwent NG tube placement with 1.5 L output. For further details please see the history and physical that was done on admission.  CONSULTATIONS:   GI  PERTINENT RADIOLOGIC STUDIES: Dg Chest 1 View  11/05/2011  *RADIOLOGY REPORT*  Clinical Data: Nasogastric tube placement.  CHEST - 1 VIEW  Comparison: 11/05/2011 chest radiograph from 5:04 p.m.  Findings: The patient has a gastric pull-through.  The nasogastric tube projects over the lower thorax in the lower portion of the gastric pull-through.  The intrathoracic stomach has been decompressed, and is significantly reduced in size compared to the prior chest radiograph.  Heart size is within normal limits.  There is mild atelectasis medially in both lung bases.  Tortuous thoracic aorta noted.  IMPRESSION:  1.  Nasogastric tube is in the lower part of the intrathoracic stomach, and has successfully decompressed the gastric pull- through. 2.  Mild atelectasis in both lung bases.  Original Report Authenticated By: Dellia Cloud, M.D.   Dg Chest 2 View  11/05/2011  *RADIOLOGY REPORT*  Clinical  Data: Nausea and vomiting.  Esophageal cancer with gastric pull-through 1993  CHEST - 2 VIEW  Comparison: 10/17/2011  Findings: There has been significant change from the recent chest x- ray.  There is a large soft tissue density in the right paratracheal region as well as to the right of the heart.  Given history of gastric pull-through, this is most likely a fluid-filled distended stomach which may be obstructed.  Aneurysm and neoplasm considered unlikely given the time course as well as a history of prior gastric pull-through.  Cardiac enlargement.  Negative for heart failure.  No pleural effusion.  Probable COPD with hyperinflation of the lungs.  IMPRESSION: There has been any significant change from the  recent chest x-ray. There is a large soft tissue mass in the mediastinum projecting to the right of midline.  This is most likely a fluid filled obstructed stomach in this patient with a gastric pull-through. This could be confirmed with CT scanning.  Original Report Authenticated By: Camelia Phenes, M.D.   Dg Chest 2 View  10/17/2011  *RADIOLOGY REPORT*  Clinical Data: Mid chest pain after eating for approximately 6 weeks, history hypertension, asthma, GERD, esophageal cancer  CHEST - 2 VIEW  Comparison: 12/22/2010  Findings: Enlargement of cardiac silhouette. Calcified tortuous aorta. Pulmonary vascularity normal. Surgical clips at the cervical region, mid chest and left upper quadrant suspect related to esophagectomy and gastric pull-up. Emphysematous and chronic bronchitic changes. Bibasilar atelectasis versus scarring. No definite acute infiltrate, pleural effusion or pneumothorax. Bones demineralized.  IMPRESSION: Enlargement of cardiac silhouette. Emphysematous and chronic bronchitic changes with bibasilar atelectasis versus scarring.  Original Report Authenticated By: Lollie Marrow, M.D.   Ct Chest Wo Contrast  11/05/2011  *RADIOLOGY REPORT*  Clinical Data: Nausea, vomiting, status post gastric  pull-through, rule out mediastinal mass.  CT CHEST WITHOUT CONTRAST  Technique:  Multidetector CT imaging of the chest was performed following the standard protocol without IV contrast.  Comparison: CT chest 11/05/2009 and chest x-ray 11/05/2011.  Findings: Again noted status post esophagectomy and gastric pull- through.  There is a marked gastric distention with fluid which is giving mass appearance on the chest x-ray.  There is mass effect on the heart mediastinal structures.  Findings are highly suspicious for gastric outlet obstruction and fluid retention in the stomach. Atherosclerotic calcifications of thoracic aorta are noted.  There is some mass effect on the trachea and main bronchi which are still patent.  There is no evidence of aspiration pneumonia.  Sagittal images of the spine shows osteopenia and mild degenerative changes thoracic spine.  Degenerative changes of the cervical spine are noted.  IMPRESSION:  1.  There is a status post esophagectomy with gastric pull through. There is marked distention of intrathoracic stomach with fluid. Findings are highly suspicious for gastric outlet obstruction. There is mass effect on the heart and mediastinal structures. 2.  No evidence of aspiration.  No pulmonary edema. 3.  Osteopenia and degenerative changes thoracic spine.  Critical findings discussed with Dr. Golda Acre from emergency room.  Original Report Authenticated By: Natasha Mead, M.D.   Varney Biles Kayleen Memos Hans Eden  11/08/2011  *RADIOLOGY REPORT*  Clinical Data:  Gastric pull-through.  Persistent intrathoracic gastric dilatation.  UPPER GI SERIES WITH KUB  Technique: Oral thin barium was administered to the patient in the LPO position and in the standing position in order to assess the intrathoracic stomach. Fluoroscopy Time: 3.46 minutes  Comparison:  02/09/2005  Findings: Due to mild limitations with patient positioning/mobility, the pharyngeal phase of swallowing was only observed at real time, without obvious  abnormality.  Gastric pull-through noted.  Emptying from the intrathoracic stomach into the small bowel at the hiatus was sluggish, although no obvious obstruction is observed.  Luminal diameter between the stomach and the bowel is at 13 mm.  Adjacent small outpouching of contrast, for example on series 20 of 21, is thought to be incidental rugal fold and less likely represent an ulcer.  Barium did proceed sluggishly into the small bowel, and the visualized proximal small bowel appears normal.  IMPRESSION:  1.  Slow procession of barium from the intrathoracic stomach into the small bowel.  The only potential narrowing visualized was at the junction of the stomach and small bowel with the hiatus, but even that narrowing was at least 13 mm in diameter.  If clinically warranted, quantification of the gastric emptying could be performed using a nuclear medicine gastric emptying test.  Original Report Authenticated By: Dellia Cloud, M.D.     PERTINENT LAB RESULTS: CBC: No results found for this basename: WBC:2,HGB:2,HCT:2,PLT:2 in the last 72 hours CMET CMP     Component Value Date/Time   NA 143 11/10/2011 0525   K 3.4* 11/10/2011 0525   CL 107 11/10/2011 0525   CO2 30 11/10/2011 0525   GLUCOSE 85 11/10/2011 0525   BUN <3* 11/10/2011 0525   CREATININE 0.69 11/10/2011 0525   CALCIUM 8.8 11/10/2011 0525   PROT 5.4* 11/06/2011 0440   ALBUMIN 3.1* 11/06/2011 0440   AST 15 11/06/2011 0440   ALT 8 11/06/2011 0440   ALKPHOS 61 11/06/2011 0440   BILITOT 0.6 11/06/2011 0440   GFRNONAA 80* 11/10/2011 0525   GFRAA >90 11/10/2011 0525    GFR Estimated Creatinine Clearance: 46.4 ml/min (by C-G formula based on Cr of 0.69). No results found for this basename: LIPASE:2,AMYLASE:2 in the last 72 hours No results found for this basename: CKTOTAL:3,CKMB:3,CKMBINDEX:3,TROPONINI:3 in the last 72 hours No components found with this basename: POCBNP:3 No results found for this basename: DDIMER:2 in the last 72 hours No  results found for this basename: HGBA1C:2 in the last 72 hours No results found for this basename: CHOL:2,HDL:2,LDLCALC:2,TRIG:2,CHOLHDL:2,LDLDIRECT:2 in the last 72 hours No results found for this basename: TSH,T4TOTAL,FREET3,T3FREE,THYROIDAB in the last 72 hours No results found for this basename: VITAMINB12:2,FOLATE:2,FERRITIN:2,TIBC:2,IRON:2,RETICCTPCT:2 in the last 72 hours Coags: No results found for this basename: PT:2,INR:2 in the last 72 hours Microbiology: No results found for this or any previous visit (from the past 240 hour(s)).   BRIEF HOSPITAL COURSE:   Principal Problem:  *Flare  of gastroparesis  -Patient presented to the ED with nausea and fullness of the chest followed by vomiting and intermittent hematemesis. A CT scan of the chest done on admission showed a dilated stomach. Patient subsequently underwent NG tube placement. Initially it was thought that the patient did have gastric outlet obstruction, however EGD did not show any gastric outlet obstruction, a subsequent upper GI series was suspicious for delayed gastric emptying. She was then provided with supportive care and restarted on her usual dosing of her Reglan. Her diet was slowly advanced. Diet for gastroparesis was explained to her in detail. GI is currently recommending to leave her on a trial of scheduled Zofran. Currently she is tolerating a regular diet. Would also leave on scheduled Carafate. She will followup with her primary gastroenterologist as indicated below.  Hypokalemia -This is secondary to NG tube suction and vomiting. -Vomiting has since resolved, her potassium was repeated this admission.  Hypertension  -resume prior anti-hypertensives.   History of esophageal cancer  -Status post esophagectomy with stomach pull-through in 1997.    TODAY-DAY OF DISCHARGE:  Subjective:   [name] today has no headache,no chest abdominal pain,no new weakness tingling or numbness, feels much better  wants to go home today. She's had no further vomiting and is actually tolerating her diet pretty well. She has been seen by physical therapy services and deemed not to need any further assistance at home.   Objective:   Blood pressure 140/77, pulse 61, temperature 97.2 F (36.2 C), temperature source Oral, resp. rate 16, height 5\' 3"  (1.6 m), weight 61.2 kg (134 lb 14.7 oz), SpO2 95.00%.  Intake/Output Summary (Last 24 hours) at 11/10/11 1046 Last data filed at 11/10/11 0928  Gross per 24 hour  Intake   1400 ml  Output      0 ml  Net   1400 ml    Exam Awake Alert, Oriented *3, No new F.N deficits, Normal affect [place].AT,PERRAL Supple Neck,No JVD, No cervical lymphadenopathy appriciated.  Symmetrical Chest wall movement, Good air movement bilaterally, CTAB RRR,No Gallops,Rubs or new Murmurs, No Parasternal Heave +ve B.Sounds, Abd Soft, Non tender, No organomegaly appriciated, No rebound -guarding or rigidity. No Cyanosis, Clubbing or edema, No new Rash or bruise  DISPOSITION:  home    DISCHARGE INSTRUCTIONS:    Follow-up Information    Follow up with Rob Bunting, MD on 12/02/2011. (9:15 AM with the Gastroenterologist)    Contact information:   [address] [place] [address] [phone]       Follow up with Carrie Mew, MD. Schedule an appointment as soon as possible for a visit in 2 weeks.   Contact information:   [address] [place] [address] [phone]         Total Time spent on discharge equals 45 minutes.  SignedJeoffrey Massed 11/10/2011 10:46 AM

## 2011-11-14 ENCOUNTER — Telehealth: Payer: Self-pay | Admitting: Internal Medicine

## 2011-11-14 NOTE — Telephone Encounter (Signed)
Pt called and needs hosp fup for either this wk or next wk to see Dr Lovell Sheehan. Pt was admitted due to potassium lvl drop and pts stomach had to be pumped because it was not processing food. Pt is at dentist this morning, so pls leave a detailed vm if pt not avail. Pls advise.

## 2011-11-14 NOTE — Telephone Encounter (Signed)
Ov given for 4-17,wed at 3:30-Left message on machine For pt

## 2011-11-16 ENCOUNTER — Encounter: Payer: Self-pay | Admitting: Internal Medicine

## 2011-11-16 ENCOUNTER — Ambulatory Visit (INDEPENDENT_AMBULATORY_CARE_PROVIDER_SITE_OTHER): Payer: Medicare Other | Admitting: Internal Medicine

## 2011-11-16 VITALS — BP 110/60 | HR 72 | Temp 98.2°F | Resp 16 | Ht 63.0 in | Wt 117.0 lb

## 2011-11-16 DIAGNOSIS — E876 Hypokalemia: Secondary | ICD-10-CM

## 2011-11-16 DIAGNOSIS — R1110 Vomiting, unspecified: Secondary | ICD-10-CM

## 2011-11-16 DIAGNOSIS — D509 Iron deficiency anemia, unspecified: Secondary | ICD-10-CM

## 2011-11-16 DIAGNOSIS — K30 Functional dyspepsia: Secondary | ICD-10-CM

## 2011-11-16 DIAGNOSIS — K219 Gastro-esophageal reflux disease without esophagitis: Secondary | ICD-10-CM

## 2011-11-16 DIAGNOSIS — K3189 Other diseases of stomach and duodenum: Secondary | ICD-10-CM

## 2011-11-16 DIAGNOSIS — K311 Adult hypertrophic pyloric stenosis: Secondary | ICD-10-CM

## 2011-11-16 DIAGNOSIS — I10 Essential (primary) hypertension: Secondary | ICD-10-CM

## 2011-11-16 MED ORDER — ZOLPIDEM TARTRATE 5 MG PO TABS: 5.0000 mg | ORAL_TABLET | Freq: Every evening | ORAL | Status: DC | PRN

## 2011-11-16 MED ORDER — METOCLOPRAMIDE HCL 5 MG PO TABS: 5.0000 mg | ORAL_TABLET | Freq: Three times a day (TID) | ORAL | Status: DC

## 2011-11-16 NOTE — Progress Notes (Signed)
Addended by: Willy Eddy on: 11/16/2011 05:01 PM   Modules accepted: Orders

## 2011-11-16 NOTE — Progress Notes (Signed)
Subjective:    Patient ID: [name], female    DOB: Aug 20, 1928, 78 y.o.   MRN: [id number]  HPI Is an 78 year old female who was recently hospitalized for intractable nausea and vomiting she has a pull-through gastrtic reconstruction of her esophagus due to esophageal cancer a history of chronic stricture and she was hospitalized with gastric outlet obstruction and persistent nausea and vomiting.  She was also found to have a low potassium that required a significant IV potassium replacement.  She was placed on a supplement for nutritional support due to her weight loss and inability to eat and she has had difficulty absorbing and tolerating this regimen. She was given a gastroparesis diet and was given a sheet to take twice daily  Today she presents with persistent nausea. She has not vomited today   Review of Systems  Constitutional: Positive for activity change, appetite change and fatigue.  HENT: Positive for congestion and sinus pressure.   Respiratory: Positive for choking.   Cardiovascular: Positive for chest pain.  Gastrointestinal: Positive for abdominal distention.  Genitourinary: Negative for urgency, decreased urine volume, difficulty urinating and pelvic pain.  Musculoskeletal: Positive for myalgias and joint swelling.  Neurological: Positive for weakness and light-headedness.   Past Medical History  Diagnosis Date  . GERD (gastroesophageal reflux disease)   . Osteoporosis   . Postmenopausal HRT (hormone replacement therapy)   . Esophageal cancer   . Anemia   . Hypertension   . Allergy   . Asthma   . Shortness of breath   . Pneumonia     hx of pneumonia    History   Social History  . Marital Status: Single    Spouse Name: N/A    Number of Children: 1  . Years of Education: N/A   Occupational History  . Retired    Social History Main Topics  . Smoking status: Never Smoker   . Smokeless tobacco: Never Used  . Alcohol Use: No  . Drug Use: No    . Sexually Active: Yes   Other Topics Concern  . Not on file   Social History Narrative   DAILY CAFFEINE USE    Past Surgical History  Procedure Date  . Left oophorectomy   . Esophageal removal of cancer,gastric ppull-thru 1993   . Cholecystectomy   . Rt arm fx   . Oophorectomy   . Esophagogastroduodenoscopy 10/19/2011    Procedure: ESOPHAGOGASTRODUODENOSCOPY (EGD);  Surgeon: Rachael Fee, MD;  Location: [facility];  Service: Endoscopy;  Laterality: N/A;  . Balloon dilation 10/19/2011    Procedure: BALLOON DILATION;  Surgeon: Rachael Fee, MD;  Location: [facility] ENDOSCOPY;  Service: Endoscopy;  Laterality: N/A;  . Esophagogastroduodenoscopy 11/07/2011    Procedure: ESOPHAGOGASTRODUODENOSCOPY (EGD);  Surgeon: Meryl Dare, MD,FACG;  Location: [facility] ENDOSCOPY;  Service: Endoscopy;  Laterality: N/A;    Family History  Problem Relation Age of Onset  . Cancer Mother     cervical  . Heart disease Father   . Coronary artery disease    . Hypotension Sister     Allergies  Allergen Reactions  . Erythromycin Ethylsuccinate Other (See Comments)    REACTION: unspecified  . Prednisone Other (See Comments)    REACTION: unspecified    Current Outpatient Prescriptions on File Prior to Visit  Medication Sig Dispense Refill  . albuterol (PROVENTIL HFA;VENTOLIN HFA) 108 (90 BASE) MCG/ACT inhaler Inhale 2 puffs into the lungs every 6 (six) hours as needed. For wheezing      .  B Complex-C-Min-Fe-FA (HEMOCYTE PLUS) 106-1 MG CAPS Take 106 mg by mouth 2 (two) times daily.  180 each  3  . bisoprolol-hydrochlorothiazide (ZIAC) 2.5-6.25 MG per tablet Take 1 tablet by mouth daily.  90 tablet  3  . Calcium-Vitamin D-Vitamin K (VIACTIV) 500-100-40 MG-UNT-MCG CHEW Chew by mouth 3 (three) times daily.        . feeding supplement (RESOURCE BREEZE) LIQD Take 1 Container by mouth 2 (two) times daily between meals.  240 mL  0  . lansoprazole (PREVACID SOLUTAB) 30 MG disintegrating tablet Take 2 tablets  (60 mg total) by mouth 2 (two) times daily.  360 tablet  3  . levocetirizine (XYZAL) 5 MG tablet Take 5 mg by mouth every evening.       . ondansetron (ZOFRAN) 4 MG tablet Take 1 tablet (4 mg total) by mouth 3 (three) times daily before meals.  42 tablet  0  . promethazine (PHENERGAN) 25 MG suppository Place 25 mg rectally every 6 (six) hours as needed. For nausea      . sucralfate (CARAFATE) 1 GM/10ML suspension Take 10 mLs (1 g total) by mouth 2 (two) times daily before a meal.  3600 mL  3  . Vitamin D, Ergocalciferol, (DRISDOL) 50000 UNITS CAPS Take 50,000 Units by mouth every 7 (seven) days. On Friday      . zolpidem (AMBIEN) 5 MG tablet Take 1 tablet (5 mg total) by mouth at bedtime as needed for sleep.  90 tablet  1  . DISCONTD: metoCLOPramide (REGLAN) 5 MG tablet Take 1 tablet (5 mg total) by mouth 2 (two) times daily with breakfast and lunch.  180 tablet  3    BP 110/60  Pulse 72  Temp 98.2 F (36.8 C)  Resp 16  Ht 5\' 3"  (1.6 m)  Wt 117 lb (53.071 kg)  BMI 20.73 kg/m2       Objective:   Physical Exam  Nursing note and vitals reviewed. Constitutional: She is oriented to person, place, and time. No distress.       Pale frail-appearing  HENT:  Head: Normocephalic and atraumatic.  Right Ear: External ear normal.  Left Ear: External ear normal.  Nose: Nose normal.  Mouth/Throat: Oropharynx is clear and moist.  Eyes: Conjunctivae and EOM are normal. Pupils are equal, round, and reactive to light.  Neck: Normal range of motion. Neck supple. No JVD present. No tracheal deviation present. No thyromegaly present.  Cardiovascular: Normal rate, regular rhythm, normal heart sounds and intact distal pulses.   No murmur heard. Pulmonary/Chest: Effort normal and breath sounds normal. She has no wheezes. She exhibits no tenderness.  Abdominal: Soft. Bowel sounds are normal. She exhibits distension. There is tenderness. There is no rebound and no guarding.  Musculoskeletal: Normal range  of motion. She exhibits no edema and no tenderness.  Lymphadenopathy:    She has no cervical adenopathy.  Neurological: She is alert and oriented to person, place, and time. She has normal reflexes. No cranial nerve deficit.  Skin: Skin is warm and dry. She is not diaphoretic.  Psychiatric: She has a normal mood and affect. Her behavior is normal.          Assessment & Plan:  For the nausea and reflux we will increase the Reglan slightly from 5 mg twice a day to 5 mg 3 times a day I have recommended that she decrease the amount of liquid she takes to 1 ounce every 15 minutes to aid in the nausea and  reflux.  We will monitor her potassium level today and adjust as necessary she received a significant potassium replacement the hospital.  She has followup with gastroenterology within the next few weeks but she will keep [place] informed of the nausea is persistent with the increased Reglan dose

## 2011-11-16 NOTE — Patient Instructions (Addendum)
We will increase the Reglan slightly to aid with the nausea. Take the replacement shakes very slowly you should try drinking 1 ounce every 15 minutes rather than trying to split it into quarters.

## 2011-11-17 ENCOUNTER — Telehealth: Payer: Self-pay | Admitting: Gastroenterology

## 2011-11-17 ENCOUNTER — Emergency Department (HOSPITAL_COMMUNITY): Payer: Medicare Other

## 2011-11-17 ENCOUNTER — Telehealth: Payer: Self-pay | Admitting: Internal Medicine

## 2011-11-17 ENCOUNTER — Telehealth: Payer: Self-pay | Admitting: *Deleted

## 2011-11-17 ENCOUNTER — Encounter (HOSPITAL_COMMUNITY): Payer: Self-pay | Admitting: *Deleted

## 2011-11-17 ENCOUNTER — Inpatient Hospital Stay (HOSPITAL_COMMUNITY)
Admission: EM | Admit: 2011-11-17 | Discharge: 2011-11-30 | DRG: 380 | Disposition: A | Discharge status: Skilled Nursing Facility | Payer: Medicare Other | Attending: Internal Medicine | Admitting: Internal Medicine

## 2011-11-17 DIAGNOSIS — I10 Essential (primary) hypertension: Secondary | ICD-10-CM

## 2011-11-17 DIAGNOSIS — J029 Acute pharyngitis, unspecified: Secondary | ICD-10-CM | POA: Diagnosis not present

## 2011-11-17 DIAGNOSIS — J690 Pneumonitis due to inhalation of food and vomit: Secondary | ICD-10-CM | POA: Diagnosis not present

## 2011-11-17 DIAGNOSIS — I4891 Unspecified atrial fibrillation: Secondary | ICD-10-CM

## 2011-11-17 DIAGNOSIS — K30 Functional dyspepsia: Secondary | ICD-10-CM

## 2011-11-17 DIAGNOSIS — E876 Hypokalemia: Secondary | ICD-10-CM

## 2011-11-17 DIAGNOSIS — K222 Esophageal obstruction: Secondary | ICD-10-CM

## 2011-11-17 DIAGNOSIS — R1115 Cyclical vomiting syndrome unrelated to migraine: Secondary | ICD-10-CM | POA: Diagnosis present

## 2011-11-17 DIAGNOSIS — R0902 Hypoxemia: Secondary | ICD-10-CM | POA: Diagnosis not present

## 2011-11-17 DIAGNOSIS — R197 Diarrhea, unspecified: Secondary | ICD-10-CM | POA: Diagnosis present

## 2011-11-17 DIAGNOSIS — J45909 Unspecified asthma, uncomplicated: Secondary | ICD-10-CM

## 2011-11-17 DIAGNOSIS — K219 Gastro-esophageal reflux disease without esophagitis: Secondary | ICD-10-CM

## 2011-11-17 DIAGNOSIS — R1110 Vomiting, unspecified: Secondary | ICD-10-CM

## 2011-11-17 DIAGNOSIS — Z8501 Personal history of malignant neoplasm of esophagus: Secondary | ICD-10-CM

## 2011-11-17 DIAGNOSIS — K5900 Constipation, unspecified: Secondary | ICD-10-CM | POA: Diagnosis present

## 2011-11-17 DIAGNOSIS — K311 Adult hypertrophic pyloric stenosis: Principal | ICD-10-CM

## 2011-11-17 DIAGNOSIS — E43 Unspecified severe protein-calorie malnutrition: Secondary | ICD-10-CM

## 2011-11-17 DIAGNOSIS — D509 Iron deficiency anemia, unspecified: Secondary | ICD-10-CM

## 2011-11-17 HISTORY — DX: Other specified postprocedural states: Z98.890

## 2011-11-17 HISTORY — DX: Other specified postprocedural states: R11.2

## 2011-11-17 LAB — COMPREHENSIVE METABOLIC PANEL
ALT: 7 U/L (ref 0–35)
AST: 16 U/L (ref 0–37)
Albumin: 3.5 g/dL (ref 3.5–5.2)
Alkaline Phosphatase: 58 U/L (ref 39–117)
BUN: 10 mg/dL (ref 6–23)
CO2: 30 mEq/L (ref 19–32)
Calcium: 9.4 mg/dL (ref 8.4–10.5)
Chloride: 99 mEq/L (ref 96–112)
Creatinine, Ser: 0.74 mg/dL (ref 0.50–1.10)
GFR calc Af Amer: >90 mL/min (ref >90)
GFR calc non Af Amer: 78 mL/min — ABNORMAL LOW (ref >90)
Glucose, Bld: 105 mg/dL — ABNORMAL HIGH (ref 70–99)
Potassium: 3.6 mEq/L (ref 3.5–5.1)
Sodium: 138 mEq/L (ref 135–145)
Total Bilirubin: 0.5 mg/dL (ref 0.3–1.2)
Total Protein: 6.2 g/dL (ref 6.0–8.3)

## 2011-11-17 LAB — BASIC METABOLIC PANEL
BUN: 8 mg/dL (ref 6–23)
CO2: 31 mEq/L (ref 19–32)
Calcium: 9.9 mg/dL (ref 8.4–10.5)
Chloride: 100 mEq/L (ref 96–112)
Creatinine, Ser: 0.8 mg/dL (ref 0.4–1.2)
GFR: 69.23 mL/min (ref >60.00)
Glucose, Bld: 100 mg/dL — ABNORMAL HIGH (ref 70–99)
Potassium: 4 mEq/L (ref 3.5–5.1)
Sodium: 141 mEq/L (ref 135–145)

## 2011-11-17 LAB — CBC
HCT: 42.4 % (ref 36.0–46.0)
Hemoglobin: 13.8 g/dL (ref 12.0–15.0)
MCH: 28.3 pg (ref 26.0–34.0)
MCHC: 32.5 g/dL (ref 30.0–36.0)
MCV: 86.9 fL (ref 78.0–100.0)
Platelets: 262 10*3/uL (ref 150–400)
RBC: 4.88 MIL/uL (ref 3.87–5.11)
RDW: 14.7 % (ref 11.5–15.5)
WBC: 5.8 10*3/uL (ref 4.0–10.5)

## 2011-11-17 LAB — PROTIME-INR
INR: 1.1 (ref 0.00–1.49)
Prothrombin Time: 14.4 seconds (ref 11.6–15.2)

## 2011-11-17 LAB — DIFFERENTIAL
Basophils Absolute: 0 10*3/uL (ref 0.0–0.1)
Basophils Relative: 0 % (ref 0–1)
Eosinophils Absolute: 0.1 10*3/uL (ref 0.0–0.7)
Eosinophils Relative: 1 % (ref 0–5)
Lymphocytes Relative: 36 % (ref 12–46)
Lymphs Abs: 2.1 10*3/uL (ref 0.7–4.0)
Monocytes Absolute: 0.7 10*3/uL (ref 0.1–1.0)
Monocytes Relative: 13 % — ABNORMAL HIGH (ref 3–12)
Neutro Abs: 2.9 10*3/uL (ref 1.7–7.7)
Neutrophils Relative %: 50 % (ref 43–77)

## 2011-11-17 LAB — URINALYSIS, ROUTINE W REFLEX MICROSCOPIC
Glucose, UA: NEGATIVE mg/dL
Hgb urine dipstick: NEGATIVE
Specific Gravity, Urine: 1.014 (ref 1.005–1.030)
Urobilinogen, UA: 0.2 mg/dL (ref 0.0–1.0)

## 2011-11-17 LAB — LIPASE, BLOOD: Lipase: 29 U/L (ref 11–59)

## 2011-11-17 LAB — MAGNESIUM: Magnesium: 2 mg/dL (ref 1.5–2.5)

## 2011-11-17 MED ORDER — ALBUTEROL SULFATE HFA 108 (90 BASE) MCG/ACT IN AERS
2.0000 | INHALATION_SPRAY | Freq: Four times a day (QID) | RESPIRATORY_TRACT | Status: DC | PRN
  Filled 2011-11-17: qty 6.7

## 2011-11-17 MED ORDER — BISOPROLOL-HYDROCHLOROTHIAZIDE 2.5-6.25 MG PO TABS
1.0000 | ORAL_TABLET | Freq: Every day | ORAL | Status: DC
  Administered 2011-11-19 – 2011-11-26 (×7): 1 via ORAL
  Filled 2011-11-17 (×9): qty 1

## 2011-11-17 MED ORDER — HEMOCYTE PLUS 106-1 MG PO CAPS
106.0000 mg | ORAL_CAPSULE | Freq: Two times a day (BID) | ORAL | Status: DC
  Administered 2011-11-19: 106 mg via ORAL
  Administered 2011-11-20 – 2011-11-21 (×2): via ORAL
  Administered 2011-11-23: 106 mg via ORAL
  Filled 2011-11-17 (×6): qty 1

## 2011-11-17 MED ORDER — SODIUM CHLORIDE 0.9 % IV SOLN
Freq: Once | INTRAVENOUS | Status: AC | PRN
  Administered 2011-11-17: 13:00:00 via INTRAVENOUS

## 2011-11-17 MED ORDER — ZOLPIDEM TARTRATE 5 MG PO TABS
5.0000 mg | ORAL_TABLET | Freq: Every evening | ORAL | Status: DC | PRN
  Administered 2011-11-17 – 2011-11-20 (×4): 5 mg via ORAL
  Filled 2011-11-17 (×4): qty 1

## 2011-11-17 MED ORDER — ONDANSETRON HCL 4 MG/2ML IJ SOLN
4.0000 mg | Freq: Four times a day (QID) | INTRAMUSCULAR | Status: DC | PRN
  Administered 2011-11-19 – 2011-11-28 (×9): 4 mg via INTRAVENOUS
  Filled 2011-11-17 (×10): qty 2

## 2011-11-17 MED ORDER — SUCRALFATE 1 GM/10ML PO SUSP
1.0000 g | Freq: Two times a day (BID) | ORAL | Status: DC
  Administered 2011-11-18 – 2011-11-30 (×21): 1 g via ORAL
  Filled 2011-11-17 (×28): qty 10

## 2011-11-17 MED ORDER — BIOTENE DRY MOUTH MT LIQD
15.0000 mL | Freq: Two times a day (BID) | OROMUCOSAL | Status: DC
  Administered 2011-11-17 – 2011-11-30 (×23): 15 mL via OROMUCOSAL

## 2011-11-17 MED ORDER — LORATADINE 10 MG PO TABS
5.0000 mg | ORAL_TABLET | Freq: Every evening | ORAL | Status: DC
  Administered 2011-11-17 – 2011-11-25 (×7): 5 mg via ORAL
  Administered 2011-11-26: 18:00:00 via ORAL
  Administered 2011-11-27 – 2011-11-29 (×3): 5 mg via ORAL
  Filled 2011-11-17 (×14): qty 0.5

## 2011-11-17 MED ORDER — PROMETHAZINE HCL 25 MG RE SUPP: 25.0000 mg | Freq: Four times a day (QID) | RECTAL | Status: DC | PRN

## 2011-11-17 MED ORDER — SODIUM CHLORIDE 0.9 % IV SOLN
INTRAVENOUS | Status: DC
  Administered 2011-11-17 – 2011-11-22 (×7): via INTRAVENOUS
  Administered 2011-11-22: 1000 mL via INTRAVENOUS
  Administered 2011-11-23 – 2011-11-26 (×5): via INTRAVENOUS

## 2011-11-17 MED ORDER — PROMETHAZINE HCL 25 MG PO TABS: 12.5000 mg | ORAL_TABLET | Freq: Four times a day (QID) | ORAL | Status: DC | PRN

## 2011-11-17 MED ORDER — LIDOCAINE VISCOUS 2 % MT SOLN
20.0000 mL | Freq: Once | OROMUCOSAL | Status: AC
  Administered 2011-11-17: 20 mL via OROMUCOSAL
  Filled 2011-11-17: qty 20

## 2011-11-17 MED ORDER — VITAMIN D (ERGOCALCIFEROL) 1.25 MG (50000 UNIT) PO CAPS
50000.0000 [IU] | ORAL_CAPSULE | ORAL | Status: DC
  Administered 2011-11-23 – 2011-11-30 (×2): 50000 [IU] via ORAL
  Filled 2011-11-17 (×2): qty 1

## 2011-11-17 MED ORDER — PANTOPRAZOLE SODIUM 40 MG PO TBEC
80.0000 mg | DELAYED_RELEASE_TABLET | Freq: Two times a day (BID) | ORAL | Status: DC
  Administered 2011-11-18 – 2011-11-21 (×8): 80 mg via ORAL
  Filled 2011-11-17 (×10): qty 2

## 2011-11-17 MED ORDER — LANSOPRAZOLE 30 MG PO TBDP: 60.0000 mg | ORAL_TABLET | Freq: Two times a day (BID) | ORAL | Status: DC

## 2011-11-17 MED ORDER — CALCIUM-VITAMIN D-VITAMIN K 500-100-40 MG-UNT-MCG PO CHEW
1.0000 | CHEWABLE_TABLET | Freq: Three times a day (TID) | ORAL | Status: DC
  Administered 2011-11-18: 1 via ORAL
  Filled 2011-11-17 (×2): qty 1

## 2011-11-17 MED ORDER — DOCUSATE SODIUM 100 MG PO CAPS
100.0000 mg | ORAL_CAPSULE | Freq: Two times a day (BID) | ORAL | Status: DC
  Administered 2011-11-17 – 2011-11-26 (×14): 100 mg via ORAL
  Filled 2011-11-17 (×19): qty 1

## 2011-11-17 MED ORDER — ACETAMINOPHEN 325 MG PO TABS: 650.0000 mg | ORAL_TABLET | Freq: Four times a day (QID) | ORAL | Status: DC | PRN

## 2011-11-17 MED ORDER — PROMETHAZINE HCL 25 MG/ML IJ SOLN
12.5000 mg | INTRAMUSCULAR | Status: DC | PRN
  Administered 2011-11-19 – 2011-11-25 (×9): 12.5 mg via INTRAVENOUS
  Filled 2011-11-17 (×10): qty 1

## 2011-11-17 MED ORDER — PROMETHAZINE HCL 25 MG/ML IJ SOLN: 12.5000 mg | INTRAMUSCULAR | Status: DC | PRN

## 2011-11-17 MED ORDER — ACETAMINOPHEN 650 MG RE SUPP
650.0000 mg | Freq: Four times a day (QID) | RECTAL | Status: DC | PRN
  Administered 2011-11-20 – 2011-11-23 (×2): 650 mg via RECTAL
  Filled 2011-11-17 (×2): qty 1

## 2011-11-17 MED ORDER — ONDANSETRON HCL 4 MG PO TABS: 4.0000 mg | ORAL_TABLET | Freq: Three times a day (TID) | ORAL | Status: DC

## 2011-11-17 MED ORDER — ONDANSETRON HCL 4 MG/2ML IJ SOLN
4.0000 mg | Freq: Once | INTRAMUSCULAR | Status: AC
  Administered 2011-11-17: 4 mg via INTRAVENOUS
  Filled 2011-11-17: qty 2

## 2011-11-17 MED ORDER — LEVOCETIRIZINE DIHYDROCHLORIDE 5 MG PO TABS: 5.0000 mg | ORAL_TABLET | Freq: Every evening | ORAL | Status: DC

## 2011-11-17 MED ORDER — BISACODYL 10 MG RE SUPP
10.0000 mg | Freq: Every day | RECTAL | Status: DC | PRN
Start: 2011-11-17 — End: 2011-11-26
  Administered 2011-11-20: 10 mg via RECTAL
  Filled 2011-11-17: qty 1

## 2011-11-17 MED ORDER — METOCLOPRAMIDE HCL 5 MG PO TABS
5.0000 mg | ORAL_TABLET | Freq: Three times a day (TID) | ORAL | Status: DC
  Administered 2011-11-18 – 2011-11-30 (×29): 5 mg via ORAL
  Filled 2011-11-17 (×40): qty 1

## 2011-11-17 NOTE — ED Notes (Signed)
ONG:[id number]<WU> Expected date:11/17/11<BR> Expected time:10:27 AM<BR> Means of arrival:Ambulance<BR> Comments:<BR> Elderly/abd pain

## 2011-11-17 NOTE — Telephone Encounter (Signed)
Pt sister is very concerned about they situation and is requesting to be called  With any appt information for her sister so she can make sure her sister is in the right place at the right time  [name] [phone]

## 2011-11-17 NOTE — ED Notes (Signed)
Pt's sister called back and spoke with this RN. Sister upset about pt's possible discharge. Reassured sister that we are working on getting pt admitted.

## 2011-11-17 NOTE — Telephone Encounter (Signed)
Pt was admitted to [facility] per sister phyllis by Dr Mia Creek

## 2011-11-17 NOTE — ED Notes (Signed)
Pt c/o nausea and vomiting that began last night. Pt states PCP prescribed Breeze shake to increase weight - breeze contributing to n/v symptoms.

## 2011-11-17 NOTE — ED Notes (Signed)
[place]  Sister   [phone],  Cell [phone]  Call if needed

## 2011-11-17 NOTE — ED Notes (Signed)
Called pt's sister to arrange a ride for pt's discharge - Sister does not want pt to be discharged yet, believes she is still too sick. MD notified and will call pt's sister

## 2011-11-17 NOTE — Telephone Encounter (Signed)
Pt. Vomiting all night last night, and wants to know if Dr. Lovell Sheehan can set up a way she can go somewhere to get fluids.

## 2011-11-17 NOTE — Telephone Encounter (Signed)
Per Dr Christella Hartigan, called pt to inform her Dr Christella Hartigan would like for her to be transported by non emergent EMS to [facility] ER. Pt stated she has trownupa ll night and when this happens, her K+ gets very low. Called [facility] who will transport pt. Notified pt she needs to go to [facility] ER and be evaluated by a hospitalist; pt stated understanding.

## 2011-11-17 NOTE — Discharge Instructions (Signed)
Please be sure to speak with Dr. Christella Hartigan as soon as possible to discuss further therapy.  If you develop any new, or concerning changes in your condition, please be sure to return to the ED for additional evaluation.

## 2011-11-17 NOTE — Telephone Encounter (Signed)
Dr Lovell Sheehan talked with dr Christella Hartigan and gi will direct admit pt--gave pt infor and sister also

## 2011-11-17 NOTE — ED Provider Notes (Addendum)
History     CSN: [id number]  Arrival date & time 11/17/11  1146   First MD Initiated Contact with Patient 11/17/11 1218      Chief Complaint  Patient presents with  . Nausea  . Emesis  . Abdominal Pain    (Consider location/radiation/quality/duration/timing/severity/associated sxs/prior treatment) HPI This patient with a distant history of esophageal carcinoma with gastric pull-through now presents with anorexia, nausea, abdominal pain. , The patient was seen for this complaint last month, and admitted for one night for gastric: Obstruction.  She notes that since discharge she has not had endoscopy.  She has had anorexia, weight loss, persistent epigastric discomfort.  Discomfort is worse with by mouth intake, and she has postprandial nausea with emesis.  No diarrhea.  Last bowel movement within the past 24 hours, normal.  No fevers, no chills, no cough, no dyspnea. Past Medical History  Diagnosis Date  . GERD (gastroesophageal reflux disease)   . Osteoporosis   . Postmenopausal HRT (hormone replacement therapy)   . Esophageal cancer   . Anemia   . Hypertension   . Allergy   . Asthma   . Shortness of breath   . Pneumonia     hx of pneumonia    Past Surgical History  Procedure Date  . Left oophorectomy   . Esophageal removal of cancer,gastric ppull-thru 1993   . Cholecystectomy   . Rt arm fx   . Oophorectomy   . Esophagogastroduodenoscopy 10/19/2011    Procedure: ESOPHAGOGASTRODUODENOSCOPY (EGD);  Surgeon: Rachael Fee, MD;  Location: [facility];  Service: Endoscopy;  Laterality: N/A;  . Balloon dilation 10/19/2011    Procedure: BALLOON DILATION;  Surgeon: Rachael Fee, MD;  Location: [facility] ENDOSCOPY;  Service: Endoscopy;  Laterality: N/A;  . Esophagogastroduodenoscopy 11/07/2011    Procedure: ESOPHAGOGASTRODUODENOSCOPY (EGD);  Surgeon: Meryl Dare, MD,FACG;  Location: [facility] ENDOSCOPY;  Service: Endoscopy;  Laterality: N/A;    Family History  Problem Relation Age  of Onset  . Cancer Mother     cervical  . Heart disease Father   . Coronary artery disease    . Hypotension Sister     History  Substance Use Topics  . Smoking status: Never Smoker   . Smokeless tobacco: Never Used  . Alcohol Use: No    OB History    Grav Para Term Preterm Abortions TAB SAB Ect Mult Living                  Review of Systems  Constitutional:       HPI  HENT:       HPI otherwise negative  Eyes: Negative.   Respiratory:       HPI, otherwise negative  Cardiovascular:       HPI, otherwise nmegative  Gastrointestinal: Positive for nausea and vomiting. Negative for diarrhea and constipation.  Genitourinary:       HPI, otherwise negative  Musculoskeletal:       HPI, otherwise negative  Skin: Negative.   Neurological: Negative for syncope.    Allergies  Erythromycin ethylsuccinate and Prednisone  Home Medications   Current Outpatient Rx  Name Route Sig Dispense Refill  . ALBUTEROL SULFATE HFA 108 (90 BASE) MCG/ACT IN AERS Inhalation Inhale 2 puffs into the lungs every 6 (six) hours as needed. For wheezing    . HEMOCYTE PLUS 106-1 MG PO CAPS Oral Take 106 mg by mouth 2 (two) times daily. 180 each 3  . BISOPROLOL-HYDROCHLOROTHIAZIDE 2.5-6.25 MG PO TABS Oral Take  1 tablet by mouth daily. 90 tablet 3  . CALCIUM-VITAMIN D-VITAMIN K 500-100-40 MG-UNT-MCG PO CHEW Oral Chew by mouth 3 (three) times daily.      . RESOURCE BREEZE PO LIQD Oral Take 1 Container by mouth 2 (two) times daily between meals. 240 mL 0  . LANSOPRAZOLE 30 MG PO TBDP Oral Take 2 tablets (60 mg total) by mouth 2 (two) times daily. 360 tablet 3  . LEVOCETIRIZINE DIHYDROCHLORIDE 5 MG PO TABS Oral Take 5 mg by mouth every evening.     Marland Kitchen METOCLOPRAMIDE HCL 5 MG PO TABS Oral Take 1 tablet (5 mg total) by mouth 3 (three) times daily before meals. 180 tablet 3  . ONDANSETRON HCL 4 MG PO TABS Oral Take 1 tablet (4 mg total) by mouth 3 (three) times daily before meals. 42 tablet 0  . PROMETHAZINE  HCL 25 MG RE SUPP Rectal Place 25 mg rectally every 6 (six) hours as needed. For nausea    . SUCRALFATE 1 GM/10ML PO SUSP Oral Take 10 mLs (1 g total) by mouth 2 (two) times daily before a meal. 3600 mL 3  . VITAMIN D (ERGOCALCIFEROL) 50000 UNITS PO CAPS Oral Take 50,000 Units by mouth every 7 (seven) days. On Friday    . ZOLPIDEM TARTRATE 5 MG PO TABS Oral Take 1 tablet (5 mg total) by mouth at bedtime as needed for sleep. 90 tablet 1    BP 132/80  Pulse 65  Temp(Src) 97.4 F (36.3 C) (Oral)  Resp 16  Ht 5\' 3"  (1.6 m)  Wt 117 lb (53.071 kg)  BMI 20.73 kg/m2  SpO2 97%  Physical Exam  Nursing note and vitals reviewed. Constitutional: She is oriented to person, place, and time. She appears well-developed and well-nourished. No distress.  HENT:  Head: Normocephalic and atraumatic.  Eyes: Conjunctivae and EOM are normal.  Cardiovascular: Normal rate and regular rhythm.   Pulmonary/Chest: Effort normal and breath sounds normal. No stridor. No respiratory distress.  Abdominal: She exhibits no distension.  Musculoskeletal: She exhibits no edema.  Neurological: She is alert and oriented to person, place, and time. No cranial nerve deficit.  Skin: Skin is warm and dry.  Psychiatric: She has a normal mood and affect.    ED Course  Procedures (including critical care time)  Labs Reviewed  DIFFERENTIAL - Abnormal; Notable for the following:    Monocytes Relative 13 (*)    All other components within normal limits  CBC  COMPREHENSIVE METABOLIC PANEL  LIPASE, BLOOD  URINALYSIS, ROUTINE W REFLEX MICROSCOPIC   No results found.   No diagnosis found.   Pulse oximetry 100% room air normal  XR reviewed by me  MDM  This elderly female with history of esophageal cancer, no gastric outlet obstruction presents with abdominal pain, anorexia.  On exam the patient is in no distress, with mild tenderness to palpation about her epigastrium.  The patient's labs and x-ray are reassuring for  absence of acute pathology, such as bowel obstruction, and more c/w progression of her known disease (gastric outlet obstruction).  Following the return of the patient's labs, she was reevaluated, and for sleeping, seemingly comfortably.  However, given the patient's inability to tolerate by mouth, she will be admitted for further evaluation and management.  I spoke with both the gastroenterologists and the hospitalists regarding this admission.   Gerhard Munch, MD 11/17/11 1411  Gerhard Munch, MD 11/17/11 1550

## 2011-11-17 NOTE — Consult Note (Signed)
[name]  Referring Provider: [facility]. Primary Care Physician:  Carrie Mew, MD, MD Primary Gastroenterologist:   Wendall Papa. MD Reason for [name]:  Vomiting  HPI: [name] is a 78 y.o. female with a remote history of esophageal cancer, s/p esophagectomy with gastric pull through. She was seen in the office mid March with intermittent nausea, vomiting, chest pain and recent development of hematemesis. Dr. Christella Hartigan did an EGD 3/20 which showed her anastomosis to be at 17cm. There was some stenosis at the anastomosis which Dr. Christella Hartigan dilated. She also had gastritis, biopsy negative for h.pylori. Post-procedure patient was started on Carafate and she did well until she had recurrent symptoms requiring hospitalization two weeks ago at [facility]. CTscan of chest that admission showed marked distention of intrathoracic stomach with fluid suspicious for. gastric outlet obstruction. There was mass effect on the heart and mediastinal structures. Patient's symptoms totally resolved after NGT decompression. An UGI series revealed slow procession of barium from the intrathoracic stomach into the small bowel. No areas of narrowing were seen. Patient improved on Reglan and was discharged home. Since discharge she has had difficulty keeping most things down. Yogurt is okay but anything more substantial causes nausea and vomiting and chest pain. Patient came to ED for further evaluation. Her labs look okay. Acute abdominal series unremarkable. (CXR included). Patient actually feels better without any vomiting since being in ED. Family members describe intermittent nausea and vomiting for years with definite progression over the last year. She has been treated several times for PNA (?aspiration) in the last year.   Past Medical History  Diagnosis Date  . GERD (gastroesophageal reflux disease)   . Osteoporosis   . Postmenopausal HRT (hormone replacement therapy)     . Esophageal cancer   . Anemia   . Hypertension   . Allergy   . Asthma   . Shortness of breath   . Pneumonia     hx of pneumonia    Past Surgical History  Procedure Date  . Left oophorectomy   . Esophageal removal of cancer,gastric ppull-thru 1993   . Cholecystectomy   . Rt arm fx   . Oophorectomy   . Esophagogastroduodenoscopy 10/19/2011    Procedure: ESOPHAGOGASTRODUODENOSCOPY (EGD);  Surgeon: Rachael Fee, MD;  Location: [facility];  Service: Endoscopy;  Laterality: N/A;  . Balloon dilation 10/19/2011    Procedure: BALLOON DILATION;  Surgeon: Rachael Fee, MD;  Location: [facility] ENDOSCOPY;  Service: Endoscopy;  Laterality: N/A;  . Esophagogastroduodenoscopy 11/07/2011    Procedure: ESOPHAGOGASTRODUODENOSCOPY (EGD);  Surgeon: Meryl Dare, MD,FACG;  Location: [facility] ENDOSCOPY;  Service: Endoscopy;  Laterality: N/A;    Prior to Admission medications   Medication Sig Start Date End Date Taking? Authorizing Provider  albuterol (PROVENTIL HFA;VENTOLIN HFA) 108 (90 BASE) MCG/ACT inhaler Inhale 2 puffs into the lungs every 6 (six) hours as needed. For wheezing   Yes Historical Provider, MD  B Complex-C-Min-Fe-FA (HEMOCYTE PLUS) 106-1 MG CAPS Take 106 mg by mouth 2 (two) times daily. 08/31/11  Yes Stacie Glaze, MD  bisoprolol-hydrochlorothiazide [facility]) 2.5-6.25 MG per tablet Take 1 tablet by mouth daily. 05/27/11 05/26/12 Yes Stacie Glaze, MD  Calcium-Vitamin D-Vitamin K (VIACTIV) 500-100-40 MG-UNT-MCG CHEW Chew by mouth 3 (three) times daily.     Yes Historical Provider, MD  feeding supplement (RESOURCE BREEZE) LIQD Take 1 Container by mouth 2 (two) times daily between meals. 11/10/11  Yes Shanker Levora Dredge, MD  lansoprazole (PREVACID SOLUTAB) 30 MG disintegrating tablet  Take 2 tablets (60 mg total) by mouth 2 (two) times daily. 07/04/11  Yes Rachael Fee, MD  levocetirizine (XYZAL) 5 MG tablet Take 5 mg by mouth every evening. Patient takes only as needed   Yes Historical Provider,  MD  metoCLOPramide (REGLAN) 5 MG tablet Take 1 tablet (5 mg total) by mouth 3 (three) times daily before meals. 11/16/11  Yes Stacie Glaze, MD  ondansetron (ZOFRAN) 4 MG tablet Take 1 tablet (4 mg total) by mouth 3 (three) times daily before meals. 11/10/11 11/17/11 Yes Shanker Levora Dredge, MD  promethazine (PHENERGAN) 25 MG suppository Place 25 mg rectally every 6 (six) hours as needed. For nausea   Yes Historical Provider, MD  sucralfate (CARAFATE) 1 GM/10ML suspension Take 10 mLs (1 g total) by mouth 2 (two) times daily before a meal. 11/10/11 11/09/12 Yes Shanker Levora Dredge, MD  Vitamin D, Ergocalciferol, (DRISDOL) 50000 UNITS CAPS Take 50,000 Units by mouth every 7 (seven) days. On Friday   Yes Historical Provider, MD  zolpidem (AMBIEN) 5 MG tablet Take 1 tablet (5 mg total) by mouth at bedtime as needed for sleep. 11/16/11 11/15/12 Yes Stacie Glaze, MD    Current Facility-Administered Medications  Medication Dose Route Frequency Provider Last Rate Last Dose  . 0.9 %  sodium chloride infusion   Intravenous Once PRN Gerhard Munch, MD 10 mL/hr at 11/17/11 1253    . lidocaine (XYLOCAINE) 2 % viscous mouth solution 20 mL  20 mL Mouth/Throat Once Gerhard Munch, MD   20 mL at 11/17/11 1334  . ondansetron (ZOFRAN) injection 4 mg  4 mg Intravenous Once Gerhard Munch, MD   4 mg at 11/17/11 1254   Current Outpatient Prescriptions  Medication Sig Dispense Refill  . albuterol (PROVENTIL HFA;VENTOLIN HFA) 108 (90 BASE) MCG/ACT inhaler Inhale 2 puffs into the lungs every 6 (six) hours as needed. For wheezing      . B Complex-C-Min-Fe-FA (HEMOCYTE PLUS) 106-1 MG CAPS Take 106 mg by mouth 2 (two) times daily.  180 each  3  . bisoprolol-hydrochlorothiazide (ZIAC) 2.5-6.25 MG per tablet Take 1 tablet by mouth daily.  90 tablet  3  . Calcium-Vitamin D-Vitamin K (VIACTIV) 500-100-40 MG-UNT-MCG CHEW Chew by mouth 3 (three) times daily.        . feeding supplement (RESOURCE BREEZE) LIQD Take 1 Container by mouth  2 (two) times daily between meals.  240 mL  0  . lansoprazole (PREVACID SOLUTAB) 30 MG disintegrating tablet Take 2 tablets (60 mg total) by mouth 2 (two) times daily.  360 tablet  3  . levocetirizine (XYZAL) 5 MG tablet Take 5 mg by mouth every evening. Patient takes only as needed      . metoCLOPramide (REGLAN) 5 MG tablet Take 1 tablet (5 mg total) by mouth 3 (three) times daily before meals.  180 tablet  3  . ondansetron (ZOFRAN) 4 MG tablet Take 1 tablet (4 mg total) by mouth 3 (three) times daily before meals.  42 tablet  0  . promethazine (PHENERGAN) 25 MG suppository Place 25 mg rectally every 6 (six) hours as needed. For nausea      . sucralfate (CARAFATE) 1 GM/10ML suspension Take 10 mLs (1 g total) by mouth 2 (two) times daily before a meal.  3600 mL  3  . Vitamin D, Ergocalciferol, (DRISDOL) 50000 UNITS CAPS Take 50,000 Units by mouth every 7 (seven) days. On Friday      . zolpidem (AMBIEN) 5 MG tablet Take 1  tablet (5 mg total) by mouth at bedtime as needed for sleep.  90 tablet  1    Allergies as of 11/17/2011 - Review Complete 11/17/2011  Allergen Reaction Noted  . Erythromycin ethylsuccinate Other (See Comments) 09/26/2006  . Prednisone Other (See Comments) 09/26/2006    Family History  Problem Relation Age of Onset  . Cancer Mother     cervical  . Heart disease Father   . Coronary artery disease    . Hypotension Sister     History   Social History  . Marital Status: Single    Spouse Name: N/A    Number of Children: 1  . Years of Education: N/A   Occupational History  . Retired    Social History Main Topics  . Smoking status: Never Smoker   . Smokeless tobacco: Never Used  . Alcohol Use: No  . Drug Use: No  . Sexually Active: Yes    Social History Narrative   DAILY CAFFEINE USE    Review of Systems: All other systems reviewed and negative except where noted in HPI  PHYSICAL EXAM: Vital signs in last 24 hours: Temp:  [97.4 F (36.3 C)-98.2 F  (36.8 C)] 97.8 F (36.6 C) (04/18 1455) Pulse Rate:  [63-72] 63  (04/18 1455) Resp:  [16-18] 16  (04/18 1455) BP: (107-132)/(60-80) 112/73 mmHg (04/18 1455) SpO2:  [96 %-97 %] 96 % (04/18 1455) Weight:  [117 lb (53.071 kg)] 117 lb (53.071 kg) (04/18 1203)   General:   pleasant white female in NAD Head:  Normocephalic and atraumatic. Eyes:   No icterus.   Conjunctiva pink. Ears:  Normal auditory acuity. Neck:  Supple; no masses felt Lungs:  Respirations even and unlabored. LLQ inspiratory crackles.  No wheezes  Heart:  Regular rate and rhythm Abdomen:  Soft, nondistended, nontender. Normal bowel sounds. No appreciable masses or hepatomegaly.  Rectal:  Not performed.  Msk:  Symmetrical without gross deformities.  Extremities:  Without edema. Neurologic:  Alert and  oriented;  grossly normal neurologically. Skin:  Intact without significant lesions or rashes. Cervical Nodes:  No significant cervical adenopathy. Psych:  Alert and cooperative. Normal affect.  LAB RESULTS:  Basename 11/17/11 1240  WBC 5.8  HGB 13.8  HCT 42.4  PLT 262   BMET  Basename 11/17/11 1240 11/16/11 1620  NA 138 141  K 3.6 4.0  CL 99 100  CO2 30 31  GLUCOSE 105* 100*  BUN 10 8  CREATININE 0.74 0.8  CALCIUM 9.4 9.9   LFT  Basename 11/17/11 1240  PROT 6.2  ALBUMIN 3.5  AST 16  ALT 7  ALKPHOS 58  BILITOT 0.5  BILIDIR --  IBILI --    STUDIES: Dg Abd Acute W/chest  11/17/2011  *RADIOLOGY REPORT*  Clinical Data: Abdominal pain with vomiting and nausea.  Left chest pain and shortness of breath.  ACUTE ABDOMEN SERIES (ABDOMEN 2 VIEW & CHEST 1 VIEW)  Comparison: 11/05/2011.  Findings: Trachea is midline.  Heart size stable.  Surgical clips are seen in the expected location of the thyroid.  Hiatal hernia. Scarring at the lung bases.  Lungs are otherwise clear.  Two views of the abdomen show stool in the majority of the colon. No small bowel dilatation.  IMPRESSION: Constipation.  Original Report  Authenticated By: Reyes Ivan, M.D.    PREVIOUS ENDOSCOPIES: see HPI.  IMPRESSION / PLAN: 7. 78 year old female with remote history of esophageal cancer, s/p esophagectomy with gastric pull through. Now with  progressive nausea, vomiting and post-prandial chest pain over the last year. Distal stomach tortuous but patent on recent EGDs. Patient certainly has delayed emptying of her stomach but query intermittent obstructive process as well. Patient is to be admitted by hospitalist. Dr. Christella Hartigan asked surgery to see (is there any type of revision that can be done in this patient?) 2. History of recurrent PNA, probably aspiration related to #1.  3. History of anastomotic stricture, s/p EGD with dilation 10/19/11. Note, she has a high anastomosis. Her problems with gastric emptying are very distal to where stricture was.    Thanks   LOS: 0 days   Willette Cluster  11/17/2011, 3:12 PM  GI ATTENDING  HX AS ABOVE. RECENT EGD AND X-RAYS REVIEWED. RECURRENT N/V AND PAIN. SHE CERTAINLY HAS SOME IMPAIRMENT OF EMPTYING (PARTIALLY MECHANICAL AND PARTIALLY IMPAIRED MOTILITY FROM NEURAL SACRIFICE / DAMAGE) AS IS TYPICAL POST-OP FOR THIS TYPE OF PROCEDURE. HOWEVER, THERE IS A CLINICAL SUSPICION OF INTERMITTENT HIGHER GRADE MECHANICAL OBSTRUCTION, AS WITH VOLVULUS. DR JACOBS NOW FACILITATING ADMISSION WITH MEDICINE FOR TREATMENT OF ACTIVE SYMPTOMS AS WELL AS A SURGICAL OPINION.  Wilhemina Bonito. Eda Keys., M.D. [facility] Division of Gastroenterology

## 2011-11-17 NOTE — H&P (Signed)
PCP:  Carrie Mew, MD, MD   DOA:  11/17/2011 11:46 AM  Chief Complaint:  Nausea and vomiting  HPI: Patient is a pleasant 78 year old female with multiple medical comorbidities including but not limited to esophageal cancer of mid to proximal region status post resection, hypertension, asthma, esophageal strictures who presented to ED with complaints of intractable nausea and vomiting that has been going on for last 2-3 weeks prior to the admission. Patient had an admission in the past 2-3 months ago for similar problem at which time she had a gastric outlet obstruction and required NG tube. At this time patient has no associated abdominal pain, no diarrhea no blood in the stool or urine. Patient does complain of constipation. No complaints of dysuria, hematuria. No complaints of chest pain, cough or shortness of breath. No complaints of fever or chills.   Assessment/Plan  Principal Problem:   *Vomiting - likely secondary to gastric outlet obstruction - at this time patient has no vomiting and is able to swallow but she does report that resource supplements make her nauseous so will stop them - continue Zofran nausea and phenergan for vomiting - patient is already on Reglan in addition to those medications so while this may a therapeutic duplication this is actually patient home medication regimen  Active Problems:   Gastric outlet obstruction - secondary to resection due to esophageal cancer - we will continue home medication regimen   ANEMIA-IRON DEFICIENCY - hemoglobin stable on admission   HYPERTENSION - continue Bisoprolol/Hctz   GERD - continue prevacid   HX OF ESOPHAGEAL CANCER - status post resection   Asthma with bronchitis - stable on admission - no need for nebulizer treatment at this time - continue albuterol HFA   Esophageal stricture - continue prevacid  DVT Prophylaxis - SCD bilaterally  Code Status - full code  Education  - test results and  diagnostic studies were discussed with patient  - patient has verbalized the understanding - questions were answered at the bedside and contact information was provided for additional questions or concerns    Allergies: Allergies  Allergen Reactions  . Erythromycin Ethylsuccinate Other (See Comments)    REACTION: unspecified  . Prednisone Other (See Comments)    REACTION: unspecified    Prior to Admission medications   Medication Sig Start Date End Date Taking? Authorizing Provider  albuterol (PROVENTIL HFA;VENTOLIN HFA) 108 (90 BASE) MCG/ACT inhaler Inhale 2 puffs into the lungs every 6 (six) hours as needed. For wheezing   Yes Historical Provider, MD  B Complex-C-Min-Fe-FA (HEMOCYTE PLUS) 106-1 MG CAPS Take 106 mg by mouth 2 (two) times daily. 08/31/11  Yes Stacie Glaze, MD  bisoprolol-hydrochlorothiazide [facility]) 2.5-6.25 MG per tablet Take 1 tablet by mouth daily. 05/27/11 05/26/12 Yes Stacie Glaze, MD  Calcium-Vitamin D-Vitamin K (VIACTIV) 500-100-40 MG-UNT-MCG CHEW Chew by mouth 3 (three) times daily.     Yes Historical Provider, MD  feeding supplement (RESOURCE BREEZE) LIQD Take 1 Container by mouth 2 (two) times daily between meals. 11/10/11  Yes Shanker Levora Dredge, MD  lansoprazole (PREVACID SOLUTAB) 30 MG disintegrating tablet Take 2 tablets (60 mg total) by mouth 2 (two) times daily. 07/04/11  Yes Rachael Fee, MD  levocetirizine (XYZAL) 5 MG tablet Take 5 mg by mouth every evening. Patient takes only as needed   Yes Historical Provider, MD  metoCLOPramide (REGLAN) 5 MG tablet Take 1 tablet (5 mg total) by mouth 3 (three) times daily before meals. 11/16/11  Yes Balinda Quails  Lovell Sheehan, MD  ondansetron (ZOFRAN) 4 MG tablet Take 1 tablet (4 mg total) by mouth 3 (three) times daily before meals. 11/10/11 11/17/11 Yes Shanker Levora Dredge, MD  promethazine (PHENERGAN) 25 MG suppository Place 25 mg rectally every 6 (six) hours as needed. For nausea   Yes Historical Provider, MD  sucralfate  (CARAFATE) 1 GM/10ML suspension Take 10 mLs (1 g total) by mouth 2 (two) times daily before a meal. 11/10/11 11/09/12 Yes Shanker Levora Dredge, MD  Vitamin D, Ergocalciferol, (DRISDOL) 50000 UNITS CAPS Take 50,000 Units by mouth every 7 (seven) days. On Friday   Yes Historical Provider, MD  zolpidem (AMBIEN) 5 MG tablet Take 1 tablet (5 mg total) by mouth at bedtime as needed for sleep. 11/16/11 11/15/12 Yes Stacie Glaze, MD    Past Medical History  Diagnosis Date  . GERD (gastroesophageal reflux disease)   . Osteoporosis   . Postmenopausal HRT (hormone replacement therapy)   . Esophageal cancer   . Anemia   . Hypertension   . Allergy   . Asthma   . Shortness of breath   . Pneumonia     hx of pneumonia  . PONV (postoperative nausea and vomiting)     Past Surgical History  Procedure Date  . Left oophorectomy   . Esophageal removal of cancer,gastric ppull-thru 1993   . Cholecystectomy   . Rt arm fx   . Oophorectomy   . Esophagogastroduodenoscopy 10/19/2011    Procedure: ESOPHAGOGASTRODUODENOSCOPY (EGD);  Surgeon: Rachael Fee, MD;  Location: [facility];  Service: Endoscopy;  Laterality: N/A;  . Balloon dilation 10/19/2011    Procedure: BALLOON DILATION;  Surgeon: Rachael Fee, MD;  Location: [facility] ENDOSCOPY;  Service: Endoscopy;  Laterality: N/A;  . Esophagogastroduodenoscopy 11/07/2011    Procedure: ESOPHAGOGASTRODUODENOSCOPY (EGD);  Surgeon: Meryl Dare, MD,FACG;  Location: [facility] ENDOSCOPY;  Service: Endoscopy;  Laterality: N/A;    Social History:  reports that she has never smoked. She has never used smokeless tobacco. She reports that she does not drink alcohol or use illicit drugs.  Family History  Problem Relation Age of Onset  . Cancer Mother     cervical  . Heart disease Father   . Coronary artery disease    . Hypotension Sister     Review of Systems:  Constitutional: Denies fever, chills, diaphoresis, appetite change and fatigue.  HEENT: Denies photophobia, eye  pain, redness, hearing loss, ear pain, congestion, sore throat, rhinorrhea, sneezing, mouth sores, trouble swallowing, neck pain, neck stiffness and tinnitus.   Respiratory: Denies SOB, DOE, cough, chest tightness,  and wheezing.   Cardiovascular: Denies chest pain, palpitations and leg swelling.  Gastrointestinal: per hpi.  Genitourinary: Denies dysuria, urgency, frequency, hematuria, flank pain and difficulty urinating.  Musculoskeletal: Denies myalgias, back pain, joint swelling, arthralgias and gait problem.  Skin: Denies pallor, rash and wound.  Neurological: Denies dizziness, seizures, syncope, weakness, light-headedness, numbness and headaches.  Hematological: Denies adenopathy. Easy bruising, personal or family bleeding history  Psychiatric/Behavioral: Denies suicidal ideation, mood changes, confusion, nervousness, sleep disturbance and agitation   Physical Exam:  Filed Vitals:   11/17/11 1203 11/17/11 1337 11/17/11 1455  BP: 132/80 107/68 112/73  Pulse: 65 69 63  Temp: 97.4 F (36.3 C)  97.8 F (36.6 C)  TempSrc: Oral  Oral  Resp: 16 18 16   Height: 5\' 3"  (1.6 m)    Weight: 53.071 kg (117 lb)    SpO2: 97% 96% 96%    Constitutional: Vital signs reviewed.  Patient  is in no acute distress and cooperative with exam. Alert and oriented x3.  Head: Normocephalic and atraumatic Ear: TM normal bilaterally Mouth: no erythema or exudates, MMM Eyes: PERRL, EOMI, conjunctivae normal, No scleral icterus.  Neck: Supple, Trachea midline normal ROM, No JVD, mass, thyromegaly, or carotid bruit present.  Cardiovascular: RRR, S1 normal, S2 normal, no MRG, pulses symmetric and intact bilaterally Pulmonary/Chest: CTAB, no wheezes, rales, or rhonchi Abdominal: Soft. Non-tender, non-distended, bowel sounds are normal, no masses, organomegaly, or guarding present.  GU: no CVA tenderness Musculoskeletal: No joint deformities, erythema, or stiffness, ROM full and no nontender Ext: no edema and no  cyanosis, pulses palpable bilaterally (DP and PT) Hematology: no cervical, inginal, or axillary adenopathy.  Neurological: A&O x3, Strenght is normal and symmetric bilaterally, cranial nerve II-XII are grossly intact, no focal motor deficit, sensory intact to light touch bilaterally.  Skin: Warm, dry and intact. No rash, cyanosis, or clubbing.  Psychiatric: Normal mood and affect. speech and behavior is normal. Judgment and thought content normal. Cognition and memory are normal.   Labs on Admission:  Results for orders placed during the hospital encounter of 11/17/11 (from the past 48 hour(s))  COMPREHENSIVE METABOLIC PANEL     Status: Abnormal   Collection Time   11/17/11 12:40 PM      Component Value Range Comment   Sodium 138  135 - 145 (mEq/L)    Potassium 3.6  3.5 - 5.1 (mEq/L)    Chloride 99  96 - 112 (mEq/L)    CO2 30  19 - 32 (mEq/L)    Glucose, Bld 105 (*) 70 - 99 (mg/dL)    BUN 10  6 - 23 (mg/dL)    Creatinine, Ser 0.98  0.50 - 1.10 (mg/dL)    Calcium 9.4  8.4 - 10.5 (mg/dL)    Total Protein 6.2  6.0 - 8.3 (g/dL)    Albumin 3.5  3.5 - 5.2 (g/dL)    AST 16  0 - 37 (U/L) SLIGHT HEMOLYSIS   ALT 7  0 - 35 (U/L)    Alkaline Phosphatase 58  39 - 117 (U/L)    Total Bilirubin 0.5  0.3 - 1.2 (mg/dL)    GFR calc non Af Amer 78 (*) >90 (mL/min)    GFR calc Af Amer >90  >90 (mL/min)   CBC     Status: Normal   Collection Time   11/17/11 12:40 PM      Component Value Range Comment   WBC 5.8  4.0 - 10.5 (K/uL)    RBC 4.88  3.87 - 5.11 (MIL/uL)    Hemoglobin 13.8  12.0 - 15.0 (g/dL)    HCT 11.9  14.7 - 82.9 (%)    MCV 86.9  78.0 - 100.0 (fL)    MCH 28.3  26.0 - 34.0 (pg)    MCHC 32.5  30.0 - 36.0 (g/dL)    RDW 56.2  13.0 - 86.5 (%)    Platelets 262  150 - 400 (K/uL)   DIFFERENTIAL     Status: Abnormal   Collection Time   11/17/11 12:40 PM      Component Value Range Comment   Neutrophils Relative 50  43 - 77 (%)    Neutro Abs 2.9  1.7 - 7.7 (K/uL)    Lymphocytes Relative 36  12  - 46 (%)    Lymphs Abs 2.1  0.7 - 4.0 (K/uL)    Monocytes Relative 13 (*) 3 - 12 (%)    Monocytes  Absolute 0.7  0.1 - 1.0 (K/uL)    Eosinophils Relative 1  0 - 5 (%)    Eosinophils Absolute 0.1  0.0 - 0.7 (K/uL)    Basophils Relative 0  0 - 1 (%)    Basophils Absolute 0.0  0.0 - 0.1 (K/uL)   LIPASE, BLOOD     Status: Normal   Collection Time   11/17/11 12:40 PM      Component Value Range Comment   Lipase 29  11 - 59 (U/L)   URINALYSIS, ROUTINE W REFLEX MICROSCOPIC     Status: Normal   Collection Time   11/17/11  1:00 PM      Component Value Range Comment   Color, Urine YELLOW  YELLOW     APPearance CLEAR  CLEAR     Specific Gravity, Urine 1.014  1.005 - 1.030     pH 7.5  5.0 - 8.0     Glucose, UA NEGATIVE  NEGATIVE (mg/dL)    Hgb urine dipstick NEGATIVE  NEGATIVE     Bilirubin Urine NEGATIVE  NEGATIVE     Ketones, ur NEGATIVE  NEGATIVE (mg/dL)    Protein, ur NEGATIVE  NEGATIVE (mg/dL)    Urobilinogen, UA 0.2  0.0 - 1.0 (mg/dL)    Nitrite NEGATIVE  NEGATIVE     Leukocytes, UA NEGATIVE  NEGATIVE  MICROSCOPIC NOT DONE ON URINES WITH NEGATIVE PROTEIN, BLOOD, LEUKOCYTES, NITRITE, OR GLUCOSE <1000 mg/dL.    Radiological Exams on Admission: DG abdomen acute with chest: constipation  Time Spent on Admission: Over 30 minutes  Magalene Mclear 11/17/2011, 4:05 PM  [facility] Pager # [phone] Main Office # [phone]

## 2011-11-17 NOTE — Telephone Encounter (Signed)
I spoke with her sister, Dr. Lovell Sheehan. Lakshmi is doing terribly, vomiting all night. I suspect GOO again. This is probably related to her intrathoracic stomach, perhaps twisting in distal stomach/duodenal area.  She is going to be admitted by [facility], Dr. Elisabeth Pigeon who asked that she be evaluated by ER first to help with triaging.  I have arranged.  I will also let gastro in patient team be aware and will discuss with [facility].

## 2011-11-18 ENCOUNTER — Observation Stay (HOSPITAL_COMMUNITY): Payer: Medicare Other

## 2011-11-18 DIAGNOSIS — K219 Gastro-esophageal reflux disease without esophagitis: Secondary | ICD-10-CM

## 2011-11-18 DIAGNOSIS — K3189 Other diseases of stomach and duodenum: Secondary | ICD-10-CM

## 2011-11-18 DIAGNOSIS — R1110 Vomiting, unspecified: Secondary | ICD-10-CM

## 2011-11-18 DIAGNOSIS — E876 Hypokalemia: Secondary | ICD-10-CM | POA: Diagnosis not present

## 2011-11-18 DIAGNOSIS — K222 Esophageal obstruction: Secondary | ICD-10-CM

## 2011-11-18 DIAGNOSIS — R112 Nausea with vomiting, unspecified: Secondary | ICD-10-CM

## 2011-11-18 DIAGNOSIS — K311 Adult hypertrophic pyloric stenosis: Principal | ICD-10-CM

## 2011-11-18 LAB — COMPREHENSIVE METABOLIC PANEL
ALT: 6 U/L (ref 0–35)
Albumin: 2.9 g/dL — ABNORMAL LOW (ref 3.5–5.2)
Alkaline Phosphatase: 49 U/L (ref 39–117)
Glucose, Bld: 85 mg/dL (ref 70–99)
Potassium: 3 mEq/L — ABNORMAL LOW (ref 3.5–5.1)
Sodium: 140 mEq/L (ref 135–145)
Total Protein: 5 g/dL — ABNORMAL LOW (ref 6.0–8.3)

## 2011-11-18 LAB — CBC
HCT: 37.6 % (ref 36.0–46.0)
MCH: 27.9 pg (ref 26.0–34.0)
MCHC: 31.4 g/dL (ref 30.0–36.0)
MCV: 88.9 fL (ref 78.0–100.0)
RDW: 15 % (ref 11.5–15.5)

## 2011-11-18 MED ORDER — SODIUM CHLORIDE 0.9 % IV SOLN
Freq: Once | INTRAVENOUS | Status: AC
  Administered 2011-11-18: 500 mL via INTRAVENOUS

## 2011-11-18 MED ORDER — BISACODYL 10 MG RE SUPP: 10.0000 mg | Freq: Once | RECTAL | Status: DC

## 2011-11-18 MED ORDER — POTASSIUM CHLORIDE CRYS ER 20 MEQ PO TBCR
40.0000 meq | EXTENDED_RELEASE_TABLET | Freq: Once | ORAL | Status: DC
  Filled 2011-11-18: qty 2

## 2011-11-18 MED ORDER — POTASSIUM CHLORIDE 10 MEQ/100ML IV SOLN
10.0000 meq | INTRAVENOUS | Status: AC
  Administered 2011-11-18 (×4): 10 meq via INTRAVENOUS
  Filled 2011-11-18 (×4): qty 100

## 2011-11-18 NOTE — Consult Note (Signed)
Reason for Consult: Recurrent Nausea/vomiting, with weight loss.  S/P Esophagogastrectomy 1993 Referring Physician: Dr. Rob Bunting Primary care:[name] [name] is an 78 y.o. female.  HPI:  The patient is an 78 year old white female who is status post esophagogastrectomy by Dr. Ofilia Neas, January 1993. She has a long history of reflux, and multiple dilatations dating back to 2005. She's been very active, and functional. She goes to the [facility] on a regular basis and walks. She still drives. Over the last 2 months her reflux has become progressively worse. She has reached the point where she can be very little for developing nausea and vomiting. She was hospitalized a week ago and will underwent NG placement with relief of her symptoms. She improved her diet was advanced. They've attempted to place her on Ensure and Boost, neither of which she can tolerate. Her meals to become progressively smaller an attempt to accommodate her early filling and severe reflux symptoms. All of these have failed she has a history of asthma bronchitis and she's developed aspiration pneumonia several times in the past 2 to her nausea and vomiting. She is currently being seen by Dr. Canary Brim. She is undergone several diagnostic studies including EGD 10/19/2010 an endoscopic balloon dilatation. She had a second EGD 11/07/11. This showed anastomotic changes in the proximal esophagus slight narrowing in the esophagogastric junction at 18 cm. Moderate grass dry this in the body of the stomach which appeared to be rotated to her NG tube. Distal stomach was tortuous or deformed. The pylorus was widely patent duodenal bulb was normal in appearance except for a slight narrowing and no obstruction or extrinsic affect, postsurgical change but  There was  no gastric obstruction. Upper GI on 11/08/2011 show slow progression the barium from the intrathoracic stomach into the small bowel the only potential married visualized  was at the junction of the stomach and the small bowel with a hiatus. But E. than that narrowing was at least 13 mm in diameter. After recent hospitalization she was discharged home but is still unable to tolerate any volume of by mouth nutrition. She was admitted we were asked to see in consultation.  Past Medical History  Diagnosis Date  . Esophageal cancer s/p Esophagogastrectomy Jan 1993 Dr. Sheliah Plane. GERD (gastroesophageal reflux disease) . PONV (postoperative nausea and vomiting) Pneumonia    hx of pneumonia due to aspiration due to reflux       . Marland Kitchen Hypertension Asthma/ . Shortness of breath    Osteoporosis   . Anemia     Past Surgical History  Procedure Date  . Left oophorectomy   . Esophageal removal of cancer,gastric ppull-thru 1993   . Cholecystectomy   . Rt arm fx   . Oophorectomy   . Esophagogastroduodenoscopy 10/19/2011    Procedure: ESOPHAGOGASTRODUODENOSCOPY (EGD);  Surgeon: Rachael Fee, MD;  Location: [facility];  Service: Endoscopy;  Laterality: N/A;  . Balloon dilation 10/19/2011    Procedure: BALLOON DILATION;  Surgeon: Rachael Fee, MD;  Location: [facility] ENDOSCOPY;  Service: Endoscopy;  Laterality: N/A;  . Esophagogastroduodenoscopy 11/07/2011    Procedure: ESOPHAGOGASTRODUODENOSCOPY (EGD);  Surgeon: Meryl Dare, MD,FACG;  Location: [facility] ENDOSCOPY;  Service: Endoscopy;  Laterality: N/A;    Family History  Problem Relation Age of Onset  . Cancer Mother     cervical  . Heart disease Father   . Coronary artery disease    . Hypotension Sister     Social History:  reports that she  has never smoked. She has never used smokeless tobacco. She reports that she does not drink alcohol or use illicit drugs.  Allergies:  Allergies  Allergen Reactions  . Erythromycin Ethylsuccinate Other (See Comments)    REACTION: unspecified  . Prednisone Other (See Comments)    REACTION: unspecified    Medications:  Prior to Admission:  Prescriptions prior to  admission  Medication Sig Dispense Refill  . albuterol (PROVENTIL HFA;VENTOLIN HFA) 108 (90 BASE) MCG/ACT inhaler Inhale 2 puffs into the lungs every 6 (six) hours as needed. For wheezing      . B Complex-C-Min-Fe-FA (HEMOCYTE PLUS) 106-1 MG CAPS Take 106 mg by mouth 2 (two) times daily.  180 each  3  . bisoprolol-hydrochlorothiazide (ZIAC) 2.5-6.25 MG per tablet Take 1 tablet by mouth daily.  90 tablet  3  . Calcium-Vitamin D-Vitamin K (VIACTIV) 500-100-40 MG-UNT-MCG CHEW Chew by mouth 3 (three) times daily.        . feeding supplement (RESOURCE BREEZE) LIQD Take 1 Container by mouth 2 (two) times daily between meals.  240 mL  0  . lansoprazole (PREVACID SOLUTAB) 30 MG disintegrating tablet Take 2 tablets (60 mg total) by mouth 2 (two) times daily.  360 tablet  3  . levocetirizine (XYZAL) 5 MG tablet Take 5 mg by mouth every evening. Patient takes only as needed      . metoCLOPramide (REGLAN) 5 MG tablet Take 1 tablet (5 mg total) by mouth 3 (three) times daily before meals.  180 tablet  3  . ondansetron (ZOFRAN) 4 MG tablet Take 1 tablet (4 mg total) by mouth 3 (three) times daily before meals.  42 tablet  0  . promethazine (PHENERGAN) 25 MG suppository Place 25 mg rectally every 6 (six) hours as needed. For nausea      . sucralfate (CARAFATE) 1 GM/10ML suspension Take 10 mLs (1 g total) by mouth 2 (two) times daily before a meal.  3600 mL  3  . Vitamin D, Ergocalciferol, (DRISDOL) 50000 UNITS CAPS Take 50,000 Units by mouth every 7 (seven) days. On Friday      . zolpidem (AMBIEN) 5 MG tablet Take 1 tablet (5 mg total) by mouth at bedtime as needed for sleep.  90 tablet  1   Scheduled:   . sodium chloride   Intravenous Once  . antiseptic oral rinse  15 mL Mouth Rinse BID  . bisacodyl  10 mg Rectal Once  . bisoprolol-hydrochlorothiazide  1 tablet Oral Daily  . Calcium-Vitamin D-Vitamin K  1 tablet Oral TID  . docusate sodium  100 mg Oral BID  . Hemocyte Plus  106 mg Oral BID  . loratadine   5 mg Oral QPM  . metoCLOPramide  5 mg Oral TID AC  . pantoprazole  80 mg Oral BID AC  . potassium chloride  10 mEq Intravenous Q1 Hr x 4  . sucralfate  1 g Oral BID AC  . Vitamin D (Ergocalciferol)  50,000 Units Oral Q7 days  . DISCONTD: lansoprazole  60 mg Oral BID  . DISCONTD: levocetirizine  5 mg Oral QPM  . DISCONTD: ondansetron  4 mg Oral TID AC  . DISCONTD: potassium chloride  40 mEq Oral Once   Continuous:   . sodium chloride 75 mL/hr at 11/17/11 1830   XBJ:[id number], acetaminophen, albuterol, bisacodyl, ondansetron, promethazine, zolpidem, DISCONTD: promethazine, DISCONTD: promethazine, DISCONTD: promethazine  Results for orders placed during the hospital encounter of 11/17/11 (from the past 48 hour(s))  COMPREHENSIVE METABOLIC PANEL  Status: Abnormal   Collection Time   11/17/11 12:40 PM      Component Value Range Comment   Sodium 138  135 - 145 (mEq/L)    Potassium 3.6  3.5 - 5.1 (mEq/L)    Chloride 99  96 - 112 (mEq/L)    CO2 30  19 - 32 (mEq/L)    Glucose, Bld 105 (*) 70 - 99 (mg/dL)    BUN 10  6 - 23 (mg/dL)    Creatinine, Ser 1.61  0.50 - 1.10 (mg/dL)    Calcium 9.4  8.4 - 10.5 (mg/dL)    Total Protein 6.2  6.0 - 8.3 (g/dL)    Albumin 3.5  3.5 - 5.2 (g/dL)    AST 16  0 - 37 (U/L) SLIGHT HEMOLYSIS   ALT 7  0 - 35 (U/L)    Alkaline Phosphatase 58  39 - 117 (U/L)    Total Bilirubin 0.5  0.3 - 1.2 (mg/dL)    GFR calc non Af Amer 78 (*) >90 (mL/min)    GFR calc Af Amer >90  >90 (mL/min)   CBC     Status: Normal   Collection Time   11/17/11 12:40 PM      Component Value Range Comment   WBC 5.8  4.0 - 10.5 (K/uL)    RBC 4.88  3.87 - 5.11 (MIL/uL)    Hemoglobin 13.8  12.0 - 15.0 (g/dL)    HCT 09.6  04.5 - 40.9 (%)    MCV 86.9  78.0 - 100.0 (fL)    MCH 28.3  26.0 - 34.0 (pg)    MCHC 32.5  30.0 - 36.0 (g/dL)    RDW 81.1  91.4 - 78.2 (%)    Platelets 262  150 - 400 (K/uL)   DIFFERENTIAL     Status: Abnormal   Collection Time   11/17/11 12:40 PM       Component Value Range Comment   Neutrophils Relative 50  43 - 77 (%)    Neutro Abs 2.9  1.7 - 7.7 (K/uL)    Lymphocytes Relative 36  12 - 46 (%)    Lymphs Abs 2.1  0.7 - 4.0 (K/uL)    Monocytes Relative 13 (*) 3 - 12 (%)    Monocytes Absolute 0.7  0.1 - 1.0 (K/uL)    Eosinophils Relative 1  0 - 5 (%)    Eosinophils Absolute 0.1  0.0 - 0.7 (K/uL)    Basophils Relative 0  0 - 1 (%)    Basophils Absolute 0.0  0.0 - 0.1 (K/uL)   LIPASE, BLOOD     Status: Normal   Collection Time   11/17/11 12:40 PM      Component Value Range Comment   Lipase 29  11 - 59 (U/L)   URINALYSIS, ROUTINE W REFLEX MICROSCOPIC     Status: Normal   Collection Time   11/17/11  1:00 PM      Component Value Range Comment   Color, Urine YELLOW  YELLOW     APPearance CLEAR  CLEAR     Specific Gravity, Urine 1.014  1.005 - 1.030     pH 7.5  5.0 - 8.0     Glucose, UA NEGATIVE  NEGATIVE (mg/dL)    Hgb urine dipstick NEGATIVE  NEGATIVE     Bilirubin Urine NEGATIVE  NEGATIVE     Ketones, ur NEGATIVE  NEGATIVE (mg/dL)    Protein, ur NEGATIVE  NEGATIVE (mg/dL)    Urobilinogen, UA 0.2  0.0 - 1.0 (mg/dL)    Nitrite NEGATIVE  NEGATIVE     Leukocytes, UA NEGATIVE  NEGATIVE  MICROSCOPIC NOT DONE ON URINES WITH NEGATIVE PROTEIN, BLOOD, LEUKOCYTES, NITRITE, OR GLUCOSE <1000 mg/dL.  MAGNESIUM     Status: Normal   Collection Time   11/17/11  7:08 PM      Component Value Range Comment   Magnesium 2.0  1.5 - 2.5 (mg/dL)   PHOSPHORUS     Status: Normal   Collection Time   11/17/11  7:08 PM      Component Value Range Comment   Phosphorus 3.8  2.3 - 4.6 (mg/dL)   APTT     Status: Normal   Collection Time   11/17/11  7:08 PM      Component Value Range Comment   aPTT 27  24 - 37 (seconds)   PROTIME-INR     Status: Normal   Collection Time   11/17/11  7:08 PM      Component Value Range Comment   Prothrombin Time 14.4  11.6 - 15.2 (seconds)    INR 1.10  0.00 - 1.49    TSH     Status: Normal   Collection Time   11/17/11  7:08 PM        Component Value Range Comment   TSH 0.754  0.350 - 4.500 (uIU/mL)   COMPREHENSIVE METABOLIC PANEL     Status: Abnormal   Collection Time   11/18/11  3:40 AM      Component Value Range Comment   Sodium 140  135 - 145 (mEq/L)    Potassium 3.0 (*) 3.5 - 5.1 (mEq/L)    Chloride 103  96 - 112 (mEq/L)    CO2 31  19 - 32 (mEq/L)    Glucose, Bld 85  70 - 99 (mg/dL)    BUN 9  6 - 23 (mg/dL)    Creatinine, Ser 0.27  0.50 - 1.10 (mg/dL)    Calcium 8.6  8.4 - 10.5 (mg/dL)    Total Protein 5.0 (*) 6.0 - 8.3 (g/dL)    Albumin 2.9 (*) 3.5 - 5.2 (g/dL)    AST 10  0 - 37 (U/L)    ALT 6  0 - 35 (U/L)    Alkaline Phosphatase 49  39 - 117 (U/L)    Total Bilirubin 0.5  0.3 - 1.2 (mg/dL)    GFR calc non Af Amer 64 (*) >90 (mL/min)    GFR calc Af Amer 74 (*) >90 (mL/min)   CBC     Status: Abnormal   Collection Time   11/18/11  3:40 AM      Component Value Range Comment   WBC 4.5  4.0 - 10.5 (K/uL)    RBC 4.23  3.87 - 5.11 (MIL/uL)    Hemoglobin 11.8 (*) 12.0 - 15.0 (g/dL)    HCT 25.3  66.4 - 40.3 (%)    MCV 88.9  78.0 - 100.0 (fL)    MCH 27.9  26.0 - 34.0 (pg)    MCHC 31.4  30.0 - 36.0 (g/dL)    RDW 47.4  25.9 - 56.3 (%)    Platelets 214  150 - 400 (K/uL)   GLUCOSE, CAPILLARY     Status: Normal   Collection Time   11/18/11  7:39 AM      Component Value Range Comment   Glucose-Capillary 88  70 - 99 (mg/dL)     Dg Abd Acute W/chest  11/17/2011  *RADIOLOGY REPORT*  Clinical  Data: Abdominal pain with vomiting and nausea.  Left chest pain and shortness of breath.  ACUTE ABDOMEN SERIES (ABDOMEN 2 VIEW & CHEST 1 VIEW)  Comparison: 11/05/2011.  Findings: Trachea is midline.  Heart size stable.  Surgical clips are seen in the expected location of the thyroid.  Hiatal hernia. Scarring at the lung bases.  Lungs are otherwise clear.  Two views of the abdomen show stool in the majority of the colon. No small bowel dilatation.  IMPRESSION: Constipation.  Original Report Authenticated By: Reyes Ivan,  M.D.   Dg Abd Portable 1v  11/18/2011  *RADIOLOGY REPORT*  Clinical Data: Nausea and abdominal cramping.  PORTABLE ABDOMEN - 1 VIEW  Comparison: Acute abdominal series 11/17/2011.  Findings: Moderate stool is present in the rectosigmoid colon. There is no evidence for obstruction.  Left basilar airspace disease is again noted.  IMPRESSION: Moderate stool in the rectosigmoid colon.  Original Report Authenticated By: Jamesetta Orleans. MATTERN, M.D.    Review of Systems  Constitutional: Positive for weight loss (8 lbs last month). Negative for fever, chills, malaise/fatigue and diaphoresis.  HENT: Negative for hearing loss, ear pain, nosebleeds, congestion, sore throat, tinnitus and ear discharge. Neck pain: back pain with bad reflux symptoms.   Respiratory: Negative for stridor.   Cardiovascular: Positive for leg swelling (legs swell.). Negative for chest pain, palpitations, orthopnea, claudication and PND.  Gastrointestinal: Positive for heartburn (Significant GERD/since at least 2005), nausea, vomiting, abdominal pain, diarrhea and constipation (Has bouts of constipation, and followed with some diarrhea). Negative for blood in stool and melena.  Genitourinary:       She's had trouble with constipation and urinary retention, couldn't void.  Musculoskeletal: Positive for back pain. Negative for joint pain and falls.       She goes to the [facility] to walk and still drives.  Neurological: Positive for tremors (chronic Right hand tremor, made worse with reglan.) and headaches (occasional  take tylenol). Negative for dizziness, tingling, sensory change, speech change, focal weakness, seizures, loss of consciousness and weakness.  Endo/Heme/Allergies: Negative.   Psychiatric/Behavioral: Negative.    Blood pressure 119/76, pulse 68, temperature 97.7 F (36.5 C), temperature source Oral, resp. rate 16, height 5\' 3"  (1.6 m), weight 52.98 kg (116 lb 12.8 oz), SpO2 100.00%. Physical Exam  Constitutional: She is  oriented to person, place, and time. She appears well-developed and well-nourished. No distress.  HENT:  Head: Normocephalic and atraumatic.  Nose: Nose normal.  Eyes: Conjunctivae and EOM are normal. Pupils are equal, round, and reactive to light. Right eye exhibits no discharge. Left eye exhibits no discharge. No scleral icterus.  Neck: Normal range of motion. Neck supple. No JVD present. No tracheal deviation present. No thyromegaly present.  Cardiovascular: Normal rate, regular rhythm, normal heart sounds and intact distal pulses.  Exam reveals no gallop.   No murmur heard. Respiratory: Effort normal and breath sounds normal. No respiratory distress. She has no wheezes. She has no rales. She exhibits no tenderness.  GI: Soft. Bowel sounds are normal. She exhibits no distension and no mass. There is no tenderness. There is no rebound and no guarding.       Well healed midline surgical scar  Musculoskeletal: She exhibits no edema.  Lymphadenopathy:    She has no cervical adenopathy.  Neurological: She is alert and oriented to person, place, and time. She has normal reflexes.  Skin: Skin is warm and dry. No rash noted. She is not diaphoretic. No erythema.  Psychiatric: She has  a normal mood and affect. Her behavior is normal. Judgment and thought content normal.    Assessment/Plan: 1. Recurrent nausea and vomiting with some narrowing at the junction of the stomach and the small bowel. Status post esophagogastrectomy 1993 Dr. Ofilia Neas. Concern for gastric outlet obstruction. 2. Long-term history of reflux, and strictures with multiple EGDs and dilatations. Dr. [name], Dr. Leone Payor and Dr. Christella Hartigan 3. Hypertension 4. Anemia 5. Asthma and bronchitis with a history of pneumonia secondary to aspiration. 6. Osteoporosis  Plan: Dr.Gerkin will review with Dr. Christella Hartigan. We will recommend this cardiovascular consult, with further recommendations to follow after all studies have been  reviewed. [facility] physician assistant for Dr. Darnell Level.  Daaiel Starlin 11/18/2011, 2:43 PM

## 2011-11-18 NOTE — Progress Notes (Signed)
[facility] Progress Note  SUBJECTIVE: feels a little full after clear liquid breakfast. She is also have some abdominal cramping.  OBJECTIVE:  Vital signs in last 24 hours: Temp:  [97.4 F (36.3 C)-98.2 F (36.8 C)] 97.8 F (36.6 C) (04/19 0549) Pulse Rate:  [55-69] 55  (04/19 0549) Resp:  [14-18] 16  (04/19 0549) BP: (95-132)/(57-80) 103/65 mmHg (04/19 0549) SpO2:  [94 %-97 %] 96 % (04/19 0549) Weight:  [116 lb 12.8 oz (52.98 kg)-117 lb (53.071 kg)] 116 lb 12.8 oz (52.98 kg) (04/18 1800) Last BM Date: 11/17/11 General:    Pleasant white female in NAD Abdomen:  Soft, nontender and nondistended. Normal bowel sounds. Neurologic:  Alert and oriented,  grossly normal neurologically. Psych:  Cooperative. Normal mood and affect.    Lab Results:  Basename 11/18/11 0340 11/17/11 1240  WBC 4.5 5.8  HGB 11.8* 13.8  HCT 37.6 42.4  PLT 214 262   BMET  Basename 11/18/11 0340 11/17/11 1240 11/16/11 1620  NA 140 138 141  K 3.0* 3.6 4.0  CL 103 99 100  CO2 31 30 31   GLUCOSE 85 105* 100*  BUN 9 10 8   CREATININE 0.84 0.74 0.8  CALCIUM 8.6 9.4 9.9   LFT  Basename 11/18/11 0340  PROT 5.0*  ALBUMIN 2.9*  AST 10  ALT 6  ALKPHOS 49  BILITOT 0.5  BILIDIR --  IBILI --   PT/INR  Basename 11/17/11 1908  LABPROT 14.4  INR 1.10    Studies/Results: Dg Abd Acute W/chest  11/17/2011  *RADIOLOGY REPORT*  Clinical Data: Abdominal pain with vomiting and nausea.  Left chest pain and shortness of breath.  ACUTE ABDOMEN SERIES (ABDOMEN 2 VIEW & CHEST 1 VIEW)  Comparison: 11/05/2011.  Findings: Trachea is midline.  Heart size stable.  Surgical clips are seen in the expected location of the thyroid.  Hiatal hernia. Scarring at the lung bases.  Lungs are otherwise clear.  Two views of the abdomen show stool in the majority of the colon. No small bowel dilatation.  IMPRESSION: Constipation.  Original Report Authenticated By: Reyes Ivan, M.D.    ASSESSMENT / PLAN:  44.  78 year old female with remote history of esophageal cancer, s/p esophagectomy with gastric pull through. Now with progressive nausea, vomiting and post-prandial chest pain over the last year. Distal stomach tortuous but patent on recent EGDs. Patient certainly has delayed emptying of her stomach but query intermittent obstructive process as well. Awaiting surgical evaluation (is there any type of revision that can be done in this patient?).   2. History of recurrent PNA, probably aspiration related to #1.  3. History of anastomotic stricture, s/p EGD with dilation 10/19/11. Note, she has a high anastomosis. Her problems with gastric emptying are very distal to where stricture was.  4. Constipation by abdominal films, may be cause of her abdominal discomfort this am. She would like a suppository, will order.      LOS: 1 day   Willette Cluster  11/18/2011, 9:19 AM   GI ATTENDING  AS ABOVE. SURGICAL EVALUATION UNDERWAY. THORACIC SURGERY INPUT TO BE SOUGHT. OTHERWISE SUPPORTIVE CARE. GI WILL SEE Monday, IF PT STILL HERE. DR HUNG ON FOR GI THIS WEEKEND IF GI HELP NEEDED.   Wilhemina Bonito. Eda Keys., M.D. [facility] Division of Gastroenterology

## 2011-11-18 NOTE — Evaluation (Signed)
Physical Therapy Evaluation Patient Details Name: [name] MRN: [id number] DOB: 1929-02-22 Today's Date: 11/18/2011 Time: 7846-9629 PT Time Calculation (min): 24 min  PT Assessment / Plan / Recommendation Clinical Impression  Patient admitted with vomiting again with recent admission for gastric outlet obstruction and PMH for esophogeal cancer with resection.  No current skilled PT needs identified.  Will be available for reconsult if needed if patient has mobility issues should surgical intervention be necessary.  Thanks.    PT Assessment  Patent does not need any further PT services    Follow Up Recommendations  No PT follow up    Equipment Recommendations  None recommended by PT    Frequency      Precautions / Restrictions     Pertinent Vitals/Pain Denies pain      Mobility  Bed Mobility Bed Mobility: Supine to Sit;Sit to Supine Supine to Sit: 7: Independent Sitting - Scoot to Edge of Bed: 7: Independent Sit to Supine: 7: Independent Transfers Transfers: Sit to Stand;Stand to Sit Sit to Stand: 7: Independent;With upper extremity assist Stand to Sit: 7: Independent;With upper extremity assist Ambulation/Gait Ambulation/Gait Assistance: 7: Independent Ambulation Distance (Feet): 400 Feet Assistive device: None Ambulation/Gait Assistance Details: pushing IV pole Gait Pattern: Within Functional Limits Gait velocity: WFL for community mobility  Stairs: Yes Stairs Assistance: 6: Modified independent (Device/Increase time) Stair Management Technique: One rail Right Number of Stairs: 5     Exercises     PT Goals        None due to no skilled PT needs at this time.  Visit Information  Last PT Received On: 11/18/11    Subjective Data  Subjective: I can get up and walk. Just weak cause can't tolerate food. Patient Stated Goal: To return home   Prior Functioning  Home Living Lives With: Alone Type of Home: House Home Access: Stairs to enter Entrance  Stairs-Number of Steps: 3-4 Entrance Stairs-Rails: Right Home Layout: One level Bathroom Shower/Tub: Health visitor: Handicapped height Home Adaptive Equipment: Grab bars in shower;Built-in shower seat Prior Function Level of Independence: Independent Able to Take Stairs?: Yes Driving: Yes Comments: Was walking 2mi 3x/wk prior to getting sick about a month ago.    Cognition  Overall Cognitive Status: Appears within functional limits for tasks assessed/performed Arousal/Alertness: Awake/alert Orientation Level: Appears intact for tasks assessed Behavior During Session: [facility] for tasks performed    Extremity/Trunk Assessment Right Lower Extremity Assessment RLE ROM/Strength/Tone: [facility] for tasks assessed Left Lower Extremity Assessment LLE ROM/Strength/Tone: [facility] for tasks assessed Trunk Assessment Trunk Assessment: Kyphotic;Other exceptions Trunk Exceptions: cervical left lateral flexion and rotation with h/o surgical incision on left side of neck    Balance    End of Session PT - End of Session Equipment Utilized During Treatment: Gait belt Activity Tolerance: Patient tolerated treatment well Patient left: in bed;with call bell/phone within reach   [facility] 11/18/2011, 10:45 AM

## 2011-11-18 NOTE — Progress Notes (Signed)
INITIAL ADULT NUTRITION ASSESSMENT Date: 11/18/2011   Time: 4:48 PM Reason for Assessment: Nutrition risk   Food/Nutrition Related Hx: Pt with poor intake for the past 2-3 weeks r/t nausea and vomiting. Pt with progressively worsening nausea and vomiting in the past 2 months. Pt recently seen earlier in the month at [facility] by RD for gastroparesis diet education. Pt reports her food choices at home are small bland portions of food that she grazes on throughout the day - favorite foods include yogurt, oatmeal, eggs, and grits. Pt reports vomiting Resource Breeze in the ED of this admission and being unable to tolerate Ensure/Boost PTA because all of these supplements made her sick and were too rich tasting. Pt reports 9 pound unintended weight loss in the past 2 months. Pt states she used to walk 2 miles daily at the [facility] but has not done that in the past 6 weeks - however is eager to return to active lifestyle again. Pt denies any problems chewing or swallowing as long as she takes her time and chews her food thoroughly into small pieces. Pt currently denies any nausea and states she did well with her lunch of cole slaw and fish. Pt concerned that today she thought she was constipated but then had a BM today. Pt on Reglan and Carafate which seem to have helped control her nausea. Noted pt with low potassium, getting 4 runs of IV KCl.   ASSESSMENT: Female 78 y.o.  Dx: Vomiting  Hx:  Past Medical History  Diagnosis Date  . GERD (gastroesophageal reflux disease)   . Osteoporosis   . Postmenopausal HRT (hormone replacement therapy)   . Esophageal cancer   . Anemia   . Hypertension   . Allergy   . Asthma   . Shortness of breath   . Pneumonia     hx of pneumonia  . PONV (postoperative nausea and vomiting)    Related Meds:  Scheduled Meds:   . sodium chloride   Intravenous Once  . antiseptic oral rinse  15 mL Mouth Rinse BID  . bisacodyl  10 mg Rectal Once  .  bisoprolol-hydrochlorothiazide  1 tablet Oral Daily  . Calcium-Vitamin D-Vitamin K  1 tablet Oral TID  . docusate sodium  100 mg Oral BID  . Hemocyte Plus  106 mg Oral BID  . loratadine  5 mg Oral QPM  . metoCLOPramide  5 mg Oral TID AC  . pantoprazole  80 mg Oral BID AC  . potassium chloride  10 mEq Intravenous Q1 Hr x 4  . sucralfate  1 g Oral BID AC  . Vitamin D (Ergocalciferol)  50,000 Units Oral Q7 days  . DISCONTD: lansoprazole  60 mg Oral BID  . DISCONTD: levocetirizine  5 mg Oral QPM  . DISCONTD: potassium chloride  40 mEq Oral Once   Continuous Infusions:   . sodium chloride 75 mL/hr at 11/17/11 1830   PRN Meds:.acetaminophen, acetaminophen, albuterol, bisacodyl, ondansetron, promethazine, zolpidem  Ht: 5\' 3"  (160 cm)  Wt: 116 lb 12.8 oz (52.98 kg)  Ideal Wt: 115 lb % Ideal Wt: 100  Usual Wt: 125 lb % Usual Wt: 93  Body mass index is 20.69 kg/(m^2).  Labs:  CMP     Component Value Date/Time   NA 140 11/18/2011 0340   K 3.0* 11/18/2011 0340   CL 103 11/18/2011 0340   CO2 31 11/18/2011 0340   GLUCOSE 85 11/18/2011 0340   BUN 9 11/18/2011 0340   CREATININE 0.84 11/18/2011  0340   CALCIUM 8.6 11/18/2011 0340   PROT 5.0* 11/18/2011 0340   ALBUMIN 2.9* 11/18/2011 0340   AST 10 11/18/2011 0340   ALT 6 11/18/2011 0340   ALKPHOS 49 11/18/2011 0340   BILITOT 0.5 11/18/2011 0340   GFRNONAA 64* 11/18/2011 0340   GFRAA 74* 11/18/2011 0340   No intake or output data in the 24 hours ending 11/18/11 1724  Last BM - 11/17/11  Diet Order: General   IVF:    sodium chloride Last Rate: 75 mL/hr at 11/17/11 1830    Estimated Nutritional Needs:   Kcal: 1600-1850 Protein: 65-80g Fluid: 1.5-1.8L  NUTRITION DIAGNOSIS: -Inadequate oral intake (NI-2.1).  Status: Ongoing -Pt meets criteria for severe PCM of chronic illness AEB 6.8% weight loss in the past 2 months with <75% energy intake in the past month per pt report    RELATED TO: nausea/vomiting  AS EVIDENCE BY: pt  statement, weight loss PTA  MONITORING/EVALUATION(Goals): 1. Pt to have no further nausea/vomiting.  2. Pt to consume >75% of meals.   EDUCATION NEEDS: -No education needs identified at this time  INTERVENTION: Encouraged gradual increased intake as nausea subsides. Will monitor.   Dietitian #: [id number]  DOCUMENTATION CODES Per approved criteria  -Severe malnutrition in the context of chronic illness    Marshall Cork 11/18/2011, 4:48 PM

## 2011-11-18 NOTE — Progress Notes (Addendum)
Patient ID: [name], female   DOB: 09/06/1928, 78 y.o.   MRN: [id number]  Assessment/Plan   Principal Problem:   *Vomiting  - likely secondary to gastric outlet obstruction  - patient has no complains of vomiting but she does feel little nauseous - continue Zofran for nausea and phenergan for vomiting  - patient is already on Reglan in addition to those medications so while this may a therapeutic duplication this is actually patient home medication regimen  - diet was advanced to regualr per patient wishes; she tolerated clear liquids  Active Problems:   Constipation - continue colace - given 1 dose of bisacodyl  Hypokalemia - likely secondary to GI losses - repleted with 40 meq PO potassium chloride - follow up BMP in am  Gastric outlet obstruction  - secondary to resection due to esophageal cancer  - we will continue home medication regimen   ANEMIA-IRON DEFICIENCY  - hemoglobin slightly down from yesterday - follow up CBC in am - no transfusion necessary at this time  HYPERTENSION  - continue Bisoprolol/Hctz  - BP at goal 103/65  GERD  - continue prevacid   HX OF ESOPHAGEAL CANCER  - status post resection   Asthma with bronchitis  - stable on admission  - no need for nebulizer treatment at this time  - continue albuterol HFA   Esophageal stricture  - continue prevacid   DVT Prophylaxis - SCD bilaterally   Code Status - full code   Education  - test results and diagnostic studies were discussed with patient  - patient has verbalized the understanding  - questions were answered at the bedside and contact information was provided for additional questions or concerns   Disposition - plan to D/C in next 48 hours  Consults: 1. Gastroenterology - [facility] 2. Physical therapy - recommended no PT follow up as there are no skilled needs at this time  Subjective: No events overnight.   Objective:  Vital signs in last 24 hours:  Filed Vitals:     11/17/11 1646 11/17/11 1800 11/17/11 2212 11/18/11 0549  BP: 95/57 101/66 96/58 103/65  Pulse: 61 64 62 55  Temp: 98.2 F (36.8 C) 98 F (36.7 C) 97.7 F (36.5 C) 97.8 F (36.6 C)  TempSrc: Oral Oral Oral Oral  Resp: 18 14 16 16   Height:  5\' 3"  (1.6 m)    Weight:  52.98 kg (116 lb 12.8 oz)    SpO2: 95% 94% 96% 96%    Intake/Output from previous day:  No intake or output data in the 24 hours ending 11/18/11 1250  Physical Exam: General: Alert, awake, oriented x3, in no acute distress. HEENT: No bruits, no goiter. Moist mucous membranes, no scleral icterus, no conjunctival pallor. Heart: Regular rate and rhythm, S1/S2 +, no murmurs, rubs, gallops. Lungs: Clear to auscultation bilaterally. No wheezing, no rhonchi, no rales.  Abdomen: non tender but patient has cramps; no rebound tenderness or guarding; slightly hard to palpation. Extremities: No clubbing or cyanosis, no pitting edema,  positive pedal pulses. Neuro: Grossly nonfocal.  Lab Results:  Lab 11/18/11 0340 11/17/11 1240  WBC 4.5 5.8  HGB 11.8* 13.8  HCT 37.6 42.4  PLT 214 262  MCV 88.9 86.9    Lab 11/18/11 0340 11/17/11 1240 11/16/11 1620  NA 140 138 141  K 3.0* 3.6 4.0  CL 103 99 100  CO2 31 30 31   GLUCOSE 85 105* 100*  BUN 9 10 8   CREATININE 0.84 0.74 0.8  CALCIUM 8.6 9.4 9.9   Lab 11/17/11 1908  INR 1.10    Studies/Results: Dg Abd Acute W/chest 11/17/2011   IMPRESSION: Constipation.    Dg Abd Portable 1v 11/18/2011    IMPRESSION: Moderate stool in the rectosigmoid colon.     Medications: Scheduled Meds:   . sodium chloride   Intravenous Once  . antiseptic oral rinse  15 mL Mouth Rinse BID  . bisacodyl  10 mg Rectal Once  . bisoprolol-hctz  1 tablet Oral Daily  . Calcium- D-Vitamin K  1 tablet Oral TID  . docusate sodium  100 mg Oral BID  . Hemocyte Plus  106 mg Oral BID  . lidocaine  20 mL Mouth/Throat Once  . loratadine  5 mg Oral QPM  . metoCLOPramide  5 mg Oral TID AC  .  ondansetron  4 mg Intravenous Once  . pantoprazole  80 mg Oral BID AC  . sucralfate  1 g Oral BID AC  . Vitamin D (Ergocalciferol)  50,000 Units Oral Q7 days   Continuous Infusions:   . sodium chloride 75 mL/hr at 11/17/11 1830   PRN Meds:.sodium chloride, acetaminophen, acetaminophen, albuterol, bisacodyl, ondansetron, promethazine, zolpidem, DISCONTD: promethazine, DISCONTD: promethazine, DISCONTD: promethazine   LOS: 1 day   Kazuto Sevey 11/18/2011, 12:50 PM  [facility] Pager: [phone]

## 2011-11-18 NOTE — Consult Note (Signed)
General Surgery [facility] - Attending  I have seen patient and reviewed the UGI series and the CT chest.  Patient does not have a mechanical obstruction at the gastric outlet.  Potentially an issue with partial volvulus or motility.  Agree with consulting her surgeon, Dr. Tyrone Sage, to evaluate and give his opinion on options for management.  Will be happy to follow and assist in any way we can.  Will review with my partners as well.  Velora Heckler, MD, [facility] Office: [phone]

## 2011-11-19 DIAGNOSIS — K311 Adult hypertrophic pyloric stenosis: Secondary | ICD-10-CM

## 2011-11-19 DIAGNOSIS — K562 Volvulus: Secondary | ICD-10-CM

## 2011-11-19 LAB — BASIC METABOLIC PANEL
CO2: 27 mEq/L (ref 19–32)
Calcium: 8.7 mg/dL (ref 8.4–10.5)
Creatinine, Ser: 0.73 mg/dL (ref 0.50–1.10)
GFR calc Af Amer: >90 mL/min (ref >90)

## 2011-11-19 LAB — CBC
MCH: 27.8 pg (ref 26.0–34.0)
MCV: 88.6 fL (ref 78.0–100.0)
Platelets: 219 10*3/uL (ref 150–400)
RDW: 14.5 % (ref 11.5–15.5)

## 2011-11-19 LAB — GLUCOSE, CAPILLARY: Glucose-Capillary: 78 mg/dL (ref 70–99)

## 2011-11-19 NOTE — Progress Notes (Signed)
Patient ID: [name], female   DOB: 03-30-1929, 78 y.o.   MRN: [id number]  General Surgery - [facility] - Progress Note  Subjective: Patient up in room.  Complains of diarrhea.  Ate 75% of breakfast with no pain, no nausea, no emesis.  Objective: Vital signs in last 24 hours: Temp:  [97.6 F (36.4 C)-98.5 F (36.9 C)] 97.6 F (36.4 C) (04/20 0510) Pulse Rate:  [61-68] 61  (04/20 0510) Resp:  [16-17] 17  (04/20 0510) BP: (119-138)/(75-78) 130/75 mmHg (04/20 0510) SpO2:  [94 %-100 %] 94 % (04/20 0510) Weight:  [120 lb 3.2 oz (54.522 kg)] 120 lb 3.2 oz (54.522 kg) (04/20 0510) Last BM Date: 11/18/11  Intake/Output from previous day: 04/19 0701 - 04/20 0700 In: 350 [P.O.:350] Out: -   Exam: HEENT - clear, not icteric Neck - soft Chest - clear bilaterally Cor - RRR, no murmur Abd - soft without distension; BS present; no tenderness Ext - no significant edema Neuro - grossly intact, no focal deficits  Lab Results:   [facility] 11/19/11 0347 11/18/11 0340  WBC 3.8* 4.5  HGB 12.0 11.8*  HCT 38.2 37.6  PLT 219 214     Basename 11/19/11 0347 11/18/11 0340  NA 140 140  K 3.6 3.0*  CL 107 103  CO2 27 31  GLUCOSE 81 85  BUN 7 9  CREATININE 0.73 0.84  CALCIUM 8.7 8.6    Studies/Results: Dg Abd Acute W/chest  11/17/2011  *RADIOLOGY REPORT*  Clinical Data: Abdominal pain with vomiting and nausea.  Left chest pain and shortness of breath.  ACUTE ABDOMEN SERIES (ABDOMEN 2 VIEW & CHEST 1 VIEW)  Comparison: 11/05/2011.  Findings: Trachea is midline.  Heart size stable.  Surgical clips are seen in the expected location of the thyroid.  Hiatal hernia. Scarring at the lung bases.  Lungs are otherwise clear.  Two views of the abdomen show stool in the majority of the colon. No small bowel dilatation.  IMPRESSION: Constipation.  Original Report Authenticated By: Reyes Ivan, M.D.   Dg Abd Portable 1v  11/18/2011  *RADIOLOGY REPORT*  Clinical Data:  Nausea and abdominal cramping.  PORTABLE ABDOMEN - 1 VIEW  Comparison: Acute abdominal series 11/17/2011.  Findings: Moderate stool is present in the rectosigmoid colon. There is no evidence for obstruction.  Left basilar airspace disease is again noted.  IMPRESSION: Moderate stool in the rectosigmoid colon.  Original Report Authenticated By: Jamesetta Orleans. MATTERN, M.D.    Assessment / Plan: 1.  Status post esophagectomy with gastric pull up reconstruction 2.  Functional gastric outlet obstruction - improved  - would request thoracic surgery evaluation  - GI following  - no role for acute surgical intervention - will ask Dr. Wenda Low from [facility] practice to review studies  Velora Heckler, MD, [facility] Office: [phone]  11/19/2011

## 2011-11-19 NOTE — Progress Notes (Signed)
Patient ID: [name], female   DOB: July 23, 1929, 78 y.o.   MRN: [id number]  Assessment/Plan   Principal Problem:   *Vomiting  - likely secondary to functional gastric outlet obstruction  - CTS input appreciated - patient has no complains of vomiting - continue Zofran for nausea and phenergan for vomiting  - patient is already on Reglan in addition to those medications so while this may a therapeutic duplication this is actually patient home medication regimen  - diet was advanced to regualr per patient wishes  Active Problems:   Constipation  - continue colace  - given 1 dose of bisacodyl   Hypokalemia  - likely secondary to GI losses  - repleted with 40 meq PO potassium chloride  - follow up BMP with potassium within normal limits  Gastric outlet obstruction  - secondary to resection due to esophageal cancer  - we will continue home medication regimen   ANEMIA-IRON DEFICIENCY  - hemoglobin slightly down from yesterday  - Hgb 12 today - no transfusion necessary at this time   HYPERTENSION  - continue Bisoprolol/Hctz  - BP at goal 130/75  GERD  - continue prevacid   HX OF ESOPHAGEAL CANCER  - status post resection   Asthma with bronchitis  - stable on admission  - no need for nebulizer treatment at this time  - continue albuterol HFA   Esophageal stricture  - continue prevacid   DVT Prophylaxis - SCD bilaterally   Code Status - full code   Education  - test results and diagnostic studies were discussed with patient  - patient has verbalized the understanding  - questions were answered at the bedside and contact information was provided for additional questions or concerns   Subjective: No events overnight.   Objective:  Vital signs in last 24 hours:  Filed Vitals:   11/18/11 0549 11/18/11 1407 11/18/11 2030 11/19/11 0510  BP: 103/65 119/76 138/78 130/75  Pulse: 55 68 66 61  Temp: 97.8 F (36.6 C) 97.7 F (36.5 C) 98.5 F (36.9 C) 97.6  F (36.4 C)  TempSrc: Oral Oral Oral Oral  Resp: 16 16 17 17   Height:      Weight:    54.522 kg (120 lb 3.2 oz)  SpO2: 96% 100% 96% 94%    Intake/Output from previous day:   Intake/Output Summary (Last 24 hours) at 11/19/11 1135 Last data filed at 11/19/11 0759  Gross per 24 hour  Intake    470 ml  Output      0 ml  Net    470 ml    Physical Exam: General: Alert, awake, oriented x3, in no acute distress. HEENT: No bruits, no goiter. Moist mucous membranes, no scleral icterus, no conjunctival pallor. Heart: Regular rate and rhythm, S1/S2 +, no murmurs, rubs, gallops. Lungs: Clear to auscultation bilaterally. No wheezing, no rhonchi, no rales.  Abdomen: Soft, nontender, nondistended, positive bowel sounds. Extremities: No clubbing or cyanosis, no pitting edema,  positive pedal pulses. Neuro: Grossly nonfocal.  Lab Results:  Lab 11/19/11 0347 11/18/11 0340 11/17/11 1240  WBC 3.8* 4.5 5.8  HGB 12.0 11.8* 13.8  HCT 38.2 37.6 42.4  PLT 219 214 262  MCV 88.6 88.9 86.9    Lab 11/19/11 0347 11/18/11 0340 11/17/11 1240 11/16/11 1620  NA 140 140 138 141  K 3.6 3.0* 3.6 4.0  CL 107 103 99 100  CO2 27 31 30 31   GLUCOSE 81 85 105* 100*  BUN 7 9 10  8  CREATININE 0.73 0.84 0.74 0.8  CALCIUM 8.7 8.6 9.4 9.9    Lab 11/17/11 1908  INR 1.10    Studies/Results: Dg Abd Acute W/chest 11/17/2011 IMPRESSION: Constipation.    Dg Abd Portable 1v 11/18/2011    IMPRESSION: Moderate stool in the rectosigmoid colon.     Medications: Scheduled Meds:   . antiseptic oral rinse  15 mL Mouth Rinse BID  . bisacodyl  10 mg Rectal Once  . bisoprolol-hydrochlorothiazide  1 tablet Oral Daily  . Calcium-Vitamin D-Vitamin K  1 tablet Oral TID  . docusate sodium  100 mg Oral BID  . Hemocyte Plus  106 mg Oral BID  . loratadine  5 mg Oral QPM  . metoCLOPramide  5 mg Oral TID AC  . pantoprazole  80 mg Oral BID AC  . potassium chloride  10 mEq Intravenous Q1 Hr x 4  . sucralfate  1 g Oral  BID AC  . Vitamin D (Ergocalciferol)  50,000 Units Oral Q7 days   Continuous Infusions:   . sodium chloride 75 mL/hr at 11/18/11 1807   PRN Meds:.acetaminophen, acetaminophen, albuterol, bisacodyl, ondansetron, promethazine, zolpidem   LOS: 2 days   Kavitha Lansdale 11/19/2011, 11:35 AM  [facility] Pager: [phone]

## 2011-11-20 MED ORDER — MORPHINE SULFATE 2 MG/ML IJ SOLN
1.0000 mg | Freq: Four times a day (QID) | INTRAMUSCULAR | Status: DC | PRN
  Administered 2011-11-20 – 2011-11-21 (×5): 1 mg via INTRAVENOUS
  Filled 2011-11-20 (×6): qty 1

## 2011-11-20 NOTE — Progress Notes (Signed)
Pt. Continues with Abdominal and Left rib pain. States pain is sharp non-radiating and was not relieved by Tylenol supp. Nausea and vomiting decreased. Had 150 cc emesis. Dr. Elisabeth Pigeon notified, new orders received. Pt. Is concerned she may need another tube down her nose to drain stomach. Will continue to assess and monitor.

## 2011-11-20 NOTE — Progress Notes (Signed)
Patient ID: [name], female   DOB: [birth year], 78 y.o.   MRN: [id number]  Assessment/Plan   Principal Problem:   *Vomiting  - likely secondary to functional gastric outlet obstruction  - CTS input appreciated  - patient has no complains of vomiting  - continue Zofran for nausea and phenergan for vomiting  - patient is already on Reglan in addition to those medications [name] while this may a therapeutic duplication this is actually patient home medication regimen  - diet was advanced to regualr per patient wishes   Active Problems:   Constipation  - continue colace  - given 1 dose of bisacodyl   Hypokalemia  - likely secondary to GI losses  - repleted with 40 meq PO potassium chloride  - follow up BMP with potassium within normal limits   Gastric outlet obstruction  - secondary to resection due to esophageal cancer  - we will continue home medication regimen   ANEMIA-IRON DEFICIENCY  - hemoglobin stable  HYPERTENSION  - continue Bisoprolol/Hctz  - BP at goal 121/71  GERD  - continue prevacid   HX OF ESOPHAGEAL CANCER  - status post resection   Asthma with bronchitis  - stable on admission  - no need for nebulizer treatment at this time  - continue albuterol HFA   Esophageal stricture  - continue prevacid   DVT Prophylaxis - SCD bilaterally   Code Status - full code   Education  - test results and diagnostic studies were discussed with patient and patient's sister [name] in length about [name]'s condition. - patient and patient's sister has verbalized the understanding  - questions were answered at the bedside and contact information was provided for additional questions or concerns   Subjective: No events overnight. Patient denies diarrhea or vomiting.  Objective:  Vital signs in last 24 hours:  Filed Vitals:   11/19/11 1402 11/19/11 2115 11/20/11 0532 11/20/11 0947  BP: 120/59 124/74 108/67 121/71  Pulse: 67 60 62 73  Temp: 98.8 F  (37.1 C) 98.5 F (36.9 C) 98 F (36.7 C)   TempSrc: Oral Oral Oral   Resp: 16 16 16    Height:      Weight:   54.432 kg (120 lb)   SpO2: 99% 98% 97%     Intake/Output from previous day:  No intake or output data in the 24 hours ending 11/20/11 1140  Physical Exam: General: Alert, awake, oriented x3, in no acute distress. HEENT: No bruits, no goiter. Moist mucous membranes, no scleral icterus, no conjunctival pallor. Heart: Regular rate and rhythm, S1/S2 +, no murmurs, rubs, gallops. Lungs: Clear to auscultation bilaterally. No wheezing, no rhonchi, no rales.  Abdomen: Soft, nontender, nondistended, positive bowel sounds. Extremities: No clubbing or cyanosis, no pitting edema,  positive pedal pulses. Neuro: Grossly nonfocal.  Lab Results:  Lab 11/19/11 0347 11/18/11 0340 11/17/11 1240  WBC 3.8* 4.5 5.8  HGB 12.0 11.8* 13.8  HCT 38.2 37.6 42.4  PLT 219 214 262  MCV 88.6 88.9 86.9    Lab 11/19/11 0347 11/18/11 0340 11/17/11 1240 11/16/11 1620  NA 140 140 138 141  K 3.6 3.0* 3.6 4.0  CL 107 103 99 100  CO2 27 31 30 31   GLUCOSE 81 85 105* 100*  BUN 7 9 10 8   CREATININE 0.73 0.84 0.74 0.8  CALCIUM 8.7 8.6 9.4 9.9    Lab 11/17/11 1908  INR 1.10  PROTIME --    Medications: Scheduled Meds:   . antiseptic  oral rinse  15 mL Mouth Rinse BID  . bisacodyl  10 mg Rectal Once  . bisoprolol-hydrochlorothiazide  1 tablet Oral Daily  . Calcium-Vitamin D-Vitamin K  1 tablet Oral TID  . docusate sodium  100 mg Oral BID  . Hemocyte Plus  106 mg Oral BID  . loratadine  5 mg Oral QPM  . metoCLOPramide  5 mg Oral TID AC  . pantoprazole  80 mg Oral BID AC  . sucralfate  1 g Oral BID AC  . Vitamin D (Ergocalciferol)  50,000 Units Oral Q7 days   Continuous Infusions:   . sodium chloride 75 mL/hr at 11/20/11 0300   PRN Meds:.acetaminophen, acetaminophen, albuterol, bisacodyl, ondansetron, promethazine, zolpidem   LOS: 3 days   Omer Monter 11/20/2011, 11:40 AM  [facility]  HOSPITALIST Pager: [phone]

## 2011-11-21 ENCOUNTER — Telehealth: Payer: Self-pay | Admitting: Internal Medicine

## 2011-11-21 DIAGNOSIS — K311 Adult hypertrophic pyloric stenosis: Secondary | ICD-10-CM

## 2011-11-21 LAB — CBC
Hemoglobin: 14.1 g/dL (ref 12.0–15.0)
MCH: 27.9 pg (ref 26.0–34.0)
MCHC: 32 g/dL (ref 30.0–36.0)

## 2011-11-21 LAB — BASIC METABOLIC PANEL
Calcium: 9.1 mg/dL (ref 8.4–10.5)
Creatinine, Ser: 0.73 mg/dL (ref 0.50–1.10)
GFR calc non Af Amer: 79 mL/min — ABNORMAL LOW (ref >90)
Glucose, Bld: 136 mg/dL — ABNORMAL HIGH (ref 70–99)
Sodium: 142 mEq/L (ref 135–145)

## 2011-11-21 LAB — GLUCOSE, CAPILLARY

## 2011-11-21 MED ORDER — MORPHINE SULFATE 2 MG/ML IJ SOLN
INTRAMUSCULAR | Status: AC
  Administered 2011-11-21: 1 mg via INTRAVENOUS
  Filled 2011-11-21: qty 1

## 2011-11-21 MED ORDER — MAGIC MOUTHWASH W/LIDOCAINE
5.0000 mL | Freq: Three times a day (TID) | ORAL | Status: DC
  Administered 2011-11-21 – 2011-11-30 (×26): 5 mL via ORAL
  Filled 2011-11-21 (×30): qty 5

## 2011-11-21 MED ORDER — CALCIUM CARBONATE-VITAMIN D 500-200 MG-UNIT PO TABS
1.0000 | ORAL_TABLET | Freq: Three times a day (TID) | ORAL | Status: DC
  Administered 2011-11-23 – 2011-11-30 (×13): 1 via ORAL
  Filled 2011-11-21 (×30): qty 1

## 2011-11-21 MED ORDER — POTASSIUM CHLORIDE 10 MEQ/100ML IV SOLN
10.0000 meq | INTRAVENOUS | Status: AC
  Administered 2011-11-21 – 2011-11-22 (×6): 10 meq via INTRAVENOUS
  Filled 2011-11-21 (×6): qty 100

## 2011-11-21 NOTE — Progress Notes (Addendum)
Patient ID: [name], female   DOB: [birth year], 78 y.o.   MRN: [id number]  Interim summary: 78 year old female with multiple medical comorbidities including but not limited to esophageal cancer status post resection in the past presented to [facility] ED 4 days ago for intractable nausea and vomiting. This has been a chronic issue for the patient and she has had endoscopy done 11/06/2028 and it showed anastomosis in the proximal esophagus but no obvious gastric outlet obstruction.  Consults to date: 1. Gen. Surgery (Dr. Gerrit Friends) 2. cardiothoracic surgery (Dr. Tyrone Sage) 3. GI (Dr. Marina Goodell) 4. Physical therapy - no PT follow up recomended  Assessment/Plan   Principal Problem:   *Nausea and Vomiting  - likely secondary to functional gastric outlet obstruction  - CTS input appreciated - spoke with the nurse of Dr. Tyrone Sage and recommendation was to do perhaps gastric emptying studies or EGD if one has not been done recently); they informed me they will see the patient probably tomorrow - Followup gastric emptying study - GI is following - continue Zofran for nausea and phenergan for vomiting  - patient is already on Reglan in addition to those medications [name] while this may a therapeutic duplication this is actually patient home medication regimen  - Advance diet as tolerated  Active Problems:   Constipation  - continue colace  - Patient does have a bowel movement  every other day  Hypokalemia  - likely secondary to GI losses  - repleted with 40 meq IV potassium chloride  - follow up BMP in a.m.  Gastric outlet obstruction, functional - secondary to resection due to esophageal cancer  - GI and surgery following  ANEMIA-IRON DEFICIENCY  - hemoglobin stable at 14 point  HYPERTENSION  - continue Bisoprolol/Hctz  - BP at goal 120/89  GERD  - continue Protonix 80 mg twice a day  HX OF ESOPHAGEAL CANCER  - status post resection   Asthma with bronchitis  - stable on  admission  - no need for nebulizer treatment at this time  - continue albuterol HFA   Esophageal stricture  - continue protonix 80 mg BID  DVT Prophylaxis - SCD bilaterally   Code Status - full code   Education  - test results and diagnostic studies were discussed with patient and patient's sister [name] in length about [name]'s condition. The patient as well as patient's sister insisted on seeing Dr. Tyrone Sage and tha something needs to be done. I have reassured her that I have spoken with Dr. Dennie Maizes assistant and that we will proceed with the recommendations they have suggested to start off with gastric emptying studies as EGD has already been done recently. Both, [name] and her sister have verbalized the understanding and have agreed to this plan. - questions were answered at the bedside and contact information was provided for additional questions or concerns   Disposition - follow up PT evaluation  Subjective: No events overnight. Patient continues to complain of nausea and vomiting.  Objective:  Vital signs in last 24 hours:  Filed Vitals:   11/20/11 1359 11/20/11 2154 11/21/11 0614 11/21/11 1410  BP: 128/85 142/89 146/84 120/89  Pulse: 62 89 75 72  Temp: 98 F (36.7 C)  98.3 F (36.8 C) 97.8 F (36.6 C)  TempSrc: Axillary  Oral Oral  Resp: 18 20 20 18   Height:      Weight:      SpO2: 99% 97% 97% 95%    Intake/Output from previous day:  Intake/Output Summary (Last 24 hours) at 11/21/11 1557 Last data filed at 11/21/11 1500  Gross per 24 hour  Intake 2123.75 ml  Output   1000 ml  Net 1123.75 ml    Physical Exam: General:  no acute distress. HEENT: No bruits, no goiter. Moist mucous membranes, no scleral icterus, no conjunctival pallor. Heart: Regular rate and rhythm, S1/S2 +, no murmurs, rubs, gallops. Lungs: Clear to auscultation bilaterally. No wheezing, no rhonchi, no rales.  Abdomen: Soft, nontender, nondistended, positive bowel  sounds. Extremities: No clubbing or cyanosis, LE (+1) pitting edema,  positive pedal pulses. Neuro: Grossly nonfocal.  Lab Results:  Lab 11/21/11 0406 11/19/11 0347 11/18/11 0340 11/17/11 1240  WBC 7.1 3.8* 4.5 5.8  HGB 14.1 12.0 11.8* 13.8  HCT 44.0 38.2 37.6 42.4  PLT 238 219 214 262  MCV 87.1 88.6 88.9 86.9    Lab 11/21/11 0406 11/19/11 0347 11/18/11 0340 11/17/11 1240 11/16/11 1620  NA 142 140 140 138 141  K 2.8* 3.6 3.0* 3.6 4.0  CL 101 107 103 99 100  CO2 30 27 31 30 31   GLUCOSE 136* 81 85 105* 100*  BUN 6 7 9 10 8   CREATININE 0.73 0.73 0.84 0.74 0.8  CALCIUM 9.1 8.7 8.6 9.4 9.9    Lab 11/17/11 1908  INR 1.10  PROTIME --    Medications: Scheduled Meds:   . antiseptic oral rinse  15 mL Mouth Rinse BID  . bisacodyl  10 mg Rectal Once  . bisoprolol-hydrochlorothiazide  1 tablet Oral Daily  . calcium-vitamin D  1 tablet Oral TID WC  . docusate sodium  100 mg Oral BID  . Hemocyte Plus  106 mg Oral BID  . loratadine  5 mg Oral QPM  . magic mouthwash w/lidocaine  5 mL Oral TID  . metoCLOPramide  5 mg Oral TID AC  . pantoprazole  80 mg Oral BID AC  . potassium chloride  10 mEq Intravenous Q1 Hr x 6  . sucralfate  1 g Oral BID AC  . Vitamin D (Ergocalciferol)  50,000 Units Oral Q7 days   Continuous Infusions:   . sodium chloride 75 mL/hr at 11/21/11 1438   PRN Meds:.acetaminophen, acetaminophen, albuterol, bisacodyl, morphine injection, ondansetron, promethazine, zolpidem   LOS: 4 days   [name] 11/21/2011, 3:57 PM  [facility] Pager: [phone]

## 2011-11-21 NOTE — Consult Note (Signed)
[address]            [place] [address]          [phone]       CHRISTY EHRSAM [facility] Medical Record #[id number] Date of Birth: [birth year]  Referring: No ref. provider found Primary Care: Carrie Mew, MD, MD  Chief Complaint:    Chief Complaint  Patient presents with  . Nausea  . Emesis  . Abdominal Pain    History of Present Illness:     Patient now is an 78 year old female who's had several months of increasing difficulty in gastric emptying.  A. esophageal cervical stricture was dilated one month ago. Upper GI and endoscopy showed poor emptying of the intrathoracic stomach but without obstruction, this is presumed a functional obstruction not mechanical.. I first saw the patient 20 years ago for esophageal cancer at that time she underwent transhiatal total esophagectomy with cervical esophagogastrostomy. Subsequently she had a cholecystectomy. She's been a long-term Reglan to the point where it's probably caused a tremor.      Past Medical History  Diagnosis Date  . GERD (gastroesophageal reflux disease)   . Osteoporosis   . Postmenopausal HRT (hormone replacement therapy)   . Esophageal cancer   . Anemia   . Hypertension   . Allergy   . Asthma   . Shortness of breath   . Pneumonia     hx of pneumonia  . PONV (postoperative nausea and vomiting)     Past Surgical History  Procedure Date  . Left oophorectomy   . Esophageal removal of cancer,gastric ppull-thru 1993   . Cholecystectomy   . Rt arm fx   . Oophorectomy   . Esophagogastroduodenoscopy 10/19/2011    Procedure: ESOPHAGOGASTRODUODENOSCOPY (EGD);  Surgeon: Rachael Fee, MD;  Location: [facility];  Service: Endoscopy;  Laterality: N/A;  . Balloon dilation 10/19/2011    Procedure: BALLOON DILATION;  Surgeon: Rachael Fee, MD;  Location: [facility] ENDOSCOPY;  Service: Endoscopy;  Laterality: N/A;  . Esophagogastroduodenoscopy 11/07/2011   Procedure: ESOPHAGOGASTRODUODENOSCOPY (EGD);  Surgeon: Meryl Dare, MD,FACG;  Location: [facility] ENDOSCOPY;  Service: Endoscopy;  Laterality: N/A;    History  Smoking status  . Never Smoker   Smokeless tobacco  . Never Used    History  Alcohol Use No    History   Social History  . Marital Status: Single    Spouse Name: N/A    Number of Children: 1  . Years of Education: N/A   Occupational History  . Retired    Social History Main Topics  . Smoking status: Never Smoker   . Smokeless tobacco: Never Used  . Alcohol Use: No  . Drug Use: No  . Sexually Active: Yes   Other Topics Concern  . Not on file   Social History Narrative   DAILY CAFFEINE USE    Allergies  Allergen Reactions  . Erythromycin Ethylsuccinate Other (See Comments)    REACTION: unspecified  . Prednisone Other (See Comments)    REACTION: unspecified    Current Facility-Administered Medications  Medication Dose Route Frequency Provider Last Rate Last Dose  . 0.9 %  sodium chloride infusion   Intravenous Continuous Alison Murray, MD 75 mL/hr at 11/21/11 1438    . acetaminophen (TYLENOL) tablet 650 mg  650 mg Oral Q6H PRN Alison Murray, MD  Or  . acetaminophen (TYLENOL) suppository 650 mg  650 mg Rectal Q6H PRN Alison Murray, MD   650 mg at 11/20/11 1705  . albuterol (PROVENTIL HFA;VENTOLIN HFA) 108 (90 BASE) MCG/ACT inhaler 2 puff  2 puff Inhalation Q6H PRN Alison Murray, MD      . antiseptic oral rinse (BIOTENE) solution 15 mL  15 mL Mouth Rinse BID Alison Murray, MD   15 mL at 11/21/11 0830  . bisacodyl (DULCOLAX) suppository 10 mg  10 mg Rectal Daily PRN Alison Murray, MD   10 mg at 11/20/11 1319  . bisacodyl (DULCOLAX) suppository 10 mg  10 mg Rectal Once Meredith Pel, NP      . bisoprolol-hydrochlorothiazide [facility]) 2.5-6.25 MG per tablet 1 tablet  1 tablet Oral Daily Alison Murray, MD   1 tablet at 11/21/11 [phone]  . calcium-vitamin D (OSCAL WITH D) 500-200 MG-UNIT per tablet 1 tablet  1  tablet Oral TID WC Alison Murray, MD      . docusate sodium (COLACE) capsule 100 mg  100 mg Oral BID Alison Murray, MD   100 mg at 11/21/11 0926  . Hemocyte Plus 106-1 MG CAPS 106 mg  106 mg Oral BID Alison Murray, MD      . loratadine (CLARITIN) tablet 5 mg  5 mg Oral QPM Alison Murray, MD   5 mg at 11/20/11 1705  . magic mouthwash w/lidocaine  5 mL Oral TID Alison Murray, MD   5 mL at 11/21/11 1617  . metoCLOPramide (REGLAN) tablet 5 mg  5 mg Oral TID [facility] Alison Murray, MD   5 mg at 11/21/11 1826  . morphine 2 MG/ML injection 1 mg  1 mg Intravenous Q6H PRN Alison Murray, MD   1 mg at 11/21/11 1729  . ondansetron (ZOFRAN) injection 4 mg  4 mg Intravenous Q6H PRN Alison Murray, MD   4 mg at 11/21/11 1317  . pantoprazole (PROTONIX) EC tablet 80 mg  80 mg Oral BID AC Alison Murray, MD   80 mg at 11/21/11 1729  . potassium chloride 10 mEq in 100 mL IVPB  10 mEq Intravenous Q1 Hr x 6 Alison Murray, MD   10 mEq at 11/21/11 1851  . promethazine (PHENERGAN) injection 12.5 mg  12.5 mg Intravenous Q4H PRN Alison Murray, MD   12.5 mg at 11/21/11 1517  . sucralfate (CARAFATE) 1 GM/10ML suspension 1 g  1 g Oral BID AC Alison Murray, MD   1 g at 11/21/11 1729  . Vitamin D (Ergocalciferol) (DRISDOL) capsule 50,000 Units  50,000 Units Oral Q7 days Alison Murray, MD      . zolpidem [facility]) tablet 5 mg  5 mg Oral QHS PRN Alison Murray, MD   5 mg at 11/20/11 2153  . DISCONTD: Calcium-Vitamin D-Vitamin K 500-100-40 MG-UNT-MCG CHEW 1 tablet  1 tablet Oral TID Alison Murray, MD   1 tablet at 11/18/11 1537    Prescriptions prior to admission  Medication Sig Dispense Refill  . albuterol (PROVENTIL HFA;VENTOLIN HFA) 108 (90 BASE) MCG/ACT inhaler Inhale 2 puffs into the lungs every 6 (six) hours as needed. For wheezing      . B Complex-C-Min-Fe-FA (HEMOCYTE PLUS) 106-1 MG CAPS Take 106 mg by mouth 2 (two) times daily.  180 each  3  . bisoprolol-hydrochlorothiazide (ZIAC) 2.5-6.25 MG per tablet Take 1 tablet by mouth  daily.  90 tablet  3  . Calcium-Vitamin D-Vitamin K (VIACTIV) 500-100-40 MG-UNT-MCG CHEW Chew by mouth 3 (three) times daily.        . feeding supplement (RESOURCE BREEZE) LIQD Take 1 Container by mouth 2 (two) times daily between meals.  240 mL  0  . lansoprazole (PREVACID SOLUTAB) 30 MG disintegrating tablet Take 2 tablets (60 mg total) by mouth 2 (two) times daily.  360 tablet  3  . levocetirizine (XYZAL) 5 MG tablet Take 5 mg by mouth every evening. Patient takes only as needed      . metoCLOPramide (REGLAN) 5 MG tablet Take 1 tablet (5 mg total) by mouth 3 (three) times daily before meals.  180 tablet  3  . ondansetron (ZOFRAN) 4 MG tablet Take 1 tablet (4 mg total) by mouth 3 (three) times daily before meals.  42 tablet  0  . promethazine (PHENERGAN) 25 MG suppository Place 25 mg rectally every 6 (six) hours as needed. For nausea      . sucralfate (CARAFATE) 1 GM/10ML suspension Take 10 mLs (1 g total) by mouth 2 (two) times daily before a meal.  3600 mL  3  . Vitamin D, Ergocalciferol, (DRISDOL) 50000 UNITS CAPS Take 50,000 Units by mouth every 7 (seven) days. On Friday      . zolpidem (AMBIEN) 5 MG tablet Take 1 tablet (5 mg total) by mouth at bedtime as needed for sleep.  90 tablet  1    Family History  Problem Relation Age of Onset  . Cancer Mother     cervical  . Heart disease Father   . Coronary artery disease    . Hypotension Sister      Review of Systems:     Cardiac Review of Systems: Y or N  Chest Pain [  n  ]  Resting SOB [  n ] Exertional SOB  [name]  ]  Orthopnea [  ]   Pedal Edema [   ]    Palpitations [  ] Syncope  [ n ]   Presyncope [   n]  General Review of Systems: [Y] = yes [  ]=no Constitional: recent weight change [  y]; anorexia [ y ]; fatigue [  y]; nausea [name]  ]; night sweats [n  ]; fever [  n]; or chills [ n ];                                                                                                                                          Dental: poor  dentition[  ]; Last Dentist visit:   Eye : blurred vision [  ]; diplopia [   ]; vision changes [  ];  Amaurosis fugax[  ]; Resp: cough [  ];  wheezing[  ];  hemoptysis[  ]; shortness of breath[  ]; paroxysmal nocturnal dyspnea[  ]; dyspnea on exertion[  ]; or orthopnea[  ];  GI:  gallstones[ yre and her and is a a day and will is asected ], vomiting[y  ];  dysphagia[  ]; melena[  ];  hematochezia [  ]; heartburn[ y ];   Hx of  Colonoscopy[  ]; GU: kidney stones [  ]; hematuria[  ];   dysuria [  ];  nocturia[  ];  history of     obstruction [  ];             Skin: rash, swelling[  ];, hair loss[  ];  peripheral edema[  ];  or itching[  ]; Musculosketetal: myalgias[  ];  joint swelling[  ];  joint erythema[  ];  joint pain[  ];  back pain[  ];  Heme/Lymph: bruising[  ];  bleeding[  ];  anemia[  ];  Neuro: TIA[  ];  headaches[  ];  stroke[  ];  vertigo[  ];  seizures[  ];   paresthesias[  ];  difficulty walking[  ];  Psych:depression[  ]; anxiety[  ];  Endocrine: diabetes[  ];  thyroid dysfunction[  ];  Immunizations: Flu [ y ]; Pneumococcal[y  ];  Other:  Physical Exam: BP 120/89  Pulse 72  Temp(Src) 97.8 F (36.6 C) (Oral)  Resp 18  Ht 5\' 3"  (1.6 m)  Wt 120 lb (54.432 kg)  BMI 21.26 kg/m2  SpO2 95%  General appearance: alert, cooperative, appears older than stated age, cachectic, distracted, fatigued, mild distress and pale Neurologic: intact and with rt arm hand tremour Heart: regular rate and rhythm, S1, S2 normal, no murmur, click, rub or gallop and normal apical impulse Lungs: clear to auscultation bilaterally and normal percussion bilaterally Abdomen: soft, non-tender; bowel sounds normal; no masses,  no organomegaly and healed incision midline Extremities: extremities normal, atraumatic, no cyanosis or edema, Homans sign is negative, no sign of DVT and no edema, redness or tenderness in the calves or thighs   Diagnostic Studies & Laboratory data:     Recent Radiology  Findings:   No results found.  Dg Chest 1 View  11/05/2011  *RADIOLOGY REPORT*  Clinical Data: Nasogastric tube placement.  CHEST - 1 VIEW  Comparison: 11/05/2011 chest radiograph from 5:04 p.m.  Findings: The patient has a gastric pull-through.  The nasogastric tube projects over the lower thorax in the lower portion of the gastric pull-through.  The intrathoracic stomach has been decompressed, and is significantly reduced in size compared to the prior chest radiograph.  Heart size is within normal limits.  There is mild atelectasis medially in both lung bases.  Tortuous thoracic aorta noted.  IMPRESSION:  1.  Nasogastric tube is in the lower part of the intrathoracic stomach, and has successfully decompressed the gastric pull- through. 2.  Mild atelectasis in both lung bases.  Original Report Authenticated By: Dellia Cloud, M.D.   Dg Chest 2 View  11/05/2011  *RADIOLOGY REPORT*  Clinical Data: Nausea and vomiting.  Esophageal cancer with gastric pull-through 1993  CHEST - 2 VIEW  Comparison: 10/17/2011  Findings: There has been significant change from the recent chest x- ray.  There is a large soft tissue density in the right paratracheal region as well as to the right of the heart.  Given history of gastric pull-through, this is most likely a fluid-filled distended stomach which may be obstructed.  Aneurysm and neoplasm considered unlikely given the time course as well as a history of prior gastric pull-through.  Cardiac enlargement.  Negative for heart failure.  No  pleural effusion.  Probable COPD with hyperinflation of the lungs.  IMPRESSION: There has been any significant change from the  recent chest x-ray. There is a large soft tissue mass in the mediastinum projecting to the right of midline.  This is most likely a fluid filled obstructed stomach in this patient with a gastric pull-through. This could be confirmed with CT scanning.  Original Report Authenticated By: Camelia Phenes, M.D.   Ct  Chest Wo Contrast  11/05/2011  *RADIOLOGY REPORT*  Clinical Data: Nausea, vomiting, status post gastric pull-through, rule out mediastinal mass.  CT CHEST WITHOUT CONTRAST  Technique:  Multidetector CT imaging of the chest was performed following the standard protocol without IV contrast.  Comparison: CT chest 11/05/2009 and chest x-ray 11/05/2011.  Findings: Again noted status post esophagectomy and gastric pull- through.  There is a marked gastric distention with fluid which is giving mass appearance on the chest x-ray.  There is mass effect on the heart mediastinal structures.  Findings are highly suspicious for gastric outlet obstruction and fluid retention in the stomach. Atherosclerotic calcifications of thoracic aorta are noted.  There is some mass effect on the trachea and main bronchi which are still patent.  There is no evidence of aspiration pneumonia.  Sagittal images of the spine shows osteopenia and mild degenerative changes thoracic spine.  Degenerative changes of the cervical spine are noted.  IMPRESSION:  1.  There is a status post esophagectomy with gastric pull through. There is marked distention of intrathoracic stomach with fluid. Findings are highly suspicious for gastric outlet obstruction. There is mass effect on the heart and mediastinal structures. 2.  No evidence of aspiration.  No pulmonary edema. 3.  Osteopenia and degenerative changes thoracic spine.  Critical findings discussed with Dr. Golda Acre from emergency room.  Original Report Authenticated By: Natasha Mead, M.D.   Dg Abd Acute W/chest  11/17/2011  *RADIOLOGY REPORT*  Clinical Data: Abdominal pain with vomiting and nausea.  Left chest pain and shortness of breath.  ACUTE ABDOMEN SERIES (ABDOMEN 2 VIEW & CHEST 1 VIEW)  Comparison: 11/05/2011.  Findings: Trachea is midline.  Heart size stable.  Surgical clips are seen in the expected location of the thyroid.  Hiatal hernia. Scarring at the lung bases.  Lungs are otherwise clear.  Two  views of the abdomen show stool in the majority of the colon. No small bowel dilatation.  IMPRESSION: Constipation.  Original Report Authenticated By: Reyes Ivan, M.D.   Dg Abd Portable 1v  11/18/2011  *RADIOLOGY REPORT*  Clinical Data: Nausea and abdominal cramping.  PORTABLE ABDOMEN - 1 VIEW  Comparison: Acute abdominal series 11/17/2011.  Findings: Moderate stool is present in the rectosigmoid colon. There is no evidence for obstruction.  Left basilar airspace disease is again noted.  IMPRESSION: Moderate stool in the rectosigmoid colon.  Original Report Authenticated By: Jamesetta Orleans. MATTERN, M.D.   Varney Biles Kayleen Memos Hans Eden  11/08/2011  *RADIOLOGY REPORT*  Clinical Data:  Gastric pull-through.  Persistent intrathoracic gastric dilatation.  UPPER GI SERIES WITH KUB  Technique: Oral thin barium was administered to the patient in the LPO position and in the standing position in order to assess the intrathoracic stomach. Fluoroscopy Time: 3.46 minutes  Comparison:  02/09/2005  Findings: Due to mild limitations with patient positioning/mobility, the pharyngeal phase of swallowing was only observed at real time, without obvious abnormality.  Gastric pull-through noted.  Emptying from the intrathoracic stomach into the small bowel at the hiatus was sluggish, although no obvious  obstruction is observed.  Luminal diameter between the stomach and the bowel is at 13 mm.  Adjacent small outpouching of contrast, for example on series 20 of 21, is thought to be incidental rugal fold and less likely represent an ulcer.  Barium did proceed sluggishly into the small bowel, and the visualized proximal small bowel appears normal.  IMPRESSION:  1.  Slow procession of barium from the intrathoracic stomach into the small bowel.  The only potential narrowing visualized was at the junction of the stomach and small bowel with the hiatus, but even that narrowing was at least 13 mm in diameter.  If clinically warranted, quantification  of the gastric emptying could be performed using a nuclear medicine gastric emptying test.  Original Report Authenticated By: Dellia Cloud, M.D.   Recent Lab Findings: Lab Results  Component Value Date   WBC 7.1 11/21/2011   HGB 14.1 11/21/2011   HCT 44.0 11/21/2011   PLT 238 11/21/2011   GLUCOSE 136* 11/21/2011   CHOL 193 09/07/2006   TRIG 90 09/07/2006   HDL 59.4 09/07/2006   LDLCALC 116* 09/07/2006   ALT 6 11/18/2011   AST 10 11/18/2011   NA 142 11/21/2011   K 2.8* 11/21/2011   CL 101 11/21/2011   CREATININE 0.73 11/21/2011   BUN 6 11/21/2011   CO2 30 11/21/2011   TSH 0.754 11/17/2011   INR 1.10 11/17/2011      Assessment / Plan:   Hypokalemia,  being replaced   There is no obvious evidence of recurrent cancer causing her current problems., Although not mechanically obstructed as a last resort to help her current situation consideration of balloon dilatation of the pyloroplasty may help. Even this may not help much but is worth a consideration since next treatment would be placement of a jejunostomy tube and enteral feedings.      Delight Ovens MD  Beeper [phone] Office [phone] 11/21/2011 9:37 PM

## 2011-11-21 NOTE — Progress Notes (Signed)
Recommend consulting Dr. Tyrone Sage from Thoracic surgery - her original surgeon.  Call general surgery as needed.  Wilmon Arms. Corliss Skains, MD, [facility] Surgery  11/21/2011 8:09 AM

## 2011-11-21 NOTE — Progress Notes (Signed)
Patient ID: [name], female   DOB: [birth year], 78 y.o.   MRN: [id number] [facility] Progress Note  Subjective: [name] is miserable- actively vomiting, and spitting up since yesterday-also has been bringing up some blood. Had tried to eat solid food then sxs got worse again, now not much of anything going down Dr. Sena [name]  Has been consulted - I spoke to his office this am Objective:  Vital signs in last 24 hours: Temp:  [98 F (36.7 C)-98.3 F (36.8 C)] 98.3 F (36.8 C) (04/22 0614) Pulse Rate:  [62-89] 75  (04/22 0614) Resp:  [18-20] 20  (04/22 0614) BP: (128-146)/(84-89) 146/84 mmHg (04/22 0614) SpO2:  [97 %-99 %] 97 % (04/22 0614) Last BM Date: 11/19/11 General:   Alert,  Well-developed,    in NAD Heart:  Regular rate and rhythm; no murmurs Pulm;kyphotic, clear Abdomen:  Soft, nontender and nondistended. Normal bowel sounds, without guarding, and without rebound.   Extremities:  Without edema. Neurologic:  Alert and  oriented x4;  grossly normal neurologically. Psych:  Alert and cooperative. Normal mood and affect.  Intake/Output from previous day: 04/21 0701 - 04/22 0700 In: 2896.8 [P.O.:480; I.V.:2416.8] Out: 1000 [Urine:250; Emesis/NG output:750] Intake/Output this shift:    Lab Results:  Basename 11/21/11 0406 11/19/11 0347  WBC 7.1 3.8*  HGB 14.1 12.0  HCT 44.0 38.2  PLT 238 219   BMET  Basename 11/21/11 0406 11/19/11 0347  NA 142 140  K 2.8* 3.6  CL 101 107  CO2 30 27  GLUCOSE 136* 81  BUN 6 7  CREATININE 0.73 0.73  CALCIUM 9.1 8.7     Assessment / Plan: #1  78 yo female S/P esophagectomy with gastric pull-up several years ago- now with persistent intermittent regurgitation/vomiting concerning for intermittent gastric outlet obstruction /volvulus.  Continue sips today as she does not feel she can eat, then try full liquids Await CVTS consult   Principal Problem:  *Vomiting Active Problems:  ANEMIA-IRON DEFICIENCY  HYPERTENSION  GERD  HX OF ESOPHAGEAL CANCER  Asthma with bronchitis  Esophageal stricture  Gastric outlet obstruction  Hypokalemia     LOS: 4 days   [name]  11/21/2011, 10:22 AM

## 2011-11-21 NOTE — Progress Notes (Signed)
Pt continues to have nausea and vomiting. I think it is worthwhile repeating her EGD in this setting to determine whether she has a functional obstruction at the level of her pylorus, or whether symptoms are due to stasis.   Plan EGD in am.

## 2011-11-21 NOTE — Progress Notes (Signed)
Called by family (Patti)through answering service tonight. Concerned that patient vomiting again and in pain. I spoke to RN and ordered NG tube to suction as that has provided relief in past with her intermittent gastric outlet problems. Patient also having some difficulty voiding so in and out catheter ordered if needed.  Iva Boop, MD, [facility] [phone] (pager) 11/21/2011 8:02 PM

## 2011-11-21 NOTE — Telephone Encounter (Signed)
Dr Lovell Sheehan talked with dr Christella Hartigan and family informed

## 2011-11-21 NOTE — Progress Notes (Signed)
Patient has been nauseous all the time and frequently spitting up brownish fluid,appears to be coffee ground at times.

## 2011-11-21 NOTE — Telephone Encounter (Signed)
Pt is in hospital at [facility] in Room # 1311. Pts family is wanting to know whats going on? Hospital keeps saying that they are going to send pts home, then pt start showing symptoms again. Pt has been vomitting, nausea and diarrhea. Pls call.

## 2011-11-22 ENCOUNTER — Encounter (HOSPITAL_COMMUNITY): Payer: Self-pay | Admitting: *Deleted

## 2011-11-22 ENCOUNTER — Encounter (HOSPITAL_COMMUNITY)
Admission: EM | Disposition: A | Discharge status: Skilled Nursing Facility | Payer: Self-pay | Source: Home / Self Care | Attending: Internal Medicine

## 2011-11-22 ENCOUNTER — Inpatient Hospital Stay (HOSPITAL_COMMUNITY): Payer: Medicare Other

## 2011-11-22 HISTORY — PX: ESOPHAGOGASTRODUODENOSCOPY: SHX5428

## 2011-11-22 LAB — BASIC METABOLIC PANEL
BUN: 11 mg/dL (ref 6–23)
CO2: 29 mEq/L (ref 19–32)
Calcium: 8.5 mg/dL (ref 8.4–10.5)
Creatinine, Ser: 0.72 mg/dL (ref 0.50–1.10)

## 2011-11-22 SURGERY — EGD (ESOPHAGOGASTRODUODENOSCOPY)
Anesthesia: Moderate Sedation

## 2011-11-22 MED ORDER — LEVOFLOXACIN IN D5W 750 MG/150ML IV SOLN
750.0000 mg | INTRAVENOUS | Status: DC
  Administered 2011-11-22 – 2011-11-24 (×3): 750 mg via INTRAVENOUS
  Filled 2011-11-22 (×3): qty 150

## 2011-11-22 MED ORDER — FENTANYL CITRATE 0.05 MG/ML IJ SOLN
INTRAMUSCULAR | Status: DC | PRN
  Administered 2011-11-22: 25 ug via INTRAVENOUS

## 2011-11-22 MED ORDER — FUROSEMIDE 10 MG/ML IJ SOLN
40.0000 mg | Freq: Once | INTRAMUSCULAR | Status: AC
  Administered 2011-11-22: 40 mg via INTRAVENOUS
  Filled 2011-11-22: qty 4

## 2011-11-22 MED ORDER — DEXTROSE 5 % IV SOLN
1.0000 g | Freq: Two times a day (BID) | INTRAVENOUS | Status: DC
  Administered 2011-11-22 – 2011-11-26 (×9): 1 g via INTRAVENOUS
  Filled 2011-11-22 (×10): qty 1

## 2011-11-22 MED ORDER — MORPHINE SULFATE 2 MG/ML IJ SOLN
1.0000 mg | INTRAMUSCULAR | Status: DC | PRN
  Administered 2011-11-26: 1 mg via INTRAVENOUS
  Filled 2011-11-22: qty 1

## 2011-11-22 MED ORDER — MIDAZOLAM HCL 10 MG/2ML IJ SOLN
INTRAMUSCULAR | Status: DC | PRN
  Administered 2011-11-22: 1 mg via INTRAVENOUS
  Administered 2011-11-22: 2 mg via INTRAVENOUS

## 2011-11-22 MED ORDER — PHENOL 1.4 % MT LIQD
2.0000 | OROMUCOSAL | Status: DC | PRN
  Administered 2011-11-22: 2 via OROMUCOSAL
  Filled 2011-11-22: qty 177

## 2011-11-22 MED ORDER — SODIUM CHLORIDE 0.9 % IV SOLN
500.0000 mg | Freq: Two times a day (BID) | INTRAVENOUS | Status: DC
  Administered 2011-11-22 – 2011-11-23 (×4): 500 mg via INTRAVENOUS
  Filled 2011-11-22 (×5): qty 500

## 2011-11-22 MED ORDER — LORAZEPAM 2 MG/ML IJ SOLN
1.0000 mg | Freq: Once | INTRAMUSCULAR | Status: AC
  Administered 2011-11-22 – 2011-11-23 (×2): 0.5 mg via INTRAVENOUS
  Filled 2011-11-22: qty 1

## 2011-11-22 NOTE — Progress Notes (Cosign Needed)
(  Late entry) 11/22/2011 0500  RN reports pt w/ persistent productive cough (thick brown secretions) and slight decrease in 02 sats. Temp 99.6. Port CXR shows findings c/w pulm edema but PNA can not be ruled out. Pt is also +2 L intake over last 24. Temp repeat 1 hr later 100.6.  BBS w/ crackles at bil bases, noted cough productive of thick brown secretions. Lasix 40 given IV and Maxipime, Levaquin and Vanc initiated per pharmacy dosing. Will monitor 02 sats continuous. Dr Kaylyn Layer aware of change in pt's status and plan.

## 2011-11-22 NOTE — Progress Notes (Signed)
Pt's O2 sats cont to remain in the low 90's even with oxygen at 2 liters. Pt denies discomfort or trouble breathing. When pt coughs, she has a congested cough and coughing up dk brown thick secretions. MD to be notified. [name]

## 2011-11-22 NOTE — Progress Notes (Signed)
ANTIBIOTIC CONSULT NOTE - INITIAL  Pharmacy Consult for Vancomycin, cefepime, levofloxacin Indication: pneumonia  Allergies  Allergen Reactions  . Erythromycin Ethylsuccinate Other (See Comments)    REACTION: unspecified  . Prednisone Other (See Comments)    REACTION: unspecified    Patient Measurements: Height: 5\' 3"  (160 cm) Weight: 121 lb 14.6 oz (55.3 kg) IBW/kg (Calculated) : 52.4  Adjusted Body Weight:   Vital Signs: Temp: 100.6 F (38.1 C) (04/23 0548) Temp src: Oral (04/23 0548) BP: 151/92 mmHg (04/23 0548) Pulse Rate: 92  (04/23 0548) Intake/Output from previous day: 04/22 0701 - 04/23 0700 In: 875 [I.V.:675; IV Piggyback:200] Out: 1500 [Urine:550; Emesis/NG output:950] Intake/Output from this shift: Total I/O In: -  Out: 1400 [Urine:550; Emesis/NG output:850]  Labs:  [facility] 11/22/11 0417 11/21/11 0406  WBC -- 7.1  HGB -- 14.1  PLT -- 238  LABCREA -- --  CREATININE 0.72 0.73   Estimated Creatinine Clearance: 46.4 ml/min (by C-G formula based on Cr of 0.72). No results found for this basename: VANCOTROUGH:2,VANCOPEAK:2,VANCORANDOM:2,GENTTROUGH:2,GENTPEAK:2,GENTRANDOM:2,TOBRATROUGH:2,TOBRAPEAK:2,TOBRARND:2,AMIKACINPEAK:2,AMIKACINTROU:2,AMIKACIN:2, in the last 72 hours   Microbiology: No results found for this or any previous visit (from the past 720 hour(s)).  Medical History: Past Medical History  Diagnosis Date  . GERD (gastroesophageal reflux disease)   . Osteoporosis   . Postmenopausal HRT (hormone replacement therapy)   . Esophageal cancer   . Anemia   . Hypertension   . Allergy   . Asthma   . Shortness of breath   . Pneumonia     hx of pneumonia  . PONV (postoperative nausea and vomiting)     Medications:  Anti-infectives     Start     Dose/Rate Route Frequency Ordered Stop   11/22/11 0600   levofloxacin (LEVAQUIN) IVPB 750 mg        750 mg 100 mL/hr over 90 Minutes Intravenous Every 24 hours 11/22/11 0600 11/30/11 0559   11/22/11 0600   ceFEPIme (MAXIPIME) 1 g in dextrose 5 % 50 mL IVPB        1 g 100 mL/hr over 30 Minutes Intravenous Every 12 hours 11/22/11 0600     11/22/11 0600   vancomycin (VANCOCIN) 500 mg in sodium chloride 0.9 % 100 mL IVPB        500 mg 100 mL/hr over 60 Minutes Intravenous Every 12 hours 11/22/11 0600           Assessment: Patient with PNA.    Goal of Therapy:  Vancomycin trough level 15-20 mcg/ml Levofloxacin/cefepime based on renal function  Plan:  Measure antibiotic drug levels at steady state Follow up culture results Vancomycin 500mg  iv q12hr Levofloacin 750mg  iv q24hr Cefepime 1gm iv q12hr Will watch renal function and adjust antibiotics if needed Aleene Davidson Crowford 11/22/2011,6:00 AM

## 2011-11-22 NOTE — Progress Notes (Signed)
#  16 NG tube inserted without difficulty. NG immediately started draining brown, coffee ground color with some sediment in it. Will cont to monitor. Sidney Ace

## 2011-11-22 NOTE — Progress Notes (Signed)
Patient ID: [name], female   DOB: 1929/01/03, 78 y.o.   MRN: [id number]   Discussed EGD findings with pt, and placement of a removable duodenal stent including risk of migration, perforation, ineffectiveness and she is willing to proceed. She understands her options are limited, other than J tube  and enteral feedings.  Procedure scheduled for tomorrow with Dr. Arlyce Dice. I also discussed with Patti her niece/ and POA

## 2011-11-22 NOTE — Interval H&P Note (Signed)
History and Physical Interval Note:  11/22/2011 10:59 AM  [name]  has presented today for surgery, with the diagnosis of Nausea and vomiting  The various methods of treatment have been discussed with the patient and family. After consideration of risks, benefits and other options for treatment, the patient has consented to  Procedure(s) (LRB): ESOPHAGOGASTRODUODENOSCOPY (EGD) (N/A) as a surgical intervention .  The patients' history has been reviewed, patient examined, no change in status, stable for surgery.  I have reviewed the patients' chart and labs.  Questions were answered to the patient's satisfaction.    The recent H&P (dated *11/21/11**) was reviewed, the patient was examined and there is no change in the patients condition since that H&P was completed.   Melvia Heaps  11/22/2011, 10:59 AM    Melvia Heaps

## 2011-11-22 NOTE — Progress Notes (Signed)
Patient ID: [name], female   DOB: [birth year], 78 y.o.   MRN: [id number] [facility] Progress Note  Subjective: Miserable with NG in... Says retching did eventually stop, minimal drainage currently  Objective:  Vital signs in last 24 hours: Temp:  [97.8 F (36.6 C)-100.6 F (38.1 C)] 100.6 F (38.1 C) (04/23 0548) Pulse Rate:  [72-98] 92  (04/23 0548) Resp:  [16-18] 16  (04/23 0548) BP: (120-151)/(87-92) 151/92 mmHg (04/23 0548) SpO2:  [79 %-95 %] 95 % (04/23 0548) Weight:  [121 lb 14.6 oz (55.3 kg)] 121 lb 14.6 oz (55.3 kg) (04/23 0548) Last BM Date: 11/19/11 General:   Alert,  Well-developed,    in NAD Heart:  Regular rate and rhythm; no murmurs Pulm;clear ant Abdomen:  Soft, nontender and nondistended. Normal bowel sounds, without guarding,  Extremities:  Without edema. Neurologic:  Alert and  oriented x4;  grossly normal neurologically. Psych:  Alert and cooperative. Normal mood and affect.  Intake/Output from previous day: 04/22 0701 - 04/23 0700 In: 2559 [I.V.:2359; IV Piggyback:200] Out: 1900 [Urine:950; Emesis/NG output:950] Intake/Output this shift: Total I/O In: -  Out: 1000 [Urine:1000]  Lab Results:  [facility] 11/21/11 0406  WBC 7.1  HGB 14.1  HCT 44.0  PLT 238   BMET  Basename 11/22/11 0417 11/21/11 0406  NA 136 142  K 3.5 2.8*  CL 100 101  CO2 29 30  GLUCOSE 179* 136*  BUN 11 6  CREATININE 0.72 0.73  CALCIUM 8.5 9.1      Assessment / Plan: #1 78 yo female s/p esophagectomy for esophageal cancer with gastric pullup. Now with recurrent regurgitation, and inability to eat.  Gastric emptying scan will not change management,will cancel She is scheduled for EGD to reassess for volvulus today, hopefully can leave ng out after procedure  Dr. Sena Hitch consulted  #2 nutrition- sh will need alternate nutrition soon, cannot do PEG of any sort-would need surgical J tube if longer term source needed. Principal Problem:   *Vomiting Active Problems:  ANEMIA-IRON DEFICIENCY  HYPERTENSION  GERD  HX OF ESOPHAGEAL CANCER  Asthma with bronchitis  Esophageal stricture  Gastric outlet obstruction  Hypokalemia     LOS: 5 days   [name]  11/22/2011, 9:26 AM

## 2011-11-22 NOTE — Progress Notes (Signed)
Pt's O2 sat at 79, oxygen at 2 l/[place] started and sat went up to 92%. Will cont to monitor. Sidney Ace

## 2011-11-22 NOTE — Progress Notes (Signed)
Patient ID: [name], female   DOB: Aug 29, 1928, 78 y.o.   MRN: [id number]  Interim summary:  78 year old female with multiple medical comorbidities including but not limited to esophageal cancer status post resection in the past presented to [facility] ED 4 days ago for intractable nausea and vomiting. This has been a chronic issue for the patient and she has had endoscopy done 11/06/2028 and it showed anastomosis in the proximal esophagus but no obvious gastric outlet obstruction. Patient has has EGD done today and findings are consistant with functional gastric obstruction.  Consults to date:  1. Gen. Surgery (Dr. Gerrit Friends)  2. cardiothoracic surgery (Dr. Tyrone Sage)  3. GI (Dr. Marina Goodell)  4. Physical therapy - no PT follow up recomended   Assessment/Plan   Principal Problem:   *Nausea and Vomiting  - likely secondary to functional gastric outlet obstruction  - CTS following - Followup EGD findings as above with plan to put stent in am  - GI is following  - continue Zofran for nausea and phenergan for vomiting  - patient is already on Reglan in addition to those medications so while this may a therapeutic duplication this is actually patient home medication regimen   Active Problems:   Constipation  - continue colace  - bisacodyl was given 1 time only - patient has not been eating much and perhaps this is the reason why today she has not had bowel movement  Hypokalemia  - likely secondary to GI losses  - repleted  - follow up BMP in a.m.   Gastric outlet obstruction, functional  - secondary to resection due to esophageal cancer  - GI and surgery following   ANEMIA-IRON DEFICIENCY  - hemoglobin stable   HYPERTENSION  - continue Bisoprolol/Hctz  - BP at goal    GERD  - continue Protonix 80 mg twice a day   HX OF ESOPHAGEAL CANCER  - status post resection   Asthma with bronchitis  - stable on admission  - no need for nebulizer treatment at this time  - continue  albuterol HFA   Esophageal stricture  - continue protonix 80 mg BID   DVT Prophylaxis - SCD bilaterally   Code Status - full code   Education  - test results and diagnostic studies were discussed with patient and patient's sister [name] in length - questions were answered at the bedside and contact information was provided for additional questions or concerns   Disposition  - follow up PT evaluation  Subjective: No events overnight. Patient reports still feeling nausea. She has returned from endoscopy and feels very tired.  Objective:  Vital signs in last 24 hours:  Filed Vitals:   11/22/11 1150 11/22/11 1219 11/22/11 1335 11/22/11 1844  BP: 111/59 107/70 113/74 91/67  Pulse:  63 65 93  Temp:  98.4 F (36.9 C) 99.4 F (37.4 C) 99.5 F (37.5 C)  TempSrc:  Axillary Oral Oral  Resp: 18 16 16 16   Height:      Weight:      SpO2: 93% 95% 94% 95%    Intake/Output from previous day:   Intake/Output Summary (Last 24 hours) at 11/22/11 2205 Last data filed at 11/22/11 1743  Gross per 24 hour  Intake 2737.75 ml  Output   3750 ml  Net -1012.25 ml    Physical Exam: General: Alert, awake, oriented x3, in no acute distress. HEENT: No bruits, no goiter. Moist mucous membranes, no scleral icterus, no conjunctival pallor. Heart: Regular rate  and rhythm, S1/S2 +, no murmurs, rubs, gallops. Lungs: patient does not have good respiratory effort but there is a bilateral air entry.  Abdomen: Soft, nontender, nondistended, positive bowel sounds. Extremities: No clubbing or cyanosis, LE (+1) pitting edema,  positive pedal pulses. Neuro: Grossly nonfocal.  Lab Results:   Lab 11/21/11 0406 11/19/11 0347 11/18/11 0340 11/17/11 1240  WBC 7.1 3.8* 4.5 5.8  HGB 14.1 12.0 11.8* 13.8  HCT 44.0 38.2 37.6 42.4  PLT 238 219 214 262  MCV 87.1 88.6 88.9 86.9    Lab 11/22/11 0417 11/21/11 0406 11/19/11 0347 11/18/11 0340 11/17/11 1908 11/17/11 1240  NA 136 142 140 140 -- 138  K 3.5  2.8* 3.6 3.0* -- 3.6  CL 100 101 107 103 -- 99  CO2 29 30 27 31  -- 30  GLUCOSE 179* 136* 81 85 -- 105*  BUN 11 6 7 9  -- 10  CREATININE 0.72 0.73 0.73 0.84 -- 0.74  CALCIUM 8.5 9.1 8.7 8.6 -- 9.4    Lab 11/17/11 1908  INR 1.10  PROTIME --    Medications: Scheduled Meds:   . antiseptic oral rinse  15 mL Mouth Rinse BID  . bisacodyl  10 mg Rectal Once  . bisoprolol-hydrochlorothiazide  1 tablet Oral Daily  . calcium-vitamin D  1 tablet Oral TID WC  . ceFEPime (MAXIPIME) IV  1 g Intravenous Q12H  . docusate sodium  100 mg Oral BID  . furosemide  40 mg Intravenous Once  . Hemocyte Plus  106 mg Oral BID  . levofloxacin (LEVAQUIN) IV  750 mg Intravenous Q24H  . loratadine  5 mg Oral QPM  . LORazepam  1 mg Intravenous Once  . magic mouthwash w/lidocaine  5 mL Oral TID  . metoCLOPramide  5 mg Oral TID AC  . potassium chloride  10 mEq Intravenous Q1 Hr x 6  . sucralfate  1 g Oral BID AC  . vancomycin  500 mg Intravenous Q12H  . Vitamin D (Ergocalciferol)  50,000 Units Oral Q7 days  . DISCONTD: pantoprazole  80 mg Oral BID AC   Continuous Infusions:   . sodium chloride 75 mL/hr at 11/22/11 0349   PRN Meds:.acetaminophen, acetaminophen, albuterol, bisacodyl, morphine injection, ondansetron, phenol, promethazine, zolpidem, DISCONTD: fentaNYL, DISCONTD: midazolam, DISCONTD:  morphine injection   LOS: 5 days   Sabastien Tyler 11/22/2011, 10:05 PM  [facility] Pager: [phone]

## 2011-11-22 NOTE — Op Note (Signed)
[facility] [address] [place], [place]  [address]  ENDOSCOPY PROCEDURE REPORT  PATIENT:  [name], [name]  MR#:  [id number] BIRTHDATE:  [birth year], 82 yrs. old  GENDER:  female  ENDOSCOPIST:  Barbette Hair. Arlyce Dice, MD Referred by:  Stacie Glaze, M.D.  PROCEDURE DATE:  11/22/2011 PROCEDURE:  EGD, diagnostic 43235 ASA CLASS:  Class II INDICATIONS:  nausea and vomiting  MEDICATIONS:   These medications were titrated to patient response per physician's verbal order, Fentanyl 25 mcg IV, Versed 3 mg IV TOPICAL ANESTHETIC:  Cetacaine Spray  DESCRIPTION OF PROCEDURE:   After the risks and benefits of the procedure were explained, informed consent was obtained.  The Pentax Gastroscope [id number] endoscope was introduced through the mouth and advanced to the third portion of the duodenum.  The instrument was slowly withdrawn as the mucosa was fully examined. <<PROCEDUREIMAGES>>  Post-operative change was noted. foreshortened esophagus from prior resection. GE junction is at approximately 22 cm from the incisors.  A stricture was found at the gastroesophageal junction. there is a early stricture at the GE junction. The 9.5 mm scope easily passes through the area (see image4).  stenosis. beginning at the pylorus and extending approximately 9 cm the duodenum is angular, collapased and slightly stenotic. The scope passes through without resistance.  other findings. There is retained fluid in the stomach  Otherwise the examination was normal (see image2 and image3).    Retroflexed views revealed no abnormalities.    The scope was then withdrawn from the patient and the procedure completed.  COMPLICATIONS:  None  ENDOSCOPIC IMPRESSION: 1) Post-operative change (s/p esophagectomy) 2) Minmal Stricture at the gastroesophageal junction 3) Stenosis at pylorus  4) Otherwise normal examination  RECOMMENDATIONS:Placement of a duodenal stent to determine if relieves functional  obstruction  ______________________________ Barbette Hair. Arlyce Dice, MD  CC:  Sheliah Plane, MD  n. Rosalie Doctor:   Barbette Hair. Amillya Chavira at 11/22/2011 11:31 AM  Candie Chroman, [phone]

## 2011-11-22 NOTE — Progress Notes (Signed)
Panendoscopy is significant for a very tortuous and collapsed pylorus and proximal small bowel. There is also retained gastric fluid. Findings suggest a functional obstruction of the stomach.  I think it is worthwhile placing a duodenal stent encompassing the pylorus and her proximal small bowel that will help maintain patency. This will not help if her symptoms are due to  gastroparesis. If she has a mechanical obstruction this could be very useful and provide a temporary or, perhaps, permanent nonsurgical fix.  I will discuss with the patient.

## 2011-11-23 ENCOUNTER — Encounter (HOSPITAL_COMMUNITY)
Admission: EM | Disposition: A | Discharge status: Skilled Nursing Facility | Payer: Self-pay | Source: Home / Self Care | Attending: Internal Medicine

## 2011-11-23 ENCOUNTER — Telehealth: Payer: Self-pay | Admitting: Internal Medicine

## 2011-11-23 ENCOUNTER — Encounter (HOSPITAL_COMMUNITY): Payer: Self-pay | Admitting: Gastroenterology

## 2011-11-23 DIAGNOSIS — E43 Unspecified severe protein-calorie malnutrition: Secondary | ICD-10-CM | POA: Diagnosis present

## 2011-11-23 DIAGNOSIS — I4891 Unspecified atrial fibrillation: Secondary | ICD-10-CM

## 2011-11-23 DIAGNOSIS — K5900 Constipation, unspecified: Secondary | ICD-10-CM | POA: Diagnosis present

## 2011-11-23 HISTORY — DX: Unspecified atrial fibrillation: I48.91

## 2011-11-23 SURGERY — EGD (ESOPHAGOGASTRODUODENOSCOPY)
Anesthesia: Moderate Sedation

## 2011-11-23 MED ORDER — LORAZEPAM 2 MG/ML IJ SOLN
1.0000 mg | Freq: Once | INTRAMUSCULAR | Status: AC
  Administered 2011-11-24: 1 mg via INTRAVENOUS
  Filled 2011-11-23: qty 1

## 2011-11-23 MED ORDER — FLEET ENEMA 7-19 GM/118ML RE ENEM: 1.0000 | ENEMA | Freq: Once | RECTAL | Status: DC

## 2011-11-23 MED ORDER — DIGOXIN 0.25 MG/ML IJ SOLN
0.2500 mg | INTRAMUSCULAR | Status: AC
  Administered 2011-11-23 (×2): 0.25 mg via INTRAVENOUS
  Filled 2011-11-23 (×2): qty 1

## 2011-11-23 MED ORDER — VANCOMYCIN HCL 500 MG IV SOLR
500.0000 mg | Freq: Three times a day (TID) | INTRAVENOUS | Status: DC
  Administered 2011-11-24 – 2011-11-26 (×6): 500 mg via INTRAVENOUS
  Filled 2011-11-23 (×10): qty 500

## 2011-11-23 MED ORDER — DIGOXIN 0.25 MG/ML IJ SOLN
0.1250 mg | Freq: Every day | INTRAMUSCULAR | Status: DC
  Administered 2011-11-24 – 2011-11-28 (×4): 0.125 mg via INTRAVENOUS
  Filled 2011-11-23 (×9): qty 0.5

## 2011-11-23 MED ORDER — ALBUTEROL SULFATE (5 MG/ML) 0.5% IN NEBU
2.5000 mg | INHALATION_SOLUTION | RESPIRATORY_TRACT | Status: DC | PRN
  Administered 2011-11-23 – 2011-11-24 (×2): 2.5 mg via RESPIRATORY_TRACT
  Filled 2011-11-23 (×2): qty 0.5

## 2011-11-23 NOTE — Telephone Encounter (Signed)
Family wants to know if her heart rate goes up here in the office when pt is badly congested.  They are putting in a Pyloric Stint today.  Her stomach had to be pumped yesterday.

## 2011-11-23 NOTE — Telephone Encounter (Signed)
Pt oxygen is dropping and heart rate is increasing, pt has been seen by Dr, Lovell Sheehan for this before  and had a question for you and is requesting a phone call back

## 2011-11-23 NOTE — Progress Notes (Signed)
Patient requested something "to help me rest". Paged NP. Order for Ativan x1 dose ordered.

## 2011-11-23 NOTE — Progress Notes (Signed)
Patient ID: [name], female   DOB: 09-07-1928, 78 y.o.   MRN: [id number]   Pt's heart rate still near 150, will cancel EGD for today, reschedule for tomorrow, allow clear liquids today

## 2011-11-23 NOTE — Telephone Encounter (Signed)
Deb ,can you triage? 

## 2011-11-23 NOTE — Progress Notes (Signed)
MEDICATION RELATED CONSULT NOTE - INITIAL   Pharmacy Consult for digoxin Indication: Rapid Afib  Allergies  Allergen Reactions  . Erythromycin Ethylsuccinate Other (See Comments)    REACTION: unspecified  . Prednisone Other (See Comments)    REACTION: unspecified    Patient Measurements: Height: 5\' 3"  (160 cm) Weight: 121 lb (54.885 kg) IBW/kg (Calculated) : 52.4   Vital Signs: Temp: 97.7 F (36.5 C) (04/24 0637) Temp src: Oral (04/24 0637) BP: 108/64 mmHg (04/24 0637) Pulse Rate: 72  (04/24 0637) Intake/Output from previous day: 04/23 0701 - 04/24 0700 In: 1943.5 [I.V.:1643.5; IV Piggyback:300] Out: 2425 [Urine:2225; Emesis/NG output:200] Intake/Output from this shift:    Labs:  Basename 11/22/11 0417 11/21/11 0406  WBC -- 7.1  HGB -- 14.1  HCT -- 44.0  PLT -- 238  APTT -- --  CREATININE 0.72 0.73  LABCREA -- --  CREATININE 0.72 0.73  CREAT24HRUR -- --  MG -- --  PHOS -- --  ALBUMIN -- --  PROT -- --  ALBUMIN -- --  AST -- --  ALT -- --  ALKPHOS -- --  BILITOT -- --  BILIDIR -- --  IBILI -- --   Estimated Creatinine Clearance: 46.4 ml/min (by C-G formula based on Cr of 0.72).   Microbiology: No results found for this or any previous visit (from the past 720 hour(s)).  Medical History: Past Medical History  Diagnosis Date  . GERD (gastroesophageal reflux disease)   . Osteoporosis   . Postmenopausal HRT (hormone replacement therapy)   . Anemia   . Hypertension   . Allergy   . Asthma   . Shortness of breath   . PONV (postoperative nausea and vomiting)   . Pneumonia      Hx of pneumonia,80months ago  . Esophageal cancer     removed esophagus stomach pulled up    Medications:  Scheduled:    . antiseptic oral rinse  15 mL Mouth Rinse BID  . bisacodyl  10 mg Rectal Once  . bisoprolol-hydrochlorothiazide  1 tablet Oral Daily  . calcium-vitamin D  1 tablet Oral TID WC  . ceFEPime (MAXIPIME) IV  1 g Intravenous Q12H  . docusate sodium  100  mg Oral BID  . Hemocyte Plus  106 mg Oral BID  . levofloxacin (LEVAQUIN) IV  750 mg Intravenous Q24H  . loratadine  5 mg Oral QPM  . LORazepam  1 mg Intravenous Once  . magic mouthwash w/lidocaine  5 mL Oral TID  . metoCLOPramide  5 mg Oral TID AC  . sucralfate  1 g Oral BID AC  . vancomycin  500 mg Intravenous Q12H  . Vitamin D (Ergocalciferol)  50,000 Units Oral Q7 days  . DISCONTD: pantoprazole  80 mg Oral BID AC   Infusions:    . sodium chloride 1,000 mL (11/22/11 2100)    Assessment: 78 yo female found to be in rapid Afib. Patient with soft BP so consult was placed for digoxin dosing per pharmacy rather than another med such as diltiazem due to effect on BP. Patient with stable SCr  Goal of Therapy:  Digoxin level 0.5-2 but aim lower due to age of 78 yo  Plan:  1. Digoxin 0.25mg  IV every 2 hours x 2 doses then 2. Digoxin 0.125mg  IV daily starting tomorrow 3. Change digoxin to PO as soon as possible 4. Digoxin level as needed   Hessie Knows, PharmD, BCPS Pager [phone] 11/23/2011 9:11 AM

## 2011-11-23 NOTE — Progress Notes (Signed)
Patient ID: [name], female   DOB: 1929-05-29, 78 y.o.   MRN: [id number] [facility] Progress Note  Subjective: Feels down today- didn't know she was having any heart trouble- but in rapid afib today-rate in 150's, feels a little SOB, no vomiting today. Has been several days since she was able to eat Objective:  Vital signs in last 24 hours: Temp:  [97.7 F (36.5 C)-99.5 F (37.5 C)] 97.7 F (36.5 C) (04/24 0637) Pulse Rate:  [63-96] 72  (04/24 0637) Resp:  [16-27] 16  (04/24 0637) BP: (83-132)/(33-78) 108/64 mmHg (04/24 0637) SpO2:  [88 %-99 %] 96 % (04/24 0637) Weight:  [121 lb (54.885 kg)] 121 lb (54.885 kg) (04/23 1040) Last BM Date: 11/20/11 General:   Alert,  Well-developed, chronically ill appearing   in NAD Heart: rapid irregular   Pulm;scattered rhonchi Abdomen:  Soft, nontender and nondistended. Normal bowel sounds, without guarding, and without rebound.   Extremities:  Without edema. Neurologic:  Alert and  oriented x4;  grossly normal neurologically. Psych:  Alert and cooperative. Normal mood and affect.  Intake/Output from previous day: 04/23 0701 - 04/24 0700 In: 1943.5 [I.V.:1643.5; IV Piggyback:300] Out: 2425 [Urine:2225; Emesis/NG output:200] Intake/Output this shift:    Lab Results:  Basename 11/21/11 0406  WBC 7.1  HGB 14.1  HCT 44.0  PLT 238   BMET  Basename 11/22/11 0417 11/21/11 0406  NA 136 142  K 3.5 2.8*  CL 100 101  CO2 29 30  GLUCOSE 179* 136*  BUN 11 6  CREATININE 0.72 0.73  CALCIUM 8.5 9.1    Assessment / Plan: #1 78 yo female with hx of esophageal ca -s/p esophagectomy  With gastric pull up with gastric outlet obstructive sxs- tortuos pylorus with functional obstruction- pt was for EGD with Stent placement today Will probably need to cancel  For today #2 rapid a fib- getting dig loaded #3 r/o aspiration pneumonia-on maxipime,levaquin #4 nutrition- she needs alternative nutrition at this point- as even with  stent not sure how much she will be able to eat. Consider TPN  Principal Problem:  *Vomiting Active Problems:  ANEMIA-IRON DEFICIENCY  HYPERTENSION  GERD  HX OF ESOPHAGEAL CANCER  Asthma with bronchitis  Esophageal stricture  Gastric outlet obstruction  Hypokalemia  Atrial fibrillation with rapid ventricular response  Constipation  Severe malnutrition     LOS: 6 days   Esmirna Ravan  11/23/2011, 10:18 AM

## 2011-11-23 NOTE — Progress Notes (Addendum)
ANTIBIOTIC CONSULT NOTE - FOLLOW UP  Pharmacy Consult for Vancomycin Indication: Pneumonia  Allergies  Allergen Reactions  . Erythromycin Ethylsuccinate Other (See Comments)    REACTION: unspecified  . Prednisone Other (See Comments)    REACTION: unspecified    Patient Measurements: Height: 5\' 3"  (160 cm) Weight: 121 lb (54.885 kg) IBW/kg (Calculated) : 52.4   Vital Signs: Temp: 98.3 F (36.8 C) (04/24 2213) Temp src: Oral (04/24 2213) BP: 112/76 mmHg (04/24 2213) Pulse Rate: 106  (04/24 2213)  Intake/Output from previous day: 04/23 0701 - 04/24 0700 In: 1943.5 [I.V.:1643.5; IV Piggyback:300] Out: 2425 [Urine:2225; Emesis/NG output:200] Intake/Output from this shift: Total I/O In: 360 [P.O.:360] Out: 250 [Urine:250]  Labs:  [facility] 11/22/11 0417 11/21/11 0406  WBC -- 7.1  HGB -- 14.1  PLT -- 238  LABCREA -- --  CREATININE 0.72 0.73   Estimated Creatinine Clearance: 46.4 ml/min (by C-G formula based on Cr of 0.72).  11/23/11  17:21 PM  Vancomycin trough level 8.8 mcg/ml   Microbiology: No results found for this or any previous visit (from the past 720 hour(s)).  Anti-infectives: Day 2 - Cefepime 1 gram IV every 12 hours Day 2 - Levofloxacin 750 mg IV every 24 hours Day 2 - Vancomycin 500 mg IV every 12 hours  Assessment: The Vancomycin trough level is below the desired range; an increase in dosage is needed.  Goal of Therapy:   Trough levels 15-20 mcg/ml  Eradication of infection  Plan:  Increase the Vancomycin dose to 500 mg IV every 8 hours. Repeat the trough level after 3 doses.  Polo Riley R.Ph. 11/23/2011,10:40 PM   Addendum: For CrCl ~ 46, decrease Levofloxacin to q48 hour dosing and recheck VT 4/26 AM.  Lynann Beaver PharmD, BCPS Pager [phone] 11/24/2011 9:14 AM

## 2011-11-23 NOTE — Telephone Encounter (Signed)
Yes it does per dr Lovell Sheehan

## 2011-11-23 NOTE — Progress Notes (Signed)
PROGRESS NOTE  [name]:[id number] DOB: August 29, 1928 DOA: 11/17/2011 PCP: Carrie Mew, MD, MD  Brief narrative: [name] is an 78 year old female with a past medical history of esophageal cancer and esophageal stricture who was brought to the hospital on 11/17/2011 with a 2-3 month history of intractable nausea and vomiting.  Assessment/Plan: Principal Problem:  *Vomiting secondary to multifactorial causes including: Esophageal stricture and Gastric outlet obstruction in a patient with a history of esophageal cancer status post surgical resection, gastroparesis, volvulus.  Gastroenterology consultation performed by Dr. Marina Goodell on 11/17/2011.  Status post EGD 10/19/2011 showed her anastomosis to be at 17 cm with stenosis at the anastomosis, which was dilated.  EGD at that time also showed gastritis with biopsies negative for H. Pylori.  Hospitalized 11/05/2011-11/10/2011 for what was thought to be a gastroparesis flare. An upper GI series was suspicious for delayed gastric emptying and an EGD failed to reveal any evidence of gastric outlet obstruction. She responded to NG tube decompression.  Zofran and Phenergan ordered on admission for symptomatic treatment.  On Reglan chronically to address gastroparesis.  Dr. Gerrit Friends consulted on 11/18/2011 per Dr. Lamar Sprinkles recommendations and no surgical intervention recommended.  Further evaluation done by Dr. Tyrone Sage on 11/21/2011 with recommendations for consideration of balloon dilatation of the pyloroplasty versus placement of a jejunostomy tube with enteral tube feedings.  Diet advanced to regular on 11/19/2011, but could not tolerate this for a long.  NG tube for gastric decompression ordered by the gastroenterologists on 11/21/2011.  EGD performed on 11/22/2011 which showed a very tortuous and collapsed pylorus and proximal small bowel. Functional obstruction was noted. Duodenal stent encompassing the pylorus and her proximal  small bowel recommended , with plans to proceed today. Active Problems:  Aspiration pneumonia  The patient was found to be hypoxic on 11/22/2011.  A chest x-ray done on 11/22/2011 showed perihilar infiltrates concerning for pulmonary edema.  Patient was put on broad-spectrum antibiotics as noted below, for treatment of presumed aspiration pneumonia.  ANEMIA-IRON DEFICIENCY  Hemoglobin 11.8-14.1, stable.  HYPERTENSION  Blood pressure 90-108/62-73  Continue bisoprolol/HCTZ.  GERD  Continue Prevacid.  HX OF ESOPHAGEAL CANCER  No evidence of recurrence.  Asthma with bronchitis  Stable on when necessary albuterol HFA.  Atrial fibrillation with rapid ventricular response  Nursing staff reported an irregular and rapid heart rate on 11/23/2011.  12-lead EKG confirms atrial fibrillation with rapid ventricular response, ventricular rate 154 beats per minute  Blood pressure soft, so will start on digoxin per pharmacy protocol.  Constipation  Seen on abdominal films 4/18-4/19.  Given a Dulcolax suppository on 11/18/2011.  Started on [facility] 11/18/2011.  Fleets enema 11/23/2011.  Hypokalemia  Secondary to GI losses.  Replaced.  Severe malnutrition  Related to intractable nausea and vomiting.  Seen by dietitian on 11/18/2011.  BMI 20.69.  Consider jejunostomy tube feeding per gastroenterology CVTS recommendations.  Hypoxia  Low oxygen levels noted after NG tube placement on 11/22/2011.  Placed on supplemental oxygen.   Code Status: Full code Family Communication: [name] [phone], cell [phone], updated  Disposition Plan: Home, when stable.  Medical Consultants:  Dr. Yancey Flemings, Gastroenterology  Dr. Darnell Level, Surgery  Dr. Sheliah Plane, Cardiac and Thoracic Surgery  Other consultants:  Physical therapy: No further physical therapy needed.  Dietitian  Pharmacy for antibiotic dosing.  Antibiotics:  Cefepime 11/22/2011--->  Vancomycin  11/22/2011--->  Levaquin 11/22/2011--->   Subjective  [name] feels well.  She denies abdominal pain.  States her bowels have not moved  since admission.   Objective    Interim History: The patient has gone into atrial fibrillation.     Objective: Filed Vitals:   2011-12-22 2050 Dec 22, 2011 2332 11/23/11 0215 11/23/11 0637  BP:  104/73 90/62 108/64  Pulse: 94 96 80 72  Temp:  99.1 F (37.3 C) 98.3 F (36.8 C) 97.7 F (36.5 C)  TempSrc:  Oral Oral Oral  Resp:  17 16 16   Height:      Weight:      SpO2:  97% 99% 96%    Intake/Output Summary (Last 24 hours) at 11/23/11 0848 Last data filed at 11/23/11 0700  Gross per 24 hour  Intake 1943.45 ml  Output   2025 ml  Net -81.55 ml    Exam: Gen:  NAD Cardiovascular:  Heart sounds irregularly irregular and tachycardic. Respiratory: Lungs CTAB Gastrointestinal: Abdomen soft, NT/ND with normal active bowel sounds. Extremities: No C/E/C    Data Reviewed: Basic Metabolic Panel:  Lab Dec 22, 2011 1610 11/21/11 0406 11/19/11 0347 11/18/11 0340 11/17/11 1908 11/17/11 1240  NA 136 142 140 140 -- 138  K 3.5 2.8* -- -- -- --  CL 100 101 107 103 -- 99  CO2 29 30 27 31  -- 30  GLUCOSE 179* 136* 81 85 -- 105*  BUN 11 6 7 9  -- 10  CREATININE 0.72 0.73 0.73 0.84 -- 0.74  CALCIUM 8.5 9.1 8.7 8.6 -- 9.4  MG -- -- -- -- 2.0 --  PHOS -- -- -- -- 3.8 --   GFR Estimated Creatinine Clearance: 46.4 ml/min (by C-G formula based on Cr of 0.72). Liver Function Tests:  Lab 11/18/11 0340 11/17/11 1240  AST 10 16  ALT 6 7  ALKPHOS 49 58  BILITOT 0.5 0.5  PROT 5.0* 6.2  ALBUMIN 2.9* 3.5    Lab 11/17/11 1240  LIPASE 29  AMYLASE --   Coagulation profile  Lab 11/17/11 1908  INR 1.10  PROTIME --    CBC:  Lab 11/21/11 0406 11/19/11 0347 11/18/11 0340 11/17/11 1240  WBC 7.1 3.8* 4.5 5.8  NEUTROABS -- -- -- 2.9  HGB 14.1 12.0 11.8* 13.8  HCT 44.0 38.2 37.6 42.4  MCV 87.1 88.6 88.9 86.9  PLT 238 219 214 262   CBG:  Lab  11/21/11 0740 11/20/11 0726 11/19/11 0735 11/18/11 0739  GLUCAP 115* 84 78 88    Procedures and Diagnostic Studies:    Dg Chest Port 1 View 12-22-2011  IMPRESSION: Cardiac enlargement with increasing pulmonary vascular congestion and interstitial and perihilar edema.  Enteric tube coiled in the mediastinum, likely within the intrathoracic stomach.  Original Report Authenticated By: Marlon Pel, M.D.    Dg Abd Acute W/chest 11/17/2011 IMPRESSION: Constipation.  Original Report Authenticated By: Reyes Ivan, M.D.    Dg Abd Portable 1v 11/18/2011  IMPRESSION: Moderate stool in the rectosigmoid colon.  Original Report Authenticated By: Jamesetta Orleans. MATTERN, M.D.    Scheduled Meds:   . antiseptic oral rinse  15 mL Mouth Rinse BID  . bisacodyl  10 mg Rectal Once  . bisoprolol-hydrochlorothiazide  1 tablet Oral Daily  . calcium-vitamin D  1 tablet Oral TID WC  . ceFEPime (MAXIPIME) IV  1 g Intravenous Q12H  . docusate sodium  100 mg Oral BID  . Hemocyte Plus  106 mg Oral BID  . levofloxacin (LEVAQUIN) IV  750 mg Intravenous Q24H  . loratadine  5 mg Oral QPM  . LORazepam  1 mg Intravenous Once  . magic mouthwash  w/lidocaine  5 mL Oral TID  . metoCLOPramide  5 mg Oral TID AC  . sucralfate  1 g Oral BID AC  . vancomycin  500 mg Intravenous Q12H  . Vitamin D (Ergocalciferol)  50,000 Units Oral Q7 days  . DISCONTD: pantoprazole  80 mg Oral BID AC   Continuous Infusions:   . sodium chloride 1,000 mL (11/22/11 2100)      LOS: 6 days   Hillery Aldo, MD Pager [phone]  11/23/2011, 8:48 AM   Patient Teaching (information given to and reviewed with the patient): DARRIAN GOODWILL 11/23/2011 Hospitalist: Dr. Trula Ore Arshdeep Bolger  Medical Issues and plan: Principal Problem:  *Vomiting secondary to multifactorial causes including: Esophageal stricture and Gastric outlet obstruction at prior surgery site, sluggish emptying of the stomach, twisting of the stomach  around scar tissue.  Gastroenterology consultation performed by Dr. Marina Goodell on 11/17/2011.  Status post EGD 10/19/2011, scar tissue dilated   EGD at that time also showed stomach inflammation with biopsies negative for H. Pylori., a infection and is sometimes associated with this condition  Hospitalized 11/05/2011-11/10/2011 for what was thought to be a gastroparesis flare. An upper GI series showed sluggish emptying of the stomach and an EGD failed to reveal any evidence of gastric outlet obstruction. NG tube placement helped to alleviate your symptoms.  Zofran and Phenergan ordered on admission for symptomatic treatment of nausea and vomiting.  On Reglan chronically to address sluggish emptying of the stomach.  Dr. Gerrit Friends consulted on 11/18/2011 per Dr. Lamar Sprinkles recommendations and no surgical intervention recommended.  Further evaluation done by Dr. Tyrone Sage on 11/21/2011 with recommendations for consideration of balloon dilatation of the pyloroplasty versus placement of a jejunostomy tube with enteral tube feedings.  Diet advanced to regular on 11/19/2011, but could not tolerate this for a long.  NG tube for gastric decompression ordered by the gastroenterologists on 11/21/2011.  EGD performed on 11/22/2011 which showed a very tortuous and collapsed pylorus (end of the stomach where it enters the small bowel) and proximal small bowel. Functional obstruction was noted. Duodenal stent encompassing the pylorus and proximal small bowel recommended , with plans to proceed today. Active Problems:  Possible Aspiration pneumonia (pneumonia caused by inhaling vomiting into the lungs)  The patient was found to be hypoxic on 11/22/2011.  A chest x-ray done on 11/22/2011 showed perihilar infiltrates concerning for pulmonary edema. This could represent fluid on the lungs that is noninfectious or an early infection.  Broad-spectrum antibiotics started on 11/22/2011. Low blood count  Hemoglobin 11.8-14.1,  stable. High blood pressure  Blood pressure 90-108/62-73  Continue bisoprolol/HCTZ. Reflux  Continue Prevacid.  HX OF ESOPHAGEAL CANCER  No evidence of recurrence.  Asthma with bronchitis  Stable on when necessary albuterol HFA. Irregular and fast heart beat  Nursing staff reported an irregular and rapid heart rate on 11/23/2011.  12-lead EKG confirms an irregular and fast heart beat.  Blood pressure on the low side, so will start on digoxin per pharmacy protocol which is a medicine used to control heart rate without drop in blood pressure further.  Constipation  Seen on abdominal films 4/18-4/19.  Given a Dulcolax suppository on 11/18/2011.  Started on [facility] 11/18/2011.  Fleets enema given 11/23/2011 Low potassium  Secondary to GI losses.  Replaced.  Severe malnutrition  Related to intractable nausea and vomiting.  Seen by dietitian on 11/18/2011.  BMI 20.69.  Consider jejunostomy tube feeding per gastroenterology CVTS recommendations. Low oxygen levels  Low oxygen levels noted after NG  tube placement on 11/22/2011.  Placed on supplemental oxygen  Treatment Team: Medical Consultants:  Dr. Yancey Flemings, Gastroenterology  Dr. Darnell Level, Surgery  Dr. Sheliah Plane, Cardiac and Thoracic Surgery  Other consultants:  Physical therapy: No further physical therapy needed.  Dietitian  Pharmacy for antibiotic dosing.

## 2011-11-23 NOTE — Progress Notes (Signed)
   CARE MANAGEMENT NOTE 11/23/2011  Patient:  [name], [name]   Account Number:  [id number]  Date Initiated:  11/23/2011  Documentation initiated by:  Lanier Clam  Subjective/Objective Assessment:   TRANSFERRED TO TELE-[facility],[email or web address] CA.     Action/Plan:   FROM HOME ALONE   Anticipated DC Date:  11/28/2011   Anticipated DC Plan:  HOME W HOME HEALTH SERVICES         Choice offered to / List presented to:             Status of service:  In process, will continue to follow Medicare Important Message given?   (If response is "NO", the following Medicare IM given date fields will be blank) Date Medicare IM given:   Date Additional Medicare IM given:    Discharge Disposition:    Per UR Regulation:  Reviewed for med. necessity/level of care/duration of stay  If discussed at Long Length of Stay Meetings, dates discussed:   11/23/2011    Comments:  11/23/11 Aviva Wolfer RN,BSN NCM [phone] EGD/STENT CANCELLED TODAY.SX FOLLOWING.

## 2011-11-24 ENCOUNTER — Inpatient Hospital Stay (HOSPITAL_COMMUNITY): Payer: Medicare Other

## 2011-11-24 ENCOUNTER — Encounter (HOSPITAL_COMMUNITY)
Admission: EM | Disposition: A | Discharge status: Skilled Nursing Facility | Payer: Self-pay | Source: Home / Self Care | Attending: Internal Medicine

## 2011-11-24 ENCOUNTER — Encounter (HOSPITAL_COMMUNITY): Payer: Self-pay | Admitting: Gastroenterology

## 2011-11-24 HISTORY — PX: ESOPHAGOGASTRODUODENOSCOPY: SHX5428

## 2011-11-24 HISTORY — PX: DUODENAL STENT PLACEMENT: SHX5541

## 2011-11-24 LAB — BASIC METABOLIC PANEL
BUN: 15 mg/dL (ref 6–23)
Chloride: 103 mEq/L (ref 96–112)
GFR calc Af Amer: 89 mL/min — ABNORMAL LOW (ref >90)
Potassium: 2.7 mEq/L — CL (ref 3.5–5.1)
Sodium: 139 mEq/L (ref 135–145)

## 2011-11-24 LAB — CBC
HCT: 36.9 % (ref 36.0–46.0)
Hemoglobin: 11.5 g/dL — ABNORMAL LOW (ref 12.0–15.0)
MCHC: 31.2 g/dL (ref 30.0–36.0)
RBC: 4.2 MIL/uL (ref 3.87–5.11)
WBC: 6.2 10*3/uL (ref 4.0–10.5)

## 2011-11-24 LAB — POTASSIUM: Potassium: 3 mEq/L — ABNORMAL LOW (ref 3.5–5.1)

## 2011-11-24 SURGERY — EGD (ESOPHAGOGASTRODUODENOSCOPY)
Anesthesia: Moderate Sedation

## 2011-11-24 MED ORDER — POTASSIUM CHLORIDE 10 MEQ/100ML IV SOLN
10.0000 meq | INTRAVENOUS | Status: AC
  Administered 2011-11-24 (×3): 10 meq via INTRAVENOUS
  Filled 2011-11-24 (×4): qty 100

## 2011-11-24 MED ORDER — LEVOFLOXACIN IN D5W 750 MG/150ML IV SOLN
750.0000 mg | INTRAVENOUS | Status: DC
  Administered 2011-11-26 – 2011-11-30 (×3): 750 mg via INTRAVENOUS
  Filled 2011-11-24 (×4): qty 150

## 2011-11-24 MED ORDER — GLYCOPYRROLATE 0.2 MG/ML IJ SOLN
INTRAMUSCULAR | Status: AC
  Filled 2011-11-24: qty 1

## 2011-11-24 MED ORDER — DIPHENHYDRAMINE HCL 50 MG/ML IJ SOLN
INTRAMUSCULAR | Status: AC
  Filled 2011-11-24: qty 1

## 2011-11-24 MED ORDER — FENTANYL CITRATE 0.05 MG/ML IJ SOLN
INTRAMUSCULAR | Status: AC
  Filled 2011-11-24: qty 4

## 2011-11-24 MED ORDER — MIDAZOLAM HCL 10 MG/2ML IJ SOLN
INTRAMUSCULAR | Status: DC | PRN
  Administered 2011-11-24 (×2): 2 mg via INTRAVENOUS

## 2011-11-24 MED ORDER — POTASSIUM CHLORIDE 10 MEQ/100ML IV SOLN
10.0000 meq | INTRAVENOUS | Status: AC
  Administered 2011-11-24 (×4): 10 meq via INTRAVENOUS
  Filled 2011-11-24 (×4): qty 100

## 2011-11-24 MED ORDER — MIDAZOLAM HCL 10 MG/2ML IJ SOLN
INTRAMUSCULAR | Status: AC
  Filled 2011-11-24: qty 4

## 2011-11-24 MED ORDER — FENTANYL NICU IV SYRINGE 50 MCG/ML
INJECTION | INTRAMUSCULAR | Status: DC | PRN
  Administered 2011-11-24 (×2): 25 ug via INTRAVENOUS

## 2011-11-24 MED ORDER — LORAZEPAM 2 MG/ML IJ SOLN
1.0000 mg | Freq: Once | INTRAMUSCULAR | Status: AC
  Administered 2011-11-24: 1 mg via INTRAVENOUS
  Filled 2011-11-24: qty 1

## 2011-11-24 NOTE — Op Note (Signed)
[facility] [address] [place], [place]  [address]  ENDOSCOPY PROCEDURE REPORT  PATIENT:  [name], [name]  MR#:  [id number] BIRTHDATE:  31-Jan-1929, 82 yrs. old  GENDER:  female  ENDOSCOPIST:  Barbette Hair. Arlyce Dice, MD Referred by:  Stacie Glaze, M.D.  PROCEDURE DATE:  11/24/2011 PROCEDURE:  EGD w/ stent placement ASA CLASS:  Class III INDICATIONS:  other Possible gastric outlet obstruction  MEDICATIONS:   These medications were titrated to patient response per physician's verbal order, Fentanyl 50 mcg IV, Versed 4 mg IV TOPICAL ANESTHETIC:  Cetacaine Spray  DESCRIPTION OF PROCEDURE:   After the risks and benefits of the procedure were explained, informed consent was obtained.  The Pentax Gastroscope [id number] endoscope was introduced through the mouth and advanced to the third portion of the duodenum.  The instrument was slowly withdrawn as the mucosa was fully examined.  A stricture was found at the gastroesophageal junction. Early stricture at the GE junction as previously described Post-operative change was noted. Foreshortened esophagus with GE junction at approximately 22 cm from the incisors.  pyloric stenosis. beginning in the area the pylorus the first portion the duodenum is convoluted and may have some external compression. 9.5 mm scope easily passes through this area but requires multiple twists and turns.  The third portion of the duodenum was marked with next oh clip the area just excellent the pylorus was also marked with a clip. The area was traversed with a 0.35 mm guidewire and the scope was pulled out.  Over the guidewire and under fluoroscopic guidance a 23 x 155 mm partially covered esophageal wall stent was placed just proximal to the pylorus into the third portion of the duodenum. Retroflexed views revealed not done.    The scope was then withdrawn from the patient and the procedure completed.  COMPLICATIONS:  None  ENDOSCOPIC  IMPRESSION:1) possible stenosis at level of pylorus and first portion of the duodenum-status post basement of partially covered esophageal wall stent 2) early stricture at the GE junction  RECOMMENDATIONS: Upper GI series  ______________________________ Barbette Hair. Arlyce Dice, MD  CC:  n. eSIGNED:   Barbette Hair.  at 11/24/2011 02:43 PM  Candie Chroman, [phone]

## 2011-11-24 NOTE — Progress Notes (Signed)
Subjective: Denies pain/dicomfort. Does report "sore throat". Frail appearing but NAD  Objective: Vital signs Filed Vitals:   11/23/11 2020 11/23/11 2146 11/23/11 2213 11/24/11 0428  BP:  96/57 112/76 118/77  Pulse:  98 106 104  Temp:  97.6 F (36.4 C) 98.3 F (36.8 C) 98.6 F (37 C)  TempSrc:  Oral Oral Oral  Resp:  18 18 16   Height:      Weight:    55.475 kg (122 lb 4.8 oz)  SpO2: 91% 97% 97% 97%   Weight change: 0.59 kg (1 lb 4.8 oz) Last BM Date: 11/23/11  Intake/Output from previous day: 04/24 0701 - 04/25 0700 In: 2631.3 [P.O.:360; I.V.:1821.3; IV Piggyback:450] Out: 600 [Urine:600]     Physical Exam: General: Alert, awake, oriented x3, in no acute distress. HEENT: No bruits, no goiter. Mucus membranes mouth slightly dry/pale.  Heart: Irregularly irregular rythm without murmurs, rubs, gallops. Lungs:Normal effort but slightly shallow. Breath sounds clear to auscultation bilaterally. No wheeze Abdomen: Soft, nontender, nondistended, positive bowel sounds. Extremities: No clubbing cyanosis or edema with positive pedal pulses. Neuro: Grossly intact, nonfocal. Speech soft but clear   Lab Results: Basic Metabolic Panel:  Basename 11/24/11 0512 11/22/11 0417  NA 139 136  K 2.7* 3.5  CL 103 100  CO2 31 29  GLUCOSE 94 179*  BUN 15 11  CREATININE 0.79 0.72  CALCIUM 8.3* 8.5  MG -- --  PHOS -- --   Liver Function Tests: No results found for this basename: AST:2,ALT:2,ALKPHOS:2,BILITOT:2,PROT:2,ALBUMIN:2 in the last 72 hours No results found for this basename: LIPASE:2,AMYLASE:2 in the last 72 hours No results found for this basename: AMMONIA:2 in the last 72 hours CBC:  Basename 11/24/11 0512  WBC 6.2  NEUTROABS --  HGB 11.5*  HCT 36.9  MCV 87.9  PLT 153   Cardiac Enzymes: No results found for this basename: CKTOTAL:3,CKMB:3,CKMBINDEX:3,TROPONINI:3 in the last 72 hours BNP: No results found for this basename: PROBNP:3 in the last 72  hours D-Dimer: No results found for this basename: DDIMER:2 in the last 72 hours CBG:  Basename 11/24/11 0751 11/23/11 0736  GLUCAP 89 93   Hemoglobin A1C: No results found for this basename: HGBA1C in the last 72 hours Fasting Lipid Panel: No results found for this basename: CHOL,HDL,LDLCALC,TRIG,CHOLHDL,LDLDIRECT in the last 72 hours Thyroid Function Tests: No results found for this basename: TSH,T4TOTAL,FREET4,T3FREE,THYROIDAB in the last 72 hours Anemia Panel: No results found for this basename: VITAMINB12,FOLATE,FERRITIN,TIBC,IRON,RETICCTPCT in the last 72 hours Coagulation: No results found for this basename: LABPROT:2,INR:2 in the last 72 hours Urine Drug Screen: Drugs of Abuse  No results found for this basename: labopia, cocainscrnur, labbenz, amphetmu, thcu, labbarb    Alcohol Level: No results found for this basename: ETH:2 in the last 72 hours Urinalysis: No results found for this basename: COLORURINE:2,APPERANCEUR:2,LABSPEC:2,PHURINE:2,GLUCOSEU:2,HGBUR:2,BILIRUBINUR:2,KETONESUR:2,PROTEINUR:2,UROBILINOGEN:2,NITRITE:2,LEUKOCYTESUR:2 in the last 72 hours Misc. Labs:  No results found for this or any previous visit (from the past 240 hour(s)).  Studies/Results: No results found.  Medications: Scheduled Meds:   . antiseptic oral rinse  15 mL Mouth Rinse BID  . bisacodyl  10 mg Rectal Once  . bisoprolol-hydrochlorothiazide  1 tablet Oral Daily  . calcium-vitamin D  1 tablet Oral TID WC  . ceFEPime (MAXIPIME) IV  1 g Intravenous Q12H  . digoxin  0.125 mg Intravenous Daily  . digoxin  0.25 mg Intravenous Q2H  . docusate sodium  100 mg Oral BID  . Hemocyte Plus  106 mg Oral BID  . levofloxacin (LEVAQUIN) IV  750 mg Intravenous Q24H  . loratadine  5 mg Oral QPM  . LORazepam  1 mg Intravenous Once  . magic mouthwash w/lidocaine  5 mL Oral TID  . metoCLOPramide  5 mg Oral TID AC  . potassium chloride  10 mEq Intravenous Q1 Hr x 4  . sodium phosphate  1 enema Rectal  Once  . sucralfate  1 g Oral BID AC  . vancomycin  500 mg Intravenous Q8H  . Vitamin D (Ergocalciferol)  50,000 Units Oral Q7 days  . DISCONTD: vancomycin  500 mg Intravenous Q12H   Continuous Infusions:   . sodium chloride 75 mL/hr at 11/24/11 0651   PRN Meds:.acetaminophen, acetaminophen, albuterol, albuterol, bisacodyl, morphine injection, ondansetron, phenol, promethazine, zolpidem  Assessment/Plan:  Principal Problem:  *Vomiting Active Problems:  ANEMIA-IRON DEFICIENCY  HYPERTENSION  GERD  HX OF ESOPHAGEAL CANCER  Asthma with bronchitis  Esophageal stricture  Gastric outlet obstruction  Hypokalemia  Atrial fibrillation with rapid ventricular response  Constipation  Severe malnutrition   Vomiting secondary to multifactorial causes including: Esophageal stricture and Gastric outlet obstruction in a patient with a history of esophageal cancer status post surgical resection, gastroparesis, volvulus.  Status post EGD 11/24/11 with duodenal stent placement. Gastroenterology consultation performed by Dr. Marina Goodell on 11/17/2011.  Status post EGD 10/19/2011 showed her anastomosis to be at 17 cm with stenosis at the anastomosis, which was dilated.  EGD at that time also showed gastritis with biopsies negative for H. Pylori.  Hospitalized 11/05/2011-11/10/2011 for what was thought to be a gastroparesis flare. An upper GI series was suspicious for delayed gastric emptying and an EGD failed to reveal any evidence of gastric outlet obstruction. She responded to NG tube decompression.  Zofran and Phenergan ordered on admission for symptomatic treatment.  On Reglan chronically to address gastroparesis.  Dr. Gerrit Friends consulted on 11/18/2011 per Dr. Lamar Sprinkles recommendations and no surgical intervention recommended.  Further evaluation done by Dr. Tyrone Sage on 11/21/2011 with recommendations for consideration of balloon dilatation of the pyloroplasty versus placement of a jejunostomy tube with enteral tube  feedings.  Diet advanced to regular on 11/19/2011, but could not tolerate this for a long.  NG tube for gastric decompression ordered by the gastroenterologists on 11/21/2011.  EGD performed on 11/22/2011 which showed a very tortuous and collapsed pylorus and proximal small bowel. Functional obstruction was noted. Duodenal stent encompassing the pylorus and her proximal small bowel recommended and delayed 4/24 secondary to HR. Re-scheduled for today as HR controlled.   Aspiration pneumonia  The patient was found to be hypoxic on 11/22/2011.  A chest x-ray done on 11/22/2011 showed perihilar infiltrates concerning for pulmonary edema.  Patient was put on broad-spectrum antibiotics as noted below, for treatment of presumed aspiration pneumonia. Cefepime and levaquin day #3. ANEMIA-IRON DEFICIENCY  Hemoglobin 11.8-14.1, stable. HYPERTENSION  Blood pressure 96-118.  Continue bisoprolol/HCTZ. GERD  Continue Prevacid. HX OF ESOPHAGEAL CANCER  No evidence of recurrence. Asthma with bronchitis  Stable. albuterol HFA prn Atrial fibrillation with rapid ventricular response  Nursing staff reported an irregular and rapid heart rate on 11/23/2011.  12-lead EKG confirms atrial fibrillation with rapid ventricular response, ventricular rate 154 beats per minute  Rate controlled. Will continue dig. Monitor Constipation  Seen on abdominal films 4/18-4/19.  Given a Dulcolax suppository on 11/18/2011.  Started on [facility] 11/18/2011.  Fleets enema 11/23/2011 with fair results. Hypokalemia  Secondary to GI losses.  Replaced today with 4 runs IV. Will recheck in am.  Severe malnutrition  Related to  intractable nausea and vomiting.  Seen by dietitian on 11/18/2011.  BMI 20.69.  If unable to tolerate orally will need TNA. Hypoxia  Low oxygen levels noted after NG tube placement on 11/22/2011.  Placed on supplemental oxygen. Sats ranging 91-97 on 2L [place].       LOS: 7 days   [facility] M 11/24/2011, 8:55  AM  Patient seen and examined. Agree with above note. Patient continues to be stable. Will see how she does with liquid diet. If she doesn't tolerate will require TNA.  [name] 4:34 PM

## 2011-11-24 NOTE — Progress Notes (Signed)
A duodenal stent was placed across the possible area of stenosis where the stomach empties into the duodenum.  Plan upper GI series in a.m. to determine if stomach contents empty his into the duodenum. In the interim I will place her on a liquid diet

## 2011-11-24 NOTE — Progress Notes (Signed)
Patient ID: [name], female   DOB: 1929-06-27, 78 y.o.   MRN: [id number] [facility] Progress Note  Subjective: Weak, SOB  Mild, no change from yesterday, some cough. No vomiting as long as she doesn't eat. Took a little jello yesterday.  Objective:  Vital signs in last 24 hours: Temp:  [97.6 F (36.4 C)-99.1 F (37.3 C)] 98.6 F (37 C) (04/25 0428) Pulse Rate:  [98-125] 104  (04/25 0428) Resp:  [16-18] 16  (04/25 0428) BP: (96-118)/(57-77) 118/77 mmHg (04/25 0428) SpO2:  [91 %-97 %] 97 % (04/25 0428) Weight:  [122 lb 4.8 oz (55.475 kg)] 122 lb 4.8 oz (55.475 kg) (04/25 0428) Last BM Date: 11/23/11 General:   Alert,  Well-developed,    in NAD Heart: irr Regular rate and rhythm; no murmurs rate 120's Pulm;decreased BS bases Abdomen:  Soft, nontender and nondistended. Normal bowel sounds, without guarding, and without rebound.   Extremities:  Without edema. Neurologic:  Alert and  oriented x4;  grossly normal neurologically. Psych:  Alert and cooperative. Normal mood and affect.  Intake/Output from previous day: 04/24 0701 - 04/25 0700 In: 2631.3 [P.O.:360; I.V.:1821.3; IV Piggyback:450] Out: 600 [Urine:600] Intake/Output this shift:    Lab Results:  Basename 11/24/11 0512  WBC 6.2  HGB 11.5*  HCT 36.9  PLT 153   BMET  Basename 11/24/11 0512 11/22/11 0417  NA 139 136  K 2.7* 3.5  CL 103 100  CO2 31 29  GLUCOSE 94 179*  BUN 15 11  CREATININE 0.79 0.72  CALCIUM 8.3* 8.5     Assessment / Plan: 78 yo female with gastric outlet type sxs post esophagectomy and gastric pull-up 12 years ago. Tortuous pylorus- pt is for EGD with stent placement in pylorus today. #2  Atrial fib-new with RVR- improved since yesterday rate 110-120 #3 hypokalemia- replacing IV this am #4 ? Aspiration pneumonia- on maxipime/levaquin #5 nutrition- she will need TNA if unable to take po's in next 24 hours Principal Problem:  *Vomiting Active Problems:  ANEMIA-IRON  DEFICIENCY  HYPERTENSION  GERD  HX OF ESOPHAGEAL CANCER  Asthma with bronchitis  Esophageal stricture  Gastric outlet obstruction  Hypokalemia  Atrial fibrillation with rapid ventricular response  Constipation  Severe malnutrition     LOS: 7 days   Akari Crysler  11/24/2011, 8:55 AM

## 2011-11-24 NOTE — Progress Notes (Signed)
CRITICAL VALUE ALERT  Critical value received:  K+ 2.7  Date of notification:  11/24/11  Time of notification:    Critical value read back:yes  Nurse who received alert:  Audria Nine, RN  MD notified (1st page): Kirtland Bouchard. Schorr  Time of first page:  06:20  MD notified (2nd page):  Time of second page:  Responding MD:  Merdis Delay  Time MD responded:  06:25

## 2011-11-25 ENCOUNTER — Inpatient Hospital Stay (HOSPITAL_COMMUNITY): Payer: Medicare Other

## 2011-11-25 ENCOUNTER — Encounter (HOSPITAL_COMMUNITY): Payer: Self-pay | Admitting: Gastroenterology

## 2011-11-25 LAB — BASIC METABOLIC PANEL
BUN: 7 mg/dL (ref 6–23)
Chloride: 101 mEq/L (ref 96–112)
Creatinine, Ser: 0.66 mg/dL (ref 0.50–1.10)
GFR calc Af Amer: >90 mL/min (ref >90)
Glucose, Bld: 93 mg/dL (ref 70–99)
Potassium: 3.3 mEq/L — ABNORMAL LOW (ref 3.5–5.1)

## 2011-11-25 LAB — CBC
HCT: 38.8 % (ref 36.0–46.0)
Hemoglobin: 12.4 g/dL (ref 12.0–15.0)
MCHC: 32 g/dL (ref 30.0–36.0)
MCV: 88 fL (ref 78.0–100.0)
RDW: 14.4 % (ref 11.5–15.5)
WBC: 5.6 10*3/uL (ref 4.0–10.5)

## 2011-11-25 LAB — GLUCOSE, CAPILLARY: Glucose-Capillary: 99 mg/dL (ref 70–99)

## 2011-11-25 MED ORDER — IOHEXOL 300 MG/ML  SOLN
50.0000 mL | Freq: Once | INTRAMUSCULAR | Status: AC | PRN
  Administered 2011-11-25: 50 mL via ORAL

## 2011-11-25 MED ORDER — LORAZEPAM 2 MG/ML IJ SOLN
0.5000 mg | Freq: Every evening | INTRAMUSCULAR | Status: DC | PRN
  Administered 2011-11-25 – 2011-11-29 (×5): 0.5 mg via INTRAVENOUS
  Filled 2011-11-25 (×5): qty 1

## 2011-11-25 MED ORDER — POTASSIUM PHOSPHATE DIBASIC 3 MMOLE/ML IV SOLN
10.0000 mmol | Freq: Once | INTRAVENOUS | Status: AC
  Administered 2011-11-25: 10 mmol via INTRAVENOUS
  Filled 2011-11-25 (×2): qty 3.33

## 2011-11-25 MED ORDER — LORAZEPAM 0.5 MG PO TABS: 0.5000 mg | ORAL_TABLET | Freq: Every evening | ORAL | Status: DC | PRN

## 2011-11-25 MED ORDER — MAGNESIUM SULFATE IN D5W 10-5 MG/ML-% IV SOLN
1.0000 g | Freq: Once | INTRAVENOUS | Status: AC
  Administered 2011-11-25: 1 g via INTRAVENOUS
  Filled 2011-11-25: qty 100

## 2011-11-25 MED ORDER — POTASSIUM CHLORIDE 10 MEQ/100ML IV SOLN
10.0000 meq | INTRAVENOUS | Status: AC
  Administered 2011-11-25 (×4): 10 meq via INTRAVENOUS
  Filled 2011-11-25 (×4): qty 100

## 2011-11-25 NOTE — Progress Notes (Signed)
ANTIBIOTIC CONSULT NOTE - FOLLOW UP  Pharmacy Consult for Vanco Indication: pneumonia  Allergies  Allergen Reactions  . Erythromycin Ethylsuccinate Other (See Comments)    REACTION: unspecified  . Prednisone Other (See Comments)    REACTION: unspecified    Patient Measurements: Height: 5\' 3"  (160 cm) Weight: 124 lb 5.4 oz (56.4 kg) IBW/kg (Calculated) : 52.4   Vital Signs: Temp: 98.2 F (36.8 C) (04/26 1348) Temp src: Oral (04/26 1348) BP: 123/89 mmHg (04/26 1348) Pulse Rate: 95  (04/26 1348) Intake/Output from previous day: 04/25 0701 - 04/26 0700 In: 1530 [P.O.:60; I.V.:1170; IV Piggyback:300] Out: 1950 [Urine:1950] Intake/Output from this shift: Total I/O In: 120 [P.O.:120] Out: 1300 [Urine:1300]  Labs:  [facility] 11/25/11 0441 11/24/11 0512  WBC 5.6 6.2  HGB 12.4 11.5*  PLT 160 153  LABCREA -- --  CREATININE 0.66 0.79   Estimated Creatinine Clearance: 46.4 ml/min (by C-G formula based on Cr of 0.66).  Basename 11/25/11 1652 11/23/11 1721  VANCOTROUGH 21.3* 8.8*  VANCOPEAK -- --  Drue Dun -- --  GENTTROUGH -- --  GENTPEAK -- --  GENTRANDOM -- --  TOBRATROUGH -- --  TOBRAPEAK -- --  TOBRARND -- --  AMIKACINPEAK -- --  AMIKACINTROU -- --  AMIKACIN -- --     Microbiology: No results found for this or any previous visit (from the past 720 hour(s)).  Anti-infectives     Start     Dose/Rate Route Frequency Ordered Stop   11/26/11 0800   levofloxacin (LEVAQUIN) IVPB 750 mg        750 mg 100 mL/hr over 90 Minutes Intravenous Every 48 hours 11/24/11 0917 12/02/11 0759   11/24/11 0200   vancomycin (VANCOCIN) 500 mg in sodium chloride 0.9 % 100 mL IVPB        500 mg 100 mL/hr over 60 Minutes Intravenous Every 8 hours 11/23/11 2234     11/22/11 0600   levofloxacin (LEVAQUIN) IVPB 750 mg  Status:  Discontinued        750 mg 100 mL/hr over 90 Minutes Intravenous Every 24 hours 11/22/11 0600 11/24/11 0917   11/22/11 0600   ceFEPIme (MAXIPIME) 1 g in  dextrose 5 % 50 mL IVPB        1 g 100 mL/hr over 30 Minutes Intravenous Every 12 hours 11/22/11 0600     11/22/11 0600   vancomycin (VANCOCIN) 500 mg in sodium chloride 0.9 % 100 mL IVPB  Status:  Discontinued        500 mg 100 mL/hr over 60 Minutes Intravenous Every 12 hours 11/22/11 0600 11/23/11 2234          Assessment: 78 yo F on Day # 4 Vanco, Cefepime, Levaquin for presumed aspiration PNA (started 4/23). Pt was previously on Vanco 500mg  IV q12h, but had subtherapeutic trough on this regimen, therefore vanco was increased to 500mg  IV q 8 hr. "Trough" drawn at 16:52 today = 21.3, however, the Vanco dose that was due at 10am was not hung until 14:00, thus this is not a true Vanco trough nor a true assessment of the new q8h regimen.  Goal of Therapy:  Vancomycin trough level 15-20 mcg/ml  Plan:  1) Will reschedule future Vanco doses 2) Will have to reorder a new Vanco trough once new schedule is established.  Darrol Angel, PharmD Pager: [phone] 11/25/2011,5:47 PM

## 2011-11-25 NOTE — Progress Notes (Signed)
Nutrition Follow-up  Diet Order:  Diet increased to a dysphagia 3 diet today.  Tolerating solid diet well.  Eating small amounts.  Reports early satiety.  Meds: Scheduled Meds:   . antiseptic oral rinse  15 mL Mouth Rinse BID  . bisacodyl  10 mg Rectal Once  . bisoprolol-hydrochlorothiazide  1 tablet Oral Daily  . calcium-vitamin D  1 tablet Oral TID WC  . ceFEPime (MAXIPIME) IV  1 g Intravenous Q12H  . digoxin  0.125 mg Intravenous Daily  . docusate sodium  100 mg Oral BID  . Hemocyte Plus  106 mg Oral BID  . levofloxacin (LEVAQUIN) IV  750 mg Intravenous Q48H  . loratadine  5 mg Oral QPM  . LORazepam  1 mg Intravenous Once  . magic mouthwash w/lidocaine  5 mL Oral TID  . magnesium sulfate 1 - 4 g bolus IVPB  1 g Intravenous Once  . metoCLOPramide  5 mg Oral TID AC  . potassium chloride  10 mEq Intravenous Q1 Hr x 4  . potassium chloride  10 mEq Intravenous Q1 Hr x 4  . potassium phosphate IVPB (mmol)  10 mmol Intravenous Once  . sodium phosphate  1 enema Rectal Once  . sucralfate  1 g Oral BID AC  . vancomycin  500 mg Intravenous Q8H  . Vitamin D (Ergocalciferol)  50,000 Units Oral Q7 days   Continuous Infusions:   . sodium chloride 50 mL/hr at 11/25/11 0823   PRN Meds:.acetaminophen, acetaminophen, albuterol, albuterol, bisacodyl, iohexol, LORazepam, morphine injection, ondansetron, phenol, promethazine, DISCONTD: LORazepam, DISCONTD: zolpidem  Labs:  CMP     Component Value Date/Time   NA 137 11/25/2011 0441   K 3.3* 11/25/2011 0441   CL 101 11/25/2011 0441   CO2 30 11/25/2011 0441   GLUCOSE 93 11/25/2011 0441   BUN 7 11/25/2011 0441   CREATININE 0.66 11/25/2011 0441   CALCIUM 9.0 11/25/2011 0441   PROT 5.0* 11/18/2011 0340   ALBUMIN 2.9* 11/18/2011 0340   AST 10 11/18/2011 0340   ALT 6 11/18/2011 0340   ALKPHOS 49 11/18/2011 0340   BILITOT 0.5 11/18/2011 0340   GFRNONAA 81* 11/25/2011 0441   GFRAA >90 11/25/2011 0441     Intake/Output Summary (Last 24 hours) at 11/25/11  1746 Last data filed at 11/25/11 1500  Gross per 24 hour  Intake   1200 ml  Output   2600 ml  Net  -1400 ml    Weight Status: 116.7#, weight increased from 1 week ago  Wt Readings from Last 3 Encounters:  11/25/11 124 lb 5.4 oz (56.4 kg)  11/25/11 124 lb 5.4 oz (56.4 kg)  11/25/11 124 lb 5.4 oz (56.4 kg)     Re-estimated needs:  (unchanged) 1600-1850 kcal, 65-80 g pro, >1.5L fluid  Nutrition Dx:  Inadequate oral intake--progressing, Severe PCM ongoing  Goal:  1.  Pt to have no further N/V--met  2.  Pt to consume >75% of meals--progressing  Intervention:   Continue current diet, provide pt preferences Add snack (yogurt) every afternoon per pt preference Dislikes supplements Instructed to continue gradual increase of intake.   Monitor:  PO intake, weight trend   Derrell Lolling Anastasia Fiedler Pager #:  [phone]

## 2011-11-25 NOTE — Progress Notes (Signed)
UGI demonstrates adequate gastric emptying through the stent.  She's tolerating a solid food diet.  Will keep stent in place as long as it remains.

## 2011-11-25 NOTE — Progress Notes (Signed)
Subjective: Awake, alert, denies pain/nause/vomiting. Tolerating clear liquids.   Objective: Vital signs Filed Vitals:   11/24/11 2202 11/25/11 0426 11/25/11 0500 11/25/11 0611  BP: 138/100   109/78  Pulse: 93   105  Temp: 98.6 F (37 C)   98.3 F (36.8 C)  TempSrc: Oral   Oral  Resp: 20   20  Height:      Weight:  56.4 kg (124 lb 5.4 oz) 56.4 kg (124 lb 5.4 oz)   SpO2: 98%   92%   Weight change: 0.925 kg (2 lb 0.6 oz) Last BM Date: 11/23/11  Intake/Output from previous day: 04/25 0701 - 04/26 0700 In: 1530 [P.O.:60; I.V.:1170; IV Piggyback:300] Out: 1950 [Urine:1950]     Physical Exam: General: Alert, awake, oriented x3, in no acute distress. HEENT: No bruits, no goiter. Heart: Irregularly irregular rythm, without murmurs, rubs, gallops. Lungs:Normal effort. Breath sounds distant but clear to auscultation bilaterally. No wheeze. Dry cough during exam Abdomen: Soft, nontender, nondistended, positive bowel sounds. Extremities: No clubbing cyanosis or edema with positive pedal pulses. Neuro: Grossly intact, nonfocal. Speech clear. Voice stronger today    Lab Results: Basic Metabolic Panel:  Basename 11/25/11 0441 11/24/11 1300 11/24/11 0512  NA 137 -- 139  K 3.3* 3.0* --  CL 101 -- 103  CO2 30 -- 31  GLUCOSE 93 -- 94  BUN 7 -- 15  CREATININE 0.66 -- 0.79  CALCIUM 9.0 -- 8.3*  MG 1.5 -- --  PHOS 1.8* -- --   Liver Function Tests: No results found for this basename: AST:2,ALT:2,ALKPHOS:2,BILITOT:2,PROT:2,ALBUMIN:2 in the last 72 hours No results found for this basename: LIPASE:2,AMYLASE:2 in the last 72 hours No results found for this basename: AMMONIA:2 in the last 72 hours CBC:  Basename 11/25/11 0441 11/24/11 0512  WBC 5.6 6.2  NEUTROABS -- --  HGB 12.4 11.5*  HCT 38.8 36.9  MCV 88.0 87.9  PLT 160 153   Cardiac Enzymes: No results found for this basename: CKTOTAL:3,CKMB:3,CKMBINDEX:3,TROPONINI:3 in the last 72 hours BNP: No results found for this  basename: PROBNP:3 in the last 72 hours D-Dimer: No results found for this basename: DDIMER:2 in the last 72 hours CBG:  Basename 11/25/11 0726 11/24/11 0751 11/23/11 0736  GLUCAP 99 89 93   Hemoglobin A1C: No results found for this basename: HGBA1C in the last 72 hours Fasting Lipid Panel: No results found for this basename: CHOL,HDL,LDLCALC,TRIG,CHOLHDL,LDLDIRECT in the last 72 hours Thyroid Function Tests: No results found for this basename: TSH,T4TOTAL,FREET4,T3FREE,THYROIDAB in the last 72 hours Anemia Panel: No results found for this basename: VITAMINB12,FOLATE,FERRITIN,TIBC,IRON,RETICCTPCT in the last 72 hours Coagulation: No results found for this basename: LABPROT:2,INR:2 in the last 72 hours Urine Drug Screen: Drugs of Abuse  No results found for this basename: labopia, cocainscrnur, labbenz, amphetmu, thcu, labbarb    Alcohol Level: No results found for this basename: ETH:2 in the last 72 hours Urinalysis: No results found for this basename: COLORURINE:2,APPERANCEUR:2,LABSPEC:2,PHURINE:2,GLUCOSEU:2,HGBUR:2,BILIRUBINUR:2,KETONESUR:2,PROTEINUR:2,UROBILINOGEN:2,NITRITE:2,LEUKOCYTESUR:2 in the last 72 hours Misc. Labs:  No results found for this or any previous visit (from the past 240 hour(s)).  Studies/Results: Dg Fluoro Rm 1-60 Min  11/24/2011  *RADIOLOGY REPORT*  Clinical Data: 78 year old female with history of previous esophagectomy and gastric pull-through.  Recent imaging findings suspicious for gastric outlet obstruction.  Duodenal stent placed.  FLOURO RM 1-60 MIN  Comparison: Chest CT 11/05/2011.  KUB 11/18/2011.  Findings: Single fluoroscopic spot image.  The mediastinal surgical clips on the patient's left indicate the stent traverses the level of the  diaphragm.  The metallic stent has been deployed and is vertically oriented extending from above the level of the diaphragm extending caudally to the epigastric region.  IMPRESSION: Vertically oriented the stent  deployed in the region of the duodenum in this patient with previous esophagectomy and gastric pull-through.  Original Report Authenticated By: Harley Hallmark, M.D.    Medications: Scheduled Meds:   . antiseptic oral rinse  15 mL Mouth Rinse BID  . bisacodyl  10 mg Rectal Once  . bisoprolol-hydrochlorothiazide  1 tablet Oral Daily  . calcium-vitamin D  1 tablet Oral TID WC  . ceFEPime (MAXIPIME) IV  1 g Intravenous Q12H  . digoxin  0.125 mg Intravenous Daily  . docusate sodium  100 mg Oral BID  . Hemocyte Plus  106 mg Oral BID  . levofloxacin (LEVAQUIN) IV  750 mg Intravenous Q48H  . loratadine  5 mg Oral QPM  . LORazepam  1 mg Intravenous Once  . magic mouthwash w/lidocaine  5 mL Oral TID  . magnesium sulfate 1 - 4 g bolus IVPB  1 g Intravenous Once  . metoCLOPramide  5 mg Oral TID AC  . potassium chloride  10 mEq Intravenous Q1 Hr x 4  . potassium chloride  10 mEq Intravenous Q1 Hr x 4  . potassium chloride  10 mEq Intravenous Q1 Hr x 4  . potassium phosphate IVPB (mmol)  10 mmol Intravenous Once  . sodium phosphate  1 enema Rectal Once  . sucralfate  1 g Oral BID AC  . vancomycin  500 mg Intravenous Q8H  . Vitamin D (Ergocalciferol)  50,000 Units Oral Q7 days   Continuous Infusions:   . sodium chloride 50 mL/hr at 11/25/11 0823   PRN Meds:.acetaminophen, acetaminophen, albuterol, albuterol, bisacodyl, morphine injection, ondansetron, phenol, promethazine, zolpidem, DISCONTD: fentaNYL, DISCONTD: midazolam  Assessment/Plan:  Principal Problem:  *Vomiting Active Problems:  ANEMIA-IRON DEFICIENCY  HYPERTENSION  GERD  HX OF ESOPHAGEAL CANCER  Asthma with bronchitis  Esophageal stricture  Gastric outlet obstruction  Hypokalemia  Atrial fibrillation with rapid ventricular response  Constipation  Severe malnutrition  Vomiting secondary to multifactorial causes including: Esophageal stricture and Gastric outlet obstruction in a patient with a history of esophageal cancer  status post surgical resection, gastroparesis, volvulus.  Status post EGD 11/24/11 with duodenal stent placement.  Gastroenterology consultation performed by Dr. Marina Goodell on 11/17/2011.  Status post EGD 10/19/2011 showed her anastomosis to be at 17 cm with stenosis at the anastomosis, which was dilated.  EGD at that time also showed gastritis with biopsies negative for H. Pylori.  Hospitalized 11/05/2011-11/10/2011 for what was thought to be a gastroparesis flare. An upper GI series was suspicious for delayed gastric emptying and an EGD failed to reveal any evidence of gastric outlet obstruction. She responded to NG tube decompression.  Zofran and Phenergan ordered on admission for symptomatic treatment.  On Reglan chronically to address gastroparesis.  Dr. Gerrit Friends consulted on 11/18/2011 per Dr. Lamar Sprinkles recommendations and no surgical intervention recommended.  Further evaluation done by Dr. Tyrone Sage on 11/21/2011 with recommendations for consideration of balloon dilatation of the pyloroplasty versus placement of a jejunostomy tube with enteral tube feedings.  Diet advanced to regular on 11/19/2011, but could not tolerate this for a long.  NG tube for gastric decompression ordered by the gastroenterologists on 11/21/2011.  EGD performed on 11/22/2011 which showed a very tortuous and collapsed pylorus and proximal small bowel. Functional obstruction was noted. Duodenal stent encompassing the pylorus and her proximal small  bowel recommended and delayed 4/24 secondary to HR. Re-scheduled for today as HR controlled S/P duodenal stent placement 11/24/11. Tolerating clear liquids this am. No pain/nausea/vomiting. Upper GI series pending. May be able to advance diet depending results. Defer to GI Aspiration pneumonia  The patient was found to be hypoxic on 11/22/2011.  A chest x-ray done on 11/22/2011 showed perihilar infiltrates concerning for pulmonary edema.  Patient was put on broad-spectrum antibiotics as noted below,  for treatment of presumed aspiration pneumonia. Cefepime and levaquin day #4. Sats 92-98 on 2L. Afebrile, non-toxic appearing ANEMIA-IRON DEFICIENCY  Hemoglobin 11.8-14.1, stable. HYPERTENSION  Blood pressure 109-128.  Continue bisoprolol/HCTZ. GERD  Continue Prevacid. HX OF ESOPHAGEAL CANCER  No evidence of recurrence. Asthma with bronchitis  Stable. albuterol HFA prn Atrial fibrillation with rapid ventricular response  Nursing staff reported an irregular and rapid heart rate on 11/23/2011.  12-lead EKG confirms atrial fibrillation with rapid ventricular response, ventricular rate 154 beats per minute  Rate controlled. HR range 93-105. Will continue dig. Monitor Constipation  Seen on abdominal films 4/18-4/19.  Given a Dulcolax suppository on 11/18/2011.  Started on [facility] 11/18/2011.  Fleets enema 11/23/2011 with fair results. Hypokalemia  Secondary to GI losses.  Replaced today with 4 runs IV. Goal for potassium 4.0 per pharmacy with dig on board.  Mg level 1.5. Will replete as well.  Will recheck in am.  Hypophosphoremia: phosphorus level 1.8. Will replete and recheck in am Severe malnutrition  Related to intractable nausea and vomiting.  Seen by dietitian on 11/18/2011.  BMI 20.69.  See #1. Tolerating clear liquids so far.  Hypoxia  Low oxygen levels noted after NG tube placement on 11/22/2011. Placed on supplemental oxygen. Sats ranging 93-105 on 2L. Wean as able.   LOS: 7 days    LOS: 8 days   [facility] M 11/25/2011, 9:25 AM

## 2011-11-25 NOTE — Progress Notes (Signed)
Patient ID: [name], female   DOB: 01-Jun-1929, 77 y.o.   MRN: [id number] [facility] Progress Note  Subjective: Looks good, and says she feels better. Able to tolerate clear liquids without difficulty so far.  Waiting for UGI  Objective:  Vital signs in last 24 hours: Temp:  [97.3 F (36.3 C)-98.6 F (37 C)] 98.3 F (36.8 C) (04/26 0611) Pulse Rate:  [93-105] 105  (04/26 0611) Resp:  [14-27] 20  (04/26 0611) BP: (109-158)/(75-102) 109/78 mmHg (04/26 0611) SpO2:  [88 %-100 %] 92 % (04/26 0611) Weight:  [124 lb 5.4 oz (56.4 kg)] 124 lb 5.4 oz (56.4 kg) (04/26 0500) Last BM Date: 11/23/11 General:   Alert,  Well-developed,    in NAD Heart: irr Regular rate and rhythm; no murmurs Pulm;clear ant Abdomen:  Soft, nontender and nondistended. Normal bowel sounds, without guarding, and without rebound.   Extremities:  Without edema. Neurologic:  Alert and  oriented x4;  grossly normal neurologically. Psych:  Alert and cooperative. Normal mood and affect.  Intake/Output from previous day: 04/25 0701 - 04/26 0700 In: 1530 [P.O.:60; I.V.:1170; IV Piggyback:300] Out: 1950 [Urine:1950] Intake/Output this shift:    Lab Results:  Basename 11/25/11 0441 11/24/11 0512  WBC 5.6 6.2  HGB 12.4 11.5*  HCT 38.8 36.9  PLT 160 153   BMET  Basename 11/25/11 0441 11/24/11 1300 11/24/11 0512  NA 137 -- 139  K 3.3* 3.0* 2.7*  CL 101 -- 103  CO2 30 -- 31  GLUCOSE 93 -- 94  BUN 7 -- 15  CREATININE 0.66 -- 0.79  CALCIUM 9.0 -- 8.3*     Assessment / Plan: #1 78 yo female stable s/p EGD with pyloric stent placement yesterday for functional outlet obstruction.  For UGI this am, then  Will decide if can advance to full liquids #2 Atrial fib- new rate stable #3 hx of esophageal CA- s/p esophagectomy and gastric pull-up #4 nutrition- hold on TNA until see how she can do with po's #5 hypokalemia-replacing #6 Possible aspiration pneumonia- on Maxipime/Levaquin Principal  Problem:  *Vomiting Active Problems:  ANEMIA-IRON DEFICIENCY  HYPERTENSION  GERD  HX OF ESOPHAGEAL CANCER  Asthma with bronchitis  Esophageal stricture  Gastric outlet obstruction  Hypokalemia  Atrial fibrillation with rapid ventricular response  Constipation  Severe malnutrition     LOS: 8 days   [name]  11/25/2011, 9:20 AM

## 2011-11-25 NOTE — Progress Notes (Signed)
Patient seen and examined. Agree with above note. Repeat CXR in AM. PT/OT. Await GI input.  Khushbu Pippen 1:10 PM

## 2011-11-26 ENCOUNTER — Inpatient Hospital Stay (HOSPITAL_COMMUNITY): Payer: Medicare Other

## 2011-11-26 LAB — BASIC METABOLIC PANEL
CO2: 31 mEq/L (ref 19–32)
Calcium: 9.2 mg/dL (ref 8.4–10.5)
GFR calc non Af Amer: 80 mL/min — ABNORMAL LOW (ref >90)
Potassium: 3.2 mEq/L — ABNORMAL LOW (ref 3.5–5.1)
Sodium: 137 mEq/L (ref 135–145)

## 2011-11-26 LAB — PHOSPHORUS: Phosphorus: 2 mg/dL — ABNORMAL LOW (ref 2.3–4.6)

## 2011-11-26 LAB — MAGNESIUM: Magnesium: 1.6 mg/dL (ref 1.5–2.5)

## 2011-11-26 LAB — GLUCOSE, CAPILLARY: Glucose-Capillary: 95 mg/dL (ref 70–99)

## 2011-11-26 MED ORDER — POTASSIUM CHLORIDE 10 MEQ/100ML IV SOLN
10.0000 meq | INTRAVENOUS | Status: AC
  Administered 2011-11-26 (×3): 10 meq via INTRAVENOUS
  Filled 2011-11-26 (×4): qty 100

## 2011-11-26 MED ORDER — POTASSIUM PHOSPHATE DIBASIC 3 MMOLE/ML IV SOLN
10.0000 mmol | Freq: Once | INTRAVENOUS | Status: AC
  Administered 2011-11-26: 10 mmol via INTRAVENOUS
  Filled 2011-11-26: qty 3.33

## 2011-11-26 MED ORDER — BISOPROLOL FUMARATE 5 MG PO TABS
2.5000 mg | ORAL_TABLET | Freq: Every day | ORAL | Status: DC
  Administered 2011-11-26: 2.5 mg via ORAL
  Administered 2011-11-27: 10:00:00 via ORAL
  Administered 2011-11-28 – 2011-11-30 (×3): 2.5 mg via ORAL
  Filled 2011-11-26 (×5): qty 0.5

## 2011-11-26 NOTE — Progress Notes (Signed)
Patient ID: [name], female   DOB: [birth year], 78 y.o.   MRN: [id number] [facility] Progress Note  Subjective: She is feeling stronger, wants to get up and walk and get back on her feet. Doing well with eating so far- has had soft diet and feels it is" going thru me fine", no vomiting or regurgitation  Objective:  Vital signs in last 24 hours: Temp:  [98.2 F (36.8 C)-98.6 F (37 C)] 98.6 F (37 C) (04/27 0545) Pulse Rate:  [71-95] 71  (04/27 0545) Resp:  [18-20] 20  (04/27 0545) BP: (120-133)/(78-89) 120/78 mmHg (04/27 0545) SpO2:  [92 %-94 %] 94 % (04/27 0545) Weight:  [124 lb 12.5 oz (56.6 kg)] 124 lb 12.5 oz (56.6 kg) (04/27 0545) Last BM Date: 11/25/11 General:   Alert,  Well-developed,    in NAD Heart: irr Regular rate and rhythm; no murmurs Pulm;clear Abdomen:  Soft, nontender and nondistended. Normal bowel sounds, without guarding, and without rebound.   Extremities:  Without edema. Neurologic:  Alert and  oriented x4;  grossly normal neurologically. Psych:  Alert and cooperative. Normal mood and affect.  Intake/Output from previous day: 04/26 0701 - 04/27 0700 In: 1976.3 [P.O.:240; I.V.:632.9; IV Piggyback:1103.3] Out: 2225 [Urine:2225] Intake/Output this shift:    Lab Results:  Basename 11/25/11 0441 11/24/11 0512  WBC 5.6 6.2  HGB 12.4 11.5*  HCT 38.8 36.9  PLT 160 153   BMET  Basename 11/26/11 0535 11/25/11 0441 11/24/11 1300 11/24/11 0512  NA 137 137 -- 139  K 3.2* 3.3* 3.0* --  CL 101 101 -- 103  CO2 31 30 -- 31  GLUCOSE 95 93 -- 94  BUN 4* 7 -- 15  CREATININE 0.70 0.66 -- 0.79  CALCIUM 9.2 9.0 -- 8.3*     Assessment / Plan: #1 78 yo female stable s/p pyloric stent placement on 4/25 for functional gastric outlet obstruction UGI shows emptying only in upright position Continue current diet Pt needs to be upright in a chair for all meals, and for at least one hour afterward Will ask PT to see to start ambulation  etc.   Principal Problem:  *Vomiting Active Problems:  ANEMIA-IRON DEFICIENCY  HYPERTENSION  GERD  HX OF ESOPHAGEAL CANCER  Asthma with bronchitis  Esophageal stricture  Gastric outlet obstruction  Hypokalemia  Atrial fibrillation with rapid ventricular response  Constipation  Severe malnutrition     LOS: 9 days   [name]  11/26/2011, 9:49 AM

## 2011-11-26 NOTE — Evaluation (Signed)
Occupational Therapy Evaluation Patient Details Name: [name] MRN: [id number] DOB: 12-22-1928 Today's Date: 11/26/2011 Time: 1191-4782 OT Time Calculation (min): 23 min  OT Assessment / Plan / Recommendation Clinical Impression  This 78 yo female s/p stent for gastro constricture presents to acute OT at a supervision level due to weakness which pt attributes to food running through her since they have started to let her eat food versus only liquids. Will benefit from acute OT to get back to an Independent level like she was when I saw her at [facility] a couple of weeks ago. If continues to remain weak may need to consider ST SNF since pt lives alone    OT Assessment  Patient needs continued OT Services    Follow Up Recommendations  Skilled nursing facility (if pt remains weak)    Equipment Recommendations  None recommended by OT    Frequency Min 2X/week    Precautions / Restrictions Precautions Precautions: None Restrictions Weight Bearing Restrictions: No       ADL  Eating/Feeding: Simulated;Independent Where Assessed - Eating/Feeding: Chair Grooming: Simulated;Supervision/safety Where Assessed - Grooming: Standing at sink Upper Body Bathing: Simulated;Supervision/safety Where Assessed - Upper Body Bathing: Unsupported;Sit to stand from chair;Sit to stand from bed Lower Body Bathing: Simulated;Supervision/safety Where Assessed - Lower Body Bathing: Unsupported;Sit to stand from chair;Sit to stand from bed Upper Body Dressing: Simulated;Supervision/safety Where Assessed - Upper Body Dressing: Sit to stand from bed;Sit to stand from chair;Unsupported Lower Body Dressing: Performed;Supervision/safety Where Assessed - Lower Body Dressing: Unsupported;Sit to stand from bed;Sit to stand from chair Toilet Transfer: Simulated;Supervision/safety (Bed--down hallway-- to recliner) Toilet Transfer Method: Ambulating Toileting - Clothing Manipulation:  Performed;Supervision/safety Where Assessed - Toileting Clothing Manipulation: Standing Toileting - Hygiene: Simulated;Independent Where Assessed - Toileting Hygiene: Sit to stand from 3-in-1 or toilet Tub/Shower Transfer Method: Not assessed Equipment Used:  (pushed IV pole) Ambulation Related to ADLs: Supervision pushing IV pole from one end of the unit 4 east to the other and back    OT Goals Acute Rehab OT Goals OT Goal Formulation: With patient Time For Goal Achievement: 12/10/11 Potential to Achieve Goals: Good ADL Goals Pt Will Perform Grooming: Independently;Unsupported (all tasks she would need to do at home) ADL Goal: Grooming - Progress: Goal set today Pt Will Perform Upper Body Bathing: Independently;Unsupported;Sit to stand from chair;Sit to stand from bed ADL Goal: Upper Body Bathing - Progress: Goal set today Pt Will Perform Lower Body Bathing: Independently;Unsupported;Sit to stand from bed;Sit to stand from chair ADL Goal: Lower Body Bathing - Progress: Goal set today Pt Will Perform Upper Body Dressing: Independently;Unsupported;Sit to stand from chair;Sit to stand from bed ADL Goal: Upper Body Dressing - Progress: Goal set today Pt Will Perform Lower Body Dressing: Independently;Sit to stand from chair;Sit to stand from bed;Unsupported ADL Goal: Lower Body Dressing - Progress: Goal set today Pt Will Transfer to Toilet: Independently;Ambulation;Regular height toilet ADL Goal: Toilet Transfer - Progress: Goal set today Pt Will Perform Toileting - Clothing Manipulation: Independently;Standing ADL Goal: Toileting - Clothing Manipulation - Progress: Goal set today Pt Will Perform Toileting - Hygiene: Independently;Sit to stand from 3-in-1/toilet ADL Goal: Toileting - Hygiene - Progress: Goal set today  Visit Information  Last OT Received On: 11/26/11 Assistance Needed: +1    Subjective Data  Subjective: I believe that if I could eat and it not run through me I could  build up some strength and be able to go home by myself Patient Stated Goal: To be able  to go home   Prior Functioning  Home Living Lives With: Alone Available Help at Discharge:  (None) Type of Home: House Home Access: Stairs to enter [employer] of Steps: 3-4 Entrance Stairs-Rails: Right Home Layout: One level Bathroom Shower/Tub: Health visitor: Handicapped height Bathroom Accessibility: Yes How Accessible: Accessible via walker Home Adaptive Equipment: Grab bars in shower;Built-in shower seat Prior Function Level of Independence: Independent Able to Take Stairs?: Yes Driving: Yes Vocation: Retired Musician: No difficulties Dominant Hand: Right    Cognition  Overall Cognitive Status: Appears within functional limits for tasks assessed/performed Arousal/Alertness: Awake/alert Orientation Level: Appears intact for tasks assessed Behavior During Session: [facility] for tasks performed    Extremity/Trunk Assessment Right Upper Extremity Assessment RUE ROM/Strength/Tone: Within functional levels Left Upper Extremity Assessment LUE ROM/Strength/Tone: Within functional levels   Mobility Bed Mobility Details for Bed Mobility Assistance: Up in chair upon arrival Transfers Transfers: Sit to Stand;Stand to Sit Sit to Stand: 5: Supervision;With upper extremity assist;With armrests;From chair/3-in-1 Stand to Sit: 5: Supervision;With upper extremity assist;With armrests;To chair/3-in-1   Exercise    Balance    End of Session OT - End of Session Equipment Utilized During Treatment:  (pushe IV pole) Activity Tolerance: Patient tolerated treatment well Patient left: in chair;with call bell/phone within reach Nurse Communication: Mobility status (+1 A and they will ambulate her again later)   Evette Georges [phone] 11/26/2011, 3:33 PM

## 2011-11-26 NOTE — Progress Notes (Signed)
Subjective: Awake, alert, denies pain/nause/vomiting. Tolerating diet. But is now having diarrhea. 3 episodes today so far. Loose, watery, large quantity.  Objective: Vital signs Filed Vitals:   11/25/11 0611 11/25/11 1348 11/25/11 2146 11/26/11 0545  BP: 109/78 123/89 133/78 120/78  Pulse: 105 95 78 71  Temp: 98.3 F (36.8 C) 98.2 F (36.8 C) 98.5 F (36.9 C) 98.6 F (37 C)  TempSrc: Oral Oral Oral Oral  Resp: 20 18 18 20   Height:      Weight:    56.6 kg (124 lb 12.5 oz)  SpO2: 92% 92% 92% 94%   Weight change: 0.2 kg (7.1 oz) Last BM Date: 11/25/11  Intake/Output from previous day: 04/26 0701 - 04/27 0700 In: 1976.3 [P.O.:240; I.V.:632.9; IV Piggyback:1103.3] Out: 2225 [Urine:2225] Total I/O In: 160 [P.O.:160] Out: 200 [Urine:200]   Physical Exam: General: Alert, awake, oriented x3, in no acute distress. HEENT: Normocephalic. Heart: Irregularly irregular rythm, without murmurs, rubs, gallops. Lungs:Normal effort. Breath sounds distant but clear to auscultation bilaterally. No wheeze.  Abdomen: Soft, nontender, nondistended, positive bowel sounds. Extremities: No clubbing cyanosis or edema with positive pedal pulses. Neuro: Grossly intact, nonfocal. Speech clear. Voice stronger today  Lab Results: Basic Metabolic Panel:  Basename 11/26/11 0535 11/25/11 0441  NA 137 137  K 3.2* 3.3*  CL 101 101  CO2 31 30  GLUCOSE 95 93  BUN 4* 7  CREATININE 0.70 0.66  CALCIUM 9.2 9.0  MG 1.6 1.5  PHOS 2.0* 1.8*   CBC:  Basename 11/25/11 0441 11/24/11 0512  WBC 5.6 6.2  NEUTROABS -- --  HGB 12.4 11.5*  HCT 38.8 36.9  MCV 88.0 87.9  PLT 160 153   Studies/Results: Dg Fluoro Rm 1-60 Min  11/24/2011  *RADIOLOGY REPORT*  Clinical Data: 78 year old female with history of previous esophagectomy and gastric pull-through.  Recent imaging findings suspicious for gastric outlet obstruction.  Duodenal stent placed.  FLOURO RM 1-60 MIN  Comparison: Chest CT 11/05/2011.  KUB  11/18/2011.  Findings: Single fluoroscopic spot image.  The mediastinal surgical clips on the patient's left indicate the stent traverses the level of the diaphragm.  The metallic stent has been deployed and is vertically oriented extending from above the level of the diaphragm extending caudally to the epigastric region.  IMPRESSION: Vertically oriented the stent deployed in the region of the duodenum in this patient with previous esophagectomy and gastric pull-through.  Original Report Authenticated By: Harley Hallmark, M.D.   Dg Chest Port 1 View  11/26/2011  *RADIOLOGY REPORT*  Clinical Data: Pneumonia and status post placement of endoscopic stent to treat the gastric outlet obstruction.  PORTABLE CHEST - 1 VIEW  Comparison: 11/22/2011  Findings: Interval placement of a stent extending from the lower chest into the abdomen.  Consolidation of the left lower lobe has increased since the prior study with probable component also of left pleural fluid.  There is a smaller right pleural effusion and persistent atelectasis versus infiltrate at the right lung base. No pulmonary edema.  IMPRESSION: Increased consolidation of the left lower lobe with probable component of left pleural fluid.  Atelectasis at the right lung base with a smaller right pleural effusion.  Original Report Authenticated By: Reola Calkins, M.D.   Dg Kayleen Memos W/water Sol Cm  11/25/2011  *RADIOLOGY REPORT*  Clinical Data:  Remote esophagectomy.  Status post duodenal stent placement yesterday.  Evaluate for stent patency.  UPPER GI SERIES WITH KUB  Technique:  Routine upper GI series was performed with  50 ml Omnipaque-300 (water-soluble contrast).  Fluoroscopy Time: 1.13 minutes  Comparison:  Fluoroscopic images yesterday and one-view abdomen 11/08/2011.  Findings: The scout abdominal radiograph demonstrates a normal bowel gas pattern.  There is a long metallic stent overlapping the diaphragmatic hiatus near the midline.  Cholecystectomy clips  are noted.  The patient ingested the water soluble contrast without difficulty while in the semi-erect position.  No aspiration was observed. There is underlying calcification of the tracheobronchial tree. In the semi erect and decubitus positions, the intrathoracic stomach fills with contrast, but no drainage through the duodenal stent is observed.  The patient was then placed erect.  In the erect position, there is drainage through the duodenal stent with opacification of small bowel loops in the mid abdomen.  No extravasation or significant stricture is identified.  IMPRESSION:  1.  The duodenal stent is patent. 2.  There is only drainage through the stent with the patient in the erect position.  No extravasation is identified.  Original Report Authenticated By: Gerrianne Scale, M.D.    Medications: Scheduled Meds:    . antiseptic oral rinse  15 mL Mouth Rinse BID  . bisacodyl  10 mg Rectal Once  . bisoprolol-hydrochlorothiazide  1 tablet Oral Daily  . calcium-vitamin D  1 tablet Oral TID WC  . ceFEPime (MAXIPIME) IV  1 g Intravenous Q12H  . digoxin  0.125 mg Intravenous Daily  . docusate sodium  100 mg Oral BID  . Hemocyte Plus  106 mg Oral BID  . levofloxacin (LEVAQUIN) IV  750 mg Intravenous Q48H  . loratadine  5 mg Oral QPM  . magic mouthwash w/lidocaine  5 mL Oral TID  . metoCLOPramide  5 mg Oral TID AC  . potassium chloride  10 mEq Intravenous Q1 Hr x 4  . potassium phosphate IVPB (mmol)  10 mmol Intravenous Once  . sodium phosphate  1 enema Rectal Once  . sucralfate  1 g Oral BID AC  . vancomycin  500 mg Intravenous Q8H  . Vitamin D (Ergocalciferol)  50,000 Units Oral Q7 days   Continuous Infusions:    . sodium chloride 50 mL/hr at 11/26/11 0603   PRN Meds:.acetaminophen, acetaminophen, albuterol, albuterol, bisacodyl, LORazepam, morphine injection, ondansetron, phenol, promethazine, DISCONTD: LORazepam  Assessment/Plan:  Principal Problem:  *Vomiting Active  Problems:  ANEMIA-IRON DEFICIENCY  HYPERTENSION  GERD  HX OF ESOPHAGEAL CANCER  Asthma with bronchitis  Esophageal stricture  Gastric outlet obstruction  Hypokalemia  Atrial fibrillation with rapid ventricular response  Constipation  Severe malnutrition  Vomiting secondary to multifactorial causes including: Esophageal stricture and Gastric outlet obstruction in a patient with a history of esophageal cancer status post surgical resection, gastroparesis, volvulus.  Status post EGD 11/24/11 with duodenal stent placement. Seems to be doing well and tolerating diet. Gastroenterology consultation performed by Dr. Marina Goodell on 11/17/2011.  Status post EGD 10/19/2011 showed her anastomosis to be at 17 cm with stenosis at the anastomosis, which was dilated.  EGD at that time also showed gastritis with biopsies negative for H. Pylori.  Hospitalized 11/05/2011-11/10/2011 for what was thought to be a gastroparesis flare. An upper GI series was suspicious for delayed gastric emptying and an EGD failed to reveal any evidence of gastric outlet obstruction. She responded to NG tube decompression.  Zofran and Phenergan ordered on admission for symptomatic treatment.  On Reglan chronically to address gastroparesis.  Dr. Gerrit Friends consulted on 11/18/2011 per Dr. Lamar Sprinkles recommendations and no surgical intervention recommended.  Further evaluation done by  Dr. Tyrone Sage on 11/21/2011 with recommendations for consideration of balloon dilatation of the pyloroplasty versus placement of a jejunostomy tube with enteral tube feedings.  Diet advanced to regular on 11/19/2011, but could not tolerate this for a long.  NG tube for gastric decompression ordered by the gastroenterologists on 11/21/2011.  EGD performed on 11/22/2011 which showed a very tortuous and collapsed pylorus and proximal small bowel. Functional obstruction was noted. Duodenal stent encompassing the pylorus and her proximal small bowel recommended and delayed 4/24  secondary to HR. Re-scheduled for today as HR controlled S/P duodenal stent placement 11/24/11. Tolerating clear liquids this am. No pain/nausea/vomiting. Upper GI series pending. May be able to advance diet depending results. Defer to GI  Diarrhea May have something to do with reintroduction of diet. ?reglan is contributing too. Will defer to GI. Will stop stool softeners and laxatives. If doesn't resolve may need stool studies as she is on broad spectrum antibiotics.  Aspiration pneumonia  The patient was found to be hypoxic on 11/22/2011.  A chest x-ray done on 11/22/2011 showed perihilar infiltrates concerning for pulmonary edema.  Patient was put on broad-spectrum antibiotics as noted below, for treatment of presumed aspiration pneumonia. Cefepime and levaquin day #4. Sats 92-98 on 2L. Afebrile, non-toxic appearing Will de-escalate antibiotics. Leave on Levaquin. Unfortunately, no cultures were done.  ANEMIA-IRON DEFICIENCY  Hemoglobin 11.8-14.1, stable.  HYPERTENSION  Blood pressure stable  Continue bisoprolol/HCTZ.  GERD  Continue Prevacid.  HX OF ESOPHAGEAL CANCER  No evidence of recurrence.  Asthma with bronchitis  Stable. albuterol HFA prn  Atrial fibrillation  Nursing staff reported an irregular and rapid heart rate on 11/23/2011.  12-lead EKG confirms atrial fibrillation with rapid ventricular response, ventricular rate 154 beats per minute  Rate controlled. HR range 93-105. Will continue dig. Monitor. Not on anticoagulation.  Hypokalemia  Secondary to GI losses.  Replace today with 4 runs IV. Goal for potassium 4.0 per pharmacy with dig on board.  Mg level 1.5. Will replete as well.  Will recheck in am.  Hypophosphoremia Replete.   Severe malnutrition  Related to intractable nausea and vomiting.  Seen by dietitian on 11/18/2011.  BMI 20.69.  See #1. Tolerating clear liquids so far.      LOS: 9 days   Afton Lavalle 11/26/2011, 12:54 PM

## 2011-11-27 LAB — BASIC METABOLIC PANEL
BUN: 3 mg/dL — ABNORMAL LOW (ref 6–23)
Creatinine, Ser: 0.68 mg/dL (ref 0.50–1.10)
GFR calc Af Amer: >90 mL/min (ref >90)
GFR calc non Af Amer: 80 mL/min — ABNORMAL LOW (ref >90)
Potassium: 3.5 mEq/L (ref 3.5–5.1)

## 2011-11-27 LAB — PHOSPHORUS: Phosphorus: 2.4 mg/dL (ref 2.3–4.6)

## 2011-11-27 MED ORDER — POTASSIUM CHLORIDE 20 MEQ/15ML (10%) PO LIQD
40.0000 meq | Freq: Once | ORAL | Status: AC
  Administered 2011-11-27: 40 meq via ORAL
  Filled 2011-11-27: qty 30

## 2011-11-27 MED ORDER — PANTOPRAZOLE SODIUM 40 MG PO TBEC
40.0000 mg | DELAYED_RELEASE_TABLET | Freq: Every day | ORAL | Status: DC
  Administered 2011-11-27 – 2011-11-30 (×4): 40 mg via ORAL
  Filled 2011-11-27 (×3): qty 1

## 2011-11-27 MED ORDER — POTASSIUM CHLORIDE 10 MEQ/100ML IV SOLN
10.0000 meq | INTRAVENOUS | Status: DC
  Filled 2011-11-27 (×4): qty 100

## 2011-11-27 MED ORDER — DEXTROSE-NACL 5-0.45 % IV SOLN
INTRAVENOUS | Status: DC
  Administered 2011-11-27 – 2011-11-29 (×3): via INTRAVENOUS

## 2011-11-27 NOTE — Progress Notes (Signed)
Text page to MD as pt does not want anymore IV K+. Nurse slowed rate, added heat packs to PIV site, and pt c/o to complain. Dr. Rito Ehrlich states he will change K+ to elixir.

## 2011-11-27 NOTE — Progress Notes (Signed)
ANTIBIOTIC CONSULT NOTE - FOLLOW UP  Pharmacy Consult for Levaquin Indication: aspiration PNA  Allergies  Allergen Reactions  . Erythromycin Ethylsuccinate Other (See Comments)    REACTION: unspecified  . Prednisone Other (See Comments)    REACTION: unspecified    Patient Measurements: Height: 5\' 3"  (160 cm) Weight: 124 lb 11.2 oz (56.564 kg) IBW/kg (Calculated) : 52.4   Vital Signs: Temp: 98 F (36.7 C) (04/28 0442) Temp src: Oral (04/28 0442) BP: 124/76 mmHg (04/28 0442) Pulse Rate: 72  (04/28 0442) Intake/Output from previous day: 04/27 0701 - 04/28 0700 In: 500 [P.O.:400; IV Piggyback:100] Out: 950 [Urine:950] Intake/Output from this shift:    Labs:  Basename 11/27/11 0528 11/26/11 0535 11/25/11 0441  WBC -- -- 5.6  HGB -- -- 12.4  PLT -- -- 160  LABCREA -- -- --  CREATININE 0.68 0.70 0.66   Estimated Creatinine Clearance: 46.4 ml/min (by C-G formula based on Cr of 0.68).  Basename 11/25/11 1652  VANCOTROUGH 21.3*  VANCOPEAK --  Drue Dun --  GENTTROUGH --  GENTPEAK --  GENTRANDOM --  TOBRATROUGH --  TOBRAPEAK --  TOBRARND --  AMIKACINPEAK --  AMIKACINTROU --  AMIKACIN --     Microbiology: No results found for this or any previous visit (from the past 720 hour(s)).  Anti-infectives     Start     Dose/Rate Route Frequency Ordered Stop   11/26/11 0800   levofloxacin (LEVAQUIN) IVPB 750 mg        750 mg 100 mL/hr over 90 Minutes Intravenous Every 48 hours 11/24/11 0917 12/02/11 0759   11/24/11 0200   vancomycin (VANCOCIN) 500 mg in sodium chloride 0.9 % 100 mL IVPB  Status:  Discontinued        500 mg 100 mL/hr over 60 Minutes Intravenous Every 8 hours 11/23/11 2234 11/26/11 1304   11/22/11 0600   levofloxacin (LEVAQUIN) IVPB 750 mg  Status:  Discontinued        750 mg 100 mL/hr over 90 Minutes Intravenous Every 24 hours 11/22/11 0600 11/24/11 0917   11/22/11 0600   ceFEPIme (MAXIPIME) 1 g in dextrose 5 % 50 mL IVPB  Status:  Discontinued         1 g 100 mL/hr over 30 Minutes Intravenous Every 12 hours 11/22/11 0600 11/26/11 1304   11/22/11 0600   vancomycin (VANCOCIN) 500 mg in sodium chloride 0.9 % 100 mL IVPB  Status:  Discontinued        500 mg 100 mL/hr over 60 Minutes Intravenous Every 12 hours 11/22/11 0600 11/23/11 2234          Assessment: 82 M on Day 5 levaquin for aspiration PNA (Vanc and cefepime discontinued 4/27). Renal function stable. No micro data.  Pt improving per MD, will continue de-escalated abx therapy with levaquin for now.   Goal of Therapy:  Levaquin per renal function  Plan:  1.) Continue levaquin 750 mg IV q48h (extended interval for CrCl 20-49 ml/min) 2.) Monitor renal function  Pharmacy will sign off as renal function stable, please re-consult if needed further. Thanks! Suzanna Zahn, Loma Messing PharmD 1:39 PM 11/27/2011

## 2011-11-27 NOTE — Progress Notes (Signed)
Subjective: Patient feels better but says she couldn't sleep well last night for no particular reason. Less short of breath. Tolerating diet. Says diarrhea has stopped. Still with some soreness in throat. Feels stronger.  Objective: Vital signs Filed Vitals:   11/26/11 0545 11/26/11 1315 11/26/11 2058 11/27/11 0442  BP: 120/78 134/82 116/78 124/76  Pulse: 71 80 78 72  Temp: 98.6 F (37 C) 98.2 F (36.8 C) 99 F (37.2 C) 98 F (36.7 C)  TempSrc: Oral Oral Oral Oral  Resp: 20 20 18 18   Height:      Weight: 56.6 kg (124 lb 12.5 oz)   56.564 kg (124 lb 11.2 oz)  SpO2: 94% 94% 91% 91%   Weight change: -0.037 kg (-1.3 oz) Last BM Date: 11/25/11  Intake/Output from previous day: 04/27 0701 - 04/28 0700 In: 500 [P.O.:400; IV Piggyback:100] Out: 950 [Urine:950]     Physical Exam: General: Alert, awake, oriented x3, in no acute distress. HEENT: Normocephalic. Heart: Irregularly irregular rythm, without murmurs, rubs, gallops. Lungs: Normal effort. Breath sounds distant but clear to auscultation bilaterally. No wheeze.  Abdomen: Soft, nontender, nondistended, positive bowel sounds. Extremities: No clubbing cyanosis or edema with positive pedal pulses. Neuro: Grossly intact, nonfocal. Speech clear. Voice stronger today  Lab Results: Basic Metabolic Panel:  Basename 11/27/11 0528 11/26/11 0535 11/25/11 0441  NA 138 137 --  K 3.5 3.2* --  CL 103 101 --  CO2 29 31 --  GLUCOSE 88 95 --  BUN 3* 4* --  CREATININE 0.68 0.70 --  CALCIUM 8.5 9.2 --  MG -- 1.6 1.5  PHOS 2.4 2.0* --   CBC:  Basename 11/25/11 0441  WBC 5.6  NEUTROABS --  HGB 12.4  HCT 38.8  MCV 88.0  PLT 160   Studies/Results: Dg Chest Port 1 View  11/26/2011  *RADIOLOGY REPORT*  Clinical Data: Pneumonia and status post placement of endoscopic stent to treat the gastric outlet obstruction.  PORTABLE CHEST - 1 VIEW  Comparison: 11/22/2011  Findings: Interval placement of a stent extending from the lower chest  into the abdomen.  Consolidation of the left lower lobe has increased since the prior study with probable component also of left pleural fluid.  There is a smaller right pleural effusion and persistent atelectasis versus infiltrate at the right lung base. No pulmonary edema.  IMPRESSION: Increased consolidation of the left lower lobe with probable component of left pleural fluid.  Atelectasis at the right lung base with a smaller right pleural effusion.  Original Report Authenticated By: Reola Calkins, M.D.   Dg Kayleen Memos W/water Sol Cm  11/25/2011  *RADIOLOGY REPORT*  Clinical Data:  Remote esophagectomy.  Status post duodenal stent placement yesterday.  Evaluate for stent patency.  UPPER GI SERIES WITH KUB  Technique:  Routine upper GI series was performed with 50 ml Omnipaque-300 (water-soluble contrast).  Fluoroscopy Time: 1.13 minutes  Comparison:  Fluoroscopic images yesterday and one-view abdomen 11/08/2011.  Findings: The scout abdominal radiograph demonstrates a normal bowel gas pattern.  There is a long metallic stent overlapping the diaphragmatic hiatus near the midline.  Cholecystectomy clips are noted.  The patient ingested the water soluble contrast without difficulty while in the semi-erect position.  No aspiration was observed. There is underlying calcification of the tracheobronchial tree. In the semi erect and decubitus positions, the intrathoracic stomach fills with contrast, but no drainage through the duodenal stent is observed.  The patient was then placed erect.  In the erect position, there is  drainage through the duodenal stent with opacification of small bowel loops in the mid abdomen.  No extravasation or significant stricture is identified.  IMPRESSION:  1.  The duodenal stent is patent. 2.  There is only drainage through the stent with the patient in the erect position.  No extravasation is identified.  Original Report Authenticated By: Gerrianne Scale, M.D.     Medications: Scheduled Meds:    . antiseptic oral rinse  15 mL Mouth Rinse BID  . bisoprolol  2.5 mg Oral Daily  . calcium-vitamin D  1 tablet Oral TID WC  . digoxin  0.125 mg Intravenous Daily  . Hemocyte Plus  106 mg Oral BID  . levofloxacin (LEVAQUIN) IV  750 mg Intravenous Q48H  . loratadine  5 mg Oral QPM  . magic mouthwash w/lidocaine  5 mL Oral TID  . metoCLOPramide  5 mg Oral TID AC  . potassium chloride  10 mEq Intravenous Q1 Hr x 4  . potassium phosphate IVPB (mmol)  10 mmol Intravenous Once  . sucralfate  1 g Oral BID AC  . Vitamin D (Ergocalciferol)  50,000 Units Oral Q7 days  . DISCONTD: bisacodyl  10 mg Rectal Once  . DISCONTD: bisoprolol-hydrochlorothiazide  1 tablet Oral Daily  . DISCONTD: ceFEPime (MAXIPIME) IV  1 g Intravenous Q12H  . DISCONTD: docusate sodium  100 mg Oral BID  . DISCONTD: sodium phosphate  1 enema Rectal Once  . DISCONTD: vancomycin  500 mg Intravenous Q8H   Continuous Infusions:    . sodium chloride 50 mL/hr at 11/26/11 0603   PRN Meds:.acetaminophen, acetaminophen, albuterol, albuterol, LORazepam, morphine injection, ondansetron, phenol, promethazine, DISCONTD: bisacodyl  Assessment/Plan:  Principal Problem:  *Vomiting Active Problems:  ANEMIA-IRON DEFICIENCY  HYPERTENSION  GERD  HX OF ESOPHAGEAL CANCER  Asthma with bronchitis  Esophageal stricture  Gastric outlet obstruction  Hypokalemia  Atrial fibrillation with rapid ventricular response  Constipation  Severe malnutrition  Vomiting secondary to multifactorial causes including: Esophageal stricture and Gastric outlet obstruction in a patient with a history of esophageal cancer status post surgical resection, gastroparesis, volvulus.  Status post EGD 11/24/11 with duodenal stent placement. Seems to be doing well and tolerating diet. Gastroenterology consultation performed by Dr. Marina Goodell on 11/17/2011.  Status post EGD 10/19/2011 showed her anastomosis to be at 17 cm with  stenosis at the anastomosis, which was dilated.  EGD at that time also showed gastritis with biopsies negative for H. Pylori.  Hospitalized 11/05/2011-11/10/2011 for what was thought to be a gastroparesis flare. An upper GI series was suspicious for delayed gastric emptying and an EGD failed to reveal any evidence of gastric outlet obstruction. She responded to NG tube decompression.  Zofran and Phenergan ordered on admission for symptomatic treatment.  On Reglan chronically to address gastroparesis.  Dr. Gerrit Friends consulted on 11/18/2011 per Dr. Lamar Sprinkles recommendations and no surgical intervention recommended.  Further evaluation done by Dr. Tyrone Sage on 11/21/2011 with recommendations for consideration of balloon dilatation of the pyloroplasty versus placement of a jejunostomy tube with enteral tube feedings.  Diet advanced to regular on 11/19/2011, but could not tolerate this for a long.  NG tube for gastric decompression ordered by the gastroenterologists on 11/21/2011.  EGD performed on 11/22/2011 which showed a very tortuous and collapsed pylorus and proximal small bowel. Functional obstruction was noted. Duodenal stent encompassing the pylorus and her proximal small bowel recommended and delayed 4/24 secondary to HR. Re-scheduled for today as HR controlled S/P duodenal stent placement 11/24/11. Tolerating clear  liquids this am. No pain/nausea/vomiting. Upper GI series pending. May be able to advance diet depending results. Defer to GI  Diarrhea Apparently has stopped. May have something to do with reintroduction of diet or due to antibiotics. ?reglan is contributing too. Will defer to GI if she has recurrence. Have stopped stool softeners and laxatives.   Aspiration pneumonia  The patient was found to be hypoxic on 11/22/2011.  A chest x-ray done on 11/22/2011 showed perihilar infiltrates concerning for pulmonary edema.  Patient was put on broad-spectrum antibiotics as noted below, for treatment of  presumed aspiration pneumonia. Cefepime and levaquin day #4. Sats 92-98 on 2L. Afebrile, non-toxic appearing Will de-escalate antibiotics. Leave on Levaquin (day 6). CXR done yesterday reveals persistent consolidation on Left. Will not alter management as she is feeling better. Repeat CXR in 4-6 weeks. Unfortunately, no cultures were done.  Sore Throat Probably due to EGD. Continue with Chloraseptic spray.  ANEMIA-IRON DEFICIENCY  Hemoglobin 11.8-14.1, stable.  HYPERTENSION  Blood pressure stable  Continue bisoprolol  GERD  Continue Prevacid.  HX OF ESOPHAGEAL CANCER  No evidence of recurrence.  Asthma with bronchitis  Stable. albuterol HFA prn  New Atrial fibrillation (This admission) Nursing staff reported an irregular and rapid heart rate on 11/23/2011.  12-lead EKG confirmed atrial fibrillation with rapid ventricular response, ventricular rate 154 beats per minute  Rate controlled. HR range 93-105. Will continue dig. Not on anticoagulation. Repeat EKG. Get ECHO to get EF so that CHADS2 can be calculated. At the very least she should be on Aspirin which will be challenging due to Gastritis.  Hypokalemia  Improved. Secondary to GI losses.  Try to keep greater than 4.  Will recheck in am.  Hypophosphoremia Repleted.   Severe malnutrition  Related to intractable nausea and vomiting.  Seen by dietitian on 11/18/2011.  BMI 20.69.  See #1. Tolerating D3 Diet.      LOS: 10 days   [name] 11/27/2011, 8:10 AM

## 2011-11-27 NOTE — Evaluation (Signed)
Physical Therapy Evaluation Patient Details Name: [name] MRN: [id number] DOB: 01/06/1929 Today's Date: 11/27/2011 Time: 4098-1191 PT Time Calculation (min): 31 min  PT Assessment / Plan / Recommendation Clinical Impression  Patient admitted with nausea and vomiting s/p duodenal stent placed due to gastric outlet obstruction.  She will benefit from skilled PT in the acute setting to maximize independence and allow return home with HHPT and aide assistance at discharge.  Will need nursing assist as well for mobility in hallway for improved strength and tolerance to activities.    PT Assessment  Patient needs continued PT services    Follow Up Recommendations  Home health PT (and aide)    Equipment Recommendations   (TBA if needs cane vs walker)    Frequency Min 3X/week    Precautions / Restrictions Precautions Precautions: Fall   Pertinent Vitals/Pain C/o mild throat soreness since EGD      Mobility  Bed Mobility Details for Bed Mobility Assistance: Patient slept in recliner (does this often at home too, though reports has bed at home that elevates her head Transfers Transfers: Sit to Stand;Stand to Sit Sit to Stand: 5: Supervision;With upper extremity assist;From chair/3-in-1;From toilet Stand to Sit: 5: Supervision;With armrests;With upper extremity assist;To chair/3-in-1;To toilet Details for Transfer Assistance: supervision for safety Ambulation/Gait Ambulation/Gait Assistance: 4: Min guard Ambulation Distance (Feet): 300 Feet Assistive device: None Ambulation/Gait Assistance Details: occasionally veers from straight path, initially pushed IV pole, then without UE assist, with minguard assist Gait Pattern: Step-through pattern;Decreased stride length Stairs: Yes Stairs Assistance: 4: Min guard Stair Management Technique: One rail Right;Alternating pattern;Step to pattern Number of Stairs: 5  (times two trials, cue for step to second attempt)    Exercises      PT Goals Acute Rehab PT Goals PT Goal Formulation: With patient Time For Goal Achievement: 12/02/11 Potential to Achieve Goals: Good Pt will go Sit to Stand: with modified independence PT Goal: Sit to Stand - Progress: Goal set today Pt will go Stand to Sit: with modified independence PT Goal: Stand to Sit - Progress: Goal set today Pt will Ambulate: >150 feet;with modified independence;with least restrictive assistive device PT Goal: Ambulate - Progress: Goal set today Pt will Go Up / Down Stairs: 3-5 stairs;with modified independence;with rail(s) PT Goal: Up/Down Stairs - Progress: Goal set today  Visit Information  Last PT Received On: 11/27/11 Assistance Needed: +1    Subjective Data  Subjective: Already walked with the nurse.  Happy to get to walk today. Patient Stated Goal: To go home   Prior Functioning  Home Living Lives With: Alone Available Help at Discharge: Neighbor Type of Home: House Home Access: Stairs to enter Secretary/administrator of Steps: 3-4 Entrance Stairs-Rails: Right Home Layout: One level Bathroom Shower/Tub: Health visitor: Handicapped height Home Adaptive Equipment: Bedside commode/3-in-1;Grab bars in shower Prior Function Level of Independence: Independent Able to Take Stairs?: Yes Driving: Yes Communication Communication: No difficulties    Cognition  Overall Cognitive Status: Appears within functional limits for tasks assessed/performed Arousal/Alertness: Awake/alert Orientation Level: Appears intact for tasks assessed Behavior During Session: [facility] for tasks performed    Extremity/Trunk Assessment Right Lower Extremity Assessment RLE ROM/Strength/Tone: [facility] for tasks assessed Left Lower Extremity Assessment LLE ROM/Strength/Tone: [facility] for tasks assessed Trunk Assessment Trunk Assessment: Kyphotic;Other exceptions Trunk Exceptions: cervical left lateral flexion and rotation with h/o surgical incision on left side of  neck   Balance Balance Balance Assessed: Yes Dynamic Standing Balance Dynamic Standing - Balance Support:  No upper extremity supported;During functional activity Dynamic Standing - Level of Assistance: 5: Stand by assistance Dynamic Standing - Comments: washing hands at sink, little LOB turning right to get towels, supervision for recovery  End of Session PT - End of Session Equipment Utilized During Treatment: Gait belt Activity Tolerance: Patient tolerated treatment well Patient left: in chair;with call bell/phone within reach;with chair alarm set   [facility] 11/27/2011, 12:35 PM

## 2011-11-27 NOTE — Progress Notes (Signed)
Patient ID: [name], female   DOB: Apr 24, 1929, 78 y.o.   MRN: [id number] [facility] Progress Note  Subjective: Doping well, did not get any breakfast and is hungry! No problems with regurgitation. Up walking,getting stronger. Says k+ hurts through the IV and does not want any more Iv  Objective:  Vital signs in last 24 hours: Temp:  [98 F (36.7 C)-99 F (37.2 C)] 98 F (36.7 C) (04/28 0442) Pulse Rate:  [72-80] 72  (04/28 0442) Resp:  [18-20] 18  (04/28 0442) BP: (116-134)/(76-82) 124/76 mmHg (04/28 0442) SpO2:  [91 %-94 %] 91 % (04/28 0442) Weight:  [124 lb 11.2 oz (56.564 kg)] 124 lb 11.2 oz (56.564 kg) (04/28 0442) Last BM Date: 11/25/11 General:   Alert,  Well-developed,    in NAD Heart: irr Regular rate and rhythm; no murmurs Pulm;clear Abdomen:  Soft, nontender and nondistended. Normal bowel sounds, without guarding, and without rebound.   Extremities:  Without edema. Neurologic:  Alert and  oriented x4;  grossly normal neurologically. Psych:  Alert and cooperative. Normal mood and affect.  Intake/Output from previous day: 04/27 0701 - 04/28 0700 In: 500 [P.O.:400; IV Piggyback:100] Out: 950 [Urine:950] Intake/Output this shift:    Lab Results:  Basename 11/25/11 0441  WBC 5.6  HGB 12.4  HCT 38.8  PLT 160   BMET  Basename 11/27/11 0528 11/26/11 0535 11/25/11 0441  NA 138 137 137  K 3.5 3.2* 3.3*  CL 103 101 101  CO2 29 31 30   GLUCOSE 88 95 93  BUN 3* 4* 7  CREATININE 0.68 0.70 0.66  CALCIUM 8.5 9.2 9.0    Assessment / Plan: #1 78 yo female with functional gastric outlet obstruction secondary to deformed pylorus-doing well s/p pyloric stent placement. Will need outpt follow-up in a few weeks post d/c Continue soft diet, and upright positioning Continue Reglan, convert to oral #2 hypokalemia-corrected- give orally if needs further #3 ?aspiration pneumonia- she has been on Maxipime and Levaquin for several days would d/c and  observe Principal Problem:  *Vomiting Active Problems:  ANEMIA-IRON DEFICIENCY  HYPERTENSION  GERD  HX OF ESOPHAGEAL CANCER  Asthma with bronchitis  Esophageal stricture  Gastric outlet obstruction  Hypokalemia  Atrial fibrillation with rapid ventricular response  Constipation  Severe malnutrition     LOS: 10 days   Tiffani Kadow  11/27/2011, 9:21 AM

## 2011-11-28 LAB — BASIC METABOLIC PANEL
BUN: 3 mg/dL — ABNORMAL LOW (ref 6–23)
GFR calc Af Amer: >90 mL/min (ref >90)
GFR calc non Af Amer: 80 mL/min — ABNORMAL LOW (ref >90)
Potassium: 3.5 mEq/L (ref 3.5–5.1)

## 2011-11-28 MED ORDER — POTASSIUM CHLORIDE 20 MEQ/15ML (10%) PO LIQD
40.0000 meq | Freq: Once | ORAL | Status: AC
  Administered 2011-11-28: 40 meq via ORAL
  Filled 2011-11-28 (×2): qty 30

## 2011-11-28 NOTE — Progress Notes (Signed)
   CARE MANAGEMENT NOTE 11/28/2011  Patient:  [name], [name]   Account Number:  [id number]  Date Initiated:  11/23/2011  Documentation initiated by:  Lanier Clam  Subjective/Objective Assessment:   TRANSFERRED TO [facility],[email or web address] [place].     Action/Plan:   FROM HOME ALONE   Anticipated DC Date:  12/01/2011   Anticipated DC Plan:  HOME W HOME HEALTH SERVICES         Choice offered to / List presented to:             Status of service:  In process, will continue to follow Medicare Important Message given?   (If response is "NO", the following Medicare IM given date fields will be blank) Date Medicare IM given:   Date Additional Medicare IM given:    Discharge Disposition:    Per UR Regulation:  Reviewed for med. necessity/level of care/duration of stay  If discussed at Long Length of Stay Meetings, dates discussed:   11/23/2011    Comments:  11/28/11 Sharie Amorin RN,BSN NCM [phone] PT-HH.OT-SNF.HHC AGENCY LIST GIVEN.CSW NOTIFIED.  11/23/11 Yovanna Cogan RN,BSN NCM [phone] EGD/STENT CANCELLED TODAY.SX FOLLOWING.

## 2011-11-28 NOTE — Progress Notes (Signed)
   CARE MANAGEMENT NOTE 11/28/2011  Patient:  VALA, RAFFO   Account Number:  [id number]  Date Initiated:  11/23/2011  Documentation initiated by:  Lanier Clam  Subjective/Objective Assessment:   TRANSFERRED TO [email or web address] [place].     Action/Plan:   FROM HOME ALONE   Anticipated DC Date:  12/01/2011   Anticipated DC Plan:  HOME W HOME HEALTH SERVICES         Choice offered to / List presented to:             Status of service:  In process, will continue to follow Medicare Important Message given?   (If response is "NO", the following Medicare IM given date fields will be blank) Date Medicare IM given:   Date Additional Medicare IM given:    Discharge Disposition:    Per UR Regulation:  Reviewed for med. necessity/level of care/duration of stay  If discussed at Long Length of Stay Meetings, dates discussed:   11/23/2011    Comments:  11/28/11 Tyjanae Bartek RN,BSN NCM [phone] INPT REHAB WILL ASSESS,& LET us KNOW IF PAITENT IS A CANDIDATE.PATIENT HAS HHC AGENCY LIST TO CHOOSE IF NEEDED. PT-HH.OT-SNF.HHC AGENCY LIST GIVEN.CSW NOTIFIED.  11/23/11 Jamarria Real RN,BSN NCM [phone] EGD/STENT CANCELLED TODAY.SX FOLLOWING.

## 2011-11-28 NOTE — Progress Notes (Signed)
Physical Therapy Treatment Patient Details Name: [name] MRN: [id number] DOB: 10/17/1928 Today's Date: 11/28/2011 Time: 1914-7829 PT Time Calculation (min): 20 min 1 ta  PT Assessment / Plan / Recommendation Comments on Treatment Session  Pt scored 42/56 on Berg balance test; discussed short term SNF with pt who stated she [facility] [place].  Notified nursing that PT supports OT rec. for SNF. Pt's gait to/from bathroom was also unstable.  Pt was walking with IV pole, but will suggest to PT using RW for saftey    Follow Up Recommendations  Other (comment) (will discuss with LPT SNF due to pt failed Berg Balance Test)    Equipment Recommendations  Other (comment);Defer to next venue    Frequency     Plan      Precautions / Restrictions Precautions Precautions: Fall       Mobility  Bed Mobility Bed Mobility: Not assessed Sit to Supine: 5: Supervision Transfers Sit to Stand: 5: Supervision Stand to Sit: 5: Supervision    Exercises     PT Goals Acute Rehab PT Goals PT Goal Formulation: With patient Pt will go Sit to Stand: with modified independence PT Goal: Sit to Stand - Progress: Progressing toward goal Pt will go Stand to Sit: with modified independence PT Goal: Stand to Sit - Progress: Progressing toward goal  Visit Information  Last PT Received On: 11/28/11 Assistance Needed: +1    Subjective Data  Subjective: Pt sitting in chair "need to use the bathroom" Patient Stated Goal: want to get back to walking 2 miles/day   Cognition  Overall Cognitive Status: Appears within functional limits for tasks assessed/performed Arousal/Alertness: Awake/[name] Orientation Level: Appears intact for tasks assessed Behavior During Session: [facility] for tasks performed    Balance  Standardized Balance Assessment Standardized Balance Assessment: Berg Balance Test Berg Balance Test Sit to Stand: Able to stand without using hands and  stabilize independently Standing Unsupported: Able to stand safely 2 minutes Sitting with Back Unsupported but Feet Supported on Floor or Stool: Able to sit safely and securely 2 minutes Stand to Sit: Sits safely with minimal use of hands Transfers: Able to transfer safely, minor use of hands Standing Unsupported with Eyes Closed: Able to stand 10 seconds safely Standing Ubsupported with Feet Together: Needs help to attain position but able to stand for 30 seconds with feet together From Standing, Reach Forward with Outstretched Arm: Can reach confidently >25 cm (10") From Standing Position, Pick up Object from Floor: Able to pick up shoe safely and easily From Standing Position, Turn to Look Behind Over each Shoulder: Looks behind from both sides and weight shifts well Turn 360 Degrees: Able to turn 360 degrees safely in 4 seconds or less Standing Unsupported, Alternately Place Feet on Step/Stool: Able to complete >2 steps/needs minimal assist Standing Unsupported, One Foot in Front: Loses balance while stepping or standing Standing on One Leg: Unable to try or needs assist to prevent fall Score 42/56 which indicates VERY HIGH FALL RISK   End of Session PT - End of Session Equipment Utilized During Treatment: Gait belt Activity Tolerance: Patient tolerated treatment well Patient left: in chair;with call bell/phone within reach Nurse Communication: Other (comment) (berg test done; PT rec short term SNF ([facility]))    [name], SPTA 11/28/2011, 3:23 PM  [name]  PTA [facility]  Acute  Rehab Pager     [phone]  Requiring LPT Co Signature due to change  in D/C plan.  11/29/11-Noted PTA tx note and need for updated d/c plan to [facility] with new d/c plan. [name], PT [phone]

## 2011-11-28 NOTE — Progress Notes (Signed)
Subjective: "I want to get stronger." Denies pain/discomfort. Eating small amount. Reports loose stool after meals.   Objective: Vital signs Filed Vitals:   11/27/11 1357 11/27/11 2302 11/28/11 0449 11/28/11 1038  BP: 109/74 113/79 98/64 110/70  Pulse: 86 79 74   Temp: 97.9 F (36.6 C) 98.9 F (37.2 C) 98.4 F (36.9 C)   TempSrc: Oral Oral Oral   Resp: 18 18 18    Height:      Weight:      SpO2: 94% 94% 93%    Weight change:  Last BM Date: 11/25/11  Intake/Output from previous day: 04/28 0701 - 04/29 0700 In: -  Out: 600 [Urine:600] Total I/O In: -  Out: 240 [Urine:240]   Physical Exam: General: Alert, awake, oriented x3, in no acute distress. Up in chair.  HEENT: No bruits, no goiter. Mucus membranes mouth moist/pink Heart: Regular rate and rhythm, without murmurs, rubs, gallops. Lungs:  Normal effort. Breath sounds clear to auscultation bilaterally. No wheeze, rhonchi Abdomen: Soft, nontender, nondistended, positive bowel sounds. Extremities: No clubbing cyanosis or edema with positive pedal pulses. Neuro: Grossly intact, nonfocal. Speech clear. Voice improving but still raspy.     Lab Results: Basic Metabolic Panel:  Basename 11/28/11 0440 11/27/11 0528 11/26/11 0535  NA 137 138 --  K 3.5 3.5 --  CL 102 103 --  CO2 29 29 --  GLUCOSE 93 88 --  BUN 3* 3* --  CREATININE 0.69 0.68 --  CALCIUM 8.6 8.5 --  MG -- -- 1.6  PHOS -- 2.4 2.0*   Liver Function Tests: No results found for this basename: AST:2,ALT:2,ALKPHOS:2,BILITOT:2,PROT:2,ALBUMIN:2 in the last 72 hours No results found for this basename: LIPASE:2,AMYLASE:2 in the last 72 hours No results found for this basename: AMMONIA:2 in the last 72 hours CBC: No results found for this basename: WBC:2,NEUTROABS:2,HGB:2,HCT:2,MCV:2,PLT:2 in the last 72 hours Cardiac Enzymes: No results found for this basename: CKTOTAL:3,CKMB:3,CKMBINDEX:3,TROPONINI:3 in the last 72 hours BNP: No results found for this  basename: PROBNP:3 in the last 72 hours D-Dimer: No results found for this basename: DDIMER:2 in the last 72 hours CBG:  Basename 11/28/11 0845 11/27/11 0726 11/26/11 0754  GLUCAP 91 82 95   Hemoglobin A1C: No results found for this basename: HGBA1C in the last 72 hours Fasting Lipid Panel: No results found for this basename: CHOL,HDL,LDLCALC,TRIG,CHOLHDL,LDLDIRECT in the last 72 hours Thyroid Function Tests: No results found for this basename: TSH,T4TOTAL,FREET4,T3FREE,THYROIDAB in the last 72 hours Anemia Panel: No results found for this basename: VITAMINB12,FOLATE,FERRITIN,TIBC,IRON,RETICCTPCT in the last 72 hours Coagulation: No results found for this basename: LABPROT:2,INR:2 in the last 72 hours Urine Drug Screen: Drugs of Abuse  No results found for this basename: labopia, cocainscrnur, labbenz, amphetmu, thcu, labbarb    Alcohol Level: No results found for this basename: ETH:2 in the last 72 hours Urinalysis: No results found for this basename: COLORURINE:2,APPERANCEUR:2,LABSPEC:2,PHURINE:2,GLUCOSEU:2,HGBUR:2,BILIRUBINUR:2,KETONESUR:2,PROTEINUR:2,UROBILINOGEN:2,NITRITE:2,LEUKOCYTESUR:2 in the last 72 hours Misc. Labs:  No results found for this or any previous visit (from the past 240 hour(s)).  Studies/Results: No results found.  Medications: Scheduled Meds:   . antiseptic oral rinse  15 mL Mouth Rinse BID  . bisoprolol  2.5 mg Oral Daily  . calcium-vitamin D  1 tablet Oral TID WC  . digoxin  0.125 mg Intravenous Daily  . Hemocyte Plus  106 mg Oral BID  . levofloxacin (LEVAQUIN) IV  750 mg Intravenous Q48H  . loratadine  5 mg Oral QPM  . magic mouthwash w/lidocaine  5 mL Oral TID  .  metoCLOPramide  5 mg Oral TID AC  . pantoprazole  40 mg Oral Q1200  . potassium chloride  40 mEq Oral Once  . sucralfate  1 g Oral BID AC  . Vitamin D (Ergocalciferol)  50,000 Units Oral Q7 days   Continuous Infusions:   . dextrose 5 % and 0.45% NaCl 50 mL/hr at 11/27/11 1649     PRN Meds:.acetaminophen, acetaminophen, albuterol, albuterol, LORazepam, morphine injection, ondansetron, phenol, promethazine  Assessment/Plan:  Principal Problem:  *Vomiting Active Problems:  ANEMIA-IRON DEFICIENCY  HYPERTENSION  GERD  HX OF ESOPHAGEAL CANCER  Asthma with bronchitis  Esophageal stricture  Gastric outlet obstruction  Hypokalemia  Atrial fibrillation with rapid ventricular response  Constipation  Severe malnutrition Vomiting secondary to multifactorial causes including: Esophageal stricture and Gastric outlet obstruction in a patient with a history of esophageal cancer status post surgical resection, gastroparesis, volvulus.  Status post EGD 11/24/11 with duodenal stent placement. Seems to be doing well and tolerating soft diet. Loose stools x3 yesterday. One today.  Will stay on soft diet per GI rec. Will continue reglan and protonix per GI. Appreciate GI assistance.   Gastroenterology consultation performed by Dr. Marina Goodell on 11/17/2011.  Status post EGD 10/19/2011 showed her anastomosis to be at 17 cm with stenosis at the anastomosis, which was dilated.  EGD at that time also showed gastritis with biopsies negative for H. Pylori.  Hospitalized 11/05/2011-11/10/2011 for what was thought to be a gastroparesis flare. An upper GI series was suspicious for delayed gastric emptying and an EGD failed to reveal any evidence of gastric outlet obstruction. She responded to NG tube decompression.  Zofran and Phenergan ordered on admission for symptomatic treatment.  On Reglan chronically to address gastroparesis.  Dr. Gerrit Friends consulted on 11/18/2011 per Dr. Lamar Sprinkles recommendations and no surgical intervention recommended.  Further evaluation done by Dr. Tyrone Sage on 11/21/2011 with recommendations for consideration of balloon dilatation of the pyloroplasty versus placement of a jejunostomy tube with enteral tube feedings.  Diet advanced to regular on 11/19/2011, but could not tolerate this  for a long.  NG tube for gastric decompression ordered by the gastroenterologists on 11/21/2011.  EGD performed on 11/22/2011 which showed a very tortuous and collapsed pylorus and proximal small bowel. Functional obstruction was noted. Duodenal stent encompassing the pylorus and her proximal small bowel recommended and delayed 4/24 secondary to HR. Re-scheduled for today as HR controlled  S/P duodenal stent placement 11/24/11. Tolerating clear liquids this am. No pain/nausea/vomiting. Upper GI series pending. May be able to advance diet depending results. Defer to GI Diarrhea   Stool is loose but not as frequent. Reports after meals.  May have something to do with reintroduction of diet or due to antibiotics.  Have stopped stool softeners and laxatives. Will monitor Aspiration pneumonia  The patient was found to be hypoxic on 11/22/2011.  A chest x-ray done on 11/22/2011 showed perihilar infiltrates concerning for pulmonary edema.  Patient was put on broad-spectrum antibiotics as noted below, for treatment of presumed aspiration pneumonia. Cefepime and levaquin day for 4 days. Cefepime discontinued 4/27. Levaquin dosing adjusted for renal function. One more dose due 11/30/11. Sats 91-93 on Room air. Afebrile, non-toxic appearing  CXR done 11/27/11 reveals persistent consolidation on Left. Will not alter management as she is feeling better. Repeat CXR in 4-6 weeks.  Unfortunately, no cultures were done. Sore Throat  Probably due to EGD. Continue with Chloraseptic spray.  ANEMIA-IRON DEFICIENCY  Hemoglobin 11.8-14.1, stable. HYPERTENSION  Blood pressure stable  Continue  bisoprolol SBP range 98-110 GERD  Continue Prevacid. HX OF ESOPHAGEAL CANCER  No evidence of recurrence. Asthma with bronchitis  Stable. albuterol HFA prn New Atrial fibrillation (This admission)  Nursing staff reported an irregular and rapid heart rate on 11/23/2011.  12-lead EKG confirmed atrial fibrillation with rapid  ventricular response, ventricular rate 154 beats per minute  Rate controlled. HR range 66-84. Will continue dig. Not on anticoagulation.   ECHO results pending.  At the very least she should be on Aspirin which will be challenging due to Gastritis. Hypokalemia  Improved. Secondary to GI losses.  Try to keep greater than 4. Will replete and recheck in am Hypophosphoremia  Repleted.  Severe malnutrition  Related to intractable nausea and vomiting.  Seen by dietitian on 11/18/2011.  BMI 20.69.  See #1. Tolerating D3 Diet.   Dispo: Lives alone. Will likely need SNF before going home. Will request SW consult. Hopefully 24-48 hours.       LOS: 11 days   [facility] M 11/28/2011, 1:33 PM

## 2011-11-28 NOTE — Progress Notes (Signed)
Patient seen and examined. Agree with above note. Will consult IP Rehab. Await ECHO. May need anticoagulation for stroke prevention. Discussed with Dr. Arlyce Dice yesterday and he sees no issues with anti-coagulation from GI perspective.  Charmika Macdonnell 2:59 PM

## 2011-11-28 NOTE — Progress Notes (Signed)
*  PRELIMINARY RESULTS* Echocardiogram 2D Echocardiogram has been performed.  [name] 11/28/2011, 2:16 PM 

## 2011-11-28 NOTE — Progress Notes (Signed)
Occupational Therapy Treatment Patient Details Name: [name] MRN: [id number] DOB: 01-16-1929 Today's Date: 11/28/2011 Time: 4098-1191 OT Time Calculation (min): 27 min  OT Assessment / Plan / Recommendation    Follow Up Recommendations  Skilled nursing facility       Frequency Min 2X/week   Plan Discharge plan remains appropriate    Precautions / Restrictions Precautions Precautions: Fall       ADL  Grooming: Minimal assistance Where Assessed - Grooming: Standing at sink Upper Body Bathing: Simulated;Minimal assistance Where Assessed - Upper Body Bathing: Sit to stand from chair Lower Body Bathing: Performed;Minimal assistance Where Assessed - Lower Body Bathing: Sit to stand from chair Upper Body Dressing: Simulated;Set up Where Assessed - Upper Body Dressing: Sitting, chair Lower Body Dressing: Performed;Minimal assistance Where Assessed - Lower Body Dressing: Sit to stand from chair Toilet Transfer: Performed;Minimal assistance Toilet Transfer Method: Ambulating Toilet Transfer Equipment: Comfort height toilet Toileting - Clothing Manipulation: Performed;Minimal assistance Where Assessed - Glass blower/designer Manipulation: Standing Toileting - Hygiene: Performed;Minimal assistance Where Assessed - Toileting Hygiene: Standing Tub/Shower Transfer: Not assessed Tub/Shower Transfer Method: Not assessed Ambulation Related to ADLs: Pt unsteady on feet.  Educated pt on safety and balance.    OT Goals ADL Goals ADL Goal: Grooming - Progress: Progressing toward goals ADL Goal: Toilet Transfer - Progress: Progressing toward goals ADL Goal: Toileting - Clothing Manipulation - Progress: Progressing toward goals ADL Goal: Toileting - Hygiene - Progress: Progressing toward goals     Subjective Data  Subjective: I just want to walk all i can      Cognition  Overall Cognitive Status: Appears within functional limits for tasks assessed/performed Arousal/Alertness:  Awake/alert Orientation Level: Appears intact for tasks assessed Behavior During Session: [facility] for tasks performed    Mobility Bed Mobility Bed Mobility: Not assessed Sit to Supine: 5: Supervision Transfers Transfers: Sit to Stand Sit to Stand: 5: Supervision Stand to Sit: 5: Supervision         End of Session OT - End of Session Activity Tolerance: Patient tolerated treatment well Patient left: in chair;with call bell/phone within reach   [facility], Karin Golden D 11/28/2011, 1:27 PM

## 2011-11-28 NOTE — Progress Notes (Signed)
Pt failed BERG balance test with a score of 42/56 which indicates VERY HIGH FALL RISK.  Since pt lives home alone and shows a significant physical decline with these past 2 admissions, agree with OT for for SNF for ST Rehab. Notified RN for Case Manager referral.  Pt agrees with ST Rehab and lived in [place].  She would prefer [facility]. Will discuss case with LPT. Felecia Shelling  PTA [facility]  Acute  Rehab Pager     [phone]

## 2011-11-28 NOTE — Progress Notes (Signed)
Physical medicine and rehabilitation consult requested. Chart has been reviewed as well as physical occupational therapy notes. Patient is currently minimal assist ambulate 300 feet without a device as well as supervision for transfers. Recommendations have been for home health therapies. I did speak with case manager in regards to this. Will hold on formal rehabilitation consult at this time with recommendations home with home health therapies

## 2011-11-28 NOTE — Progress Notes (Signed)
Patient ID: [name], female   DOB: 12-13-1928, 78 y.o.   MRN: [id number] [name] Gastroenterology Progress Note  Subjective: Doing well- eating soft diet without difficulty.  Stools are loose  -3 x yesterday, none today so far  Objective:  Vital signs in last 24 hours: Temp:  [97.9 F (36.6 C)-98.9 F (37.2 C)] 98.4 F (36.9 C) (04/29 0449) Pulse Rate:  [74-86] 74  (04/29 0449) Resp:  [18] 18  (04/29 0449) BP: (98-113)/(64-79) 98/64 mmHg (04/29 0449) SpO2:  [93 %-94 %] 93 % (04/29 0449) Last BM Date: 11/25/11 General:   Alert,  Well-developed,    in NAD Heart:  Regular rate and rhythm; no murmurs Pulm;clear Abdomen:  Soft, nontender and nondistended. Normal bowel sounds, without guarding, and without rebound.   Extremities:  Without edema. Neurologic:  Alert and  oriented x4;  grossly normal neurologically. Psych:  Alert and cooperative. Normal mood and affect.  Intake/Output from previous day: 04/28 0701 - 04/29 0700 In: -  Out: 600 [Urine:600] Intake/Output this shift: Total I/O In: -  Out: 240 [Urine:240]  Lab Results: No results found for this basename: WBC:3,HGB:3,HCT:3,PLT:3 in the last 72 hours BMET  Basename 11/28/11 0440 11/27/11 0528 11/26/11 0535  NA 137 138 137  K 3.5 3.5 3.2*  CL 102 103 101  CO2 29 29 31   GLUCOSE 93 88 95  BUN 3* 3* 4*  CREATININE 0.69 0.68 0.70  CALCIUM 8.6 8.5 9.2      Assessment / Plan: #1 stable and improving s/p pyloric stent placement  For functional gastric outlet obstruction. Pt is s/p remote esophagectomy and gastric pull-up for esophageal cancer She should stay on a soft diet Upright for all meals and for at least one hour after meals Continue reglan 10 mg ac ,Protonix 40 mg QD She has  A follow up appt with DrChristella [name] in our office on  May 15 th at 9:45 am Gi will sign off, hopefully she can be discharged in next few days Principal Problem:  *Vomiting Active Problems:  ANEMIA-IRON DEFICIENCY  HYPERTENSION  GERD  HX OF ESOPHAGEAL CANCER  Asthma with bronchitis  Esophageal stricture  Gastric outlet obstruction  Hypokalemia  Atrial fibrillation with rapid ventricular response  Constipation  Severe malnutrition     LOS: 11 days   [name]  11/28/2011, 9:34 AM

## 2011-11-28 NOTE — Progress Notes (Signed)
I have reviewed the above note, examined the patient and agree with plan of treatment. Pt's niece requests rehab consult and Physical therapy for progressive ambulation. Pt lives at home by herself and will not be able to take care of herself right away.

## 2011-11-29 ENCOUNTER — Encounter (INDEPENDENT_AMBULATORY_CARE_PROVIDER_SITE_OTHER): Payer: Self-pay | Admitting: Surgery

## 2011-11-29 LAB — GLUCOSE, CAPILLARY: Glucose-Capillary: 80 mg/dL (ref 70–99)

## 2011-11-29 LAB — BASIC METABOLIC PANEL
BUN: <3 mg/dL — ABNORMAL LOW (ref 6–23)
Creatinine, Ser: 0.74 mg/dL (ref 0.50–1.10)
GFR calc Af Amer: >90 mL/min (ref >90)
GFR calc non Af Amer: 78 mL/min — ABNORMAL LOW (ref >90)
Potassium: 3.8 mEq/L (ref 3.5–5.1)

## 2011-11-29 LAB — CBC
MCHC: 32 g/dL (ref 30.0–36.0)
RDW: 14.5 % (ref 11.5–15.5)

## 2011-11-29 MED ORDER — WARFARIN SODIUM 2.5 MG PO TABS
2.5000 mg | ORAL_TABLET | Freq: Once | ORAL | Status: AC
  Administered 2011-11-29: 2.5 mg via ORAL
  Filled 2011-11-29: qty 1

## 2011-11-29 MED ORDER — WARFARIN VIDEO
Freq: Once | Status: AC
  Administered 2011-11-29: 20:00:00

## 2011-11-29 MED ORDER — DIGOXIN 125 MCG PO TABS
0.1250 mg | ORAL_TABLET | Freq: Every day | ORAL | Status: DC
  Administered 2011-11-29 – 2011-11-30 (×2): 0.125 mg via ORAL
  Filled 2011-11-29 (×3): qty 1

## 2011-11-29 MED ORDER — COUMADIN BOOK
Freq: Once | Status: DC
  Filled 2011-11-29: qty 1

## 2011-11-29 MED ORDER — WARFARIN - PHARMACIST DOSING INPATIENT: Freq: Every day | Status: DC

## 2011-11-29 NOTE — Progress Notes (Signed)
Occupational Therapy Treatment Patient Details Name: [name] MRN: [id number] DOB: 03/24/1929 Today's Date: 11/29/2011 Time: 8657-8469 OT Time Calculation (min): 11 min  OT Assessment / Plan / Recommendation Comments on Treatment Session aware she is not able to DC home I ly    Follow Up Recommendations  Skilled nursing facility    Equipment Recommendations  Defer to next venue    Frequency Min 2X/week   Plan Discharge plan remains appropriate    Precautions / Restrictions Precautions Precautions: Fall Precaution Comments: Encouraged pt to walk with wide BOS.   Pertinent Vitals/Pain No pain    ADL  Grooming: Performed Where Assessed - Grooming: Standing at sink;Other (comment) (pt stood for 8 minutes) Upper Body Bathing: Minimal assistance Toilet Transfer: Minimal assistance Toilet Transfer Method: Ambulating Toilet Transfer Equipment: Regular height toilet;Grab bars Toileting - Clothing Manipulation: Performed;Supervision/safety Where Assessed - Glass blower/designer Manipulation: Standing Toileting - Hygiene: Performed;Supervision/safety Where Assessed - Toileting Hygiene: Standing Ambulation Related to ADLs: Pt walker with narrow BOS. Educated on importance of wide BOS    OT Goals ADL Goals ADL Goal: Grooming - Progress: Progressing toward goals ADL Goal: Statistician - Progress: Progressing toward goals ADL Goal: Toileting - Clothing Manipulation - Progress: Progressing toward goals ADL Goal: Toileting - Hygiene - Progress: Progressing toward goals     Subjective Data  Subjective: I know my balance is a little off - I want to go to rehab for a bit      Cognition  Overall Cognitive Status: Appears within functional limits for tasks assessed/performed Arousal/Alertness: Awake/alert Orientation Level: Appears intact for tasks assessed Behavior During Session: [facility] for tasks performed    Mobility Transfers Transfers: Sit to Stand;Stand to  Sit Transfers: Yes Sit to Stand: 4: Min guard Stand to Sit: 4: Min guard         End of Session OT - End of Session Activity Tolerance: Patient tolerated treatment well Patient left: in chair;with call bell/phone within reach;with nursing in room   [facility], Akansha Wyche D 11/29/2011, 11:47 AM

## 2011-11-29 NOTE — Progress Notes (Signed)
CSW met with patient. Patient requesting SNF in any of the facilities in [place]. FL2 completed and placed on chart. Patient faxed to [facility] facilities. Will follow.  Vallerie Hentz C. Arney Mayabb MSW, LCSW [phone]

## 2011-11-29 NOTE — Progress Notes (Signed)
ANTICOAGULATION CONSULT NOTE - Initial Consult  Pharmacy Consult for warfarin Indication: atrial fibrillation  Allergies  Allergen Reactions  . Erythromycin Ethylsuccinate Other (See Comments)    REACTION: unspecified  . Prednisone Other (See Comments)    REACTION: unspecified    Patient Measurements: Height: 5\' 3"  (160 cm) Weight: 127 lb (57.607 kg) IBW/kg (Calculated) : 52.4   Vital Signs: Temp: 98.3 F (36.8 C) (04/30 0511) Temp src: Oral (04/30 0511) BP: 115/80 mmHg (04/30 0511) Pulse Rate: 54  (04/30 0511)  Labs:  Basename 11/29/11 0455 11/28/11 0440 11/27/11 0528  HGB 11.2* -- --  HCT 35.0* -- --  PLT 204 -- --  APTT -- -- --  LABPROT -- -- --  INR -- -- --  HEPARINUNFRC -- -- --  CREATININE 0.74 0.69 0.68  CKTOTAL -- -- --  CKMB -- -- --  TROPONINI -- -- --   Estimated Creatinine Clearance: 46.4 ml/min (by C-G formula based on Cr of 0.74).  Medical History: Past Medical History  Diagnosis Date  . GERD (gastroesophageal reflux disease)   . Osteoporosis   . Postmenopausal HRT (hormone replacement therapy)   . Anemia   . Hypertension   . Allergy   . Asthma   . Shortness of breath   . PONV (postoperative nausea and vomiting)   . Pneumonia      Hx of pneumonia,32months ago  . Esophageal cancer     removed esophagus stomach pulled up    Medications:  Scheduled:    . antiseptic oral rinse  15 mL Mouth Rinse BID  . bisoprolol  2.5 mg Oral Daily  . calcium-vitamin D  1 tablet Oral TID WC  . digoxin  0.125 mg Intravenous Daily  . Hemocyte Plus  106 mg Oral BID  . levofloxacin (LEVAQUIN) IV  750 mg Intravenous Q48H  . loratadine  5 mg Oral QPM  . magic mouthwash w/lidocaine  5 mL Oral TID  . metoCLOPramide  5 mg Oral TID AC  . pantoprazole  40 mg Oral Q1200  . potassium chloride  40 mEq Oral Once  . sucralfate  1 g Oral BID AC  . Vitamin D (Ergocalciferol)  50,000 Units Oral Q7 days   Infusions:    . dextrose 5 % and 0.45% NaCl 50 mL/hr at  11/28/11 1445    Assessment:  78 yo female s/p duodenal stent placement 4/25 in Afib now deemed safe to start warfarin for anticoagulation per pharmacy dosing due to CHADS2 score of 2.   Baseline INR 1.1 this admission  Note patient on Levaquin x 1 more day  Goal of Therapy:  INR 2-3   Plan:  1. Due to age of 78 yo, will need to start warfarin dosing more conservatively and give 2.5mg  tonight 2. Daily INR 3. Send warfarin education booklet and order warfarin education video 4. Also note that with patient tolerating PO well now, will change digoxin to PO - Will order a digoxin level to be drawn tomorrow AM to give an idea of where we stand   Hessie Knows, PharmD, BCPS Pager [phone] 11/29/2011 9:15 AM

## 2011-11-29 NOTE — Progress Notes (Signed)
Subjective: Patient feels better. Able to tolerate diet though not eating to fullest extent of diet allowed. Denies any pain. Loose stools persist especially immediately after meal intake.  Objective: Vital signs Filed Vitals:   11/28/11 1500 11/28/11 2130 11/29/11 0511 11/29/11 1342  BP: 111/78 136/87 115/80 110/71  Pulse: 70 77 54 77  Temp: 98.1 F (36.7 C) 98.4 F (36.9 C) 98.3 F (36.8 C) 98.3 F (36.8 C)  TempSrc:  Oral Oral Oral  Resp: 14 16 16 18   Height:      Weight:   57.607 kg (127 lb)   SpO2: 94% 93% 94% 94%   Weight change:  Last BM Date: 11/28/11  Intake/Output from previous day: 04/29 0701 - 04/30 0700 In: 1840 [P.O.:840; I.V.:1000] Out: 1340 [Urine:1340]     Physical Exam: General: Alert, awake, oriented x3, in no acute distress. Up in chair.  HEENT: No bruits, no goiter. Mucus membranes mouth moist/pink Heart: Regular rate and rhythm, without murmurs, rubs, gallops. Lungs:  Normal effort. Breath sounds clear to auscultation bilaterally. No wheeze, rhonchi Abdomen: Soft, nontender, nondistended, positive bowel sounds. Extremities: No clubbing cyanosis or edema with positive pedal pulses. Neuro: Grossly intact, nonfocal. Speech clear. Voice improving but still raspy.     Lab Results: Basic Metabolic Panel:  Basename 11/29/11 0455 11/28/11 0440 11/27/11 0528  NA 139 137 --  K 3.8 3.5 --  CL 106 102 --  CO2 30 29 --  GLUCOSE 91 93 --  BUN <3* 3* --  CREATININE 0.74 0.69 --  CALCIUM 8.9 8.6 --  MG -- -- --  PHOS -- -- 2.4   Liver Function Tests: No results found for this basename: AST:2,ALT:2,ALKPHOS:2,BILITOT:2,PROT:2,ALBUMIN:2 in the last 72 hours No results found for this basename: LIPASE:2,AMYLASE:2 in the last 72 hours No results found for this basename: AMMONIA:2 in the last 72 hours CBC:  Basename 11/29/11 0455  WBC 5.4  NEUTROABS --  HGB 11.2*  HCT 35.0*  MCV 86.6  PLT 204   Cardiac Enzymes: No results found for this basename:  CKTOTAL:3,CKMB:3,CKMBINDEX:3,TROPONINI:3 in the last 72 hours BNP: No results found for this basename: PROBNP:3 in the last 72 hours D-Dimer: No results found for this basename: DDIMER:2 in the last 72 hours CBG:  Basename 11/29/11 0740 11/28/11 0845 11/27/11 0726  GLUCAP 80 91 82   Hemoglobin A1C: No results found for this basename: HGBA1C in the last 72 hours Fasting Lipid Panel: No results found for this basename: CHOL,HDL,LDLCALC,TRIG,CHOLHDL,LDLDIRECT in the last 72 hours Thyroid Function Tests: No results found for this basename: TSH,T4TOTAL,FREET4,T3FREE,THYROIDAB in the last 72 hours Anemia Panel: No results found for this basename: VITAMINB12,FOLATE,FERRITIN,TIBC,IRON,RETICCTPCT in the last 72 hours Coagulation: No results found for this basename: LABPROT:2,INR:2 in the last 72 hours Urine Drug Screen: Drugs of Abuse  No results found for this basename: labopia,  cocainscrnur,  labbenz,  amphetmu,  thcu,  labbarb    Alcohol Level: No results found for this basename: ETH:2 in the last 72 hours Urinalysis: No results found for this basename: COLORURINE:2,APPERANCEUR:2,LABSPEC:2,PHURINE:2,GLUCOSEU:2,HGBUR:2,BILIRUBINUR:2,KETONESUR:2,PROTEINUR:2,UROBILINOGEN:2,NITRITE:2,LEUKOCYTESUR:2 in the last 72 hours Misc. Labs:  No results found for this or any previous visit (from the past 240 hour(s)).  Studies/Results: No results found.  2D ECHOCARDIOGRAM Study Conclusions  - Left ventricle: The cavity size was normal. There was mild concentric hypertrophy. Systolic function was normal. The estimated ejection fraction was in the range of 55% to 60%. Wall motion was normal; there were no regional wall motion abnormalities. Features are consistent with a pseudonormal  left ventricular filling pattern, with concomitant abnormal relaxation and increased filling pressure (grade 2 diastolic dysfunction). - Aortic valve: Moderate diffuse thickening and calcification. Moderate  regurgitation. - Mitral valve: Mild regurgitation. - Pulmonary arteries: PA peak pressure: 36mm Hg (S). - The right ventricular systolic pressure was increased consistent with mild pulmonary hypertension.    Medications: Scheduled Meds:    . antiseptic oral rinse  15 mL Mouth Rinse BID  . bisoprolol  2.5 mg Oral Daily  . calcium-vitamin D  1 tablet Oral TID WC  . coumadin book   Does not apply Once  . digoxin  0.125 mg Oral Daily  . Hemocyte Plus  106 mg Oral BID  . levofloxacin (LEVAQUIN) IV  750 mg Intravenous Q48H  . loratadine  5 mg Oral QPM  . magic mouthwash w/lidocaine  5 mL Oral TID  . metoCLOPramide  5 mg Oral TID AC  . pantoprazole  40 mg Oral Q1200  . potassium chloride  40 mEq Oral Once  . sucralfate  1 g Oral BID AC  . Vitamin D (Ergocalciferol)  50,000 Units Oral Q7 days  . warfarin  2.5 mg Oral ONCE-1800  . warfarin   Does not apply Once  . Warfarin - Pharmacist Dosing Inpatient   Does not apply q1800  . DISCONTD: digoxin  0.125 mg Intravenous Daily   Continuous Infusions:    . dextrose 5 % and 0.45% NaCl 50 mL/hr at 11/28/11 1445   PRN Meds:.acetaminophen, acetaminophen, albuterol, albuterol, LORazepam, morphine injection, ondansetron, phenol, promethazine  Assessment/Plan:  Principal Problem:  *Vomiting Active Problems:  ANEMIA-IRON DEFICIENCY  HYPERTENSION  GERD  HX OF ESOPHAGEAL CANCER  Asthma with bronchitis  Esophageal stricture  Gastric outlet obstruction  Hypokalemia  Atrial fibrillation with rapid ventricular response  Constipation  Severe malnutrition   Vomiting secondary to multifactorial causes including: Esophageal stricture and Gastric outlet obstruction in a patient with a history of esophageal cancer status post surgical resection, gastroparesis, volvulus.  Status post EGD 11/24/11 with duodenal stent placement. Seems to be doing well and tolerating soft diet. Will stay on soft diet per GI rec. Will continue reglan and protonix per  GI. Appreciate GI assistance.   Gastroenterology consultation performed by Dr. Marina Goodell on 11/17/2011.  Status post EGD 10/19/2011 showed her anastomosis to be at 17 cm with stenosis at the anastomosis, which was dilated.  EGD at that time also showed gastritis with biopsies negative for H. Pylori.  Hospitalized 11/05/2011-11/10/2011 for what was thought to be a gastroparesis flare. An upper GI series was suspicious for delayed gastric emptying and an EGD failed to reveal any evidence of gastric outlet obstruction. She responded to NG tube decompression.  Zofran and Phenergan ordered on admission for symptomatic treatment.  On Reglan chronically to address gastroparesis.  Dr. Gerrit Friends consulted on 11/18/2011 per Dr. Lamar Sprinkles recommendations and no surgical intervention recommended.  Further evaluation done by Dr. Tyrone Sage on 11/21/2011 with recommendations for consideration of balloon dilatation of the pyloroplasty versus placement of a jejunostomy tube with enteral tube feedings.  Diet advanced to regular on 11/19/2011, but could not tolerate this for a long.  NG tube for gastric decompression ordered by the gastroenterologists on 11/21/2011.  EGD performed on 11/22/2011 which showed a very tortuous and collapsed pylorus and proximal small bowel. Functional obstruction was noted. Duodenal stent encompassing the pylorus and her proximal small bowel recommended and delayed 4/24 secondary to HR. Re-scheduled for today as HR controlled  S/P duodenal stent placement 11/24/11.  Diarrhea   Stool is loose but not as frequent. Reports after meals.  May have something to do with reintroduction of diet or due to antibiotics.  Have stopped stool softeners and laxatives. Will monitor  Aspiration pneumonia  The patient was found to be hypoxic on 11/22/2011.  A chest x-ray done on 11/22/2011 showed perihilar infiltrates concerning for pulmonary edema.  Patient was put on broad-spectrum antibiotics as noted below, for treatment  of presumed aspiration pneumonia. Cefepime and levaquin day for 4 days. Cefepime discontinued 4/27. Levaquin dosing adjusted for renal function. One more dose due 11/30/11. Sats 91-93 on Room air. Afebrile, non-toxic appearing  CXR done 11/27/11 reveals persistent consolidation on Left. Will not alter management as she is feeling better. Repeat CXR in 4-6 weeks.  Unfortunately, no cultures were done.  Sore Throat  Probably due to EGD. Continue with Chloraseptic spray.   ANEMIA-IRON DEFICIENCY  Hemoglobin 11.8-14.1, stable.  HYPERTENSION  Blood pressure stable  Continue bisoprolol SBP range 98-110  GERD  Continue Prevacid.  HX OF ESOPHAGEAL CANCER  No evidence of recurrence.  Asthma with bronchitis  Stable. albuterol HFA prn  New Atrial fibrillation (This admission)  Nursing staff reported an irregular and rapid heart rate on 11/23/2011.  12-lead EKG confirmed atrial fibrillation with rapid ventricular response, ventricular rate 154 beats per minute  Rate controlled. HR range 66-84. Will continue dig. Not on anticoagulation.   ECHO results as above. CHADS2 score is 2. Discussed with patient regarding stroke prevention. She is agreeable to Warfarin after explanation of risks and benefits. Will initiate. She will need follow up with [facility]ology.  Hypokalemia  Improved. Secondary to GI losses.  Try to keep greater than 4. Will replete and recheck in am  Hypophosphoremia  Repleted.   Severe malnutrition  Related to intractable nausea and vomiting.  Seen by dietitian on 11/18/2011.  BMI 20.69.  See #1. Tolerating D3 Diet.   Dispo: Lives alone. Will likely need SNF before going home. Will request SW consult. Hopefully 24-48 hours.   Patient and her niece had multiple questions all of which were answered.    LOS: 12 days   Eleni Frank 11/29/2011, 3:38 PM

## 2011-11-29 NOTE — Clinical Social Work Psychosocial (Unsigned)
     Clinical Social Work Department BRIEF PSYCHOSOCIAL ASSESSMENT 11/29/2011  Patient:  QUINTELLA, MURA     Account Number:  [id number]     Admit date:  11/17/2011  Clinical Social Worker:  Hattie Perch  Date/Time:  11/29/2011 12:00 M  Referred by:  Physician  Date Referred:  11/29/2011 Referred for  SNF Placement   Other Referral:   Interview type:  Patient Other interview type:    PSYCHOSOCIAL DATA Living Status:  ALONE Admitted from facility:   Level of care:   Primary support name:  Laurey Morale Primary support relationship to patient:  FRIEND Degree of support available:   good    CURRENT CONCERNS Current Concerns  Post-Acute Placement   Other Concerns:    SOCIAL WORK ASSESSMENT / PLAN patient is alert and oriented and requesting SNF. patient would like a facility in [place].   Assessment/plan status:   Other assessment/ plan:   Information/referral to community resources:    PATIENTS/FAMILYS RESPONSE TO PLAN OF CARE: patient agreeable to SNF upon discharge.

## 2011-11-29 NOTE — Plan of Care (Signed)
Problem: Food- and Nutrition-Related Knowledge Deficit (NB-1.1) Goal: Nutrition education Formal process to instruct or train a patient/client in a skill or to impart knowledge to help patients/clients voluntarily manage or modify food choices and eating behavior to maintain or improve health.  Outcome: Completed/Met Date Met:  11/29/11 Knowledge deficit resolved with diet education on 4/30.  Comments:  Discussed and provided handouts obtained from ADA nutrition care manual, "Dysphagia 3 diet,"Dysphagia Liquids," and "GERD Nutrition Therapy." Patient eager to listen to RD. Patient had good in-put and was without any further nutrition related questions. She verbalized understanding of nutrition information provided. Expect good compliance.   RD available for nutrition needs.  Iven Finn [facility] [phone]

## 2011-11-30 DIAGNOSIS — E41 Nutritional marasmus: Secondary | ICD-10-CM

## 2011-11-30 DIAGNOSIS — I4891 Unspecified atrial fibrillation: Secondary | ICD-10-CM

## 2011-11-30 DIAGNOSIS — R112 Nausea with vomiting, unspecified: Secondary | ICD-10-CM

## 2011-11-30 DIAGNOSIS — J690 Pneumonitis due to inhalation of food and vomit: Secondary | ICD-10-CM

## 2011-11-30 LAB — PROTIME-INR: Prothrombin Time: 14.1 seconds (ref 11.6–15.2)

## 2011-11-30 MED ORDER — DIGOXIN 125 MCG PO TABS: 0.1250 mg | ORAL_TABLET | Freq: Every day | ORAL | Status: DC

## 2011-11-30 MED ORDER — LORATADINE 10 MG PO TABS: 5.0000 mg | ORAL_TABLET | Freq: Every evening | ORAL | Status: DC

## 2011-11-30 MED ORDER — WARFARIN SODIUM 2.5 MG PO TABS: ORAL_TABLET | ORAL | Status: DC

## 2011-11-30 MED ORDER — PHENOL 1.4 % MT LIQD: 2.0000 | OROMUCOSAL | Status: DC | PRN

## 2011-11-30 MED ORDER — LEVOFLOXACIN 750 MG PO TABS
750.0000 mg | ORAL_TABLET | Freq: Once | ORAL | Status: DC
  Filled 2011-11-30: qty 1

## 2011-11-30 MED ORDER — ACETAMINOPHEN 325 MG PO TABS: 650.0000 mg | ORAL_TABLET | Freq: Four times a day (QID) | ORAL | Status: AC | PRN

## 2011-11-30 MED ORDER — PANTOPRAZOLE SODIUM 40 MG PO TBEC: 40.0000 mg | DELAYED_RELEASE_TABLET | Freq: Every day | ORAL | Status: DC

## 2011-11-30 MED ORDER — BISOPROLOL FUMARATE 5 MG PO TABS: 2.5000 mg | ORAL_TABLET | Freq: Every day | ORAL | Status: DC

## 2011-11-30 MED ORDER — WARFARIN SODIUM 2.5 MG PO TABS
2.5000 mg | ORAL_TABLET | Freq: Every day | ORAL | Status: DC
  Administered 2011-11-30: 2.5 mg via ORAL
  Filled 2011-11-30 (×2): qty 1

## 2011-11-30 MED ORDER — BIOTENE DRY MOUTH MT LIQD: 15.0000 mL | Freq: Two times a day (BID) | OROMUCOSAL | Status: DC

## 2011-11-30 MED ORDER — MAGIC MOUTHWASH W/LIDOCAINE: 5.0000 mL | Freq: Three times a day (TID) | ORAL | Status: DC

## 2011-11-30 NOTE — Discharge Summary (Addendum)
Physician Discharge Summary  Patient ID: ROSELLEN LICHTENBERGER MRN: [id number] DOB/AGE: 1929-06-24 78 y.o.  Admit date: 11/17/2011 Discharge date: 11/30/2011  Primary Care Physician:  Carrie Mew, MD, MD   Discharge Diagnoses:  Vomiting secondary to multifactorial causes including: Esophageal stricture and Gastric outlet obstruction in a patient with a history of esophageal cancer status post surgical resection, gastroparesis, volvulus.    Active Problems:  ANEMIA-IRON DEFICIENCY  HYPERTENSION  GERD  HX OF ESOPHAGEAL CANCER  Asthma with bronchitis  Esophageal stricture  Gastric outlet obstruction  Atrial fibrillation with rapid ventricular response  Severe malnutrition   Medication List  As of 11/30/2011 11:10 AM   STOP taking these medications         bisoprolol-hydrochlorothiazide 2.5-6.25 MG per tablet      ondansetron 4 MG tablet      promethazine 25 MG suppository         TAKE these medications         acetaminophen 325 MG tablet   Commonly known as: TYLENOL   Take 2 tablets (650 mg total) by mouth every 6 (six) hours as needed (or Fever >/= 101).      albuterol 108 (90 BASE) MCG/ACT inhaler   Commonly known as: PROVENTIL HFA;VENTOLIN HFA   Inhale 2 puffs into the lungs every 6 (six) hours as needed. For wheezing      antiseptic oral rinse Liqd   15 mLs by Mouth Rinse route 2 (two) times daily.      bisoprolol 5 MG tablet   Commonly known as: ZEBETA   Take 0.5 tablets (2.5 mg total) by mouth daily.      digoxin 0.125 MG tablet   Commonly known as: LANOXIN   Take 1 tablet (0.125 mg total) by mouth daily.      feeding supplement Liqd   Take 1 Container by mouth 2 (two) times daily between meals.      Hemocyte Plus 106-1 MG Caps   Take 106 mg by mouth 2 (two) times daily.      lansoprazole 30 MG disintegrating tablet   Commonly known as: PREVACID SOLUTAB   Take 2 tablets (60 mg total) by mouth 2 (two) times daily.      levocetirizine 5 MG tablet   Commonly known as: XYZAL   Take 5 mg by mouth every evening. Patient takes only as needed      loratadine 10 MG tablet   Commonly known as: CLARITIN   Take 0.5 tablets (5 mg total) by mouth every evening.      magic mouthwash w/lidocaine Soln   Take 5 mLs by mouth 3 (three) times daily.      metoCLOPramide 5 MG tablet   Commonly known as: REGLAN   Take 1 tablet (5 mg total) by mouth 3 (three) times daily before meals.      pantoprazole 40 MG tablet   Commonly known as: PROTONIX   Take 1 tablet (40 mg total) by mouth daily at 12 noon.      phenol 1.4 % Liqd   Commonly known as: CHLORASEPTIC   Use as directed 2 sprays in the mouth or throat as needed.      sucralfate 1 GM/10ML suspension   Commonly known as: CARAFATE   Take 10 mLs (1 g total) by mouth 2 (two) times daily before a meal.      VIACTIV 500-100-40 MG-UNT-MCG Chew   Generic drug: Calcium-Vitamin D-Vitamin K   Chew by mouth 3 (three) times daily.  Vitamin D (Ergocalciferol) 50000 UNITS Caps   Commonly known as: DRISDOL   Take 50,000 Units by mouth every 7 (seven) days. On Friday      warfarin 2.5 MG tablet   Commonly known as: COUMADIN   Take 2.5mg  daily until 12/02/11 at which time INR needs to be checked and dosing adjusted accordingly.      zolpidem 5 MG tablet   Commonly known as: AMBIEN   Take 1 tablet (5 mg total) by mouth at bedtime as needed for sleep.             Disposition and Follow-up: pt medically stable and ready for discharge to facility. Has apt. With GI 5/15. Will need PCP appointment 1 week . Will need coumadin dosed and INR checked 5/13.   Consults:  GI , Surgery  Physical Exam: General: Alert, awake, oriented x3, in no acute distress. Up in chair.  HEENT: No bruits, no goiter. Mucus membranes mouth moist/pink  Heart: Regular rate and rhythm, without murmurs, rubs, gallops.  Lungs: Normal effort. Breath sounds clear to auscultation bilaterally. No wheeze, rhonchi  Abdomen: Soft,  nontender, nondistended, positive bowel sounds.  Extremities: No clubbing cyanosis or edema with positive pedal pulses.  Neuro: Grossly intact, nonfocal. Speech clear. Voice improving but still raspy.   Labs:  Vital signs in last 24 hours:   11/29/11 1342 11/29/11 2100 11/30/11 0505  BP: 110/71 129/85 114/77  Pulse: 77 69 63  Temp: 98.3 F (36.8 C) 98.1 F (36.7 C) 97.9 F (36.6 C)  SpO2: 94% 95% 95%    Lab 11/29/11 0455 11/25/11 0441 11/24/11 0512  WBC 5.4 5.6 6.2  HGB 11.2* 12.4 11.5*  HCT 35.0* 38.8 36.9  PLT 204 160 153    Lab 11/29/11 0455 11/28/11 0440 11/27/11 0528 11/26/11 0535 11/25/11 0441  NA 139 137 138 137 137  K 3.8 3.5 3.5 3.2* 3.3*  CL 106 102 103 101 101  CO2 30 29 29 31 30   GLUCOSE 91 93 88 95 93  BUN <3* 3* 3* 4* 7  CREATININE 0.74 0.69 0.68 0.70 0.66  CALCIUM 8.9 8.6 8.5 9.2 9.0  MG -- -- -- 1.6 1.5    Significant Diagnostic Studies:   Procedure(s): ESOPHAGOGASTRODUODENOSCOPY (EGD) DUODENAL STENT PLACEMENT   Dg Chest 1 View 11/05/2011   IMPRESSION:   1.  Nasogastric tube is in the lower part of the intrathoracic stomach, and has successfully decompressed the gastric pull- through.  2.  Mild atelectasis in both lung bases.   11/24/2011    IMPRESSION:  Vertically oriented the stent deployed in the region of the duodenum in this patient with previous esophagectomy and gastric pull-through.    Dg Chest Port 1 View 11/26/2011    IMPRESSION:  Increased consolidation of the left lower lobe with probable component of left pleural fluid.  Atelectasis at the right lung base with a smaller right pleural effusion.    Dg Chest Port 1 View 11/22/2011  IMPRESSION:  Cardiac enlargement with increasing pulmonary vascular congestion and interstitial and perihilar edema.  Enteric tube coiled in the mediastinum, likely within the intrathoracic stomach.    Dg Abd Acute W/chest 11/17/2011    IMPRESSION:  Constipation.    Dg Abd Portable 1v 11/18/2011     IMPRESSION:  Moderate stool in the rectosigmoid colon.    Dg Ugi W/water Sol 11/25/2011   IMPRESSION:   1.  The duodenal stent is patent.  2.  There is only drainage through the stent  with the patient in the erect position.  No extravasation is identified.      Brief H and P: For complete details please refer to admission H and P, but in brief   Patient is a pleasant 78 year old female with multiple medical comorbidities including but not limited to esophageal cancer of mid to proximal region status post resection, hypertension, asthma, esophageal strictures who presented to ED on 4/18 with complaints of intractable nausea and vomiting that has been going on for previous 2-3 weeks prior to the admission. Patient had an admission in the past 2-3 months ago for similar problem at which time she had a gastric outlet obstruction and required NG tube. At this time patient has no associated abdominal pain, no diarrhea no blood in the stool or urine. Patient did complain of constipation. No complaints of dysuria, hematuria. No complaints of chest pain, cough or shortness of breath. No complaints of fever or chills. She was admitted by medicine service.   Hospital Course:   Vomiting secondary to multifactorial causes including: Esophageal stricture and Gastric outlet obstruction in a patient with a history of esophageal cancer status post surgical resection, gastroparesis, volvulus.  Status post EGD 11/24/11 with duodenal stent placement. Seems to be doing well and tolerating soft diet. Will stay on soft diet per GI rec. Will continue reglan and protonix per GI. Appreciate GI assistance.  Gastroenterology consultation performed by Dr. Marina Goodell on 11/17/2011.  Status post EGD 10/19/2011 showed her anastomosis to be at 17 cm with stenosis at the anastomosis, which was dilated.  EGD at that time also showed gastritis with biopsies negative for H. Pylori.  Hospitalized 11/05/2011-11/10/2011 for what was thought to  be a gastroparesis flare. An upper GI series was suspicious for delayed gastric emptying and an EGD failed to reveal any evidence of gastric outlet obstruction. She responded to NG tube decompression.  Zofran and Phenergan ordered on admission for symptomatic treatment.  On Reglan chronically to address gastroparesis.  Dr. Gerrit Friends consulted on 11/18/2011 per Dr. Lamar Sprinkles recommendations and no surgical intervention recommended.  Further evaluation done by Dr. Tyrone Sage on 11/21/2011 with recommendations for consideration of balloon dilatation of the pyloroplasty versus placement of a jejunostomy tube with enteral tube feedings.  Diet advanced to regular on 11/19/2011, but could not tolerate this for a long.  NG tube for gastric decompression ordered by the gastroenterologists on 11/21/2011.  EGD performed on 11/22/2011 which showed a very tortuous and collapsed pylorus and proximal small bowel. Functional obstruction was noted. Duodenal stent encompassing the pylorus and her proximal small bowel recommended and delayed 4/24 secondary to HR. Re-scheduled for today as HR controlled  S/P duodenal stent placement 11/24/11.   Diarrhea   Stool is loose but not as frequent. Reports after meals. May have something to do with reintroduction of diet or due to antibiotics. Much improved at discharge. Have stopped stool softeners and laxatives.    Aspiration pneumonia  The patient was found to be hypoxic on 11/22/2011.  A chest x-ray done on 11/22/2011 showed perihilar infiltrates concerning for pulmonary edema.  Patient was put on broad-spectrum antibiotics as noted below, for treatment of presumed aspiration pneumonia. Cefepime and levaquin day for 4 days. Cefepime discontinued 4/27. Levaquin dosing adjusted for renal function. One more dose due 11/30/11. This will be given before discharge Sats 91-93 on Room air. Afebrile, non-toxic appearing  CXR done 11/27/11 reveals persistent consolidation on Left. Will not alter  management as she is feeling better. Repeat CXR in 4-6  weeks.  Unfortunately, no cultures were done.  Sore Throat   Probably due to EGD. Much improved at discharge Continue with Chloraseptic spray.   ANEMIA-IRON DEFICIENCY  Hemoglobin 11.8-14.1, stable  HYPERTENSION  Blood pressure stable  Continue bisoprolol  SBP range 98-110  GERD  Continue Prevacid.  HX OF ESOPHAGEAL CANCER  No evidence of recurrence.  Asthma with bronchitis  Stable. albuterol HFA prn  New Atrial fibrillation (This admission)  Nursing staff reported an irregular and rapid heart rate on 11/23/2011.  12-lead EKG confirmed atrial fibrillation with rapid ventricular response, ventricular rate 154 beats per minute  Rate controlled. HR range 66-84. Will continue dig. Marland Kitchen  ECHO results as above. CHADS2 score is 2. Discussed with patient regarding stroke prevention. She is agreeable to Warfarin after explanation of risks and benefits. She will need follow up with [facility]. INR to be checked 12/02/11. Coumadin to be dosed accordingly .  Hypokalemia  Improved. Secondary to GI losses.  Try to keep greater than 4.At discharge potassium 3.8 Hypophosphoremia  Repleted.   Severe malnutrition  Related to intractable nausea and vomiting.  Seen by dietitian on 11/18/2011.  BMI 20.69.  See #1. Tolerating D3 Diet.    Time spent on Discharge: 40 minutes  Signed: Gwenyth Bender 11/30/2011, 11:10 AM  Pt was seen and examined at bedside. I have reviewed labs and vitals which are stable and pt is hemodynamically stable. I agree with the above's assessment and plan. Pt is stable for discharge.  Debbora Presto [facility], pager #: [phone] Main office number: [phone]

## 2011-11-30 NOTE — Progress Notes (Signed)
Physical Therapy Treatment Patient Details Name: [name] MRN: [id number] DOB: 1929/07/12 Today's Date: 11/30/2011 Time: 1400-1420 PT Time Calculation (min): 20 min  PT Assessment / Plan / Recommendation Comments on Treatment Session  Pt ready for d/c to SNF today.    Follow Up Recommendations  Skilled nursing facility    Equipment Recommendations  Defer to next venue    Frequency Min 3X/week   Plan Discharge plan remains appropriate;Frequency remains appropriate    Precautions / Restrictions Precautions Precautions: Fall Restrictions Weight Bearing Restrictions: No   Pertinent Vitals/Pain No c/o pain    Mobility  Bed Mobility Bed Mobility: Not assessed Transfers Transfers: Sit to Stand;Stand to Sit Sit to Stand: 6: Modified independent (Device/Increase time);From chair/3-in-1;From toilet;Without upper extremity assist Stand to Sit: 6: Modified independent (Device/Increase time);To toilet;To chair/3-in-1;Without upper extremity assist Details for Transfer Assistance: pt has progressed to mod I with transfers Ambulation/Gait Ambulation/Gait Assistance: 5: Supervision Ambulation Distance (Feet): 500 Feet Assistive device: None;Rolling walker Ambulation/Gait Assistance Details: used RW for first 250' and pt mod I, no AD for 2nd 250' and pt supervision do to increased sway but no LOB Gait Pattern: Step-through pattern;Left foot flat Gait velocity: WFL General Gait Details: pt ambulates at fast speed to decrease her time in single limb stance as her balance in SL stance is poor.  Made pt aware of this compensation. Stairs: No Wheelchair Mobility Wheelchair Mobility: No    Exercises General Exercises - Lower Extremity Mini-Sqauts: AROM;Strengthening;10 reps;Standing   PT Goals Acute Rehab PT Goals PT Goal Formulation: With patient Time For Goal Achievement: 12/02/11 Potential to Achieve Goals: Good Pt will go Sit to Stand: with modified independence PT Goal:  Sit to Stand - Progress: Met Pt will go Stand to Sit: with modified independence PT Goal: Stand to Sit - Progress: Met Pt will Ambulate: >150 feet;with modified independence;with least restrictive assistive device PT Goal: Ambulate - Progress: Progressing toward goal Pt will Go Up / Down Stairs: 3-5 stairs;with modified independence;with rail(s) PT Goal: Up/Down Stairs - Progress: Other (comment) (NT)  Visit Information  Last PT Received On: 11/30/11 Assistance Needed: +1    Subjective Data  Subjective: "I would love to walk before I leave" Patient Stated Goal: "I plan to stay at rehab only 7 days, that's my goal"   Cognition  Overall Cognitive Status: Appears within functional limits for tasks assessed/performed Arousal/Alertness: Awake/alert Orientation Level: Appears intact for tasks assessed Behavior During Session: [facility] for tasks performed    Balance  Balance Balance Assessed: Yes Dynamic Standing Balance Dynamic Standing - Balance Support: No upper extremity supported;During functional activity Dynamic Standing - Level of Assistance: 5: Stand by assistance Dynamic Standing - Balance Activities: Other (comment) Dynamic Standing - Comments: practiced single limb stance with unilateral UE support as well as rhomberg and tandem stance.  Left leg weakness apparent with gait pattern as well as balance activities in that she walks with left foot flat and has difficulty with left SL stance  End of Session PT - End of Session Equipment Utilized During Treatment: Gait belt Activity Tolerance: Patient tolerated treatment well Patient left: in chair;with call bell/phone within reach  [facility], PT  Acute Rehab Services  [phone]   Lyanne Co 11/30/2011, 2:30 PM

## 2011-11-30 NOTE — Progress Notes (Addendum)
CSW met with patient. Patient is agreeable to [facility] [employer]. [facility]. Packet copied and placed in [facility]. ptar called for transportation. CSW called Laure[facility] Morale, patients emergency contact, left voicemail informing of d/c.  Dameer Speiser C. Binta Statzer MSW, LCSW [phone]

## 2011-11-30 NOTE — Progress Notes (Signed)
ANTICOAGULATION CONSULT NOTE - Follow Up Consult  Pharmacy Consult for Warfarin and Digoxin Indication: atrial fibrillation  Allergies  Allergen Reactions  . Erythromycin Ethylsuccinate Other (See Comments)    REACTION: unspecified  . Prednisone Other (See Comments)    REACTION: unspecified    Patient Measurements: Height: 5\' 3"  (160 cm) Weight: 128 lb 1.6 oz (58.106 kg) IBW/kg (Calculated) : 52.4   Vital Signs: Temp: 97.9 F (36.6 C) (05/01 0505) Temp src: Oral (05/01 0505) BP: 114/77 mmHg (05/01 0505) Pulse Rate: 63  (05/01 0505)  Labs:  Basename 11/30/11 0500 11/29/11 0455 11/28/11 0440  HGB -- 11.2* --  HCT -- 35.0* --  PLT -- 204 --  APTT -- -- --  LABPROT 14.1 -- --  INR 1.07 -- --  HEPARINUNFRC -- -- --  CREATININE -- 0.74 0.69  CKTOTAL -- -- --  CKMB -- -- --  TROPONINI -- -- --   Estimated Creatinine Clearance: 46.4 ml/min (by C-G formula based on Cr of 0.74).   Medications:  Scheduled:    . antiseptic oral rinse  15 mL Mouth Rinse BID  . bisoprolol  2.5 mg Oral Daily  . calcium-vitamin D  1 tablet Oral TID WC  . coumadin book   Does not apply Once  . digoxin  0.125 mg Oral Daily  . Hemocyte Plus  106 mg Oral BID  . levofloxacin (LEVAQUIN) IV  750 mg Intravenous Q48H  . loratadine  5 mg Oral QPM  . magic mouthwash w/lidocaine  5 mL Oral TID  . metoCLOPramide  5 mg Oral TID AC  . pantoprazole  40 mg Oral Q1200  . sucralfate  1 g Oral BID AC  . Vitamin D (Ergocalciferol)  50,000 Units Oral Q7 days  . warfarin  2.5 mg Oral ONCE-1800  . warfarin   Does not apply Once  . Warfarin - Pharmacist Dosing Inpatient   Does not apply q1800  . DISCONTD: digoxin  0.125 mg Intravenous Daily   Infusions:    . dextrose 5 % and 0.45% NaCl 50 mL/hr at 11/29/11 1631   PRN: acetaminophen, acetaminophen, albuterol, albuterol, LORazepam, morphine injection, ondansetron, phenol, promethazine  Assessment: 78 yo female s/p duodenal stent placement 4/25 in Afib.  Started on warfarin 4/30. Conservative warfarin dosing being used currently due to age = 78 yo. Digoxin also being dosed per pharmacy for Afib. INR subtherapeutic as expected after only one dose of warfarin. No bleeding complications reported in chart. Drug interaction with Levaquin continues (possible elevation in INR)  Digoxin level therapeutic this am (0.9). Loading doses (0.25mg  IV x2 doses) given 4/24, started on Digoxin 0.125mg  IV daily on 4/25, then changed to PO digoxin 0.125mg  on 4/30. Will plan to continue this PO regimen.  Goal of Therapy:  INR 2-3 Digoxin level 0.5-2  Plan:  1) Repeat warfarin 2.5mg  PO at 18:00 2) Continue Digoxin 0.125mg  PO daily 3) If patient is discharged today, suggest sending her home on warfarin 2.5mg  PO daily with next INR check on Fri 12/02/11.  Annia Belt 11/30/2011,8:14 AM

## 2011-12-02 ENCOUNTER — Ambulatory Visit: Payer: Medicare Other | Admitting: Gastroenterology

## 2011-12-08 ENCOUNTER — Encounter: Payer: Self-pay | Admitting: Internal Medicine

## 2011-12-08 ENCOUNTER — Encounter: Payer: Self-pay | Admitting: Gastroenterology

## 2011-12-14 ENCOUNTER — Ambulatory Visit: Payer: Self-pay | Admitting: Gastroenterology

## 2011-12-14 ENCOUNTER — Encounter: Payer: Self-pay | Admitting: Gastroenterology

## 2011-12-14 ENCOUNTER — Ambulatory Visit (INDEPENDENT_AMBULATORY_CARE_PROVIDER_SITE_OTHER): Payer: Medicare Other | Admitting: Gastroenterology

## 2011-12-14 VITALS — BP 120/72 | HR 68 | Ht 63.0 in | Wt 119.6 lb

## 2011-12-14 DIAGNOSIS — Z8501 Personal history of malignant neoplasm of esophagus: Secondary | ICD-10-CM

## 2011-12-14 DIAGNOSIS — K311 Adult hypertrophic pyloric stenosis: Secondary | ICD-10-CM

## 2011-12-14 NOTE — Progress Notes (Signed)
Review of gastrointestinal problems:  1. Status post esophagectomy and gastric pull up for esophageal cancer, 1993.  2. Chronic GERD. Dyspepsia, likely much of it from #1. EGD January 2009 showed high anastomosis (18 cm from incisors), ring-like narrowing at the anastomosis, but non-adenomatous appearing. No esophagitis. No Barrett's changes. Needs high dose PPI twice daily to control her symptoms  3. adenomatous colon polyps, removed October 2005. November, 2010 colonoscopy found small adenomatous polyp. Was recommended against routine surveillance (would be 78 years old at time of next colonoscopy) 4. anatomic gastric outlet obstruction March 2013. Presented with episodes of severe vomiting it lasted 4 days. CAT scan and other imaging showed a very dilated fluid-filled stomach suggesting gastric outlet obstruction. After several tests, endoscopies it became fairly clear that her stomach was twisting or rotating around the pylorus. Probably this was a result of the fact that her entire stomach was in her chest.  Consultation with general surgery, chest surgery, it was eventually decided to place an enteral stent across her pylorus. 23 x 155 mm partially covered esophageal wall stent placed across pylorus (Dr. Arlyce Dice) 10/2011.  2 weeks following stent placement she had not had any severe episodes of vomiting.   HPI: This is a very pleasant 78 year old woman who is here with her sister and daughter today. She was hospitalized up until about 2-3 weeks ago and has been in a extended care facility since then. She is planning to go home in another week or 2.  Became nauseas last night, given phenergan and that helped.  DId not vomit.  Has not had any dramatic vomiting illnesses since d/c from hosp (2 weeks ago).   Eating soft foods and nutrition shakes.    Past Medical History  Diagnosis Date  . GERD (gastroesophageal reflux disease)   . Osteoporosis   . Postmenopausal HRT (hormone replacement therapy)    . Anemia   . Hypertension   . Allergy   . Asthma   . Shortness of breath   . PONV (postoperative nausea and vomiting)   . Pneumonia      Hx of pneumonia,10months ago  . Esophageal cancer     removed esophagus stomach pulled up    Past Surgical History  Procedure Date  . Left oophorectomy   . Esophageal removal of cancer,gastric ppull-thru 1993   . Cholecystectomy   . Rt arm fx   . Esophagogastroduodenoscopy 10/19/2011    Procedure: ESOPHAGOGASTRODUODENOSCOPY (EGD);  Surgeon: Rachael Fee, MD;  Location: [facility];  Service: Endoscopy;  Laterality: N/A;  . Balloon dilation 10/19/2011    Procedure: BALLOON DILATION;  Surgeon: Rachael Fee, MD;  Location: [facility];  Service: Endoscopy;  Laterality: N/A;  . Esophagogastroduodenoscopy 11/07/2011    Procedure: ESOPHAGOGASTRODUODENOSCOPY (EGD);  Surgeon: Meryl Dare, MD,FACG;  Location: [facility]OSCOPY;  Service: Endoscopy;  Laterality: N/A;  . Esophagogastroduodenoscopy 11/22/2011    Procedure: ESOPHAGOGASTRODUODENOSCOPY (EGD);  Surgeon: Louis Meckel, MD;  Location: [facility];  Service: Endoscopy;  Laterality: N/A;  . Esophagogastroduodenoscopy 11/24/2011    Procedure: ESOPHAGOGASTRODUODENOSCOPY (EGD);  Surgeon: Louis Meckel, MD;  Location: [facility];  Service: Endoscopy;  Laterality: N/A;  with removable duodenal stent (actually using esophageal partially covered 23X15 located in locked supply room  . Duodenal stent placement 11/24/2011    Procedure: DUODENAL STENT PLACEMENT;  Surgeon: Louis Meckel, MD;  Location: [facility]OS[facility];  Service: Endoscopy;  Laterality: N/A;    Current Outpatient Prescriptions  Medication Sig Dispense Refill  . acetaminophen (TYLENOL)  325 MG tablet Take 2 tablets (650 mg total) by mouth every 6 (six) hours as needed (or Fever >/= 101).      Marland Kitchen albuterol (PROVENTIL HFA;VENTOLIN HFA) 108 (90 BASE) MCG/ACT inhaler Inhale 2 puffs into the lungs every 6 (six) hours as needed. For wheezing        . Alum & Mag Hydroxide-Simeth (MAGIC MOUTHWASH W/LIDOCAINE) SOLN Take 5 mLs by mouth 3 (three) times daily.      Marland Kitchen antiseptic oral rinse (BIOTENE) LIQD 15 mLs by Mouth Rinse route 2 (two) times daily.      . B Complex-C-Min-Fe-FA (HEMOCYTE PLUS) 106-1 MG CAPS Take 106 mg by mouth 2 (two) times daily.  180 each  3  . bisoprolol (ZEBETA) 5 MG tablet Take 0.5 tablets (2.5 mg total) by mouth daily.      . Calcium-Vitamin D-Vitamin K (VIACTIV) 500-100-40 MG-UNT-MCG CHEW Chew by mouth 3 (three) times daily.        . digoxin (LANOXIN) 0.125 MG tablet Take 1 tablet (0.125 mg total) by mouth daily.      . feeding supplement (RESOURCE BREEZE) LIQD Take 1 Container by mouth 2 (two) times daily between meals.  240 mL  0  . lansoprazole (PREVACID SOLUTAB) 30 MG disintegrating tablet Take 2 tablets (60 mg total) by mouth 2 (two) times daily.  360 tablet  3  . levocetirizine (XYZAL) 5 MG tablet Take 5 mg by mouth every evening. Patient takes only as needed      . loratadine (CLARITIN) 10 MG tablet Take 0.5 tablets (5 mg total) by mouth every evening.      . metoCLOPramide (REGLAN) 5 MG tablet Take 1 tablet (5 mg total) by mouth 3 (three) times daily before meals.  180 tablet  3  . pantoprazole (PROTONIX) 40 MG tablet Take 1 tablet (40 mg total) by mouth daily at 12 noon.      . phenol (CHLORASEPTIC) 1.4 % LIQD Use as directed 2 sprays in the mouth or throat as needed.      . promethazine (PHENERGAN) 25 MG tablet Take 25 mg by mouth every 6 (six) hours as needed.      . sucralfate (CARAFATE) 1 GM/10ML suspension Take 10 mLs (1 g total) by mouth 2 (two) times daily before a meal.  3600 mL  3  . Vitamin D, Ergocalciferol, (DRISDOL) 50000 UNITS CAPS Take 50,000 Units by mouth every 7 (seven) days. On Friday      . warfarin (COUMADIN) 4 MG tablet Take 4 mg by mouth daily.      Marland Kitchen zolpidem (AMBIEN) 5 MG tablet Take 1 tablet (5 mg total) by mouth at bedtime as needed for sleep.  90 tablet  1    Allergies as of  12/14/2011 - Review Complete 12/14/2011  Allergen Reaction Noted  . Erythromycin ethylsuccinate Other (See Comments) 09/26/2006  . Prednisone Other (See Comments) 09/26/2006    Family History  Problem Relation Age of Onset  . Cervical cancer Mother   . Heart disease Father   . Coronary artery disease    . Hypotension Sister   . Colon cancer Neg Hx   . Colon polyps Sister   . Pancreatic cancer      1/2 sister    History   Social History  . Marital Status: Single    Spouse Name: N/A    Number of Children: 1  . Years of Education: N/A   Occupational History  . Retired    Social History  Main Topics  . Smoking status: Never Smoker   . Smokeless tobacco: Never Used  . Alcohol Use: No  . Drug Use: No  . Sexually Active: Yes   Other Topics Concern  . Not on file   Social History Narrative   DAILY CAFFEINE USE      Physical Exam: BP 120/72  Pulse 68  Ht 5\' 3"  (1.6 m)  Wt 119 lb 9.6 oz (54.25 kg)  BMI 21.19 kg/m2 Constitutional: Elderly, frail but alert and oriented and very pleasant Psychiatric: alert and oriented x3 Abdomen: soft, nontender, nondistended, no obvious ascites, no peritoneal signs, normal bowel sounds     Assessment and plan: 78 y.o. female with recently placed enteral stent across her pylorus for anatomic gastric outlet obstruction status post remote esophagectomy and gastric pull-through.  She has not had any severe episodes of vomiting and hopefully this is a good indication that the stent is functioning and doing what we hoped to do, that is keep her stomach from twisting or at least keep the lumen open enough to reduce the chance of gastric outlet obstruction if the stomach does again twist. Her surgical options are extremely limited. She will stay on soft foods, chew them as best as possible. She still has her usual chronic problems with severe GERD that she is continuing with for 20 years since her esophagectomy. She will return to see me in 4  weeks and sooner if needed I plan not to touch the stent unless it seems that it is causing her problems or is clearly not functioning the way we want it to

## 2011-12-14 NOTE — Patient Instructions (Signed)
Try twice daily carnation instant breakfast. If you have significant vomiting episode, call.   Soft foods, chew them well. Return to see Dr. Christella Hartigan in 4 weeks. Will not plan to change the removable, covered stent in your stomach unless it is causing problems.

## 2011-12-20 ENCOUNTER — Ambulatory Visit: Payer: Self-pay | Admitting: Gastroenterology

## 2011-12-20 ENCOUNTER — Telehealth: Payer: Self-pay | Admitting: Gastroenterology

## 2011-12-20 NOTE — Telephone Encounter (Signed)
The pt would like to speak with Amy personally about the stent that was placed.  She would also like to speak directly with Dr Christella Hartigan and Dr Arlyce Dice.  She does not feel like she was given enough answers regarding her condition.  I will forward to Amy, Dr Christella Hartigan and Dr Arlyce Dice.

## 2011-12-20 NOTE — Telephone Encounter (Signed)
I really think she needs to come in and talk to Dr. Christella Hartigan- please make her a soon appt

## 2011-12-21 NOTE — Telephone Encounter (Signed)
I have no idea where this is coming from at all.  But if she wants to switch care, this is absolutely OK with me.

## 2011-12-21 NOTE — Telephone Encounter (Signed)
i am happy to talk with her.  I saw her a week or two ago, thought I had answered all her questions but will obviously discuss again.

## 2011-12-21 NOTE — Telephone Encounter (Signed)
She is Dr. Larae Grooms patient.  I would be happy to speak with her further if she would like to speak with me directly.

## 2011-12-21 NOTE — Telephone Encounter (Signed)
Pt would like to switch care to Dr Arlyce Dice and says that Dr Christella Hartigan does not take her care seriously and feels like he doesn't care about her.  Is this ok?  She has appt with Dr Christella Hartigan and would like to cancel that if Dr Arlyce Dice will accept her as a pt.

## 2011-12-21 NOTE — Telephone Encounter (Signed)
That is ok with me.  [name] can you try talking with her again?

## 2011-12-21 NOTE — Telephone Encounter (Signed)
Since her observations are entirely unfounded I think it would be worthwhile having her see Dr. Christella Hartigan. I will see her only if she absolutely insists.

## 2011-12-22 ENCOUNTER — Telehealth: Payer: Self-pay | Admitting: *Deleted

## 2011-12-22 ENCOUNTER — Telehealth: Payer: Self-pay | Admitting: Gastroenterology

## 2011-12-22 NOTE — Telephone Encounter (Signed)
Spoke with pt and she states that she has already had an appt scheduled with Dr. Arlyce Dice. States she wants to discuss the stent that she has with Dr. Arlyce Dice. See previous phone note from 12/20/11.

## 2011-12-22 NOTE — Telephone Encounter (Signed)
The pt insist on seeing Dr Arlyce Dice continues to say that Dr Christella Hartigan does not care for about what happens to her because she is 78 years old.  Per Dr Arlyce Dice he would see her if she insist.  Appt made for 01/25/12 with Dr Arlyce Dice.

## 2011-12-22 NOTE — Telephone Encounter (Signed)
ok 

## 2011-12-22 NOTE — Telephone Encounter (Signed)
Home health nurse called- [facility]- drew pt's  inr which was 1.1- pt is new onset a fib and and is working on inr of around 2.  She is currently on 5mg  dailly with no other med----------------------------------------------------------------------------talked with dr Lovell Sheehan and he states t9o change to 10 mg for 2 days and then resume 5 mg daily and reecheck on 528-Tuesday- pt informed

## 2012-01-02 ENCOUNTER — Encounter: Payer: Self-pay | Admitting: Internal Medicine

## 2012-01-02 ENCOUNTER — Ambulatory Visit (INDEPENDENT_AMBULATORY_CARE_PROVIDER_SITE_OTHER): Payer: Medicare Other | Admitting: Internal Medicine

## 2012-01-02 VITALS — BP 110/64 | HR 64 | Temp 98.2°F | Resp 16 | Ht 63.0 in | Wt 117.0 lb

## 2012-01-02 DIAGNOSIS — D509 Iron deficiency anemia, unspecified: Secondary | ICD-10-CM

## 2012-01-02 DIAGNOSIS — I4891 Unspecified atrial fibrillation: Secondary | ICD-10-CM

## 2012-01-02 DIAGNOSIS — I10 Essential (primary) hypertension: Secondary | ICD-10-CM

## 2012-01-02 DIAGNOSIS — T887XXA Unspecified adverse effect of drug or medicament, initial encounter: Secondary | ICD-10-CM

## 2012-01-02 LAB — BASIC METABOLIC PANEL
BUN: 9 mg/dL (ref 6–23)
Chloride: 100 mEq/L (ref 96–112)
Creatinine, Ser: 0.6 mg/dL (ref 0.4–1.2)
GFR: 102.04 mL/min (ref >60.00)

## 2012-01-02 LAB — HEPATIC FUNCTION PANEL
ALT: 21 U/L (ref 0–35)
Albumin: 3 g/dL — ABNORMAL LOW (ref 3.5–5.2)
Alkaline Phosphatase: 70 U/L (ref 39–117)
Bilirubin, Direct: 0.1 mg/dL (ref 0.0–0.3)
Total Protein: 5.6 g/dL — ABNORMAL LOW (ref 6.0–8.3)

## 2012-01-02 LAB — CBC WITH DIFFERENTIAL/PLATELET
Basophils Relative: 0.4 % (ref 0.0–3.0)
Eosinophils Relative: 2.5 % (ref 0.0–5.0)
Lymphocytes Relative: 29.2 % (ref 12.0–46.0)
Monocytes Relative: 10 % (ref 3.0–12.0)
Neutrophils Relative %: 57.9 % (ref 43.0–77.0)
Platelets: 220 10*3/uL (ref 150.0–400.0)
RBC: 4.47 Mil/uL (ref 3.87–5.11)
WBC: 6.3 10*3/uL (ref 4.5–10.5)

## 2012-01-02 LAB — POCT INR: INR: 1.2

## 2012-01-02 LAB — IRON: Iron: 37 ug/dL — ABNORMAL LOW (ref 42–145)

## 2012-01-02 NOTE — Progress Notes (Signed)
Subjective:    Patient ID: [name], female    DOB: 10/05/1928, 78 y.o.   MRN: [id number]  HPI  The patient has a long hx of GERD related to esophageal cancer surgery and had developed gastric outlet obstruction. She underwent stenting by Dr Barton Dubois and had done remarkably well post prosedure. She still has issue with fatigue. She was noted to have AF and was started on coumadin. There are risks to this therapy and she has moderate AS and Mitral regurgitation.   Review of Systems  Constitutional: Positive for fatigue. Negative for activity change and appetite change.  HENT: Negative for ear pain, congestion, neck pain, postnasal drip and sinus pressure.   Eyes: Negative for redness and visual disturbance.  Respiratory: Positive for shortness of breath. Negative for cough and wheezing.   Gastrointestinal: Negative for abdominal pain and abdominal distention.  Genitourinary: Negative for dysuria, frequency and menstrual problem.  Musculoskeletal: Negative for myalgias, joint swelling and arthralgias.  Skin: Negative for rash and wound.  Neurological: Positive for tremors and weakness. Negative for dizziness and headaches.  Hematological: Negative for adenopathy. Does not bruise/bleed easily.  Psychiatric/Behavioral: Negative for sleep disturbance and decreased concentration.   Past Medical History  Diagnosis Date  . GERD (gastroesophageal reflux disease)   . Osteoporosis   . Postmenopausal HRT (hormone replacement therapy)   . Anemia   . Hypertension   . Allergy   . Asthma   . Shortness of breath   . PONV (postoperative nausea and vomiting)   . Pneumonia      Hx of pneumonia,71months ago  . Esophageal cancer     removed esophagus stomach pulled up    History   Social History  . Marital Status: Single    Spouse Name: N/A    Number of Children: 1  . Years of Education: N/A   Occupational History  . Retired    Social History Main Topics  . Smoking status: Never  Smoker   . Smokeless tobacco: Never Used  . Alcohol Use: No  . Drug Use: No  . Sexually Active: Yes   Other Topics Concern  . Not on file   Social History Narrative   DAILY CAFFEINE USE    Past Surgical History  Procedure Date  . Left oophorectomy   . Esophageal removal of cancer,gastric ppull-thru 1993   . Cholecystectomy   . Rt arm fx   . Esophagogastroduodenoscopy 10/19/2011    Procedure: ESOPHAGOGASTRODUODENOSCOPY (EGD);  Surgeon: Rachael Fee, MD;  Location: [facility];  Service: Endoscopy;  Laterality: N/A;  . Balloon dilation 10/19/2011    Procedure: BALLOON DILATION;  Surgeon: Rachael Fee, MD;  Location: [facility];  Service: Endoscopy;  Laterality: N/A;  . Esophagogastroduodenoscopy 11/07/2011    Procedure: ESOPHAGOGASTRODUODENOSCOPY (EGD);  Surgeon: Meryl Dare, MD,FACG;  Location: [facility];  Service: Endoscopy;  Laterality: N/A;  . Esophagogastroduodenoscopy 11/22/2011    Procedure: ESOPHAGOGASTRODUODENOSCOPY (EGD);  Surgeon: Louis Meckel, MD;  Location: [facility];  Service: Endoscopy;  Laterality: N/A;  . Esophagogastroduodenoscopy 11/24/2011    Procedure: ESOPHAGOGASTRODUODENOSCOPY (EGD);  Surgeon: Louis Meckel, MD;  Location: [facility];  Service: Endoscopy;  Laterality: N/A;  with removable duodenal stent (actually using esophageal partially covered 23X15 located in locked supply room  . Duodenal stent placement 11/24/2011    Procedure: DUODENAL STENT PLACEMENT;  Surgeon: Louis Meckel, MD;  Location: [facility] ENDOSCOPY;  Service: Endoscopy;  Laterality: N/A;    Family History  Problem Relation Age of  Onset  . Cervical cancer Mother   . Heart disease Father   . Coronary artery disease    . Hypotension Sister   . Colon cancer Neg Hx   . Colon polyps Sister   . Pancreatic cancer      1/2 sister    Allergies  Allergen Reactions  . Erythromycin Ethylsuccinate Other (See Comments)    REACTION: unspecified  . Prednisone Other (See Comments)     REACTION: unspecified    Current Outpatient Prescriptions on File Prior to Visit  Medication Sig Dispense Refill  . acetaminophen (TYLENOL) 325 MG tablet Take 2 tablets (650 mg total) by mouth every 6 (six) hours as needed (or Fever >/= 101).      Marland Kitchen albuterol (PROVENTIL HFA;VENTOLIN HFA) 108 (90 BASE) MCG/ACT inhaler Inhale 2 puffs into the lungs every 6 (six) hours as needed. For wheezing      . Alum & Mag Hydroxide-Simeth (MAGIC MOUTHWASH W/LIDOCAINE) SOLN Take 5 mLs by mouth 3 (three) times daily.      Marland Kitchen antiseptic oral rinse (BIOTENE) LIQD 15 mLs by Mouth Rinse route 2 (two) times daily.      . B Complex-C-Min-Fe-FA (HEMOCYTE PLUS) 106-1 MG CAPS Take 106 mg by mouth 2 (two) times daily.  180 each  3  . bisoprolol (ZEBETA) 5 MG tablet Take 0.5 tablets (2.5 mg total) by mouth daily.      . Calcium-Vitamin D-Vitamin K (VIACTIV) 500-100-40 MG-UNT-MCG CHEW Chew by mouth 3 (three) times daily.        . digoxin (LANOXIN) 0.125 MG tablet Take 1 tablet (0.125 mg total) by mouth daily.      . lansoprazole (PREVACID SOLUTAB) 30 MG disintegrating tablet Take 2 tablets (60 mg total) by mouth 2 (two) times daily.  360 tablet  3  . levocetirizine (XYZAL) 5 MG tablet Take 5 mg by mouth every evening. Patient takes only as needed      . metoCLOPramide (REGLAN) 5 MG tablet Take 1 tablet (5 mg total) by mouth 3 (three) times daily before meals.  180 tablet  3  . phenol (CHLORASEPTIC) 1.4 % LIQD Use as directed 2 sprays in the mouth or throat as needed.      . promethazine (PHENERGAN) 25 MG tablet Take 25 mg by mouth every 6 (six) hours as needed.      . sucralfate (CARAFATE) 1 GM/10ML suspension Take 10 mLs (1 g total) by mouth 2 (two) times daily before a meal.  3600 mL  3  . Vitamin D, Ergocalciferol, (DRISDOL) 50000 UNITS CAPS Take 50,000 Units by mouth every 7 (seven) days. On Friday      . zolpidem (AMBIEN) 5 MG tablet Take 1 tablet (5 mg total) by mouth at bedtime as needed for sleep.  90 tablet  1    . DISCONTD: loratadine (CLARITIN) 10 MG tablet Take 0.5 tablets (5 mg total) by mouth every evening.        BP 110/64  Pulse 64  Temp 98.2 F (36.8 C)  Resp 16  Ht 5\' 3"  (1.6 m)  Wt 117 lb (53.071 kg)  BMI 20.73 kg/m2       Objective:   Physical Exam  Constitutional: She is oriented to person, place, and time. She appears well-developed and well-nourished. No distress.  HENT:  Head: Normocephalic and atraumatic.  Right Ear: External ear normal.  Left Ear: External ear normal.  Nose: Nose normal.  Mouth/Throat: Oropharynx is clear and moist.  Eyes: Conjunctivae and EOM  are normal. Pupils are equal, round, and reactive to light.  Neck: Normal range of motion. Neck supple. No JVD present. No tracheal deviation present. No thyromegaly present.  Cardiovascular: Normal rate and regular rhythm.   Murmur heard. Pulmonary/Chest: Effort normal and breath sounds normal. She has no wheezes. She exhibits no tenderness.  Abdominal: Soft. Bowel sounds are normal.  Musculoskeletal: Normal range of motion. She exhibits no edema and no tenderness.  Lymphadenopathy:    She has no cervical adenopathy.  Neurological: She is alert and oriented to person, place, and time. She has normal reflexes. No cranial nerve deficit.  Skin: Skin is warm and dry. She is not diaphoretic.  Psychiatric: She has a normal mood and affect. Her behavior is normal.          Assessment & Plan:  Patient has a history of esophageal cancer status post small bowel pull-through with the development of gastric outlet obstruction status post stenting and has done remarkably well after this procedure.  She feels like her strength is returning and that she is ready to increase her activity.  She was in rehabilitation 3 weeks. Today she should have a renal function and her blood count monitored as well as her pro time because she is on chronic anticoagulation for the discovery of atrial fibrillation during her  hospitalization.  There will be some discussion about long-term anticoagulation with the monitoring of her atrial fibrillation she had multiple episodes of aspiration that may have created a pulmonary etiology for atrial fibrillation and if she maintains sinus rhythm I would consider her a high risk anticoagulation patient for long-term anticoagulation.

## 2012-01-02 NOTE — Patient Instructions (Addendum)
The patient is instructed to continue all medications as prescribed. Schedule followup with check out clerk upon leaving the clinic    Latest dosing instructions   Total Sun Mon Tue Wed Thu Fri Sat   42.5 7.5 mg 7.5 mg 5 mg 5 mg 7.5 mg 5 mg 5 mg    (5 mg1.5) (5 mg1.5) (5 mg1) (5 mg1) (5 mg1.5) (5 mg1) (5 mg1)

## 2012-01-06 ENCOUNTER — Ambulatory Visit: Payer: Medicare Other | Admitting: Gastroenterology

## 2012-01-09 ENCOUNTER — Telehealth: Payer: Self-pay | Admitting: Family Medicine

## 2012-01-09 MED ORDER — WARFARIN SODIUM 5 MG PO TABS: 5.0000 mg | ORAL_TABLET | Freq: Every day | ORAL | Status: DC

## 2012-01-09 NOTE — Telephone Encounter (Signed)
Pulled from Triage vmail. Pt called at 9:31. States that when she was here 6/3 for PT check, no rx was called in. She is almost out of Coumadin, and requests a Rx be called in.

## 2012-01-09 NOTE — Telephone Encounter (Signed)
Sent to pharmacy-pt informed 

## 2012-01-10 ENCOUNTER — Telehealth: Payer: Self-pay | Admitting: Internal Medicine

## 2012-01-10 MED ORDER — DIGOXIN 125 MCG PO TABS: 0.1250 mg | ORAL_TABLET | Freq: Every day | ORAL | Status: DC

## 2012-01-10 NOTE — Telephone Encounter (Signed)
Talked to [facility] and ordered both dig and coumadin-Left message on machine For pt they will be ready this pm

## 2012-01-10 NOTE — Telephone Encounter (Signed)
Caller: [name]/Patient; PCP: [name]; CB#: [phone];UJWJ regarding Coumadin 5 not rec'd at local pharmacy after 01/02/12.  She will run out of medications before next visit.  EMR shows Coumadin 5 mg tablets called to [facility], but then opening the original order shows it was cancelled as duplicate.  Digoxin 0.125 mg  Rx not rec'd as of  01/02/12.; Per EMR the Digoxin was sent to [facility]; patient states she will be out of these meds on 6/13.  Please follow up on this.  She wants Rx locally to [facility].  Medication Questions protocol used.

## 2012-01-16 ENCOUNTER — Ambulatory Visit (INDEPENDENT_AMBULATORY_CARE_PROVIDER_SITE_OTHER): Payer: Medicare Other | Admitting: Family

## 2012-01-16 DIAGNOSIS — I4891 Unspecified atrial fibrillation: Secondary | ICD-10-CM

## 2012-01-16 LAB — POCT INR: INR: 1.5

## 2012-01-16 NOTE — Patient Instructions (Addendum)
  Latest dosing instructions   Total Sun Mon Tue Wed Thu Fri Sat   42.5 7.5 mg 7.5 mg 5 mg 5 mg 7.5 mg 5 mg 5 mg    (5 mg1.5) (5 mg1.5) (5 mg1) (5 mg1) (5 mg1.5) (5 mg1) (5 mg1)       10mg  tonight, then same dosing of  Sunday, Monday, and Thursday 7.5mg  and 5mg  all other days. Recheck in 2 weeks.

## 2012-01-16 NOTE — Addendum Note (Signed)
Addended byAdline Mango B on: 01/16/2012 02:31 PM   Modules accepted: Orders

## 2012-01-25 ENCOUNTER — Ambulatory Visit (INDEPENDENT_AMBULATORY_CARE_PROVIDER_SITE_OTHER): Payer: Medicare Other | Admitting: Gastroenterology

## 2012-01-25 ENCOUNTER — Encounter: Payer: Self-pay | Admitting: Gastroenterology

## 2012-01-25 VITALS — BP 120/62 | HR 60 | Ht 63.0 in | Wt 121.0 lb

## 2012-01-25 DIAGNOSIS — K311 Adult hypertrophic pyloric stenosis: Secondary | ICD-10-CM

## 2012-01-25 NOTE — Patient Instructions (Addendum)
Follow up in one year We will renew your prevacid today for one year

## 2012-01-25 NOTE — Assessment & Plan Note (Signed)
Status post duodenal stent placement. She continues to do well. No further therapy at this point

## 2012-01-25 NOTE — Progress Notes (Signed)
History of Present Illness:  [name] has returned for followup of  gastric outlet obstruction. Duodenal stents are in place. She's eating without difficulty and denies nausea or vomiting. She continues to gain weight.    Review of Systems: Pertinent positive and negative review of systems were noted in the above HPI section. All other review of systems were otherwise negative.    Current Medications, Allergies, Past Medical History, Past Surgical History, Family History and Social History were reviewed in [employer] [facility] electronic medical record  Vital signs were reviewed in today's medical record. Physical Exam: General: Well developed , well nourished, no acute distress

## 2012-01-30 ENCOUNTER — Ambulatory Visit (INDEPENDENT_AMBULATORY_CARE_PROVIDER_SITE_OTHER): Payer: Medicare Other | Admitting: Family

## 2012-01-30 DIAGNOSIS — I4891 Unspecified atrial fibrillation: Secondary | ICD-10-CM

## 2012-01-30 LAB — POCT INR: INR: 2.1

## 2012-01-30 NOTE — Patient Instructions (Addendum)
  Latest dosing instructions   Total Sun Mon Tue Wed Thu Fri Sat   42.5 7.5 mg 7.5 mg 5 mg 5 mg 7.5 mg 5 mg 5 mg    (5 mg1.5) (5 mg1.5) (5 mg1) (5 mg1) (5 mg1.5) (5 mg1) (5 mg1)       Continue same dose. Recheck in 3 weeks.

## 2012-02-20 ENCOUNTER — Ambulatory Visit (INDEPENDENT_AMBULATORY_CARE_PROVIDER_SITE_OTHER): Payer: Medicare Other | Admitting: Family

## 2012-02-20 DIAGNOSIS — I4891 Unspecified atrial fibrillation: Secondary | ICD-10-CM

## 2012-02-20 LAB — POCT INR: INR: 2.4

## 2012-02-20 NOTE — Patient Instructions (Signed)
Sunday, Monday, and Thursday 7.5mg  and 5mg  all other days. Recheck in 3 weeks.    Latest dosing instructions   Total Sun Mon Tue Wed Thu Fri Sat   42.5 7.5 mg 7.5 mg 5 mg 5 mg 7.5 mg 5 mg 5 mg    (5 mg1.5) (5 mg1.5) (5 mg1) (5 mg1) (5 mg1.5) (5 mg1) (5 mg1)

## 2012-03-01 ENCOUNTER — Telehealth: Payer: Self-pay | Admitting: Internal Medicine

## 2012-03-01 MED ORDER — PROMETHAZINE HCL 25 MG RE SUPP: 25.0000 mg | Freq: Four times a day (QID) | RECTAL | Status: DC | PRN

## 2012-03-01 NOTE — Telephone Encounter (Signed)
Pt requesting refill on PROMETHAZINE HCL 25 MG RE SUPP  [facility] [place]  Pt also requesting to be contacted

## 2012-03-02 ENCOUNTER — Telehealth: Payer: Self-pay | Admitting: *Deleted

## 2012-03-02 NOTE — Telephone Encounter (Signed)
APPROVED FOR PREVACID FROM 02/01/2012-03/2014  [facility]  APPROVED FROM 02/10/2012 UNTIL 02/26/2013  [facility] WILL CONTACT THE PATIENT

## 2012-03-12 ENCOUNTER — Ambulatory Visit (INDEPENDENT_AMBULATORY_CARE_PROVIDER_SITE_OTHER): Payer: Medicare Other | Admitting: Internal Medicine

## 2012-03-12 ENCOUNTER — Ambulatory Visit (INDEPENDENT_AMBULATORY_CARE_PROVIDER_SITE_OTHER): Payer: Medicare Other | Admitting: Family

## 2012-03-12 ENCOUNTER — Encounter: Payer: Self-pay | Admitting: Internal Medicine

## 2012-03-12 VITALS — BP 110/70 | HR 72 | Temp 98.6°F | Resp 16 | Ht 63.0 in | Wt 120.0 lb

## 2012-03-12 DIAGNOSIS — R1110 Vomiting, unspecified: Secondary | ICD-10-CM

## 2012-03-12 DIAGNOSIS — K30 Functional dyspepsia: Secondary | ICD-10-CM

## 2012-03-12 DIAGNOSIS — K3189 Other diseases of stomach and duodenum: Secondary | ICD-10-CM

## 2012-03-12 DIAGNOSIS — D649 Anemia, unspecified: Secondary | ICD-10-CM

## 2012-03-12 DIAGNOSIS — K219 Gastro-esophageal reflux disease without esophagitis: Secondary | ICD-10-CM

## 2012-03-12 DIAGNOSIS — I4891 Unspecified atrial fibrillation: Secondary | ICD-10-CM

## 2012-03-12 LAB — POCT INR: INR: 1.7

## 2012-03-12 MED ORDER — METOCLOPRAMIDE HCL 5 MG PO TABS: 5.0000 mg | ORAL_TABLET | Freq: Two times a day (BID) | ORAL | Status: DC

## 2012-03-12 NOTE — Progress Notes (Signed)
  Subjective:    Patient ID: [name], female    DOB: 28-Aug-1928, 78 y.o.   MRN: [id number]  HPI On coumadin for AF but  Has been in SR Patient has a history of severe esophageal reflux due to a small bowel pull-through for esophageal cancer.  She has been on Reglan with controlled nausea and vomiting and had undergone a surgical procedure to help prevent reflux.     Review of Systems  Constitutional: Negative for activity change, appetite change and fatigue.  HENT: Negative for ear pain, congestion, neck pain, postnasal drip and sinus pressure.   Eyes: Negative for redness and visual disturbance.  Respiratory: Negative for cough, shortness of breath and wheezing.   Gastrointestinal: Negative for abdominal pain and abdominal distention.  Genitourinary: Negative for dysuria, frequency and menstrual problem.  Musculoskeletal: Negative for myalgias, joint swelling and arthralgias.  Skin: Negative for rash and wound.  Neurological: Negative for dizziness, weakness and headaches.  Hematological: Negative for adenopathy. Does not bruise/bleed easily.  Psychiatric/Behavioral: Negative for disturbed wake/sleep cycle and decreased concentration.       Objective:   Physical Exam  Vitals reviewed. Constitutional: She is oriented to person, place, and time. She appears well-developed and well-nourished. No distress.  HENT:  Head: Normocephalic and atraumatic.  Right Ear: External ear normal.  Left Ear: External ear normal.  Nose: Nose normal.  Mouth/Throat: Oropharynx is clear and moist.  Eyes: Conjunctivae and EOM are normal. Pupils are equal, round, and reactive to light.  Neck: Normal range of motion. Neck supple. No JVD present. No tracheal deviation present. No thyromegaly present.  Cardiovascular: Normal rate, regular rhythm, normal heart sounds and intact distal pulses.   No murmur heard. Pulmonary/Chest: Effort normal and breath sounds normal. She has no wheezes. She  exhibits no tenderness.  Abdominal: Soft. Bowel sounds are normal.  Musculoskeletal: Normal range of motion. She exhibits no edema and no tenderness.  Lymphadenopathy:    She has no cervical adenopathy.  Neurological: She is alert and oriented to person, place, and time. She has normal reflexes. No cranial nerve deficit.  Skin: Skin is warm and dry. She is not diaphoretic.  Psychiatric: She has a normal mood and affect. Her behavior is normal.          Assessment & Plan:  She is doing exceptionally well on her diet and reflux prevention program she has slight swelling of her feet which is new which may be secondary to her Coumadin therapy and possible anemia therefore we will check a CBC with differential and discussed the findings with her at that time.  Otherwise she is stable. We will also discuss the discontinuation of Coumadin if she remains in sinus rhythm since she is a high-risk patient for Coumadin therapy

## 2012-03-12 NOTE — Patient Instructions (Signed)
Tonight only tabs. Then continue Sunday, Monday, and Thursday 7.5mg  and 5mg  all other days. Recheck in 4 weeks.    Latest dosing instructions   Total Sun Mon Tue Wed Thu Fri Sat   42.5 7.5 mg 7.5 mg 5 mg 5 mg 7.5 mg 5 mg 5 mg    (5 mg1.5) (5 mg1.5) (5 mg1) (5 mg1) (5 mg1.5) (5 mg1) (5 mg1)

## 2012-03-13 LAB — CBC WITH DIFFERENTIAL/PLATELET
Basophils Absolute: 0.1 10*3/uL (ref 0.0–0.1)
Eosinophils Absolute: 0.1 10*3/uL (ref 0.0–0.7)
Lymphocytes Relative: 25.4 % (ref 12.0–46.0)
MCHC: 32.3 g/dL (ref 30.0–36.0)
Monocytes Relative: 8.8 % (ref 3.0–12.0)
Neutro Abs: 4.6 10*3/uL (ref 1.4–7.7)
Platelets: 210 10*3/uL (ref 150.0–400.0)
RDW: 15.6 % — ABNORMAL HIGH (ref 11.5–14.6)

## 2012-03-16 ENCOUNTER — Other Ambulatory Visit: Payer: Self-pay | Admitting: *Deleted

## 2012-03-16 ENCOUNTER — Telehealth: Payer: Self-pay | Admitting: *Deleted

## 2012-03-16 MED ORDER — WARFARIN SODIUM 5 MG PO TABS: 5.0000 mg | ORAL_TABLET | Freq: Every day | ORAL | Status: DC

## 2012-03-16 MED ORDER — DIGOXIN 125 MCG PO TABS: 0.1250 mg | ORAL_TABLET | Freq: Every day | ORAL | Status: DC

## 2012-03-16 NOTE — Telephone Encounter (Signed)
refill 

## 2012-04-09 ENCOUNTER — Ambulatory Visit (INDEPENDENT_AMBULATORY_CARE_PROVIDER_SITE_OTHER): Payer: Medicare Other | Admitting: Family

## 2012-04-09 DIAGNOSIS — I4891 Unspecified atrial fibrillation: Secondary | ICD-10-CM

## 2012-04-09 DIAGNOSIS — Z7901 Long term (current) use of anticoagulants: Secondary | ICD-10-CM

## 2012-04-09 NOTE — Patient Instructions (Addendum)
Continue Sunday, Monday, and Thursday 7.5mg  and 5mg  all other days. Recheck in 4 weeks.    Latest dosing instructions   Total Sun Mon Tue Wed Thu Fri Sat   42.5 7.5 mg 7.5 mg 5 mg 5 mg 7.5 mg 5 mg 5 mg    (5 mg1.5) (5 mg1.5) (5 mg1) (5 mg1) (5 mg1.5) (5 mg1) (5 mg1)

## 2012-05-07 ENCOUNTER — Ambulatory Visit (INDEPENDENT_AMBULATORY_CARE_PROVIDER_SITE_OTHER): Payer: Medicare Other | Admitting: Family

## 2012-05-07 DIAGNOSIS — Z7901 Long term (current) use of anticoagulants: Secondary | ICD-10-CM

## 2012-05-07 DIAGNOSIS — I4891 Unspecified atrial fibrillation: Secondary | ICD-10-CM

## 2012-05-07 LAB — POCT INR: INR: 3.1

## 2012-05-07 NOTE — Patient Instructions (Addendum)
Take 1 tab today only. Then continue Sunday, Monday, and Thursday 7.5mg  and 5mg  all other days. Recheck in 4 weeks.    Latest dosing instructions   Total Sun Mon Tue Wed Thu Fri Sat   42.5 7.5 mg 7.5 mg 5 mg 5 mg 7.5 mg 5 mg 5 mg    (5 mg1.5) (5 mg1.5) (5 mg1) (5 mg1) (5 mg1.5) (5 mg1) (5 mg1)

## 2012-06-12 ENCOUNTER — Ambulatory Visit (INDEPENDENT_AMBULATORY_CARE_PROVIDER_SITE_OTHER)
Admission: RE | Admit: 2012-06-12 | Discharge: 2012-06-12 | Disposition: A | Discharge status: Home / Self Care | Payer: Medicare Other | Source: Ambulatory Visit | Attending: Internal Medicine | Admitting: Internal Medicine

## 2012-06-12 ENCOUNTER — Encounter: Payer: Self-pay | Admitting: Internal Medicine

## 2012-06-12 ENCOUNTER — Ambulatory Visit (INDEPENDENT_AMBULATORY_CARE_PROVIDER_SITE_OTHER): Payer: Medicare Other | Admitting: Family

## 2012-06-12 ENCOUNTER — Ambulatory Visit (INDEPENDENT_AMBULATORY_CARE_PROVIDER_SITE_OTHER): Payer: Medicare Other | Admitting: Internal Medicine

## 2012-06-12 VITALS — BP 130/80 | HR 76 | Temp 98.0°F | Resp 16 | Ht 63.0 in | Wt 122.0 lb

## 2012-06-12 DIAGNOSIS — M79609 Pain in unspecified limb: Secondary | ICD-10-CM

## 2012-06-12 DIAGNOSIS — M79606 Pain in leg, unspecified: Secondary | ICD-10-CM

## 2012-06-12 DIAGNOSIS — R1012 Left upper quadrant pain: Secondary | ICD-10-CM

## 2012-06-12 DIAGNOSIS — R0789 Other chest pain: Secondary | ICD-10-CM

## 2012-06-12 DIAGNOSIS — Z79899 Other long term (current) drug therapy: Secondary | ICD-10-CM

## 2012-06-12 LAB — CBC WITH DIFFERENTIAL/PLATELET
Basophils Absolute: 0 10*3/uL (ref 0.0–0.1)
Basophils Relative: 0.3 % (ref 0.0–3.0)
Eosinophils Absolute: 0.1 10*3/uL (ref 0.0–0.7)
Lymphocytes Relative: 32.4 % (ref 12.0–46.0)
MCHC: 32.1 g/dL (ref 30.0–36.0)
Neutrophils Relative %: 56 % (ref 43.0–77.0)
RBC: 4.71 Mil/uL (ref 3.87–5.11)
RDW: 16 % — ABNORMAL HIGH (ref 11.5–14.6)

## 2012-06-12 LAB — POCT INR: INR: 6.6

## 2012-06-12 MED ORDER — BISOPROLOL-HYDROCHLOROTHIAZIDE 2.5-6.25 MG PO TABS: 1.0000 | ORAL_TABLET | Freq: Every day | ORAL | Status: DC

## 2012-06-12 MED ORDER — PROMETHAZINE HCL 25 MG RE SUPP: 25.0000 mg | Freq: Four times a day (QID) | RECTAL | Status: DC | PRN

## 2012-06-12 MED ORDER — TRAMADOL HCL 50 MG PO TABS: 50.0000 mg | ORAL_TABLET | Freq: Three times a day (TID) | ORAL | Status: DC | PRN

## 2012-06-12 NOTE — Progress Notes (Signed)
  Subjective:    Patient ID: [name], female    DOB: Sep 17, 1928, 78 y.o.   MRN: [id number]  HPI Patient presents for multiple planes today including routine followup for her hypertension she's had increased pain from her varicose veins has been taking Tylenol every 4 hours as needed.  She has been on her Coumadin and her protime today was 6.6 required [place] to hold her Coumadin for several days and I believe these increase was in part due to the excessive Tylenol use.  We will place Tylenol with Ultram for pain control.  She has a sharp pain in her left flank under the rib cage. She is to see GI on Thursday at 10:00 prior to that time we will have an abdominal ultrasound done today we will get a CBC with differential and a plain film of the chest and rib cage.    Review of Systems  Constitutional: Positive for activity change and appetite change.  HENT: Positive for congestion, rhinorrhea and postnasal drip.   Respiratory: Positive for chest tightness and wheezing. Negative for shortness of breath and stridor.   Cardiovascular: Positive for chest pain.  Gastrointestinal: Positive for abdominal pain.  Genitourinary: Negative.   Musculoskeletal: Negative.   Skin: Negative.        Objective:   Physical Exam  Nursing note and vitals reviewed. Constitutional: She is oriented to person, place, and time. She appears well-developed and well-nourished. No distress.  HENT:  Head: Normocephalic and atraumatic.  Eyes: Conjunctivae normal and EOM are normal. Pupils are equal, round, and reactive to light.  Neck: Normal range of motion. Neck supple. No JVD present. No tracheal deviation present. No thyromegaly present.  Cardiovascular: Intact distal pulses.   Murmur heard. Pulmonary/Chest: Effort normal. She has no wheezes. She has rales. She exhibits no tenderness.       Slight crackles at left base posteriorly not heard anteriorly  Abdominal: Soft. Bowel sounds are normal.    Musculoskeletal: Normal range of motion. She exhibits no edema and no tenderness.  Lymphadenopathy:    She has no cervical adenopathy.  Neurological: She is alert and oriented to person, place, and time. She has normal reflexes. No cranial nerve deficit.  Skin: Skin is warm and dry. She is not diaphoretic.  Psychiatric: She has a normal mood and affect. Her behavior is normal.          Assessment & Plan:  We'll obtain an abdominal ultrasound and a CBC. She has had prior surgery for her reflux and has an appointment in 48 hours with gastroenterology the pain is intermittent and not constant at this time.  She also has a history of iron deficiency anemia so CBC is oriented.  We'll also get a lipase to make sure there is no pancreatic inflammation. An abdominal ultrasound. She is stable from the standpoint of her asthma and her blood pressure Due to the increase in her INR with the excessive use of the Tylenol we will stop Tylenol hold her Coumadin for 2 days and changed to Ultram for leg pain.

## 2012-06-12 NOTE — Patient Instructions (Addendum)
Hold Coumadin today, Wednesday, Thursday and Friday.  Recheck INR on Friday.    Latest dosing instructions   Total Sun Mon Tue Wed Thu Fri Sat   42.5 7.5 mg 7.5 mg 5 mg 5 mg 7.5 mg 5 mg 5 mg    (5 mg1.5) (5 mg1.5) (5 mg1) (5 mg1) (5 mg1.5) (5 mg1) (5 mg1)

## 2012-06-13 ENCOUNTER — Ambulatory Visit
Admission: RE | Admit: 2012-06-13 | Discharge: 2012-06-13 | Disposition: A | Discharge status: Home / Self Care | Payer: Medicare Other | Source: Ambulatory Visit | Attending: Internal Medicine | Admitting: Internal Medicine

## 2012-06-13 DIAGNOSIS — R1012 Left upper quadrant pain: Secondary | ICD-10-CM

## 2012-06-14 ENCOUNTER — Ambulatory Visit (INDEPENDENT_AMBULATORY_CARE_PROVIDER_SITE_OTHER): Payer: Medicare Other | Admitting: Gastroenterology

## 2012-06-14 ENCOUNTER — Encounter: Payer: Self-pay | Admitting: Gastroenterology

## 2012-06-14 VITALS — BP 120/74 | HR 72 | Ht 63.0 in | Wt 128.0 lb

## 2012-06-14 DIAGNOSIS — D6832 Hemorrhagic disorder due to extrinsic circulating anticoagulants: Secondary | ICD-10-CM | POA: Insufficient documentation

## 2012-06-14 DIAGNOSIS — C159 Malignant neoplasm of esophagus, unspecified: Secondary | ICD-10-CM

## 2012-06-14 DIAGNOSIS — K311 Adult hypertrophic pyloric stenosis: Secondary | ICD-10-CM

## 2012-06-14 DIAGNOSIS — R109 Unspecified abdominal pain: Secondary | ICD-10-CM

## 2012-06-14 DIAGNOSIS — D689 Coagulation defect, unspecified: Secondary | ICD-10-CM

## 2012-06-14 NOTE — Assessment & Plan Note (Signed)
No recent evidence of recurrent disease

## 2012-06-14 NOTE — Assessment & Plan Note (Signed)
Status post duodenal stent placement. Symptoms of pain with nausea and vomiting suggest that she has developed a partial obstruction again.  Recommendations #1 upper GI series

## 2012-06-14 NOTE — Patient Instructions (Addendum)
Your test had been scheduled on 06/15/2012 at 9:30am You will need to arrive at [facility] at 9:15am Nothing to eat or drink 6 hours before your test

## 2012-06-14 NOTE — Assessment & Plan Note (Signed)
Patient was placed on Coumadin approximate 6 months ago after developing a fibrillation.

## 2012-06-14 NOTE — Progress Notes (Signed)
History of Present Illness: [name] has returned for evaluation of abdominal pain and nausea and vomiting.  For the past 6 weeks she has noted sharp transient pain in left upper quadrant, particularly under the ribs, and has intermittent nausea. She vomited 3 times in the last week. Duodenal stents were placed in April, 2013.  Since her last visit she was placed on Coumadin because of atrial fibrillation.    Past Medical History  Diagnosis Date  . GERD (gastroesophageal reflux disease)   . Osteoporosis   . Postmenopausal HRT (hormone replacement therapy)   . Anemia   . Hypertension   . Allergy   . Asthma   . Shortness of breath   . PONV (postoperative nausea and vomiting)   . Pneumonia      Hx of pneumonia,39months ago  . Esophageal cancer     removed esophagus stomach pulled up   Past Surgical History  Procedure Date  . Left oophorectomy   . Esophageal removal of cancer,gastric ppull-thru 1993   . Cholecystectomy   . Rt arm fx   . Esophagogastroduodenoscopy 10/19/2011    Procedure: ESOPHAGOGASTRODUODENOSCOPY (EGD);  Surgeon: Rachael Fee, MD;  Location: [facility];  Service: Endoscopy;  Laterality: N/A;  . Balloon dilation 10/19/2011    Procedure: BALLOON DILATION;  Surgeon: Rachael Fee, MD;  Location: [facility] ENDOSCOPY;  Service: Endoscopy;  Laterality: N/A;  . Esophagogastroduodenoscopy 11/07/2011    Procedure: ESOPHAGOGASTRODUODENOSCOPY (EGD);  Surgeon: Meryl Dare, MD,FACG;  Location: [facility] ENDOSCOPY;  Service: Endoscopy;  Laterality: N/A;  . Esophagogastroduodenoscopy 11/22/2011    Procedure: ESOPHAGOGASTRODUODENOSCOPY (EGD);  Surgeon: Louis Meckel, MD;  Location: [facility];  Service: Endoscopy;  Laterality: N/A;  . Esophagogastroduodenoscopy 11/24/2011    Procedure: ESOPHAGOGASTRODUODENOSCOPY (EGD);  Surgeon: Louis Meckel, MD;  Location: [facility];  Service: Endoscopy;  Laterality: N/A;  with removable duodenal stent (actually using esophageal partially  covered 23X15 located in locked supply room  . Duodenal stent placement 11/24/2011    Procedure: DUODENAL STENT PLACEMENT;  Surgeon: Louis Meckel, MD;  Location: [facility] ENDOSCOPY;  Service: Endoscopy;  Laterality: N/A;   family history includes Cervical cancer in her mother; Colon polyps in her sister; Coronary artery disease in an unspecified family member; Heart disease in her father; Hypotension in her sister; and Pancreatic cancer in an unspecified family member.  There is no history of Colon cancer. Current Outpatient Prescriptions  Medication Sig Dispense Refill  . acetaminophen (TYLENOL) 325 MG tablet Take 2 tablets (650 mg total) by mouth every 6 (six) hours as needed (or Fever >/= 101).      Marland Kitchen albuterol (PROVENTIL HFA;VENTOLIN HFA) 108 (90 BASE) MCG/ACT inhaler Inhale 2 puffs into the lungs every 6 (six) hours as needed. For wheezing      . B Complex-C-Min-Fe-FA (HEMOCYTE PLUS) 106-1 MG CAPS Take 106 mg by mouth 2 (two) times daily.  180 each  3  . bisoprolol (ZEBETA) 5 MG tablet Take 0.5 tablets (2.5 mg total) by mouth daily.      . bisoprolol-hydrochlorothiazide (ZIAC) 2.5-6.25 MG per tablet Take 1 tablet by mouth daily.  90 tablet  3  . Calcium-Vitamin D-Vitamin K (VIACTIV) 500-100-40 MG-UNT-MCG CHEW Chew by mouth 3 (three) times daily.        . digoxin (LANOXIN) 0.125 MG tablet Take 1 tablet (0.125 mg total) by mouth daily.  90 tablet  3  . lansoprazole (PREVACID SOLUTAB) 30 MG disintegrating tablet Take 2 tablets (60 mg total) by mouth 2 (two)  times daily.  360 tablet  3  . levocetirizine (XYZAL) 5 MG tablet Take 5 mg by mouth every evening. Patient takes only as needed      . loratadine (CLARITIN) 10 MG tablet Take 5 mg by mouth daily as needed.      . metoCLOPramide (REGLAN) 5 MG tablet Take 1 tablet (5 mg total) by mouth 2 (two) times daily.  180 tablet  3  . phenol (CHLORASEPTIC) 1.4 % LIQD Use as directed 2 sprays in the mouth or throat as needed.      . promethazine (PHENERGAN)  25 MG suppository Place 1 suppository (25 mg total) rectally every 6 (six) hours as needed.  36 each  3  . sucralfate (CARAFATE) 1 GM/10ML suspension Take 10 mLs (1 g total) by mouth 2 (two) times daily before a meal.  3600 mL  3  . traMADol (ULTRAM) 50 MG tablet Take 1 tablet (50 mg total) by mouth every 8 (eight) hours as needed for pain.  30 tablet  5  . Vitamin D, Ergocalciferol, (DRISDOL) 50000 UNITS CAPS Take 50,000 Units by mouth every 7 (seven) days. On Friday      . warfarin (COUMADIN) 5 MG tablet Take 1 tablet (5 mg total) by mouth daily.  100 tablet  3  . zolpidem (AMBIEN) 5 MG tablet Take 1 tablet (5 mg total) by mouth at bedtime as needed for sleep.  90 tablet  1   Allergies as of 06/14/2012 - Review Complete 06/14/2012  Allergen Reaction Noted  . Erythromycin ethylsuccinate Other (See Comments) 09/26/2006  . Prednisone Other (See Comments) 09/26/2006    reports that she has never smoked. She has never used smokeless tobacco. She reports that she does not drink alcohol or use illicit drugs.     Review of Systems: Pertinent positive and negative review of systems were noted in the above HPI section. All other review of systems were otherwise negative.  Vital signs were reviewed in today's medical record Physical Exam: General: Well developed , well nourished, no acute distress Head: Normocephalic and atraumatic Eyes:  sclerae anicteric, EOMI Ears: Normal auditory acuity Mouth: No deformity or lesions Neck: Supple, no masses or thyromegaly Lungs: Clear throughout to auscultation Heart: Regular rate and rhythm; no murmurs, rubs or bruits Abdomen: Soft, non tender and non distended. No masses, hepatosplenomegaly or hernias noted. Normal Bowel sounds; there is a slight succussion splash Rectal:deferred Musculoskeletal: Symmetrical with no gross deformities  Skin: No lesions on visible extremities Pulses:  Normal pulses noted Extremities: No clubbing, cyanosis, edema or  deformities noted Neurological: Alert oriented x 4, grossly nonfocal Cervical Nodes:  No significant cervical adenopathy Inguinal Nodes: No significant inguinal adenopathy Psychological:  Alert and cooperative. Normal mood and affect

## 2012-06-15 ENCOUNTER — Ambulatory Visit (HOSPITAL_COMMUNITY)
Admission: RE | Admit: 2012-06-15 | Discharge: 2012-06-15 | Disposition: A | Discharge status: Home / Self Care | Payer: Medicare Other | Source: Ambulatory Visit | Attending: Gastroenterology | Admitting: Gastroenterology

## 2012-06-15 ENCOUNTER — Other Ambulatory Visit: Payer: Self-pay | Admitting: Gastroenterology

## 2012-06-15 ENCOUNTER — Ambulatory Visit (INDEPENDENT_AMBULATORY_CARE_PROVIDER_SITE_OTHER): Payer: Medicare Other | Admitting: Family

## 2012-06-15 DIAGNOSIS — R197 Diarrhea, unspecified: Secondary | ICD-10-CM | POA: Insufficient documentation

## 2012-06-15 DIAGNOSIS — R109 Unspecified abdominal pain: Secondary | ICD-10-CM

## 2012-06-15 DIAGNOSIS — K315 Obstruction of duodenum: Secondary | ICD-10-CM | POA: Insufficient documentation

## 2012-06-15 DIAGNOSIS — I4891 Unspecified atrial fibrillation: Secondary | ICD-10-CM

## 2012-06-15 DIAGNOSIS — Z903 Acquired absence of stomach [part of]: Secondary | ICD-10-CM | POA: Insufficient documentation

## 2012-06-15 DIAGNOSIS — K920 Hematemesis: Secondary | ICD-10-CM | POA: Insufficient documentation

## 2012-06-15 LAB — POCT INR: INR: 1.4

## 2012-06-15 MED ORDER — LANSOPRAZOLE 30 MG PO TBDP: 60.0000 mg | ORAL_TABLET | Freq: Two times a day (BID) | ORAL | Status: DC

## 2012-06-15 NOTE — Patient Instructions (Addendum)
Take 2 tabs today only. Then resume Coumadin at 5mg  a day eccept 7.5mg  on Tuesday and Thursday.     Latest dosing instructions   Total Sun Mon Tue Wed Thu Fri Sat   40 5 mg 5 mg 7.5 mg 5 mg 7.5 mg 5 mg 5 mg    (5 mg1) (5 mg1) (5 mg1.5) (5 mg1) (5 mg1.5) (5 mg1) (5 mg1)

## 2012-06-15 NOTE — Telephone Encounter (Signed)
[facility] Medication is not on formulary but patient stated she has been getting the refilled there

## 2012-06-21 NOTE — Progress Notes (Signed)
Quick Note:  Upper GI series was reviewed. There is delayed gastric emptying until the patient sits stands erect. Please instruct the patient to sit upright when she eats and at least half an hour postprandially. Return office visit next 1-2 weeks ______

## 2012-06-27 ENCOUNTER — Encounter: Payer: Medicare Other | Admitting: Family

## 2012-06-27 DIAGNOSIS — Z0289 Encounter for other administrative examinations: Secondary | ICD-10-CM

## 2012-07-02 ENCOUNTER — Ambulatory Visit (INDEPENDENT_AMBULATORY_CARE_PROVIDER_SITE_OTHER): Payer: Medicare Other | Admitting: Family

## 2012-07-02 DIAGNOSIS — I4891 Unspecified atrial fibrillation: Secondary | ICD-10-CM

## 2012-07-02 DIAGNOSIS — Z7901 Long term (current) use of anticoagulants: Secondary | ICD-10-CM

## 2012-07-02 LAB — POCT INR: INR: 3.1

## 2012-07-02 NOTE — Patient Instructions (Addendum)
Eat an extra serving of green veggies. Coumadin at 5mg  a day eccept 7.5mg  on Tuesday and Thursday. Recheck in 3 weeks.     Latest dosing instructions   Total Sun Mon Tue Wed Thu Fri Sat   40 5 mg 5 mg 7.5 mg 5 mg 7.5 mg 5 mg 5 mg    (5 mg1) (5 mg1) (5 mg1.5) (5 mg1) (5 mg1.5) (5 mg1) (5 mg1)

## 2012-07-05 ENCOUNTER — Encounter: Payer: Self-pay | Admitting: Gastroenterology

## 2012-07-05 ENCOUNTER — Ambulatory Visit (INDEPENDENT_AMBULATORY_CARE_PROVIDER_SITE_OTHER): Payer: Medicare Other | Admitting: Gastroenterology

## 2012-07-05 VITALS — BP 116/68 | HR 78 | Ht 63.0 in | Wt 127.0 lb

## 2012-07-05 DIAGNOSIS — K311 Adult hypertrophic pyloric stenosis: Secondary | ICD-10-CM

## 2012-07-05 NOTE — Patient Instructions (Addendum)
1. Discontinue Reglan for 1 week then Discontinue Carafate 2.Use a liquid antiacid for nausea 3.Try Guaifenesin for congestion

## 2012-07-05 NOTE — Progress Notes (Signed)
History of Present Illness:  The patient has returned for followup of nausea. Upper GI series demonstrated a patent duodenal stent. It was noted that emptying from her gastric pouch was dependent upon gravity. The patient now stands up when she eats. Nausea is clearly improved although it remains. This is an intermittent problem. She will vomit up just small amounts of liquid but not food. She remains on Carafate, Reglan and Prevacid. She's been on Reglan since 1995 and developed a resting hand tremor. If she lies flat she has free regurgitation of gastric contents.    Review of Systems: She complains of coughing up phlegm .  Pertinent positive and negative review of systems were noted in the above HPI section. All other review of systems were otherwise negative.    Current Medications, Allergies, Past Medical History, Past Surgical History, Family History and Social History were reviewed in [employer] [facility] electronic medical record  Vital signs were reviewed in today's medical record. Physical Exam: General: Well developed , well nourished, no acute distress Abdomen is without distention, masses organomegaly. There is no succussion splash

## 2012-07-05 NOTE — Assessment & Plan Note (Signed)
No obstruction by recent upper GI series. Stent is patent.  Nausea is likely due to free regurgitation of gastric contents.  Recommendations #1 patient was encouraged to eat when sitting upright or standing #2 hold Reglan for 5-7 days; if no ill effects will discontinue Reglan #3 at a separate time patient will hold Carafate and discontinue this if there are no ill effects from holding this medicine #4 continue Prevacid

## 2012-07-23 ENCOUNTER — Encounter: Payer: Medicare Other | Admitting: Family

## 2012-07-27 ENCOUNTER — Ambulatory Visit (INDEPENDENT_AMBULATORY_CARE_PROVIDER_SITE_OTHER): Payer: Medicare Other | Admitting: Family

## 2012-07-27 DIAGNOSIS — Z7901 Long term (current) use of anticoagulants: Secondary | ICD-10-CM

## 2012-07-27 DIAGNOSIS — I4891 Unspecified atrial fibrillation: Secondary | ICD-10-CM

## 2012-07-27 NOTE — Patient Instructions (Addendum)
Eat an extra serving of green veggies. Do not take coumadin today or tomorrow. Resume Coumadin at 5mg  a day.  Except 7.5mg  on Tuesday only! Recheck in 2 weeks.    Latest dosing instructions   Total Sun Mon Tue Wed Thu Fri Sat   37.5 5 mg 5 mg 7.5 mg 5 mg 5 mg 5 mg 5 mg    (5 mg1) (5 mg1) (5 mg1.5) (5 mg1) (5 mg1) (5 mg1) (5 mg1)

## 2012-08-10 ENCOUNTER — Ambulatory Visit (INDEPENDENT_AMBULATORY_CARE_PROVIDER_SITE_OTHER): Payer: Medicare Other | Admitting: Family

## 2012-08-10 DIAGNOSIS — Z7901 Long term (current) use of anticoagulants: Secondary | ICD-10-CM

## 2012-08-10 DIAGNOSIS — I4891 Unspecified atrial fibrillation: Secondary | ICD-10-CM

## 2012-08-10 NOTE — Patient Instructions (Addendum)
Today only, take an extra 1/2 tab. Then continue Coumadin at 5mg  a day.  Except 7.5mg  on Tuesday only! Recheck in  3 weeks.     Latest dosing instructions   Total Sun Mon Tue Wed Thu Fri Sat   37.5 5 mg 5 mg 7.5 mg 5 mg 5 mg 5 mg 5 mg    (5 mg1) (5 mg1) (5 mg1.5) (5 mg1) (5 mg1) (5 mg1) (5 mg1)

## 2012-08-14 ENCOUNTER — Telehealth: Payer: Self-pay

## 2012-08-14 NOTE — Telephone Encounter (Signed)
[facility] and [facility] pts Prevacid 30mg  script. Received approval through February 28 2013. Conf #[id number]. Pt aware.

## 2012-08-20 ENCOUNTER — Telehealth: Payer: Self-pay | Admitting: Family

## 2012-08-20 NOTE — Telephone Encounter (Signed)
Pt aware she doesn't need to be seen until her scheduled appt

## 2012-08-20 NOTE — Telephone Encounter (Signed)
Pt had varicose vein surgery (main vein) and would like to know if you need to check her blood any earlier than her next appt on 08/31/12. She is on some other meds and wasn't sure of their effects.

## 2012-08-31 ENCOUNTER — Ambulatory Visit (INDEPENDENT_AMBULATORY_CARE_PROVIDER_SITE_OTHER): Payer: Medicare Other | Admitting: Family

## 2012-08-31 DIAGNOSIS — I4891 Unspecified atrial fibrillation: Secondary | ICD-10-CM

## 2012-08-31 DIAGNOSIS — Z7901 Long term (current) use of anticoagulants: Secondary | ICD-10-CM

## 2012-08-31 NOTE — Patient Instructions (Addendum)
Hold Coumadin today. Then continue Coumadin at 5mg  a day.  Except 7.5mg  on Tuesday only! Recheck in  2 weeks.     Latest dosing instructions   Total Sun Mon Tue Wed Thu Fri Sat   37.5 5 mg 5 mg 7.5 mg 5 mg 5 mg 5 mg 5 mg    (5 mg1) (5 mg1) (5 mg1.5) (5 mg1) (5 mg1) (5 mg1) (5 mg1)

## 2012-09-12 ENCOUNTER — Ambulatory Visit (INDEPENDENT_AMBULATORY_CARE_PROVIDER_SITE_OTHER): Payer: Medicare Other | Admitting: Family

## 2012-09-12 ENCOUNTER — Ambulatory Visit (INDEPENDENT_AMBULATORY_CARE_PROVIDER_SITE_OTHER): Payer: Medicare Other | Admitting: Internal Medicine

## 2012-09-12 ENCOUNTER — Encounter: Payer: Self-pay | Admitting: Internal Medicine

## 2012-09-12 VITALS — BP 110/70 | HR 72 | Temp 98.0°F | Resp 16 | Ht 63.0 in | Wt 118.0 lb

## 2012-09-12 DIAGNOSIS — K3189 Other diseases of stomach and duodenum: Secondary | ICD-10-CM

## 2012-09-12 DIAGNOSIS — K219 Gastro-esophageal reflux disease without esophagitis: Secondary | ICD-10-CM

## 2012-09-12 DIAGNOSIS — I4891 Unspecified atrial fibrillation: Secondary | ICD-10-CM

## 2012-09-12 DIAGNOSIS — Z7901 Long term (current) use of anticoagulants: Secondary | ICD-10-CM

## 2012-09-12 DIAGNOSIS — R1110 Vomiting, unspecified: Secondary | ICD-10-CM

## 2012-09-12 DIAGNOSIS — K30 Functional dyspepsia: Secondary | ICD-10-CM

## 2012-09-12 DIAGNOSIS — J690 Pneumonitis due to inhalation of food and vomit: Secondary | ICD-10-CM

## 2012-09-12 MED ORDER — CEFDINIR 300 MG PO CAPS: 300.0000 mg | ORAL_CAPSULE | Freq: Two times a day (BID) | ORAL | Status: DC

## 2012-09-12 MED ORDER — VITAMIN D (ERGOCALCIFEROL) 1.25 MG (50000 UNIT) PO CAPS: 50000.0000 [IU] | ORAL_CAPSULE | ORAL | Status: DC

## 2012-09-12 MED ORDER — METOCLOPRAMIDE HCL 5 MG PO TABS: 5.0000 mg | ORAL_TABLET | Freq: Two times a day (BID) | ORAL | Status: DC

## 2012-09-12 MED ORDER — ZOLPIDEM TARTRATE 5 MG PO TABS: 5.0000 mg | ORAL_TABLET | Freq: Every evening | ORAL | Status: DC | PRN

## 2012-09-12 NOTE — Patient Instructions (Addendum)
Continue Coumadin at 5mg  a day.  Except 7.5mg  on Tuesday only! Recheck in  3 weeks.   Anticoagulation Dose Instructions as of 09/12/2012     Glynis Smiles Tue Wed Thu Fri Sat   New Dose 5 mg 5 mg 7.5 mg 5 mg 5 mg 5 mg 5 mg    Description       Continue Coumadin at 5mg  a day.  Except 7.5mg  on Tuesday only! Recheck in  3 weeks.

## 2012-09-12 NOTE — Patient Instructions (Signed)
The patient is instructed to continue all medications as prescribed. Schedule followup with check out clerk upon leaving the clinic  

## 2012-09-12 NOTE — Progress Notes (Signed)
Subjective:    Patient ID: [name], female    DOB: August 21, 1928, 79 y.o.   MRN: [id number]  HPI Patient has a complicated GI historyand that she had surgery for esophageal cancer with a small bowel pull-through. She recently developed severe stricture disease that resulted in significant nausea vomiting and weight loss and she underwent a stenting procedure which has significantly improved her life. However as a part of the post procedure planning the Reglan was discontinued and this has resulted in increased bile reflux especially at night.   Review of Systems  Constitutional: Negative for activity change, appetite change and fatigue.  HENT: Negative for ear pain, congestion, neck pain, postnasal drip and sinus pressure.   Eyes: Negative for redness and visual disturbance.  Respiratory: Positive for choking. Negative for cough, shortness of breath and wheezing.   Gastrointestinal: Positive for nausea. Negative for abdominal pain and abdominal distention.       Bile reflux with high risk of aspiration pneumonia  Genitourinary: Negative for dysuria, frequency and menstrual problem.  Musculoskeletal: Negative for myalgias, joint swelling and arthralgias.  Skin: Negative for rash and wound.  Neurological: Negative for dizziness, weakness and headaches.  Hematological: Negative for adenopathy. Does not bruise/bleed easily.  Psychiatric/Behavioral: Negative for sleep disturbance and decreased concentration.   Past Medical History  Diagnosis Date  . GERD (gastroesophageal reflux disease)   . Osteoporosis   . Postmenopausal HRT (hormone replacement therapy)   . Anemia   . Hypertension   . Allergy   . Asthma   . Shortness of breath   . PONV (postoperative nausea and vomiting)   . Pneumonia      Hx of pneumonia,64months ago  . Esophageal cancer     removed esophagus stomach pulled up    History   Social History  . Marital Status: Single    Spouse Name: N/A    Number of  Children: 1  . Years of Education: N/A   Occupational History  . Retired    Social History Main Topics  . Smoking status: Never Smoker   . Smokeless tobacco: Never Used  . Alcohol Use: No  . Drug Use: No  . Sexually Active: Yes   Other Topics Concern  . Not on file   Social History Narrative   DAILY CAFFEINE    USE    Past Surgical History  Procedure Laterality Date  . Left oophorectomy    . Esophageal removal of cancer,gastric ppull-thru 1993    . Cholecystectomy    . Rt arm fx    . Esophagogastroduodenoscopy  10/19/2011    Procedure: ESOPHAGOGASTRODUODENOSCOPY (EGD);  Surgeon: Rachael Fee, MD;  Location: [facility];  Service: Endoscopy;  Laterality: N/A;  . Balloon dilation  10/19/2011    Procedure: BALLOON DILATION;  Surgeon: Rachael Fee, MD;  Location: [facility];  Service: Endoscopy;  Laterality: N/A;  . Esophagogastroduodenoscopy  11/07/2011    Procedure: ESOPHAGOGASTRODUODENOSCOPY (EGD);  Surgeon: Meryl Dare, MD,FACG;  Location: [facility] ENDOSCOPY;  Service: Endoscopy;  Laterality: N/A;  . Esophagogastroduodenoscopy  11/22/2011    Procedure: ESOPHAGOGASTRODUODENOSCOPY (EGD);  Surgeon: Louis Meckel, MD;  Location: [facility];  Service: Endoscopy;  Laterality: N/A;  . Esophagogastroduodenoscopy  11/24/2011    Procedure: ESOPHAGOGASTRODUODENOSCOPY (EGD);  Surgeon: Louis Meckel, MD;  Location: [facility];  Service: Endoscopy;  Laterality: N/A;  with removable duodenal stent (actually using esophageal partially covered 23X15 located in locked supply room  . Duodenal stent placement  11/24/2011  Procedure: DUODENAL STENT PLACEMENT;  Surgeon: Louis Meckel, MD;  Location: [facility];  Service: Endoscopy;  Laterality: N/A;    Family History  Problem Relation Age of Onset  . Cervical cancer Mother   . Heart disease Father   . Coronary artery disease    . Hypotension Sister   . Colon cancer Neg Hx   . Colon polyps Sister   . Pancreatic cancer       1/2 sister    Allergies  Allergen Reactions  . Erythromycin Ethylsuccinate Other (See Comments)    REACTION: unspecified  . Prednisone Other (See Comments)    REACTION: unspecified    Current Outpatient Prescriptions on File Prior to Visit  Medication Sig Dispense Refill  . acetaminophen (TYLENOL) 325 MG tablet Take 2 tablets (650 mg total) by mouth every 6 (six) hours as needed (or Fever >/= 101).      Marland Kitchen albuterol (PROVENTIL HFA;VENTOLIN HFA) 108 (90 BASE) MCG/ACT inhaler Inhale 2 puffs into the lungs every 6 (six) hours as needed. For wheezing      . B Complex-C-Min-Fe-FA (HEMOCYTE PLUS) 106-1 MG CAPS Take 106 mg by mouth 2 (two) times daily.  180 each  3  . bisoprolol (ZEBETA) 5 MG tablet Take 0.5 tablets (2.5 mg total) by mouth daily.      . bisoprolol-hydrochlorothiazide (ZIAC) 2.5-6.25 MG per tablet Take 1 tablet by mouth daily.  90 tablet  3  . Calcium-Vitamin D-Vitamin K (VIACTIV) 500-100-40 MG-UNT-MCG CHEW Chew by mouth 3 (three) times daily.        . digoxin (LANOXIN) 0.125 MG tablet Take 1 tablet (0.125 mg total) by mouth daily.  90 tablet  3  . lansoprazole (PREVACID SOLUTAB) 30 MG disintegrating tablet Take 2 tablets (60 mg total) by mouth 2 (two) times daily.  360 tablet  3  . levocetirizine (XYZAL) 5 MG tablet Take 5 mg by mouth every evening. Patient takes only as needed      . loratadine (CLARITIN) 10 MG tablet Take 5 mg by mouth daily as needed.      . phenol (CHLORASEPTIC) 1.4 % LIQD Use as directed 2 sprays in the mouth or throat as needed.      . promethazine (PHENERGAN) 25 MG suppository Place 1 suppository (25 mg total) rectally every 6 (six) hours as needed.  36 each  3  . traMADol (ULTRAM) 50 MG tablet Take 1 tablet (50 mg total) by mouth every 8 (eight) hours as needed for pain.  30 tablet  5  . warfarin (COUMADIN) 5 MG tablet Take 1 tablet (5 mg total) by mouth daily.  100 tablet  3   No current facility-administered medications on file prior to visit.    BP  110/70  Pulse 72  Temp(Src) 98 F (36.7 C)  Resp 16  Ht 5\' 3"  (1.6 m)  Wt 118 lb (53.524 kg)  BMI 20.91 kg/m2       Objective:   Physical Exam  Constitutional: She is oriented to person, place, and time. She appears well-nourished.  Eyes: Conjunctivae are normal. Pupils are equal, round, and reactive to light.  Cardiovascular: Normal rate and regular rhythm.   Murmur heard. Pulmonary/Chest: She has rales.  Dullness at bases Rales one third up at the right base  Abdominal: Soft. Bowel sounds are normal.  Musculoskeletal:  kyphosis  Neurological: She is alert and oriented to person, place, and time.          Assessment & Plan:  We  will resume as a trial of Reglan 5 mg twice a day and see if the bile reflux response to that. She does have followup with gastroenterology planned but I doubt that the visit tomorrow will occur because of the weather and hopefully if we can reschedule her in one to 2 weeks we should see if the Reglan can eliminate bile reflux symptoms This decision as informed by the fact that she has had frequent aspiration pneumonias in the past even before she developed a stricture symptomology.  On physical examination she has findings that are consistent with early aspiration pneumonitis and will be treated with Ceftin for 10 day course

## 2012-09-13 ENCOUNTER — Ambulatory Visit: Payer: Medicare Other | Admitting: Gastroenterology

## 2012-09-27 ENCOUNTER — Ambulatory Visit (INDEPENDENT_AMBULATORY_CARE_PROVIDER_SITE_OTHER): Payer: Medicare Other | Admitting: Gastroenterology

## 2012-09-27 ENCOUNTER — Encounter: Payer: Self-pay | Admitting: Gastroenterology

## 2012-09-27 VITALS — BP 80/60 | HR 72 | Ht 60.5 in | Wt 124.0 lb

## 2012-09-27 DIAGNOSIS — K219 Gastro-esophageal reflux disease without esophagitis: Secondary | ICD-10-CM

## 2012-09-27 DIAGNOSIS — K311 Adult hypertrophic pyloric stenosis: Secondary | ICD-10-CM

## 2012-09-27 NOTE — Assessment & Plan Note (Signed)
Patent stent by last upper GI series.

## 2012-09-27 NOTE — Assessment & Plan Note (Signed)
Systems were clearly worsened after holding Carafate and Reglan. Plan to continue these medications realizing that she has a mild hand tremor which could be related to Reglan.

## 2012-09-27 NOTE — Patient Instructions (Addendum)
Follow up with Dr Arlyce Dice in 6 months

## 2012-09-27 NOTE — Progress Notes (Signed)
History of Present Illness:  [name] has returned for followup of nausea and regurgitation. She had discontinued Carafate and Reglan. She also had some vomiting and apparently aspirated requiring antibiotic therapy. Both medicines were resumed and upper GI symptoms have resolved. She is gaining weight and appetite is good. Reflux is fairly minimal.    Review of Systems: Pertinent positive and negative review of systems were noted in the above HPI section. All other review of systems were otherwise negative.    Current Medications, Allergies, Past Medical History, Past Surgical History, Family History and Social History were reviewed in [facility] electronic medical record  Vital signs were reviewed in today's medical record. Physical Exam: General: Well developed , well nourished, no acute distress

## 2012-10-02 ENCOUNTER — Encounter: Payer: Medicare Other | Admitting: Family

## 2012-10-03 ENCOUNTER — Ambulatory Visit (INDEPENDENT_AMBULATORY_CARE_PROVIDER_SITE_OTHER): Payer: Medicare Other | Admitting: Family

## 2012-10-03 DIAGNOSIS — I4891 Unspecified atrial fibrillation: Secondary | ICD-10-CM

## 2012-10-03 DIAGNOSIS — Z7901 Long term (current) use of anticoagulants: Secondary | ICD-10-CM

## 2012-10-03 NOTE — Patient Instructions (Addendum)
Hold Coumadin today nor Thursday. Continue Coumadin at 5mg  a day.  Except 7.5mg  on Tuesday only! Recheck in  2 weeks.   Anticoagulation Dose Instructions as of 10/03/2012     Glynis Smiles Tue Wed Thu Fri Sat   New Dose 5 mg 5 mg 7.5 mg 5 mg 5 mg 5 mg 5 mg    Description       Hold Coumadin today nor Thursday. Continue Coumadin at 5mg  a day.  Except 7.5mg  on Tuesday only! Recheck in  2 weeks.

## 2012-10-04 ENCOUNTER — Encounter: Payer: Medicare Other | Admitting: Family

## 2012-10-08 ENCOUNTER — Ambulatory Visit (INDEPENDENT_AMBULATORY_CARE_PROVIDER_SITE_OTHER): Payer: Medicare Other | Admitting: Internal Medicine

## 2012-10-08 ENCOUNTER — Encounter: Payer: Self-pay | Admitting: Internal Medicine

## 2012-10-08 VITALS — BP 104/68 | HR 68 | Temp 98.1°F | Wt 118.0 lb

## 2012-10-08 DIAGNOSIS — R05 Cough: Secondary | ICD-10-CM

## 2012-10-08 DIAGNOSIS — J690 Pneumonitis due to inhalation of food and vomit: Secondary | ICD-10-CM

## 2012-10-08 MED ORDER — HYDROCODONE-HOMATROPINE 5-1.5 MG/5ML PO SYRP: 5.0000 mL | ORAL_SOLUTION | Freq: Three times a day (TID) | ORAL | Status: DC | PRN

## 2012-10-08 MED ORDER — CEFUROXIME AXETIL 250 MG PO TABS: 250.0000 mg | ORAL_TABLET | Freq: Two times a day (BID) | ORAL | Status: DC

## 2012-10-08 NOTE — Progress Notes (Signed)
Subjective:    Patient ID: [name], female    DOB: 14-Oct-1928, 79 y.o.   MRN: [id number]  HPI One week history of worsening congestion and cough Has noted increased gerd symtoms Hx of esophgeal stricture Has resumed the reglan Increased cough worse a night   Review of Systems  Constitutional: Negative for activity change, appetite change and fatigue.  HENT: Positive for congestion, rhinorrhea and postnasal drip. Negative for ear pain, neck pain and sinus pressure.   Eyes: Negative for redness and visual disturbance.  Respiratory: Positive for choking, chest tightness, shortness of breath and wheezing. Negative for cough.   Gastrointestinal: Negative for abdominal pain and abdominal distention.  Genitourinary: Negative for dysuria, frequency and menstrual problem.  Musculoskeletal: Negative for myalgias, joint swelling and arthralgias.  Skin: Negative for rash and wound.  Neurological: Negative for dizziness, weakness and headaches.  Hematological: Negative for adenopathy. Does not bruise/bleed easily.  Psychiatric/Behavioral: Negative for sleep disturbance and decreased concentration.   Past Medical History  Diagnosis Date  . GERD (gastroesophageal reflux disease)   . Osteoporosis   . Postmenopausal HRT (hormone replacement therapy)   . Anemia   . Hypertension   . Allergy   . Asthma   . Shortness of breath   . PONV (postoperative nausea and vomiting)   . Pneumonia      Hx of pneumonia,58months ago  . Esophageal cancer     removed esophagus stomach pulled up    History   Social History  . Marital Status: Single    Spouse Name: N/A    Number of Children: 1  . Years of Education: N/A   Occupational History  . Retired    Social History Main Topics  . Smoking status: Never Smoker   . Smokeless tobacco: Never Used  . Alcohol Use: No  . Drug Use: No  . Sexually Active: Yes   Other Topics Concern  . Not on file   Social History Narrative   DAILY  CAFFEINE    USE    Past Surgical History  Procedure Laterality Date  . Left oophorectomy    . Esophageal removal of cancer,gastric ppull-thru 1993    . Cholecystectomy    . Rt arm fx    . Esophagogastroduodenoscopy  10/19/2011    Procedure: ESOPHAGOGASTRODUODENOSCOPY (EGD);  Surgeon: Rachael Fee, MD;  Location: [facility];  Service: Endoscopy;  Laterality: N/A;  . Balloon dilation  10/19/2011    Procedure: BALLOON DILATION;  Surgeon: Rachael Fee, MD;  Location: [facility];  Service: Endoscopy;  Laterality: N/A;  . Esophagogastroduodenoscopy  11/07/2011    Procedure: ESOPHAGOGASTRODUODENOSCOPY (EGD);  Surgeon: Meryl Dare, MD,FACG;  Location: [facility] ENDOSCOPY;  Service: Endoscopy;  Laterality: N/A;  . Esophagogastroduodenoscopy  11/22/2011    Procedure: ESOPHAGOGASTRODUODENOSCOPY (EGD);  Surgeon: Louis Meckel, MD;  Location: [facility];  Service: Endoscopy;  Laterality: N/A;  . Esophagogastroduodenoscopy  11/24/2011    Procedure: ESOPHAGOGASTRODUODENOSCOPY (EGD);  Surgeon: Louis Meckel, MD;  Location: [facility];  Service: Endoscopy;  Laterality: N/A;  with removable duodenal stent (actually using esophageal partially covered 23X15 located in locked supply room  . Duodenal stent placement  11/24/2011    Procedure: DUODENAL STENT PLACEMENT;  Surgeon: Louis Meckel, MD;  Location: [facility];  Service: Endoscopy;  Laterality: N/A;    Family History  Problem Relation Age of Onset  . Cervical cancer Mother   . Heart disease Father     MI  . Coronary artery disease Brother   .  Hypotension Sister   . Colon cancer Neg Hx   . Colon polyps Sister   . Pancreatic cancer      1/2 sister  . Coronary artery disease Sister     pacemaker    Allergies  Allergen Reactions  . Erythromycin Ethylsuccinate Other (See Comments)    REACTION: unspecified  . Prednisone Other (See Comments)    REACTION: unspecified    Current Outpatient Prescriptions on File Prior to Visit   Medication Sig Dispense Refill  . acetaminophen (TYLENOL) 325 MG tablet Take 2 tablets (650 mg total) by mouth every 6 (six) hours as needed (or Fever >/= 101).      Marland Kitchen albuterol (PROVENTIL HFA;VENTOLIN HFA) 108 (90 BASE) MCG/ACT inhaler Inhale 2 puffs into the lungs every 6 (six) hours as needed. For wheezing      . B Complex-C-Min-Fe-FA (HEMOCYTE PLUS) 106-1 MG CAPS Take 106 mg by mouth 2 (two) times daily.  180 each  3  . bisoprolol (ZEBETA) 5 MG tablet Take 0.5 tablets (2.5 mg total) by mouth daily.      . bisoprolol-hydrochlorothiazide (ZIAC) 2.5-6.25 MG per tablet Take 1 tablet by mouth daily.  90 tablet  3  . Calcium-Vitamin D-Vitamin K (VIACTIV) 500-100-40 MG-UNT-MCG CHEW Chew by mouth 3 (three) times daily.        Marland Kitchen CARAFATE 1 GM/10ML suspension Take 1 g by mouth 2 (two) times daily.       . digoxin (LANOXIN) 0.125 MG tablet Take 1 tablet (0.125 mg total) by mouth daily.  90 tablet  3  . lansoprazole (PREVACID SOLUTAB) 30 MG disintegrating tablet Take 2 tablets (60 mg total) by mouth 2 (two) times daily.  360 tablet  3  . levocetirizine (XYZAL) 5 MG tablet Take 5 mg by mouth every evening. Patient takes only as needed      . loratadine (CLARITIN) 10 MG tablet Take 5 mg by mouth daily as needed.      . metoCLOPramide (REGLAN) 5 MG tablet Take 1 tablet (5 mg total) by mouth 2 (two) times daily.  180 tablet  3  . phenol (CHLORASEPTIC) 1.4 % LIQD Use as directed 2 sprays in the mouth or throat as needed.      . promethazine (PHENERGAN) 25 MG suppository Place 1 suppository (25 mg total) rectally every 6 (six) hours as needed.  36 each  3  . traMADol (ULTRAM) 50 MG tablet Take 1 tablet (50 mg total) by mouth every 8 (eight) hours as needed for pain.  30 tablet  5  . Vitamin D, Ergocalciferol, (DRISDOL) 50000 UNITS CAPS Take 1 capsule (50,000 Units total) by mouth every 7 (seven) days. On Friday  30 capsule  3  . warfarin (COUMADIN) 5 MG tablet Take 1 tablet (5 mg total) by mouth daily.  100  tablet  3  . zolpidem (AMBIEN) 5 MG tablet Take 1 tablet (5 mg total) by mouth at bedtime as needed for sleep.  90 tablet  1   No current facility-administered medications on file prior to visit.    BP 104/68  Pulse 68  Temp(Src) 98.1 F (36.7 C) (Oral)  Wt 118 lb (53.524 kg)  BMI 22.66 kg/m2       Objective:   Physical Exam  Constitutional: She appears well-developed and well-nourished.  HENT:  Head: Normocephalic and atraumatic.  Eyes: Conjunctivae are normal. Pupils are equal, round, and reactive to light.  Cardiovascular: Normal rate and regular rhythm.   Pulmonary/Chest: She is in respiratory  distress. She has wheezes. She has rales. She exhibits no tenderness.  Skin: There is pallor.   And has rales in her left lower lung fields consistent with aspiration.  Manipulation of her symptomology in color sputum change her to treat this is an aspiration pneumonitis with possible early aspiration pneumonia involving anaerobic.  If she does not respond to the antibiotics and the next 3-4 days a chest x-ray should be obtained       Assessment & Plan:  Aspiration pneumonitis early aspiration pneumonia Omnicef 10 mg by mouth twice a day 10 days

## 2012-10-17 ENCOUNTER — Telehealth: Payer: Self-pay | Admitting: Internal Medicine

## 2012-10-17 ENCOUNTER — Ambulatory Visit (INDEPENDENT_AMBULATORY_CARE_PROVIDER_SITE_OTHER): Payer: Medicare Other | Admitting: Family Medicine

## 2012-10-17 ENCOUNTER — Ambulatory Visit (INDEPENDENT_AMBULATORY_CARE_PROVIDER_SITE_OTHER): Payer: Medicare Other | Admitting: Family

## 2012-10-17 ENCOUNTER — Ambulatory Visit (INDEPENDENT_AMBULATORY_CARE_PROVIDER_SITE_OTHER)
Admission: RE | Admit: 2012-10-17 | Discharge: 2012-10-17 | Disposition: A | Discharge status: Home / Self Care | Payer: Medicare Other | Source: Ambulatory Visit | Attending: Internal Medicine | Admitting: Internal Medicine

## 2012-10-17 ENCOUNTER — Encounter: Payer: Self-pay | Admitting: Family Medicine

## 2012-10-17 VITALS — BP 100/78 | Temp 98.5°F | Resp 12 | Wt 116.0 lb

## 2012-10-17 DIAGNOSIS — R042 Hemoptysis: Secondary | ICD-10-CM

## 2012-10-17 DIAGNOSIS — J189 Pneumonia, unspecified organism: Secondary | ICD-10-CM

## 2012-10-17 DIAGNOSIS — I4891 Unspecified atrial fibrillation: Secondary | ICD-10-CM

## 2012-10-17 DIAGNOSIS — Z7901 Long term (current) use of anticoagulants: Secondary | ICD-10-CM

## 2012-10-17 DIAGNOSIS — R05 Cough: Secondary | ICD-10-CM

## 2012-10-17 DIAGNOSIS — R059 Cough, unspecified: Secondary | ICD-10-CM

## 2012-10-17 DIAGNOSIS — R062 Wheezing: Secondary | ICD-10-CM

## 2012-10-17 MED ORDER — LEVOFLOXACIN 500 MG PO TABS: 500.0000 mg | ORAL_TABLET | Freq: Every day | ORAL | Status: AC

## 2012-10-17 MED ORDER — ALBUTEROL SULFATE HFA 108 (90 BASE) MCG/ACT IN AERS: 2.0000 | INHALATION_SPRAY | Freq: Four times a day (QID) | RESPIRATORY_TRACT | Status: DC | PRN

## 2012-10-17 NOTE — Patient Instructions (Signed)
Since you are on an antibiotic, take 1/2 tablet today only. Continue Coumadin at 5mg  a day.  Except 7.5mg  on Tuesday only! Recheck in  2 weeks.   Anticoagulation Dose Instructions as of 10/17/2012     Glynis Smiles Tue Wed Thu Fri Sat   New Dose 5 mg 5 mg 7.5 mg 5 mg 5 mg 5 mg 5 mg    Description       Since you are on an antibiotic, take 1/2 tablet today only. Continue Coumadin at 5mg  a day.  Except 7.5mg  on Tuesday only! Recheck in  2 weeks.

## 2012-10-17 NOTE — Telephone Encounter (Signed)
Spoke with patient and she has had her chest xray and will be here at the office at 4pm for a protime and office visit

## 2012-10-17 NOTE — Patient Instructions (Addendum)
Stop Ceftin Start Levaquin 500 mg once daily Continue with albuterol inhaler every 6 hours as needed for wheezing Follow up in 2 days to recheck and sooner for any fever or increased shortness of breath.

## 2012-10-17 NOTE — Telephone Encounter (Signed)
Patient Information:  Caller Name: [name]  Phone: [phone]  Patient: [name], [name]  Gender: Female  DOB: November 22, 1928  Age: 78 Years  PCP: [name] (Adults only)  Office Follow Up:  Does the office need to follow up with this patient?: Yes  Instructions For The Office: Sister [name], calling concerned about her sister [name].  She would like [name] to call her  RN Note:  Sister [name]  is very concerned about her sister [name].   She wants to pick her up and bring her to the hospital/ER  and her sister told her she has an appt today . She would like [name] to call [name] and talk with her. [phone]  . [name] (sister) [phone] Cell.  Symptoms  Reason For Call & Symptoms: Sister [name]  is calling about her sister [name].  She states that her sister cannot breathe. She has not seen Sister but has only talked to her on the phone.  She states that she has aspiration pneumonia and had an x-ray today and has an  appointment today at 16:30.  Sister is worried.  SHE WANTS [name] TO CALL HER AND CONVIENCE HER TO BE ADMITTED TO THE HOSPITAL  Reviewed Health History In EMR: N/A  Reviewed Medications In EMR: N/A  Reviewed Allergies In EMR: N/A  Reviewed Surgeries / Procedures: N/A  Date of Onset of Symptoms: 10/17/2012  Guideline(s) Used:  No Protocol Available - Information Only  Disposition Per Guideline:   Discuss with PCP and Callback by Nurse Today  Reason For Disposition Reached:   Nursing judgment  Advice Given:  Call Back If:  New symptoms develop  RN Overrode Recommendation:  Document Patient  Sister [name]  would like "[name]" to call her sister

## 2012-10-17 NOTE — Telephone Encounter (Signed)
Patient Information:  Caller Name: Jamison  Phone: [phone]  Patient: [name], [name]  Gender: Female  DOB: 1928/12/02  Age: 79 Years  PCP: [name] (Adults only)  Office Follow Up:  Does the office need to follow up with this patient?: No  Instructions For The Office: N/A   Symptoms  Reason For Call & Symptoms: Saw Dr. Lovell Sheehan on 10/08/2012 and was dx with pneumonia.  She is calling because she cannot stop coughing and she has a sore throat.  Reviewed Health History In EMR: Yes  Reviewed Medications In EMR: Yes  Reviewed Allergies In EMR: Yes  Reviewed Surgeries / Procedures: Yes  Date of Onset of Symptoms: 10/01/2012  Treatments Tried: Antibiotics and rx cough medicine  Treatments Tried Worked: No  Any Fever: Yes  Fever Taken: Oral  Fever Time Of Reading: 20:24:00  Fever Last Reading: 100.0  Guideline(s) Used:  Cough  Disposition Per Guideline:   Go to ED Now  Reason For Disposition Reached:   Difficulty breathing  Advice Given:  N/A  Patient Will Follow Care Advice:  YES

## 2012-10-17 NOTE — Progress Notes (Signed)
Subjective:    Patient ID: [name], female    DOB: 02-10-1929, 79 y.o.   MRN: [id number]  HPI  Patient is seen chief complaint of persistent cough for one month and hemoptysis past few days. She has chronic problems including a history of esophageal cancer, GERD, hypertension, asthmatic bronchitis, gastric outlet obstruction with recent duodenal stent, and atrial fibrillation on Coumadin.  She was seen with respiratory illness and treated with antibiotics back in February. Improved somewhat but then returned with increased cough and was started on Ceftin and the 10th of this month. She has not taken any Ceftin past couple days because of some nausea which she attributed to Ceftin Denies fever. She has some mild dyspnea over baseline. Some increased wheezing. No pleuritic pain. Has previously taken albuterol but currently out. She's been intolerant of oral prednisone in the past.  Denies abdominal pain. No hematemesis. Denies recent appetite change other than with recent illness. 2 pound weight loss since February.   no epistaxis.   Had chest x-ray earlier today which showed large caliber esophageal stent and chronic cardiomegaly. Chronic basilar lung markings slightly more prominent than last year with possibility of mild basilar pneumonia  Past Medical History  Diagnosis Date  . GERD (gastroesophageal reflux disease)   . Osteoporosis   . Postmenopausal HRT (hormone replacement therapy)   . Anemia   . Hypertension   . Allergy   . Asthma   . Shortness of breath   . PONV (postoperative nausea and vomiting)   . Pneumonia      Hx of pneumonia,54months ago  . Esophageal cancer     removed esophagus stomach pulled up   Past Surgical History  Procedure Laterality Date  . Left oophorectomy    . Esophageal removal of cancer,gastric ppull-thru 1993    . Cholecystectomy    . Rt arm fx    . Esophagogastroduodenoscopy  10/19/2011    Procedure: ESOPHAGOGASTRODUODENOSCOPY (EGD);   Surgeon: Rachael Fee, MD;  Location: [facility];  Service: Endoscopy;  Laterality: N/A;  . Balloon dilation  10/19/2011    Procedure: BALLOON DILATION;  Surgeon: Rachael Fee, MD;  Location: [facility];  Service: Endoscopy;  Laterality: N/A;  . Esophagogastroduodenoscopy  11/07/2011    Procedure: ESOPHAGOGASTRODUODENOSCOPY (EGD);  Surgeon: Meryl Dare, MD,FACG;  Location: [facility] ENDOSCOPY;  Service: Endoscopy;  Laterality: N/A;  . Esophagogastroduodenoscopy  11/22/2011    Procedure: ESOPHAGOGASTRODUODENOSCOPY (EGD);  Surgeon: Louis Meckel, MD;  Location: [facility];  Service: Endoscopy;  Laterality: N/A;  . Esophagogastroduodenoscopy  11/24/2011    Procedure: ESOPHAGOGASTRODUODENOSCOPY (EGD);  Surgeon: Louis Meckel, MD;  Location: [facility];  Service: Endoscopy;  Laterality: N/A;  with removable duodenal stent (actually using esophageal partially covered 23X15 located in locked supply room  . Duodenal stent placement  11/24/2011    Procedure: DUODENAL STENT PLACEMENT;  Surgeon: Louis Meckel, MD;  Location: [facility];  Service: Endoscopy;  Laterality: N/A;    reports that she has never smoked. She has never used smokeless tobacco. She reports that she does not drink alcohol or use illicit drugs. family history includes Cervical cancer in her mother; Colon polyps in her sister; Coronary artery disease in her brother and sister; Heart disease in her father; Hypotension in her sister; and Pancreatic cancer in an unspecified family member.  There is no history of Colon cancer. Allergies  Allergen Reactions  . Erythromycin Ethylsuccinate Other (See Comments)    REACTION: unspecified  . Prednisone Other (See  Comments)    REACTION: unspecified       Review of Systems  Constitutional: Positive for fatigue. Negative for fever and chills.  HENT: Negative for nosebleeds.   Respiratory: Positive for cough, shortness of breath and wheezing.   Cardiovascular: Negative for chest  pain, palpitations and leg swelling.  Gastrointestinal: Positive for nausea. Negative for vomiting, abdominal pain, diarrhea and blood in stool.  Neurological: Negative for dizziness.       Objective:   Physical Exam  Constitutional: She appears well-developed and well-nourished.  HENT:  Right Ear: External ear normal.  Left Ear: External ear normal.  Mouth/Throat: Oropharynx is clear and moist.  Neck: Neck supple.  Cardiovascular: Normal rate.   Pulmonary/Chest:  Patient has some diffuse expiratory wheezes. Question faint rales left base. No respiratory distress. No retractions  Musculoskeletal: She exhibits no edema.  Lymphadenopathy:    She has no cervical adenopathy.  Neurological: She is alert.          Assessment & Plan  Patient presents with a cough associated with wheezing and some recent hemoptysis. She is on chronic Coumadin with INR 3.0 today. She does not have any fever but question of basilar infiltrates on chest x-ray above versus chronic markings. No respiratory distress. Possible nausea with current antibiotic. Check CBC. Discontinue Ceftin. Start Levaquin 500 milligrams once daily. Albuterol nebulizer given and patient felt better afterwards. Refill Proventil for her as needed use at home. Discussed possible steroids but she's been intolerant of oral steroids previously

## 2012-10-17 NOTE — Telephone Encounter (Signed)
Per Dr Lovell Sheehan, patient should have a chest xray now and follow up with an office visit.  Patient is aware and an appointment made.

## 2012-10-18 LAB — CBC WITH DIFFERENTIAL/PLATELET
Basophils Relative: 0.3 % (ref 0.0–3.0)
Eosinophils Relative: 0.2 % (ref 0.0–5.0)
HCT: 41.5 % (ref 36.0–46.0)
Hemoglobin: 13.2 g/dL (ref 12.0–15.0)
Lymphs Abs: 2.3 10*3/uL (ref 0.7–4.0)
MCV: 82.1 fl (ref 78.0–100.0)
Monocytes Absolute: 1 10*3/uL (ref 0.1–1.0)
RBC: 5.05 Mil/uL (ref 3.87–5.11)
WBC: 17.2 10*3/uL — ABNORMAL HIGH (ref 4.5–10.5)

## 2012-10-19 ENCOUNTER — Encounter: Payer: Self-pay | Admitting: Family Medicine

## 2012-10-19 ENCOUNTER — Ambulatory Visit (INDEPENDENT_AMBULATORY_CARE_PROVIDER_SITE_OTHER): Payer: Medicare Other | Admitting: Family Medicine

## 2012-10-19 VITALS — BP 100/64 | HR 76 | Temp 98.3°F | Wt 121.0 lb

## 2012-10-19 DIAGNOSIS — R062 Wheezing: Secondary | ICD-10-CM

## 2012-10-19 DIAGNOSIS — J189 Pneumonia, unspecified organism: Secondary | ICD-10-CM

## 2012-10-19 MED ORDER — METHYLPREDNISOLONE ACETATE 80 MG/ML IJ SUSP: 80.0000 mg | Freq: Once | INTRAMUSCULAR | Status: DC

## 2012-10-19 NOTE — Progress Notes (Signed)
Subjective:    Patient ID: [name], female    DOB: 09-01-1928, 79 y.o.   MRN: [id number]  HPI Patient seen for followup regarding cough and hemoptysis (chronic coumadin use with recent INR 3.0) Refer to prior note. Chest x-ray revealed possible basilar pneumonia. She had evidence for some reactive airway issues. We refill her albuterol and gave albuterol nebulizer here. She was switched to Levaquin 500 mg once daily. Denies fever. Overall improving. No further hemoptysis. Patient takes chronic Coumadin with recent INR 3.0  Patient is nonsmoker. No recent pleuritic pain. No dyspnea at rest. No nausea or vomiting.  Recent CBC white count 17,000  Past Medical History  Diagnosis Date  . GERD (gastroesophageal reflux disease)   . Osteoporosis   . Postmenopausal HRT (hormone replacement therapy)   . Anemia   . Hypertension   . Allergy   . Asthma   . Shortness of breath   . PONV (postoperative nausea and vomiting)   . Pneumonia      Hx of pneumonia,25months ago  . Esophageal cancer     removed esophagus stomach pulled up   Past Surgical History  Procedure Laterality Date  . Left oophorectomy    . Esophageal removal of cancer,gastric ppull-thru 1993    . Cholecystectomy    . Rt arm fx    . Esophagogastroduodenoscopy  10/19/2011    Procedure: ESOPHAGOGASTRODUODENOSCOPY (EGD);  Surgeon: Rachael Fee, MD;  Location: [facility];  Service: Endoscopy;  Laterality: N/A;  . Balloon dilation  10/19/2011    Procedure: BALLOON DILATION;  Surgeon: Rachael Fee, MD;  Location: [facility];  Service: Endoscopy;  Laterality: N/A;  . Esophagogastroduodenoscopy  11/07/2011    Procedure: ESOPHAGOGASTRODUODENOSCOPY (EGD);  Surgeon: Meryl Dare, MD,FACG;  Location: [facility] ENDOSCOPY;  Service: Endoscopy;  Laterality: N/A;  . Esophagogastroduodenoscopy  11/22/2011    Procedure: ESOPHAGOGASTRODUODENOSCOPY (EGD);  Surgeon: Louis Meckel, MD;  Location: [facility];  Service: Endoscopy;   Laterality: N/A;  . Esophagogastroduodenoscopy  11/24/2011    Procedure: ESOPHAGOGASTRODUODENOSCOPY (EGD);  Surgeon: Louis Meckel, MD;  Location: [facility];  Service: Endoscopy;  Laterality: N/A;  with removable duodenal stent (actually using esophageal partially covered 23X15 located in locked supply room  . Duodenal stent placement  11/24/2011    Procedure: DUODENAL STENT PLACEMENT;  Surgeon: Louis Meckel, MD;  Location: [facility];  Service: Endoscopy;  Laterality: N/A;    reports that she has never smoked. She has never used smokeless tobacco. She reports that she does not drink alcohol or use illicit drugs. family history includes Cervical cancer in her mother; Colon polyps in her sister; Coronary artery disease in her brother and sister; Heart disease in her father; Hypotension in her sister; and Pancreatic cancer in an unspecified family member.  There is no history of Colon cancer. Allergies  Allergen Reactions  . Erythromycin Ethylsuccinate Other (See Comments)    REACTION: unspecified  . Prednisone Other (See Comments)    REACTION: unspecified      Review of Systems  Constitutional: Negative for fever and chills.  Respiratory: Positive for cough and wheezing.   Cardiovascular: Negative for chest pain, palpitations and leg swelling.  Gastrointestinal: Negative for nausea and vomiting.  Neurological: Negative for dizziness and syncope.  Hematological: Does not bruise/bleed easily.  Psychiatric/Behavioral: Negative for confusion.       Objective:   Physical Exam  Constitutional: She appears well-developed and well-nourished.  Cardiovascular: Normal rate.   Pulmonary/Chest:  Patient has some diffuse  wheezes but normal respiratory rate and no respiratory distress. No rales. No retractions  Musculoskeletal: She exhibits no edema.  Neurological: She is alert.          Assessment & Plan:  Cough with reactive airway component and possible basilar pneumonia.  Clinically improved. Pulse oximetry 98%. Afebrile. Finish out Levaquin. Continue albuterol as needed. Depo-Medrol 80 mg IM given. She's been intolerant of oral steroids in the past

## 2012-10-19 NOTE — Progress Notes (Signed)
Quick Note:  Pt coming in today for 2 day follow-up ______

## 2012-10-19 NOTE — Patient Instructions (Addendum)
Continue with albuterol as needed Finish out antibiotic Follow up promptly for any fever or increased shortness of breath.

## 2012-11-01 ENCOUNTER — Ambulatory Visit (INDEPENDENT_AMBULATORY_CARE_PROVIDER_SITE_OTHER): Payer: Medicare Other | Admitting: Family

## 2012-11-01 DIAGNOSIS — Z7901 Long term (current) use of anticoagulants: Secondary | ICD-10-CM

## 2012-11-01 DIAGNOSIS — I4891 Unspecified atrial fibrillation: Secondary | ICD-10-CM

## 2012-11-01 NOTE — Patient Instructions (Addendum)
Hold Coumadin today, then continue at 5mg  a day. Recheck in  2 weeks  Anticoagulation Dose Instructions as of 11/01/2012     Glynis Smiles Tue Wed Thu Fri Sat   New Dose 5 mg 5 mg 5 mg 5 mg 5 mg 5 mg 5 mg    Description       Hold Coumadin today, then continue at 5mg  a day. Recheck in  2 weeks.

## 2012-11-15 ENCOUNTER — Ambulatory Visit (INDEPENDENT_AMBULATORY_CARE_PROVIDER_SITE_OTHER): Payer: Medicare Other | Admitting: Family

## 2012-11-15 DIAGNOSIS — I4891 Unspecified atrial fibrillation: Secondary | ICD-10-CM

## 2012-11-15 DIAGNOSIS — Z7901 Long term (current) use of anticoagulants: Secondary | ICD-10-CM

## 2012-11-15 LAB — POCT INR: INR: 2.7

## 2012-11-15 NOTE — Patient Instructions (Addendum)
Continue at 5mg  a day. Recheck in  3 weeks.   Anticoagulation Dose Instructions as of 11/15/2012     [name] Tue Wed Thu Fri Sat   New Dose 5 mg 5 mg 5 mg 5 mg 5 mg 5 mg 5 mg    Description       Continue at 5mg  a day. Recheck in  3 weeks.

## 2012-11-19 ENCOUNTER — Encounter: Payer: Self-pay | Admitting: Gastroenterology

## 2012-12-06 ENCOUNTER — Ambulatory Visit (INDEPENDENT_AMBULATORY_CARE_PROVIDER_SITE_OTHER): Payer: Medicare Other | Admitting: Family

## 2012-12-06 DIAGNOSIS — I4891 Unspecified atrial fibrillation: Secondary | ICD-10-CM

## 2012-12-06 DIAGNOSIS — Z7901 Long term (current) use of anticoagulants: Secondary | ICD-10-CM

## 2012-12-06 NOTE — Patient Instructions (Addendum)
Continue at 5mg  a day. Recheck in  4 weeks.  Anticoagulation Dose Instructions as of 12/06/2012     [name] Tue Wed Thu Fri Sat   New Dose 5 mg 5 mg 5 mg 5 mg 5 mg 5 mg 5 mg    Description       Continue at 5mg  a day. Recheck in  4 weeks.

## 2012-12-21 ENCOUNTER — Encounter: Payer: Self-pay | Admitting: Internal Medicine

## 2012-12-21 ENCOUNTER — Ambulatory Visit (INDEPENDENT_AMBULATORY_CARE_PROVIDER_SITE_OTHER): Payer: Medicare Other | Admitting: Internal Medicine

## 2012-12-21 VITALS — BP 110/70 | HR 72 | Temp 98.6°F | Resp 16 | Ht 60.5 in | Wt 118.0 lb

## 2012-12-21 DIAGNOSIS — J441 Chronic obstructive pulmonary disease with (acute) exacerbation: Secondary | ICD-10-CM

## 2012-12-21 DIAGNOSIS — I10 Essential (primary) hypertension: Secondary | ICD-10-CM

## 2012-12-21 DIAGNOSIS — D509 Iron deficiency anemia, unspecified: Secondary | ICD-10-CM

## 2012-12-21 DIAGNOSIS — J690 Pneumonitis due to inhalation of food and vomit: Secondary | ICD-10-CM

## 2012-12-21 DIAGNOSIS — R05 Cough: Secondary | ICD-10-CM

## 2012-12-21 MED ORDER — MOXIFLOXACIN HCL 400 MG PO TABS: 400.0000 mg | ORAL_TABLET | Freq: Every day | ORAL | Status: DC

## 2012-12-21 MED ORDER — MUCINEX DM 30-600 MG PO TB12: 1.0000 | ORAL_TABLET | Freq: Two times a day (BID) | ORAL | Status: DC

## 2012-12-21 MED ORDER — WARFARIN SODIUM 5 MG PO TABS: 5.0000 mg | ORAL_TABLET | Freq: Every day | ORAL | Status: DC

## 2012-12-21 MED ORDER — DIGOXIN 125 MCG PO TABS: 0.1250 mg | ORAL_TABLET | Freq: Every day | ORAL | Status: DC

## 2012-12-21 MED ORDER — SUCRALFATE 1 GM/10ML PO SUSP: 1.0000 g | Freq: Two times a day (BID) | ORAL | Status: DC

## 2012-12-21 NOTE — Patient Instructions (Signed)
Compete all antiobiotice and call office to report cough early next week

## 2012-12-21 NOTE — Progress Notes (Signed)
Subjective:    Patient ID: [name], female    DOB: 1928-11-13, 79 y.o.   MRN: [id number]  HPI Patient is an 79 year old female with a history of esophageal cancer with a small bowel pull-through chronic reflux and multiple episodes of aspiration pneumonia and pneumonitis.  She presents with a complicated recent history of asthmatic bronchitis with frequent flares. She has been treated twice for possible aspiration pneumonia she presents today with chest congestion and cough that has not improved over the course of treatment for these conditions.  Cough productive with some dark sputum No resting due to cough The prevacid is not fully controlling heartburn    Review of Systems  Constitutional:       Fragile appearing  HENT: Negative.   Respiratory: Positive for cough, shortness of breath and wheezing.   Cardiovascular: Positive for chest pain.  Genitourinary: Negative.   Neurological: Positive for dizziness and light-headedness.  Psychiatric/Behavioral: Negative.    Past Medical History  Diagnosis Date  . GERD (gastroesophageal reflux disease)   . Osteoporosis   . Postmenopausal HRT (hormone replacement therapy)   . Anemia   . Hypertension   . Allergy   . Asthma   . Shortness of breath   . PONV (postoperative nausea and vomiting)   . Pneumonia      Hx of pneumonia,18months ago  . Esophageal cancer     removed esophagus stomach pulled up    History   Social History  . Marital Status: Single    Spouse Name: N/A    Number of Children: 1  . Years of Education: N/A   Occupational History  . Retired    Social History Main Topics  . Smoking status: Never Smoker   . Smokeless tobacco: Never Used  . Alcohol Use: No  . Drug Use: No  . Sexually Active: Yes   Other Topics Concern  . Not on file   Social History Narrative   DAILY CAFFEINE    USE    Past Surgical History  Procedure Laterality Date  . Left oophorectomy    . Esophageal removal of  cancer,gastric ppull-thru 1993    . Cholecystectomy    . Rt arm fx    . Esophagogastroduodenoscopy  10/19/2011    Procedure: ESOPHAGOGASTRODUODENOSCOPY (EGD);  Surgeon: Rachael Fee, MD;  Location: [facility];  Service: Endoscopy;  Laterality: N/A;  . Balloon dilation  10/19/2011    Procedure: BALLOON DILATION;  Surgeon: Rachael Fee, MD;  Location: [facility];  Service: Endoscopy;  Laterality: N/A;  . Esophagogastroduodenoscopy  11/07/2011    Procedure: ESOPHAGOGASTRODUODENOSCOPY (EGD);  Surgeon: Meryl Dare, MD,FACG;  Location: [facility] ENDOSCOPY;  Service: Endoscopy;  Laterality: N/A;  . Esophagogastroduodenoscopy  11/22/2011    Procedure: ESOPHAGOGASTRODUODENOSCOPY (EGD);  Surgeon: Louis Meckel, MD;  Location: [facility];  Service: Endoscopy;  Laterality: N/A;  . Esophagogastroduodenoscopy  11/24/2011    Procedure: ESOPHAGOGASTRODUODENOSCOPY (EGD);  Surgeon: Louis Meckel, MD;  Location: [facility];  Service: Endoscopy;  Laterality: N/A;  with removable duodenal stent (actually using esophageal partially covered 23X15 located in locked supply room  . Duodenal stent placement  11/24/2011    Procedure: DUODENAL STENT PLACEMENT;  Surgeon: Louis Meckel, MD;  Location: [facility] END[facility];  Service: Endoscopy;  Laterality: N/A;    Family History  Problem Relation Age of Onset  . Cervical cancer Mother   . Heart disease Father     MI  . Coronary artery disease Brother   . Hypotension  Sister   . Colon cancer Neg Hx   . Colon polyps Sister   . Pancreatic cancer      1/2 sister  . Coronary artery disease Sister     pacemaker    Allergies  Allergen Reactions  . Erythromycin Ethylsuccinate Other (See Comments)    REACTION: unspecified  . Prednisone Other (See Comments)    REACTION: unspecified    Current Outpatient Prescriptions on File Prior to Visit  Medication Sig Dispense Refill  . albuterol (PROVENTIL HFA;VENTOLIN HFA) 108 (90 BASE) MCG/ACT inhaler Inhale 2 puffs into  the lungs every 6 (six) hours as needed. For wheezing  1 Inhaler  1  . B Complex-C-Min-Fe-FA (HEMOCYTE PLUS) 106-1 MG CAPS Take 106 mg by mouth 2 (two) times daily.  180 each  3  . bisoprolol-hydrochlorothiazide (ZIAC) 2.5-6.25 MG per tablet Take 1 tablet by mouth daily.  90 tablet  3  . Calcium-Vitamin D-Vitamin K (VIACTIV) 500-100-40 MG-UNT-MCG CHEW Chew by mouth 3 (three) times daily.        . cefUROXime (CEFTIN) 250 MG tablet Take 1 tablet (250 mg total) by mouth 2 (two) times daily.  20 tablet  0  . HYDROcodone-homatropine (HYCODAN) 5-1.5 MG/5ML syrup Take 5 mLs by mouth every 8 (eight) hours as needed for cough.  120 mL  2  . lansoprazole (PREVACID SOLUTAB) 30 MG disintegrating tablet Take 2 tablets (60 mg total) by mouth 2 (two) times daily.  360 tablet  3  . levocetirizine (XYZAL) 5 MG tablet Take 5 mg by mouth every evening. Patient takes only as needed      . metoCLOPramide (REGLAN) 5 MG tablet Take 1 tablet (5 mg total) by mouth 2 (two) times daily.  180 tablet  3  . PATADAY 0.2 % SOLN       . phenol (CHLORASEPTIC) 1.4 % LIQD Use as directed 2 sprays in the mouth or throat as needed.      . promethazine (PHENERGAN) 25 MG suppository Place 1 suppository (25 mg total) rectally every 6 (six) hours as needed.  36 each  3  . traMADol (ULTRAM) 50 MG tablet Take 1 tablet (50 mg total) by mouth every 8 (eight) hours as needed for pain.  30 tablet  5  . Vitamin D, Ergocalciferol, (DRISDOL) 50000 UNITS CAPS Take 1 capsule (50,000 Units total) by mouth every 7 (seven) days. On Friday  30 capsule  3  . zolpidem (AMBIEN) 5 MG tablet Take 1 tablet (5 mg total) by mouth at bedtime as needed for sleep.  90 tablet  1  . bisoprolol (ZEBETA) 5 MG tablet Take 0.5 tablets (2.5 mg total) by mouth daily.      Marland Kitchen loratadine (CLARITIN) 10 MG tablet Take 5 mg by mouth daily as needed.       No current facility-administered medications on file prior to visit.    BP 110/70  Pulse 72  Temp(Src) 98.6 F (37 C)   Resp 16  Ht 5' 0.5" (1.537 m)  Wt 118 lb (53.524 kg)  BMI 22.66 kg/m2       Objective:   Physical Exam  Vitals reviewed. Constitutional: She is oriented to person, place, and time. She appears distressed.  HENT:  Head: Normocephalic.  Eyes: Conjunctivae are normal. Pupils are equal, round, and reactive to light.  Neck:  Surgical changes  Cardiovascular:  Murmur heard. Pulmonary/Chest: She has wheezes. She has no rales.  Abdominal: Soft. Bowel sounds are normal.  Neurological: She is alert and  oriented to person, place, and time.  Skin: She is not diaphoretic.          Assessment & Plan:   Persistant. congestion in the left upper lobe High aspiration risks Last treatment rocepin and oral    Avelox for  10 days mucinex dm

## 2012-12-27 ENCOUNTER — Telehealth: Payer: Self-pay | Admitting: Internal Medicine

## 2012-12-27 ENCOUNTER — Ambulatory Visit (INDEPENDENT_AMBULATORY_CARE_PROVIDER_SITE_OTHER): Payer: Medicare Other | Admitting: Family Medicine

## 2012-12-27 ENCOUNTER — Encounter: Payer: Self-pay | Admitting: Family Medicine

## 2012-12-27 VITALS — BP 110/86 | Temp 97.8°F | Wt 119.0 lb

## 2012-12-27 DIAGNOSIS — J441 Chronic obstructive pulmonary disease with (acute) exacerbation: Secondary | ICD-10-CM

## 2012-12-27 DIAGNOSIS — R062 Wheezing: Secondary | ICD-10-CM

## 2012-12-27 DIAGNOSIS — R05 Cough: Secondary | ICD-10-CM

## 2012-12-27 MED ORDER — ALBUTEROL SULFATE (2.5 MG/3ML) 0.083% IN NEBU
2.5000 mg | INHALATION_SOLUTION | RESPIRATORY_TRACT | Status: AC
  Administered 2012-12-27: 2.5 mg via RESPIRATORY_TRACT

## 2012-12-27 MED ORDER — ALBUTEROL SULFATE HFA 108 (90 BASE) MCG/ACT IN AERS: 2.0000 | INHALATION_SPRAY | Freq: Four times a day (QID) | RESPIRATORY_TRACT | Status: DC | PRN

## 2012-12-27 MED ORDER — METHYLPREDNISOLONE ACETATE 80 MG/ML IJ SUSP
80.0000 mg | Freq: Once | INTRAMUSCULAR | Status: AC
  Administered 2012-12-27: 80 mg via INTRAMUSCULAR

## 2012-12-27 NOTE — Progress Notes (Signed)
Chief Complaint  Patient presents with  . Cough    not getting better  . Hemoptysis    HPI:  Acute visit for chronic cough, nasal congestion, hemoptysis, SOB, wheezing: -followed closely for this by PCP for several months  - per PCP notes, complicated hx of esophageal cancer with a small bowel pull-through chronic reflux and multiple episodes of aspiration pneumonia and pneumonitis and recent history of asthmatic bronchitis with frequent flares. -she also is on chronic coumadin, most recent INR 2.2 -saw PCP last week and started on another course of abx (10 day course of avelox) for aspiration pna, COPD exacerbation - on day 5/10 -per notes has not tolerated oral or inhaled steroids in the past, has responded well to nebulizer tx and IM steroids per notes from March for the same symptoms -pt reports episodes nasal congestion, yellow thick mucus with dried blood, coughing continuously, wheezing, SOB -denies: fevers, chills, vomiting -she reports she saw Dr. Delford Field a long time ago for lung issues and recurrent pneumonia and she reports she would like to see pulmonology again -she also reports back in march when this happened IM steroids seemed to help -reports abuterol helped in the past but has run out of this  ROS: See pertinent positives and negatives per HPI.  Past Medical History  Diagnosis Date  . GERD (gastroesophageal reflux disease)   . Osteoporosis   . Postmenopausal HRT (hormone replacement therapy)   . Anemia   . Hypertension   . Allergy   . Asthma   . Shortness of breath   . PONV (postoperative nausea and vomiting)   . Pneumonia      Hx of pneumonia,70months ago  . Esophageal cancer     removed esophagus stomach pulled up    Family History  Problem Relation Age of Onset  . Cervical cancer Mother   . Heart disease Father     MI  . Coronary artery disease Brother   . Hypotension Sister   . Colon cancer Neg Hx   . Colon polyps Sister   . Pancreatic cancer       1/2 sister  . Coronary artery disease Sister     pacemaker    History   Social History  . Marital Status: Single    Spouse Name: N/A    Number of Children: 1  . Years of Education: N/A   Occupational History  . Retired    Social History Main Topics  . Smoking status: Never Smoker   . Smokeless tobacco: Never Used  . Alcohol Use: No  . Drug Use: No  . Sexually Active: Yes   Other Topics Concern  . None   Social History Narrative   DAILY CAFFEINE    USE    Current outpatient prescriptions:albuterol (PROVENTIL HFA;VENTOLIN HFA) 108 (90 BASE) MCG/ACT inhaler, Inhale 2 puffs into the lungs every 6 (six) hours as needed. For wheezing, Disp: 1 Inhaler, Rfl: 1;  B Complex-C-Min-Fe-FA (HEMOCYTE PLUS) 106-1 MG CAPS, Take 106 mg by mouth 2 (two) times daily., Disp: 180 each, Rfl: 3;  bisoprolol-hydrochlorothiazide (ZIAC) 2.5-6.25 MG per tablet, Take 1 tablet by mouth daily., Disp: 90 tablet, Rfl: 3 Calcium-Vitamin D-Vitamin K (VIACTIV) 500-100-40 MG-UNT-MCG CHEW, Chew by mouth 3 (three) times daily.  , Disp: , Rfl: ;  Dextromethorphan-Guaifenesin (MUCINEX DM) 30-600 MG TB12, Take 1 tablet by mouth 2 (two) times daily., Disp: 28 each, Rfl: 0;  digoxin (LANOXIN) 0.125 MG tablet, Take 1 tablet (0.125 mg total) by mouth daily.,  Disp: 90 tablet, Rfl: 3 HYDROcodone-homatropine (HYCODAN) 5-1.5 MG/5ML syrup, Take 5 mLs by mouth every 8 (eight) hours as needed for cough., Disp: 120 mL, Rfl: 2;  lansoprazole (PREVACID SOLUTAB) 30 MG disintegrating tablet, Take 2 tablets (60 mg total) by mouth 2 (two) times daily., Disp: 360 tablet, Rfl: 3;  levocetirizine (XYZAL) 5 MG tablet, Take 5 mg by mouth every evening. Patient takes only as needed, Disp: , Rfl:  metoCLOPramide (REGLAN) 5 MG tablet, Take 1 tablet (5 mg total) by mouth 2 (two) times daily., Disp: 180 tablet, Rfl: 3;  moxifloxacin (AVELOX) 400 MG tablet, Take 1 tablet (400 mg total) by mouth daily., Disp: 10 tablet, Rfl: 0;  PATADAY 0.2 % SOLN, ,  Disp: , Rfl: ;  phenol (CHLORASEPTIC) 1.4 % LIQD, Use as directed 2 sprays in the mouth or throat as needed., Disp: , Rfl:  promethazine (PHENERGAN) 25 MG suppository, Place 1 suppository (25 mg total) rectally every 6 (six) hours as needed., Disp: 36 each, Rfl: 3;  sucralfate (CARAFATE) 1 GM/10ML suspension, Take 10 mLs (1 g total) by mouth 2 (two) times daily., Disp: 1800 mL, Rfl: 3;  traMADol (ULTRAM) 50 MG tablet, Take 1 tablet (50 mg total) by mouth every 8 (eight) hours as needed for pain., Disp: 30 tablet, Rfl: 5 Vitamin D, Ergocalciferol, (DRISDOL) 50000 UNITS CAPS, Take 1 capsule (50,000 Units total) by mouth every 7 (seven) days. On Friday, Disp: 30 capsule, Rfl: 3;  warfarin (COUMADIN) 5 MG tablet, Take 1 tablet (5 mg total) by mouth daily., Disp: 100 tablet, Rfl: 3;  zolpidem (AMBIEN) 5 MG tablet, Take 1 tablet (5 mg total) by mouth at bedtime as needed for sleep., Disp: 90 tablet, Rfl: 1 bisoprolol (ZEBETA) 5 MG tablet, Take 0.5 tablets (2.5 mg total) by mouth daily., Disp: , Rfl: ;  loratadine (CLARITIN) 10 MG tablet, Take 5 mg by mouth daily as needed., Disp: , Rfl:  Current facility-administered medications:albuterol (PROVENTIL) (2.5 MG/3ML) 0.083% nebulizer solution 2.5 mg, 2.5 mg, Nebulization, NOW, Terressa Koyanagi, DO  EXAM:  Filed Vitals:   12/27/12 1057  BP: 110/86  Temp: 97.8 F (36.6 C)    Body mass index is 22.85 kg/(m^2).  GENERAL: vitals reviewed and listed above, alert, oriented, appears well hydrated and in no acute distress  HEENT: atraumatic, conjunttiva clear, no obvious abnormalities on inspection of external nose and ears, normal appearance of ear canals and TMs, clear nasal congestion, irritation of nasal mucosa with small amount of dried blood, mild post oropharyngeal erythema with PND, no tonsillar edema or exudate, no sinus TTP  NECK: no obvious masses on inspection  LUNGS: difuse exp wheeze, no signs of resp distress, O2 sats 95% on RA. Wheezing improved  after neb tx.  CV: HRRR, no peripheral edema  MS: moves all extremities without noticeable abnormality  PSYCH: pleasant and cooperative, no obvious depression or anxiety  ASSESSMENT AND PLAN:  Discussed the following assessment and plan:  Bronchitis, chronic obstructive, with exacerbation - Plan: Ambulatory referral to Pulmonology, albuterol (PROVENTIL) (2.5 MG/3ML) 0.083% nebulizer solution 2.5 mg, albuterol (PROVENTIL HFA;VENTOLIN HFA) 108 (90 BASE) MCG/ACT inhaler  Cough - Plan: albuterol (PROVENTIL) (2.5 MG/3ML) 0.083% nebulizer solution 2.5 mg  Wheezing - Plan: albuterol (PROVENTIL) (2.5 MG/3ML) 0.083% nebulizer solution 2.5 mg  -exam c/w acute bronchitis, responded well to nebulizer tx here with improvement in symptoms and exam. She has run out of her albuterol - refilled and instructed on use. Since seemed to respond well to IM steroids several  months ago will do this today as well - depo-medrol 80 IM. On day 5/10 of Avelox and advised to complete this course. Referral to pulm given ongoing issues and her request, appt details provided for June 9th with [facility]. Of note, some of her hemoptysis or brown streaked mucus may be from her nasal passages per exam findings. Advised humidifier and no aggressive blowing or wiping of nose. Return precuations and follow up with PCP. -has coumadin clinic follow up scheduled -Patient advised to return or notify a doctor immediately if symptoms worsen or persist or new concerns arise.  There are no Patient Instructions on file for this visit.   Kriste Basque R.

## 2012-12-27 NOTE — Telephone Encounter (Signed)
Patient Information:  Caller Name: Celisa  Phone: [phone]  Patient: Erica, [name]  Gender: Female  DOB: 14-Apr-1929  Age: 79 Years  PCP: [name] (Adults only)  Office Follow Up:  Does the office need to follow up with this patient?: No  Instructions For The Office: N/A  RN Note:  Reports status update to MD.  On Avelox for chronic cough X 3 months. Coughing spells cause shortness of breath.  After coughed up blood this morning, felt she could not swallow due to "bad taste" in her mouth.  Within 1 hour was able to swallow medications "coated in butter."  Symptoms  Reason For Call & Symptoms: On Avelox, Mucinex DM and Claritin since office visit 12/21/12 for persistant cough for approximately 3 months with old blood in sputum.  Yellow phlem and nasal mucus. Intermittently coughs up blood; awoke with fresh blood in mouth this morning.  Reviewed Health History In EMR: Yes  Reviewed Medications In EMR: Yes  Reviewed Allergies In EMR: Yes  Reviewed Surgeries / Procedures: Yes  Date of Onset of Symptoms: 12/27/2012  Treatments Tried: Avelox, Mucinex DM, Claritin  Treatments Tried Worked: No  Guideline(s) Used:  Cough  Disposition Per Guideline:   Go to Office Now  Reason For Disposition Reached:   Coughed up > 1 tablespoon (15 ml) blood (Exception: blood-tinged sputum)  Advice Given:  Call Back If:  Difficulty breathing  You become worse.  Patient Will Follow Care Advice:  YES  Appointment Scheduled:  12/27/2012 10:45:00 Appointment Scheduled Provider:  Kriste Basque [facility])

## 2013-01-03 ENCOUNTER — Ambulatory Visit (INDEPENDENT_AMBULATORY_CARE_PROVIDER_SITE_OTHER): Payer: Medicare Other | Admitting: Family

## 2013-01-03 DIAGNOSIS — Z7901 Long term (current) use of anticoagulants: Secondary | ICD-10-CM

## 2013-01-03 DIAGNOSIS — I4891 Unspecified atrial fibrillation: Secondary | ICD-10-CM

## 2013-01-03 NOTE — Patient Instructions (Addendum)
Hold coumadin today and tomorrow. Eat more greens. Continue at 5mg  a day. Recheck in  2 weeks.  Anticoagulation Dose Instructions as of 01/03/2013     Glynis Smiles Tue Wed Thu Fri Sat   New Dose 5 mg 5 mg 5 mg 5 mg 5 mg 5 mg 5 mg    Description       Hold coumadin today and tomorrow. Eat more greens. Continue at 5mg  a day. Recheck in  2 weeks.

## 2013-01-07 ENCOUNTER — Ambulatory Visit (INDEPENDENT_AMBULATORY_CARE_PROVIDER_SITE_OTHER)
Admission: RE | Admit: 2013-01-07 | Discharge: 2013-01-07 | Disposition: A | Discharge status: Home / Self Care | Payer: Medicare Other | Source: Ambulatory Visit | Attending: Internal Medicine | Admitting: Internal Medicine

## 2013-01-07 ENCOUNTER — Encounter: Payer: Self-pay | Admitting: Internal Medicine

## 2013-01-07 ENCOUNTER — Ambulatory Visit (INDEPENDENT_AMBULATORY_CARE_PROVIDER_SITE_OTHER): Payer: Medicare Other | Admitting: Internal Medicine

## 2013-01-07 VITALS — BP 110/60 | HR 78 | Temp 98.1°F | Ht 60.5 in | Wt 123.0 lb

## 2013-01-07 DIAGNOSIS — T50901S Poisoning by unspecified drugs, medicaments and biological substances, accidental (unintentional), sequela: Secondary | ICD-10-CM

## 2013-01-07 DIAGNOSIS — J45909 Unspecified asthma, uncomplicated: Secondary | ICD-10-CM

## 2013-01-07 DIAGNOSIS — T6591XS Toxic effect of unspecified substance, accidental (unintentional), sequela: Secondary | ICD-10-CM

## 2013-01-07 MED ORDER — PREDNISONE (PAK) 10 MG PO TABS: ORAL_TABLET | ORAL | Status: DC

## 2013-01-07 NOTE — Progress Notes (Signed)
Subjective:     Patient ID: [name], female   DOB: Feb 14, 1929 MRN: [id number]  HPI  74 yowf never smoker with bad gerd > es ca > esophagectomy [name] referred 01/07/2013 to pulmonary  by Dr Kriste Basque for new cough x early 2014  01/07/2013 1st pulmonary eval in EMR era previously eval   remotely (pre EMR) by Delford Field with prednisone responsive cough  Now with recurrent cough x 5 years usually better with abx but never resoved then about 5 months ago more persistent, daily,  and not better with abx assoc with recurrent dysphagia s/p stent which relieved the dysphagia. Cough is day = night, prod of mod abmt so mucoid sputum and intermittent  Blood up to a tbsp at a time.while maintained on coumadin for afib.Marland Kitchen  No obvious daytime variabilty or assoc chronic  cp or chest tightness, subjective wheeze overt sinus or hb symptoms. No unusual exp hx or h/o childhood pna/ asthma or premature birth to her knowledge.      Also denies any obvious fluctuation of symptoms with weather or environmental changes or other aggravating or alleviating factors except as outlined above.  Current Medications, Allergies, Past Medical History, Past Surgical History, Family History, and Social History were reviewed in [employer] record.  ROS  The following are not active complaints unless bolded sore throat, dysphagia, dental problems, itching, sneezing,  nasal congestion or excess/ purulent secretions, ear ache,   fever, chills, sweats, unintended wt loss, pleuritic or exertional cp, hemoptysis,  orthopnea pnd or leg swelling, presyncope, palpitations, heartburn, abdominal pain, anorexia, nausea, vomiting, diarrhea  or change in bowel or urinary habits, change in stools or urine, dysuria,hematuria,  rash, arthralgias, visual complaints, headache, numbness weakness or ataxia or problems with walking or coordination,  change in mood/affect or memory.        Review of Systems     Objective:    Physical Exam     Wt Readings from Last 3 Encounters:  01/07/13 123 lb (55.792 kg)  12/27/12 119 lb (53.978 kg)  12/21/12 118 lb (53.524 kg)     HEENT: nl dentition, turbinates, and orophanx. Nl external ear canals without cough reflex   NECK :  without JVD/Nodes/TM/ nl carotid upstrokes bilaterally   LUNGS: no acc muscle use, insp and exp rhonchi bilaterally   CV:  RRR  no s3 or murmur or increase in P2, no edema   ABD:  soft and nontender with nl excursion in the supine position. No bruits or organomegaly, bowel sounds nl  MS:  warm without deformities, calf tenderness, cyanosis or clubbing  SKIN: warm and dry without lesions    NEURO:  alert, approp, no deficits    cxr 10/17/12 Large caliber esophageal stent remains in place. Chronic  cardiomegaly. Chronic basilar lung markings, slightly more  prominent than seen last year. This raises the possibility of mild  basilar pneumonia.          CXR  01/07/2013 :  Again noted left basilar atelectasis, infiltrate or scarring. No pulmonary edema

## 2013-01-07 NOTE — Assessment & Plan Note (Signed)
Given intermittent hemoptysis no choice but to hold coumadin for any active bleeding

## 2013-01-07 NOTE — Assessment & Plan Note (Signed)
DDX of  difficult airways managment all start with A and  include Adherence, Ace Inhibitors, Acid Reflux, Active Sinus Disease, Alpha 1 Antitripsin deficiency, Anxiety masquerading as Airways dz,  ABPA,  allergy(esp in young), Aspiration (esp in elderly), Adverse effects of DPI,  Active smokers, plus two Bs  = Bronchiectasis and Beta blocker use..and one C= CHF   Aspiration secondary to ongoing gerd s/p stent is the most likely explanation at this point.    No evidence of active infection to warrant abx > rx short course prednisone then regroup and consider maint neb with perforomist/bud

## 2013-01-07 NOTE — Patient Instructions (Addendum)
Prednisone 10 mg take  4 each am x 2 days,   2 each am x 2 days,  1 each am x 2 days and stop  Hold coumadin when coughing up blood  Please remember to go to the x-ray department downstairs for your tests - we will call you with the results when they are available.     Please schedule a follow up office visit in 1 weeks, sooner if nee

## 2013-01-09 NOTE — Progress Notes (Signed)
Quick Note:  Spoke with pt and notified of results per Dr. Wert. Pt verbalized understanding and denied any questions.  ______ 

## 2013-01-15 ENCOUNTER — Ambulatory Visit: Payer: Medicare Other

## 2013-01-15 ENCOUNTER — Encounter: Payer: Self-pay | Admitting: Internal Medicine

## 2013-01-15 ENCOUNTER — Ambulatory Visit (INDEPENDENT_AMBULATORY_CARE_PROVIDER_SITE_OTHER)
Admission: RE | Admit: 2013-01-15 | Discharge: 2013-01-15 | Disposition: A | Discharge status: Home / Self Care | Payer: Medicare Other | Source: Ambulatory Visit | Attending: Internal Medicine | Admitting: Internal Medicine

## 2013-01-15 ENCOUNTER — Ambulatory Visit (INDEPENDENT_AMBULATORY_CARE_PROVIDER_SITE_OTHER): Payer: Medicare Other | Admitting: Internal Medicine

## 2013-01-15 VITALS — BP 108/68 | HR 97 | Temp 97.5°F | Ht 60.5 in | Wt 119.0 lb

## 2013-01-15 DIAGNOSIS — R042 Hemoptysis: Secondary | ICD-10-CM

## 2013-01-15 DIAGNOSIS — I10 Essential (primary) hypertension: Secondary | ICD-10-CM

## 2013-01-15 DIAGNOSIS — J45909 Unspecified asthma, uncomplicated: Secondary | ICD-10-CM

## 2013-01-15 DIAGNOSIS — I4891 Unspecified atrial fibrillation: Secondary | ICD-10-CM

## 2013-01-15 LAB — CBC WITH DIFFERENTIAL/PLATELET
Basophils Relative: 0.2 % (ref 0.0–3.0)
Eosinophils Absolute: 0.3 10*3/uL (ref 0.0–0.7)
Hemoglobin: 13.7 g/dL (ref 12.0–15.0)
Lymphocytes Relative: 21.6 % (ref 12.0–46.0)
MCHC: 32.1 g/dL (ref 30.0–36.0)
MCV: 82.8 fl (ref 78.0–100.0)
Monocytes Absolute: 1 10*3/uL (ref 0.1–1.0)
Neutro Abs: 7.4 10*3/uL (ref 1.4–7.7)
RBC: 5.15 Mil/uL — ABNORMAL HIGH (ref 3.87–5.11)

## 2013-01-15 LAB — BASIC METABOLIC PANEL
BUN: 11 mg/dL (ref 6–23)
Chloride: 98 mEq/L (ref 96–112)
Creat: 0.75 mg/dL (ref 0.50–1.10)
Glucose, Bld: 108 mg/dL — ABNORMAL HIGH (ref 70–99)

## 2013-01-15 MED ORDER — MOMETASONE FURO-FORMOTEROL FUM 100-5 MCG/ACT IN AERO: INHALATION_SPRAY | RESPIRATORY_TRACT | Status: DC

## 2013-01-15 MED ORDER — IOHEXOL 350 MG/ML SOLN
80.0000 mL | Freq: Once | INTRAVENOUS | Status: AC | PRN
  Administered 2013-01-15: 80 mL via INTRAVENOUS

## 2013-01-15 NOTE — Patient Instructions (Addendum)
Please see patient coordinator before you leave today  to schedule sinus and chest CT  Stop coumadin   Start dulera 100  Take 2 puffs first thing in am and then another 2 puffs about 12 hours later.  Ok to still use albuterol but do so as needed up to every 4 hours if short of breath  Please remember to go to the lab  department downstairs for your tests - we will call you with the results when they are available.     Bronchoscopy has been scheduled for Friday 6/20 at [facility]. Come to outpatient registration at 715 am and nothing to eat or drink after midnight Thursday.  Someone will need to drive you home

## 2013-01-15 NOTE — Assessment & Plan Note (Signed)
D/c coumadin 01/15/2013 due to hemoptysis > risk of stroke reviewed.

## 2013-01-15 NOTE — Progress Notes (Signed)
Subjective:     Patient ID: [name], female   DOB: 05/09/1929 MRN: [id number]   Brief patient profile:  41 yowf never smoker with bad gerd > es ca > esophagectomy [name] referred 01/07/2013 to pulmonary  by Dr [name] for new cough x early 2014   HPI 01/07/2013 1st pulmonary eval in EMR era previously eval   remotely (pre EMR) by [name] with prednisone responsive cough  Now with recurrent cough x 5 years usually better with abx but never resoved then about 5 months ago more persistent, daily,  and not better with abx assoc with recurrent dysphagia s/p stent which relieved the dysphagia. Cough is day = night, prod of mod abmt so mucoid sputum and intermittent  Blood up to a tbsp at a time.while maintained on coumadin for afib.. rec Prednisone 10 mg take  4 each am x 2 days,   2 each am x 2 days,  1 each am x 2 days and stop Hold coumadin when coughing up blood   01/15/2013 f/u ov/[name] re cough/hemoptysis/ still on coumadin Chief Complaint  Patient presents with  . Follow-up    Pt states her cough is unchnaged since the last visit. Still waking up at least twice in the night with hemoptysis.   no epistaxis, less than 2 tbsp of blood per day. Mild dyspnea with acitivities of daily living.  No obvious daytime variabilty or cp or chest tightness, subjective wheeze overt sinus or hb symptoms. No unusual exp hx or h/o childhood pna/ asthma or premature birth to her knowledge.     Also denies any obvious fluctuation of symptoms with weather or environmental changes or other aggravating or alleviating factors except as outlined above.  Current Medications, Allergies, Past Medical History, Past Surgical History, Family History, and Social History were reviewed in [employer] record.  ROS  The following are not active complaints unless bolded sore throat, dysphagia, dental problems, itching, sneezing,  nasal congestion or excess/ purulent secretions, ear ache,    fever, chills, sweats, unintended wt loss, pleuritic or exertional cp, hemoptysis,  orthopnea pnd or leg swelling, presyncope, palpitations, heartburn, abdominal pain, anorexia, nausea, vomiting, diarrhea  or change in bowel or urinary habits, change in stools or urine, dysuria,hematuria,  rash, arthralgias, visual complaints, headache, numbness weakness or ataxia or problems with walking or coordination,  change in mood/affect or memory.              Objective:   Physical Exam   01/15/2013      119  Wt Readings from Last 3 Encounters:  01/07/13 123 lb (55.792 kg)  12/27/12 119 lb (53.978 kg)  12/21/12 118 lb (53.524 kg)     HEENT: nl dentition, turbinates, and orophanx. Nl external ear canals without cough reflex   NECK :  without JVD/Nodes/TM/ nl carotid upstrokes bilaterally   LUNGS: no acc muscle use, insp and exp rhonchi bilaterally   CV:  RRR  no s3 or murmur or increase in P2, no edema   ABD:  soft and nontender with nl excursion in the supine position. No bruits or organomegaly, bowel sounds nl  MS:  warm without deformities, calf tenderness, cyanosis or clubbing  SKIN: warm and dry without lesions         CT chest 01/15/2013  1. Negative for pulmonary embolism. 2. Patchy ground-glass attenuation airspace disease in the dependent distribution of the bilateral lower lobes with associated debris within segmental and subsegmental lower lobe bronchi.  Findings are most consistent chronic subclinical aspiration versus bilateral bronchopneumonia. Chronic aspiration is favored.

## 2013-01-15 NOTE — Assessment & Plan Note (Signed)
CT chest c/w chronic asp s a focal source of blood and we may well not be able to localize it further s invasive procedures.  Discussed in detail all the  indications, usual  risks and alternatives  relative to the benefits with patient who agrees to proceed with bronchoscopy to look for source and hold coumadin indefinitely

## 2013-01-15 NOTE — Addendum Note (Signed)
Addended by: Sandrea Hughs B on: 01/15/2013 08:41 PM   Modules accepted: Orders, Medications

## 2013-01-15 NOTE — Assessment & Plan Note (Addendum)
The proper method of use, as well as anticipated side effects, of a metered-dose inhaler are discussed and demonstrated to the patient. Improved effectiveness after extensive coaching during this visit to a level of approximately  75% so will try dulera 100 Take 2 puffs first thing in am and then another 2 puffs about 12 hours later.

## 2013-01-16 NOTE — Progress Notes (Signed)
Quick Note:  Spoke with pt and notified of results per Dr. Wert. Pt verbalized understanding and denied any questions.  ______ 

## 2013-01-17 ENCOUNTER — Ambulatory Visit (INDEPENDENT_AMBULATORY_CARE_PROVIDER_SITE_OTHER): Payer: Medicare Other | Admitting: Family

## 2013-01-17 DIAGNOSIS — Z7901 Long term (current) use of anticoagulants: Secondary | ICD-10-CM

## 2013-01-17 DIAGNOSIS — I4891 Unspecified atrial fibrillation: Secondary | ICD-10-CM

## 2013-01-17 LAB — POCT INR: INR: 1

## 2013-01-17 NOTE — Patient Instructions (Addendum)
Continue to hold Coumadin. Resume Coumadin under the direction of Dr. Sherene Sires. Let us know when that is.

## 2013-01-17 NOTE — Progress Notes (Signed)
Quick Note:  Spoke with pt and notified of results per Dr. Wert. Pt verbalized understanding and denied any questions.  ______ 

## 2013-01-18 ENCOUNTER — Telehealth: Payer: Self-pay | Admitting: Internal Medicine

## 2013-01-18 ENCOUNTER — Encounter (HOSPITAL_COMMUNITY)
Admission: RE | Disposition: A | Discharge status: Home / Self Care | Payer: Self-pay | Source: Ambulatory Visit | Attending: Internal Medicine

## 2013-01-18 ENCOUNTER — Ambulatory Visit (HOSPITAL_COMMUNITY)
Admission: RE | Admit: 2013-01-18 | Discharge: 2013-01-18 | Disposition: A | Discharge status: Home / Self Care | Payer: Medicare Other | Source: Ambulatory Visit | Attending: Internal Medicine | Admitting: Internal Medicine

## 2013-01-18 ENCOUNTER — Encounter (HOSPITAL_COMMUNITY): Payer: Self-pay | Admitting: Respiratory Therapy

## 2013-01-18 DIAGNOSIS — R0609 Other forms of dyspnea: Secondary | ICD-10-CM | POA: Insufficient documentation

## 2013-01-18 DIAGNOSIS — R042 Hemoptysis: Secondary | ICD-10-CM

## 2013-01-18 DIAGNOSIS — Z8501 Personal history of malignant neoplasm of esophagus: Secondary | ICD-10-CM | POA: Insufficient documentation

## 2013-01-18 DIAGNOSIS — IMO0002 Reserved for concepts with insufficient information to code with codable children: Secondary | ICD-10-CM | POA: Insufficient documentation

## 2013-01-18 DIAGNOSIS — R0989 Other specified symptoms and signs involving the circulatory and respiratory systems: Secondary | ICD-10-CM | POA: Insufficient documentation

## 2013-01-18 DIAGNOSIS — Z7901 Long term (current) use of anticoagulants: Secondary | ICD-10-CM | POA: Insufficient documentation

## 2013-01-18 DIAGNOSIS — K219 Gastro-esophageal reflux disease without esophagitis: Secondary | ICD-10-CM | POA: Insufficient documentation

## 2013-01-18 DIAGNOSIS — I4891 Unspecified atrial fibrillation: Secondary | ICD-10-CM | POA: Insufficient documentation

## 2013-01-18 HISTORY — PX: VIDEO BRONCHOSCOPY: SHX5072

## 2013-01-18 LAB — BODY FLUID CELL COUNT WITH DIFFERENTIAL

## 2013-01-18 SURGERY — VIDEO BRONCHOSCOPY WITHOUT FLUORO
Anesthesia: Moderate Sedation | Laterality: Bilateral

## 2013-01-18 MED ORDER — PHENYLEPHRINE HCL 0.25 % NA SOLN
NASAL | Status: DC | PRN
  Administered 2013-01-18: 1 via NASAL

## 2013-01-18 MED ORDER — LIDOCAINE HCL 2 % EX GEL
CUTANEOUS | Status: DC | PRN
  Administered 2013-01-18: 1

## 2013-01-18 MED ORDER — LIDOCAINE HCL 1 % IJ SOLN
INTRAMUSCULAR | Status: DC | PRN
  Administered 2013-01-18: 6 mL via RESPIRATORY_TRACT

## 2013-01-18 MED ORDER — MEPERIDINE HCL 100 MG/ML IJ SOLN
INTRAMUSCULAR | Status: AC
  Filled 2013-01-18: qty 2

## 2013-01-18 MED ORDER — MIDAZOLAM HCL 10 MG/2ML IJ SOLN
INTRAMUSCULAR | Status: DC | PRN
  Administered 2013-01-18 (×2): 2.5 mg via INTRAVENOUS

## 2013-01-18 MED ORDER — MEPERIDINE HCL 25 MG/ML IJ SOLN
INTRAMUSCULAR | Status: DC | PRN
  Administered 2013-01-18: 25 mg via INTRAVENOUS

## 2013-01-18 MED ORDER — MIDAZOLAM HCL 10 MG/2ML IJ SOLN
INTRAMUSCULAR | Status: AC
  Filled 2013-01-18: qty 4

## 2013-01-18 MED ORDER — SODIUM CHLORIDE 0.9 % IV SOLN
INTRAVENOUS | Status: DC
  Administered 2013-01-18: 08:00:00 via INTRAVENOUS

## 2013-01-18 NOTE — H&P (Signed)
Brief patient profile:   10 yowf never smoker with bad gerd > es ca > esophagectomy [name] referred 01/07/2013 to pulmonary  by Dr Kriste Basque for new cough x early 2014     HPI 01/07/2013 1st pulmonary eval in EMR era previously eval   remotely (pre EMR) by Delford Field with prednisone responsive cough  Now with recurrent cough x 5 years usually better with abx but never resoved then about 5 months ago more persistent, daily,  and not better with abx assoc with recurrent dysphagia s/p stent which relieved the dysphagia. Cough is day = night, prod of mod abmt so mucoid sputum and intermittent  Blood up to a tbsp at a time.while maintained on coumadin for afib.. rec Prednisone 10 mg take  4 each am x 2 days,   2 each am x 2 days,  1 each am x 2 days and stop Hold coumadin when coughing up blood     01/15/2013 f/u ov/Natalynn Pedone re cough/hemoptysis/ still on coumadin Chief Complaint   Patient presents with   .  Follow-up       Pt states her cough is unchnaged since the last visit. Still waking up at least twice in the night with hemoptysis.     no epistaxis, less than 2 tbsp of blood per day. Mild dyspnea with acitivities of daily living.   No obvious daytime variabilty or cp or chest tightness, subjective wheeze overt sinus or hb symptoms. No unusual exp hx or h/o childhood pna/ asthma or premature birth to her knowledge.      Also denies any obvious fluctuation of symptoms with weather or environmental changes or other aggravating or alleviating factors except as outlined above.   Current Medications, Allergies, Past Medical History, Past Surgical History, Family History, and Social History were reviewed in [employer] record.   ROS  The following are not active complaints unless bolded sore throat, dysphagia, dental problems, itching, sneezing,  nasal congestion or excess/ purulent secretions, ear ache,   fever, chills, sweats, unintended wt loss, pleuritic or exertional  cp, hemoptysis,  orthopnea pnd or leg swelling, presyncope, palpitations, heartburn, abdominal pain, anorexia, nausea, vomiting, diarrhea  or change in bowel or urinary habits, change in stools or urine, dysuria,hematuria,  rash, arthralgias, visual complaints, headache, numbness weakness or ataxia or problems with walking or coordination,  change in mood/affect or memory.                   Objective:     Physical Exam    01/15/2013      119   Wt Readings from Last 3 Encounters:   01/07/13  123 lb (55.792 kg)   12/27/12  119 lb (53.978 kg)   12/21/12  118 lb (53.524 kg)        HEENT: nl dentition, turbinates, and orophanx. Nl external ear canals without cough reflex     NECK :  without JVD/Nodes/TM/ nl carotid upstrokes bilaterally     LUNGS: no acc muscle use, insp and exp rhonchi bilaterally     CV:  RRR  no s3 or murmur or increase in P2, no edema    ABD:  soft and nontender with nl excursion in the supine position. No bruits or organomegaly, bowel sounds nl   MS:  warm without deformities, calf tenderness, cyanosis or clubbing   SKIN: warm and dry without lesions            CT chest 01/15/2013  1. Negative for pulmonary embolism. 2. Patchy ground-glass attenuation airspace disease in the dependent distribution of the bilateral lower lobes with associated debris within segmental and subsegmental lower lobe bronchi. Findings are most consistent chronic subclinical aspiration versus bilateral bronchopneumonia. Chronic aspiration is favored.                       Hemoptysis -           CT chest c/w chronic asp s a focal source of blood and we may well not be able to localize it further s invasive procedures.   Discussed in detail all the  indications, usual  risks and alternatives  relative to the benefits with patient who agrees to proceed with bronchoscopy to look for source and hold coumadin indefinitely         Asthma with  bronchitis -           The proper method of use, as well as anticipated side effects, of a metered-dose inhaler are discussed and demonstrated to the patient. Improved effectiveness after extensive coaching during this visit to a level of approximately  75% so will try dulera 100 Take 2 puffs first thing in am and then another 2 puffs about 12 hours later.           Atrial fibrillation with rapid ventricular response        D/c coumadin 01/15/2013 due to hemoptysis > risk of stroke reviewed.                 Stop coumadin    Start dulera 100  Take 2 puffs first thing in am and then another 2 puffs about 12 hours later.   Ok to still use albuterol but do so as needed up to every 4 hours if short of breath       Bronchoscopy has been scheduled for Friday 6/20 at [facility]. Come to outpatient registration at 715 am and nothing to eat or drink after midnight Thursday.  Someone will need to drive you home        addendum 01/18/2013 day of procedure, no change in hx or exam     Sandrea Hughs, MD Pulmonary and Critical Care Medicine [facility] Cell [phone] After 5:30 PM or weekends, call [phone]

## 2013-01-18 NOTE — Op Note (Signed)
Bronchoscopy Procedure Note  Date of Operation: 01/18/2013   Pre-op Diagnosis: Hemoptysis ? etiology  Post-op Diagnosis: Same  Surgeon: Sandrea Hughs  Anesthesia: Monitored Local Anesthesia with Sedation  Operation: Video Flexible fiberoptic bronchoscopy, diagnostic   Findings: minimal secretions, none bloody  Specimen: Lingular lavage  Estimated Blood Loss: none   Complications: none  Indications and History: See updated H and P same date. The risks, benefits, complications, treatment options and expected outcomes were discussed with the patient.  The possibilities of reaction to medication, pulmonary aspiration, perforation of a viscus, bleeding, failure to diagnose a condition and creating a complication requiring transfusion or operation were discussed with the patient who freely signed the consent.    Description of Procedure: The patient was re-examined in the bronchoscopy suite .  The patient was identified  and the procedure verified as Flexible Fiberoptic Bronchoscopy.  A Time Out was held and the above information confirmed.   After the induction of topical nasopharyngeal anesthesia, the patient was positioned  and the bronchoscope was passed through the R naris. The vocal cords were visualized and  1% buffered lidocaine 5 ml was topically placed onto the cords. The cords were normal. The scope was then passed into the trachea.  1% buffered lidocaine given topically. Airways inspected bilaterally to the subsegmental level with the following findings:  All airways opened widely to sub-segmental level bilaterally. Min mucoid secretions worst in LUL so Lavage performed of the lingula with adequate return.  During procedure pt coughed up bilious material from her throat not seen in airways c/w GI reflux.   The Patient was taken to the Endoscopy Recovery area in satisfactory condition.  Attestation: I performed the procedure.   Sandrea Hughs, MD Pulmonary and Critical Care  Medicine  Healthcare Cell [phone] After 5:30 PM or weekends, call [phone]

## 2013-01-18 NOTE — Telephone Encounter (Signed)
Pt Niece aware of recs per MW Niece to call back if new Rx needed for more Tramadol

## 2013-01-18 NOTE — Telephone Encounter (Addendum)
--  CLOSED IN ERROR--  Pt's niece states that pt had bronch this am. Pt will fall asleep, wake up coughing w/ small amounts of blood. Would like to know if there is a cough syrup or something else that MW might prescribe. Pharmacy is [facility] in [place].   Spoke with Niece for pt--states that since bronch, pt has not been able to sleep d/t cough. Pt reports increased amounts of tan mucus and some blood (bright at time).  I explained to Niece that this is normal since the procedure d/t irritation within the lungs Pt states that she is uncomfortable and "hurting/aching" On a scale of 1-10 pt is an 8 Requesting something for cough suppression--syrup? Pt does take Tramadol 50mg  (1 tab q8hr) for pain PRN.  Allergies  Allergen Reactions  . Erythromycin Ethylsuccinate     Irregular pulse rate  . Prednisone     "makes me hyper"   [facility] [place]  Please advise for pt Dr Sherene Sires. Thanks.

## 2013-01-18 NOTE — Telephone Encounter (Signed)
Ok to take tramadol 50 mg up to 2 every 4 hours for pain or cough to supplement  mucinex dm 1200 mg every 12 hours prn

## 2013-01-18 NOTE — Telephone Encounter (Signed)
Pt had bronchoscopy done today at [facility] by Dr Sherene Sires and airway was clear. The blood is possibly coming from stomach. Dr Sherene Sires will contact Dr Arlyce Dice. Pt is off coumadin and will see Dr Sherene Sires in 2wks. Pt is still coughing should she get a refill on cough syrup. Pt has refills remaindering

## 2013-01-18 NOTE — Progress Notes (Signed)
Video Bronchoscopy Done  Intervention Bronchial Washing  Procedure tolerated well 

## 2013-01-18 NOTE — Telephone Encounter (Signed)
Talked with niece and told her to call dr wert since he is pulmonary and just did procedure

## 2013-01-20 LAB — CULTURE, RESPIRATORY W GRAM STAIN

## 2013-01-22 ENCOUNTER — Encounter (HOSPITAL_COMMUNITY): Payer: Self-pay | Admitting: Internal Medicine

## 2013-01-25 ENCOUNTER — Telehealth: Payer: Self-pay | Admitting: Gastroenterology

## 2013-01-25 ENCOUNTER — Telehealth: Payer: Self-pay | Admitting: Internal Medicine

## 2013-01-25 ENCOUNTER — Other Ambulatory Visit (INDEPENDENT_AMBULATORY_CARE_PROVIDER_SITE_OTHER): Payer: Medicare Other

## 2013-01-25 ENCOUNTER — Encounter: Payer: Self-pay | Admitting: Adult Health

## 2013-01-25 ENCOUNTER — Ambulatory Visit (INDEPENDENT_AMBULATORY_CARE_PROVIDER_SITE_OTHER)
Admission: RE | Admit: 2013-01-25 | Discharge: 2013-01-25 | Disposition: A | Discharge status: Home / Self Care | Payer: Medicare Other | Source: Ambulatory Visit | Attending: Adult Health | Admitting: Adult Health

## 2013-01-25 ENCOUNTER — Ambulatory Visit (INDEPENDENT_AMBULATORY_CARE_PROVIDER_SITE_OTHER): Payer: Medicare Other | Admitting: Adult Health

## 2013-01-25 VITALS — BP 104/64 | HR 72 | Temp 98.1°F | Ht 61.0 in | Wt 119.4 lb

## 2013-01-25 DIAGNOSIS — R042 Hemoptysis: Secondary | ICD-10-CM

## 2013-01-25 LAB — CBC WITH DIFFERENTIAL/PLATELET
Basophils Relative: 0.3 % (ref 0.0–3.0)
Eosinophils Relative: 1.3 % (ref 0.0–5.0)
Lymphocytes Relative: 17.8 % (ref 12.0–46.0)
Monocytes Relative: 8.1 % (ref 3.0–12.0)
Neutrophils Relative %: 72.5 % (ref 43.0–77.0)
RBC: 5.1 Mil/uL (ref 3.87–5.11)
WBC: 12.9 10*3/uL — ABNORMAL HIGH (ref 4.5–10.5)

## 2013-01-25 LAB — PROTIME-INR: INR: 1.2 ratio — ABNORMAL HIGH (ref 0.8–1.0)

## 2013-01-25 MED ORDER — AMOXICILLIN-POT CLAVULANATE 875-125 MG PO TABS: 1.0000 | ORAL_TABLET | Freq: Two times a day (BID) | ORAL | Status: AC

## 2013-01-25 NOTE — Telephone Encounter (Signed)
Spoke with patient and instructed her to call Dr. Thurston Hole office since she had a procedure on 01/18/13 and is now coughing up blood. Per OV notes, she has done this since the procedure.

## 2013-01-25 NOTE — Telephone Encounter (Signed)
Spoke with pt  She is c/o increased hemoptysis over the past couple of days Also reports feeling more SOB  No fever, but has "cold sweats" No CP/chest tightness OV with TP at 3 pm today, ED sooner if symptoms persist/worsen

## 2013-01-25 NOTE — Telephone Encounter (Signed)
Agree with triage

## 2013-01-25 NOTE — Patient Instructions (Addendum)
Begin Augmentin 875mg  Twice daily  For 7 days  Mucinex DM Twice daily  As needed  Cough/congestion  Fluids and rest  If coughing of blood does not resolve or worsens will need ov or go to ER.  Remain off coumadin.  Avoid aspirin products.  Follow up Dr. Sherene Sires  In 3 days and As needed

## 2013-01-25 NOTE — Progress Notes (Signed)
Subjective:     Patient ID: [name], female   DOB: 1928-12-31 MRN: [id number]   Brief patient profile:  50 yowf never smoker with bad gerd > es ca > esophagectomy [name] referred 01/07/2013 to pulmonary  by Dr Kriste Basque for new cough x early 2014   HPI 01/07/2013 1st pulmonary eval in EMR era previously eval   remotely (pre EMR) by Delford Field with prednisone responsive cough  Now with recurrent cough x 5 years usually better with abx but never resoved then about 5 months ago more persistent, daily,  and not better with abx assoc with recurrent dysphagia s/p stent which relieved the dysphagia. Cough is day = night, prod of mod abmt so mucoid sputum and intermittent  Blood up to a tbsp at a time.while maintained on coumadin for afib.. rec Prednisone 10 mg take  4 each am x 2 days,   2 each am x 2 days,  1 each am x 2 days and stop Hold coumadin when coughing up blood   01/15/2013 f/u ov/Wert re cough/hemoptysis/ still on coumadin Chief Complaint  Patient presents with  . Follow-up    Pt states her cough is unchnaged since the last visit. Still waking up at least twice in the night with hemoptysis.   no epistaxis, less than 2 tbsp of blood per day. Mild dyspnea with acitivities of daily living. >FOB   01/25/2013 Acute OV  Complains of increased hemoptysis with BRB x2 days with increased SOB.  Noticed several episodes last 2 days with blood tinged mucus-bright red. Tsp at times. Mostly clear mucus w/ no prulent mucus .  reports hemoptysis begins as "straight blood" then turns to clear mucus . No bleeding today.  Pt complains over last 3 months has had blood tinged mucus with cough. Was referred to pulmonary 6/9. CT chest angio neg for PE  , Patchy ground-glass attenuation airspace disease in the  dependent distribution of the bilateral lower lobes with associated  debris within segmental and subsegmental lower lobe bronchi.  Findings are most consistent chronic subclinical aspiration  versus  bilateral bronchopneumonia. Chronic aspiration is favored.  She has hx of esophageal cancer w/ prev. esophagectomy with gastric pull- through   She was on chronic coumadin for atrial fib . This was held due to persistent hemoptysis. She underwent FOB on 6/20 w/ cytology neg for malignant cells. No source of bleeding found . Did have gerd event during procedure.  She has no  Fever, discolored mucus or edema. No syncope or palpitations no chest pain.  Today labs show nml hbg/platelets and INR at 1.1.  Mild elevated wbc at 12.9k , w/ mild left shift  Appetite is at her baseline w/ no vomiting.  No diarrhea. No urinary symptoms.  CXR today w/ no acute changes.     Current Medications, Allergies, Past Medical History, Past Surgical History, Family History, and Social History were reviewed in [employer] record.  ROS  The following are not active complaints unless bolded sore throat, dysphagia, dental problems, itching, sneezing,  nasal congestion or excess/ purulent secretions, ear ache,   fever, chills, sweats, unintended wt loss, pleuritic or exertional cp, hemoptysis,  orthopnea pnd or leg swelling, presyncope, palpitations, heartburn, abdominal pain, anorexia, nausea, vomiting, diarrhea  or change in bowel or urinary habits, change in stools or urine, dysuria,hematuria,  rash, arthralgias, visual complaints, headache, numbness weakness or ataxia or problems with walking or coordination,  change in mood/affect or memory.  Objective:   Physical Exam   01/15/2013      119  >>119 01/25/2013   HEENT: nl dentition, turbinates, and orophanx. Nl external ear canals without cough reflex   NECK :  without JVD/Nodes/TM/ nl carotid upstrokes bilaterally   LUNGS: no acc muscle use, insp and exp rhonchi bilaterally   CV:  RRR  no s3 or murmur or increase in P2, no edema   ABD:  soft and nontender with nl excursion in the supine position. No bruits  or organomegaly, bowel sounds nl  MS:  warm without deformities, calf tenderness, cyanosis or clubbing  SKIN: warm and dry without lesions         CT chest 01/15/2013  1. Negative for pulmonary embolism. 2. Patchy ground-glass attenuation airspace disease in the dependent distribution of the bilateral lower lobes with associated debris within segmental and subsegmental lower lobe bronchi. Findings are most consistent chronic subclinical aspiration versus bilateral bronchopneumonia. Chronic aspiration is favored.     CBC    Component Value Date/Time   WBC 12.9* 01/25/2013 1616   RBC 5.10 01/25/2013 1616   HGB 13.7 01/25/2013 1616   HCT 42.3 01/25/2013 1616   PLT 296.0 01/25/2013 1616   MCV 82.9 01/25/2013 1616   MCH 27.7 11/29/2011 0455   MCHC 32.3 01/25/2013 1616   RDW 15.3* 01/25/2013 1616   LYMPHSABS 2.3 01/25/2013 1616   MONOABS 1.0 01/25/2013 1616   EOSABS 0.2 01/25/2013 1616   BASOSABS 0.0 01/25/2013 1616    Recent Labs Lab 01/25/13 1616  INR 1.2*   CXR 01/25/2013 >No new/acute cardiopulmonary disease. Stable chronic  scar/atelectasis in the medial lower lobes. Large intrathoracic  stomach/gastric pull-through with an indwelling stent shown on  prior CT to extend into the proximal duodenum.

## 2013-01-25 NOTE — Assessment & Plan Note (Addendum)
Persistent hemoptysis x 3 months ? Etiology  Now off couamdin ~2 weeks w/ no significant change  Extensive workup w/ non revealing FOB  CT chest neg for PE . Suspected aspiration w/ previous esophagectomy and also reflux event during bronchoscopy.  Wbc slightly elevated today  hbg and plt are nml , INR is ok. Off coumadin  Pt is too high risk to remain on anticoagulation at this time  She is aware of risk of being off coumadin.  Family-sister coming to stay with her today at home.   Plan  Begin Augmentin 875mg  Twice daily  For 7 days  Mucinex DM Twice daily  As needed  Cough/congestion  Fluids and rest  If coughing of blood does not resolve or worsens will need ov or go to ER.  Remain off coumadin.  Avoid aspirin products.  Follow up Dr. Sherene Sires  In 3 days and As needed    CXR today w/ no acute process  Feel this is related to recurrent aspiration events  Will treat with antibiotics w/ short term follow up in 3 days  She is aware if not improving or worsens to see ER attention  Case discussed in detail with Dr. Craige Cotta    Plan

## 2013-01-28 ENCOUNTER — Ambulatory Visit (INDEPENDENT_AMBULATORY_CARE_PROVIDER_SITE_OTHER): Payer: Medicare Other | Admitting: Internal Medicine

## 2013-01-28 ENCOUNTER — Encounter: Payer: Self-pay | Admitting: Internal Medicine

## 2013-01-28 VITALS — BP 104/70 | HR 68 | Temp 97.5°F | Ht 61.0 in | Wt 119.8 lb

## 2013-01-28 DIAGNOSIS — R042 Hemoptysis: Secondary | ICD-10-CM

## 2013-01-28 DIAGNOSIS — J441 Chronic obstructive pulmonary disease with (acute) exacerbation: Secondary | ICD-10-CM

## 2013-01-28 DIAGNOSIS — J45909 Unspecified asthma, uncomplicated: Secondary | ICD-10-CM

## 2013-01-28 MED ORDER — MOMETASONE FURO-FORMOTEROL FUM 100-5 MCG/ACT IN AERO: INHALATION_SPRAY | RESPIRATORY_TRACT | Status: DC

## 2013-01-28 MED ORDER — ALBUTEROL SULFATE HFA 108 (90 BASE) MCG/ACT IN AERS: 2.0000 | INHALATION_SPRAY | Freq: Four times a day (QID) | RESPIRATORY_TRACT | Status: DC | PRN

## 2013-01-28 NOTE — Patient Instructions (Addendum)
Ok to continue to use ventolin up to 2 puffs every 4 hours  As needed for breathing or coughing  Ok to use tramadol up to every 4 hours for cough.   dulera 100 2 puffs every 12 hours  Finish your augmentin  GERD (REFLUX)  is an extremely common cause of respiratory symptoms, many times with no significant heartburn at all.    It can be treated with medication, but also with lifestyle changes including avoidance of late meals, excessive alcohol, smoking cessation, and avoid fatty foods, chocolate, peppermint, colas, red wine, and acidic juices such as orange juice.  NO MINT OR MENTHOL PRODUCTS SO NO COUGH DROPS  USE SUGARLESS CANDY INSTEAD (jolley ranchers or Stover's)  NO OIL BASED VITAMINS - use powdered substitutes.  Please schedule a follow up office visit in 2 weeks, sooner if needed to see Tammy NP with all inhalers in hand Add change to perforomist and budesonide if not mastering the hfa

## 2013-01-28 NOTE — Progress Notes (Signed)
Subjective:     Patient ID: [name], female   DOB: 13-Jul-1929 MRN: [id number]   Brief patient profile:  61 yowf never smoker with bad gerd >  hx of esophageal cancer w/ prev. esophagectomy with gastric pull- through by [name] referred 01/07/2013 to pulmonary  by [name] for new cough x early 2014   HPI 01/07/2013 1st pulmonary eval in EMR era previously eval   remotely (pre EMR) by [name] with prednisone responsive cough  Now with recurrent cough x 5 years usually better with abx but never resoved then about 5 months ago more persistent, daily,  and not better with abx assoc with recurrent dysphagia s/p stent which relieved the dysphagia. Cough is day = night, prod of mod abmt so mucoid sputum and intermittent  Blood up to a tbsp at a time.while maintained on coumadin for afib.. rec Prednisone 10 mg take  4 each am x 2 days,   2 each am x 2 days,  1 each am x 2 days and stop Hold coumadin when coughing up blood   01/15/2013 f/u ov/[name] re cough/hemoptysis/ still on coumadin Chief Complaint  Patient presents with  . Follow-up    Pt states her cough is unchnaged since the last visit. Still waking up at least twice in the night with hemoptysis.   no epistaxis, less than 2 tbsp of blood per day. Mild dyspnea with acitivities of daily living. >FOB 01/18/13 no bleeding identified, completely clear airways butcoughed up bilious mucus during procedure ? From stomach  01/25/2013 Acute OV / NP Complains of increased hemoptysis with BRB x2 days with increased SOB.  Noticed several episodes last 2 days with blood tinged mucus-bright red. Tsp at times. Mostly clear mucus w/ no prulent mucus .  reports hemoptysis begins as "straight blood" then turns to clear mucus . No bleeding today.  Pt complains over last 3 months has had blood tinged mucus with cough. Was referred to pulmonary 6/9. CT chest angio neg for PE  , Patchy ground-glass attenuation airspace disease in the  dependent distribution  of the bilateral lower lobes with associated  debris within segmental and subsegmental lower lobe bronchi.  Findings are most consistent chronic subclinical aspiration versus  bilateral bronchopneumonia. Chronic aspiration is favored. She was on chronic coumadin for atrial fib . This was held due to persistent hemoptysis. She underwent FOB on 6/20 w/ cytology neg for malignant cells. No source of bleeding found . Did have gerd event during procedure.  She has no  Fever, discolored mucus or edema. No syncope or palpitations no chest pain.  Today labs show nml hbg/platelets and INR at 1.1.  Mild elevated wbc at 12.9k , w/ mild left shift  Appetite is at her baseline w/ no vomiting.  No diarrhea. No urinary symptoms.  CXR w/ no acute changes rec Begin Augmentin 875mg  Twice daily  For 7 days  Mucinex DM Twice daily  As needed  Cough/congestion  Fluids and rest  If coughing of blood does not resolve or worsens will need ov or go to ER.  Remain off coumadin.  Avoid aspirin products hgb 13.2 > 13.7    01/28/2013 f/u ov/[name]  Chief Complaint  Patient presents with  . 3 day follow up    Hemoptysis has improved.  Did cough up a small amount of dry looking blood this morning.  Has SOb when walking [place] -- gradually worsening over the past 5 months.   no dysphagia, sleeps sitting up.  Breathing better p albuterol despite poor hfa/see a/p  No obvious daytime variabilty or assoc   cp or chest tightness, subjective wheeze overt sinus or hb symptoms. No unusual exp hx or h/o childhood pna/ asthma or knowledge of premature birth.     Also denies any obvious fluctuation of symptoms with weather or environmental changes or other aggravating or alleviating factors except as outlined above     Current Medications, Allergies, Past Medical History, Past Surgical History, Family History, and Social History were reviewed in [name] record.  ROS  The following are not active  complaints unless bolded sore throat, dysphagia, dental problems, itching, sneezing,  nasal congestion or excess/ purulent secretions, ear ache,   fever, chills, sweats, unintended wt loss, pleuritic or exertional cp, hemoptysis,  orthopnea pnd or leg swelling, presyncope, palpitations, heartburn, abdominal pain, anorexia, nausea, vomiting, diarrhea  or change in bowel or urinary habits, change in stools or urine, dysuria,hematuria,  rash, arthralgias, visual complaints, headache, numbness weakness or ataxia or problems with walking or coordination,  change in mood/affect or memory.              Objective:   Physical Exam   01/15/2013      119  >>119 01/25/2013 > 01/28/2013 120   HEENT: nl dentition, turbinates, and orophanx. Nl external ear canals without cough reflex   NECK :  without JVD/Nodes/TM/ nl carotid upstrokes bilaterally   LUNGS: no acc muscle use, min insp and exp rhonchi bilaterally   CV:  RRR  no s3 or murmur or increase in P2, no edema   ABD:  soft and nontender with nl excursion in the supine position. No bruits or organomegaly, bowel sounds nl  MS:  warm without deformities, calf tenderness, cyanosis or clubbing  SKIN: warm and dry without lesions         CT chest 01/15/2013  1. Negative for pulmonary embolism. 2. Patchy ground-glass attenuation airspace disease in the dependent distribution of the bilateral lower lobes with associated debris within segmental and subsegmental lower lobe bronchi. Findings are most consistent chronic subclinical aspiration versus bilateral bronchopneumonia. Chronic aspiration is favored.    CXR 01/25/2013 >No new/acute cardiopulmonary disease. Stable chronic  scar/atelectasis in the medial lower lobes. Large intrathoracic  stomach/gastric pull-through with an indwelling stent shown on  prior CT to extend into the proximal duodenum.

## 2013-01-30 NOTE — Assessment & Plan Note (Signed)
DDX of  difficult airways managment all start with A and  include Adherence, Ace Inhibitors, Acid Reflux, Active Sinus Disease, Alpha 1 Antitripsin deficiency, Anxiety masquerading as Airways dz,  ABPA,  allergy(esp in young), Aspiration (esp in elderly), Adverse effects of DPI,  Active smokers, plus two Bs  = Bronchiectasis and Beta blocker use..and one C= CHF  Adherence is always the initial "prime suspect" and is a multilayered concern that requires a "trust but verify" approach in every patient - starting with knowing how to use medications, especially inhalers, correctly, keeping up with refills and understanding the fundamental difference between maintenance and prns vs those medications only taken for a very short course and then stopped and not refilled. The proper method of use, as well as anticipated side effects, of a metered-dose inhaler are discussed and demonstrated to the patient. Improved effectiveness after extensive coaching during this visit to a level of approximately  75% so try dulera 100 2bid as responding somewhat to saba and just use the saba prn > may need to change to laba/ics neb   Active aspiration strongly suspected here since has stent and no LES. Already on max med rx not sure anything else to offer but will make Dr Arlyce Dice aware.

## 2013-01-30 NOTE — Assessment & Plan Note (Signed)
-   fob 01/18/13 neg for source of blood, bilious material "expectorated" during procedure though doubt came from lung   Recent Labs Lab 01/25/13 1616  HGB 13.7    hgb trending up from 13.2 previously so doubt hemoptysis is significant issue at this point but can't safely anticoagulate her> reviewed don't even want her to use asa or nsaids.

## 2013-02-02 ENCOUNTER — Telehealth: Payer: Self-pay | Admitting: Internal Medicine

## 2013-02-02 NOTE — Telephone Encounter (Signed)
Contacted by great-niece of Mrs. Cadden regarding ongoing hemoptysis ("coughing up blood") The patient has been seen recently pulmonary in this regard.  It appears she has also had bronchoscopy.  He was recently as of 01/25/2013 started on Augmentin twice a day x7 days They report she had an episode of hemoptysis this morning which took her several hours to clear.  The patient has now cleared the bleeding and is having no further hemoptysis Family recommended an ER visit but the patient refused Her great niece is now calling for advice.  The patient has followup with Dr. Arlyce Dice in late July but they would like this to occur sooner. I have advised that should she have recurrent hemoptysis, dyspnea, chest pain that she be seen in the ER this weekend. She voiced understanding  [facility] -- please work patient into the office early in the week (Mon/Tues) with either Dr. Arlyce Dice or advanced practitioner.

## 2013-02-04 ENCOUNTER — Telehealth: Payer: Self-pay | Admitting: Internal Medicine

## 2013-02-04 ENCOUNTER — Encounter: Payer: Self-pay | Admitting: Gastroenterology

## 2013-02-04 ENCOUNTER — Ambulatory Visit (INDEPENDENT_AMBULATORY_CARE_PROVIDER_SITE_OTHER): Payer: Medicare Other | Admitting: Gastroenterology

## 2013-02-04 VITALS — BP 96/62 | HR 88 | Ht 60.5 in | Wt 117.0 lb

## 2013-02-04 DIAGNOSIS — R05 Cough: Secondary | ICD-10-CM

## 2013-02-04 MED ORDER — CHLORPHEN-PE-ACETAMINOPHEN 2-5-325 MG PO TABS: 1.0000 | ORAL_TABLET | Freq: Every day | ORAL | Status: DC

## 2013-02-04 NOTE — Telephone Encounter (Signed)
Pt informed to take coricidin d per dr Lovell Sheehan

## 2013-02-04 NOTE — Assessment & Plan Note (Signed)
Pt has chronic cough with possible hematemesis versus hemoptysis and secondary aspiration. Etiologies may include an incompetent LES, gastroparesis, or partial gastric outlet obstruction.  If she indeed has hematemesis a source for upper GI bleeding needs to be identified.  Recommendations #1 upper endoscopy #2 gastric emptying scan if endoscopy does not demonstrate a gastric outlet obstruction #3 gastroparesis diet

## 2013-02-04 NOTE — Patient Instructions (Addendum)
Gastroparesis  Gastroparesis is also called slowed stomach emptying (delayed gastric emptying). It is a condition in which the stomach takes too long to empty its contents. It often happens in people with diabetes.  CAUSES  Gastroparesis happens when nerves to the stomach are damaged or stop working. When the nerves are damaged, the muscles of the stomach and intestines do not work normally. The movement of food is slowed or stopped. High blood glucose (sugar) causes changes in nerves and can damage the blood vessels that carry oxygen and nutrients to the nerves. RISK FACTORS  Diabetes.  Post-viral syndromes.  Eating disorders (anorexia, bulimia).  Surgery on the stomach or vagus nerve.  Gastroesophageal reflux disease (rarely).  Smooth muscle disorders (amyloidosis, scleroderma).  Metabolic disorders, including hypothyroidism.  Parkinson's disease. SYMPTOMS   Heartburn.  Feeling sick to your stomach (nausea).  Vomiting of undigested food.  An early feeling of fullness when eating.  Weight loss.  Abdominal bloating.  Erratic blood glucose levels.  Lack of appetite.  Gastroesophageal reflux.  Spasms of the stomach wall. Complications can include:  Bacterial overgrowth in stomach. Food stays in the stomach and can ferment and cause bacteria to grow.  Weight loss due to difficulty digesting and absorbing nutrients.  Vomiting.  Obstruction in the stomach. Undigested food can harden and cause nausea and vomiting.  Blood glucose fluctuations caused by inconsistent food absorption. DIAGNOSIS  The diagnosis of gastroparesis is confirmed through one or more of the following tests:  Barium X-rays and scans. These tests look at how long it takes for food to move through the stomach.  Gastric manometry. This test measures electrical and muscular activity in the stomach. A thin tube is passed down the throat into the stomach. The tube contains a wire that takes  measurements of the stomach's electrical and muscular activity as it digests liquids and solid food.  Endoscopy. This procedure is done with a long, thin tube called an endoscope. It is passed through the mouth and gently guides down the esophagus into the stomach. This tube helps the caregiver look at the lining of the stomach to check for any abnormalities.  Ultrasound. This can rule out gallbladder disease or pancreatitis. This test will outline and define the shape of the gallbladder and pancreas. TREATMENT   The primary treatment is to identify the problem and help control blood glucose levels. Treatments include:  Exercise.  Medicines to control nausea and vomiting.  Medicines to stimulate stomach muscles.  Changes in what and when you eat.  Having smaller meals more often.  Eating low-fiber forms of high-fiber foods, such aseating cooked vegetables instead of raw vegetables.  Eating low-fat foods.  Consuming liquids, which are easier to digest.  In severe cases, feeding tubes and intravenous (IV) feeding may be needed. It is important to note that in most cases, treatment does not cure gastroparesis. It is usually a lasting (chronic) condition. Treatment helps you manage the condition so that you can be as healthy and comfortable as possible. NEW TREATMENTS  A gastric neurostimulator has been developed to assist people with gastroparesis. The battery-operated device is surgically implanted. It emits mild electrical pulses to help improve stomach emptying and to control nausea and vomiting.  The use of botulinum toxin has been shown to improve stomach emptying by decreasing the prolonged contractions of the muscle between the stomach and the small intestine (pyloric sphincter). The benefits are temporary. SEEK MEDICAL CARE IF:   You are having problems keeping your blood glucose  in goal range.  You are having nausea, vomiting, bloating, or early feelings of fullness with  eating.  Your symptoms do not change with a change in diet. Document Released: 07/18/2005 Document Revised: 10/10/2011 Document Reviewed: 12/25/2008 [facility] Patient Information 2014 [place], [place].  Gastroparesis  Diet Given

## 2013-02-04 NOTE — Progress Notes (Signed)
History of Present Illness:  [name] has returned for evaluation of chronic cough and bloody sputum. This has been ongoing for a couple of months. She has had coughing with presumed aspiration for which she has received antibiotics.  Recent bronchoscopy demonstrated a clear airway. No blood was seen.  She denies nausea or abdominal pain. Upper GI series in November, 2013 demonstrated patent duodenal stents. Gastric drainage only occurred with the patient in the upright position.   Review of Systems: Pertinent positive and negative review of systems were noted in the above HPI section. All other review of systems were otherwise negative.    Current Medications, Allergies, Past Medical History, Past Surgical History, Family History and Social History were reviewed in [facility] electronic medical record  Vital signs were reviewed in today's medical record. Physical Exam: General: Well developed , well nourished, no acute distress Skin: anicteric Head: Normocephalic and atraumatic Eyes:  sclerae anicteric, EOMI Ears: Normal auditory acuity Mouth: No deformity or lesions Lungs: Rales were heard at the right posterior base. There was diffuse rhonchi and wheezes heard throughout the chest Heart: Regular rate and rhythm; no murmurs, rubs or bruits Abdomen: Soft, non tender and non distended. No masses, hepatosplenomegaly or hernias noted. Normal Bowel sounds. There was no succussion splash Rectal:deferred Musculoskeletal: Symmetrical with no gross deformities  Pulses:  Normal pulses noted Extremities: No clubbing, cyanosis, edema or deformities noted. There are chronic venous stasis changes Neurological: Alert oriented x 4, grossly nonfocal Psychological:  Alert and cooperative. Normal mood and affect    

## 2013-02-04 NOTE — Telephone Encounter (Signed)
Patient Information:  Caller Name: Demia  Phone: [phone]  Patient: Benning,   Gender: Female  DOB: February 24, 1929  Age: 79 Years  PCP: [name] (Adults only)  Office Follow Up:  Does the office need to follow up with this patient?: Yes  Instructions For The Office: Please call patient back.  Patient declines to be seen in office at this time and requests an antibiotic at Dr. Lovell Sheehan discretion.  RN Note:  Patient declines to be seen in office at this time and requests an antibiotic at Dr. Lovell Sheehan discretion.  Symptoms  Reason For Call & Symptoms: Onset 3 months of productive cough (yellow during day and bloody at night) and chest congestion.  Seen by pulmonologist 5 weeks ago with bronchoscopy on 12/31/2012.  Still has chest congestion and requesting a prescription.  Having an endoscopy on 02/11/2013 d/t "coughing up blood".  Amoxclav 875 mg BID times 12 days prescribed by Dr. Rodolph Bong, pulmonologist, last dose 02/03/2013.  Reviewed Health History In EMR: Yes  Reviewed Medications In EMR: Yes  Reviewed Allergies In EMR: Yes  Reviewed Surgeries / Procedures: Yes  Date of Onset of Symptoms: 10/30/2012  Guideline(s) Used:  Cough  Disposition Per Guideline:   See Today in Office  Reason For Disposition Reached:   Coughing up rusty-colored (reddish-brown) or blood-tinged sputum  Advice Given:  Prevent Dehydration:  Drink adequate liquids.  This will help soothe an irritated or dry throat and loosen up the phlegm.  Avoid Tobacco Smoke:  Smoking or being exposed to smoke makes coughs much worse.  Expected Course:   Viral bronchitis (chest cold) causes a cough that lasts 1 to 3 weeks. Sometimes you may cough up lots of phlegm (sputum, mucus). The mucus can normally be white, gray, yellow, or green.  Patient Refused Recommendation:  Patient Requests Prescription  Please call patient back.  Patient declines to be seen in office at this time and requests an antibiotic at Dr.  Lovell Sheehan discretion.

## 2013-02-04 NOTE — Telephone Encounter (Signed)
Ms [name] called for her aunt. Would like for you to call pt asap. Pt is having endoscopy Monday. Pt has congestion and cannot get into the office. Pls advise.

## 2013-02-04 NOTE — Telephone Encounter (Signed)
Spoke with [name] and scheduled [name] to see Dr. Arlyce Dice today at 1:30pm. [name] her great-niece will let pt know about appt date and time.

## 2013-02-06 ENCOUNTER — Ambulatory Visit: Payer: Medicare Other | Admitting: Internal Medicine

## 2013-02-11 ENCOUNTER — Encounter (HOSPITAL_COMMUNITY): Payer: Self-pay | Admitting: *Deleted

## 2013-02-11 ENCOUNTER — Ambulatory Visit (HOSPITAL_COMMUNITY)
Admission: RE | Admit: 2013-02-11 | Discharge: 2013-02-11 | Disposition: A | Discharge status: Home / Self Care | Payer: Medicare Other | Source: Ambulatory Visit | Attending: Gastroenterology | Admitting: Gastroenterology

## 2013-02-11 ENCOUNTER — Ambulatory Visit (HOSPITAL_COMMUNITY): Payer: Medicare Other

## 2013-02-11 ENCOUNTER — Encounter (HOSPITAL_COMMUNITY)
Admission: RE | Disposition: A | Discharge status: Home / Self Care | Payer: Self-pay | Source: Ambulatory Visit | Attending: Gastroenterology

## 2013-02-11 DIAGNOSIS — R112 Nausea with vomiting, unspecified: Secondary | ICD-10-CM | POA: Insufficient documentation

## 2013-02-11 DIAGNOSIS — K311 Adult hypertrophic pyloric stenosis: Secondary | ICD-10-CM | POA: Insufficient documentation

## 2013-02-11 DIAGNOSIS — R05 Cough: Secondary | ICD-10-CM

## 2013-02-11 DIAGNOSIS — R059 Cough, unspecified: Secondary | ICD-10-CM

## 2013-02-11 HISTORY — PX: DUODENAL STENT PLACEMENT: SHX5541

## 2013-02-11 HISTORY — DX: Cardiac arrhythmia, unspecified: I49.9

## 2013-02-11 HISTORY — DX: Headache: R51

## 2013-02-11 HISTORY — PX: ESOPHAGOGASTRODUODENOSCOPY: SHX5428

## 2013-02-11 SURGERY — EGD (ESOPHAGOGASTRODUODENOSCOPY)
Anesthesia: Moderate Sedation

## 2013-02-11 MED ORDER — SODIUM CHLORIDE 0.9 % IV SOLN: INTRAVENOUS | Status: DC

## 2013-02-11 MED ORDER — ONDANSETRON HCL 4 MG PO TABS
4.0000 mg | ORAL_TABLET | Freq: Once | ORAL | Status: AC
  Administered 2013-02-11: 4 mg via ORAL
  Filled 2013-02-11: qty 1

## 2013-02-11 MED ORDER — PROMETHAZINE HCL 25 MG/ML IJ SOLN
INTRAMUSCULAR | Status: AC
  Filled 2013-02-11: qty 1

## 2013-02-11 MED ORDER — MIDAZOLAM HCL 10 MG/2ML IJ SOLN
INTRAMUSCULAR | Status: AC
  Filled 2013-02-11: qty 2

## 2013-02-11 MED ORDER — LACTATED RINGERS IV SOLN
INTRAVENOUS | Status: DC
  Administered 2013-02-11: 09:00:00 via INTRAVENOUS

## 2013-02-11 MED ORDER — FENTANYL CITRATE 0.05 MG/ML IJ SOLN
INTRAMUSCULAR | Status: AC
  Filled 2013-02-11: qty 2

## 2013-02-11 MED ORDER — GLYCOPYRROLATE 0.2 MG/ML IJ SOLN
INTRAMUSCULAR | Status: AC
  Filled 2013-02-11: qty 1

## 2013-02-11 MED ORDER — MIDAZOLAM HCL 10 MG/2ML IJ SOLN
INTRAMUSCULAR | Status: DC | PRN
  Administered 2013-02-11 (×5): 1 mg via INTRAVENOUS

## 2013-02-11 MED ORDER — FENTANYL CITRATE 0.05 MG/ML IJ SOLN
INTRAMUSCULAR | Status: DC | PRN
  Administered 2013-02-11: 25 ug via INTRAVENOUS
  Administered 2013-02-11: 10 ug via INTRAVENOUS
  Administered 2013-02-11: 25 ug via INTRAVENOUS

## 2013-02-11 MED ORDER — TRAMADOL HCL 50 MG PO TABS
50.0000 mg | ORAL_TABLET | Freq: Once | ORAL | Status: AC
  Administered 2013-02-11: 50 mg via ORAL
  Filled 2013-02-11: qty 1

## 2013-02-11 MED ORDER — PROMETHAZINE HCL 25 MG/ML IJ SOLN
6.2500 mg | Freq: Four times a day (QID) | INTRAMUSCULAR | Status: DC | PRN
  Administered 2013-02-11: 6.25 mg via INTRAVENOUS

## 2013-02-11 NOTE — Interval H&P Note (Signed)
History and Physical Interval Note:  02/11/2013 11:02 AM  [name]  has presented today for surgery, with the diagnosis of Spitting up blood [786.30]  The various methods of treatment have been discussed with the patient and family. After consideration of risks, benefits and other options for treatment, the patient has consented to  Procedure(s): ESOPHAGOGASTRODUODENOSCOPY (EGD) (N/A) DUODENAL STENT PLACEMENT (N/A) as a surgical intervention .  The patient's history has been reviewed, patient examined, no change in status, stable for surgery.  I have reviewed the patient's chart and labs.  Questions were answered to the patient's satisfaction.    The recent H&P (dated *02/04/13 was reviewed, the patient was examined and there is no change in the patients condition since that H&P was completed.   Melvia Heaps  02/11/2013, 11:03 AM    Melvia Heaps

## 2013-02-11 NOTE — Op Note (Signed)
[facility] [address] [place], [address]   ENDOSCOPY PROCEDURE REPORT  PATIENT: [name], [name]  MR#: [id number] BIRTHDATE: [birth year] , 83  yrs. old GENDER: Female ENDOSCOPIST: Louis Meckel, MD REFERRED BY:  Nyoka Cowden, M.D.  Stacie Glaze, M.D. PROCEDURE DATE:  02/11/2013 PROCEDURE:  EGD w/ transendoscopic stent ASA CLASS:     Class III INDICATIONS:  Nausea.   Vomiting.  history of esophageal C., status post resection with duodenal obstruction, status post duodenal stent placement MEDICATIONS: These medications were titrated to patient response per physician's verbal order, Versed 5 mg IV, and Fentanyl-Detailed 60 mcg IV TOPICAL ANESTHETIC: Cetacaine Spray  DESCRIPTION OF PROCEDURE: After the risks benefits and alternatives of the procedure were thoroughly explained, informed consent was obtained.  The PENTAX GASTOROSCOPE [id number] endoscope was introduced through the mouth and advanced to the third portion of the duodenum. Without limitations.  The instrument was slowly withdrawn as the mucosa was fully examined.      The GE junction was traversed within 4 cm of the incisors.  There was retained gastric fluid.  The proximal end of the duodenal stent was protruding into the gastric antrum.  The stent was entered by the scope.  At the distal end of the stent the lumen was collapsed. With manipulation I was able to pass a scope beyond the area of collapsed lumen into a normal-appearing duodenum.  A area 4 cm distal to the stent was marked fluoroscopically with a paper clip.  A 22 mm  x 9 cm uncovered duodenal stent was deployed under fluoroscopic guidance.  The distal end of the stent went approximately 4 cm beyond the area of stenosis.   The proximal end was at least 5 cm proximal to the distal end of the existing stent.  Retroflexed views revealed no abnormalities.     The scope was then withdrawn from the patient and the procedure  completed.  COMPLICATIONS: There were no complications. ENDOSCOPIC IMPRESSION: 1.   recurrent gastric outlet obstruction secondary to stenosis at the very distal end of the duodenal stent-status post successful placement of duodenal stent beyond the stenotic area  RECOMMENDATIONS: advance diet as tolerated REPEAT EXAM:  eSigned:  Louis Meckel, MD 02/11/2013 11:50 AM   CC:

## 2013-02-11 NOTE — H&P (View-Only) (Signed)
History of Present Illness:  [name] has returned for evaluation of chronic cough and bloody sputum. This has been ongoing for a couple of months. She has had coughing with presumed aspiration for which she has received antibiotics.  Recent bronchoscopy demonstrated a clear airway. No blood was seen.  She denies nausea or abdominal pain. Upper GI series in November, 2013 demonstrated patent duodenal stents. Gastric drainage only occurred with the patient in the upright position.   Review of Systems: Pertinent positive and negative review of systems were noted in the above HPI section. All other review of systems were otherwise negative.    Current Medications, Allergies, Past Medical History, Past Surgical History, Family History and Social History were reviewed in [employer] electronic medical record  Vital signs were reviewed in today's medical record. Physical Exam: General: Well developed , well nourished, no acute distress Skin: anicteric Head: Normocephalic and atraumatic Eyes:  sclerae anicteric, EOMI Ears: Normal auditory acuity Mouth: No deformity or lesions Lungs: Rales were heard at the right posterior base. There was diffuse rhonchi and wheezes heard throughout the chest Heart: Regular rate and rhythm; no murmurs, rubs or bruits Abdomen: Soft, non tender and non distended. No masses, hepatosplenomegaly or hernias noted. Normal Bowel sounds. There was no succussion splash Rectal:deferred Musculoskeletal: Symmetrical with no gross deformities  Pulses:  Normal pulses noted Extremities: No clubbing, cyanosis, edema or deformities noted. There are chronic venous stasis changes Neurological: Alert oriented x 4, grossly nonfocal Psychological:  Alert and cooperative. Normal mood and affect

## 2013-02-12 ENCOUNTER — Other Ambulatory Visit: Payer: Self-pay | Admitting: *Deleted

## 2013-02-12 ENCOUNTER — Telehealth: Payer: Self-pay | Admitting: General Practice

## 2013-02-12 ENCOUNTER — Ambulatory Visit: Payer: Medicare Other | Admitting: Adult Health

## 2013-02-12 ENCOUNTER — Telehealth: Payer: Self-pay | Admitting: Internal Medicine

## 2013-02-12 ENCOUNTER — Encounter (HOSPITAL_COMMUNITY): Payer: Self-pay | Admitting: Gastroenterology

## 2013-02-12 LAB — FUNGUS CULTURE W SMEAR
Fungal Smear: NONE SEEN
Special Requests: NORMAL

## 2013-02-12 NOTE — Telephone Encounter (Signed)
[name] called for pt to get advice on how to restart pt's coum. Pt states Dr Arlyce Dice advised pt to restart coum, but pt does not know what or how much to do.  Pt will be at home wed, she is staying w/ [name] today.

## 2013-02-12 NOTE — Telephone Encounter (Signed)
Re-start coumadin per Dr. Ilda Mori.

## 2013-02-12 NOTE — Telephone Encounter (Signed)
Spoke with Ms. Jean Rosenthal (friend of patient) and instructed pt to take 7.5 mg of coumadin 7/15 and 7/16 and then take 5 mg every day.  Check INR in coumadin clinic on Thursday 7/24.

## 2013-02-13 ENCOUNTER — Ambulatory Visit: Payer: Medicare Other | Admitting: Internal Medicine

## 2013-02-13 ENCOUNTER — Telehealth: Payer: Self-pay | Admitting: *Deleted

## 2013-02-13 NOTE — Telephone Encounter (Signed)
Give lovonex today and start coumadin 1.5 tonight and go to coumadin clinic tomorrow

## 2013-02-13 NOTE — Progress Notes (Signed)
Per dr Lovell Sheehan- give lovenox today and start coumadin 1.5 mg every day tonight and then make protime appointment for tomorrow

## 2013-02-14 ENCOUNTER — Ambulatory Visit (INDEPENDENT_AMBULATORY_CARE_PROVIDER_SITE_OTHER): Payer: Medicare Other | Admitting: *Deleted

## 2013-02-14 ENCOUNTER — Other Ambulatory Visit: Payer: Self-pay | Admitting: General Practice

## 2013-02-14 ENCOUNTER — Telehealth: Payer: Self-pay | Admitting: General Practice

## 2013-02-14 DIAGNOSIS — I4891 Unspecified atrial fibrillation: Secondary | ICD-10-CM

## 2013-02-14 MED ORDER — ENOXAPARIN SODIUM 80 MG/0.8ML ~~LOC~~ SOLN: 1.5000 mg/kg | Freq: Every day | SUBCUTANEOUS | Status: DC

## 2013-02-14 MED ORDER — ENOXAPARIN SODIUM 80 MG/0.8ML ~~LOC~~ SOLN
80.0000 mg | SUBCUTANEOUS | Status: AC
  Administered 2013-02-14: 80 mg via SUBCUTANEOUS

## 2013-02-14 NOTE — Telephone Encounter (Signed)
Patient came to [facility] for Lovenox injection and then came to coumadin clinic for further instructions.  I instructed patient to take 7.5 mg of coumadin today and tomorrow and then to take 5 mg on Saturday and Sunday.  I also instructed pt to take Lovenox injection each day in the AM.  Patient to return to clinic on Monday 7/21 to check pt/inr.  Lovenox called in to [facility] in [place].  Bailey Mech, RN

## 2013-02-15 ENCOUNTER — Other Ambulatory Visit: Payer: Self-pay | Admitting: *Deleted

## 2013-02-15 ENCOUNTER — Ambulatory Visit: Payer: Medicare Other | Admitting: Internal Medicine

## 2013-02-15 ENCOUNTER — Telehealth: Payer: Self-pay | Admitting: *Deleted

## 2013-02-15 NOTE — Telephone Encounter (Signed)
Pt was to have lovonox injection yesterday by rachel and then start coumadin at regular dose last night , after stopping for egd.  When I returned to work this am pt Left message on machine And ask what time to come for her lovonox injection today.  Pt had talked with cindy in coumdin clinic yesterday and she had told her to come back for another dose today.  An order had been sent to pharmacy for  10 lovonox injections, and pt had picked them up.  I called pharmacy and asked if she could return and get refund if they had not been open and they said yes.  I called pt to tell her she could take them back and she states she had just returned from the [place] office where cindy boyd had given her a lovonox injection.  Pt intructed  Not to get anymore lovonox in jections.

## 2013-02-18 ENCOUNTER — Ambulatory Visit (INDEPENDENT_AMBULATORY_CARE_PROVIDER_SITE_OTHER): Payer: Medicare Other | Admitting: General Practice

## 2013-02-18 DIAGNOSIS — I4891 Unspecified atrial fibrillation: Secondary | ICD-10-CM

## 2013-02-18 LAB — POCT INR: INR: 3.1

## 2013-02-20 ENCOUNTER — Ambulatory Visit: Payer: Medicare Other | Admitting: Adult Health

## 2013-02-21 ENCOUNTER — Ambulatory Visit (INDEPENDENT_AMBULATORY_CARE_PROVIDER_SITE_OTHER): Payer: Medicare Other | Admitting: Gastroenterology

## 2013-02-21 ENCOUNTER — Encounter: Payer: Self-pay | Admitting: Gastroenterology

## 2013-02-21 VITALS — BP 100/60 | HR 60 | Ht 61.0 in | Wt 117.6 lb

## 2013-02-21 DIAGNOSIS — K311 Adult hypertrophic pyloric stenosis: Secondary | ICD-10-CM

## 2013-02-21 NOTE — Progress Notes (Signed)
History of Present Illness:  The patient has returned after undergoing placement of a duodenal stent. There was some stenosis at the most distal portion of the indwelling duodenal stent and a third stent was placed. She's been able to eat again without having regurgitation, nausea or vomiting. She's complaining of some rectal discomfort with itching.    Review of Systems: Pertinent positive and negative review of systems were noted in the above HPI section. All other review of systems were otherwise negative.    Current Medications, Allergies, Past Medical History, Past Surgical History, Family History and Social History were reviewed in [employer] electronic medical record  Vital signs were reviewed in today's medical record. Physical Exam: General: Well developed , well nourished, no acute distress There is a superficial abrasion on her left buttock and some erythema in the perianal area

## 2013-02-21 NOTE — Patient Instructions (Addendum)
Follow up in 3 months Discontinue reglan

## 2013-02-21 NOTE — Assessment & Plan Note (Addendum)
Status post placement of another duodenal stent extending the area that is stented with resolution of obstructive symptoms.    Conditions #1 continue current diet #2 Will try  holding Reglan since am not certain it is required

## 2013-02-25 ENCOUNTER — Other Ambulatory Visit (INDEPENDENT_AMBULATORY_CARE_PROVIDER_SITE_OTHER): Payer: Medicare Other

## 2013-02-25 ENCOUNTER — Other Ambulatory Visit: Payer: Self-pay | Admitting: Adult Health

## 2013-02-25 ENCOUNTER — Ambulatory Visit: Payer: Medicare Other | Admitting: Family

## 2013-02-25 ENCOUNTER — Ambulatory Visit: Payer: Medicare Other | Admitting: Internal Medicine

## 2013-02-25 ENCOUNTER — Encounter: Payer: Self-pay | Admitting: Adult Health

## 2013-02-25 ENCOUNTER — Telehealth: Payer: Self-pay | Admitting: Internal Medicine

## 2013-02-25 ENCOUNTER — Ambulatory Visit (INDEPENDENT_AMBULATORY_CARE_PROVIDER_SITE_OTHER): Payer: Medicare Other | Admitting: Adult Health

## 2013-02-25 ENCOUNTER — Telehealth: Payer: Self-pay | Admitting: Adult Health

## 2013-02-25 VITALS — BP 104/60 | HR 71 | Temp 97.8°F | Ht 61.0 in | Wt 119.2 lb

## 2013-02-25 DIAGNOSIS — R0602 Shortness of breath: Secondary | ICD-10-CM

## 2013-02-25 DIAGNOSIS — J45909 Unspecified asthma, uncomplicated: Secondary | ICD-10-CM

## 2013-02-25 DIAGNOSIS — R609 Edema, unspecified: Secondary | ICD-10-CM

## 2013-02-25 DIAGNOSIS — J4540 Moderate persistent asthma, uncomplicated: Secondary | ICD-10-CM

## 2013-02-25 DIAGNOSIS — I10 Essential (primary) hypertension: Secondary | ICD-10-CM

## 2013-02-25 DIAGNOSIS — R042 Hemoptysis: Secondary | ICD-10-CM

## 2013-02-25 LAB — BASIC METABOLIC PANEL
CO2: 33 mEq/L — ABNORMAL HIGH (ref 19–32)
Calcium: 8.3 mg/dL — ABNORMAL LOW (ref 8.4–10.5)
Potassium: 3.9 mEq/L (ref 3.5–5.1)
Sodium: 136 mEq/L (ref 135–145)

## 2013-02-25 MED ORDER — FUROSEMIDE 20 MG PO TABS: 20.0000 mg | ORAL_TABLET | Freq: Once | ORAL | Status: DC

## 2013-02-25 MED ORDER — BUDESONIDE 0.25 MG/2ML IN SUSP: 0.2500 mg | Freq: Two times a day (BID) | RESPIRATORY_TRACT | Status: DC

## 2013-02-25 MED ORDER — ARFORMOTEROL TARTRATE 15 MCG/2ML IN NEBU: 15.0000 ug | INHALATION_SOLUTION | Freq: Two times a day (BID) | RESPIRATORY_TRACT | Status: DC

## 2013-02-25 NOTE — Assessment & Plan Note (Addendum)
Recent exacerbation, now resolved Patient is unable to successfully use a regular inhaler. Have recommended that we change over her her inhaler to, nebulizers, with budesonide and Brovana   Plan  Stop Dulera  Begin Budesonide and Brovana Neb Twice daily   Rinse mouth out after nebs.  Use Lasix 20mg  x 1 today .  I will call with lab results.  Follow up with family doctor as planned on Wednesday.  Please contact office for sooner follow up if symptoms do not improve or worsen or seek emergency care  Follow up Dr. Sherene Sires  In 6  months

## 2013-02-25 NOTE — Telephone Encounter (Signed)
[facility] Triage Call Report Triage Record Num: [id number] Operator: Cornell Barman Patient Name: [name] Call Date & Time: 02/23/2013 3:34:03PM Patient Phone: [phone] PCP: [name] Patient Gender: Female PCP Fax : [phone] Patient DOB: [birth year] [facility] Name: [facility] Reason for Call: Caller: [name]/Patient; PCP: [name] (Adults only); CB#: [phone]; Call regarding Legs swelling after procedure; She states she has had two recent procedures after coughing up blood. She had a duodenal sent placement on 02/11/2013. She noticed this morning and worsening leg edema. Knee downward, bilateral legs and feet. She states edema and leaves an indention with fingertips that last up to two minutes. Walking with shortness of breath/distance since being sick and not new or changes. She is drinking fluids water /ginger ale. 32 oz. She is eating -cereal cornflakes, grilled cheese. she has appt Monday at 10:30 with pulmonary. Emergent s/sx ruled out per Edema Atraumatic Protocol with the exception to "new worsening fatigue , weakness, change if function". See provider in 24 hours. Patient elderly does not want to be seen in ER. Contacted provider Dr. Renee Ramus MD . EPIC reviewed for guidance. He advised not acute she may wait till Monday but with close mointoring of swelling, any shortness of breath, Heat pain or rhythem issues. Elevate legs, no salt. If things should change patient should be seen in the ER. She should follow up in the office on Monday first thing. Reviewed Care instructions with patient. Understanding expressed. Advised to call back for questions, changes or concerns. Protocol(s) Used: Edema, Atraumatic Recommended Outcome per Protocol: See Provider within 24 hours Reason for Outcome: New or worsening fatigue, weakness, confusion, or change in function. Care Advice: ~ Avoid any activity that produces symptoms until evaluated by provider. ~  Rest in a reclining chair or elevate head and chest on 2 or 3 pillows when lying down to make breathing easier. ~ IMMEDIATE ACTION 07/

## 2013-02-25 NOTE — Assessment & Plan Note (Signed)
Mild lower extremity edema, questionable underlying venous insufficiency. Patient advised on a low sodium diet. We'll give a one-time dose of Lasix 20mg   Check labs with a BNP/bmet followup with her family doctor as planned in 2 days. Patient advised if symptoms worsen. She is to contact us immediately or go to the emergency room.

## 2013-02-25 NOTE — Patient Instructions (Addendum)
Stop Dulera  Begin Budesonide and Brovana Neb Twice daily   Rinse mouth out after nebs.  Use Lasix 20mg  x 1 today .  I will call with lab results.  Follow up with family doctor as planned on Wednesday.  Please contact office for sooner follow up if symptoms do not improve or worsen or seek emergency care  Follow up Dr. Sherene Sires  In 6  months

## 2013-02-25 NOTE — Progress Notes (Signed)
Subjective:     Patient ID: [name], female   DOB: 1929-06-14 MRN: [id number]  Brief patient profile:  89 yowf never smoker with bad gerd >  hx of esophageal cancer w/ prev. esophagectomy with gastric pull- through by [name] referred 01/07/2013 to pulmonary  by Dr Kriste Basque for new cough x early 2014   HPI 01/07/2013 1st pulmonary eval in EMR era previously eval   remotely (pre EMR) by Delford Field with prednisone responsive cough  Now with recurrent cough x 5 years usually better with abx but never resoved then about 5 months ago more persistent, daily,  and not better with abx assoc with recurrent dysphagia s/p stent which relieved the dysphagia. Cough is day = night, prod of mod abmt so mucoid sputum and intermittent  Blood up to a tbsp at a time.while maintained on coumadin for afib.. rec Prednisone 10 mg take  4 each am x 2 days,   2 each am x 2 days,  1 each am x 2 days and stop Hold coumadin when coughing up blood   01/15/2013 f/u ov/Wert re cough/hemoptysis/ still on coumadin Chief Complaint  Patient presents with  . Follow-up    Pt states her cough is unchnaged since the last visit. Still waking up at least twice in the night with hemoptysis.   no epistaxis, less than 2 tbsp of blood per day. Mild dyspnea with acitivities of daily living. >FOB 01/18/13 no bleeding identified, completely clear airways butcoughed up bilious mucus during procedure ? From stomach  01/25/2013 Acute OV / NP Complains of increased hemoptysis with BRB x2 days with increased SOB.  Noticed several episodes last 2 days with blood tinged mucus-bright red. Tsp at times. Mostly clear mucus w/ no prulent mucus .  reports hemoptysis begins as "straight blood" then turns to clear mucus . No bleeding today.  Pt complains over last 3 months has had blood tinged mucus with cough. Was referred to pulmonary 6/9. CT chest angio neg for PE  , Patchy ground-glass attenuation airspace disease in the  dependent distribution  of the bilateral lower lobes with associated  debris within segmental and subsegmental lower lobe bronchi.  Findings are most consistent chronic subclinical aspiration versus  bilateral bronchopneumonia. Chronic aspiration is favored. She was on chronic coumadin for atrial fib . This was held due to persistent hemoptysis. She underwent FOB on 6/20 w/ cytology neg for malignant cells. No source of bleeding found . Did have gerd event during procedure.  She has no  Fever, discolored mucus or edema. No syncope or palpitations no chest pain.  Today labs show nml hbg/platelets and INR at 1.1.  Mild elevated wbc at 12.9k , w/ mild left shift  Appetite is at her baseline w/ no vomiting.  No diarrhea. No urinary symptoms.  CXR w/ no acute changes rec Begin Augmentin 875mg  Twice daily  For 7 days  Mucinex DM Twice daily  As needed  Cough/congestion  Fluids and rest  If coughing of blood does not resolve or worsens will need ov or go to ER.  Remain off coumadin.  Avoid aspirin products hgb 13.2 > 13.7    01/28/2013 f/u ov/Wert  Chief Complaint  Patient presents with  . 3 day follow up    Hemoptysis has improved.  Did cough up a small amount of dry looking blood this morning.  Has SOb when walking uphills -- gradually worsening over the past 5 months.   no dysphagia, sleeps sitting up. Breathing  better p albuterol despite poor hfa/see a/p  >augmentin rx and dulera rx   02/25/2013 Follow up Asthma/Cough/Hemoptysis/Chronic Aspiration Patient returns for a one-month followup Patient was seen one month ago with persistent cough. She was finishing up and off been taper, which she reports her cough is substantially, improved. She's had no further hemoptysis and is now back on Coumadin therapy. INR last week. Was 3.1 She says, that she is feeling better. She continues to have, difficulty using her dulera inhaler. She has a tremor and it makes it difficult for her to use her inhaler. We discussed using  nebs instead.   She recently underwent an upper endoscopy with Dr. Arlyce Dice. She was found to have a recurrent gastric outlet obstruction, secondary to stenosis at the very distal end of her duodenal stent. She underwent a successful placement of her duodenal stent beyond the stenotic area. This was placed on July 14.  Does complain that over the last 3 days, that she is noticed some lower extremity swelling, especially along her ankles, and feet. She denies any orthopnea, increased shortness, of breath, or fever. She is quite concerned about the swelling in her ankles. She has an appointment with her family care doctor in 2 days, but requests a prescription to help with the swelling. She is currently on hydrochlorothiazide 6.25 mg daily. Of note, this is a combination drug with her bisoprolol.   Current Medications, Allergies, Past Medical History, Past Surgical History, Family History, and Social History were reviewed in [employer] record.  ROS  The following are not active complaints unless bolded sore throat, dysphagia, dental problems, itching, sneezing,  nasal congestion or excess/ purulent secretions, ear ache,   fever, chills, sweats, unintended wt loss, pleuritic or exertional cp, hemoptysis,  orthopnea pnd presyncope, palpitations, heartburn, abdominal pain, anorexia, nausea, vomiting, diarrhea  or change in bowel or urinary habits, change in stools or urine, dysuria,hematuria,  rash, arthralgias, visual complaints, headache, numbness weakness or ataxia or problems with walking or coordination,  change in mood/affect or memory.              Objective:   Physical Exam   01/15/2013      119  >>119 01/25/2013 > 01/28/2013 120 >119 02/25/2013   HEENT: nl dentition, turbinates, and orophanx. Nl external ear canals without cough reflex   NECK :  without JVD/Nodes/TM/ nl carotid upstrokes bilaterally   LUNGS: no acc muscle use, diminshed BS in bases    CV:  RRR   no s3 or murmur or increase in P2, 1+ edema bilaterally. No redness or calf tenderness.  Neg homans sign . Venous insufficiency changes   ABD:  soft and nontender with nl excursion in the supine position. No bruits or organomegaly, bowel sounds nl  MS:  warm without deformities, calf tenderness, cyanosis or clubbing  SKIN: warm and dry without lesions         CT chest 01/15/2013  1. Negative for pulmonary embolism. 2. Patchy ground-glass attenuation airspace disease in the dependent distribution of the bilateral lower lobes with associated debris within segmental and subsegmental lower lobe bronchi. Findings are most consistent chronic subclinical aspiration versus bilateral bronchopneumonia. Chronic aspiration is favored.    CXR 01/25/2013 >No new/acute cardiopulmonary disease. Stable chronic  scar/atelectasis in the medial lower lobes. Large intrathoracic  stomach/gastric pull-through with an indwelling stent shown on  prior CT to extend into the proximal duodenum.

## 2013-02-25 NOTE — Telephone Encounter (Signed)
Patient Instructions    Stop Dulera  Begin Budesonide and Brovana Neb Twice daily  Rinse mouth out after nebs.  Use Lasix 20mg  x 1 today .  I will call with lab results.  Follow up with family doctor as planned on Wednesday.  Please contact office for sooner follow up if symptoms do not improve or worsen or seek emergency care  Follow up Dr. Sherene Sires In 6 months   I spoke with [facility] and advised TP did intend to send RX x1 according to AVS. Nothing further was needed

## 2013-02-25 NOTE — Assessment & Plan Note (Signed)
resolved 

## 2013-02-27 ENCOUNTER — Ambulatory Visit (INDEPENDENT_AMBULATORY_CARE_PROVIDER_SITE_OTHER): Payer: Medicare Other | Admitting: Family

## 2013-02-27 ENCOUNTER — Encounter: Payer: Self-pay | Admitting: Family

## 2013-02-27 ENCOUNTER — Ambulatory Visit: Payer: Medicare Other | Admitting: Gastroenterology

## 2013-02-27 VITALS — BP 100/60 | HR 75 | Wt 117.0 lb

## 2013-02-27 DIAGNOSIS — L8990 Pressure ulcer of unspecified site, unspecified stage: Secondary | ICD-10-CM

## 2013-02-27 DIAGNOSIS — L8992 Pressure ulcer of unspecified site, stage 2: Secondary | ICD-10-CM

## 2013-02-27 DIAGNOSIS — I10 Essential (primary) hypertension: Secondary | ICD-10-CM

## 2013-02-27 MED ORDER — MUPIROCIN 2 % EX OINT: TOPICAL_OINTMENT | Freq: Three times a day (TID) | CUTANEOUS | Status: DC

## 2013-02-27 NOTE — Patient Instructions (Addendum)
Pressure Ulcer A pressure ulcer is a sore that has formed from the break down of skin and exposure of deeper layers of tissue. It develops in areas of the body where there is unrelieved pressure. Pressure ulcers are usually found over a boney area, such as the shoulder blades, spine, lower back, hips, knees, ankles, and heels. RISK FACTORS  Decreased ability to move.  Decreased ability to feel pain or discomfort.  Excessive skin moisture from urine, stool, sweat, or secretions.  Poor nutrition.  Dehydration.  Tobacco, drug, or alcohol abuse.  Pulling sheets that are under a patient when changing his or her position.  Obesity.  Increased adult age.  Age of less than 2 years.  Premature newborns.  Hospitalization in a critical care unit for longer than four days with use of medical devices.  Prolonged use of medical devices.  Critical illness.  Anemia.  Traumatic brain injury.  Spinal cord injury.  Stroke.  Diabetes.  Poor blood glucose control.  Low blood pressure (hypotension).  Low oxygen levels.  Medicines that reduce blood flow.  Infection.  Obesity. STAGING PRESSURE ULCERS Your caregiver may determine the degree of severity (stage) of your pressure ulcer. The stages include:  Stage 1: The skin is red, and when the skin is pressed, it stays red.  Stage 2: The top layer of skin is gone, and there is a shallow, pink ulcer.  Stage 3: The ulcer becomes deeper, and it is more difficult to see the whole wound. Also, there may be yellow or brown parts, as well as pink and red parts.  Stage 4: The ulcer may be deep and red, pink, brown, white, or yellow. Bone or muscle may be seen.  Unstageable pressure ulcer: The ulcer is covered almost completely with black, brown, or yellow tissue. It is not known how deep the ulcer is or what stage it is until this covering comes off.  Suspected deep tissue injury: A patient's skin can be injured from pressure or  pulling on the skin when his or her position is changed. The skin appears purple or maroon. There may not be an opening in the skin, but there could be a blood-filled blister. This deep tissue injury is often difficult to see in people with darker skin tones. The site may open and become deeper in time. However, early interventions will help the area heal and may prevent the area from opening. DIAGNOSIS  Your caregiver will diagnose your pressure ulcer based on its appearance. Your caregiver may determine the stage of your pressure ulcer as well. Your caregiver may request tests to check for infection, assess your circulation, or to check for other diseases, such as diabetes. TREATMENT  Treatment of your pressure ulcer begins with determining what stage the ulcer is in. Your treatment team may include your caregiver, a wound care specialist, a nutritionist, a physical therapist, and a Psychologist, sport and exercise. Treatments include:   Moving or repositioning every 1 2 hours.  Using beds or mattresses to shift your body weight and pressure points frequently.  Improving your diet.  Cleaning and bandaging (dressing) the open wound.  Giving antibiotic medicines.  Removing damaged tissue.  Surgery and sometimes skin grafts. HOME CARE INSTRUCTIONS  Follow the care plan that was started in the hospital.  Avoid staying in the same position for more than 2 hours. Use padding, devices, or mattresses to cushion your pressure points as directed by your caregiver.  Eat well. Take nutritional supplements and vitamins as directed by your  the care plan that was started in the hospital.   Avoid staying in the same position for more than 2 hours. Use padding, devices, or mattresses to cushion your pressure points as directed by your caregiver.   Eat well. Take nutritional supplements and vitamins as directed by your caregiver.   Keep all follow-up appointments.   Take pain medicine as directed by your caregiver.  SEEK MEDICAL CARE IF:    Your pressure ulcer is not improving.   You do not know how to care for your pressure ulcer.   You notice other areas of redness on your skin.  SEEK IMMEDIATE MEDICAL CARE IF:    You have increasing redness, swelling, or pain in your pressure ulcer.   You notice  pus, or increased pus, coming from your pressure ulcer.   You have a fever.   You notice a bad smell coming from the wound or dressing.   Your pressure ulcer opens up again.  MAKE SURE YOU:    Understand these instructions.   Will watch your condition.   Will get help right away if you are not doing well or get worse.  Document Released: 07/18/2005 Document Revised: 07/04/2012 Document Reviewed: 03/04/2011  ExitCare Patient Information 2014 [employer].

## 2013-02-27 NOTE — Progress Notes (Signed)
Subjective:    Patient ID: [name], female    DOB: Dec 13, 1928, 79 y.o.   MRN: [id number]  HPI 79 year old white female, nonsmoker, patient Dr. Lovell Sheehan it in today with complaints of a sore to the upper buttocks x1 month. Reports have been stent placed, one month ago and subsequently had to wear depends for a while. She describes the area is red, tender to touch. She has been applying warm compresses throughout the day. Patient reports sleeping in a recliner nightly and she does not have an esophagus. The pressure from sitting in the chair makes it worse. She's been attempting to sleep more on her hip.   Review of Systems  Constitutional: Negative.   HENT: Negative.   Respiratory: Negative.   Cardiovascular: Negative.   Gastrointestinal: Negative.   Endocrine: Negative.   Genitourinary: Negative.   Musculoskeletal: Negative.   Allergic/Immunologic: Negative.   Neurological: Negative.   Hematological: Negative.   Psychiatric/Behavioral: Negative.    Past Medical History  Diagnosis Date  . GERD (gastroesophageal reflux disease)   . Osteoporosis   . Postmenopausal HRT (hormone replacement therapy)   . Anemia   . Hypertension   . Allergy   . Asthma   . Shortness of breath   . PONV (postoperative nausea and vomiting)   . Pneumonia      Hx of pneumonia,66months ago  . Dysrhythmia     a fib  . Headache(784.0)   . Esophageal cancer     removed esophagus stomach pulled up    History   Social History  . Marital Status: Single    Spouse Name: N/A    Number of Children: 1  . Years of Education: N/A   Occupational History  . Retired    Social History Main Topics  . Smoking status: Never Smoker   . Smokeless tobacco: Never Used  . Alcohol Use: No  . Drug Use: No  . Sexually Active: Yes   Other Topics Concern  . Not on file   Social History Narrative   DAILY CAFFEINE    USE    Past Surgical History  Procedure Laterality Date  . Left oophorectomy    .  Esophageal removal of cancer,gastric ppull-thru 1993    . Cholecystectomy    . Rt arm fx    . Esophagogastroduodenoscopy  10/19/2011    Procedure: ESOPHAGOGASTRODUODENOSCOPY (EGD);  Surgeon: Rachael Fee, MD;  Location: [facility];  Service: Endoscopy;  Laterality: N/A;  . Balloon dilation  10/19/2011    Procedure: BALLOON DILATION;  Surgeon: Rachael Fee, MD;  Location: [facility]COPY;  Service: Endoscopy;  Laterality: N/A;  . Esophagogastroduodenoscopy  11/07/2011    Procedure: ESOPHAGOGASTRODUODENOSCOPY (EGD);  Surgeon: Meryl Dare, MD,FACG;  Location: [facility] ENDOSCOPY;  Service: Endoscopy;  Laterality: N/A;  . Esophagogastroduodenoscopy  11/22/2011    Procedure: ESOPHAGOGASTRODUODENOSCOPY (EGD);  Surgeon: Louis Meckel, MD;  Location: [facility];  Service: Endoscopy;  Laterality: N/A;  . Esophagogastroduodenoscopy  11/24/2011    Procedure: ESOPHAGOGASTRODUODENOSCOPY (EGD);  Surgeon: Louis Meckel, MD;  Location: [facility];  Service: Endoscopy;  Laterality: N/A;  with removable duodenal stent (actually using esophageal partially covered 23X15 located in locked supply room  . Duodenal stent placement  11/24/2011    Procedure: DUODENAL STENT PLACEMENT;  Surgeon: Louis Meckel, MD;  Location: [facility] ENDOSCOPY;  Service: Endoscopy;  Laterality: N/A;  . Video bronchoscopy Bilateral 01/18/2013    Procedure: VIDEO BRONCHOSCOPY WITHOUT FLUORO;  Surgeon: Nyoka Cowden, MD;  Location:  [facility] ENDOSCOPY;  Service: Cardiopulmonary;  Laterality: Bilateral;  . Esophagogastroduodenoscopy N/A 02/11/2013    Procedure: ESOPHAGOGASTRODUODENOSCOPY (EGD);  Surgeon: Louis Meckel, MD;  Location: [facility];  Service: Endoscopy;  Laterality: N/A;  . Duodenal stent placement N/A 02/11/2013    Procedure: DUODENAL STENT PLACEMENT;  Surgeon: Louis Meckel, MD;  Location: [facility] ENDOSCOPY;  Service: Endoscopy;  Laterality: N/A;    Family History  Problem Relation Age of Onset  . Cervical cancer Mother   . Heart  disease Father     MI  . Coronary artery disease Brother   . Hypotension Sister   . Colon cancer Neg Hx   . Colon polyps Sister   . Pancreatic cancer      1/2 sister  . Coronary artery disease Sister     pacemaker  . Asthma Brother   . Asthma Sister     Allergies  Allergen Reactions  . Erythromycin Ethylsuccinate     Irregular pulse rate  . Prednisone     "makes me hyper"    Current Outpatient Prescriptions on File Prior to Visit  Medication Sig Dispense Refill  . albuterol (PROVENTIL HFA;VENTOLIN HFA) 108 (90 BASE) MCG/ACT inhaler Inhale 2 puffs into the lungs every 6 (six) hours as needed. For wheezing  1 Inhaler  11  . arformoterol (BROVANA) 15 MCG/2ML NEBU Take 2 mLs (15 mcg total) by nebulization 2 (two) times daily. Dx: 493.00  120 mL  12  . B Complex-C-Min-Fe-FA (HEMOCYTE PLUS) 106-1 MG CAPS Take 106 mg by mouth 2 (two) times daily.  180 each  3  . bisoprolol-hydrochlorothiazide (ZIAC) 2.5-6.25 MG per tablet Take 1 tablet by mouth daily.  90 tablet  3  . budesonide (PULMICORT) 0.25 MG/2ML nebulizer solution Take 2 mLs (0.25 mg total) by nebulization 2 (two) times daily. Dx: 493.00  120 mL  12  . Calcium-Vitamin D-Vitamin K (VIACTIV) 500-100-40 MG-UNT-MCG CHEW Chew by mouth 3 (three) times daily.        . Chlorphen-Phenyleph-APAP 2-5-325 MG TABS Take 1 tablet by mouth daily at 6 (six) AM. (AKA Coricidin)      . digoxin (LANOXIN) 0.125 MG tablet Take 1 tablet (0.125 mg total) by mouth daily.  90 tablet  3  . furosemide (LASIX) 20 MG tablet Take 1 tablet (20 mg total) by mouth once.  1 tablet  0  . lansoprazole (PREVACID SOLUTAB) 30 MG disintegrating tablet Take 2 tablets (60 mg total) by mouth 2 (two) times daily.  360 tablet  3  . levocetirizine (XYZAL) 5 MG tablet Take 5 mg by mouth every evening. Patient takes only as needed      . metoCLOPramide (REGLAN) 5 MG tablet Take 1 tablet (5 mg total) by mouth 2 (two) times daily.  180 tablet  3  . mometasone-formoterol (DULERA)  100-5 MCG/ACT AERO Take 2 puffs first thing in am and then another 2 puffs about 12 hours later.  1 Inhaler  11  . phenol (CHLORASEPTIC) 1.4 % LIQD Use as directed 2 sprays in the mouth or throat as needed.      . promethazine (PHENERGAN) 25 MG suppository Place 1 suppository (25 mg total) rectally every 6 (six) hours as needed.  36 each  3  . sucralfate (CARAFATE) 1 GM/10ML suspension Take 10 mLs (1 g total) by mouth 2 (two) times daily.  1800 mL  3  . traMADol (ULTRAM) 50 MG tablet Take 1 tablet (50 mg total) by mouth every 8 (eight) hours as needed  for pain.  30 tablet  5  . Vitamin D, Ergocalciferol, (DRISDOL) 50000 UNITS CAPS Take 1 capsule (50,000 Units total) by mouth every 7 (seven) days. On Friday  30 capsule  3  . zolpidem (AMBIEN) 5 MG tablet Take 1 tablet (5 mg total) by mouth at bedtime as needed for sleep.  90 tablet  1   No current facility-administered medications on file prior to visit.    BP 100/60  Pulse 75  Wt 117 lb (53.071 kg)  BMI 22.12 kg/m2  SpO2 97%chart     Objective:   Physical Exam  Constitutional: She is oriented to person, place, and time. She appears well-developed and well-nourished.  HENT:  Right Ear: External ear normal.  Left Ear: External ear normal.  Nose: Nose normal.  Mouth/Throat: Oropharynx is clear and moist.  Neck: Normal range of motion. Neck supple.  Cardiovascular: Normal rate, regular rhythm and normal heart sounds.   Pulmonary/Chest: Effort normal and breath sounds normal.  Abdominal: Soft. Bowel sounds are normal.  Musculoskeletal: Normal range of motion.  Neurological: She is alert and oriented to person, place, and time.  Skin: Skin is warm and dry.  Psychiatric: She has a normal mood and affect.          Assessment & Plan:  Assessment: 1. Stage II healing pressure ulcer 2. Hypertension 3. Atrial fibrillation  Plan: Bactroban to the affected area. It appears to already be healing pretty well not really requiring much  relief from pressure. Have tried to explain that to the patient and she is going to try to gain that area some rest. Encouraged and ABD pad at bedtime for support and to help relieve pressure.

## 2013-02-28 ENCOUNTER — Telehealth: Payer: Self-pay | Admitting: Internal Medicine

## 2013-02-28 NOTE — Telephone Encounter (Signed)
Pt was seen on 7-30 for pressure ulcer and requesting a jelly pillow from [facility].

## 2013-02-28 NOTE — Progress Notes (Signed)
Quick Note:  Called spoke with patient, advised of lab results / recs as stated by TP. Pt verbalized her understanding and denied any questions. ______ 

## 2013-03-01 ENCOUNTER — Ambulatory Visit: Payer: Medicare Other | Admitting: Family

## 2013-03-01 MED ORDER — UNABLE TO FIND: Status: DC

## 2013-03-01 NOTE — Telephone Encounter (Signed)
Rx sent to [facility] on [address] and pt aware

## 2013-03-01 NOTE — Telephone Encounter (Signed)
Fax order please 

## 2013-03-03 ENCOUNTER — Telehealth: Payer: Self-pay | Admitting: Internal Medicine

## 2013-03-03 ENCOUNTER — Encounter: Payer: Self-pay | Admitting: Internal Medicine

## 2013-03-03 LAB — AFB CULTURE WITH SMEAR (NOT AT ARMC): Acid Fast Smear: NONE SEEN

## 2013-03-04 ENCOUNTER — Encounter: Payer: Self-pay | Admitting: Internal Medicine

## 2013-03-04 ENCOUNTER — Ambulatory Visit (INDEPENDENT_AMBULATORY_CARE_PROVIDER_SITE_OTHER): Payer: Medicare Other | Admitting: Internal Medicine

## 2013-03-04 VITALS — BP 110/70 | HR 76 | Temp 98.1°F | Resp 18 | Ht 61.0 in | Wt 116.0 lb

## 2013-03-04 DIAGNOSIS — R600 Localized edema: Secondary | ICD-10-CM

## 2013-03-04 DIAGNOSIS — R609 Edema, unspecified: Secondary | ICD-10-CM

## 2013-03-04 LAB — CBC WITH DIFFERENTIAL/PLATELET
Basophils Relative: 0.4 % (ref 0.0–3.0)
Eosinophils Relative: 1.8 % (ref 0.0–5.0)
Hemoglobin: 12 g/dL (ref 12.0–15.0)
Lymphocytes Relative: 22.4 % (ref 12.0–46.0)
MCV: 81.6 fl (ref 78.0–100.0)
Neutro Abs: 5.5 10*3/uL (ref 1.4–7.7)
Neutrophils Relative %: 66.9 % (ref 43.0–77.0)
RBC: 4.55 Mil/uL (ref 3.87–5.11)
WBC: 8.2 10*3/uL (ref 4.5–10.5)

## 2013-03-04 LAB — ALBUMIN: Albumin: 2.4 g/dL — ABNORMAL LOW (ref 3.5–5.2)

## 2013-03-04 MED ORDER — ATENOLOL-CHLORTHALIDONE 50-25 MG PO TABS: 1.0000 | ORAL_TABLET | Freq: Every day | ORAL | Status: DC

## 2013-03-04 NOTE — Progress Notes (Signed)
  Subjective:    Patient ID: [name], female    DOB: 1928/09/15, 79 y.o.   MRN: [id number]  HPI Patient is an 79 year old female with a small bowel pull-through for esophagitis who has developed multiple esophageal strictures most recently she had upper endoscopy showing irritated tissue and stenosis she had a successful stenting process.  In the interim since she has developed this recurrent problem she has developed an iron deficiency anemia and now she has significant swelling of her lower extremities that may be multifactorial including protein calorie malnutrition and anemia.     Review of Systems   resting tremor or weakness edema status post recent EGD. Significant improvement in her cough and reduce risk of aspiration.  Weight stable Shortness of breath chest pain PND orthopnea significant lower extremity edema She does have some intermittent diarrhea No further hemoptysis   Objective:   Physical Exam  Constitutional: She is oriented to person, place, and time. No distress.  HENT:  Head: Normocephalic and atraumatic.  Eyes: Conjunctivae are normal. Pupils are equal, round, and reactive to light.  Neck: Normal range of motion. Neck supple.  Cardiovascular:  Murmur heard. Pulmonary/Chest: Breath sounds normal.  Abdominal: Bowel sounds are normal.  Musculoskeletal: She exhibits edema.  Neurological: She is alert and oriented to person, place, and time.  Resting tremor  Skin: She is not diaphoretic.          Assessment & Plan:  We will teach cabana wrapping technique today to compress her legs in preparation for compression stockings.  We will monitor a pre-albumin albumin CBC with differential today to look at the various etiologies for lower extremity edema  That are reversible   After washings showed moderate yeast and we'll start her on nystatin swish and swallow We will obtain appropriate lab work today Recent CBC appeared to be stable

## 2013-03-04 NOTE — Patient Instructions (Signed)
To change her blood pressure medicine to atenolol hydrochlorothiazide and you may pick this up at her local pharmacy for the next 90 days.  We will use Coban to wrap your legs for the next 2 weeks to compress some of the fluids  Before you pick up your compression stockings

## 2013-03-04 NOTE — Telephone Encounter (Signed)
[facility] Triage Call Report Triage Record Num: [id number] Operator: Donna Bernard Patient Name: [name] Call Date & Time: 03/03/2013 2:19:22PM Patient Phone: [phone] PCP: [name] Patient Gender: Female PCP Fax : [phone] Patient DOB: [birth year] [facility] Name[facility]  Reason for Call: Caller: Diamantina/Patient; PCP: [name] (Adults only); CB#: [phone]; Call regarding Swelling in legs and feet; onset 2 months ago with worsening edema in bilateral legs and feet 03/02/2013, up to knees, with pain 8 on 0-10 scale especially when walking; emergent sxs r/o per "Edema, Atraumatic" protocol except "New or worsening fatigue, weakness, confusion, or change in function." positive with disposition of " See Provider within 24 hours"; scheduled appointment for 03/04/2013 at 08:15 with Dr. Lovell Sheehan, home care advice given.  Protocol(s) Used: Edema, Atraumatic  Recommended Outcome per Protocol: See Provider within 24 hours  Reason for Outcome: New or worsening fatigue, weakness, confusion, or change in function.

## 2013-03-06 ENCOUNTER — Other Ambulatory Visit: Payer: Self-pay

## 2013-03-14 ENCOUNTER — Telehealth: Payer: Self-pay | Admitting: Gastroenterology

## 2013-03-14 MED ORDER — LANSOPRAZOLE 30 MG PO TBDP: 60.0000 mg | ORAL_TABLET | Freq: Two times a day (BID) | ORAL | Status: DC

## 2013-03-14 NOTE — Telephone Encounter (Signed)
Med sent to pharmacy.

## 2013-03-18 ENCOUNTER — Ambulatory Visit (INDEPENDENT_AMBULATORY_CARE_PROVIDER_SITE_OTHER): Payer: Medicare Other | Admitting: General Practice

## 2013-03-20 ENCOUNTER — Telehealth: Payer: Self-pay | Admitting: Gastroenterology

## 2013-03-21 NOTE — Telephone Encounter (Signed)
Medication approved 02/28/2013 till 09/17/2013  Informed pt

## 2013-03-22 ENCOUNTER — Ambulatory Visit (INDEPENDENT_AMBULATORY_CARE_PROVIDER_SITE_OTHER): Payer: Medicare Other | Admitting: Internal Medicine

## 2013-03-22 ENCOUNTER — Encounter: Payer: Self-pay | Admitting: Internal Medicine

## 2013-03-22 VITALS — BP 106/70 | HR 72 | Temp 98.6°F | Resp 16 | Ht 61.0 in | Wt 117.0 lb

## 2013-03-22 DIAGNOSIS — R609 Edema, unspecified: Secondary | ICD-10-CM

## 2013-03-22 DIAGNOSIS — E46 Unspecified protein-calorie malnutrition: Secondary | ICD-10-CM

## 2013-03-22 DIAGNOSIS — K219 Gastro-esophageal reflux disease without esophagitis: Secondary | ICD-10-CM

## 2013-03-22 MED ORDER — HEMOCYTE PLUS 106-1 MG PO CAPS: 106.0000 mg | ORAL_CAPSULE | Freq: Two times a day (BID) | ORAL | Status: DC

## 2013-03-22 MED ORDER — ZOLPIDEM TARTRATE 5 MG PO TABS: 5.0000 mg | ORAL_TABLET | Freq: Every evening | ORAL | Status: DC | PRN

## 2013-03-22 NOTE — Patient Instructions (Addendum)
The patient is instructed to continue all medications as prescribed. Schedule followup with check out clerk upon leaving the clinic  Naked boosted fruit smoothies  High protein

## 2013-03-22 NOTE — Progress Notes (Signed)
  Subjective:    Patient ID: [name], female    DOB: November 06, 1928, 79 y.o.   MRN: [id number]  HPI Significant reduction in edema of her lower extremity.  She underwent compression therapy with home health care to achieve significant reduction in her lower extremity edema now she's wearing compression stockings Underlying etiology was gastric outlet obstruction and malnutrition as well as atrial fibrillation on anticoagulation   Review of Systems  Constitutional: Positive for fatigue.  HENT: Positive for hearing loss and congestion.   Respiratory: Positive for shortness of breath.   Cardiovascular: Positive for leg swelling.  Genitourinary: Positive for frequency.  Neurological: Negative.        Objective:   Physical Exam  Constitutional: She is oriented to person, place, and time. She appears well-developed and well-nourished. No distress.  HENT:  Head: Normocephalic and atraumatic.  Eyes: Conjunctivae and EOM are normal. Pupils are equal, round, and reactive to light.  Neck: Normal range of motion. Neck supple. No JVD present. No tracheal deviation present. No thyromegaly present.  Cardiovascular: Intact distal pulses.   Murmur heard. Irregular rate and rhythm  Pulmonary/Chest: Effort normal and breath sounds normal. She has no wheezes. She exhibits no tenderness.  Abdominal: Soft. Bowel sounds are normal.  Musculoskeletal: She exhibits edema. She exhibits no tenderness.  Lymphadenopathy:    She has no cervical adenopathy.  Neurological: She is alert and oriented to person, place, and time. She has normal reflexes. No cranial nerve deficit.  Skin: She is not diaphoretic.  Psychiatric: She has a normal mood and affect. Her behavior is normal.          Assessment & Plan:  Stable blood pressure stable atrial fibrillation significantly improved lymphedema of her lower extremities No evidence for aspiration No evidence of failure  Protein calorie medication well  her pre-albumin Stable anemia

## 2013-04-18 ENCOUNTER — Ambulatory Visit (INDEPENDENT_AMBULATORY_CARE_PROVIDER_SITE_OTHER): Payer: Medicare Other | Admitting: Family

## 2013-04-18 ENCOUNTER — Encounter: Payer: Self-pay | Admitting: Family

## 2013-04-18 ENCOUNTER — Ambulatory Visit (INDEPENDENT_AMBULATORY_CARE_PROVIDER_SITE_OTHER): Payer: Medicare Other | Admitting: General Practice

## 2013-04-18 VITALS — BP 100/62 | HR 84 | Wt 122.0 lb

## 2013-04-18 DIAGNOSIS — I4891 Unspecified atrial fibrillation: Secondary | ICD-10-CM

## 2013-04-18 DIAGNOSIS — R0602 Shortness of breath: Secondary | ICD-10-CM

## 2013-04-18 DIAGNOSIS — R609 Edema, unspecified: Secondary | ICD-10-CM

## 2013-04-18 LAB — POCT INR: INR: 2

## 2013-04-18 NOTE — Patient Instructions (Addendum)

## 2013-04-18 NOTE — Progress Notes (Signed)
Subjective:    Patient ID: [name], female    DOB: Jan 14, 1929, 79 y.o.   MRN: [id number]  HPI 79 year old white female, nonsmoker, patient of Dr. Lovell Sheehan is in for recheck of peripheral edema, protein calorie malnutrition, and atrial fibrillation. She has increased her intake of protein a drink in daily protein shake. Minimize his sodium. Report she also invested in new compression stockings with more compression. Continues to have swelling in her lower extremities. Report some days better than others. Report shortness of breath is worse when she walks approximately 100 feet to her mailbox. Denies any chest pain. Had an echocardiogram done last year it revealed an ejection fraction of 60%. She continues to take Lasix 20 mg once daily and tolerates it well. No kidney or liver dysfunction.   Review of Systems  Constitutional: Negative.   HENT: Negative.   Respiratory: Positive for shortness of breath. Negative for cough, choking and wheezing.   Cardiovascular: Positive for leg swelling. Negative for chest pain and palpitations.  Endocrine: Negative.   Musculoskeletal: Negative.   Skin: Negative.   Allergic/Immunologic: Negative.   Neurological: Negative.   Hematological: Negative.   Psychiatric/Behavioral: Negative.    Past Medical History  Diagnosis Date  . GERD (gastroesophageal reflux disease)   . Osteoporosis   . Postmenopausal HRT (hormone replacement therapy)   . Anemia   . Hypertension   . Allergy   . Asthma   . Shortness of breath   . PONV (postoperative nausea and vomiting)   . Pneumonia      Hx of pneumonia,65months ago  . Dysrhythmia     a fib  . Headache(784.0)   . Esophageal cancer     removed esophagus stomach pulled up    History   Social History  . Marital Status: Single    Spouse Name: N/A    Number of Children: 1  . Years of Education: N/A   Occupational History  . Retired    Social History Main Topics  . Smoking status: Never Smoker   .  Smokeless tobacco: Never Used  . Alcohol Use: No  . Drug Use: No  . Sexual Activity: Yes   Other Topics Concern  . Not on file   Social History Narrative   DAILY CAFFEINE    USE    Past Surgical History  Procedure Laterality Date  . Left oophorectomy    . Esophageal removal of cancer,gastric ppull-thru 1993    . Cholecystectomy    . Rt arm fx    . Esophagogastroduodenoscopy  10/19/2011    Procedure: ESOPHAGOGASTRODUODENOSCOPY (EGD);  Surgeon: Rachael Fee, MD;  Location: [facility];  Service: Endoscopy;  Laterality: N/A;  . Balloon dilation  10/19/2011    Procedure: BALLOON DILATION;  Surgeon: Rachael Fee, MD;  Location: [facility];  Service: Endoscopy;  Laterality: N/A;  . Esophagogastroduodenoscopy  11/07/2011    Procedure: ESOPHAGOGASTRODUODENOSCOPY (EGD);  Surgeon: Meryl Dare, MD,FACG;  Location: [facility];  Service: Endoscopy;  Laterality: N/A;  . Esophagogastroduodenoscopy  11/22/2011    Procedure: ESOPHAGOGASTRODUODENOSCOPY (EGD);  Surgeon: Louis Meckel, MD;  Location: [facility];  Service: Endoscopy;  Laterality: N/A;  . Esophagogastroduodenoscopy  11/24/2011    Procedure: ESOPHAGOGASTRODUODENOSCOPY (EGD);  Surgeon: Louis Meckel, MD;  Location: [facility];  Service: Endoscopy;  Laterality: N/A;  with removable duodenal stent (actually using esophageal partially covered 23X15 located in locked supply room  . Duodenal stent placement  11/24/2011    Procedure: DUODENAL STENT PLACEMENT;  Surgeon: Louis Meckel, MD;  Location: [facility];  Service: Endoscopy;  Laterality: N/A;  . Video bronchoscopy Bilateral 01/18/2013    Procedure: VIDEO BRONCHOSCOPY WITHOUT FLUORO;  Surgeon: Nyoka Cowden, MD;  Location: [facility];  Service: Cardiopulmonary;  Laterality: Bilateral;  . Esophagogastroduodenoscopy N/A 02/11/2013    Procedure: ESOPHAGOGASTRODUODENOSCOPY (EGD);  Surgeon: Louis Meckel, MD;  Location: [facility];  Service: Endoscopy;  Laterality: N/A;   . Duodenal stent placement N/A 02/11/2013    Procedure: DUODENAL STENT PLACEMENT;  Surgeon: Louis Meckel, MD;  Location: [facility];  Service: Endoscopy;  Laterality: N/A;    Family History  Problem Relation Age of Onset  . Cervical cancer Mother   . Heart disease Father     MI  . Coronary artery disease Brother   . Hypotension Sister   . Colon cancer Neg Hx   . Colon polyps Sister   . Pancreatic cancer      1/2 sister  . Coronary artery disease Sister     pacemaker  . Asthma Brother   . Asthma Sister     Allergies  Allergen Reactions  . Erythromycin Ethylsuccinate     Irregular pulse rate  . Prednisone     "makes me hyper"    Current Outpatient Prescriptions on File Prior to Visit  Medication Sig Dispense Refill  . albuterol (PROVENTIL HFA;VENTOLIN HFA) 108 (90 BASE) MCG/ACT inhaler Inhale 2 puffs into the lungs every 6 (six) hours as needed. For wheezing  1 Inhaler  11  . arformoterol (BROVANA) 15 MCG/2ML NEBU Take 2 mLs (15 mcg total) by nebulization 2 (two) times daily. Dx: 493.00  120 mL  12  . atenolol-chlorthalidone (TENORETIC 50) 50-25 MG per tablet Take 1 tablet by mouth daily.  30 tablet  3  . budesonide (PULMICORT) 0.25 MG/2ML nebulizer solution Take 2 mLs (0.25 mg total) by nebulization 2 (two) times daily. Dx: 493.00  120 mL  12  . Calcium-Vitamin D-Vitamin K (VIACTIV) 500-100-40 MG-UNT-MCG CHEW Chew by mouth 3 (three) times daily.        . digoxin (LANOXIN) 0.125 MG tablet Take 1 tablet (0.125 mg total) by mouth daily.  90 tablet  3  . Fe Fum-FA-B Cmp-C-Zn-Mg-Mn-Cu (HEMOCYTE PLUS) 106-1 MG CAPS Take 106 mg by mouth 2 (two) times daily.  180 each  3  . lansoprazole (PREVACID SOLUTAB) 30 MG disintegrating tablet Take 2 tablets (60 mg total) by mouth 2 (two) times daily.  360 tablet  3  . levocetirizine (XYZAL) 5 MG tablet Take 5 mg by mouth every evening. Patient takes only as needed      . metoCLOPramide (REGLAN) 5 MG tablet Take 1 tablet (5 mg total) by  mouth 2 (two) times daily.  180 tablet  3  . mometasone-formoterol (DULERA) 100-5 MCG/ACT AERO Take 2 puffs first thing in am and then another 2 puffs about 12 hours later.  1 Inhaler  11  . mupirocin ointment (BACTROBAN) 2 % Apply topically 3 (three) times daily.  22 g  0  . phenol (CHLORASEPTIC) 1.4 % LIQD Use as directed 2 sprays in the mouth or throat as needed.      . promethazine (PHENERGAN) 25 MG suppository Place 1 suppository (25 mg total) rectally every 6 (six) hours as needed.  36 each  3  . sucralfate (CARAFATE) 1 GM/10ML suspension Take 10 mLs (1 g total) by mouth 2 (two) times daily.  1800 mL  3  . traMADol (ULTRAM) 50 MG tablet  Take 1 tablet (50 mg total) by mouth every 8 (eight) hours as needed for pain.  30 tablet  5  . UNABLE TO FIND Jelly pillow  1 Package  0  . Vitamin D, Ergocalciferol, (DRISDOL) 50000 UNITS CAPS Take 1 capsule (50,000 Units total) by mouth every 7 (seven) days. On Friday  30 capsule  3  . zolpidem (AMBIEN) 5 MG tablet Take 1 tablet (5 mg total) by mouth at bedtime as needed for sleep.  90 tablet  1   No current facility-administered medications on file prior to visit.    BP 100/62  Pulse 84  Wt 122 lb (55.339 kg)  BMI 23.06 kg/m2chart    Objective:   Physical Exam  Constitutional: She is oriented to person, place, and time. She appears well-developed and well-nourished.  HENT:  Right Ear: External ear normal.  Left Ear: External ear normal.  Nose: Nose normal.  Mouth/Throat: Oropharynx is clear and moist.  Neck: Normal range of motion. Neck supple.  Cardiovascular: Normal rate, regular rhythm and normal heart sounds.   Pulmonary/Chest: Effort normal and breath sounds normal.  Abdominal: Soft. Bowel sounds are normal.  Musculoskeletal: She exhibits edema. She exhibits no tenderness.  1+ pitting edema noted bilaterally.   Neurological: She is alert and oriented to person, place, and time.  Skin: Skin is warm and dry.  Psychiatric: She has a  normal mood and affect.          Assessment & Plan:  Assessment: 1. Peripheral edema 2. Shortness of breath 3. Atrial fibrillation 4. Protein calorie malnutrition  Plan refer to cardiology for further management. Discussed possible echocardiogram and/or chest test. Continue current medications. Continue compression stockings, Lasix, low sodium diet. Coumadin as directed. All the office with any questions or concerns. Recheck with Dr. Lovell Sheehan in December and as needed sooner.

## 2013-04-19 ENCOUNTER — Ambulatory Visit: Payer: Medicare Other | Admitting: Family

## 2013-05-14 ENCOUNTER — Ambulatory Visit (INDEPENDENT_AMBULATORY_CARE_PROVIDER_SITE_OTHER): Payer: Medicare Other | Admitting: Cardiovascular Disease

## 2013-05-14 ENCOUNTER — Ambulatory Visit (HOSPITAL_COMMUNITY): Payer: Medicare Other | Attending: Cardiovascular Disease

## 2013-05-14 ENCOUNTER — Encounter: Payer: Self-pay | Admitting: Cardiovascular Disease

## 2013-05-14 VITALS — BP 110/62 | HR 75 | Ht 61.0 in | Wt 125.0 lb

## 2013-05-14 DIAGNOSIS — I359 Nonrheumatic aortic valve disorder, unspecified: Secondary | ICD-10-CM

## 2013-05-14 DIAGNOSIS — R609 Edema, unspecified: Secondary | ICD-10-CM

## 2013-05-14 DIAGNOSIS — I10 Essential (primary) hypertension: Secondary | ICD-10-CM

## 2013-05-14 DIAGNOSIS — I4891 Unspecified atrial fibrillation: Secondary | ICD-10-CM

## 2013-05-14 DIAGNOSIS — I872 Venous insufficiency (chronic) (peripheral): Secondary | ICD-10-CM

## 2013-05-14 DIAGNOSIS — M79609 Pain in unspecified limb: Secondary | ICD-10-CM

## 2013-05-14 DIAGNOSIS — I351 Nonrheumatic aortic (valve) insufficiency: Secondary | ICD-10-CM | POA: Insufficient documentation

## 2013-05-14 DIAGNOSIS — M7989 Other specified soft tissue disorders: Secondary | ICD-10-CM | POA: Insufficient documentation

## 2013-05-14 NOTE — Assessment & Plan Note (Signed)
Well controlled.  Continue current medications and low sodium Dash type diet.    

## 2013-05-14 NOTE — Assessment & Plan Note (Signed)
Continue low dose diuretic Should f/u with her vein doctor.  Continue compression hose.  Will check venous duplex for insuf.  ABI's ot r/o arterial insuf. Although pulses ok  Suspect paresthesias are neuropathic.

## 2013-05-14 NOTE — Assessment & Plan Note (Signed)
Maint NSR  Coumadin stopped in June due to hemoptysis

## 2013-05-14 NOTE — Assessment & Plan Note (Signed)
Not really audible on exam Not clinically significant  Pulse pressure normal

## 2013-05-14 NOTE — Progress Notes (Signed)
Patient ID: [name], female   DOB: Dec 16, 1928, 79 y.o.   MRN: [id number] 79 yo referred for LE edema and leg pain.  She has known varicose veins.  Sees someone at vein clinic.  Has had stripping of right leg  Has done poorly since placement of another duodenal stent a few months ago.  Feels tingling in both LE;s  Has had more LE edema  Started on HCTZ with some improvement.  Has RUE tremor from chronic reglan Protein caloric malnutrition.  No previous heart diease.  No true claudication symptoms Some tingling in legs sounds more neuropathic.  Denies dyspnea chest pain or palpitations    Echo 4/13 Study Conclusions  - Left ventricle: The cavity size was normal. There was mild concentric hypertrophy. Systolic function was normal. The estimated ejection fraction was in the range of 55% to 60%. Wall motion was normal; there were no regional wall motion abnormalities. Features are consistent with a pseudonormal left ventricular filling pattern, with concomitant abnormal relaxation and increased filling pressure (grade 2 diastolic dysfunction). - Aortic valve: Moderate diffuse thickening and calcification. Moderate regurgitation. - Mitral valve: Mild regurgitation. - Pulmonary arteries: PA peak pressure: 36mm Hg (S).    ROS: Denies fever, malais, weight loss, blurry vision, decreased visual acuity, cough, sputum, SOB, hemoptysis, pleuritic pain, palpitaitons, heartburn, abdominal pain, melena, lower extremity edema, claudication, or rash.  All other systems reviewed and negative   General: Affect appropriate Elderly white female HEENT: normal Neck supple with no adenopathy JVP normal no bruits no thyromegaly Lungs clear with no wheezing and good diaphragmatic motion Heart:  S1/S2 SEM murmur,rub, gallop or click PMI normal Abdomen: benighn, BS positve, no tenderness, no AAA no bruit.  No HSM or HJR Distal pulses intact with no bruits Plus 1-2 bilateral  Edema with varicosities   Neuro RUE tremor  Skin warm and dry No muscular weakness  Medications Current Outpatient Prescriptions  Medication Sig Dispense Refill  . albuterol (PROVENTIL HFA;VENTOLIN HFA) 108 (90 BASE) MCG/ACT inhaler Inhale 2 puffs into the lungs every 6 (six) hours as needed. For wheezing  1 Inhaler  11  . arformoterol (BROVANA) 15 MCG/2ML NEBU Take 2 mLs (15 mcg total) by nebulization 2 (two) times daily. Dx: 493.00  120 mL  12  . atenolol-chlorthalidone (TENORETIC 50) 50-25 MG per tablet Take 1 tablet by mouth daily.  30 tablet  3  . bisoprolol-hydrochlorothiazide (ZIAC) 2.5-6.25 MG per tablet Take 1 tablet by mouth daily.       . budesonide (PULMICORT) 0.25 MG/2ML nebulizer solution Take 2 mLs (0.25 mg total) by nebulization 2 (two) times daily. Dx: 493.00  120 mL  12  . Calcium-Vitamin D-Vitamin K (VIACTIV) 500-100-40 MG-UNT-MCG CHEW Chew by mouth 3 (three) times daily.        . digoxin (LANOXIN) 0.125 MG tablet Take 1 tablet (0.125 mg total) by mouth daily.  90 tablet  3  . Fe Fum-FA-B Cmp-C-Zn-Mg-Mn-Cu (HEMOCYTE PLUS) 106-1 MG CAPS Take 106 mg by mouth 2 (two) times daily.  180 each  3  . FLUVIRIN INJ injection       . lansoprazole (PREVACID SOLUTAB) 30 MG disintegrating tablet Take 2 tablets (60 mg total) by mouth 2 (two) times daily.  360 tablet  3  . levocetirizine (XYZAL) 5 MG tablet Take 5 mg by mouth every evening. Patient takes only as needed      . metoCLOPramide (REGLAN) 5 MG tablet Take 1 tablet (5 mg total) by mouth 2 (two) times  daily.  180 tablet  3  . mometasone-formoterol (DULERA) 100-5 MCG/ACT AERO Take 2 puffs first thing in am and then another 2 puffs about 12 hours later.  1 Inhaler  11  . mupirocin ointment (BACTROBAN) 2 % Apply topically 3 (three) times daily.  22 g  0  . PATADAY 0.2 % SOLN       . phenol (CHLORASEPTIC) 1.4 % LIQD Use as directed 2 sprays in the mouth or throat as needed.      . promethazine (PHENERGAN) 25 MG suppository Place 1 suppository (25 mg total)  rectally every 6 (six) hours as needed.  36 each  3  . sucralfate (CARAFATE) 1 GM/10ML suspension Take 10 mLs (1 g total) by mouth 2 (two) times daily.  1800 mL  3  . traMADol (ULTRAM) 50 MG tablet Take 1 tablet (50 mg total) by mouth every 8 (eight) hours as needed for pain.  30 tablet  5  . UNABLE TO FIND Jelly pillow  1 Package  0  . Vitamin D, Ergocalciferol, (DRISDOL) 50000 UNITS CAPS Take 1 capsule (50,000 Units total) by mouth every 7 (seven) days. On Friday  30 capsule  3  . zolpidem (AMBIEN) 5 MG tablet Take 1 tablet (5 mg total) by mouth at bedtime as needed for sleep.  90 tablet  1   No current facility-administered medications for this visit.    Allergies Erythromycin ethylsuccinate and Prednisone  Family History: Family History  Problem Relation Age of Onset  . Cervical cancer Mother   . Heart disease Father     MI  . Coronary artery disease Brother   . Hypotension Sister   . Colon cancer Neg Hx   . Colon polyps Sister   . Pancreatic cancer      1/2 sister  . Coronary artery disease Sister     pacemaker  . Asthma Brother   . Asthma Sister     Social History: History   Social History  . Marital Status: Single    Spouse Name: N/A    Number of Children: 1  . Years of Education: N/A   Occupational History  . Retired    Social History Main Topics  . Smoking status: Never Smoker   . Smokeless tobacco: Never Used  . Alcohol Use: No  . Drug Use: No  . Sexual Activity: Yes   Other Topics Concern  . Not on file   Social History Narrative   DAILY CAFFEINE    USE    Electrocardiogram:  SR rate 75 poor R wave progression nonspecific ST/ [place] changes   Assessment and Plan

## 2013-05-14 NOTE — Patient Instructions (Addendum)
Your physician recommends that you schedule a follow-up appointment  As needed with Dr. Eden Emms  Your physician has requested that you have a lower or upper extremity venous duplex. This test is an ultrasound of the veins in the legs or arms. It looks at venous blood flow that carries blood from the heart to the legs or arms. Allow one hour for a Lower Venous exam. Allow thirty minutes for an Upper Venous exam. There are no restrictions or special instructions.  Your physician has requested that you have a lower or upper extremity arterial duplex. This test is an ultrasound of the arteries in the legs or arms. It looks at arterial blood flow in the legs and arms. Allow one hour for Lower and Upper Arterial scans. There are no restrictions or special instructions

## 2013-05-16 ENCOUNTER — Ambulatory Visit (INDEPENDENT_AMBULATORY_CARE_PROVIDER_SITE_OTHER): Payer: Medicare Other | Admitting: General Practice

## 2013-05-16 ENCOUNTER — Ambulatory Visit (HOSPITAL_COMMUNITY): Payer: Medicare Other | Attending: Cardiology

## 2013-05-16 DIAGNOSIS — I4891 Unspecified atrial fibrillation: Secondary | ICD-10-CM | POA: Insufficient documentation

## 2013-05-16 DIAGNOSIS — I10 Essential (primary) hypertension: Secondary | ICD-10-CM | POA: Insufficient documentation

## 2013-05-16 DIAGNOSIS — I2510 Atherosclerotic heart disease of native coronary artery without angina pectoris: Secondary | ICD-10-CM | POA: Insufficient documentation

## 2013-05-16 DIAGNOSIS — I739 Peripheral vascular disease, unspecified: Secondary | ICD-10-CM

## 2013-05-16 DIAGNOSIS — R209 Unspecified disturbances of skin sensation: Secondary | ICD-10-CM | POA: Insufficient documentation

## 2013-05-16 LAB — POCT INR: INR: 3.1

## 2013-05-29 ENCOUNTER — Encounter: Payer: Self-pay | Admitting: Gastroenterology

## 2013-05-29 ENCOUNTER — Telehealth: Payer: Self-pay | Admitting: Internal Medicine

## 2013-05-29 ENCOUNTER — Ambulatory Visit (INDEPENDENT_AMBULATORY_CARE_PROVIDER_SITE_OTHER): Payer: Medicare Other | Admitting: Gastroenterology

## 2013-05-29 VITALS — BP 100/60 | HR 64 | Ht 61.0 in | Wt 125.0 lb

## 2013-05-29 DIAGNOSIS — R251 Tremor, unspecified: Secondary | ICD-10-CM

## 2013-05-29 DIAGNOSIS — K219 Gastro-esophageal reflux disease without esophagitis: Secondary | ICD-10-CM

## 2013-05-29 DIAGNOSIS — R259 Unspecified abnormal involuntary movements: Secondary | ICD-10-CM

## 2013-05-29 NOTE — Assessment & Plan Note (Addendum)
Patient has clearly worsening pyrosis temporally related to the use of an inhaler.  Unfortunately, she is unaware of which inhaler that is.  Since her breathing is stable I have instructed her to contact pulmonary to see whether the inhaler can be discontinued.  Should this be a steroid inhaler then Candida esophagitis is a possibility although I do not see any evidence by oral pharyngeal exam.  She was instructed to use antacids as needed.

## 2013-05-29 NOTE — Telephone Encounter (Signed)
Fine with me to use q am only

## 2013-05-29 NOTE — Telephone Encounter (Signed)
Pt c/o increased reflux x 3 weeks. Pt states that GI Dr suggests that neb meds are causing increased reflux symptoms. Brovana and Pulmicort q12hr  (started 02/25/13 by TP) Currently Using BID/q12hr -- GI Dr recommending that neb tx be decreased to once daily Currently using Prevacid twice a day along with Carafate.   Please advise Dr Sherene Sires. Thanks.

## 2013-05-29 NOTE — Assessment & Plan Note (Signed)
She has a prominent resting tremor.  Plan to discontinue Reglan.

## 2013-05-29 NOTE — Telephone Encounter (Signed)
Pt aware of recs.  Nothing further needed. 

## 2013-05-29 NOTE — Progress Notes (Signed)
History of Present Illness:  Patient has returned because of complaints of severe pyrosis.  Since starting inhaler several months ago she's had severe pyrosis multiple times a day.  She denies vomiting.  With pyrosis she may develop mild nausea.  She denies dysphagia.  She's taking Prevacid twice a day along with Carafate.    Review of Systems: Pertinent positive and negative review of systems were noted in the above HPI section. All other review of systems were otherwise negative.    Current Medications, Allergies, Past Medical History, Past Surgical History, Family History and Social History were reviewed in [employer] electronic medical record  Vital signs were reviewed in today's medical record. Physical Exam: General: Well developed , well nourished, no acute distress Skin: anicteric Head: Normocephalic and atraumatic Eyes:  sclerae anicteric, EOMI Ears: Normal auditory acuity Mouth: No deformity or lesions Lungs: Clear throughout to auscultation Heart: Regular rate and rhythm; no murmurs, rubs or bruits Abdomen: Soft, non tender and non distended. No masses, hepatosplenomegaly or hernias noted. Normal Bowel sounds.  There is no succussion splash Rectal:deferred Musculoskeletal: Symmetrical with no gross deformities  Pulses:  Normal pulses noted Extremities: No clubbing, cyanosis, edema or deformities noted Neurological: Alert oriented x 4, grossly nonfocal; she has a resting tremor of her upper extremity Psychological:  Alert and cooperative. Normal mood and affect

## 2013-05-29 NOTE — Patient Instructions (Signed)
Discontinue Reglan Use Antiacids as needed for heartburn Contact Pulmonary about holding your inhaler

## 2013-06-13 ENCOUNTER — Ambulatory Visit (INDEPENDENT_AMBULATORY_CARE_PROVIDER_SITE_OTHER): Payer: Medicare Other | Admitting: General Practice

## 2013-06-13 ENCOUNTER — Encounter: Payer: Self-pay | Admitting: Internal Medicine

## 2013-06-13 DIAGNOSIS — I4891 Unspecified atrial fibrillation: Secondary | ICD-10-CM

## 2013-06-13 LAB — POCT INR: INR: 1.7

## 2013-06-13 NOTE — Progress Notes (Signed)
Pre-visit discussion using our clinic review tool. No additional management support is needed unless otherwise documented below in the visit note.  

## 2013-06-22 ENCOUNTER — Other Ambulatory Visit: Payer: Self-pay | Admitting: Internal Medicine

## 2013-07-08 ENCOUNTER — Ambulatory Visit: Payer: Medicare Other | Admitting: Internal Medicine

## 2013-07-08 ENCOUNTER — Ambulatory Visit (INDEPENDENT_AMBULATORY_CARE_PROVIDER_SITE_OTHER): Payer: Medicare Other | Admitting: General Practice

## 2013-07-08 ENCOUNTER — Ambulatory Visit (INDEPENDENT_AMBULATORY_CARE_PROVIDER_SITE_OTHER): Payer: Medicare Other | Admitting: Internal Medicine

## 2013-07-08 ENCOUNTER — Encounter: Payer: Self-pay | Admitting: Internal Medicine

## 2013-07-08 VITALS — BP 106/60 | HR 72 | Temp 98.2°F | Resp 16 | Ht 61.0 in | Wt 124.0 lb

## 2013-07-08 DIAGNOSIS — I4891 Unspecified atrial fibrillation: Secondary | ICD-10-CM

## 2013-07-08 DIAGNOSIS — K219 Gastro-esophageal reflux disease without esophagitis: Secondary | ICD-10-CM

## 2013-07-08 DIAGNOSIS — J690 Pneumonitis due to inhalation of food and vomit: Secondary | ICD-10-CM

## 2013-07-08 DIAGNOSIS — K5900 Constipation, unspecified: Secondary | ICD-10-CM

## 2013-07-08 LAB — BASIC METABOLIC PANEL
CO2: 34 mEq/L — ABNORMAL HIGH (ref 19–32)
GFR: 89.5 mL/min (ref >60.00)
Glucose, Bld: 117 mg/dL — ABNORMAL HIGH (ref 70–99)
Potassium: 3.6 mEq/L (ref 3.5–5.1)
Sodium: 139 mEq/L (ref 135–145)

## 2013-07-08 LAB — POCT INR
INR: 2.2
INR: 2.7

## 2013-07-08 LAB — CBC WITH DIFFERENTIAL/PLATELET
Basophils Absolute: 0 10*3/uL (ref 0.0–0.1)
Eosinophils Relative: 1.5 % (ref 0.0–5.0)
Lymphocytes Relative: 20.6 % (ref 12.0–46.0)
Lymphs Abs: 2.2 10*3/uL (ref 0.7–4.0)
Monocytes Absolute: 1 10*3/uL (ref 0.1–1.0)
Monocytes Relative: 9.3 % (ref 3.0–12.0)
Neutrophils Relative %: 68.5 % (ref 43.0–77.0)
Platelets: 361 10*3/uL (ref 150.0–400.0)
WBC: 10.4 10*3/uL (ref 4.5–10.5)

## 2013-07-08 MED ORDER — HYDROCOD POLST-CHLORPHEN POLST 10-8 MG/5ML PO LQCR: 5.0000 mL | Freq: Two times a day (BID) | ORAL | Status: DC | PRN

## 2013-07-08 MED ORDER — CEFDINIR 250 MG/5ML PO SUSR: 250.0000 mg | Freq: Two times a day (BID) | ORAL | Status: DC

## 2013-07-08 NOTE — Patient Instructions (Signed)
The patient is instructed to continue all medications as prescribed. Schedule followup with check out clerk upon leaving the clinic  

## 2013-07-08 NOTE — Progress Notes (Signed)
Subjective:    Patient ID: [name], female    DOB: Jul 31, 1929, 79 y.o.   MRN: [id number]  Gastrophageal Reflux She complains of abdominal pain and chest pain. Associated symptoms include fatigue.  Atrial Fibrillation Symptoms include chest pain and shortness of breath. Past medical history includes atrial fibrillation.   Has increased congestion and cough with bloody sputum Increased weakness Weak and pale Weight stable by drinking ensure plus History of small bowel pull through for esophageal CA Tried off the reglan for 10 days and possible aspirated  Review of Systems  Constitutional: Positive for activity change and fatigue.  HENT: Positive for postnasal drip and sinus pressure.   Eyes: Negative.   Respiratory: Positive for chest tightness and shortness of breath.   Cardiovascular: Positive for chest pain.  Gastrointestinal: Positive for abdominal pain and abdominal distention.  Endocrine: Negative.   Genitourinary: Negative.   Neurological: Negative.    Past Medical History  Diagnosis Date  . GERD (gastroesophageal reflux disease)   . Osteoporosis   . Postmenopausal HRT (hormone replacement therapy)   . Anemia   . Hypertension   . Allergy   . Asthma   . Shortness of breath   . PONV (postoperative nausea and vomiting)   . Pneumonia      Hx of pneumonia,52months ago  . Dysrhythmia     a fib  . Headache(784.0)   . Esophageal cancer     removed esophagus stomach pulled up    History   Social History  . Marital Status: Single    Spouse Name: N/A    Number of Children: 1  . Years of Education: N/A   Occupational History  . Retired    Social History Main Topics  . Smoking status: Never Smoker   . Smokeless tobacco: Never Used  . Alcohol Use: No  . Drug Use: No  . Sexual Activity: Yes   Other Topics Concern  . Not on file   Social History Narrative   DAILY CAFFEINE    USE    Past Surgical History  Procedure Laterality Date  . Left  oophorectomy    . Esophageal removal of cancer,gastric ppull-thru 1993    . Cholecystectomy    . Rt arm fx    . Esophagogastroduodenoscopy  10/19/2011    Procedure: ESOPHAGOGASTRODUODENOSCOPY (EGD);  Surgeon: Rachael Fee, MD;  Location: [facility];  Service: Endoscopy;  Laterality: N/A;  . Balloon dilation  10/19/2011    Procedure: BALLOON DILATION;  Surgeon: Rachael Fee, MD;  Location: [facility];  Service: Endoscopy;  Laterality: N/A;  . Esophagogastroduodenoscopy  11/07/2011    Procedure: ESOPHAGOGASTRODUODENOSCOPY (EGD);  Surgeon: Meryl Dare, MD,FACG;  Location: [facility] ENDOSCOPY;  Service: Endoscopy;  Laterality: N/A;  . Esophagogastroduodenoscopy  11/22/2011    Procedure: ESOPHAGOGASTRODUODENOSCOPY (EGD);  Surgeon: Louis Meckel, MD;  Location: [facility];  Service: Endoscopy;  Laterality: N/A;  . Esophagogastroduodenoscopy  11/24/2011    Procedure: ESOPHAGOGASTRODUODENOSCOPY (EGD);  Surgeon: Louis Meckel, MD;  Location: [facility];  Service: Endoscopy;  Laterality: N/A;  with removable duodenal stent (actually using esophageal partially covered 23X15 located in locked supply room  . Duodenal stent placement  11/24/2011    Procedure: DUODENAL STENT PLACEMENT;  Surgeon: Louis Meckel, MD;  Location: [facility];  Service: Endoscopy;  Laterality: N/A;  . Video bronchoscopy Bilateral 01/18/2013    Procedure: VIDEO BRONCHOSCOPY WITHOUT FLUORO;  Surgeon: Nyoka Cowden, MD;  Location: [facility] ENDOSCOPY;  Service: Cardiopulmonary;  Laterality:  Bilateral;  . Esophagogastroduodenoscopy N/A 02/11/2013    Procedure: ESOPHAGOGASTRODUODENOSCOPY (EGD);  Surgeon: Louis Meckel, MD;  Location: [facility];  Service: Endoscopy;  Laterality: N/A;  . Duodenal stent placement N/A 02/11/2013    Procedure: DUODENAL STENT PLACEMENT;  Surgeon: Louis Meckel, MD;  Location: [facility];  Service: Endoscopy;  Laterality: N/A;    Family History  Problem Relation Age of Onset  . Cervical cancer  Mother   . Heart disease Father     MI  . Coronary artery disease Brother   . Hypotension Sister   . Colon cancer Neg Hx   . Colon polyps Sister   . Pancreatic cancer      1/2 sister  . Coronary artery disease Sister     pacemaker  . Asthma Brother   . Asthma Sister     Allergies  Allergen Reactions  . Erythromycin Ethylsuccinate     Irregular pulse rate  . Prednisone     "makes me hyper"    Current Outpatient Prescriptions on File Prior to Visit  Medication Sig Dispense Refill  . albuterol (PROVENTIL HFA;VENTOLIN HFA) 108 (90 BASE) MCG/ACT inhaler Inhale 2 puffs into the lungs every 6 (six) hours as needed. For wheezing  1 Inhaler  11  . arformoterol (BROVANA) 15 MCG/2ML NEBU Take 2 mLs (15 mcg total) by nebulization 2 (two) times daily. Dx: 493.00  120 mL  12  . atenolol-chlorthalidone (TENORETIC) 50-25 MG per tablet TAKE 1 TABLET BY MOUTH EVERY DAY  30 tablet  0  . bisoprolol-hydrochlorothiazide (ZIAC) 2.5-6.25 MG per tablet Take 1 tablet by mouth daily.       . budesonide (PULMICORT) 0.25 MG/2ML nebulizer solution Take 2 mLs (0.25 mg total) by nebulization 2 (two) times daily. Dx: 493.00  120 mL  12  . Calcium-Vitamin D-Vitamin K (VIACTIV) 500-100-40 MG-UNT-MCG CHEW Chew by mouth 3 (three) times daily.        . digoxin (LANOXIN) 0.125 MG tablet Take 1 tablet (0.125 mg total) by mouth daily.  90 tablet  3  . Fe Fum-FA-B Cmp-C-Zn-Mg-Mn-Cu (HEMOCYTE PLUS) 106-1 MG CAPS Take 106 mg by mouth 2 (two) times daily.  180 each  3  . FLUVIRIN INJ injection       . lansoprazole (PREVACID SOLUTAB) 30 MG disintegrating tablet Take 2 tablets (60 mg total) by mouth 2 (two) times daily.  360 tablet  3  . levocetirizine (XYZAL) 5 MG tablet Take 5 mg by mouth every evening. Patient takes only as needed      . mometasone-formoterol (DULERA) 100-5 MCG/ACT AERO Take 2 puffs first thing in am and then another 2 puffs about 12 hours later.  1 Inhaler  11  . mupirocin ointment (BACTROBAN) 2 % Apply  topically 3 (three) times daily.  22 g  0  . PATADAY 0.2 % SOLN       . phenol (CHLORASEPTIC) 1.4 % LIQD Use as directed 2 sprays in the mouth or throat as needed.      . promethazine (PHENERGAN) 25 MG suppository Place 1 suppository (25 mg total) rectally every 6 (six) hours as needed.  36 each  3  . sucralfate (CARAFATE) 1 GM/10ML suspension Take 10 mLs (1 g total) by mouth 2 (two) times daily.  1800 mL  3  . traMADol (ULTRAM) 50 MG tablet Take 1 tablet (50 mg total) by mouth every 8 (eight) hours as needed for pain.  30 tablet  5  . UNABLE TO FIND Jelly pillow  1 Package  0  . Vitamin D, Ergocalciferol, (DRISDOL) 50000 UNITS CAPS Take 1 capsule (50,000 Units total) by mouth every 7 (seven) days. On Friday  30 capsule  3  . warfarin (COUMADIN) 5 MG tablet       . zolpidem (AMBIEN) 5 MG tablet Take 1 tablet (5 mg total) by mouth at bedtime as needed for sleep.  90 tablet  1   No current facility-administered medications on file prior to visit.    BP 106/60  Pulse 72  Temp(Src) 98.2 F (36.8 C)  Resp 16  Ht 5\' 1"  (1.549 m)  Wt 124 lb (56.246 kg)  BMI 23.44 kg/m2       Objective:   Physical Exam  Constitutional: She is oriented to person, place, and time. She appears well-nourished.  HENT:  Head: Normocephalic and atraumatic.  Eyes: Conjunctivae are normal. Pupils are equal, round, and reactive to light.  Neck: Neck supple.  Cardiovascular: Regular rhythm.   Murmur heard. Pulmonary/Chest: She is in respiratory distress. She has wheezes. She has rales. She exhibits tenderness.  Left lower lobe congestion and rails  Musculoskeletal: Normal range of motion.  Neurological: She is alert and oriented to person, place, and time.  Skin: Skin is dry.  Psychiatric: She has a normal mood and affect. Her behavior is normal.          Assessment & Plan:    Probable aspiration pneumonitis.  We'll treat with third generation cephalosporin Omnicef 300 mg by mouth twice a day for 10  days and Tussionex for cough as needed for 10 days every 12 hours.  Recommend continuation of the Reglan as another attempt at stopping the Reglan resulted in aspiration.  She understands the risks for Reglan and believes that the benefits in her case out weigh the risk.

## 2013-07-08 NOTE — Progress Notes (Signed)
Pre-visit discussion using our clinic review tool. No additional management support is needed unless otherwise documented below in the visit note.  

## 2013-07-08 NOTE — Progress Notes (Signed)
Pre visit review using our clinic review tool, if applicable. No additional management support is needed unless otherwise documented below in the visit note. 

## 2013-07-11 ENCOUNTER — Telehealth: Payer: Self-pay | Admitting: Internal Medicine

## 2013-07-11 NOTE — Telephone Encounter (Signed)
Patient Information:  Caller Name: Darel Hong; Also spoke with patient to complete triage assessment.  Phone: [phone]  Patient: [name], [name]  Gender: Female  DOB: 05-Mar-1929  Age: 79 Years  PCP: [name] (Adults only)  Office Follow Up:  Does the office need to follow up with this patient?: No  Instructions For The Office: N/A  RN Note:  Patient reports she feels better, but is having coughing spells at times. She has continued taking the prescription cough medicine and that did help her this morning.  Symptoms  Reason For Call & Symptoms: Coughing spells, low grade fever. Nasal congestion at times. Chest pain with coughing.  Reviewed Health History In EMR: Yes  Reviewed Medications In EMR: Yes  Reviewed Allergies In EMR: Yes  Reviewed Surgeries / Procedures: Yes  Date of Onset of Symptoms: 07/08/2013  Treatments Tried: Prescription cough medicine, antibiotics, nebulizer as prescribed  Treatments Tried Worked: No  Guideline(s) Used:  Colds  Disposition Per Guideline:   Home Care  Reason For Disposition Reached:   Colds with no complications  Advice Given:  Humidifier:  If the air in your home is dry, use a cool-mist humidifier  Call Back If:  Difficulty breathing occurs  Fever lasts more than 3 days  Cough lasts more than 3 weeks  You become worse  Patient Will Follow Care Advice:  YES

## 2013-07-12 ENCOUNTER — Other Ambulatory Visit: Payer: Self-pay | Admitting: *Deleted

## 2013-07-12 ENCOUNTER — Telehealth: Payer: Self-pay | Admitting: Internal Medicine

## 2013-07-12 MED ORDER — HYDROCOD POLST-CHLORPHEN POLST 10-8 MG/5ML PO LQCR: 5.0000 mL | Freq: Two times a day (BID) | ORAL | Status: DC | PRN

## 2013-07-12 NOTE — Telephone Encounter (Signed)
States she is better and request refill on cough med

## 2013-07-12 NOTE — Telephone Encounter (Signed)
Caller: Darel Hong Facility: not collected Patient: [name], [name] DOB: 1929-07-12 Phone: [phone]  Reason for Call: Patient's niece would like for [facility] in office to call the patient and schedule an appointment if needed. Triage was done yesterday, but patient denied appointment. Please contact her regarding this request at [phone].

## 2013-07-22 ENCOUNTER — Other Ambulatory Visit: Payer: Self-pay | Admitting: *Deleted

## 2013-07-22 ENCOUNTER — Other Ambulatory Visit: Payer: Self-pay | Admitting: Internal Medicine

## 2013-07-22 MED ORDER — ATENOLOL-CHLORTHALIDONE 50-25 MG PO TABS: ORAL_TABLET | ORAL | Status: DC

## 2013-07-26 ENCOUNTER — Encounter: Payer: Self-pay | Admitting: Family Medicine

## 2013-07-26 ENCOUNTER — Ambulatory Visit (INDEPENDENT_AMBULATORY_CARE_PROVIDER_SITE_OTHER): Payer: Medicare Other | Admitting: Family Medicine

## 2013-07-26 ENCOUNTER — Telehealth: Payer: Self-pay | Admitting: Internal Medicine

## 2013-07-26 VITALS — BP 104/76 | HR 86 | Temp 97.8°F | Wt 115.5 lb

## 2013-07-26 DIAGNOSIS — J690 Pneumonitis due to inhalation of food and vomit: Secondary | ICD-10-CM | POA: Insufficient documentation

## 2013-07-26 MED ORDER — MOXIFLOXACIN HCL 400 MG PO TABS: 400.0000 mg | ORAL_TABLET | Freq: Every day | ORAL | Status: DC

## 2013-07-26 MED ORDER — HYDROCOD POLST-CHLORPHEN POLST 10-8 MG/5ML PO LQCR: 5.0000 mL | Freq: Two times a day (BID) | ORAL | Status: DC | PRN

## 2013-07-26 NOTE — Telephone Encounter (Signed)
Patient Information:  Caller Name: Rashea  Phone: [phone]  Patient: [name], [name]  Gender: Female  DOB: 1929-03-21  Age: 79 Years  PCP: [name] (Adults only)  Office Follow Up:  Does the office need to follow up with this patient?: No  Instructions For The Office: N/A   Symptoms  Reason For Call & Symptoms: Pt is calling and states that she has a cough and congestion; sx started 07/12/13; was seen in the office and started on antibiotic on 07/12/13;  sx have not improved; coughing up blood now which started 07/21/13; pt  on blood thinner  Reviewed Health History In EMR: Yes  Reviewed Medications In EMR: Yes  Reviewed Allergies In EMR: Yes  Reviewed Surgeries / Procedures: Yes  Date of Onset of Symptoms: 07/21/2013  Guideline(s) Used:  Cough  Disposition Per Guideline:   Go to Office Now  Reason For Disposition Reached:   Coughed up > 1 tablespoon (15 ml) blood (Exception: blood-tinged sputum)  Advice Given:  Call Back If:  You become worse.  Patient Will Follow Care Advice:  YES  Appt made for 09:45am today with Dr Para March at [facility] due to no other appt available in [place] office.  Encouraged pt to have family or friend drive but she states that there is no one available to drive and she is okay driving.

## 2013-07-26 NOTE — Patient Instructions (Signed)
Stop the coumadin for now.   Use the cough medicine as needed and the inhalers as you would normally.  Start the antibiotics today- I sent it to the pharmacy.  If you get more short of breath, then dial 911 or go to the ER.  Call Monday for a follow up appointment with your regular doctor.  Take care.

## 2013-07-26 NOTE — Assessment & Plan Note (Signed)
Presumed, stop coumadin for now with streaky hemoptysis and abx use. Will defer to PCP about restarting coumadin.  If worsening SOB, to ER.  Okay for outpatient f/u currently.  D/w pt and nephew at clinic today.  She walked in clinic w/o getting SOB.  She'll call about f/u on Monday.   She agrees with plan .

## 2013-07-26 NOTE — Telephone Encounter (Signed)
Pt seen Dr Para March

## 2013-07-26 NOTE — Progress Notes (Signed)
Pre-visit discussion using our clinic review tool. No additional management support is needed unless otherwise documented below in the visit note.  She had probably ASP PNA in early 07/2013. Treated with abx and was improving until recently.  Sleep is disrupted.  Cough with sputum that was bloody occ, occ without blood in sputum. Prev with some relief from hycodan cough syrup.  She is getting slightly SOB recently.  She has some relief from nebs prev. Lives alone. Nephew brought her today.  She has a life alert button.  H/o chills recently and h/o stenting per GI prev.  No vomiting but gagging.  Occ ear pain and rhinorrhea.  On coumadin.  Taking 5mg  a day.    PMH and SH reviewed  ROS: See HPI, otherwise noncontributory.  Meds, vitals, and allergies reviewed.   nad ncat Tm wnl Nasal and OP exam wnl Neck supple no LA Not tachy, rrr Not in distress but diffuse rhonchi, rales, wheezes B but L>R abd soft Ext with 1+ BLE edema R hand tremor at baseline per patient.

## 2013-07-29 ENCOUNTER — Telehealth: Payer: Self-pay | Admitting: Internal Medicine

## 2013-07-29 NOTE — Telephone Encounter (Signed)
Call received from Dr. Lianne Bushy office requesting Dr. Lovell Sheehan to review office notes from 07/26/13 and advise pt of what to do next for follow-up.

## 2013-07-29 NOTE — Telephone Encounter (Signed)
Stay off of coumadin for now until she sees padonda on wednesday

## 2013-07-31 ENCOUNTER — Ambulatory Visit (INDEPENDENT_AMBULATORY_CARE_PROVIDER_SITE_OTHER): Payer: Medicare Other | Admitting: Family

## 2013-07-31 ENCOUNTER — Encounter: Payer: Self-pay | Admitting: Family

## 2013-07-31 ENCOUNTER — Ambulatory Visit (INDEPENDENT_AMBULATORY_CARE_PROVIDER_SITE_OTHER)
Admission: RE | Admit: 2013-07-31 | Discharge: 2013-07-31 | Disposition: A | Discharge status: Home / Self Care | Payer: Medicare Other | Source: Ambulatory Visit | Attending: Family | Admitting: Family

## 2013-07-31 VITALS — BP 100/60 | HR 75 | Wt 122.0 lb

## 2013-07-31 DIAGNOSIS — J690 Pneumonitis due to inhalation of food and vomit: Secondary | ICD-10-CM

## 2013-07-31 DIAGNOSIS — K219 Gastro-esophageal reflux disease without esophagitis: Secondary | ICD-10-CM

## 2013-07-31 MED ORDER — RANITIDINE HCL 75 MG PO TABS: 150.0000 mg | ORAL_TABLET | Freq: Two times a day (BID) | ORAL | Status: DC

## 2013-07-31 NOTE — Progress Notes (Signed)
Subjective:    Patient ID: [name], female    DOB: [birth year], 79 y.o.   MRN: [id number]  HPI 79 year old white female, nonsmoker, patient of Dr. Lovell Sheehan is in today as a followup of aspiration pneumonia. She was seen by Dr. Lovell Sheehan on 07/08/2013 for aspiration pneumonia likely related to GERD. She was then seen by Dr. Para March on 07/26/2013 with continued cough, blood and sputum, shortness of breath and was prescribed Avelox 400 mg once a day and Tussionex. Patient tolerated the medication well and took her last tablet today. Continues to complain of an acidic taste in her mouth, worse with lying. She has a history significant for esophageal reflux, esophageal stricture, gastric outlet obstruction and is under the care of GI. Her Coumadin was stopped because of complaints of blood in her sputum. Patient continues to report blood in her sputum. Has epigastric tenderness. No increase in burping or belching. She currently takes Prevacid 30 mg 2 tablets twice a day.   Review of Systems  Constitutional: Negative.   HENT: Negative.   Respiratory: Positive for cough and shortness of breath. Negative for wheezing.   Cardiovascular: Negative.   Gastrointestinal: Negative.   Endocrine: Negative.   Genitourinary: Negative.   Musculoskeletal: Negative.   Skin: Negative.   Neurological: Negative.   Psychiatric/Behavioral: Negative.    Past Medical History  Diagnosis Date  . GERD (gastroesophageal reflux disease)   . Osteoporosis   . Postmenopausal HRT (hormone replacement therapy)   . Anemia   . Hypertension   . Allergy   . Asthma   . Shortness of breath   . PONV (postoperative nausea and vomiting)   . Pneumonia      Hx of pneumonia,58months ago  . Dysrhythmia     a fib  . Headache(784.0)   . Esophageal cancer     removed esophagus stomach pulled up    History   Social History  . Marital Status: Single    Spouse Name: N/A    Number of Children: 1  . Years of Education:  N/A   Occupational History  . Retired    Social History Main Topics  . Smoking status: Never Smoker   . Smokeless tobacco: Never Used  . Alcohol Use: No  . Drug Use: No  . Sexual Activity: Yes   Other Topics Concern  . Not on file   Social History Narrative   DAILY CAFFEINE    USE    Past Surgical History  Procedure Laterality Date  . Left oophorectomy    . Esophageal removal of cancer,gastric ppull-thru 1993    . Cholecystectomy    . Rt arm fx    . Esophagogastroduodenoscopy  10/19/2011    Procedure: ESOPHAGOGASTRODUODENOSCOPY (EGD);  Surgeon: Rachael Fee, MD;  Location: [facility];  Service: Endoscopy;  Laterality: N/A;  . Balloon dilation  10/19/2011    Procedure: BALLOON DILATION;  Surgeon: Rachael Fee, MD;  Location: [facility];  Service: Endoscopy;  Laterality: N/A;  . Esophagogastroduodenoscopy  11/07/2011    Procedure: ESOPHAGOGASTRODUODENOSCOPY (EGD);  Surgeon: Meryl Dare, MD,FACG;  Location: [facility] ENDOSCOPY;  Service: Endoscopy;  Laterality: N/A;  . Esophagogastroduodenoscopy  11/22/2011    Procedure: ESOPHAGOGASTRODUODENOSCOPY (EGD);  Surgeon: Louis Meckel, MD;  Location: [facility];  Service: Endoscopy;  Laterality: N/A;  . Esophagogastroduodenoscopy  11/24/2011    Procedure: ESOPHAGOGASTRODUODENOSCOPY (EGD);  Surgeon: Louis Meckel, MD;  Location: [facility];  Service: Endoscopy;  Laterality: N/A;  with removable duodenal stent (actually  using esophageal partially covered 23X15 located in locked supply room  . Duodenal stent placement  11/24/2011    Procedure: DUODENAL STENT PLACEMENT;  Surgeon: Louis Meckel, MD;  Location: [facility];  Service: Endoscopy;  Laterality: N/A;  . Video bronchoscopy Bilateral 01/18/2013    Procedure: VIDEO BRONCHOSCOPY WITHOUT FLUORO;  Surgeon: Nyoka Cowden, MD;  Location: [facility]COPY;  Service: Cardiopulmonary;  Laterality: Bilateral;  . Esophagogastroduodenoscopy N/A 02/11/2013    Procedure:  ESOPHAGOGASTRODUODENOSCOPY (EGD);  Surgeon: Louis Meckel, MD;  Location: [facility];  Service: Endoscopy;  Laterality: N/A;  . Duodenal stent placement N/A 02/11/2013    Procedure: DUODENAL STENT PLACEMENT;  Surgeon: Louis Meckel, MD;  Location: [facility];  Service: Endoscopy;  Laterality: N/A;    Family History  Problem Relation Age of Onset  . Cervical cancer Mother   . Heart disease Father     MI  . Coronary artery disease Brother   . Hypotension Sister   . Colon cancer Neg Hx   . Colon polyps Sister   . Pancreatic cancer      1/2 sister  . Coronary artery disease Sister     pacemaker  . Asthma Brother   . Asthma Sister     Allergies  Allergen Reactions  . Erythromycin Ethylsuccinate     Irregular pulse rate  . Prednisone     "makes me hyper"    Current Outpatient Prescriptions on File Prior to Visit  Medication Sig Dispense Refill  . albuterol (PROVENTIL HFA;VENTOLIN HFA) 108 (90 BASE) MCG/ACT inhaler Inhale 2 puffs into the lungs every 6 (six) hours as needed. For wheezing  1 Inhaler  11  . arformoterol (BROVANA) 15 MCG/2ML NEBU Take 2 mLs (15 mcg total) by nebulization 2 (two) times daily. Dx: 493.00  120 mL  12  . atenolol-chlorthalidone (TENORETIC) 50-25 MG per tablet TAKE 1 TABLET BY MOUTH DAILY  90 tablet  3  . bisoprolol-hydrochlorothiazide (ZIAC) 2.5-6.25 MG per tablet Take 1 tablet by mouth daily.       . budesonide (PULMICORT) 0.25 MG/2ML nebulizer solution Take 2 mLs (0.25 mg total) by nebulization 2 (two) times daily. Dx: 493.00  120 mL  12  . Calcium-Vitamin D-Vitamin K (VIACTIV) 500-100-40 MG-UNT-MCG CHEW Chew by mouth 3 (three) times daily.        . chlorpheniramine-HYDROcodone (TUSSIONEX PENNKINETIC ER) 10-8 MG/5ML LQCR Take 5 mLs by mouth every 12 (twelve) hours as needed for cough (sedation caution).  120 mL  0  . digoxin (LANOXIN) 0.125 MG tablet Take 1 tablet (0.125 mg total) by mouth daily.  90 tablet  3  . Fe Fum-FA-B Cmp-C-Zn-Mg-Mn-Cu  (HEMOCYTE PLUS) 106-1 MG CAPS Take 106 mg by mouth 2 (two) times daily.  180 each  3  . FLUVIRIN INJ injection       . lansoprazole (PREVACID SOLUTAB) 30 MG disintegrating tablet Take 2 tablets (60 mg total) by mouth 2 (two) times daily.  360 tablet  3  . levocetirizine (XYZAL) 5 MG tablet Take 5 mg by mouth every evening. Patient takes only as needed      . metoCLOPramide (REGLAN) 5 MG tablet Take 5 mg by mouth 2 (two) times daily.      . mometasone-formoterol (DULERA) 100-5 MCG/ACT AERO Take 2 puffs first thing in am and then another 2 puffs about 12 hours later.  1 Inhaler  11  . moxifloxacin (AVELOX) 400 MG tablet Take 1 tablet (400 mg total) by mouth daily.  7 tablet  0  . mupirocin ointment (BACTROBAN) 2 % Apply topically 3 (three) times daily.  22 g  0  . PATADAY 0.2 % SOLN       . phenol (CHLORASEPTIC) 1.4 % LIQD Use as directed 2 sprays in the mouth or throat as needed.      . promethazine (PHENERGAN) 25 MG suppository Place 1 suppository (25 mg total) rectally every 6 (six) hours as needed.  36 each  3  . sucralfate (CARAFATE) 1 GM/10ML suspension Take 10 mLs (1 g total) by mouth 2 (two) times daily.  1800 mL  3  . traMADol (ULTRAM) 50 MG tablet Take 1 tablet (50 mg total) by mouth every 8 (eight) hours as needed for pain.  30 tablet  5  . UNABLE TO FIND Jelly pillow  1 Package  0  . Vitamin D, Ergocalciferol, (DRISDOL) 50000 UNITS CAPS Take 1 capsule (50,000 Units total) by mouth every 7 (seven) days. On Friday  30 capsule  3  . warfarin (COUMADIN) 5 MG tablet       . zolpidem (AMBIEN) 5 MG tablet Take 1 tablet (5 mg total) by mouth at bedtime as needed for sleep.  90 tablet  1   No current facility-administered medications on file prior to visit.    BP 100/60  Pulse 75  Wt 122 lb (55.339 kg)chart     Objective:   Physical Exam  Constitutional: She is oriented to person, place, and time. She appears well-developed and well-nourished.  HENT:  Right Ear: External ear normal.    Left Ear: External ear normal.  Nose: Nose normal.  Mouth/Throat: Oropharynx is clear and moist.  Neck: Normal range of motion. Neck supple.  Cardiovascular: Normal rate, regular rhythm and normal heart sounds.   Pulmonary/Chest: Effort normal and breath sounds normal. She has no wheezes.  Abdominal: Soft. Bowel sounds are normal.  Musculoskeletal: Normal range of motion.  Neurological: She is alert and oriented to person, place, and time.  Skin: Skin is warm and dry.  Psychiatric: She has a normal mood and affect.          Assessment & Plan:  Assessment: 1. Aspiration pneumonia likely related to uncontrolled GERD 2. GERD 3. Esophageal stricture 4. Gastric Outlet Obstruction  Plan: Add Zantac 75 mg twice a day to current medication regimen. Chest x-ray. Continue to hold Coumadin. Recheck on Friday. CXR today.

## 2013-07-31 NOTE — Patient Instructions (Signed)

## 2013-08-02 ENCOUNTER — Encounter: Payer: Self-pay | Admitting: Internal Medicine

## 2013-08-02 ENCOUNTER — Telehealth: Payer: Self-pay

## 2013-08-02 ENCOUNTER — Ambulatory Visit (INDEPENDENT_AMBULATORY_CARE_PROVIDER_SITE_OTHER): Payer: Medicare Other | Admitting: Internal Medicine

## 2013-08-02 VITALS — BP 100/60 | HR 65 | Temp 98.4°F | Wt 120.0 lb

## 2013-08-02 DIAGNOSIS — J441 Chronic obstructive pulmonary disease with (acute) exacerbation: Secondary | ICD-10-CM

## 2013-08-02 DIAGNOSIS — Z9889 Other specified postprocedural states: Secondary | ICD-10-CM

## 2013-08-02 DIAGNOSIS — Z9049 Acquired absence of other specified parts of digestive tract: Secondary | ICD-10-CM

## 2013-08-02 DIAGNOSIS — Z7901 Long term (current) use of anticoagulants: Secondary | ICD-10-CM | POA: Insufficient documentation

## 2013-08-02 DIAGNOSIS — J690 Pneumonitis due to inhalation of food and vomit: Secondary | ICD-10-CM

## 2013-08-02 HISTORY — DX: Other specified postprocedural states: Z90.49

## 2013-08-02 MED ORDER — HYDROCOD POLST-CHLORPHEN POLST 10-8 MG/5ML PO LQCR: 5.0000 mL | Freq: Two times a day (BID) | ORAL | Status: DC | PRN

## 2013-08-02 NOTE — Patient Instructions (Addendum)
Continue the nebulizer and  truy to take at night  To help with wheezing   Caution with the cough med and maybe only take at night . Keep you follow up.  Caution to avoid to breath in bathroom   Cleaners.

## 2013-08-02 NOTE — Telephone Encounter (Signed)
Pt was seen in office today and is feeling better.   I spoke to [place] to advise and she states that pt can resume coumadin. INR was 1.1 today. Per Abby Potash, have pt take 10mg  today and tomorrow then continue normal dosage. Have rechk in 1 week.  Pt aware and verbalized understanding

## 2013-08-02 NOTE — Progress Notes (Signed)
Pre visit review using our clinic review tool, if applicable. No additional management support is needed unless otherwise documented below in the visit note. Chief Complaint  Patient presents with  . Follow-up    PNA    HPI: Fu pna hx aspiration risk  pna suspected  Cough  In chest doiung  Much better rthan last visit .  No coughing up blood or bleeding  Last day of antibiotic   Chest pain some  As usual  Breathing  Ok on nebulizer    Makes some nausea.  Can drive self.  Feeling better.  Due for PT after weekend ? Can do today  Cough med helps a lot aware of sedation  Has little bit left .  ROS: See pertinent positives and negatives per HPI. Lives alone likes to clean own house kitchen and bathroom wears mask and vents area   Past Medical History  Diagnosis Date  . GERD (gastroesophageal reflux disease)   . Osteoporosis   . Postmenopausal HRT (hormone replacement therapy)   . Anemia   . Hypertension   . Allergy   . Asthma   . Shortness of breath   . PONV (postoperative nausea and vomiting)   . Pneumonia      Hx of pneumonia,82months ago  . Dysrhythmia     a fib  . Headache(784.0)   . Esophageal cancer     removed esophagus stomach pulled up    Family History  Problem Relation Age of Onset  . Cervical cancer Mother   . Heart disease Father     MI  . Coronary artery disease Brother   . Hypotension Sister   . Colon cancer Neg Hx   . Colon polyps Sister   . Pancreatic cancer      1/2 sister  . Coronary artery disease Sister     pacemaker  . Asthma Brother   . Asthma Sister     History   Social History  . Marital Status: Single    Spouse Name: N/A    Number of Children: 1  . Years of Education: N/A   Occupational History  . Retired    Social History Main Topics  . Smoking status: Never Smoker   . Smokeless tobacco: Never Used  . Alcohol Use: No  . Drug Use: No  . Sexual Activity: Yes   Other Topics Concern  . None   Social History Narrative   DAILY CAFFEINE    USE    Outpatient Encounter Prescriptions as of 08/02/2013  Medication Sig  . albuterol (PROVENTIL HFA;VENTOLIN HFA) 108 (90 BASE) MCG/ACT inhaler Inhale 2 puffs into the lungs every 6 (six) hours as needed. For wheezing  . arformoterol (BROVANA) 15 MCG/2ML NEBU Take 2 mLs (15 mcg total) by nebulization 2 (two) times daily. Dx: 493.00  . atenolol-chlorthalidone (TENORETIC) 50-25 MG per tablet TAKE 1 TABLET BY MOUTH DAILY  . bisoprolol-hydrochlorothiazide (ZIAC) 2.5-6.25 MG per tablet Take 1 tablet by mouth daily.   . budesonide (PULMICORT) 0.25 MG/2ML nebulizer solution Take 2 mLs (0.25 mg total) by nebulization 2 (two) times daily. Dx: 493.00  . Calcium-Vitamin D-Vitamin K (VIACTIV) 106-269-48 MG-UNT-MCG CHEW Chew by mouth 3 (three) times daily.    . chlorpheniramine-HYDROcodone (TUSSIONEX PENNKINETIC ER) 10-8 MG/5ML LQCR Take 5 mLs by mouth every 12 (twelve) hours as needed for cough (sedation caution).  Marland Kitchen digoxin (LANOXIN) 0.125 MG tablet Take 1 tablet (0.125 mg total) by mouth daily.  . Fe Fum-FA-B Cmp-C-Zn-Mg-Mn-Cu (HEMOCYTE PLUS) 106-1 MG  CAPS Take 106 mg by mouth 2 (two) times daily.  . lansoprazole (PREVACID SOLUTAB) 30 MG disintegrating tablet Take 2 tablets (60 mg total) by mouth 2 (two) times daily.  Marland Kitchen levocetirizine (XYZAL) 5 MG tablet Take 5 mg by mouth every evening. Patient takes only as needed  . metoCLOPramide (REGLAN) 5 MG tablet Take 5 mg by mouth 2 (two) times daily.  . mometasone-formoterol (DULERA) 100-5 MCG/ACT AERO Take 2 puffs first thing in am and then another 2 puffs about 12 hours later.  . mupirocin ointment (BACTROBAN) 2 % Apply topically 3 (three) times daily.  Marland Kitchen PATADAY 0.2 % SOLN   . phenol (CHLORASEPTIC) 1.4 % LIQD Use as directed 2 sprays in the mouth or throat as needed.  . promethazine (PHENERGAN) 25 MG suppository Place 1 suppository (25 mg total) rectally every 6 (six) hours as needed.  . ranitidine (ZANTAC) 75 MG tablet Take 2 tablets  (150 mg total) by mouth 2 (two) times daily.  . sucralfate (CARAFATE) 1 GM/10ML suspension Take 10 mLs (1 g total) by mouth 2 (two) times daily.  [facility] ophthalmic ointment   . traMADol (ULTRAM) 50 MG tablet Take 1 tablet (50 mg total) by mouth every 8 (eight) hours as needed for pain.  Marland Kitchen UNABLE TO FIND Jelly pillow  . Vitamin D, Ergocalciferol, (DRISDOL) 50000 UNITS CAPS Take 1 capsule (50,000 Units total) by mouth every 7 (seven) days. On Friday  . warfarin (COUMADIN) 5 MG tablet   . zolpidem (AMBIEN) 5 MG tablet Take 1 tablet (5 mg total) by mouth at bedtime as needed for sleep.  . [DISCONTINUED] chlorpheniramine-HYDROcodone (TUSSIONEX PENNKINETIC ER) 10-8 MG/5ML LQCR Take 5 mLs by mouth every 12 (twelve) hours as needed for cough (sedation caution).  . [DISCONTINUED] FLUVIRIN INJ injection   . [DISCONTINUED] moxifloxacin (AVELOX) 400 MG tablet Take 1 tablet (400 mg total) by mouth daily.    EXAM:  BP 100/60  Pulse 65  Temp(Src) 98.4 F (36.9 C) (Oral)  Wt 120 lb (54.432 kg)  SpO2 94%  Body mass index is 22.69 kg/(m^2).  GENERAL: vitals reviewed and listed above, alert, oriented, appears well hydrated and in no acute distress non toxic  Alert cognition intact looks well  HEENT: atraumatic, conjunctiva  clear, no obvious abnormalities on inspection of external nose and ears OP : no lesion edema or exudate   NECK: no obvious masses on inspection palpation  LUNGS: c no rakes  End exp wheeze  No rakes , good air movement CV: HRRR, no clubbing cyanosis or  peripheral edema nl cap refill  MS: moves all extremities without noticeable acute   abnormality PSYCH: pleasant and cooperative, no obvious depression or anxiety reviewed c xray   Wt Readings from Last 3 Encounters:  08/02/13 120 lb (54.432 kg)  07/31/13 122 lb (55.339 kg)  07/26/13 115 lb 8 oz (52.39 kg)    ASSESSMENT AND PLAN:  Discussed the following assessment and plan:  Aspiration pneumonia - reported hx of  hemoptysis resolved currently . ok to refil,l cough med ; use with exterme caution pt aware  Obstructive chronic bronchitis with exacerbation  Anticoagulant long-term use  Hx of esophagectomy  -Patient advised to return or notify health care team  if symptoms worsen or persist or new concerns arise.  Patient Instructions  Continue the nebulizer and  truy to take at night  To help with wheezing   Caution with the cough med and maybe only take at night . Keep you follow  up.  Caution to avoid to breath in bathroom   Cleaners.    Standley Brooking. Panosh M.D.  Total visit 78mins > 50% spent counseling and coordinating care

## 2013-08-05 ENCOUNTER — Ambulatory Visit: Payer: Medicare Other

## 2013-08-09 ENCOUNTER — Ambulatory Visit (INDEPENDENT_AMBULATORY_CARE_PROVIDER_SITE_OTHER): Payer: Medicare Other | Admitting: General Practice

## 2013-08-09 ENCOUNTER — Ambulatory Visit: Payer: Medicare Other | Admitting: Internal Medicine

## 2013-08-09 DIAGNOSIS — I4891 Unspecified atrial fibrillation: Secondary | ICD-10-CM

## 2013-08-09 LAB — POCT INR: INR: 2.4

## 2013-08-09 NOTE — Progress Notes (Signed)
Pre-visit discussion using our clinic review tool. No additional management support is needed unless otherwise documented below in the visit note.  

## 2013-08-23 ENCOUNTER — Encounter: Payer: Self-pay | Admitting: Internal Medicine

## 2013-08-29 ENCOUNTER — Telehealth: Payer: Self-pay | Admitting: Internal Medicine

## 2013-08-29 NOTE — Telephone Encounter (Signed)
Relevant patient education assigned to patient using Emmi. ° °

## 2013-09-06 ENCOUNTER — Ambulatory Visit (INDEPENDENT_AMBULATORY_CARE_PROVIDER_SITE_OTHER): Payer: Medicare Other | Admitting: General Practice

## 2013-09-06 ENCOUNTER — Telehealth: Payer: Self-pay | Admitting: Gastroenterology

## 2013-09-06 DIAGNOSIS — Z5181 Encounter for therapeutic drug level monitoring: Secondary | ICD-10-CM

## 2013-09-06 DIAGNOSIS — I4891 Unspecified atrial fibrillation: Secondary | ICD-10-CM

## 2013-09-06 LAB — POCT INR: INR: 3.9

## 2013-09-06 NOTE — Telephone Encounter (Signed)
Called patient to inform prior authorization has been done and her and her cost even with prior authorization is 500$ for a 3 month supply  She said its ok she will still get them because regular cost is 1700$ every 3 months

## 2013-09-06 NOTE — Progress Notes (Signed)
Pre-visit discussion using our clinic review tool. No additional management support is needed unless otherwise documented below in the visit note.  

## 2013-09-09 ENCOUNTER — Inpatient Hospital Stay (HOSPITAL_COMMUNITY)
Admission: EM | Admit: 2013-09-09 | Discharge: 2013-09-17 | DRG: 871 | Disposition: A | Discharge status: Skilled Nursing Facility | Payer: Medicare Other | Attending: Internal Medicine | Admitting: Internal Medicine

## 2013-09-09 ENCOUNTER — Emergency Department (HOSPITAL_COMMUNITY): Payer: Medicare Other

## 2013-09-09 ENCOUNTER — Telehealth: Payer: Self-pay | Admitting: Internal Medicine

## 2013-09-09 ENCOUNTER — Encounter (HOSPITAL_COMMUNITY): Payer: Self-pay | Admitting: Emergency Medicine

## 2013-09-09 ENCOUNTER — Telehealth: Payer: Self-pay | Admitting: Gastroenterology

## 2013-09-09 DIAGNOSIS — I272 Pulmonary hypertension, unspecified: Secondary | ICD-10-CM | POA: Diagnosis present

## 2013-09-09 DIAGNOSIS — I10 Essential (primary) hypertension: Secondary | ICD-10-CM | POA: Diagnosis present

## 2013-09-09 DIAGNOSIS — Z79899 Other long term (current) drug therapy: Secondary | ICD-10-CM

## 2013-09-09 DIAGNOSIS — Z8501 Personal history of malignant neoplasm of esophagus: Secondary | ICD-10-CM

## 2013-09-09 DIAGNOSIS — Z825 Family history of asthma and other chronic lower respiratory diseases: Secondary | ICD-10-CM

## 2013-09-09 DIAGNOSIS — R042 Hemoptysis: Secondary | ICD-10-CM

## 2013-09-09 DIAGNOSIS — K222 Esophageal obstruction: Secondary | ICD-10-CM

## 2013-09-09 DIAGNOSIS — K2970 Gastritis, unspecified, without bleeding: Secondary | ICD-10-CM | POA: Diagnosis present

## 2013-09-09 DIAGNOSIS — M810 Age-related osteoporosis without current pathological fracture: Secondary | ICD-10-CM | POA: Diagnosis present

## 2013-09-09 DIAGNOSIS — I2789 Other specified pulmonary heart diseases: Secondary | ICD-10-CM | POA: Diagnosis present

## 2013-09-09 DIAGNOSIS — Z800 Family history of malignant neoplasm of digestive organs: Secondary | ICD-10-CM

## 2013-09-09 DIAGNOSIS — E876 Hypokalemia: Secondary | ICD-10-CM | POA: Diagnosis present

## 2013-09-09 DIAGNOSIS — D72829 Elevated white blood cell count, unspecified: Secondary | ICD-10-CM | POA: Diagnosis present

## 2013-09-09 DIAGNOSIS — D509 Iron deficiency anemia, unspecified: Secondary | ICD-10-CM | POA: Diagnosis present

## 2013-09-09 DIAGNOSIS — J45909 Unspecified asthma, uncomplicated: Secondary | ICD-10-CM

## 2013-09-09 DIAGNOSIS — K219 Gastro-esophageal reflux disease without esophagitis: Secondary | ICD-10-CM | POA: Diagnosis present

## 2013-09-09 DIAGNOSIS — J189 Pneumonia, unspecified organism: Secondary | ICD-10-CM

## 2013-09-09 DIAGNOSIS — I5032 Chronic diastolic (congestive) heart failure: Secondary | ICD-10-CM | POA: Diagnosis present

## 2013-09-09 DIAGNOSIS — J449 Chronic obstructive pulmonary disease, unspecified: Secondary | ICD-10-CM | POA: Diagnosis present

## 2013-09-09 DIAGNOSIS — R197 Diarrhea, unspecified: Secondary | ICD-10-CM | POA: Diagnosis present

## 2013-09-09 DIAGNOSIS — R609 Edema, unspecified: Secondary | ICD-10-CM | POA: Diagnosis present

## 2013-09-09 DIAGNOSIS — K2990 Gastroduodenitis, unspecified, without bleeding: Secondary | ICD-10-CM

## 2013-09-09 DIAGNOSIS — I509 Heart failure, unspecified: Secondary | ICD-10-CM | POA: Diagnosis present

## 2013-09-09 DIAGNOSIS — Z888 Allergy status to other drugs, medicaments and biological substances status: Secondary | ICD-10-CM

## 2013-09-09 DIAGNOSIS — K920 Hematemesis: Secondary | ICD-10-CM

## 2013-09-09 DIAGNOSIS — J690 Pneumonitis due to inhalation of food and vomit: Secondary | ICD-10-CM

## 2013-09-09 DIAGNOSIS — I059 Rheumatic mitral valve disease, unspecified: Secondary | ICD-10-CM | POA: Diagnosis present

## 2013-09-09 DIAGNOSIS — I4891 Unspecified atrial fibrillation: Secondary | ICD-10-CM | POA: Diagnosis present

## 2013-09-09 DIAGNOSIS — J441 Chronic obstructive pulmonary disease with (acute) exacerbation: Secondary | ICD-10-CM

## 2013-09-09 DIAGNOSIS — A419 Sepsis, unspecified organism: Principal | ICD-10-CM | POA: Diagnosis present

## 2013-09-09 DIAGNOSIS — J4489 Other specified chronic obstructive pulmonary disease: Secondary | ICD-10-CM | POA: Diagnosis present

## 2013-09-09 DIAGNOSIS — K311 Adult hypertrophic pyloric stenosis: Secondary | ICD-10-CM | POA: Diagnosis present

## 2013-09-09 DIAGNOSIS — Z880 Allergy status to penicillin: Secondary | ICD-10-CM

## 2013-09-09 DIAGNOSIS — D62 Acute posthemorrhagic anemia: Secondary | ICD-10-CM | POA: Diagnosis present

## 2013-09-09 DIAGNOSIS — Z7901 Long term (current) use of anticoagulants: Secondary | ICD-10-CM

## 2013-09-09 HISTORY — DX: Pneumonia, unspecified organism: J18.9

## 2013-09-09 HISTORY — DX: Unspecified asthma, uncomplicated: J45.909

## 2013-09-09 LAB — URINALYSIS, ROUTINE W REFLEX MICROSCOPIC
Bilirubin Urine: NEGATIVE
Glucose, UA: NEGATIVE mg/dL
Ketones, ur: NEGATIVE mg/dL
Nitrite: NEGATIVE
PROTEIN: NEGATIVE mg/dL
Specific Gravity, Urine: 1.016 (ref 1.005–1.030)
Urobilinogen, UA: 1 mg/dL (ref 0.0–1.0)
pH: 6.5 (ref 5.0–8.0)

## 2013-09-09 LAB — CBC WITH DIFFERENTIAL/PLATELET
BASOS PCT: 0 % (ref 0–1)
Basophils Absolute: 0 10*3/uL (ref 0.0–0.1)
Eosinophils Absolute: 0.3 10*3/uL (ref 0.0–0.7)
Eosinophils Relative: 1 % (ref 0–5)
HCT: 37.3 % (ref 36.0–46.0)
Hemoglobin: 12.1 g/dL (ref 12.0–15.0)
Lymphocytes Relative: 3 % — ABNORMAL LOW (ref 12–46)
Lymphs Abs: 0.8 10*3/uL (ref 0.7–4.0)
MCH: 26 pg (ref 26.0–34.0)
MCHC: 32.4 g/dL (ref 30.0–36.0)
MCV: 80 fL (ref 78.0–100.0)
MONO ABS: 1.3 10*3/uL — AB (ref 0.1–1.0)
Monocytes Relative: 6 % (ref 3–12)
NEUTROS ABS: 19.5 10*3/uL — AB (ref 1.7–7.7)
Neutrophils Relative %: 90 % — ABNORMAL HIGH (ref 43–77)
Platelets: 328 10*3/uL (ref 150–400)
RBC: 4.66 MIL/uL (ref 3.87–5.11)
RDW: 15.8 % — AB (ref 11.5–15.5)
WBC: 21.8 10*3/uL — AB (ref 4.0–10.5)

## 2013-09-09 LAB — INFLUENZA PANEL BY PCR (TYPE A & B)
H1N1 flu by pcr: NOT DETECTED
Influenza A By PCR: NEGATIVE
Influenza B By PCR: NEGATIVE

## 2013-09-09 LAB — COMPREHENSIVE METABOLIC PANEL
ALBUMIN: 2.5 g/dL — AB (ref 3.5–5.2)
ALT: 11 U/L (ref 0–35)
AST: 15 U/L (ref 0–37)
Alkaline Phosphatase: 96 U/L (ref 39–117)
BILIRUBIN TOTAL: 0.8 mg/dL (ref 0.3–1.2)
BUN: 15 mg/dL (ref 6–23)
CHLORIDE: 95 meq/L — AB (ref 96–112)
CO2: 30 mEq/L (ref 19–32)
CREATININE: 0.71 mg/dL (ref 0.50–1.10)
Calcium: 8.1 mg/dL — ABNORMAL LOW (ref 8.4–10.5)
GFR calc Af Amer: >90 mL/min (ref >90)
GFR calc non Af Amer: 78 mL/min — ABNORMAL LOW (ref >90)
Glucose, Bld: 137 mg/dL — ABNORMAL HIGH (ref 70–99)
Potassium: 3.7 mEq/L (ref 3.7–5.3)
SODIUM: 135 meq/L — AB (ref 137–147)
Total Protein: 5.6 g/dL — ABNORMAL LOW (ref 6.0–8.3)

## 2013-09-09 LAB — PROTIME-INR
INR: 1.22 (ref 0.00–1.49)
Prothrombin Time: 15.1 seconds (ref 11.6–15.2)

## 2013-09-09 LAB — BASIC METABOLIC PANEL
BUN: 15 mg/dL (ref 6–23)
CO2: 28 mEq/L (ref 19–32)
Calcium: 7.5 mg/dL — ABNORMAL LOW (ref 8.4–10.5)
Chloride: 97 mEq/L (ref 96–112)
Creatinine, Ser: 0.74 mg/dL (ref 0.50–1.10)
GFR calc non Af Amer: 77 mL/min — ABNORMAL LOW (ref >90)
GFR, EST AFRICAN AMERICAN: 89 mL/min — AB (ref >90)
GLUCOSE: 124 mg/dL — AB (ref 70–99)
Potassium: 3.5 mEq/L — ABNORMAL LOW (ref 3.7–5.3)
Sodium: 135 mEq/L — ABNORMAL LOW (ref 137–147)

## 2013-09-09 LAB — URINE MICROSCOPIC-ADD ON

## 2013-09-09 LAB — CG4 I-STAT (LACTIC ACID): LACTIC ACID, VENOUS: 0.93 mmol/L (ref 0.5–2.2)

## 2013-09-09 LAB — EXPECTORATED SPUTUM ASSESSMENT W REFEX TO RESP CULTURE

## 2013-09-09 LAB — MRSA PCR SCREENING: MRSA by PCR: NEGATIVE

## 2013-09-09 LAB — EXPECTORATED SPUTUM ASSESSMENT W GRAM STAIN, RFLX TO RESP C

## 2013-09-09 MED ORDER — B COMPLEX-C PO TABS
1.0000 | ORAL_TABLET | Freq: Two times a day (BID) | ORAL | Status: DC
  Administered 2013-09-09 – 2013-09-17 (×16): 1 via ORAL
  Filled 2013-09-09 (×19): qty 1

## 2013-09-09 MED ORDER — LORATADINE 10 MG PO TABS
10.0000 mg | ORAL_TABLET | Freq: Every day | ORAL | Status: DC | PRN
  Filled 2013-09-09: qty 1

## 2013-09-09 MED ORDER — PHENOL 1.4 % MT LIQD: 2.0000 | OROMUCOSAL | Status: DC | PRN

## 2013-09-09 MED ORDER — ONDANSETRON HCL 4 MG PO TABS
4.0000 mg | ORAL_TABLET | Freq: Four times a day (QID) | ORAL | Status: DC | PRN
  Administered 2013-09-10 – 2013-09-17 (×4): 4 mg via ORAL
  Filled 2013-09-09 (×4): qty 1

## 2013-09-09 MED ORDER — ACETAMINOPHEN 500 MG PO TABS: 500.0000 mg | ORAL_TABLET | Freq: Every day | ORAL | Status: DC | PRN

## 2013-09-09 MED ORDER — ZOLPIDEM TARTRATE 5 MG PO TABS
5.0000 mg | ORAL_TABLET | Freq: Every evening | ORAL | Status: DC | PRN
  Administered 2013-09-10 – 2013-09-16 (×7): 5 mg via ORAL
  Filled 2013-09-09 (×8): qty 1

## 2013-09-09 MED ORDER — DOXYCYCLINE HYCLATE 100 MG PO TABS
100.0000 mg | ORAL_TABLET | Freq: Once | ORAL | Status: AC
Start: 2013-09-09 — End: 2013-09-09
  Administered 2013-09-09: 100 mg via ORAL
  Filled 2013-09-09: qty 1

## 2013-09-09 MED ORDER — ENOXAPARIN SODIUM 40 MG/0.4ML ~~LOC~~ SOLN
40.0000 mg | SUBCUTANEOUS | Status: DC
  Administered 2013-09-09: 40 mg via SUBCUTANEOUS
  Filled 2013-09-09 (×2): qty 0.4

## 2013-09-09 MED ORDER — DIGOXIN 125 MCG PO TABS
0.1250 mg | ORAL_TABLET | Freq: Every day | ORAL | Status: DC
  Administered 2013-09-10 – 2013-09-17 (×8): 0.125 mg via ORAL
  Filled 2013-09-09 (×8): qty 1

## 2013-09-09 MED ORDER — CEFTRIAXONE SODIUM 1 G IJ SOLR
1.0000 g | Freq: Once | INTRAMUSCULAR | Status: AC
  Administered 2013-09-09: 1 g via INTRAVENOUS
  Filled 2013-09-09: qty 10

## 2013-09-09 MED ORDER — SUCRALFATE 1 GM/10ML PO SUSP
1.0000 g | Freq: Two times a day (BID) | ORAL | Status: DC
  Administered 2013-09-09 – 2013-09-17 (×16): 1 g via ORAL
  Filled 2013-09-09 (×17): qty 10

## 2013-09-09 MED ORDER — HEMOCYTE PLUS 106-1 MG PO CAPS: 106.0000 mg | ORAL_CAPSULE | Freq: Two times a day (BID) | ORAL | Status: DC

## 2013-09-09 MED ORDER — BUDESONIDE 0.25 MG/2ML IN SUSP
0.2500 mg | Freq: Two times a day (BID) | RESPIRATORY_TRACT | Status: DC
  Administered 2013-09-09 – 2013-09-17 (×15): 0.25 mg via RESPIRATORY_TRACT
  Filled 2013-09-09 (×28): qty 2

## 2013-09-09 MED ORDER — SODIUM CHLORIDE 0.9 % IV SOLN: INTRAVENOUS | Status: DC

## 2013-09-09 MED ORDER — FAMOTIDINE 20 MG PO TABS: 20.0000 mg | ORAL_TABLET | Freq: Two times a day (BID) | ORAL | Status: DC

## 2013-09-09 MED ORDER — MOMETASONE FURO-FORMOTEROL FUM 100-5 MCG/ACT IN AERO: 2.0000 | INHALATION_SPRAY | Freq: Two times a day (BID) | RESPIRATORY_TRACT | Status: DC

## 2013-09-09 MED ORDER — LEVOCETIRIZINE DIHYDROCHLORIDE 5 MG PO TABS: 5.0000 mg | ORAL_TABLET | Freq: Every evening | ORAL | Status: DC

## 2013-09-09 MED ORDER — ONDANSETRON HCL 4 MG/2ML IJ SOLN
4.0000 mg | Freq: Four times a day (QID) | INTRAMUSCULAR | Status: DC | PRN
  Administered 2013-09-10: 4 mg via INTRAVENOUS
  Filled 2013-09-09: qty 2

## 2013-09-09 MED ORDER — DOXYCYCLINE HYCLATE 100 MG PO TABS
100.0000 mg | ORAL_TABLET | Freq: Two times a day (BID) | ORAL | Status: DC
  Administered 2013-09-10 – 2013-09-15 (×11): 100 mg via ORAL
  Filled 2013-09-09 (×13): qty 1

## 2013-09-09 MED ORDER — CEFTRIAXONE SODIUM 1 G IJ SOLR
1.0000 g | INTRAMUSCULAR | Status: AC
  Administered 2013-09-10 – 2013-09-14 (×5): 1 g via INTRAVENOUS
  Filled 2013-09-09 (×8): qty 10

## 2013-09-09 MED ORDER — SODIUM CHLORIDE 0.9 % IV BOLUS (SEPSIS)
1000.0000 mL | Freq: Once | INTRAVENOUS | Status: AC
  Administered 2013-09-09: 1000 mL via INTRAVENOUS

## 2013-09-09 MED ORDER — CALCIUM-VITAMIN D-VITAMIN K 500-100-40 MG-UNT-MCG PO CHEW: CHEWABLE_TABLET | Freq: Three times a day (TID) | ORAL | Status: DC

## 2013-09-09 MED ORDER — ARFORMOTEROL TARTRATE 15 MCG/2ML IN NEBU
15.0000 ug | INHALATION_SOLUTION | Freq: Two times a day (BID) | RESPIRATORY_TRACT | Status: DC
  Administered 2013-09-09 – 2013-09-17 (×15): 15 ug via RESPIRATORY_TRACT
  Filled 2013-09-09 (×20): qty 2

## 2013-09-09 MED ORDER — ACETAMINOPHEN 500 MG PO TABS
1000.0000 mg | ORAL_TABLET | Freq: Once | ORAL | Status: AC
  Administered 2013-09-09: 1000 mg via ORAL
  Filled 2013-09-09: qty 2

## 2013-09-09 MED ORDER — CALCIUM CARBONATE-VITAMIN D 500-200 MG-UNIT PO TABS
1.0000 | ORAL_TABLET | Freq: Three times a day (TID) | ORAL | Status: DC
  Administered 2013-09-09 – 2013-09-17 (×22): 1 via ORAL
  Filled 2013-09-09 (×25): qty 1

## 2013-09-09 MED ORDER — TRAMADOL HCL 50 MG PO TABS
50.0000 mg | ORAL_TABLET | Freq: Three times a day (TID) | ORAL | Status: DC | PRN
  Administered 2013-09-14 – 2013-09-17 (×3): 50 mg via ORAL
  Filled 2013-09-09 (×4): qty 1

## 2013-09-09 MED ORDER — MUPIROCIN 2 % EX OINT: TOPICAL_OINTMENT | Freq: Three times a day (TID) | CUTANEOUS | Status: DC

## 2013-09-09 MED ORDER — SODIUM CHLORIDE 0.9 % IV SOLN
INTRAVENOUS | Status: DC
  Administered 2013-09-09 – 2013-09-12 (×5): via INTRAVENOUS

## 2013-09-09 MED ORDER — ALBUTEROL SULFATE (2.5 MG/3ML) 0.083% IN NEBU: 2.5000 mg | INHALATION_SOLUTION | Freq: Four times a day (QID) | RESPIRATORY_TRACT | Status: DC | PRN

## 2013-09-09 MED ORDER — LANSOPRAZOLE 30 MG PO CPDR
60.0000 mg | DELAYED_RELEASE_CAPSULE | Freq: Two times a day (BID) | ORAL | Status: DC
  Administered 2013-09-10 – 2013-09-17 (×15): 60 mg via ORAL
  Filled 2013-09-09 (×17): qty 2

## 2013-09-09 MED ORDER — HYDROCOD POLST-CHLORPHEN POLST 10-8 MG/5ML PO LQCR
5.0000 mL | Freq: Two times a day (BID) | ORAL | Status: DC | PRN
  Administered 2013-09-10 – 2013-09-14 (×7): 5 mL via ORAL
  Filled 2013-09-09 (×7): qty 5

## 2013-09-09 MED ORDER — ACETAMINOPHEN 325 MG PO TABS
650.0000 mg | ORAL_TABLET | Freq: Four times a day (QID) | ORAL | Status: DC | PRN
  Administered 2013-09-13: 650 mg via ORAL
  Filled 2013-09-09: qty 2

## 2013-09-09 MED ORDER — METOPROLOL TARTRATE 1 MG/ML IV SOLN
2.5000 mg | Freq: Four times a day (QID) | INTRAVENOUS | Status: DC | PRN
  Administered 2013-09-12: 5 mg via INTRAVENOUS
  Filled 2013-09-09 (×2): qty 5

## 2013-09-09 MED ORDER — METOCLOPRAMIDE HCL 5 MG PO TABS
5.0000 mg | ORAL_TABLET | Freq: Two times a day (BID) | ORAL | Status: DC
  Administered 2013-09-09 – 2013-09-17 (×16): 5 mg via ORAL
  Filled 2013-09-09 (×17): qty 1

## 2013-09-09 MED ORDER — ACETAMINOPHEN 650 MG RE SUPP: 650.0000 mg | Freq: Four times a day (QID) | RECTAL | Status: DC | PRN

## 2013-09-09 NOTE — ED Notes (Signed)
Bed: WA14 Expected date:  Expected time:  Means of arrival:  Comments: EMS 

## 2013-09-09 NOTE — H&P (Addendum)
[facility] History and Physical  [name] [id number] DOB: Oct 10, 1928 DOA: 09/09/2013  Referring physician: EDP PCP: Georgetta Haber, MD   Chief Complaint:   HPI: [name] is a 80 y.o. female with multiple medical problems as listed below including her, gastric outlet obstruction status post stents in the past, atrial fibrillation on chronic Coumadin, hematemesis followed by Dr. Deatra Ina, history of aspiration who presents with above complaints. She states that she has had worsening of her cough in the past one week, along with subjective fevers up. She states that the cough is productive of dark yellowish sputum. Also patient states that she she had diarrhea in the past week-nonbloody, but that she's not had any in the past 2 days. She states that 4-5 days ago she was having some hematemesis again and so went to Dr. Kelby Fam office and was asked to hold off her Coumadin and she's taken it since then. She denies chest pain and no leg swelling. She states she's not had any further hematemesis or diarrhea today. She was seen in the ED EKG showed normal sinus rhythm at 93, chest x-ray showed new consolidation in the right mid lower lung zone. She was febrile to 103.3 in ED with white cell count of 21.8, lactic acid normal at 0.93. She started on empiric antibiotics with Rocephin and doxycycline and admitted for further evaluation and management.  Review of Systems The patient denies, vision loss, decreased hearing, hoarseness, chest pain, syncope,peripheral edema, balance deficits, hemoptysis, abdominal pain, , hematuria, incontinence, genital sores, muscle weakness, suspicious skin lesions, transient blindness, difficulty walking.   Past Medical History  Diagnosis Date  . GERD (gastroesophageal reflux disease)   . Osteoporosis   . Postmenopausal HRT (hormone replacement therapy)   . Anemia   . Hypertension   . Allergy   . Asthma   . Shortness of breath   . PONV  (postoperative nausea and vomiting)   . Pneumonia      Hx of pneumonia,65months ago  . Dysrhythmia     a fib  . Headache(784.0)   . Esophageal cancer     removed esophagus stomach pulled up   Past Surgical History  Procedure Laterality Date  . Left oophorectomy    . Esophageal removal of cancer,gastric ppull-thru 1993    . Cholecystectomy    . Rt arm fx    . Esophagogastroduodenoscopy  10/19/2011    Procedure: ESOPHAGOGASTRODUODENOSCOPY (EGD);  Surgeon: Milus Banister, MD;  Location: [facility];  Service: Endoscopy;  Laterality: N/A;  . Balloon dilation  10/19/2011    Procedure: BALLOON DILATION;  Surgeon: Milus Banister, MD;  Location: [facility] ENDOSCOPY;  Service: Endoscopy;  Laterality: N/A;  . Esophagogastroduodenoscopy  11/07/2011    Procedure: ESOPHAGOGASTRODUODENOSCOPY (EGD);  Surgeon: Ladene Artist, MD,FACG;  Location[facility];  Service: Endoscopy;  Laterality: N/A;  . Esophagogastroduodenoscopy  11/22/2011    Procedure: ESOPHAGOGASTRODUODENOSCOPY (EGD);  Surgeon: Inda Castle, MD;  Location: [facility];  Service: Endoscopy;  Laterality: N/A;  . Esophagogastroduodenoscopy  11/24/2011    Procedure: ESOPHAGOGASTRODUODENOSCOPY (EGD);  Surgeon: Inda Castle, MD;  Location: [facility];  Service: Endoscopy;  Laterality: N/A;  with removable duodenal stent (actually using esophageal partially covered 23X15 located in locked supply room  . Duodenal stent placement  11/24/2011    Procedure: DUODENAL STENT PLACEMENT;  Surgeon: Inda Castle, MD;  Location: [facility] ENDOSCOPY;  Service: Endoscopy;  Laterality: N/A;  . Video bronchoscopy Bilateral 01/18/2013    Procedure: VIDEO BRONCHOSCOPY  WITHOUT FLUORO;  Surgeon: Tanda Rockers, MD;  Location: [facility];  Service: Cardiopulmonary;  Laterality: Bilateral;  . Esophagogastroduodenoscopy N/A 02/11/2013    Procedure: ESOPHAGOGASTRODUODENOSCOPY (EGD);  Surgeon: Inda Castle, MD;  Location: [facility];  Service: Endoscopy;  Laterality:  N/A;  . Duodenal stent placement N/A 02/11/2013    Procedure: DUODENAL STENT PLACEMENT;  Surgeon: Inda Castle, MD;  Location: [facility] ENDOSCOPY;  Service: Endoscopy;  Laterality: N/A;   Social History:  reports that she has never smoked. She has never used smokeless tobacco. She reports that she does not drink alcohol or use illicit drugs.  Allergies  Allergen Reactions  . Erythromycin Ethylsuccinate     Irregular pulse rate  . Prednisone     "makes me hyper"    Family History  Problem Relation Age of Onset  . Cervical cancer Mother   . Heart disease Father     MI  . Coronary artery disease Brother   . Hypotension Sister   . Colon cancer Neg Hx   . Colon polyps Sister   . Pancreatic cancer      1/2 sister  . Coronary artery disease Sister     pacemaker  . Asthma Brother   . Asthma Sister      Prior to Admission medications   Medication Sig Start Date End Date Taking? Authorizing Provider  acetaminophen (TYLENOL) 500 MG tablet Take 500 mg by mouth daily as needed for headache.   Yes Historical Provider, MD  albuterol (PROVENTIL HFA;VENTOLIN HFA) 108 (90 BASE) MCG/ACT inhaler Inhale 2 puffs into the lungs every 6 (six) hours as needed. For wheezing 01/28/13  Yes Tanda Rockers, MD  arformoterol (BROVANA) 15 MCG/2ML NEBU Take 2 mLs (15 mcg total) by nebulization 2 (two) times daily. Dx: 493.00 02/25/13  Yes Tammy S Parrett, NP  atenolol-chlorthalidone (TENORETIC) 50-25 MG per tablet Take 1 tablet by mouth daily.   Yes Historical Provider, MD  budesonide (PULMICORT) 0.25 MG/2ML nebulizer solution Take 2 mLs (0.25 mg total) by nebulization 2 (two) times daily. Dx: 493.00 02/25/13  Yes Tammy S Parrett, NP  cefUROXime (CEFTIN) 250 MG tablet Take 250 mg by mouth 2 (two) times daily with a meal.   Yes Historical Provider, MD  chlorpheniramine-HYDROcodone (TUSSIONEX PENNKINETIC ER) 10-8 MG/5ML LQCR Take 5 mLs by mouth every 12 (twelve) hours as needed for cough (sedation caution). 08/02/13  Yes  Burnis Medin, MD  digoxin (LANOXIN) 0.125 MG tablet Take 1 tablet (0.125 mg total) by mouth daily. 12/21/12 12/21/13 Yes Ricard Dillon, MD  sucralfate (CARAFATE) 1 GM/10ML suspension Take 10 mLs (1 g total) by mouth 2 (two) times daily. 12/21/12  Yes Ricard Dillon, MD  traMADol (ULTRAM) 50 MG tablet Take 1 tablet (50 mg total) by mouth every 8 (eight) hours as needed for pain. 06/12/12  Yes Ricard Dillon, MD  Vitamin D, Ergocalciferol, (DRISDOL) 50000 UNITS CAPS Take 1 capsule (50,000 Units total) by mouth every 7 (seven) days. On Friday 09/12/12  Yes Ricard Dillon, MD  warfarin (COUMADIN) 5 MG tablet Take 5 mg by mouth daily.  04/23/13  Yes Historical Provider, MD  zolpidem (AMBIEN) 5 MG tablet Take 1 tablet (5 mg total) by mouth at bedtime as needed for sleep. 03/22/13 03/22/14 Yes Ricard Dillon, MD  bisoprolol-hydrochlorothiazide [facility]) 2.5-6.25 MG per tablet Take 1 tablet by mouth daily.  02/23/13   Historical Provider, MD  Calcium-Vitamin D-Vitamin K ([place]) 102-725-36 MG-UNT-MCG CHEW Chew by mouth 3 (three)  times daily.      Historical Provider, MD  Fe Fum-FA-B Cmp-C-Zn-Mg-Mn-Cu (HEMOCYTE PLUS) 106-1 MG CAPS Take 106 mg by mouth 2 (two) times daily. 03/22/13   Ricard Dillon, MD  lansoprazole (PREVACID SOLUTAB) 30 MG disintegrating tablet Take 2 tablets (60 mg total) by mouth 2 (two) times daily. 03/14/13   Inda Castle, MD  levocetirizine (XYZAL) 5 MG tablet Take 5 mg by mouth every evening. Patient takes only as needed    Historical Provider, MD  metoCLOPramide (REGLAN) 5 MG tablet Take 5 mg by mouth 2 (two) times daily.    Historical Provider, MD  mometasone-formoterol (DULERA) 100-5 MCG/ACT AERO Take 2 puffs first thing in am and then another 2 puffs about 12 hours later. 01/28/13   Tanda Rockers, MD  mupirocin ointment (BACTROBAN) 2 % Apply topically 3 (three) times daily. 02/27/13   Timoteo Gaul, FNP  PATADAY 0.2 % SOLN  03/03/13   Historical Provider, MD  phenol (CHLORASEPTIC) 1.4 %  LIQD Use as directed 2 sprays in the mouth or throat as needed. 11/30/11   Radene Gunning, NP  promethazine (PHENERGAN) 25 MG suppository Place 1 suppository (25 mg total) rectally every 6 (six) hours as needed. 06/12/12   Ricard Dillon, MD  ranitidine (ZANTAC) 75 MG tablet Take 2 tablets (150 mg total) by mouth 2 (two) times daily. 07/31/13   Timoteo Gaul, FNP  TOBRADEX ophthalmic ointment  06/11/13   Historical Provider, MD  [place] pillow 03/01/13   Timoteo Gaul, FNP   Physical Exam: Filed Vitals:   09/09/13 1430  BP: 91/68  Pulse:   Temp:   Resp: 15    BP 91/68  Pulse 134  Temp(Src) 101.8 F (38.8 C) (Rectal)  Resp 15  Ht 5' (1.524 m)  Wt 55.1 kg (121 lb 7.6 oz)  BMI 23.72 kg/m2  SpO2 93% Constitutional: Vital signs reviewed.  Patient is a well-developed and well-nourished  in no acute distress and cooperative with exam. Alert and oriented x3.  Head: Normocephalic and atraumatic Mouth: no erythema or exudates, slightly dry MM Eyes: PERRL, EOMI, conjunctivae normal, No scleral icterus.  Neck: Supple, Trachea midline normal ROM, No JVD, mass, thyromegaly, or carotid bruit present.  Cardiovascular: RRR, S1 normal, S2 normal, no MRG, pulses symmetric and intact bilaterally Pulmonary/Chest: Scattered rhonchi bilaterally, no wheezes. Secretions nonlabored. Abdominal: Soft. Non-tender, non-distended, bowel sounds are normal, no masses, organomegaly, or guarding present.  Extremities: No cyanosis and no edema. Right upper extremity resting tremors noted  Neurological: A&O x3, Strength is normal and symmetric bilaterally, cranial nerve II-XII are grossly intact, no focal motor deficit, sensory intact to light touch bilaterally.  Skin: Warm, dry and intact. No rash, cyanosis, or clubbing.  Psychiatric: Normal mood and affect.               Labs on Admission:  Basic Metabolic Panel:  Recent Labs Lab 09/09/13 1142  NA 135*  K 3.7  CL 95*  CO2 30   GLUCOSE 137*  BUN 15  CREATININE 0.71  CALCIUM 8.1*   Liver Function Tests:  Recent Labs Lab 09/09/13 1142  AST 15  ALT 11  ALKPHOS 96  BILITOT 0.8  PROT 5.6*  ALBUMIN 2.5*   No results found for this basename: LIPASE, AMYLASE,  in the last 168 hours No results found for this basename: AMMONIA,  in the last 168 hours CBC:  Recent Labs Lab 09/09/13 1142  WBC 21.8*  NEUTROABS 19.5*  HGB 12.1  HCT 37.3  MCV 80.0  PLT 328   Cardiac Enzymes: No results found for this basename: CKTOTAL, CKMB, CKMBINDEX, TROPONINI,  in the last 168 hours  BNP (last 3 results)  Recent Labs  02/25/13 1108  PROBNP 185.0*   CBG: No results found for this basename: GLUCAP,  in the last 168 hours  Radiological Exams on Admission: Dg Chest 2 View  09/09/2013   CLINICAL DATA:  Chest discomfort. Weakness. Shortness breath. History of asthma and high blood pressure  EXAM: CHEST  2 VIEW  COMPARISON:  07/31/2013 chest x-ray.  01/15/2013 chest CT.  FINDINGS: Post gastric pull-up with stent in place.  Cardiomegaly.  Calcified tortuous aorta.  Consolidation peripheral aspect right mid-lower lung zone. This is new from the prior examination and may represent infiltrate or atelectasis. Underlying mass not excluded. Follow-up until clearance recommended.  Limited evaluation of the retrocardiac region secondary to the gastric pull up and adjacent atelectasis/scarring.  Degenerative changes mid thoracic spine.  IMPRESSION: Post gastric pull-up with stent in place.  Cardiomegaly.  Calcified tortuous aorta.  Consolidation peripheral aspect right mid-lower lung zone. This is new from the prior examination and may represent infiltrate or atelectasis. Underlying mass not excluded. Follow-up until clearance recommended.  Limited evaluation of the retrocardiac region secondary to the gastric pull up and adjacent atelectasis/scarring.   Electronically Signed   By: Chauncey Cruel M.D.   On: 09/09/2013 13:17       Assessment/Plan Active Problems:  CAP (community acquired pneumonia) -As discussed above, will obtain blood, sputum cultures, strep pneumo antigen Legionella urine antigen as per protocol -Continue empiric antibiotics with Rocephin and doxycycline  -She has had some aspiration in the past secondary to the GI problems listed below, consult speech therapy for swallow eval, continue aspiration precautions   Sepsis syndrome -Secondary to #1, patient was tachycardic in EDP, febrile to 103.3, and initially BPs were stable but 86/68 on recheck. -Maybe responded to IV fluids, continue hydration and follow -Obtain blood and sputum cultures, Empiric antibiotics as above and follow.   GERD -Continue outpatient medications Gastric outlet obstruction  -s/p stents in the past. -Followed by GI/Dr. Deatra Ina Asthma/COPD -Stable, continue outpatient medications History of hematemesis -Followed by Dr. Deatra Ina -Hemoglobin stable at this time, Follow and recheck -Consult GI in a.m. for further recommendations regarding further anticoagulation. History of atrial fibrillation-with chronic anticoagulation/Coumadin held since Thursday 2/5 -She is in normal sinus rhythm at this time, when necessary IV metoprolol ordered>> follow.     Code Status: full Family Communication: family at bedside Disposition Plan: admit to step down  Time spent: >26mins  [facility] Pager [phone]

## 2013-09-09 NOTE — Telephone Encounter (Signed)
Pt is having a lot of diarrhea, coughing up some blood and very weak. [name] states they have taken the pt to the ER to be evaluated, they think she is dehydrated. Dr. Deatra Ina notified.

## 2013-09-09 NOTE — Telephone Encounter (Signed)
Calling to inform Dr. Benay Pillow that pt is on the way to [facility], pt is dehydrated and is going via ambulance

## 2013-09-09 NOTE — Telephone Encounter (Signed)
Pt at [facility] step down unit ICU. Pt has pneumonia and ?flu. Pt white count is up and her fever was 103. Pt niece would like bonnie are Dr Arnoldo Morale to call her today

## 2013-09-09 NOTE — ED Notes (Signed)
Per EMS- Patient has had flu-like symptoms including N/V/D x 3 days. Patient given Zofran 4 mg IV prior to arrival tot he ED. Patient has atrial fib and stopped her Coumadin 3 days ago as well.

## 2013-09-09 NOTE — Telephone Encounter (Signed)
[name] called again says Cailyn is doing worse and has not gotten a call back yet. Can we reach her at her cell # [phone]

## 2013-09-09 NOTE — ED Provider Notes (Signed)
CSN: [id number]     Arrival date & time 09/09/13  1108 History   First MD Initiated Contact with Patient 09/09/13 1123     Chief Complaint  Patient presents with  . flu-like symptoms       The history is provided by the patient and a relative.   Patient presents with concern of nausea, vomiting, diarrhea, fever. Symptoms began actually one week ago.  Since onset patient has also had mild cough, with associated hemoptysis.  Symptoms have been progressive, with no relief from anything. Notably, the patient stopped taking her Coumadin 3 days ago   she denies disorientation, confusion, syncope, chest pain. She endorses a history of multiple medical problems, including A. fib   Past Medical History  Diagnosis Date  . GERD (gastroesophageal reflux disease)   . Osteoporosis   . Postmenopausal HRT (hormone replacement therapy)   . Anemia   . Hypertension   . Allergy   . Asthma   . Shortness of breath   . PONV (postoperative nausea and vomiting)   . Pneumonia      Hx of pneumonia,71months ago  . Dysrhythmia     a fib  . Headache(784.0)   . Esophageal cancer     removed esophagus stomach pulled up   Past Surgical History  Procedure Laterality Date  . Left oophorectomy    . Esophageal removal of cancer,gastric ppull-thru 1993    . Cholecystectomy    . Rt arm fx    . Esophagogastroduodenoscopy  10/19/2011    Procedure: ESOPHAGOGASTRODUODENOSCOPY (EGD);  Surgeon: Milus Banister, MD;  Location: [facility];  Service: Endoscopy;  Laterality: N/A;  . Balloon dilation  10/19/2011    Procedure: BALLOON DILATION;  Surgeon: Milus Banister, MD;  Location: [facility];  Service: Endoscopy;  Laterality: N/A;  . Esophagogastroduodenoscopy  11/07/2011    Procedure: ESOPHAGOGASTRODUODENOSCOPY (EGD);  Surgeon: Ladene Artist, MD,FACG;  Location: [facility] ENDOSCOPY;  Service: Endoscopy;  Laterality: N/A;  . Esophagogastroduodenoscopy  11/22/2011    Procedure: ESOPHAGOGASTRODUODENOSCOPY (EGD);  Surgeon:  Inda Castle, MD;  Location: [facility];  Service: Endoscopy;  Laterality: N/A;  . Esophagogastroduodenoscopy  11/24/2011    Procedure: ESOPHAGOGASTRODUODENOSCOPY (EGD);  Surgeon: Inda Castle, MD;  Location: [facility];  Service: Endoscopy;  Laterality: N/A;  with removable duodenal stent (actually using esophageal partially covered 23X15 located in locked supply room  . Duodenal stent placement  11/24/2011    Procedure: DUODENAL STENT PLACEMENT;  Surgeon: Inda Castle, MD;  Location: [facility];  Service: Endoscopy;  Laterality: N/A;  . Video bronchoscopy Bilateral 01/18/2013    Procedure: VIDEO BRONCHOSCOPY WITHOUT FLUORO;  Surgeon: Tanda Rockers, MD;  Location: [facility] ENDOSCOPY;  Service: Cardiopulmonary;  Laterality: Bilateral;  . Esophagogastroduodenoscopy N/A 02/11/2013    Procedure: ESOPHAGOGASTRODUODENOSCOPY (EGD);  Surgeon: Inda Castle, MD;  Location: [facility];  Service: Endoscopy;  Laterality: N/A;  . Duodenal stent placement N/A 02/11/2013    Procedure: DUODENAL STENT PLACEMENT;  Surgeon: Inda Castle, MD;  Location: [facility];  Service: Endoscopy;  Laterality: N/A;   Family History  Problem Relation Age of Onset  . Cervical cancer Mother   . Heart disease Father     MI  . Coronary artery disease Brother   . Hypotension Sister   . Colon cancer Neg Hx   . Colon polyps Sister   . Pancreatic cancer      1/2 sister  . Coronary artery disease Sister     pacemaker  .  Asthma Brother   . Asthma Sister    History  Substance Use Topics  . Smoking status: Never Smoker   . Smokeless tobacco: Never Used  . Alcohol Use: No   OB History   Grav Para Term Preterm Abortions TAB SAB Ect Mult Living                 Review of Systems  Constitutional:       Per HPI, otherwise negative  HENT:       Per HPI, otherwise negative  Respiratory:       Per HPI, otherwise negative  Cardiovascular:       Per HPI, otherwise negative  Gastrointestinal: Positive for  nausea, vomiting and diarrhea. Negative for abdominal pain.  Endocrine:       Negative aside from HPI  Genitourinary:       Neg aside from HPI   Musculoskeletal:       Per HPI, otherwise negative  Skin: Negative.   Neurological: Negative for syncope.      Allergies  Erythromycin ethylsuccinate and Prednisone  Home Medications   Current Outpatient Rx  Name  Route  Sig  Dispense  Refill  . acetaminophen (TYLENOL) 500 MG tablet   Oral   Take 500 mg by mouth daily as needed for headache.         . albuterol (PROVENTIL HFA;VENTOLIN HFA) 108 (90 BASE) MCG/ACT inhaler   Inhalation   Inhale 2 puffs into the lungs every 6 (six) hours as needed. For wheezing   1 Inhaler   11   . arformoterol (BROVANA) 15 MCG/2ML NEBU   Nebulization   Take 2 mLs (15 mcg total) by nebulization 2 (two) times daily. Dx: 493.00   120 mL   12   . atenolol-chlorthalidone (TENORETIC) 50-25 MG per tablet   Oral   Take 1 tablet by mouth daily.         . budesonide (PULMICORT) 0.25 MG/2ML nebulizer solution   Nebulization   Take 2 mLs (0.25 mg total) by nebulization 2 (two) times daily. Dx: 493.00   120 mL   12   . cefUROXime (CEFTIN) 250 MG tablet   Oral   Take 250 mg by mouth 2 (two) times daily with a meal.         . chlorpheniramine-HYDROcodone (TUSSIONEX PENNKINETIC ER) 10-8 MG/5ML LQCR   Oral   Take 5 mLs by mouth every 12 (twelve) hours as needed for cough (sedation caution).   120 mL   0   . digoxin (LANOXIN) 0.125 MG tablet   Oral   Take 1 tablet (0.125 mg total) by mouth daily.   90 tablet   3   . sucralfate (CARAFATE) 1 GM/10ML suspension   Oral   Take 10 mLs (1 g total) by mouth 2 (two) times daily.   1800 mL   3   . traMADol (ULTRAM) 50 MG tablet   Oral   Take 1 tablet (50 mg total) by mouth every 8 (eight) hours as needed for pain.   30 tablet   5   . Vitamin D, Ergocalciferol, (DRISDOL) 50000 UNITS CAPS   Oral   Take 1 capsule (50,000 Units total) by mouth  every 7 (seven) days. On Friday   30 capsule   3   . warfarin (COUMADIN) 5 MG tablet   Oral   Take 5 mg by mouth daily.          Marland Kitchen zolpidem (AMBIEN) 5 MG  tablet   Oral   Take 1 tablet (5 mg total) by mouth at bedtime as needed for sleep.   90 tablet   1   . bisoprolol-hydrochlorothiazide (ZIAC) 2.5-6.25 MG per tablet   Oral   Take 1 tablet by mouth daily.          . Calcium-Vitamin D-Vitamin K (VIACTIV) 948-546-27 MG-UNT-MCG CHEW   Oral   Chew by mouth 3 (three) times daily.           . Fe Fum-FA-B Cmp-C-Zn-Mg-Mn-Cu (HEMOCYTE PLUS) 106-1 MG CAPS   Oral   Take 106 mg by mouth 2 (two) times daily.   180 each   3   . lansoprazole (PREVACID SOLUTAB) 30 MG disintegrating tablet   Oral   Take 2 tablets (60 mg total) by mouth 2 (two) times daily.   360 tablet   3   . levocetirizine (XYZAL) 5 MG tablet   Oral   Take 5 mg by mouth every evening. Patient takes only as needed         . metoCLOPramide (REGLAN) 5 MG tablet   Oral   Take 5 mg by mouth 2 (two) times daily.         . mometasone-formoterol (DULERA) 100-5 MCG/ACT AERO      Take 2 puffs first thing in am and then another 2 puffs about 12 hours later.   1 Inhaler   11   . mupirocin ointment (BACTROBAN) 2 %   Topical   Apply topically 3 (three) times daily.   22 g   0   . PATADAY 0.2 % SOLN               . phenol (CHLORASEPTIC) 1.4 % LIQD   Mouth/Throat   Use as directed 2 sprays in the mouth or throat as needed.         . promethazine (PHENERGAN) 25 MG suppository   Rectal   Place 1 suppository (25 mg total) rectally every 6 (six) hours as needed.   36 each   3   . ranitidine (ZANTAC) 75 MG tablet   Oral   Take 2 tablets (150 mg total) by mouth 2 (two) times daily.   60 tablet   1   . TOBRADEX ophthalmic ointment               . UNABLE TO FIND      Jelly pillow   1 Package   0     Dx. Pressure ulcer, stage 2 707.00, 707.22    BP 129/97  Pulse 94  Temp(Src) 103.3 F  (39.6 C) (Rectal)  Resp 21  SpO2 92% Physical Exam  Nursing note and vitals reviewed. Constitutional: She is oriented to person, place, and time. She appears well-nourished. She has a sickly appearance.  HENT:  Head: Normocephalic and atraumatic.  Eyes: Conjunctivae and EOM are normal.  Cardiovascular: Normal rate.  An irregularly irregular rhythm present.  Pulmonary/Chest: No stridor. Tachypnea noted. She has decreased breath sounds.  Abdominal: She exhibits no distension.  Musculoskeletal: She exhibits no edema.  Neurological: She is alert and oriented to person, place, and time. No cranial nerve deficit.  Skin: Skin is warm and dry.  Psychiatric: She has a normal mood and affect.    ED Course  Procedures (including critical care time) Labs Review Labs Reviewed  CBC WITH DIFFERENTIAL - Abnormal; Notable for the following:    WBC 21.8 (*)    RDW 15.8 (*)  Neutrophils Relative % 90 (*)    Neutro Abs 19.5 (*)    Lymphocytes Relative 3 (*)    Monocytes Absolute 1.3 (*)    All other components within normal limits  COMPREHENSIVE METABOLIC PANEL - Abnormal; Notable for the following:    Sodium 135 (*)    Chloride 95 (*)    Glucose, Bld 137 (*)    Calcium 8.1 (*)    Total Protein 5.6 (*)    Albumin 2.5 (*)    GFR calc non Af Amer 78 (*)    All other components within normal limits  URINALYSIS, ROUTINE W REFLEX MICROSCOPIC - Abnormal; Notable for the following:    Color, Urine AMBER (*)    APPearance CLOUDY (*)    Hgb urine dipstick TRACE (*)    Leukocytes, UA LARGE (*)    All other components within normal limits  URINE MICROSCOPIC-ADD ON - Abnormal; Notable for the following:    Bacteria, UA MANY (*)    All other components within normal limits  RESPIRATORY VIRUS PANEL  URINE CULTURE  PROTIME-INR  CG4 I-STAT (LACTIC ACID)   Imaging Review Dg Chest 2 View  09/09/2013   CLINICAL DATA:  Chest discomfort. Weakness. Shortness breath. History of asthma and high blood  pressure  EXAM: CHEST  2 VIEW  COMPARISON:  07/31/2013 chest x-ray.  01/15/2013 chest CT.  FINDINGS: Post gastric pull-up with stent in place.  Cardiomegaly.  Calcified tortuous aorta.  Consolidation peripheral aspect right mid-lower lung zone. This is new from the prior examination and may represent infiltrate or atelectasis. Underlying mass not excluded. Follow-up until clearance recommended.  Limited evaluation of the retrocardiac region secondary to the gastric pull up and adjacent atelectasis/scarring.  Degenerative changes mid thoracic spine.  IMPRESSION: Post gastric pull-up with stent in place.  Cardiomegaly.  Calcified tortuous aorta.  Consolidation peripheral aspect right mid-lower lung zone. This is new from the prior examination and may represent infiltrate or atelectasis. Underlying mass not excluded. Follow-up until clearance recommended.  Limited evaluation of the retrocardiac region secondary to the gastric pull up and adjacent atelectasis/scarring.   Electronically Signed   By: Chauncey Cruel M.D.   On: 09/09/2013 13:17    EKG Interpretation    Date/Time:  Monday September 09 2013 11:20:07 EST Ventricular Rate:  93 PR Interval:  150 QRS Duration: 72 QT Interval:  332 QTC Calculation: 413 R Axis:   43 Text Interpretation:  Sinus rhythm Borderline low voltage, extremity leads Sinus rhythm Artifact sub-threshhold ST depressions laterally Abnormal ekg Confirmed by Carmin Muskrat  MD ([id number]) on 09/09/2013 11:25:18 AM           Update: On exam the patient appears comfortable, states that she feels better.  However, the patient's blood pressure has decreased, to systolic 150. Patient remains febrile, though less so. Patient will be admitted to the hospitalist service MDM   Patient presents with flulike illness.  The patient's evaluation demonstrates a notable leukocytosis, x-ray evidence of pneumonia.  Respiratory panel is pending, but the patient required admission for further  evaluation and management.  ABX initiated in ED.     Carmin Muskrat, MD 09/09/13 1425

## 2013-09-09 NOTE — Telephone Encounter (Signed)
Noted by dr Arnoldo Morale

## 2013-09-09 NOTE — Telephone Encounter (Signed)
Noted  

## 2013-09-10 DIAGNOSIS — A419 Sepsis, unspecified organism: Principal | ICD-10-CM

## 2013-09-10 DIAGNOSIS — K920 Hematemesis: Secondary | ICD-10-CM | POA: Diagnosis present

## 2013-09-10 DIAGNOSIS — D62 Acute posthemorrhagic anemia: Secondary | ICD-10-CM | POA: Diagnosis present

## 2013-09-10 LAB — BASIC METABOLIC PANEL
BUN: 12 mg/dL (ref 6–23)
CO2: 30 mEq/L (ref 19–32)
CREATININE: 0.7 mg/dL (ref 0.50–1.10)
Calcium: 7.1 mg/dL — ABNORMAL LOW (ref 8.4–10.5)
Chloride: 99 mEq/L (ref 96–112)
GFR calc non Af Amer: 79 mL/min — ABNORMAL LOW (ref >90)
Glucose, Bld: 100 mg/dL — ABNORMAL HIGH (ref 70–99)
Potassium: 3.1 mEq/L — ABNORMAL LOW (ref 3.7–5.3)
Sodium: 136 mEq/L — ABNORMAL LOW (ref 137–147)

## 2013-09-10 LAB — CBC
HCT: 33.8 % — ABNORMAL LOW (ref 36.0–46.0)
Hemoglobin: 10.7 g/dL — ABNORMAL LOW (ref 12.0–15.0)
MCH: 25.9 pg — ABNORMAL LOW (ref 26.0–34.0)
MCHC: 31.7 g/dL (ref 30.0–36.0)
MCV: 81.8 fL (ref 78.0–100.0)
PLATELETS: 315 10*3/uL (ref 150–400)
RBC: 4.13 MIL/uL (ref 3.87–5.11)
RDW: 16.1 % — AB (ref 11.5–15.5)
WBC: 14.6 10*3/uL — AB (ref 4.0–10.5)

## 2013-09-10 LAB — RESPIRATORY VIRUS PANEL
Adenovirus: NOT DETECTED
INFLUENZA A H1: NOT DETECTED
INFLUENZA A H3: NOT DETECTED
INFLUENZA A: NOT DETECTED
Influenza B: NOT DETECTED
Metapneumovirus: NOT DETECTED
PARAINFLUENZA 3 A: NOT DETECTED
Parainfluenza 1: NOT DETECTED
Parainfluenza 2: NOT DETECTED
RESPIRATORY SYNCYTIAL VIRUS A: NOT DETECTED
Respiratory Syncytial Virus B: NOT DETECTED
Rhinovirus: NOT DETECTED

## 2013-09-10 LAB — LEGIONELLA ANTIGEN, URINE: Legionella Antigen, Urine: NEGATIVE

## 2013-09-10 LAB — CLOSTRIDIUM DIFFICILE BY PCR: Toxigenic C. Difficile by PCR: NEGATIVE

## 2013-09-10 LAB — STREP PNEUMONIAE URINARY ANTIGEN: Strep Pneumo Urinary Antigen: NEGATIVE

## 2013-09-10 MED ORDER — SODIUM CHLORIDE 0.9 % IV BOLUS (SEPSIS)
500.0000 mL | Freq: Once | INTRAVENOUS | Status: AC
  Administered 2013-09-10: 500 mL via INTRAVENOUS

## 2013-09-10 MED ORDER — POTASSIUM CHLORIDE CRYS ER 20 MEQ PO TBCR
40.0000 meq | EXTENDED_RELEASE_TABLET | ORAL | Status: AC
  Administered 2013-09-10 (×2): 40 meq via ORAL
  Filled 2013-09-10 (×3): qty 2

## 2013-09-10 NOTE — Progress Notes (Signed)
[facility] PROGRESS NOTE  [name] DGU:[id number] DOB: [birth year] DOA: 09/09/2013 PCP: Georgetta Haber, MD  Assessment/Plan: CAP (community acquired pneumonia)  -Continue empiric antibiotics with Rocephin and doxycycline  -Leukocytosis improving, BP soft  but stable today -C. difficile negative and influenza PCR also negative. Follow up in culture -She has had some aspiration in the past secondary to the GI problems listed below, consult speech therapy for swallow eval, continue aspiration precautions  Sepsis syndrome  -Secondary to #1, patient was tachycardic in EDP, febrile to 103.3, and initially BPs were stable but 86/68 on recheck.  -Continue hydration with IV fluids, as above BP stable this also will continue holding antihypertensives for now  -Improving clinically as above, continue GERD  -Continue outpatient medications  Gastric outlet obstruction  -s/p stents in the past.  -Followed by GI/Dr. Deatra Ina  Asthma/COPD  -Stable, continue outpatient medications  History of hematemesis  -Followed by Dr. Deatra Ina  -Hemoglobin trending down with hydration, no gross bleeding reported. -I. have consulted GI further recommendations regarding further anticoagulation.  History of atrial fibrillation-with chronic anticoagulation/Coumadin held since Thursday 2/5  -She is in normal sinus rhythm at this time, when necessary IV metoprolol ordered>> follow. Hypokalemia -Replace potassium  Code Status: Full Family Communication: None at bedside Disposition Plan: Transfer to telemetry   Consultants:  GI  Procedures:  None  Antibiotics:  Rocephin and doxycycline started on 2/8  HPI/Subjective: States she feels better today-breathing better and coughing less. States she has had no further hematemesis since Sunday  Objective: Filed Vitals:   09/10/13 0700  BP: 107/48  Pulse: 67  Temp:   Resp: 19    Intake/Output Summary (Last 24 hours) at 09/10/13 1056 Last  data filed at 09/10/13 0700  Gross per 24 hour  Intake   1900 ml  Output      0 ml  Net   1900 ml   Filed Weights   09/09/13 1529  Weight: 55.1 kg (121 lb 7.6 oz)    Exam:  General: alert & oriented x 3 In NAD Cardiovascular: RRR, nl S1 s2 Respiratory: Scattered rhonchi Abdomen: soft +BS NT/ND, no masses palpable Extremities: No cyanosis and no edema    Data Reviewed: Basic Metabolic Panel:  Recent Labs Lab 09/09/13 1142 09/09/13 1755 09/10/13 0330  NA 135* 135* 136*  K 3.7 3.5* 3.1*  CL 95* 97 99  CO2 30 28 30   GLUCOSE 137* 124* 100*  BUN 15 15 12   CREATININE 0.71 0.74 0.70  CALCIUM 8.1* 7.5* 7.1*   Liver Function Tests:  Recent Labs Lab 09/09/13 1142  AST 15  ALT 11  ALKPHOS 96  BILITOT 0.8  PROT 5.6*  ALBUMIN 2.5*   No results found for this basename: LIPASE, AMYLASE,  in the last 168 hours No results found for this basename: AMMONIA,  in the last 168 hours CBC:  Recent Labs Lab 09/09/13 1142 09/10/13 0330  WBC 21.8* 14.6*  NEUTROABS 19.5*  --   HGB 12.1 10.7*  HCT 37.3 33.8*  MCV 80.0 81.8  PLT 328 315   Cardiac Enzymes: No results found for this basename: CKTOTAL, CKMB, CKMBINDEX, TROPONINI,  in the last 168 hours BNP (last 3 results)  Recent Labs  02/25/13 1108  PROBNP 185.0*   CBG: No results found for this basename: GLUCAP,  in the last 168 hours  Recent Results (from the past 240 hour(s))  URINE CULTURE     Status: None   Collection Time    09/09/13  12:14 PM      Result Value Range Status   Specimen Description URINE, RANDOM   Final   Special Requests NONE   Final   Culture  Setup Time     Final   Value: 09/09/2013 21:01     Performed at [place]     Final   Value: >=100,000 COLONIES/ML     Performed at [facility]   Culture     Final   Value: ESCHERICHIA COLI     Performed at [facility]   Report Status PENDING   Incomplete  MRSA PCR SCREENING     Status: None    Collection Time    09/09/13  3:21 PM      Result Value Range Status   MRSA by PCR NEGATIVE  NEGATIVE Final   Comment:            The GeneXpert MRSA Assay (FDA     approved for NASAL specimens     only), is one component of a     comprehensive MRSA colonization     surveillance program. It is not     intended to diagnose MRSA     infection nor to guide or     monitor treatment for     MRSA infections.  CULTURE, EXPECTORATED SPUTUM-ASSESSMENT     Status: None   Collection Time    09/09/13  6:25 PM      Result Value Range Status   Specimen Description SPUTUM   Final   Special Requests NONE   Final   Sputum evaluation     Final   Value: THIS SPECIMEN IS ACCEPTABLE. RESPIRATORY CULTURE REPORT TO FOLLOW.   Report Status 09/09/2013 FINAL   Final  CULTURE, RESPIRATORY (NON-EXPECTORATED)     Status: None   Collection Time    09/09/13  7:41 PM      Result Value Range Status   Specimen Description SPUTUM   Final   Special Requests NONE   Final   Gram Stain     Final   Value: FEW WBC PRESENT, PREDOMINANTLY PMN     RARE SQUAMOUS EPITHELIAL CELLS PRESENT     FEW GRAM POSITIVE COCCI     IN PAIRS IN CHAINS RARE GRAM NEGATIVE RODS     Performed at [facility]   Culture PENDING   Incomplete   Report Status PENDING   Incomplete  CLOSTRIDIUM DIFFICILE BY PCR     Status: None   Collection Time    09/09/13 10:00 PM      Result Value Range Status   C difficile by pcr NEGATIVE  NEGATIVE Final   Comment: Performed at [facility]     Studies: Dg Chest 2 View  09/09/2013   CLINICAL DATA:  Chest discomfort. Weakness. Shortness breath. History of asthma and high blood pressure  EXAM: CHEST  2 VIEW  COMPARISON:  07/31/2013 chest x-ray.  01/15/2013 chest CT.  FINDINGS: Post gastric pull-up with stent in place.  Cardiomegaly.  Calcified tortuous aorta.  Consolidation peripheral aspect right mid-lower lung zone. This is new from the prior examination and may represent infiltrate or  atelectasis. Underlying mass not excluded. Follow-up until clearance recommended.  Limited evaluation of the retrocardiac region secondary to the gastric pull up and adjacent atelectasis/scarring.  Degenerative changes mid thoracic spine.  IMPRESSION: Post gastric pull-up with stent in place.  Cardiomegaly.  Calcified tortuous aorta.  Consolidation peripheral aspect right  mid-lower lung zone. This is new from the prior examination and may represent infiltrate or atelectasis. Underlying mass not excluded. Follow-up until clearance recommended.  Limited evaluation of the retrocardiac region secondary to the gastric pull up and adjacent atelectasis/scarring.   Electronically Signed   By: Chauncey Cruel M.D.   On: 09/09/2013 13:17    Scheduled Meds: . arformoterol  15 mcg Nebulization BID  . B-complex with vitamin C  1 tablet Oral BID  . budesonide  0.25 mg Nebulization BID  . calcium-vitamin D  1 tablet Oral TID  . cefTRIAXone (ROCEPHIN)  IV  1 g Intravenous Q24H  . digoxin  0.125 mg Oral Daily  . doxycycline  100 mg Oral Q12H  . enoxaparin (LOVENOX) injection  40 mg Subcutaneous Q24H  . lansoprazole  60 mg Oral BID  . metoCLOPramide  5 mg Oral BID  . potassium chloride  40 mEq Oral Q4H  . sucralfate  1 g Oral BID   Continuous Infusions: . sodium chloride 100 mL/hr at 09/10/13 0146    Active Problems:   GERD   Gastric outlet obstruction   CAP (community acquired pneumonia)   Sepsis   PNA (pneumonia)   A-fib   Unspecified asthma(493.90)    Time spent: 4    [facility] Pager [phone]. If 7PM-7AM, please contact night-coverage at [email or web address], password [facility] 09/10/2013, 10:56 AM  LOS: 1 day

## 2013-09-10 NOTE — Evaluation (Signed)
Clinical/Bedside Swallow Evaluation Patient Details  Name: [name] MRN: [id number] Date of Birth: [birth year]  Today's Date: 09/10/2013 Time: 1230-1247 SLP Time Calculation (min): 17 min  Past Medical History:  Past Medical History  Diagnosis Date  . GERD (gastroesophageal reflux disease)   . Osteoporosis   . Postmenopausal HRT (hormone replacement therapy)   . Anemia   . Hypertension   . Allergy   . Asthma   . Shortness of breath   . PONV (postoperative nausea and vomiting)   . Pneumonia      Hx of pneumonia,27months ago  . Dysrhythmia     a fib  . Headache(784.0)   . Esophageal cancer     removed esophagus stomach pulled up   Past Surgical History:  Past Surgical History  Procedure Laterality Date  . Left oophorectomy    . Esophageal removal of cancer,gastric ppull-thru 1993    . Cholecystectomy    . Rt arm fx    . Esophagogastroduodenoscopy  10/19/2011    Procedure: ESOPHAGOGASTRODUODENOSCOPY (EGD);  Surgeon: Milus Banister, MD;  Location: [facility];  Service: Endoscopy;  Laterality: N/A;  . Balloon dilation  10/19/2011    Procedure: BALLOON DILATION;  Surgeon: Milus Banister, MD;  Location: [facility];  Service: Endoscopy;  Laterality: N/A;  . Esophagogastroduodenoscopy  11/07/2011    Procedure: ESOPHAGOGASTRODUODENOSCOPY (EGD);  Surgeon: Ladene Artist, MD,FACG;  Location: [facility] ENDOSCOPY;  Service: Endoscopy;  Laterality: N/A;  . Esophagogastroduodenoscopy  11/22/2011    Procedure: ESOPHAGOGASTRODUODENOSCOPY (EGD);  Surgeon: Inda Castle, MD;  Location: [facility];  Service: Endoscopy;  Laterality: N/A;  . Esophagogastroduodenoscopy  11/24/2011    Procedure: ESOPHAGOGASTRODUODENOSCOPY (EGD);  Surgeon: Inda Castle, MD;  Location: [facility];  Service: Endoscopy;  Laterality: N/A;  with removable duodenal stent (actually using esophageal partially covered 23X15 located in locked supply room  . Duodenal stent placement  11/24/2011    Procedure:  DUODENAL STENT PLACEMENT;  Surgeon: Inda Castle, MD;  Location: [facility];  Service: Endoscopy;  Laterality: N/A;  . Video bronchoscopy Bilateral 01/18/2013    Procedure: VIDEO BRONCHOSCOPY WITHOUT FLUORO;  Surgeon: Tanda Rockers, MD;  Location: [facility]OSCOPY;  Service: Cardiopulmonary;  Laterality: Bilateral;  . Esophagogastroduodenoscopy N/A 02/11/2013    Procedure: ESOPHAGOGASTRODUODENOSCOPY (EGD);  Surgeon: Inda Castle, MD;  Location: [facility];  Service: Endoscopy;  Laterality: N/A;  . Duodenal stent placement N/A 02/11/2013    Procedure: DUODENAL STENT PLACEMENT;  Surgeon: Inda Castle, MD;  Location: [facility];  Service: Endoscopy;  Laterality: N/A;   HPI:  80 yo female adm to [facility] with shortness of breath - diagnosed with CAP.  PMH + for gastric outlet obstruction, esophageal cancer s/p stent placements - last being June 2014.  Pt does not have neurological hx, does have appearance of mild tremor in right hand which she attributes to Reglan use.     Assessment / Plan / Recommendation Clinical Impression  Pt does not have blantant cranial nerve deficits impacting swallowing musculature.  She reports she has weakness on the left side of her neck and is receiving pt tx for it.  No indications of aspiration or airway compromise with po observed.  Pt with functional oropharyngeal swallow ability with po observed.  Pt eats standing up at home and reports she walks around immediately after eating.  She eats two meals a day - at 7 am and 1 pm and sleeps in a recliner.  Pt does have large pill dysphagia and  takes large pills with butter at home. She does request large pills to be given crushed if not contraindicated and all pills one at a time.   Rec continue diet as tolerated and defer to GI MD if seeing pt as pt appears to manage her esophageal dysphagia beautifully!      Aspiration Risk  Mild    Diet Recommendation Regular;Thin liquid   Liquid Administration via:  Straw;Cup Medication Administration: Crushed with puree (if large and not contrainidcated) Supervision: Patient able to self feed Compensations: Slow rate;Small sips/bites Postural Changes and/or Swallow Maneuvers: Seated upright 90 degrees;Upright 30-60 min after meal    Other  Recommendations Oral Care Recommendations: Oral care BID   Follow Up Recommendations  None    Frequency and Duration        Pertinent Vitals/Pain Afebrile, decreased    SLP Swallow Goals     Swallow Study Prior Functional Status   see hhx     General Date of Onset: 09/10/13 HPI: 80 yo female adm to [facility] with shortness of breath - diagnosed with CAP.  PMH + for gastric outlet obstruction, esophageal cancer s/p stent placements - last being June 2014.  Pt does not have neurological hx, does have appearance of mild tremor in right hand which she attributes to Reglan use.   Type of Study: Bedside swallow evaluation Previous Swallow Assessment: UGI 112013 showed outlet empties only when pt is upright, GI Md recommended pt sit upright for 30 minutes after meals Diet Prior to this Study: Regular;Thin liquids (no intake after 1230 pm) Temperature Spikes Noted: No Respiratory Status: Room air History of Recent Intubation: No Behavior/Cognition: Alert;Cooperative;Pleasant mood Oral Cavity - Dentition: Adequate natural dentition Self-Feeding Abilities: Able to feed self Patient Positioning: Upright in bed Baseline Vocal Quality: Clear Volitional Cough: Strong Volitional Swallow: Able to elicit    Oral/Motor/Sensory Function Overall Oral Motor/Sensory Function: Appears within functional limits for tasks assessed   Ice Chips Ice chips: Not tested   Thin Liquid Thin Liquid: Within functional limits Presentation: Self Fed;Straw    Nectar Thick Nectar Thick Liquid: Not tested   Honey Thick Honey Thick Liquid: Not tested   Puree Puree: Within functional limits Presentation: Self Fed;Spoon   Solid   GO     Solid: Not tested Other Comments: pt consuming pudding but did not want other po after 1230 pm       Claudie Fisherman, [place] [facility] SLP [phone]

## 2013-09-10 NOTE — Consult Note (Addendum)
Consultation  Referring Provider: [facility] (Dr. Inis Sizer)     Primary Care Physician:  Georgetta Haber, MD Primary Gastroenterologist:  Erskine Emery, MD     Reason for Consultation:   GI bleed           HPI:   [name] is a 80 y.o. female with multiple medical problems including a complex GI history. She is s/p esophagectomy and gastric pullup for esophageal cancer in 1990's. She has a history of anastomotic stenosis requiring dilation in March 2013.  In April 2013 she underwent EGD for recurrent n/v with findings of a mild GEJ stricture and pyloric stenosis.  Duodenal stent placed at that time.  She did fairly well following stent placement but began having recurrent nausea / vomiting a few months later. In July 2014  a repeat EGD reveled recurrent GOO secondary to stenosis at very distal end of duodenal stent. Another stent was placed beyond stenotic area.  Nausea / vomiting improved but patient has continued to struggle with ongoing severe pyrosis despite Carafate and PPI.  She was admitted yesterday for diarrhea and spitting up blood. Patient was evaluated by pulmonary over the summer for cough /hemoptysis. She was felt to have chronic aspiration but bleeding not felt to be pulmonary related. Patient maintained on coumadin for atrial fibrillation.   Over the last several days patient has woken up in middle of night after "spitting up" blood. No preceding nausea. No coughing. Blood just ran up into her mouth and nose while sleeping. Patient had INR checked on 09/06/13, it was 3.9. Because of persistent bleeding patient stopped her coumadin Friday, no bleeding today. INR now 1.22.   Past Medical History  Diagnosis Date  . GERD (gastroesophageal reflux disease)   . Osteoporosis   . Postmenopausal HRT (hormone replacement therapy)   . Anemia   . Hypertension   . Allergy   . Asthma   . PONV (postoperative nausea and vomiting)   . Pneumonia      Hx of  pneumonia,55months ago  . Dysrhythmia     a fib  . Headache(784.0)   . Esophageal cancer     removed esophagus stomach pulled up    Past Surgical History  Procedure Laterality Date  . Left oophorectomy    . Esophageal removal of cancer,gastric ppull-thru 1993    . Cholecystectomy    . Rt arm fx    . Esophagogastroduodenoscopy  10/19/2011    Procedure: ESOPHAGOGASTRODUODENOSCOPY (EGD);  Surgeon: Milus Banister, MD;  Location: [facility];  Service: Endoscopy;  Laterality: N/A;  . Balloon dilation  10/19/2011    Procedure: BALLOON DILATION;  Surgeon: Milus Banister, MD;  Location: [facility];  Service: Endoscopy;  Laterality: N/A;  . Esophagogastroduodenoscopy  11/07/2011    Procedure: ESOPHAGOGASTRODUODENOSCOPY (EGD);  Surgeon: Ladene Artist, MD,FACG;  Location: [facility] ENDOSCOPY;  Service: Endoscopy;  Laterality: N/A;  . Esophagogastroduodenoscopy  11/22/2011    Procedure: ESOPHAGOGASTRODUODENOSCOPY (EGD);  Surgeon: Inda Castle, MD;  Location: [facility];  Service: Endoscopy;  Laterality: N/A;  . Esophagogastroduodenoscopy  11/24/2011    Procedure: ESOPHAGOGASTRODUODENOSCOPY (EGD);  Surgeon: Inda Castle, MD;  Location: [facility];  Service: Endoscopy;  Laterality: N/A;  with removable duodenal stent (actually using esophageal partially covered 23X15 located in locked supply room  . Duodenal stent placement  11/24/2011    Procedure: DUODENAL STENT PLACEMENT;  Surgeon: Inda Castle, MD;  Location: [facility];  Service: Endoscopy;  Laterality: N/A;  .  Video bronchoscopy Bilateral 01/18/2013    Procedure: VIDEO BRONCHOSCOPY WITHOUT FLUORO;  Surgeon: Tanda Rockers, MD;  Location: [facility];  Service: Cardiopulmonary;  Laterality: Bilateral;  . Esophagogastroduodenoscopy N/A 02/11/2013    Procedure: ESOPHAGOGASTRODUODENOSCOPY (EGD);  Surgeon: Inda Castle, MD;  Location: [facility];  Service: Endoscopy;  Laterality: N/A;  . Duodenal stent placement N/A 02/11/2013     Procedure: DUODENAL STENT PLACEMENT;  Surgeon: Inda Castle, MD;  Location: [facility];  Service: Endoscopy;  Laterality: N/A;    Family History  Problem Relation Age of Onset  . Cervical cancer Mother   . Heart disease Father     MI  . Coronary artery disease Brother   . Hypotension Sister   . Colon cancer Neg Hx   . Colon polyps Sister   . Pancreatic cancer      1/2 sister  . Coronary artery disease Sister     pacemaker  . Asthma Brother   . Asthma Sister      History  Substance Use Topics  . Smoking status: Never Smoker   . Smokeless tobacco: Never Used  . Alcohol Use: No    Prior to Admission medications   Medication Sig Start Date End Date Taking? Authorizing Provider  acetaminophen (TYLENOL) 500 MG tablet Take 500 mg by mouth daily as needed for headache.   Yes Historical Provider, MD  albuterol (PROVENTIL HFA;VENTOLIN HFA) 108 (90 BASE) MCG/ACT inhaler Inhale 2 puffs into the lungs every 6 (six) hours as needed. For wheezing 01/28/13  Yes Tanda Rockers, MD  arformoterol (BROVANA) 15 MCG/2ML NEBU Take 2 mLs (15 mcg total) by nebulization 2 (two) times daily. Dx: 493.00 02/25/13  Yes Tammy S Parrett, NP  atenolol-chlorthalidone (TENORETIC) 50-25 MG per tablet Take 1 tablet by mouth daily.   Yes Historical Provider, MD  budesonide (PULMICORT) 0.25 MG/2ML nebulizer solution Take 2 mLs (0.25 mg total) by nebulization 2 (two) times daily. Dx: 493.00 02/25/13  Yes Tammy S Parrett, NP  Calcium-Vitamin D-Vitamin K (VIACTIV) 762-831-51 MG-UNT-MCG CHEW Chew by mouth 3 (three) times daily.     Yes Historical Provider, MD  chlorpheniramine-HYDROcodone (TUSSIONEX PENNKINETIC ER) 10-8 MG/5ML LQCR Take 5 mLs by mouth every 12 (twelve) hours as needed for cough (sedation caution). 08/02/13  Yes Burnis Medin, MD  digoxin (LANOXIN) 0.125 MG tablet Take 1 tablet (0.125 mg total) by mouth daily. 12/21/12 12/21/13 Yes Ricard Dillon, MD  Fe Fum-FA-B Cmp-C-Zn-Mg-Mn-Cu (HEMOCYTE PLUS) 106-1 MG  CAPS Take 106 mg by mouth 2 (two) times daily. 03/22/13  Yes Ricard Dillon, MD  lansoprazole (PREVACID SOLUTAB) 30 MG disintegrating tablet Take 2 tablets (60 mg total) by mouth 2 (two) times daily. 03/14/13  Yes Inda Castle, MD  levocetirizine (XYZAL) 5 MG tablet Take 5 mg by mouth every evening. Patient takes only as needed   Yes Historical Provider, MD  metoCLOPramide (REGLAN) 5 MG tablet Take 5 mg by mouth 2 (two) times daily.   Yes Historical Provider, MD  mometasone-formoterol (DULERA) 100-5 MCG/ACT AERO Take 2 puffs first thing in am and then another 2 puffs about 12 hours later. 01/28/13  Yes Tanda Rockers, MD  Olopatadine HCl (PATADAY) 0.2 % SOLN Apply 1-2 drops to eye 2 (two) times daily as needed (dry, itchy eyes.).   Yes Historical Provider, MD  phenol (CHLORASEPTIC) 1.4 % LIQD Use as directed 1 spray in the mouth or throat as needed for throat irritation / pain.   Yes Historical Provider, MD  promethazine (PHENERGAN) 25 MG suppository Place 25 mg rectally every 6 (six) hours as needed for nausea or vomiting.   Yes Historical Provider, MD  sucralfate (CARAFATE) 1 GM/10ML suspension Take 10 mLs (1 g total) by mouth 2 (two) times daily. 12/21/12  Yes Ricard Dillon, MD  traMADol (ULTRAM) 50 MG tablet Take 50 mg by mouth every 6 (six) hours as needed for moderate pain.   Yes Historical Provider, MD  Vitamin D, Ergocalciferol, (DRISDOL) 50000 UNITS CAPS Take 1 capsule (50,000 Units total) by mouth every 7 (seven) days. On Friday 09/12/12  Yes Ricard Dillon, MD  warfarin (COUMADIN) 5 MG tablet Take 5 mg by mouth daily.  04/23/13  Yes Historical Provider, MD  zolpidem (AMBIEN) 5 MG tablet Take 1 tablet (5 mg total) by mouth at bedtime as needed for sleep. 03/22/13 03/22/14 Yes Ricard Dillon, MD    Current Facility-Administered Medications  Medication Dose Route Frequency Provider Last Rate Last Dose  . 0.9 %  sodium chloride infusion   Intravenous Continuous Sheila Oats, MD 100 mL/hr at  09/10/13 0146    . acetaminophen (TYLENOL) tablet 650 mg  650 mg Oral Q6H PRN Sheila Oats, MD       Or  . acetaminophen (TYLENOL) suppository 650 mg  650 mg Rectal Q6H PRN Adeline C Viyuoh, MD      . albuterol (PROVENTIL) (2.5 MG/3ML) 0.083% nebulizer solution 2.5 mg  2.5 mg Inhalation Q6H PRN Adeline C Viyuoh, MD      . arformoterol (BROVANA) nebulizer solution 15 mcg  15 mcg Nebulization BID Sheila Oats, MD   15 mcg at 09/10/13 0953  . B-complex with vitamin C tablet 1 tablet  1 tablet Oral BID Sheila Oats, MD   1 tablet at 09/10/13 1127  . budesonide (PULMICORT) nebulizer solution 0.25 mg  0.25 mg Nebulization BID Sheila Oats, MD   0.25 mg at 09/10/13 0953  . calcium-vitamin D (OSCAL WITH D) 500-200 MG-UNIT per tablet 1 tablet  1 tablet Oral TID Sheila Oats, MD   1 tablet at 09/10/13 1128  . cefTRIAXone (ROCEPHIN) 1 g in dextrose 5 % 50 mL IVPB  1 g Intravenous Q24H Adeline C Viyuoh, MD      . chlorpheniramine-HYDROcodone (TUSSIONEX) 10-8 MG/5ML suspension 5 mL  5 mL Oral Q12H PRN Adeline C Viyuoh, MD      . digoxin (LANOXIN) tablet 0.125 mg  0.125 mg Oral Daily Adeline C Viyuoh, MD   0.125 mg at 09/10/13 1005  . doxycycline (VIBRA-TABS) tablet 100 mg  100 mg Oral Q12H Adeline C Viyuoh, MD   100 mg at 09/10/13 1126  . enoxaparin (LOVENOX) injection 40 mg  40 mg Subcutaneous Q24H Sheila Oats, MD   40 mg at 09/09/13 2146  . lansoprazole (PREVACID) capsule 60 mg  60 mg Oral BID Sheila Oats, MD   60 mg at 09/10/13 1003  . loratadine (CLARITIN) tablet 10 mg  10 mg Oral Daily PRN Adeline C Viyuoh, MD      . metoCLOPramide (REGLAN) tablet 5 mg  5 mg Oral BID Adeline C Viyuoh, MD   5 mg at 09/10/13 1002  . metoprolol (LOPRESSOR) injection 2.5-5 mg  2.5-5 mg Intravenous Q6H PRN Adeline C Viyuoh, MD      . ondansetron (ZOFRAN) tablet 4 mg  4 mg Oral Q6H PRN Adeline C Viyuoh, MD       Or  . ondansetron (ZOFRAN) injection 4 mg  4 mg Intravenous Q6H PRN Sheila Oats,  MD   4 mg at 09/10/13 0019  . potassium chloride SA (K-DUR,KLOR-CON) CR tablet 40 mEq  40 mEq Oral Q4H Adeline C Viyuoh, MD   40 mEq at 09/10/13 1128  . sucralfate (CARAFATE) 1 GM/10ML suspension 1 g  1 g Oral BID Adeline C Viyuoh, MD   1 g at 09/10/13 1003  . traMADol (ULTRAM) tablet 50 mg  50 mg Oral Q8H PRN Adeline C Viyuoh, MD      . zolpidem (AMBIEN) tablet 5 mg  5 mg Oral QHS PRN Sheila Oats, MD   5 mg at 09/10/13 0022    Allergies as of 09/09/2013 - Review Complete 09/09/2013  Allergen Reaction Noted  . Erythromycin ethylsuccinate  09/26/2006  . Prednisone  09/26/2006    Review of Systems:    All systems reviewed and negative except where noted in HPI.   Physical Exam:  Vital signs in last 24 hours: Temp:  [97.9 F (36.6 C)-101.8 F (38.8 C)] 97.9 F (36.6 C) (02/10 0400) Pulse Rate:  [60-134] 67 (02/10 0700) Resp:  [15-28] 19 (02/10 0700) BP: (78-114)/(40-68) 107/48 mmHg (02/10 0700) SpO2:  [93 %-99 %] 97 % (02/10 0700) Weight:  [121 lb 7.6 oz (55.1 kg)] 121 lb 7.6 oz (55.1 kg) (02/09 1529) Last BM Date: 09/09/13 General:   Pleasant white female in NAD Head:  Normocephalic and atraumatic. Eyes:   No icterus.   Conjunctiva pink. Ears:  Normal auditory acuity. Neck:  Supple; no masses felt Lungs:  Respirations even and unlabored. Coarse breath sounds in bilateral upper and lower lobes.   Heart:  Regular rate and rhythm. Abdomen:  Soft, nondistended, nontender. Normal bowel sounds. No appreciable masses or hepatomegaly.  Rectal:  Not performed.  Msk:  Symmetrical without gross deformities.  Extremities:  Without edema. Neurologic:  Alert and  oriented x4;  grossly normal neurologically. Skin:  Intact without significant lesions or rashes. Cervical Nodes:  No significant cervical adenopathy. Psych:  Alert and cooperative. Normal affect.  LAB RESULTS:  Recent Labs  09/09/13 1142 09/10/13 0330  WBC 21.8* 14.6*  HGB 12.1 10.7*  HCT 37.3 33.8*  PLT 328 315    BMET  Recent Labs  09/09/13 1142 09/09/13 1755 09/10/13 0330  NA 135* 135* 136*  K 3.7 3.5* 3.1*  CL 95* 97 99  CO2 30 28 30   GLUCOSE 137* 124* 100*  BUN 15 15 12   CREATININE 0.71 0.74 0.70  CALCIUM 8.1* 7.5* 7.1*   LFT  Recent Labs  09/09/13 1142  PROT 5.6*  ALBUMIN 2.5*  AST 15  ALT 11  ALKPHOS 96  BILITOT 0.8   PT/INR  Recent Labs  09/09/13 1142  LABPROT 15.1  INR 1.22    STUDIES: Dg Chest 2 View  09/09/2013   CLINICAL DATA:  Chest discomfort. Weakness. Shortness breath. History of asthma and high blood pressure  EXAM: CHEST  2 VIEW  COMPARISON:  07/31/2013 chest x-ray.  01/15/2013 chest CT.  FINDINGS: Post gastric pull-up with stent in place.  Cardiomegaly.  Calcified tortuous aorta.  Consolidation peripheral aspect right mid-lower lung zone. This is new from the prior examination and may represent infiltrate or atelectasis. Underlying mass not excluded. Follow-up until clearance recommended.  Limited evaluation of the retrocardiac region secondary to the gastric pull up and adjacent atelectasis/scarring.  Degenerative changes mid thoracic spine.  IMPRESSION: Post gastric pull-up with stent in place.  Cardiomegaly.  Calcified tortuous aorta.  Consolidation  peripheral aspect right mid-lower lung zone. This is new from the prior examination and may represent infiltrate or atelectasis. Underlying mass not excluded. Follow-up until clearance recommended.  Limited evaluation of the retrocardiac region secondary to the gastric pull up and adjacent atelectasis/scarring.   Electronically Signed   By: Chauncey Cruel M.D.   On: 09/09/2013 13:17   PREVIOUS ENDOSCOPIES:            EGD July [id number]  COMPLICATIONS: There were no complications.  ENDOSCOPIC IMPRESSION:  1. recurrent gastric outlet obstruction secondary to stenosis at  the very distal end of the duodenal stent-status post successful  placement of duodenal stent beyond the stenotic area    EGD March 2013   ENDOSCOPIC IMPRESSION:  1) Anastomosis at 17-18cm from incisors. This was focally  stenotic and was dilated to 63mm.  2) Moderate gastritis, biopsied to check for H. pylori  3) Otherwise normal examination  RECOMMENDATIONS:  Will add carafate (25ml three times daily). New prescription  was called into your pharmacy.  IF biopsies show H. pylori you will be started on appropriate  antibiotics.  ______________________________  Milus Banister, MD    Impression / Plan:   26. 80 year old female, s/p remote esophagectomy and gastric pullup for esophageal cancer in 1990's.  2. History of recurrent GOO (tortuous, collapsed pylorus) s/p duodenal stent placement April 2013. Current abdominal films reveal stent in place. No nausea / vomiting at home. She has occasional regurgitation depending on diet.   3. Hematemesis vrs hemoptysis. Sounds mostly like she has been regurgitation blood from stomach in setting of coumadin. Coumadin on hold. Rule out anastomotic bleed, erosive disease, PUD. For further evaluation patient will be scheduled for am EGD. Hgb down 2-3 grams from baseline of 13grams. Will hold tonight's lovenox. -Acute blood loss anemia  4. Fever, tachycardia, hypotension and leukocytosis. She is being treated for sepsis syndrome / CAP. Fever resolved. WBC down from 21 to 14. . Abnormal breath sounds but off oxygen and sats okay.  5. Loose stools. This sounds more chronic than acute but may be worse lately. Not having severe diarrhea. C-diff is negative. Antibiotic related diarrhea is a possibility.    6. Atrial fibrillation, home coumadin on hold. INR 1.2.   7. Hypokalemia, repletion in progress. Check am BMET Thanks   LOS: 1 day   Tye Savoy  09/10/2013, 11:41 AM    [facility] Attending  I have also seen and assessed the patient and agree with the above note. Complicated patient - sounds like she had hematemesis while on warfarin. Investigate with EGD - The risks and  benefits as well as alternatives of endoscopic procedure(s) have been discussed and reviewed. All questions answered. The patient agrees to proceed.  Gatha Mayer, MD, [facility] [phone] (pager) 09/10/2013 5:17 PM

## 2013-09-11 ENCOUNTER — Encounter (HOSPITAL_COMMUNITY): Payer: Self-pay | Admitting: *Deleted

## 2013-09-11 ENCOUNTER — Encounter (HOSPITAL_COMMUNITY)
Admission: EM | Disposition: A | Discharge status: Skilled Nursing Facility | Payer: Self-pay | Source: Home / Self Care | Attending: Internal Medicine

## 2013-09-11 HISTORY — PX: ESOPHAGOGASTRODUODENOSCOPY: SHX5428

## 2013-09-11 LAB — URINE CULTURE: Colony Count: >=100000

## 2013-09-11 LAB — BASIC METABOLIC PANEL
BUN: 5 mg/dL — ABNORMAL LOW (ref 6–23)
CO2: 27 mEq/L (ref 19–32)
Calcium: 7.6 mg/dL — ABNORMAL LOW (ref 8.4–10.5)
Chloride: 102 mEq/L (ref 96–112)
Creatinine, Ser: 0.64 mg/dL (ref 0.50–1.10)
GFR calc Af Amer: >90 mL/min (ref >90)
GFR calc non Af Amer: 81 mL/min — ABNORMAL LOW (ref >90)
GLUCOSE: 106 mg/dL — AB (ref 70–99)
POTASSIUM: 3.9 meq/L (ref 3.7–5.3)
Sodium: 136 mEq/L — ABNORMAL LOW (ref 137–147)

## 2013-09-11 LAB — CBC
HCT: 35.9 % — ABNORMAL LOW (ref 36.0–46.0)
HEMOGLOBIN: 11.1 g/dL — AB (ref 12.0–15.0)
MCH: 25.3 pg — ABNORMAL LOW (ref 26.0–34.0)
MCHC: 30.9 g/dL (ref 30.0–36.0)
MCV: 82 fL (ref 78.0–100.0)
PLATELETS: 320 10*3/uL (ref 150–400)
RBC: 4.38 MIL/uL (ref 3.87–5.11)
RDW: 16 % — ABNORMAL HIGH (ref 11.5–15.5)
WBC: 12.9 10*3/uL — ABNORMAL HIGH (ref 4.0–10.5)

## 2013-09-11 SURGERY — EGD (ESOPHAGOGASTRODUODENOSCOPY)
Anesthesia: Moderate Sedation

## 2013-09-11 MED ORDER — BUTAMBEN-TETRACAINE-BENZOCAINE 2-2-14 % EX AERO
INHALATION_SPRAY | CUTANEOUS | Status: DC | PRN
  Administered 2013-09-11: 2 via TOPICAL

## 2013-09-11 MED ORDER — MIDAZOLAM HCL 10 MG/2ML IJ SOLN
INTRAMUSCULAR | Status: DC | PRN
  Administered 2013-09-11 (×2): 1 mg via INTRAVENOUS

## 2013-09-11 MED ORDER — MIDAZOLAM HCL 10 MG/2ML IJ SOLN
INTRAMUSCULAR | Status: AC
  Filled 2013-09-11: qty 2

## 2013-09-11 MED ORDER — DIPHENHYDRAMINE HCL 50 MG/ML IJ SOLN
INTRAMUSCULAR | Status: AC
  Filled 2013-09-11: qty 1

## 2013-09-11 MED ORDER — FENTANYL CITRATE 0.05 MG/ML IJ SOLN
INTRAMUSCULAR | Status: DC | PRN
  Administered 2013-09-11 (×2): 12.5 ug via INTRAVENOUS

## 2013-09-11 MED ORDER — FENTANYL CITRATE 0.05 MG/ML IJ SOLN
INTRAMUSCULAR | Status: AC
  Filled 2013-09-11: qty 2

## 2013-09-11 NOTE — Progress Notes (Signed)
[facility] PROGRESS NOTE  BERNEITA SANAGUSTIN QHU:[id number] DOB: 1929/04/20 DOA: 09/09/2013 PCP: Georgetta Haber, MD  Assessment/Plan:  CAP (community acquired pneumonia)  -Continue empiric antibiotics with Rocephin and doxycycline  -Leukocytosis improving, BP soft but stable today  -C. difficile negative and influenza PCR also negative. Follow up in culture  -She has had some aspiration in the past secondary to the GI problems listed below, consult speech therapy for swallow eval, continue aspiration precautions   Sepsis syndrome  -Secondary to #1, patient was tachycardic in EDP, febrile to 103.3, and initially BPs were stable but 86/68 on recheck.  -Continue hydration with IV fluids, as above BP stable this also will continue holding antihypertensives for now  -Improving clinically as above, continue   GERD  -Continue outpatient medications   Gastric outlet obstruction  -s/p stents in the past.  -Followed by GI/Dr. Deatra Ina   Asthma/COPD  -Stable, continue outpatient medications   History of hematemesis  -Followed by Dr. Deatra Ina  -Hemoglobin trending down with hydration, no gross bleeding reported.  -I. have consulted GI further recommendations regarding further anticoagulation.   History of atrial fibrillation-with chronic anticoagulation/Coumadin held since Thursday 2/5  -She is in normal sinus rhythm at this time, when necessary IV metoprolol ordered>> follow.   Hypokalemia  -Replace potassium    Code Status: Full  Family Communication: None at bedside  Disposition Plan: Transfer to telemetry   Consultants:    Procedures:    Antibiotics: Rocephin 2/8>> Doxycycline 2/8>>    HPI/Subjective: [name] is a 80 y.o.WF PMHx    female with multiple medical problems as listed below including her, gastric outlet obstruction status post stents in the past, atrial fibrillation on chronic Coumadin, hematemesis followed by Dr. Deatra Ina, history of  aspiration who presents with above complaints. She states that she has had worsening of her cough in the past one week, along with subjective fevers up. She states that the cough is productive of dark yellowish sputum. Also patient states that she she had diarrhea in the past week-nonbloody, but that she's not had any in the past 2 days. She states that 4-5 days ago she was having some hematemesis again and so went to Dr. Kelby Fam office and was asked to hold off her Coumadin and she's taken it since then. She denies chest pain and no leg swelling. She states she's not had any further hematemesis or diarrhea today. She was seen in the ED  EKG showed normal sinus rhythm at 93, chest x-ray showed new consolidation in the right mid lower lung zone.  She was febrile to 103.3 in ED with white cell count of 21.8, lactic acid normal at 0.93. She started on empiric antibiotics with Rocephin and doxycycline and admitted for further evaluation and management.   Objective: Filed Vitals:   09/11/13 1245 09/11/13 1252 09/11/13 1431 09/11/13 2228  BP: 93/54 92/57 100/53   Pulse:   86   Temp:   98.2 F (36.8 C)   TempSrc:   Oral   Resp: $Remo'18 18 18   'ahgnY$ Height:      Weight:      SpO2: 94% 94% 92% 94%    Intake/Output Summary (Last 24 hours) at 09/11/13 2232 Last data filed at 09/11/13 1829  Gross per 24 hour  Intake   1560 ml  Output      0 ml  Net   1560 ml   Filed Weights   09/09/13 1529 09/10/13 1255  Weight: 55.1 kg (121 lb  7.6 oz) 56.9 kg (125 lb 7.1 oz)    Exam:   General:  A./O. x4, NAD  Cardiovascular: Tachycardic, regular rhythm negative murmurs rubs gallops,  Respiratory: Diffuse rhonchi  Abdomen: Soft, nontender, nondistended, plus bowel sound  Musculoskeletal: Negative pedal edema   Data Reviewed: Basic Metabolic Panel:  Recent Labs Lab 09/09/13 1142 09/09/13 1755 09/10/13 0330 09/11/13 0403  NA 135* 135* 136* 136*  K 3.7 3.5* 3.1* 3.9  CL 95* 97 99 102  CO2 $Re'30 28 30 27   'zqC$ GLUCOSE 137* 124* 100* 106*  BUN $Re'15 15 12 'nke$ 5*  CREATININE 0.71 0.74 0.70 0.64  CALCIUM 8.1* 7.5* 7.1* 7.6*   Liver Function Tests:  Recent Labs Lab 09/09/13 1142  AST 15  ALT 11  ALKPHOS 96  BILITOT 0.8  PROT 5.6*  ALBUMIN 2.5*   No results found for this basename: LIPASE, AMYLASE,  in the last 168 hours No results found for this basename: AMMONIA,  in the last 168 hours CBC:  Recent Labs Lab 09/09/13 1142 09/10/13 0330 09/11/13 0403  WBC 21.8* 14.6* 12.9*  NEUTROABS 19.5*  --   --   HGB 12.1 10.7* 11.1*  HCT 37.3 33.8* 35.9*  MCV 80.0 81.8 82.0  PLT 328 315 320   Cardiac Enzymes: No results found for this basename: CKTOTAL, CKMB, CKMBINDEX, TROPONINI,  in the last 168 hours BNP (last 3 results)  Recent Labs  02/25/13 1108  PROBNP 185.0*   CBG: No results found for this basename: GLUCAP,  in the last 168 hours  Recent Results (from the past 240 hour(s))  URINE CULTURE     Status: None   Collection Time    09/09/13 12:14 PM      Result Value Ref Range Status   Specimen Description URINE, RANDOM   Final   Special Requests NONE   Final   Culture  Setup Time     Final   Value: 09/09/2013 21:01     Performed at [facility]     Final   Value: >=100,000 COLONIES/ML     Performed at [facility]   Culture     Final   Value: ESCHERICHIA COLI     Performed at [facility]   Report Status 09/11/2013 FINAL   Final   Organism ID, Bacteria ESCHERICHIA COLI   Final  RESPIRATORY VIRUS PANEL     Status: None   Collection Time    09/09/13 12:23 PM      Result Value Ref Range Status   Source - RVPAN NASAL SWAB   Corrected   Comment: CORRECTED ON 02/10 AT 1843: PREVIOUSLY REPORTED AS NASAL SWAB   Respiratory Syncytial Virus A NOT DETECTED   Final   Respiratory Syncytial Virus B NOT DETECTED   Final   Influenza A NOT DETECTED   Final   Influenza B NOT DETECTED   Final   Parainfluenza 1 NOT DETECTED   Final   Parainfluenza 2  NOT DETECTED   Final   Parainfluenza 3 NOT DETECTED   Final   Metapneumovirus NOT DETECTED   Final   Rhinovirus NOT DETECTED   Final   Adenovirus NOT DETECTED   Final   Influenza A H1 NOT DETECTED   Final   Influenza A H3 NOT DETECTED   Final   Comment: (NOTE)           Normal Reference Range for each Analyte: NOT DETECTED     Testing  performed using the Luminex xTAG Respiratory Viral Panel test     kit.     This test was developed and its performance characteristics determined     by [facility]. It has not been cleared or approved by the [place]     Food and Drug Administration. This test is used for clinical purposes.     It should not be regarded as investigational or for research. This     laboratory is certified under the [place] (CLIA) as qualified to perform high complexity     clinical laboratory testing.     Performed at [place] PCR SCREENING     Status: None   Collection Time    09/09/13  3:21 PM      Result Value Ref Range Status   MRSA by PCR NEGATIVE  NEGATIVE Final   Comment:            The GeneXpert MRSA Assay (FDA     approved for NASAL specimens     only), is one component of a     comprehensive MRSA colonization     surveillance program. It is not     intended to diagnose MRSA     infection nor to guide or     monitor treatment for     MRSA infections.  CULTURE, BLOOD (ROUTINE X 2)     Status: None   Collection Time    09/09/13  5:55 PM      Result Value Ref Range Status   Specimen Description BLOOD RIGHT ARM   Final   Special Requests BOTTLES DRAWN AEROBIC AND ANAEROBIC 10CC   Final   Culture  Setup Time     Final   Value: 09/10/2013 00:18     Performed at [facility]   Culture     Final   Value:        BLOOD CULTURE RECEIVED NO GROWTH TO DATE CULTURE WILL BE HELD FOR 5 DAYS BEFORE ISSUING A FINAL NEGATIVE REPORT     Performed at [facility]   Report Status PENDING    Incomplete  CULTURE, BLOOD (ROUTINE X 2)     Status: None   Collection Time    09/09/13  6:05 PM      Result Value Ref Range Status   Specimen Description BLOOD RIGHT ARM   Final   Special Requests BOTTLES DRAWN AEROBIC ONLY 1CC   Final   Culture  Setup Time     Final   Value: 09/10/2013 00:19     Performed at [facility]   Culture     Final   Value:        BLOOD CULTURE RECEIVED NO GROWTH TO DATE CULTURE WILL BE HELD FOR 5 DAYS BEFORE ISSUING A FINAL NEGATIVE REPORT     Performed at [facility]   Report Status PENDING   Incomplete  CULTURE, EXPECTORATED SPUTUM-ASSESSMENT     Status: None   Collection Time    09/09/13  6:25 PM      Result Value Ref Range Status   Specimen Description SPUTUM   Final   Special Requests NONE   Final   Sputum evaluation     Final   Value: THIS SPECIMEN IS ACCEPTABLE. RESPIRATORY CULTURE REPORT TO FOLLOW.   Report Status 09/09/2013 FINAL   Final  CULTURE, RESPIRATORY (NON-EXPECTORATED)     Status: None  Collection Time    09/09/13  7:41 PM      Result Value Ref Range Status   Specimen Description SPUTUM   Final   Special Requests NONE   Final   Gram Stain     Final   Value: FEW WBC PRESENT, PREDOMINANTLY PMN     RARE SQUAMOUS EPITHELIAL CELLS PRESENT     FEW GRAM POSITIVE COCCI     IN PAIRS IN CHAINS RARE GRAM NEGATIVE RODS     Performed at [facility]   Culture     Final   Value: NORMAL OROPHARYNGEAL FLORA     Performed at [facility]   Report Status PENDING   Incomplete  CLOSTRIDIUM DIFFICILE BY PCR     Status: None   Collection Time    09/09/13 10:00 PM      Result Value Ref Range Status   C difficile by pcr NEGATIVE  NEGATIVE Final   Comment: Performed at [facility]     Status: None   Collection Time    09/09/13 10:00 PM      Result Value Ref Range Status   Specimen Description STOOL   Final   Special Requests NONE   Final   Culture     Final   Value: NO SUSPICIOUS  COLONIES, CONTINUING TO HOLD     Performed at [facility]   Report Status PENDING   Incomplete     Studies: No results found.  Scheduled Meds: . arformoterol  15 mcg Nebulization BID  . B-complex with vitamin C  1 tablet Oral BID  . budesonide  0.25 mg Nebulization BID  . calcium-vitamin D  1 tablet Oral TID  . cefTRIAXone (ROCEPHIN)  IV  1 g Intravenous Q24H  . digoxin  0.125 mg Oral Daily  . doxycycline  100 mg Oral Q12H  . lansoprazole  60 mg Oral BID  . metoCLOPramide  5 mg Oral BID  . sucralfate  1 g Oral BID   Continuous Infusions: . sodium chloride 100 mL/hr at 09/11/13 1345    Active Problems:   GERD   Gastric outlet obstruction   CAP (community acquired pneumonia)   Sepsis   PNA (pneumonia)   A-fib   Unspecified asthma(493.90)   Hematemesis   Acute blood loss anemia    Time spent: 40 minute   Deyana Wnuk, [place], J  [facility] Pager [phone]. If 7PM-7AM, please contact night-coverage at [email or web address], password [facility] 09/11/2013, 10:32 PM  LOS: 2 days

## 2013-09-11 NOTE — Op Note (Signed)
[facility] [place], [address]   ENDOSCOPY PROCEDURE REPORT  PATIENT: Erica, [name]  MR#: [id number] BIRTHDATE: 10/28/1928 , 84  yrs. old GENDER: Female ENDOSCOPIST: Gatha Mayer, MD, [facility] PROCEDURE DATE:  09/11/2013 PROCEDURE:  EGD, diagnostic ASA CLASS:     Class III INDICATIONS:  Hematemesis. MEDICATIONS: Fentanyl 25 mcg IV and Versed 2 mg IV TOPICAL ANESTHETIC: Cetacaine Spray  DESCRIPTION OF PROCEDURE: After the risks benefits and alternatives of the procedure were thoroughly explained, informed consent was obtained.  The Pentax Gastroscope [id number] endoscope was introduced through the mouth and advanced to the second portion of the duodenum. Without limitations.  The instrument was slowly withdrawn as the mucosa was fully examined.        ESOPHAGUS: An anastomosis was found 18 cm from the incisors.  STOMACH: Covered Metal stent was found in the gastric antrum - 2 stents stacked extending into duodenum.  DUODENUM: The duodenal mucosa showed some minor heme and typical mucosal hyperplasia from stent border  at stent edge but  no other abnormalities.  Retroflexion was not performed.     The scope was then withdrawn from the patient and the procedure completed.  COMPLICATIONS: There were no complications. ENDOSCOPIC IMPRESSION: 1.   Esophago-gastric anastomosis 18 cm from the incisors - normal 2.   Stents were found in the gastric antrum -duodenum with  typical minor mucosal changes at distal stent margin 3.   The duodenal mucosa showed no other abnormalities  RECOMMENDATIONS: No signs of major bleeding here though distal aspect of stent is causing minor irritation and mucosal disruption in duodenum.  i susppose this could have been associated with hematemesis when she was on warfarin and INR could have even been high?  OK to retry warfarin with close INR f/u from my standpoint. Call if ? - signing off.    eSigned:   Gatha Mayer, MD, [facility] 09/11/2013 12:23 PM

## 2013-09-12 ENCOUNTER — Encounter (HOSPITAL_COMMUNITY): Payer: Self-pay | Admitting: Internal Medicine

## 2013-09-12 ENCOUNTER — Inpatient Hospital Stay (HOSPITAL_COMMUNITY): Payer: Medicare Other

## 2013-09-12 DIAGNOSIS — J441 Chronic obstructive pulmonary disease with (acute) exacerbation: Secondary | ICD-10-CM

## 2013-09-12 LAB — CULTURE, RESPIRATORY: CULTURE: NORMAL

## 2013-09-12 LAB — CULTURE, RESPIRATORY W GRAM STAIN

## 2013-09-12 MED ORDER — IOHEXOL 300 MG/ML  SOLN
80.0000 mL | Freq: Once | INTRAMUSCULAR | Status: AC | PRN
  Administered 2013-09-12: 80 mL via INTRAVENOUS

## 2013-09-12 MED ORDER — SODIUM CHLORIDE 0.9 % IV SOLN
INTRAVENOUS | Status: DC
  Administered 2013-09-13: via INTRAVENOUS

## 2013-09-12 MED ORDER — ENOXAPARIN SODIUM 60 MG/0.6ML ~~LOC~~ SOLN
1.0000 mg/kg | Freq: Two times a day (BID) | SUBCUTANEOUS | Status: DC
  Administered 2013-09-13 – 2013-09-17 (×10): 55 mg via SUBCUTANEOUS
  Filled 2013-09-12 (×11): qty 0.6

## 2013-09-12 MED ORDER — METHYLPREDNISOLONE SODIUM SUCC 125 MG IJ SOLR
60.0000 mg | Freq: Once | INTRAMUSCULAR | Status: AC
  Administered 2013-09-12: 60 mg via INTRAVENOUS
  Filled 2013-09-12: qty 0.96

## 2013-09-12 MED ORDER — WARFARIN SODIUM 6 MG PO TABS
6.0000 mg | ORAL_TABLET | Freq: Once | ORAL | Status: AC
  Administered 2013-09-13: 6 mg via ORAL
  Filled 2013-09-12: qty 1

## 2013-09-12 MED ORDER — WARFARIN - PHARMACIST DOSING INPATIENT: Freq: Every day | Status: DC

## 2013-09-12 MED ORDER — DM-GUAIFENESIN ER 30-600 MG PO TB12
1.0000 | ORAL_TABLET | Freq: Every morning | ORAL | Status: DC
  Administered 2013-09-13 – 2013-09-17 (×5): 1 via ORAL
  Filled 2013-09-12 (×5): qty 1

## 2013-09-12 MED ORDER — GUAIFENESIN-CODEINE 100-10 MG/5ML PO SOLN
10.0000 mL | Freq: Every day | ORAL | Status: DC
  Administered 2013-09-13 – 2013-09-16 (×5): 10 mL via ORAL
  Filled 2013-09-12 (×5): qty 10

## 2013-09-12 NOTE — Progress Notes (Signed)
Spoke with Dr. Sherral Hammers concerning increased edema in lower arms and legs, order received to decrease IVF. SRP, RN

## 2013-09-12 NOTE — Telephone Encounter (Signed)
Talked with niece and explained to her about the hospitalist seeing her at the [facility]

## 2013-09-12 NOTE — Evaluation (Signed)
Physical Therapy Evaluation Patient Details Name: [name] MRN: [id number] DOB: 06/25/1929 Today's Date: 09/12/2013 Time: 7680-8811 PT Time Calculation (min): 14 min  PT Assessment / Plan / Recommendation History of Present Illness  Pt is an 80 year old female with multiple medical problems as listed below including her, gastric outlet obstruction status post stents in the past, atrial fibrillation on chronic Coumadin, hematemesis followed by Dr. Deatra Ina, history of aspiration who presents with above complaints. She states that she has had worsening of her cough in the past one week, along with subjective fevers up. She states that the cough is productive of dark yellowish sputum. Also patient states that she she had diarrhea in the past week-nonbloody, but that she's not had any in the past 2 days. She states that 4-5 days ago she was having some hematemesis again and so went to Dr. Kelby Fam office and was asked to hold off her Coumadin and she's taken it since then. She denies chest pain and no leg swelling. She states she's not had any further hematemesis or diarrhea today. She was seen in the ED and admitted for CAP.  Clinical Impression  Pt admitted with above. Pt currently with functional limitations due to the deficits listed below (see PT Problem List).  Pt will benefit from skilled PT to increase their independence and safety with mobility to allow discharge to the venue listed below.  Pt from home alone and usually modified independent.  Niece reports pt has fallen outside home recently (no injuries).  Pt would benefit from ST-SNF prior to return home alone.     PT Assessment  Patient needs continued PT services    Follow Up Recommendations  SNF    Does the patient have the potential to tolerate intense rehabilitation      Barriers to Discharge        Equipment Recommendations  None recommended by PT    Recommendations for Other Services     Frequency Min 3X/week     Precautions / Restrictions Precautions Precautions: Fall   Pertinent Vitals/Pain No complaints, pt agreeable to ambulate      Mobility  Transfers Overall transfer level: Needs assistance Equipment used: Rolling walker (2 wheeled) Transfers: Stand Pivot Transfers;Sit to/from Stand Sit to Stand: Min guard Stand pivot transfers: Min guard General transfer comment: verbal cues for hand placement Ambulation/Gait Ambulation/Gait assistance: Min guard Ambulation Distance (Feet): 160 Feet Assistive device: Rolling walker (2 wheeled) Gait Pattern/deviations: Step-through pattern Gait velocity: decr General Gait Details: variable speeds, pt reports she ambulates "better" faster pace, HR 105 and SpO2 97% RA after return to recliner    Exercises     PT Diagnosis: Difficulty walking;Generalized weakness  PT Problem List: Decreased strength;Decreased activity tolerance;Decreased balance;Decreased mobility;Decreased safety awareness PT Treatment Interventions: DME instruction;Gait training;Functional mobility training;Patient/family education;Therapeutic activities;Therapeutic exercise;Balance training;Neuromuscular re-education     PT Goals(Current goals can be found in the care plan section) Acute Rehab PT Goals PT Goal Formulation: With patient/family Time For Goal Achievement: 09/19/13 Potential to Achieve Goals: Good  Visit Information  Last PT Received On: 09/12/13 Assistance Needed: +1 History of Present Illness: Pt is an 80 year old female with multiple medical problems as listed below including her, gastric outlet obstruction status post stents in the past, atrial fibrillation on chronic Coumadin, hematemesis followed by Dr. Deatra Ina, history of aspiration who presents with above complaints. She states that she has had worsening of her cough in the past one week, along with subjective fevers up.  She states that the cough is productive of dark yellowish sputum. Also patient states  that she she had diarrhea in the past week-nonbloody, but that she's not had any in the past 2 days. She states that 4-5 days ago she was having some hematemesis again and so went to Dr. Kelby Fam office and was asked to hold off her Coumadin and she's taken it since then. She denies chest pain and no leg swelling. She states she's not had any further hematemesis or diarrhea today. She was seen in the ED and admitted for CAP.       Prior [place] expects to be discharged to:: Private residence Living Arrangements: Alone Type of Home: House Home Access: Stairs to enter [place]: One level Home Equipment: Environmental consultant - 2 wheels Prior Function Level of Independence: Independent with assistive device(s) Communication Communication: No difficulties    Cognition  Cognition Arousal/Alertness: Awake/alert Behavior During Therapy: WFL for tasks assessed/performed Overall Cognitive Status: Within Functional Limits for tasks assessed    Extremity/Trunk Assessment Lower Extremity Assessment Lower Extremity Assessment: Generalized weakness Cervical / Trunk Assessment Cervical / Trunk Assessment: Other exceptions Cervical / Trunk Exceptions: neck contracture tilted L    Balance    End of Session PT - End of Session Activity Tolerance: Patient tolerated treatment well Patient left: in chair;with call bell/phone within reach;with nursing/sitter in room  GP     Shila Kruczek,KATHrine E 09/12/2013, 11:38 AM Carmelia Bake, PT, DPT 09/12/2013 Pager: [phone]

## 2013-09-12 NOTE — Progress Notes (Signed)
Pt heart rate is 157--- last 30 minutes--- CMT called-- Lopressor 5 mg given as ordered. SRP, RN

## 2013-09-12 NOTE — Progress Notes (Signed)
SLP spoke to MD re: pt's hx of esophageal cancer s/p stomach pull up 1993, s/p duodenal stent placement in 2013 and 2014 with dysphagia and GERD.  Aspiration risk not due to a oropharyngeal deficit but known esoph/GI issues that pt is managing as best able in this SLPs opinion.   Dr Sherral Hammers agreed for Speech swallow evaluation to be cancelled.   Thanks. Luanna Salk, [facility] SLP [phone]

## 2013-09-12 NOTE — Progress Notes (Signed)
Clinical Social Work Department CLINICAL SOCIAL WORK PLACEMENT NOTE 09/12/2013  Patient:  [name], [name]  Account Number:  [id number] Admit date:  09/09/2013  Clinical Social Worker:  Renold Genta  Date/time:  09/12/2013 11:42 AM  Clinical Social Work is seeking post-discharge placement for this patient at the following level of care:   SKILLED NURSING   (*CSW will update this form in Epic as items are completed)   09/12/2013  Patient/family provided with [place] Department of Clinical Social Work's list of facilities offering this level of care within the geographic area requested by the patient (or if unable, by the patient's family).  09/12/2013  Patient/family informed of their freedom to choose among providers that offer the needed level of care, that participate in Medicare, Medicaid or managed care program needed by the patient, have an available bed and are willing to accept the patient.  09/12/2013  Patient/family informed of [facility]' ownership interest in [facility], as well as of the fact that they are under no obligation to receive care at this facility.  PASARR submitted to EDS on 09/12/2013 PASARR number received from EDS on 09/12/2013  FL2 transmitted to all facilities in geographic area requested by pt/family on  09/12/2013 FL2 transmitted to all facilities within larger geographic area on   Patient informed that his/her managed care company has contracts with or will negotiate with  certain facilities, including the following:     Patient/family informed of bed offers received:   Patient chooses bed at  Physician recommends and patient chooses bed at    Patient to be transferred to  on   Patient to be transferred to facility by   The following physician request were entered in Epic:   Additional Comments:   Raynaldo Opitz, [place] Social Worker cell #: [phone]

## 2013-09-12 NOTE — Progress Notes (Signed)
Clinical Social Work Department BRIEF PSYCHOSOCIAL ASSESSMENT 09/12/2013  Patient:  [name], [name]     Account Number:  [id number]     Admit date:  09/09/2013  Clinical Social Worker:  Renold Genta  Date/Time:  09/12/2013 11:29 AM  Referred by:  Physician  Date Referred:  09/12/2013 Referred for  SNF Placement   Other Referral:   Interview type:  Patient Other interview type:   and family at bedside    PSYCHOSOCIAL DATA Living Status:  ALONE Admitted from facility:   Level of care:   Primary support name:  [name] (family) ph#: [phone], [phone] Primary support relationship to patient:  FAMILY Degree of support available:   good    CURRENT CONCERNS Current Concerns  Post-Acute Placement   Other Concerns:    SOCIAL WORK ASSESSMENT / PLAN CSW received consult for SNF placement, note PT recommended SNF for patient at discharge.   Assessment/plan status:  Information/Referral to [employer] Other assessment/ plan:   Information/referral to community resources:   CSW completed FL2 and faxed information out to [facility].    PATIENT'S/FAMILY'S RESPONSE TO PLAN OF CARE: Patient states that she has been to [facility] in the past and would prefer to go there again. CSW spoke with [name] @ [facility] who states that she will review patient's clinical information & get back to CSW.       Raynaldo Opitz, [facility] Clinical Social Worker cell #: [phone]

## 2013-09-12 NOTE — Progress Notes (Signed)
ANTICOAGULATION CONSULT NOTE - Initial Consult  Pharmacy Consult for Lovenox and Coumadin Indication: atrial fibrillation  Allergies  Allergen Reactions  . Erythromycin Ethylsuccinate     Irregular pulse rate  . Prednisone     "makes me hyper"    Patient Measurements: Height: 5' (152.4 cm) Weight: 125 lb 7.1 oz (56.9 kg) IBW/kg (Calculated) : 45.5  Vital Signs: Temp: 98.3 F (36.8 C) (02/12 1303) Temp src: Oral (02/12 1303) BP: 102/69 mmHg (02/12 1303) Pulse Rate: 78 (02/12 1303)  Labs:  Recent Labs  09/10/13 0330 09/11/13 0403  HGB 10.7* 11.1*  HCT 33.8* 35.9*  PLT 315 320  CREATININE 0.70 0.64    Estimated Creatinine Clearance: 42.9 ml/min (by C-G formula based on Cr of 0.64).   Medical History: Past Medical History  Diagnosis Date  . GERD (gastroesophageal reflux disease)   . Osteoporosis   . Postmenopausal HRT (hormone replacement therapy)   . Anemia   . Hypertension   . Allergy   . Asthma   . Shortness of breath   . PONV (postoperative nausea and vomiting)   . Pneumonia      Hx of pneumonia,12months ago  . Dysrhythmia     a fib  . Headache(784.0)   . Esophageal cancer     removed esophagus stomach pulled up    Medications:  Prescriptions prior to admission  Medication Sig Dispense Refill  . acetaminophen (TYLENOL) 500 MG tablet Take 500 mg by mouth daily as needed for headache.      . albuterol (PROVENTIL HFA;VENTOLIN HFA) 108 (90 BASE) MCG/ACT inhaler Inhale 2 puffs into the lungs every 6 (six) hours as needed. For wheezing  1 Inhaler  11  . arformoterol (BROVANA) 15 MCG/2ML NEBU Take 2 mLs (15 mcg total) by nebulization 2 (two) times daily. Dx: 493.00  120 mL  12  . atenolol-chlorthalidone (TENORETIC) 50-25 MG per tablet Take 1 tablet by mouth daily.      . budesonide (PULMICORT) 0.25 MG/2ML nebulizer solution Take 2 mLs (0.25 mg total) by nebulization 2 (two) times daily. Dx: 493.00  120 mL  12  . Calcium-Vitamin D-Vitamin K (VIACTIV)  [phone] MG-UNT-MCG CHEW Chew by mouth 3 (three) times daily.        . chlorpheniramine-HYDROcodone (TUSSIONEX PENNKINETIC ER) 10-8 MG/5ML LQCR Take 5 mLs by mouth every 12 (twelve) hours as needed for cough (sedation caution).  120 mL  0  . digoxin (LANOXIN) 0.125 MG tablet Take 1 tablet (0.125 mg total) by mouth daily.  90 tablet  3  . Fe Fum-FA-B Cmp-C-Zn-Mg-Mn-Cu (HEMOCYTE PLUS) 106-1 MG CAPS Take 106 mg by mouth 2 (two) times daily.  180 each  3  . lansoprazole (PREVACID SOLUTAB) 30 MG disintegrating tablet Take 2 tablets (60 mg total) by mouth 2 (two) times daily.  360 tablet  3  . levocetirizine (XYZAL) 5 MG tablet Take 5 mg by mouth every evening. Patient takes only as needed      . metoCLOPramide (REGLAN) 5 MG tablet Take 5 mg by mouth 2 (two) times daily.      . mometasone-formoterol (DULERA) 100-5 MCG/ACT AERO Take 2 puffs first thing in am and then another 2 puffs about 12 hours later.  1 Inhaler  11  . Olopatadine HCl (PATADAY) 0.2 % SOLN Apply 1-2 drops to eye 2 (two) times daily as needed (dry, itchy eyes.).      Marland Kitchen phenol (CHLORASEPTIC) 1.4 % LIQD Use as directed 1 spray in the mouth or throat as  needed for throat irritation / pain.      . promethazine (PHENERGAN) 25 MG suppository Place 25 mg rectally every 6 (six) hours as needed for nausea or vomiting.      . sucralfate (CARAFATE) 1 GM/10ML suspension Take 10 mLs (1 g total) by mouth 2 (two) times daily.  1800 mL  3  . traMADol (ULTRAM) 50 MG tablet Take 50 mg by mouth every 6 (six) hours as needed for moderate pain.      . Vitamin D, Ergocalciferol, (DRISDOL) 50000 UNITS CAPS Take 1 capsule (50,000 Units total) by mouth every 7 (seven) days. On Friday  30 capsule  3  . warfarin (COUMADIN) 5 MG tablet Take 5 mg by mouth daily.       Marland Kitchen zolpidem (AMBIEN) 5 MG tablet Take 1 tablet (5 mg total) by mouth at bedtime as needed for sleep.  90 tablet  1   Scheduled:  . arformoterol  15 mcg Nebulization BID  . B-complex with vitamin C  1  tablet Oral BID  . budesonide  0.25 mg Nebulization BID  . calcium-vitamin D  1 tablet Oral TID  . cefTRIAXone (ROCEPHIN)  IV  1 g Intravenous Q24H  . [START ON 09/13/2013] dextromethorphan-guaiFENesin  1 tablet Oral q morning - 10a  . digoxin  0.125 mg Oral Daily  . doxycycline  100 mg Oral Q12H  . guaiFENesin-codeine  10 mL Oral QHS  . lansoprazole  60 mg Oral BID  . metoCLOPramide  5 mg Oral BID  . sucralfate  1 g Oral BID    Assessment: 80yo F on chronic Coumadin for A.fib was admitted 2/9 with fever, productive cough, and diarrhea. On 2/5 Coumadin was held d/t hematemesis. EGD on 2/11 showed no gross bleeding. Pharmacy is asked to resume anticoagulation with Lovenox bridging to Coumadin.   Last INR was 1.22 on 2/9.  CBC ok on 2/11. No bleeding reported/documented.  SCr is wnl, CrCl ~64ml/min.  Doxycycline and Rocephin can increase sensitivity to Coumadin.  Goal of Therapy:  INR 2-3 Monitor platelets by anticoagulation protocol: Yes   Plan:   Lovenox 1mg /kg SQ q12h.  Coumadin 6mg  tonight.  Check PT/INR daily and CBC q48h.  Will need close f/u of INR if DC'd soon.  Romeo Rabon, PharmD, pager [phone]. 09/12/2013,8:06 PM.

## 2013-09-12 NOTE — Discharge Summary (Signed)
Physician Discharge Summary  [name] ZOX:[id number] DOB: May 18, 1929 DOA: 09/09/2013  PCP: Georgetta Haber, MD  Admit date: 09/09/2013 Discharge date: 09/12/2013  Time spent: 40 minutes  Recommendations for Outpatient Follow-up:   CAP (community acquired pneumonia)  -Continue empiric antibiotics with Rocephin and doxycycline  -Leukocytosis improving, BP soft but stable today  -C. difficile negative and influenza PCR also negative. Follow up in culture  -She has had some aspiration in the past secondary to the GI problems listed below. -consult speech therapy for swallow eval (pending), continue aspiration precautions  -2/12 Obtain chest CT with contrast rule out neoplasm vs aspiration pneumonia  Sepsis syndrome  -Secondary to CAP -Continue hydration with IV fluids,  -continue holding antihypertensives for now  -Improving clinically   GERD  -Continue outpatient medications   Gastric outlet obstruction  -s/p stents in the past.  -Followed by GI/Dr. Deatra Ina -S/P EGD 2/11 by: See results below   Asthma/COPD  -Stable, continue outpatient medications  -Solu-Medrol 60 mg x1 if patient tolerates will schedule  History of hematemesis  -Followed by Dr. Deatra Ina  -Hemoglobin trending down with hydration, no gross bleeding reported.  -Per GI okay to restart anticoagulation.; -09/06/2013 INR 3.9    History of atrial fibrillation -with chronic anticoagulation/Coumadin held since Thursday 2/5  -No bleeding source found with EGD, hemoglobin stable.  Restart Lovenox per pharmacy; Coumadin per pharmacy -She is in normal sinus rhythm at this time, when necessary IV metoprolol ordered>> follow.   Hypokalemia  -Replace potassium     Discharge Diagnoses:  Active Problems:   GERD   Gastric outlet obstruction   CAP (community acquired pneumonia)   Sepsis   PNA (pneumonia)   A-fib   Unspecified asthma(493.90)   Hematemesis   Acute blood loss anemia   Discharge Condition:  Stable  Diet recommendation: Heart healthy  Filed Weights   09/09/13 1529 09/10/13 1255  Weight: 55.1 kg (121 lb 7.6 oz) 56.9 kg (125 lb 7.1 oz)    History of present illness:  [name] is a 80 y.o.WF PMHx  female with multiple medical problems as listed below including her, gastric outlet obstruction status post stents in the past, atrial fibrillation on chronic Coumadin, hematemesis followed by Dr. Deatra Ina, history of aspiration who presents with above complaints. She states that she has had worsening of her cough in the past one week, along with subjective fevers up. She states that the cough is productive of dark yellowish sputum. Also patient states that she she had diarrhea in the past week-nonbloody, but that she's not had any in the past 2 days. She states that 4-5 days ago she was having some hematemesis again and so went to Dr. Kelby Fam office and was asked to hold off her Coumadin and she's taken it since then. She denies chest pain and no leg swelling. She states she's not had any further hematemesis or diarrhea today. She was seen in the ED  EKG showed normal sinus rhythm at 93, chest x-ray showed new consolidation in the right mid lower lung zone. She was febrile to 103.3 in ED with white cell count of 21.8, lactic acid normal at 0.93. She started on empiric antibiotics with Rocephin and doxycycline and admitted for further evaluation and management. 2/12 spoke with Dr. Silvano Rusk (GI) and he confirmed that there was a little bit of bleeding around the gastric stents but no new bleeding. Patient currently has been ambulating around ward with decreased DOE. States still feels weak/fatigue.  Consultants:   Dr. Silvano Rusk (GI)   Procedures:  EGD 09/11/2013 by Dr. Silvano Rusk (GI) ENDOSCOPIC IMPRESSION:  1. Esophago-gastric anastomosis 18 cm from the incisors - normal  2. Stents were found in the gastric antrum -duodenum with typical  minor mucosal changes at  distal stent margin  3. The duodenal mucosa showed no other abnormalities  CXR 09/09/2013 Post gastric pull-up with stent in place.  Cardiomegaly.  Calcified tortuous aorta.  Consolidation peripheral aspect right mid-lower lung zone. This is  new from the prior examination and may represent infiltrate or  atelectasis. Underlying mass not excluded. Follow-up until clearance  recommended.  Limited evaluation of the retrocardiac region secondary to the  gastric pull up and adjacent atelectasis/scarring.    Antibiotics:  Rocephin 2/8>>  Doxycycline 2/8>>    Discharge Exam: Filed Vitals:   09/11/13 2320 09/12/13 0622 09/12/13 1053 09/12/13 1303  BP: 120/63 98/65  102/69  Pulse: 87 105  78  Temp: 98 F (36.7 C) 98.5 F (36.9 C)  98.3 F (36.8 C)  TempSrc: Oral Oral  Oral  Resp: $Remo'20 20  20  'gPGmu$ Height:      Weight:      SpO2: 93% 93% 96% 95%   General: A./O. x4, NAD  Cardiovascular: Tachycardic, regular rhythm negative murmurs rubs gallops,  Respiratory: Diffuse rhonchi with expiratory wheezing, improved from 2/11 Abdomen: Soft, nontender, nondistended, plus bowel sound  Musculoskeletal: Negative pedal edema   Discharge Instructions   Future Appointments Provider Department Dept Phone   09/27/2013 11:00 AM [facility] [place] [phone]   10/08/2013 1:45 PM Inda Castle, MD [facility] [phone]   11/11/2013 11:45 AM Ricard Dillon, MD [facility] [place]       Medication List    ASK your doctor about these medications       acetaminophen 500 MG tablet  Commonly known as:  TYLENOL  Take 500 mg by mouth daily as needed for headache.     albuterol 108 (90 BASE) MCG/ACT inhaler  Commonly known as:  PROVENTIL HFA;VENTOLIN HFA  Inhale 2 puffs into the lungs every 6 (six) hours as needed. For wheezing     arformoterol 15 MCG/2ML Nebu  Commonly known as:  BROVANA  Take 2 mLs  (15 mcg total) by nebulization 2 (two) times daily. Dx: 493.00     atenolol-chlorthalidone 50-25 MG per tablet  Commonly known as:  TENORETIC  Take 1 tablet by mouth daily.     budesonide 0.25 MG/2ML nebulizer solution  Commonly known as:  PULMICORT  Take 2 mLs (0.25 mg total) by nebulization 2 (two) times daily. Dx: 493.00     chlorpheniramine-HYDROcodone 10-8 MG/5ML Lqcr  Commonly known as:  TUSSIONEX PENNKINETIC ER  Take 5 mLs by mouth every 12 (twelve) hours as needed for cough (sedation caution).     digoxin 0.125 MG tablet  Commonly known as:  LANOXIN  Take 1 tablet (0.125 mg total) by mouth daily.     HEMOCYTE PLUS 106-1 MG Caps  Take 106 mg by mouth 2 (two) times daily.     lansoprazole 30 MG disintegrating tablet  Commonly known as:  PREVACID SOLUTAB  Take 2 tablets (60 mg total) by mouth 2 (two) times daily.     levocetirizine 5 MG tablet  Commonly known as:  XYZAL  Take 5 mg by mouth every evening. Patient takes only as needed     metoCLOPramide 5 MG tablet  Commonly  known as:  REGLAN  Take 5 mg by mouth 2 (two) times daily.     mometasone-formoterol 100-5 MCG/ACT Aero  Commonly known as:  DULERA  Take 2 puffs first thing in am and then another 2 puffs about 12 hours later.     PATADAY 0.2 % Soln  Generic drug:  Olopatadine HCl  Apply 1-2 drops to eye 2 (two) times daily as needed (dry, itchy eyes.).     phenol 1.4 % Liqd  Commonly known as:  CHLORASEPTIC  Use as directed 1 spray in the mouth or throat as needed for throat irritation / pain.     promethazine 25 MG suppository  Commonly known as:  PHENERGAN  Place 25 mg rectally every 6 (six) hours as needed for nausea or vomiting.     sucralfate 1 GM/10ML suspension  Commonly known as:  CARAFATE  Take 10 mLs (1 g total) by mouth 2 (two) times daily.     traMADol 50 MG tablet  Commonly known as:  ULTRAM  Take 50 mg by mouth every 6 (six) hours as needed for moderate pain.     VIACTIV [phone]  MG-UNT-MCG Chew  Generic drug:  Calcium-Vitamin D-Vitamin K  Chew by mouth 3 (three) times daily.     Vitamin D (Ergocalciferol) 50000 UNITS Caps capsule  Commonly known as:  DRISDOL  Take 1 capsule (50,000 Units total) by mouth every 7 (seven) days. On Friday     warfarin 5 MG tablet  Commonly known as:  COUMADIN  Take 5 mg by mouth daily.     zolpidem 5 MG tablet  Commonly known as:  AMBIEN  Take 1 tablet (5 mg total) by mouth at bedtime as needed for sleep.       Allergies  Allergen Reactions  . Erythromycin Ethylsuccinate     Irregular pulse rate  . Prednisone     "makes me hyper"      The results of significant diagnostics from this hospitalization (including imaging, microbiology, ancillary and laboratory) are listed below for reference.    Significant Diagnostic Studies: Dg Chest 2 View  09/09/2013   CLINICAL DATA:  Chest discomfort. Weakness. Shortness breath. History of asthma and high blood pressure  EXAM: CHEST  2 VIEW  COMPARISON:  07/31/2013 chest x-ray.  01/15/2013 chest CT.  FINDINGS: Post gastric pull-up with stent in place.  Cardiomegaly.  Calcified tortuous aorta.  Consolidation peripheral aspect right mid-lower lung zone. This is new from the prior examination and may represent infiltrate or atelectasis. Underlying mass not excluded. Follow-up until clearance recommended.  Limited evaluation of the retrocardiac region secondary to the gastric pull up and adjacent atelectasis/scarring.  Degenerative changes mid thoracic spine.  IMPRESSION: Post gastric pull-up with stent in place.  Cardiomegaly.  Calcified tortuous aorta.  Consolidation peripheral aspect right mid-lower lung zone. This is new from the prior examination and may represent infiltrate or atelectasis. Underlying mass not excluded. Follow-up until clearance recommended.  Limited evaluation of the retrocardiac region secondary to the gastric pull up and adjacent atelectasis/scarring.   Electronically Signed    By: Chauncey Cruel M.D.   On: 09/09/2013 13:17    Microbiology: Recent Results (from the past 240 hour(s))  URINE CULTURE     Status: None   Collection Time    09/09/13 12:14 PM      Result Value Ref Range Status   Specimen Description URINE, RANDOM   Final   Special Requests NONE   Final   Culture  Setup Time     Final   Value: 09/09/2013 21:01     Performed at [place]     Final   Value: >=100,000 COLONIES/ML     Performed at [facility]   Culture     Final   Value: ESCHERICHIA COLI     Performed at [facility]   Report Status 09/11/2013 FINAL   Final   Organism ID, Bacteria ESCHERICHIA COLI   Final  RESPIRATORY VIRUS PANEL     Status: None   Collection Time    09/09/13 12:23 PM      Result Value Ref Range Status   Source - [facility] NASAL SWAB   Corrected   Comment: CORRECTED ON 02/10 AT 1843: PREVIOUSLY REPORTED AS NASAL SWAB   Respiratory Syncytial Virus A NOT DETECTED   Final   Respiratory Syncytial Virus B NOT DETECTED   Final   Influenza A NOT DETECTED   Final   Influenza B NOT DETECTED   Final   Parainfluenza 1 NOT DETECTED   Final   Parainfluenza 2 NOT DETECTED   Final   Parainfluenza 3 NOT DETECTED   Final   Metapneumovirus NOT DETECTED   Final   Rhinovirus NOT DETECTED   Final   Adenovirus NOT DETECTED   Final   Influenza A H1 NOT DETECTED   Final   Influenza A H3 NOT DETECTED   Final   Comment: (NOTE)           Normal Reference Range for each Analyte: NOT DETECTED     Testing performed using the Luminex xTAG Respiratory Viral Panel test     kit.     This test was developed and its performance characteristics determined     by [facility]. It has not been cleared or approved by the [place]     Food and Drug Administration. This test is used for clinical purposes.     It should not be regarded as investigational or for research. This     laboratory is certified under the [place] (CLIA) as qualified to perform high complexity     clinical laboratory testing.     Performed at [facility] PCR SCREENING     Status: None   Collection Time    09/09/13  3:21 PM      Result Value Ref Range Status   MRSA by PCR NEGATIVE  NEGATIVE Final   Comment:            The GeneXpert MRSA Assay (FDA     approved for NASAL specimens     only), is one component of a     comprehensive MRSA colonization     surveillance program. It is not     intended to diagnose MRSA     infection nor to guide or     monitor treatment for     MRSA infections.  CULTURE, BLOOD (ROUTINE X 2)     Status: None   Collection Time    09/09/13  5:55 PM      Result Value Ref Range Status   Specimen Description BLOOD RIGHT ARM   Final   Special Requests BOTTLES DRAWN AEROBIC AND ANAEROBIC 10CC   Final   Culture  Setup Time     Final   Value: 09/10/2013 00:18     Performed at [facility]  Culture     Final   Value:        BLOOD CULTURE RECEIVED NO GROWTH TO DATE CULTURE WILL BE HELD FOR 5 DAYS BEFORE ISSUING A FINAL NEGATIVE REPORT     Performed at [facility]   Report Status PENDING   Incomplete  CULTURE, BLOOD (ROUTINE X 2)     Status: None   Collection Time    09/09/13  6:05 PM      Result Value Ref Range Status   Specimen Description BLOOD RIGHT ARM   Final   Special Requests BOTTLES DRAWN AEROBIC ONLY 1CC   Final   Culture  Setup Time     Final   Value: 09/10/2013 00:19     Performed at [facility]   Culture     Final   Value:        BLOOD CULTURE RECEIVED NO GROWTH TO DATE CULTURE WILL BE HELD FOR 5 DAYS BEFORE ISSUING A FINAL NEGATIVE REPORT     Performed at [facility]   Report Status PENDING   Incomplete  CULTURE, EXPECTORATED SPUTUM-ASSESSMENT     Status: None   Collection Time    09/09/13  6:25 PM      Result Value Ref Range Status   Specimen Description SPUTUM   Final   Special Requests NONE   Final   Sputum  evaluation     Final   Value: THIS SPECIMEN IS ACCEPTABLE. RESPIRATORY CULTURE REPORT TO FOLLOW.   Report Status 09/09/2013 FINAL   Final  CULTURE, RESPIRATORY (NON-EXPECTORATED)     Status: None   Collection Time    09/09/13  7:41 PM      Result Value Ref Range Status   Specimen Description SPUTUM   Final   Special Requests NONE   Final   Gram Stain     Final   Value: FEW WBC PRESENT, PREDOMINANTLY PMN     RARE SQUAMOUS EPITHELIAL CELLS PRESENT     FEW GRAM POSITIVE COCCI     IN PAIRS IN CHAINS RARE GRAM NEGATIVE RODS     Performed at [facility]   Culture     Final   Value: NORMAL OROPHARYNGEAL FLORA     Performed at [facility]   Report Status 09/12/2013 FINAL   Final  CLOSTRIDIUM DIFFICILE BY PCR     Status: None   Collection Time    09/09/13 10:00 PM      Result Value Ref Range Status   C difficile by pcr NEGATIVE  NEGATIVE Final   Comment: Performed at [place]     Status: None   Collection Time    09/09/13 10:00 PM      Result Value Ref Range Status   Specimen Description STOOL   Final   Special Requests NONE   Final   Culture     Final   Value: NO SUSPICIOUS COLONIES, CONTINUING TO HOLD     Performed at [facility]   Report Status PENDING   Incomplete     Labs: Basic Metabolic Panel:  Recent Labs Lab 09/09/13 1142 09/09/13 1755 09/10/13 0330 09/11/13 0403  NA 135* 135* 136* 136*  K 3.7 3.5* 3.1* 3.9  CL 95* 97 99 102  CO2 $Re'30 28 30 27  'JkR$ GLUCOSE 137* 124* 100* 106*  BUN $Re'15 15 12 'BqV$ 5*  CREATININE 0.71 0.74 0.70 0.64  CALCIUM 8.1* 7.5* 7.1* 7.6*   Liver Function  Tests:  Recent Labs Lab 09/09/13 1142  AST 15  ALT 11  ALKPHOS 96  BILITOT 0.8  PROT 5.6*  ALBUMIN 2.5*   No results found for this basename: LIPASE, AMYLASE,  in the last 168 hours No results found for this basename: AMMONIA,  in the last 168 hours CBC:  Recent Labs Lab 09/09/13 1142 09/10/13 0330 09/11/13 0403  WBC 21.8* 14.6*  12.9*  NEUTROABS 19.5*  --   --   HGB 12.1 10.7* 11.1*  HCT 37.3 33.8* 35.9*  MCV 80.0 81.8 82.0  PLT 328 315 320   Cardiac Enzymes: No results found for this basename: CKTOTAL, CKMB, CKMBINDEX, TROPONINI,  in the last 168 hours BNP: BNP (last 3 results)  Recent Labs  02/25/13 1108  PROBNP 185.0*   CBG: No results found for this basename: GLUCAP,  in the last 168 hours     Signed:  Dia Crawford, MD [facility] [phone] pager

## 2013-09-12 NOTE — Progress Notes (Signed)
About 6:30pm I reassessed patient's arms because she was complaining of increased swelling.  I have marked the right arm due to a reddened area.  Given report to night nurse, S. Lelon Frohlich who is going to address patient's fluid status and swelling to the on-call doctor.  Patient has no complains of pain or difficulty breathing.

## 2013-09-12 NOTE — Evaluation (Signed)
Occupational Therapy Evaluation Patient Details Name: [name] MRN: [id number] DOB: 31-Mar-1929 Today's Date: 09/12/2013 Time: 5102-5852 OT Time Calculation (min): 27 min  OT Assessment / Plan / Recommendation History of present illness   Per chart: Pt is an 80 year old female with multiple medical problems as listed below including her, gastric outlet obstruction status post stents in the past, atrial fibrillation on chronic Coumadin, hematemesis followed by Dr. Deatra [name], history of aspiration who presents with above complaints. She states that she has had worsening of her cough in the past one week, along with subjective fevers up. She states that the cough is productive of dark yellowish sputum. Also patient states that she she had diarrhea in the past week-nonbloody, but that she's not had any in the past 2 days. She states that 4-5 days ago she was having some hematemesis again and so went to Dr. Kelby [name] office and was asked to hold off her Coumadin and she's taken it since then. She denies chest pain and no leg swelling. She states she's not had any further hematemesis or diarrhea today. She was seen in the ED and admitted for CAP.   Clinical Impression   Pt was admitted for the above.  She will benefit from skilled OT to increase strength and endurance for adls.  Pt is mod I at baseline and goals are set for supervision in acute.      OT Assessment  Patient needs continued OT Services    Follow Up Recommendations  SNF    Barriers to Discharge      Equipment Recommendations  3 in 1 bedside comode (if she doesn't have this)    Recommendations for Other Services    Frequency  Min 2X/week    Precautions / Restrictions Precautions Precautions: Fall Restrictions Weight Bearing Restrictions: No   Pertinent Vitals/Pain No pain reported    ADL  Grooming: Supervision/safety Where Assessed - Grooming: Supported standing Upper Body Bathing: Set up Where Assessed - Upper Body  Bathing: Unsupported sitting Lower Body Bathing: Minimal assistance Where Assessed - Lower Body Bathing: Supported sit to stand Upper Body Dressing: Set up Where Assessed - Upper Body Dressing: Unsupported sitting Lower Body Dressing: Minimal assistance Where Assessed - Lower Body Dressing: Supported sit to stand Toilet Transfer: Minimal assistance Toilet Transfer Method: Sit to Loss adjuster, chartered: Comfort height toilet;Grab bars Toileting - Water quality scientist and Hygiene: Min guard Where Assessed - Best boy and Hygiene: Sit to stand from 3-in-1 or toilet Equipment Used: Rolling walker Transfers/Ambulation Related to ADLs: ambulated to bathroom and then asked OT to walk with her in hall.  Walked down about 3 rooms and turned back around:  min guard given.  Pt wanted to walk again at end of session, but told her to rest and asked NT if she would walk with her a couple of doors later.  Pt states legs felt heavy on the way back ADL Comments: Pt usually mod I with adls.  Had difficulty donning L sock today Pt is on a liquid diet; she feels generally weaker than usual   OT Diagnosis: Generalized weakness  OT Problem List: Decreased strength;Decreased activity tolerance;Impaired balance (sitting and/or standing);Decreased knowledge of use of DME or AE OT Treatment Interventions: Self-care/ADL training;DME and/or AE instruction;Patient/family education;Balance training   OT Goals(Current goals can be found in the care plan section) Acute Rehab OT Goals Patient Stated Goal: get back to being independent as soon as I can OT Goal Formulation: With patient/family  Time For Goal Achievement: 09/26/13 Potential to Achieve Goals: Good ADL Goals Pt Will Perform Lower Body Bathing: with supervision;sit to/from stand Pt Will Perform Lower Body Dressing: with supervision;with adaptive equipment;sit to/from stand Pt Will Transfer to Toilet: with  supervision;ambulating;bedside commode (high commode) Pt Will Perform Toileting - Clothing Manipulation and hygiene: with supervision;sit to/from stand  Visit Information  Last OT Received On: 09/12/13 Assistance Needed: +1 History of Present Illness: Pt is an 80 year old female with multiple medical problems as listed below including her, gastric outlet obstruction status post stents in the past, atrial fibrillation on chronic Coumadin, hematemesis followed by Dr. Deatra [name], history of aspiration who presents with above complaints. She states that she has had worsening of her cough in the past one week, along with subjective fevers up. She states that the cough is productive of dark yellowish sputum. Also patient states that she she had diarrhea in the past week-nonbloody, but that she's not had any in the past 2 days. She states that 4-5 days ago she was having some hematemesis again and so went to Dr. Kelby [name] office and was asked to hold off her Coumadin and she's taken it since then. She denies chest pain and no leg swelling. She states she's not had any further hematemesis or diarrhea today. She was seen in the ED and admitted for CAP.       Prior [place] expects to be discharged to:: Private residence Living Arrangements: Alone Prior Function Level of Independence: Independent with assistive device(s) Comments: sleeps in recliner at home Communication Communication: No difficulties         Vision/Perception     Cognition  Cognition Arousal/Alertness: Awake/alert Behavior During Therapy: WFL for tasks assessed/performed Overall Cognitive Status: Within Functional Limits for tasks assessed    Extremity/Trunk Assessment Upper Extremity Assessment Upper Extremity Assessment: Overall WFL for tasks assessed (has tremor in LUE) Cervical / Trunk Assessment Cervical / Trunk Exceptions: neck contracture tilted L      Mobility Bed Mobility Overal  bed mobility: Modified Independent General bed mobility comments: HOB raised and pt used rails Transfers Sit to Stand: Min guard;Min assist General transfer comment: min guard from bed; min A from toilet     Exercise     Balance     End of Session OT - End of Session Activity Tolerance: Patient tolerated treatment well Patient left: in chair;with call bell/phone within reach Nurse Communication: Mobility status  GO     Erikka Follmer 09/12/2013, 4:52 PM Lesle Chris, OTR/L [phone] 09/12/2013

## 2013-09-13 DIAGNOSIS — R042 Hemoptysis: Secondary | ICD-10-CM

## 2013-09-13 DIAGNOSIS — K222 Esophageal obstruction: Secondary | ICD-10-CM

## 2013-09-13 DIAGNOSIS — J45909 Unspecified asthma, uncomplicated: Secondary | ICD-10-CM

## 2013-09-13 DIAGNOSIS — Z7901 Long term (current) use of anticoagulants: Secondary | ICD-10-CM

## 2013-09-13 DIAGNOSIS — E876 Hypokalemia: Secondary | ICD-10-CM

## 2013-09-13 LAB — CBC WITH DIFFERENTIAL/PLATELET
BASOS ABS: 0 10*3/uL (ref 0.0–0.1)
Basophils Relative: 0 % (ref 0–1)
Eosinophils Absolute: 0 10*3/uL (ref 0.0–0.7)
Eosinophils Relative: 0 % (ref 0–5)
HEMATOCRIT: 36.6 % (ref 36.0–46.0)
Hemoglobin: 11.6 g/dL — ABNORMAL LOW (ref 12.0–15.0)
Lymphocytes Relative: 14 % (ref 12–46)
Lymphs Abs: 0.7 10*3/uL (ref 0.7–4.0)
MCH: 25.6 pg — ABNORMAL LOW (ref 26.0–34.0)
MCHC: 31.7 g/dL (ref 30.0–36.0)
MCV: 80.8 fL (ref 78.0–100.0)
Monocytes Absolute: 0.1 10*3/uL (ref 0.1–1.0)
Monocytes Relative: 2 % — ABNORMAL LOW (ref 3–12)
NEUTROS ABS: 4.4 10*3/uL (ref 1.7–7.7)
Neutrophils Relative %: 84 % — ABNORMAL HIGH (ref 43–77)
PLATELETS: 333 10*3/uL (ref 150–400)
RBC: 4.53 MIL/uL (ref 3.87–5.11)
RDW: 16.1 % — AB (ref 11.5–15.5)
WBC: 5.2 10*3/uL (ref 4.0–10.5)

## 2013-09-13 LAB — COMPREHENSIVE METABOLIC PANEL
ALBUMIN: 2 g/dL — AB (ref 3.5–5.2)
ALK PHOS: 121 U/L — AB (ref 39–117)
ALT: 23 U/L (ref 0–35)
AST: 21 U/L (ref 0–37)
BILIRUBIN TOTAL: 0.2 mg/dL — AB (ref 0.3–1.2)
CO2: 24 mEq/L (ref 19–32)
CREATININE: 0.55 mg/dL (ref 0.50–1.10)
Calcium: 8.5 mg/dL (ref 8.4–10.5)
Chloride: 101 mEq/L (ref 96–112)
GFR calc non Af Amer: 85 mL/min — ABNORMAL LOW (ref >90)
Glucose, Bld: 183 mg/dL — ABNORMAL HIGH (ref 70–99)
Potassium: 3.9 mEq/L (ref 3.7–5.3)
Sodium: 137 mEq/L (ref 137–147)
TOTAL PROTEIN: 4.9 g/dL — AB (ref 6.0–8.3)

## 2013-09-13 LAB — PROTIME-INR
INR: 1.16 (ref 0.00–1.49)
Prothrombin Time: 14.6 seconds (ref 11.6–15.2)

## 2013-09-13 LAB — STOOL CULTURE

## 2013-09-13 LAB — MAGNESIUM: Magnesium: 1.5 mg/dL (ref 1.5–2.5)

## 2013-09-13 MED ORDER — WARFARIN SODIUM 2.5 MG PO TABS
2.5000 mg | ORAL_TABLET | Freq: Once | ORAL | Status: AC
  Administered 2013-09-13: 2.5 mg via ORAL
  Filled 2013-09-13: qty 1

## 2013-09-13 NOTE — Progress Notes (Signed)
Patient has redden cheeks, bilateral arms are red and swollen---watery pockets upper arm. Lower legs are edematous and none tender. Pt states she feels very heavy--- has congested cough. Pt ambulated in the hall about 100 feet without difficulty. Tolerated well, denies SOB after ambulation. SCD ordered. MD notified and will monitor. SRP, RN

## 2013-09-13 NOTE — Progress Notes (Signed)
ANTICOAGULATION CONSULT NOTE - Follow Up  Pharmacy Consult for Lovenox and Coumadin Indication: atrial fibrillation  Allergies  Allergen Reactions  . Erythromycin Ethylsuccinate     Irregular pulse rate  . Prednisone     "makes me hyper"    Patient Measurements: Height: 5' (152.4 cm) Weight: 125 lb 7.1 oz (56.9 kg) IBW/kg (Calculated) : 45.5  Vital Signs: Temp: 98.3 F (36.8 C) (02/13 0655) Temp src: Oral (02/13 0655) BP: 107/63 mmHg (02/13 0655) Pulse Rate: 74 (02/13 1000)  Labs:  Recent Labs  09/11/13 0403 09/13/13 0432  HGB 11.1* 11.6*  HCT 35.9* 36.6  PLT 320 333  LABPROT  --  14.6  INR  --  1.16  CREATININE 0.64 0.55    Estimated Creatinine Clearance: 42.9 ml/min (by C-G formula based on Cr of 0.55).   Medical History: Past Medical History  Diagnosis Date  . GERD (gastroesophageal reflux disease)   . Osteoporosis   . Postmenopausal HRT (hormone replacement therapy)   . Anemia   . Hypertension   . Allergy   . Asthma   . Shortness of breath   . PONV (postoperative nausea and vomiting)   . Pneumonia      Hx of pneumonia,53months ago  . Dysrhythmia     a fib  . Headache(784.0)   . Esophageal cancer     removed esophagus stomach pulled up    Medications:  Prescriptions prior to admission  Medication Sig Dispense Refill  . acetaminophen (TYLENOL) 500 MG tablet Take 500 mg by mouth daily as needed for headache.      . albuterol (PROVENTIL HFA;VENTOLIN HFA) 108 (90 BASE) MCG/ACT inhaler Inhale 2 puffs into the lungs every 6 (six) hours as needed. For wheezing  1 Inhaler  11  . arformoterol (BROVANA) 15 MCG/2ML NEBU Take 2 mLs (15 mcg total) by nebulization 2 (two) times daily. Dx: 493.00  120 mL  12  . atenolol-chlorthalidone (TENORETIC) 50-25 MG per tablet Take 1 tablet by mouth daily.      . budesonide (PULMICORT) 0.25 MG/2ML nebulizer solution Take 2 mLs (0.25 mg total) by nebulization 2 (two) times daily. Dx: 493.00  120 mL  12  . Calcium-Vitamin  D-Vitamin K (VIACTIV) 295-284-13 MG-UNT-MCG CHEW Chew by mouth 3 (three) times daily.        . chlorpheniramine-HYDROcodone (TUSSIONEX PENNKINETIC ER) 10-8 MG/5ML LQCR Take 5 mLs by mouth every 12 (twelve) hours as needed for cough (sedation caution).  120 mL  0  . digoxin (LANOXIN) 0.125 MG tablet Take 1 tablet (0.125 mg total) by mouth daily.  90 tablet  3  . Fe Fum-FA-B Cmp-C-Zn-Mg-Mn-Cu (HEMOCYTE PLUS) 106-1 MG CAPS Take 106 mg by mouth 2 (two) times daily.  180 each  3  . lansoprazole (PREVACID SOLUTAB) 30 MG disintegrating tablet Take 2 tablets (60 mg total) by mouth 2 (two) times daily.  360 tablet  3  . levocetirizine (XYZAL) 5 MG tablet Take 5 mg by mouth every evening. Patient takes only as needed      . metoCLOPramide (REGLAN) 5 MG tablet Take 5 mg by mouth 2 (two) times daily.      . mometasone-formoterol (DULERA) 100-5 MCG/ACT AERO Take 2 puffs first thing in am and then another 2 puffs about 12 hours later.  1 Inhaler  11  . Olopatadine HCl (PATADAY) 0.2 % SOLN Apply 1-2 drops to eye 2 (two) times daily as needed (dry, itchy eyes.).      Marland Kitchen phenol (CHLORASEPTIC) 1.4 %  LIQD Use as directed 1 spray in the mouth or throat as needed for throat irritation / pain.      . promethazine (PHENERGAN) 25 MG suppository Place 25 mg rectally every 6 (six) hours as needed for nausea or vomiting.      . sucralfate (CARAFATE) 1 GM/10ML suspension Take 10 mLs (1 g total) by mouth 2 (two) times daily.  1800 mL  3  . traMADol (ULTRAM) 50 MG tablet Take 50 mg by mouth every 6 (six) hours as needed for moderate pain.      . Vitamin D, Ergocalciferol, (DRISDOL) 50000 UNITS CAPS Take 1 capsule (50,000 Units total) by mouth every 7 (seven) days. On Friday  30 capsule  3  . warfarin (COUMADIN) 5 MG tablet Take 5 mg by mouth daily.       Marland Kitchen zolpidem (AMBIEN) 5 MG tablet Take 1 tablet (5 mg total) by mouth at bedtime as needed for sleep.  90 tablet  1   Scheduled:  . arformoterol  15 mcg Nebulization BID  .  B-complex with vitamin C  1 tablet Oral BID  . budesonide  0.25 mg Nebulization BID  . calcium-vitamin D  1 tablet Oral TID  . cefTRIAXone (ROCEPHIN)  IV  1 g Intravenous Q24H  . dextromethorphan-guaiFENesin  1 tablet Oral q morning - 10a  . digoxin  0.125 mg Oral Daily  . doxycycline  100 mg Oral Q12H  . enoxaparin (LOVENOX) injection  1 mg/kg Subcutaneous Q12H  . guaiFENesin-codeine  10 mL Oral QHS  . lansoprazole  60 mg Oral BID  . metoCLOPramide  5 mg Oral BID  . sucralfate  1 g Oral BID  . Warfarin - Pharmacist Dosing Inpatient   Does not apply q1800    Assessment: 80yo F on chronic Coumadin for A.fib at dose of 5mg  daily was admitted 2/9 with fever, productive cough, and diarrhea. On 2/5 Coumadin was held d/t hematemesis. EGD on 2/11 showed no gross bleeding. Pharmacy is asked to resume anticoagulation with Lovenox bridging to Coumadin.   INR subtherapeutic as expected after warfarin restarted yesterday after being held since 2/5  Warfarin doses as inpatient: 6mg   CBC stable. No bleeding reported/documented.  SCr is wnl, CrCl ~84ml/min.  Doxycycline and Rocephin can increase sensitivity to Coumadin.  Goal of Therapy:  INR 2-3 Monitor platelets by anticoagulation protocol: Yes   Plan:  1) Continue Lovenox 55mg  SQ q12 until INR > 2 2) Per patient's age and fact when admitted warfarin was being held due to hematemesis, it appears that 5mg  daily was likely too high of a dose for patient. As a result, will give only 2.5mg  today after 6mg  was given last night 3) Daily INR and follow renal function and CBC   Adrian Saran, PharmD, BCPS Pager [phone] 09/13/2013 11:34 AM

## 2013-09-13 NOTE — Progress Notes (Signed)
[facility] PROGRESS NOTE  [name] DTO:[id number] DOB: 11-13-1928 DOA: 09/09/2013 PCP: Georgetta Haber, MD  Assessment/Plan:  CAP (community acquired pneumonia)  -Continue empiric antibiotics with Rocephin and doxycycline  -Leukocytosis improving, BP soft but stable today  -C. difficile negative and influenza PCR also negative. Follow up in culture  -She has had some aspiration in the past secondary to the GI problems listed below; speech therapy has evaluated patient and believes she has a good regimen to prevent her from aspirating no further eval required -Advance patient's diet to bland -Leukocytosis has resolved. Continue CAP protocol   Sepsis syndrome  -Secondary to #1, patient was tachycardic in EDP, febrile to 103.3, and initially BPs were stable but 86/68 on recheck.  -Patient drinking/eating saline lock IV except for antibiotics  -Improving clinically as above, continue   GERD  -Continue outpatient medications   Gastric outlet obstruction  -s/p stents in the past.  -Followed by GI/Dr. Deatra Ina   Asthma/COPD  -Stable, continue outpatient medications   History of hematemesis  -Followed by Dr. Deatra Ina  -Hemoglobin trending up      History of atrial fibrillation -with chronic anticoagulation/Coumadin held since Thursday 2/5  -She is in normal sinus rhythm at this time, when necessary IV metoprolol ordered>> follow.  -restarted Coumadin per pharmacy on 2/12, will cover with Lovenox until therapeutic -2/13 INR= 1.16  Hypokalemia  -Resolved      Code Status: Full  Family Communication:   Disposition Plan: Transfer to telemetry   Consultants:   Procedures: CT chest with contrast 09/12/2013 There are patchy airspace opacities involving the right middle lobe  correlating chest x-ray finding, consistent with pneumonia. No focal  discrete pulmonary mass is noted. There patchy airspace  consolidation of bilateral lobes consistent with pneumonias.  Small  bilateral pleural effusions   Antibiotics: Rocephin 2/8>> Doxycycline 2/8>>    HPI/Subjective: [name] is a 80 y.o.WF PMHx    female with multiple medical problems as listed below including her, gastric outlet obstruction status post stents in the past, atrial fibrillation on chronic Coumadin, hematemesis followed by Dr. Deatra Ina, history of aspiration who presents with above complaints. She states that she has had worsening of her cough in the past one week, along with subjective fevers up. She states that the cough is productive of dark yellowish sputum. Also patient states that she she had diarrhea in the past week-nonbloody, but that she's not had any in the past 2 days. She states that 4-5 days ago she was having some hematemesis again and so went to Dr. Kelby Fam office and was asked to hold off her Coumadin and she's taken it since then. She denies chest pain and no leg swelling. She states she's not had any further hematemesis or diarrhea today. She was seen in the ED  EKG showed normal sinus rhythm at 93, chest x-ray showed new consolidation in the right mid lower lung zone.  She was febrile to 103.3 in ED with white cell count of 21.8, lactic acid normal at 0.93. She started on empiric antibiotics with Rocephin and doxycycline and admitted for further evaluation and management. 2/13 patient feeling significantly improved requests to walk around ward as much as possible. Encourage patient to do so but with supervision.   Objective: Filed Vitals:   09/12/13 2159 09/13/13 0655 09/13/13 0857 09/13/13 1000  BP: 127/68 107/63    Pulse: 91 78  74  Temp: 99 F (37.2 C) 98.3 F (36.8 C)    TempSrc: Oral  Oral    Resp: 18 19    Height:      Weight:      SpO2: 94% 94% 95%     Intake/Output Summary (Last 24 hours) at 09/13/13 1100 Last data filed at 09/12/13 1700  Gross per 24 hour  Intake    360 ml  Output      0 ml  Net    360 ml   Filed Weights   09/09/13 1529  09/10/13 1255  Weight: 55.1 kg (121 lb 7.6 oz) 56.9 kg (125 lb 7.1 oz)    Exam:   General:  A./O. x4, NAD  Cardiovascular: Regular rate,  regular rhythm negative murmurs rubs gallops,  Respiratory: Diffuse rhonchi but improving  Abdomen: Soft, nontender, nondistended, plus bowel sound  Musculoskeletal: Negative pedal edema   Data Reviewed: Basic Metabolic Panel:  Recent Labs Lab 09/09/13 1142 09/09/13 1755 09/10/13 0330 09/11/13 0403 09/13/13 0432  NA 135* 135* 136* 136* 137  K 3.7 3.5* 3.1* 3.9 3.9  CL 95* 97 99 102 101  CO2 _0 GLUCOSE 137* 124* 100* 106* 183*  BUN _1 5* <3*  CREATININE 0.71 0.74 0.70 0.64 0.55  CALCIUM 8.1* 7.5* 7.1* 7.6* 8.5  MG  --   --   --   --  1.5   Liver Function Tests:  Recent Labs Lab 09/09/13 1142 09/13/13 0432  AST 15 21  ALT 11 23  ALKPHOS 96 121*  BILITOT 0.8 0.2*  PROT 5.6* 4.9*  ALBUMIN 2.5* 2.0*   No results found for this basename: LIPASE, AMYLASE,  in the last 168 hours No results found for this basename: AMMONIA,  in the last 168 hours CBC:  Recent Labs Lab 09/09/13 1142 09/10/13 0330 09/11/13 0403 09/13/13 0432  WBC 21.8* 14.6* 12.9* 5.2  NEUTROABS 19.5*  --   --  4.4  HGB 12.1 10.7* 11.1* 11.6*  HCT 37.3 33.8* 35.9* 36.6  MCV 80.0 81.8 82.0 80.8  PLT 328 315 320 333   Cardiac Enzymes: No results found for this basename: CKTOTAL, CKMB, CKMBINDEX, TROPONINI,  in the last 168 hours BNP (last 3 results)  Recent Labs  02/25/13 1108  PROBNP 185.0*   CBG: No results found for this basename: GLUCAP,  in the last 168 hours  Recent Results (from the past 240 hour(s))  URINE CULTURE     Status: None   Collection Time    09/09/13 12:14 PM      Result Value Ref Range Status   Specimen Description URINE, RANDOM   Final   Special Requests NONE   Final   Culture  Setup Time     Final   Value: 09/09/2013 21:01     Performed at [place]     Final   Value:  >=100,000 COLONIES/ML     Performed at [facility]   Culture     Final   Value: ESCHERICHIA COLI     Performed at [facility]   Report Status 09/11/2013 FINAL   Final   Organism ID, Bacteria ESCHERICHIA COLI   Final  RESPIRATORY VIRUS PANEL     Status: None   Collection Time    09/09/13 12:23 PM      Result Value Ref Range Status   Source - [facility]AN NASAL SWAB   Corrected   Comment: CORRECTED ON 02/10 AT 1843: PREVIOUSLY REPORTED AS NASAL SWAB   Respiratory Syncytial Virus  A NOT DETECTED   Final   Respiratory Syncytial Virus B NOT DETECTED   Final   Influenza A NOT DETECTED   Final   Influenza B NOT DETECTED   Final   Parainfluenza 1 NOT DETECTED   Final   Parainfluenza 2 NOT DETECTED   Final   Parainfluenza 3 NOT DETECTED   Final   Metapneumovirus NOT DETECTED   Final   Rhinovirus NOT DETECTED   Final   Adenovirus NOT DETECTED   Final   Influenza A H1 NOT DETECTED   Final   Influenza A H3 NOT DETECTED   Final   Comment: (NOTE)           Normal Reference Range for each Analyte: NOT DETECTED     Testing performed using the Luminex xTAG Respiratory Viral Panel test     kit.     This test was developed and its performance characteristics determined     by [facility]. It has not been cleared or approved by the [place]     Food and Drug Administration. This test is used for clinical purposes.     It should not be regarded as investigational or for research. This     laboratory is certified under the [place] (CLIA) as qualified to perform high complexity     clinical laboratory testing.     Performed at [facility] PCR SCREENING     Status: None   Collection Time    09/09/13  3:21 PM      Result Value Ref Range Status   MRSA by PCR NEGATIVE  NEGATIVE Final   Comment:            The GeneXpert MRSA Assay (FDA     approved for NASAL specimens     only), is one component of a     comprehensive MRSA  colonization     surveillance program. It is not     intended to diagnose MRSA     infection nor to guide or     monitor treatment for     MRSA infections.  CULTURE, BLOOD (ROUTINE X 2)     Status: None   Collection Time    09/09/13  5:55 PM      Result Value Ref Range Status   Specimen Description BLOOD RIGHT ARM   Final   Special Requests BOTTLES DRAWN AEROBIC AND ANAEROBIC 10CC   Final   Culture  Setup Time     Final   Value: 09/10/2013 00:18     Performed at [facility]   Culture     Final   Value:        BLOOD CULTURE RECEIVED NO GROWTH TO DATE CULTURE WILL BE HELD FOR 5 DAYS BEFORE ISSUING A FINAL NEGATIVE REPORT     Performed at [facility]   Report Status PENDING   Incomplete  CULTURE, BLOOD (ROUTINE X 2)     Status: None   Collection Time    09/09/13  6:05 PM      Result Value Ref Range Status   Specimen Description BLOOD RIGHT ARM   Final   Special Requests BOTTLES DRAWN AEROBIC ONLY 1CC   Final   Culture  Setup Time     Final   Value: 09/10/2013 00:19     Performed at [facility]   Culture     Final  Value:        BLOOD CULTURE RECEIVED NO GROWTH TO DATE CULTURE WILL BE HELD FOR 5 DAYS BEFORE ISSUING A FINAL NEGATIVE REPORT     Performed at [facility]   Report Status PENDING   Incomplete  CULTURE, EXPECTORATED SPUTUM-ASSESSMENT     Status: None   Collection Time    09/09/13  6:25 PM      Result Value Ref Range Status   Specimen Description SPUTUM   Final   Special Requests NONE   Final   Sputum evaluation     Final   Value: THIS SPECIMEN IS ACCEPTABLE. RESPIRATORY CULTURE REPORT TO FOLLOW.   Report Status 09/09/2013 FINAL   Final  CULTURE, RESPIRATORY (NON-EXPECTORATED)     Status: None   Collection Time    09/09/13  7:41 PM      Result Value Ref Range Status   Specimen Description SPUTUM   Final   Special Requests NONE   Final   Gram Stain     Final   Value: FEW WBC PRESENT, PREDOMINANTLY PMN     RARE SQUAMOUS  EPITHELIAL CELLS PRESENT     FEW GRAM POSITIVE COCCI     IN PAIRS IN CHAINS RARE GRAM NEGATIVE RODS     Performed at [facility]   Culture     Final   Value: NORMAL OROPHARYNGEAL FLORA     Performed at [facility]   Report Status 09/12/2013 FINAL   Final  CLOSTRIDIUM DIFFICILE BY PCR     Status: None   Collection Time    09/09/13 10:00 PM      Result Value Ref Range Status   C difficile by pcr NEGATIVE  NEGATIVE Final   Comment: Performed at [place]     Status: None   Collection Time    09/09/13 10:00 PM      Result Value Ref Range Status   Specimen Description STOOL   Final   Special Requests NONE   Final   Culture     Final   Value: NO SALMONELLA, SHIGELLA, CAMPYLOBACTER, YERSINIA, OR E.COLI 0157:H7 ISOLATED     Performed at [facility]   Report Status 09/13/2013 FINAL   Final     Studies: Ct Chest W Contrast  09/12/2013   CLINICAL DATA:  Shortness of breath with abnormal chest x-ray  EXAM: CT CHEST WITH CONTRAST  TECHNIQUE: Multidetector CT imaging of the chest was performed during intravenous contrast administration.  CONTRAST:  12m OMNIPAQUE IOHEXOL 300 MG/ML  SOLN  COMPARISON:  Chest x-ray September 09, 2013  FINDINGS: There is no mediastinal or hilar lymphadenopathy. The heart size is enlarged. There is no pericardial effusion. There is a gastric pull-through with a stent in place. There are small bilateral pleural effusions. There are patchy airspace opacity involving the right middle lobe correlating to the chest x-ray finding. There are patchy air space consolidation of bilateral lower lobes. The visualized upper abdominal structures unremarkable. There is question parapelvic cyst in the superior left kidney, incompletely included.  IMPRESSION: There are patchy airspace opacities involving the right middle lobe correlating chest x-ray finding, consistent with pneumonia. No focal discrete pulmonary mass is noted. There patchy  airspace consolidation of bilateral lobes consistent with pneumonias. Small bilateral pleural effusions.   Electronically Signed   By: WAbelardo DieselM.D.   On: 09/12/2013 21:54    Scheduled Meds: . arformoterol  15 mcg Nebulization BID  .  B-complex with vitamin C  1 tablet Oral BID  . budesonide  0.25 mg Nebulization BID  . calcium-vitamin D  1 tablet Oral TID  . cefTRIAXone (ROCEPHIN)  IV  1 g Intravenous Q24H  . dextromethorphan-guaiFENesin  1 tablet Oral q morning - 10a  . digoxin  0.125 mg Oral Daily  . doxycycline  100 mg Oral Q12H  . enoxaparin (LOVENOX) injection  1 mg/kg Subcutaneous Q12H  . guaiFENesin-codeine  10 mL Oral QHS  . lansoprazole  60 mg Oral BID  . metoCLOPramide  5 mg Oral BID  . sucralfate  1 g Oral BID  . Warfarin - Pharmacist Dosing Inpatient   Does not apply q1800   Continuous Infusions: . sodium chloride 100 mL/hr at 09/12/13 0919  . sodium chloride 50 mL/hr at 09/13/13 0015    Active Problems:   GERD   Gastric outlet obstruction   CAP (community acquired pneumonia)   Sepsis   PNA (pneumonia)   A-fib   Unspecified asthma(493.90)   Hematemesis   Acute blood loss anemia    Time spent: 40 minute   Trudi Morgenthaler, [place], J  [facility] Pager [phone]. If 7PM-7AM, please contact night-coverage at [email or web address], password [facility] 09/13/2013, 11:00 AM  LOS: 4 days

## 2013-09-14 DIAGNOSIS — J690 Pneumonitis due to inhalation of food and vomit: Secondary | ICD-10-CM

## 2013-09-14 LAB — MAGNESIUM: MAGNESIUM: 1.5 mg/dL (ref 1.5–2.5)

## 2013-09-14 LAB — COMPREHENSIVE METABOLIC PANEL
ALBUMIN: 2 g/dL — AB (ref 3.5–5.2)
ALT: 16 U/L (ref 0–35)
AST: 13 U/L (ref 0–37)
Alkaline Phosphatase: 98 U/L (ref 39–117)
BUN: 3 mg/dL — ABNORMAL LOW (ref 6–23)
CALCIUM: 8.7 mg/dL (ref 8.4–10.5)
CO2: 28 mEq/L (ref 19–32)
Chloride: 104 mEq/L (ref 96–112)
Creatinine, Ser: 0.58 mg/dL (ref 0.50–1.10)
GFR calc Af Amer: >90 mL/min (ref >90)
GFR calc non Af Amer: 84 mL/min — ABNORMAL LOW (ref >90)
Glucose, Bld: 92 mg/dL (ref 70–99)
POTASSIUM: 3.4 meq/L — AB (ref 3.7–5.3)
Sodium: 142 mEq/L (ref 137–147)
Total Bilirubin: <0.2 mg/dL — ABNORMAL LOW (ref 0.3–1.2)
Total Protein: 4.6 g/dL — ABNORMAL LOW (ref 6.0–8.3)

## 2013-09-14 LAB — CBC WITH DIFFERENTIAL/PLATELET
BASOS PCT: 0 % (ref 0–1)
Basophils Absolute: 0 10*3/uL (ref 0.0–0.1)
EOS ABS: 0.1 10*3/uL (ref 0.0–0.7)
EOS PCT: 2 % (ref 0–5)
HEMATOCRIT: 34.1 % — AB (ref 36.0–46.0)
HEMOGLOBIN: 10.8 g/dL — AB (ref 12.0–15.0)
Lymphocytes Relative: 28 % (ref 12–46)
Lymphs Abs: 2.5 10*3/uL (ref 0.7–4.0)
MCH: 25.5 pg — ABNORMAL LOW (ref 26.0–34.0)
MCHC: 31.7 g/dL (ref 30.0–36.0)
MCV: 80.4 fL (ref 78.0–100.0)
MONO ABS: 0.9 10*3/uL (ref 0.1–1.0)
Monocytes Relative: 10 % (ref 3–12)
NEUTROS ABS: 5.4 10*3/uL (ref 1.7–7.7)
Neutrophils Relative %: 60 % (ref 43–77)
Platelets: 433 10*3/uL — ABNORMAL HIGH (ref 150–400)
RBC: 4.24 MIL/uL (ref 3.87–5.11)
RDW: 16.5 % — ABNORMAL HIGH (ref 11.5–15.5)
WBC: 8.9 10*3/uL (ref 4.0–10.5)

## 2013-09-14 LAB — PROTIME-INR
INR: 1.82 — ABNORMAL HIGH (ref 0.00–1.49)
Prothrombin Time: 20.5 seconds — ABNORMAL HIGH (ref 11.6–15.2)

## 2013-09-14 MED ORDER — WARFARIN SODIUM 1 MG PO TABS
1.0000 mg | ORAL_TABLET | Freq: Once | ORAL | Status: AC
  Administered 2013-09-14: 1 mg via ORAL
  Filled 2013-09-14: qty 1

## 2013-09-14 NOTE — Progress Notes (Signed)
ANTICOAGULATION CONSULT NOTE - Follow Up  Pharmacy Consult for Lovenox and Coumadin Indication: atrial fibrillation  Allergies  Allergen Reactions  . Erythromycin Ethylsuccinate     Irregular pulse rate  . Prednisone     "makes me hyper"    Patient Measurements: Height: 5' (152.4 cm) Weight: 125 lb 7.1 oz (56.9 kg) IBW/kg (Calculated) : 45.5  Vital Signs: Temp: 98.2 F (36.8 C) (02/14 0850) Temp src: Oral (02/14 0850) BP: 114/74 mmHg (02/14 0850) Pulse Rate: 93 (02/14 0850)  Labs:  Recent Labs  09/13/13 0432 09/14/13 0450  HGB 11.6* 10.8*  HCT 36.6 34.1*  PLT 333 433*  LABPROT 14.6 20.5*  INR 1.16 1.82*  CREATININE 0.55 0.58    Estimated Creatinine Clearance: 42.9 ml/min (by C-G formula based on Cr of 0.58).   Medical History: Past Medical History  Diagnosis Date  . GERD (gastroesophageal reflux disease)   . Osteoporosis   . Postmenopausal HRT (hormone replacement therapy)   . Anemia   . Hypertension   . Allergy   . Asthma   . Shortness of breath   . PONV (postoperative nausea and vomiting)   . Pneumonia      Hx of pneumonia,68months ago  . Dysrhythmia     a fib  . Headache(784.0)   . Esophageal cancer     removed esophagus stomach pulled up    Medications:  Prescriptions prior to admission  Medication Sig Dispense Refill  . acetaminophen (TYLENOL) 500 MG tablet Take 500 mg by mouth daily as needed for headache.      . albuterol (PROVENTIL HFA;VENTOLIN HFA) 108 (90 BASE) MCG/ACT inhaler Inhale 2 puffs into the lungs every 6 (six) hours as needed. For wheezing  1 Inhaler  11  . arformoterol (BROVANA) 15 MCG/2ML NEBU Take 2 mLs (15 mcg total) by nebulization 2 (two) times daily. Dx: 493.00  120 mL  12  . atenolol-chlorthalidone (TENORETIC) 50-25 MG per tablet Take 1 tablet by mouth daily.      . budesonide (PULMICORT) 0.25 MG/2ML nebulizer solution Take 2 mLs (0.25 mg total) by nebulization 2 (two) times daily. Dx: 493.00  120 mL  12  .  Calcium-Vitamin D-Vitamin K (VIACTIV) 240-973-53 MG-UNT-MCG CHEW Chew by mouth 3 (three) times daily.        . chlorpheniramine-HYDROcodone (TUSSIONEX PENNKINETIC ER) 10-8 MG/5ML LQCR Take 5 mLs by mouth every 12 (twelve) hours as needed for cough (sedation caution).  120 mL  0  . digoxin (LANOXIN) 0.125 MG tablet Take 1 tablet (0.125 mg total) by mouth daily.  90 tablet  3  . Fe Fum-FA-B Cmp-C-Zn-Mg-Mn-Cu (HEMOCYTE PLUS) 106-1 MG CAPS Take 106 mg by mouth 2 (two) times daily.  180 each  3  . lansoprazole (PREVACID SOLUTAB) 30 MG disintegrating tablet Take 2 tablets (60 mg total) by mouth 2 (two) times daily.  360 tablet  3  . levocetirizine (XYZAL) 5 MG tablet Take 5 mg by mouth every evening. Patient takes only as needed      . metoCLOPramide (REGLAN) 5 MG tablet Take 5 mg by mouth 2 (two) times daily.      . mometasone-formoterol (DULERA) 100-5 MCG/ACT AERO Take 2 puffs first thing in am and then another 2 puffs about 12 hours later.  1 Inhaler  11  . Olopatadine HCl (PATADAY) 0.2 % SOLN Apply 1-2 drops to eye 2 (two) times daily as needed (dry, itchy eyes.).      Marland Kitchen phenol (CHLORASEPTIC) 1.4 % LIQD Use as directed  1 spray in the mouth or throat as needed for throat irritation / pain.      . promethazine (PHENERGAN) 25 MG suppository Place 25 mg rectally every 6 (six) hours as needed for nausea or vomiting.      . sucralfate (CARAFATE) 1 GM/10ML suspension Take 10 mLs (1 g total) by mouth 2 (two) times daily.  1800 mL  3  . traMADol (ULTRAM) 50 MG tablet Take 50 mg by mouth every 6 (six) hours as needed for moderate pain.      . Vitamin D, Ergocalciferol, (DRISDOL) 50000 UNITS CAPS Take 1 capsule (50,000 Units total) by mouth every 7 (seven) days. On Friday  30 capsule  3  . warfarin (COUMADIN) 5 MG tablet Take 5 mg by mouth daily.       Marland Kitchen zolpidem (AMBIEN) 5 MG tablet Take 1 tablet (5 mg total) by mouth at bedtime as needed for sleep.  90 tablet  1   Scheduled:  . arformoterol  15 mcg  Nebulization BID  . B-complex with vitamin C  1 tablet Oral BID  . budesonide  0.25 mg Nebulization BID  . calcium-vitamin D  1 tablet Oral TID  . cefTRIAXone (ROCEPHIN)  IV  1 g Intravenous Q24H  . dextromethorphan-guaiFENesin  1 tablet Oral q morning - 10a  . digoxin  0.125 mg Oral Daily  . doxycycline  100 mg Oral Q12H  . enoxaparin (LOVENOX) injection  1 mg/kg Subcutaneous Q12H  . guaiFENesin-codeine  10 mL Oral QHS  . lansoprazole  60 mg Oral BID  . metoCLOPramide  5 mg Oral BID  . sucralfate  1 g Oral BID  . Warfarin - Pharmacist Dosing Inpatient   Does not apply q1800    Assessment: 80yo F on chronic Coumadin for A.fib at dose of 5mg  daily was admitted 2/9 with fever, productive cough, and diarrhea. On 2/5 Coumadin was held d/t hematemesis. EGD on 2/11 showed no gross bleeding. Pharmacy is asked to resume anticoagulation with Lovenox bridging to Coumadin.   INR today  = 1.82 after coumadin doses: 6mg  (1/03 at midnight), 2.5mg  (2/13).  Very quick rise in INR, will likely continue to rise from effects of 6mg  dose.  Will be very conservative tonight.   CBC stable. No bleeding reported/documented.  SCr is wnl, CrCl ~70ml/min.  Doxycycline and Rocephin can increase sensitivity to Coumadin.  Goal of Therapy:  INR 2-3 Monitor platelets by anticoagulation protocol: Yes   Plan:  Continue Lovenox 55mg  SQ q12 until INR > 2.  Coumadin 1mg  po x 1 tonight.  F/u daily PT/INR   Ralene Bathe, PharmD, BCPS 09/14/2013, 1:29 PM  Pager: [phone]

## 2013-09-14 NOTE — Progress Notes (Signed)
[facility] PROGRESS NOTE  TRENYCE LOERA AOZ:[id number] DOB: 1929/01/04 DOA: 09/09/2013 PCP: Georgetta Haber, MD  Assessment/Plan:  CAP (community acquired pneumonia)  -Continue empiric antibiotics with Rocephin and doxycycline  -Leukocytosis resolved  -C. difficile negative and influenza PCR also negative. Follow up in culture  -She has had some aspiration in the past secondary to the GI problems listed below; speech therapy has evaluated patient and believes she has a good regimen to prevent her from aspirating no further eval required -Continue diet regular  -Leukocytosis has resolved. DC antibiotics  on 2/15  Sepsis syndrome  -Patient drinking/eating saline lock IV except for antibiotics  -Improving clinically as above, continue   GERD  -Continue outpatient medications   Gastric outlet obstruction  -s/p stents in the past.  -Followed by GI/Dr. Deatra Ina   Asthma/COPD  -Stable, continue outpatient medications   History of hematemesis  -Followed by Dr. Deatra Ina  -Hemoglobin stable   History of atrial fibrillation -with chronic anticoagulation/Coumadin held since Thursday 2/5  -She is in normal sinus rhythm at this time, when necessary IV metoprolol ordered>> follow.  -restarted Coumadin per pharmacy on 2/12, will cover with Lovenox until therapeutic -2/14  INR= 1. 82  Hypokalemia  -Resolved   Red-faced -Most likely secondary to doxycycline. Patient has received last dose today  Upper strandy edema -Secondary to fluid overload will apply bilateral ACE wraps   Code Status: Full  Family Communication:   Disposition Plan: Transfer to telemetry   Consultants:   Procedures: CT chest with contrast 09/12/2013 There are patchy airspace opacities involving the right middle lobe  correlating chest x-ray finding, consistent with pneumonia. No focal  discrete pulmonary mass is noted. There patchy airspace  consolidation of bilateral lobes consistent with  pneumonias. Small  bilateral pleural effusions   Antibiotics: Rocephin 2/8>> 2/14 Doxycycline 2/8>> 2/14    HPI/Subjective: ALAURA [name] is a 80 y.o.WF PMHx    female with multiple medical problems as listed below including her, gastric outlet obstruction status post stents in the past, atrial fibrillation on chronic Coumadin, hematemesis followed by Dr. Deatra Ina, history of aspiration who presents with above complaints. She states that she has had worsening of her cough in the past one week, along with subjective fevers up. She states that the cough is productive of dark yellowish sputum. Also patient states that she she had diarrhea in the past week-nonbloody, but that she's not had any in the past 2 days. She states that 4-5 days ago she was having some hematemesis again and so went to Dr. Kelby Fam office and was asked to hold off her Coumadin and she's taken it since then. She denies chest pain and no leg swelling. She states she's not had any further hematemesis or diarrhea today. She was seen in the ED  EKG showed normal sinus rhythm at 93, chest x-ray showed new consolidation in the right mid lower lung zone.  She was febrile to 103.3 in ED with white cell count of 21.8, lactic acid normal at 0.93. She started on empiric antibiotics with Rocephin and doxycycline and admitted for further evaluation and management. 2/13 patient feeling significantly improved requests to walk around ward as much as possible. Encourage patient to do so but with supervision. 2/14 patient has been ambulating around work all day without adverse effect. Pedal edema resolving. Edematous arms still bothering patient. Patient also complains of red cheeks somewhat sensitive to palpation.   Objective: Filed Vitals:   09/14/13 0501 09/14/13 0850 09/14/13 0940 09/14/13  1530  BP: 137/87 114/74  116/70  Pulse: 87 93  82  Temp: 98.3 F (36.8 C) 98.2 F (36.8 C)  98.1 F (36.7 C)  TempSrc: Oral Oral  Oral  Resp:  18     Height:      Weight:      SpO2: 98% 95% 95% 96%    Intake/Output Summary (Last 24 hours) at 09/14/13 2000 Last data filed at 09/14/13 1731  Gross per 24 hour  Intake    850 ml  Output      0 ml  Net    850 ml   Filed Weights   09/09/13 1529 09/10/13 1255  Weight: 55.1 kg (121 lb 7.6 oz) 56.9 kg (125 lb 7.1 oz)    Exam:   General:  A./O. x4, NAD  Cardiovascular: Regular rate,  regular rhythm negative murmurs rubs gallops,  Respiratory: Diffuse rhonchi but improving  Abdomen: Soft, nontender, nondistended, plus bowel sound  Musculoskeletal: Negative pedal edema, bilateral upper strandy edema improving   Skin; patient with red cheeks mildly sensitive most likely secondary to doxycycline  Data Reviewed: Basic Metabolic Panel:  Recent Labs Lab 09/09/13 1755 09/10/13 0330 09/11/13 0403 09/13/13 0432 09/14/13 0450  NA 135* 136* 136* 137 142  K 3.5* 3.1* 3.9 3.9 3.4*  CL 97 99 102 101 104  CO2 $Re'28 30 27 24 28  'ykC$ GLUCOSE 124* 100* 106* 183* 92  BUN 15 12 5* <3* 3*  CREATININE 0.74 0.70 0.64 0.55 0.58  CALCIUM 7.5* 7.1* 7.6* 8.5 8.7  MG  --   --   --  1.5 1.5   Liver Function Tests:  Recent Labs Lab 09/09/13 1142 09/13/13 0432 09/14/13 0450  AST $Re'15 21 13  'MnV$ ALT $R'11 23 16  'Xe$ ALKPHOS 96 121* 98  BILITOT 0.8 0.2* <0.2*  PROT 5.6* 4.9* 4.6*  ALBUMIN 2.5* 2.0* 2.0*   No results found for this basename: LIPASE, AMYLASE,  in the last 168 hours No results found for this basename: AMMONIA,  in the last 168 hours CBC:  Recent Labs Lab 09/09/13 1142 09/10/13 0330 09/11/13 0403 09/13/13 0432 09/14/13 0450  WBC 21.8* 14.6* 12.9* 5.2 8.9  NEUTROABS 19.5*  --   --  4.4 5.4  HGB 12.1 10.7* 11.1* 11.6* 10.8*  HCT 37.3 33.8* 35.9* 36.6 34.1*  MCV 80.0 81.8 82.0 80.8 80.4  PLT 328 315 320 333 433*   Cardiac Enzymes: No results found for this basename: CKTOTAL, CKMB, CKMBINDEX, TROPONINI,  in the last 168 hours BNP (last 3 results)  Recent Labs   02/25/13 1108  PROBNP 185.0*   CBG: No results found for this basename: GLUCAP,  in the last 168 hours  Recent Results (from the past 240 hour(s))  URINE CULTURE     Status: None   Collection Time    09/09/13 12:14 PM      Result Value Ref Range Status   Specimen Description URINE, RANDOM   Final   Special Requests NONE   Final   Culture  Setup Time     Final   Value: 09/09/2013 21:01     Performed at [place]     Final   Value: >=100,000 COLONIES/ML     Performed at [facility]   Culture     Final   Value: ESCHERICHIA COLI     Performed at [facility]   Report Status 09/11/2013 FINAL   Final   Organism ID, Bacteria  ESCHERICHIA COLI   Final  RESPIRATORY VIRUS PANEL     Status: None   Collection Time    09/09/13 12:23 PM      Result Value Ref Range Status   Source - [facility]AN NASAL SWAB   Corrected   Comment: CORRECTED ON 02/10 AT 1843: PREVIOUSLY REPORTED AS NASAL SWAB   Respiratory Syncytial Virus A NOT DETECTED   Final   Respiratory Syncytial Virus B NOT DETECTED   Final   Influenza A NOT DETECTED   Final   Influenza B NOT DETECTED   Final   Parainfluenza 1 NOT DETECTED   Final   Parainfluenza 2 NOT DETECTED   Final   Parainfluenza 3 NOT DETECTED   Final   Metapneumovirus NOT DETECTED   Final   Rhinovirus NOT DETECTED   Final   Adenovirus NOT DETECTED   Final   Influenza A H1 NOT DETECTED   Final   Influenza A H3 NOT DETECTED   Final   Comment: (NOTE)           Normal Reference Range for each Analyte: NOT DETECTED     Testing performed using the Luminex xTAG Respiratory Viral Panel test     kit.     This test was developed and its performance characteristics determined     by [facility]. It has not been cleared or approved by the [place]     Food and Drug Administration. This test is used for clinical purposes.     It should not be regarded as investigational or for research. This     laboratory is certified under the  [place] (CLIA) as qualified to perform high complexity     clinical laboratory testing.     Performed at [place] PCR SCREENING     Status: None   Collection Time    09/09/13  3:21 PM      Result Value Ref Range Status   MRSA by PCR NEGATIVE  NEGATIVE Final   Comment:            The GeneXpert MRSA Assay (FDA     approved for NASAL specimens     only), is one component of a     comprehensive MRSA colonization     surveillance program. It is not     intended to diagnose MRSA     infection nor to guide or     monitor treatment for     MRSA infections.  CULTURE, BLOOD (ROUTINE X 2)     Status: None   Collection Time    09/09/13  5:55 PM      Result Value Ref Range Status   Specimen Description BLOOD RIGHT ARM   Final   Special Requests BOTTLES DRAWN AEROBIC AND ANAEROBIC 10CC   Final   Culture  Setup Time     Final   Value: 09/10/2013 00:18     Performed at [facility]   Culture     Final   Value:        BLOOD CULTURE RECEIVED NO GROWTH TO DATE CULTURE WILL BE HELD FOR 5 DAYS BEFORE ISSUING A FINAL NEGATIVE REPORT     Performed at [facility]   Report Status PENDING   Incomplete  CULTURE, BLOOD (ROUTINE X 2)     Status: None   Collection Time    09/09/13  6:05 PM  Result Value Ref Range Status   Specimen Description BLOOD RIGHT ARM   Final   Special Requests BOTTLES DRAWN AEROBIC ONLY 1CC   Final   Culture  Setup Time     Final   Value: 09/10/2013 00:19     Performed at [facility]   Culture     Final   Value:        BLOOD CULTURE RECEIVED NO GROWTH TO DATE CULTURE WILL BE HELD FOR 5 DAYS BEFORE ISSUING A FINAL NEGATIVE REPORT     Performed at [facility]   Report Status PENDING   Incomplete  CULTURE, EXPECTORATED SPUTUM-ASSESSMENT     Status: None   Collection Time    09/09/13  6:25 PM      Result Value Ref Range Status   Specimen Description SPUTUM   Final   Special  Requests NONE   Final   Sputum evaluation     Final   Value: THIS SPECIMEN IS ACCEPTABLE. RESPIRATORY CULTURE REPORT TO FOLLOW.   Report Status 09/09/2013 FINAL   Final  CULTURE, RESPIRATORY (NON-EXPECTORATED)     Status: None   Collection Time    09/09/13  7:41 PM      Result Value Ref Range Status   Specimen Description SPUTUM   Final   Special Requests NONE   Final   Gram Stain     Final   Value: FEW WBC PRESENT, PREDOMINANTLY PMN     RARE SQUAMOUS EPITHELIAL CELLS PRESENT     FEW GRAM POSITIVE COCCI     IN PAIRS IN CHAINS RARE GRAM NEGATIVE RODS     Performed at [facility]   Culture     Final   Value: NORMAL OROPHARYNGEAL FLORA     Performed at [facility]   Report Status 09/12/2013 FINAL   Final  CLOSTRIDIUM DIFFICILE BY PCR     Status: None   Collection Time    09/09/13 10:00 PM      Result Value Ref Range Status   C difficile by pcr NEGATIVE  NEGATIVE Final   Comment: Performed at [place]     Status: None   Collection Time    09/09/13 10:00 PM      Result Value Ref Range Status   Specimen Description STOOL   Final   Special Requests NONE   Final   Culture     Final   Value: NO SALMONELLA, SHIGELLA, CAMPYLOBACTER, YERSINIA, OR E.COLI 0157:H7 ISOLATED     Performed at [facility]   Report Status 09/13/2013 FINAL   Final     Studies: Ct Chest W Contrast  09/12/2013   CLINICAL DATA:  Shortness of breath with abnormal chest x-ray  EXAM: CT CHEST WITH CONTRAST  TECHNIQUE: Multidetector CT imaging of the chest was performed during intravenous contrast administration.  CONTRAST:  69mL OMNIPAQUE IOHEXOL 300 MG/ML  SOLN  COMPARISON:  Chest x-ray September 09, 2013  FINDINGS: There is no mediastinal or hilar lymphadenopathy. The heart size is enlarged. There is no pericardial effusion. There is a gastric pull-through with a stent in place. There are small bilateral pleural effusions. There are patchy airspace opacity involving  the right middle lobe correlating to the chest x-ray finding. There are patchy air space consolidation of bilateral lower lobes. The visualized upper abdominal structures unremarkable. There is question parapelvic cyst in the superior left kidney, incompletely included.  IMPRESSION: There are patchy airspace opacities involving  the right middle lobe correlating chest x-ray finding, consistent with pneumonia. No focal discrete pulmonary mass is noted. There patchy airspace consolidation of bilateral lobes consistent with pneumonias. Small bilateral pleural effusions.   Electronically Signed   By: Abelardo Diesel M.D.   On: 09/12/2013 21:54    Scheduled Meds: . arformoterol  15 mcg Nebulization BID  . B-complex with vitamin C  1 tablet Oral BID  . budesonide  0.25 mg Nebulization BID  . calcium-vitamin D  1 tablet Oral TID  . cefTRIAXone (ROCEPHIN)  IV  1 g Intravenous Q24H  . dextromethorphan-guaiFENesin  1 tablet Oral q morning - 10a  . digoxin  0.125 mg Oral Daily  . doxycycline  100 mg Oral Q12H  . enoxaparin (LOVENOX) injection  1 mg/kg Subcutaneous Q12H  . guaiFENesin-codeine  10 mL Oral QHS  . lansoprazole  60 mg Oral BID  . metoCLOPramide  5 mg Oral BID  . sucralfate  1 g Oral BID  . Warfarin - Pharmacist Dosing Inpatient   Does not apply q1800   Continuous Infusions:    Active Problems:   GERD   Gastric outlet obstruction   CAP (community acquired pneumonia)   Sepsis   PNA (pneumonia)   A-fib   Unspecified asthma(493.90)   Hematemesis   Acute blood loss anemia    Time spent: 40 minute   WOODS, [place], J  [facility] Pager [phone]. If 7PM-7AM, please contact night-coverage at [email or web address], password [facility] 09/14/2013, 8:00 PM  LOS: 5 days

## 2013-09-14 NOTE — Progress Notes (Signed)
Per MD, Pt not ready for d/c today.  Bernita Raisin, [place] Work [phone]

## 2013-09-15 DIAGNOSIS — R609 Edema, unspecified: Secondary | ICD-10-CM

## 2013-09-15 DIAGNOSIS — I272 Pulmonary hypertension, unspecified: Secondary | ICD-10-CM | POA: Diagnosis present

## 2013-09-15 DIAGNOSIS — I509 Heart failure, unspecified: Secondary | ICD-10-CM

## 2013-09-15 DIAGNOSIS — I2789 Other specified pulmonary heart diseases: Secondary | ICD-10-CM

## 2013-09-15 DIAGNOSIS — I5032 Chronic diastolic (congestive) heart failure: Secondary | ICD-10-CM

## 2013-09-15 LAB — COMPREHENSIVE METABOLIC PANEL
ALT: 15 U/L (ref 0–35)
AST: 13 U/L (ref 0–37)
Albumin: 2.1 g/dL — ABNORMAL LOW (ref 3.5–5.2)
Alkaline Phosphatase: 94 U/L (ref 39–117)
BUN: <3 mg/dL — ABNORMAL LOW (ref 6–23)
CALCIUM: 8.4 mg/dL (ref 8.4–10.5)
CO2: 29 meq/L (ref 19–32)
Chloride: 102 mEq/L (ref 96–112)
Creatinine, Ser: 0.56 mg/dL (ref 0.50–1.10)
GFR calc Af Amer: >90 mL/min (ref >90)
GFR, EST NON AFRICAN AMERICAN: 85 mL/min — AB (ref >90)
Glucose, Bld: 99 mg/dL (ref 70–99)
POTASSIUM: 3.2 meq/L — AB (ref 3.7–5.3)
SODIUM: 140 meq/L (ref 137–147)
Total Bilirubin: <0.2 mg/dL — ABNORMAL LOW (ref 0.3–1.2)
Total Protein: 4.5 g/dL — ABNORMAL LOW (ref 6.0–8.3)

## 2013-09-15 LAB — CBC WITH DIFFERENTIAL/PLATELET
BASOS PCT: 0 % (ref 0–1)
Basophils Absolute: 0 10*3/uL (ref 0.0–0.1)
EOS ABS: 0.2 10*3/uL (ref 0.0–0.7)
Eosinophils Relative: 2 % (ref 0–5)
HEMATOCRIT: 34.6 % — AB (ref 36.0–46.0)
Hemoglobin: 10.8 g/dL — ABNORMAL LOW (ref 12.0–15.0)
Lymphocytes Relative: 28 % (ref 12–46)
Lymphs Abs: 2 10*3/uL (ref 0.7–4.0)
MCH: 25.2 pg — AB (ref 26.0–34.0)
MCHC: 31.2 g/dL (ref 30.0–36.0)
MCV: 80.7 fL (ref 78.0–100.0)
MONO ABS: 0.8 10*3/uL (ref 0.1–1.0)
MONOS PCT: 11 % (ref 3–12)
NEUTROS PCT: 59 % (ref 43–77)
Neutro Abs: 4.3 10*3/uL (ref 1.7–7.7)
Platelets: 383 10*3/uL (ref 150–400)
RBC: 4.29 MIL/uL (ref 3.87–5.11)
RDW: 16.3 % — ABNORMAL HIGH (ref 11.5–15.5)
WBC: 7.3 10*3/uL (ref 4.0–10.5)

## 2013-09-15 LAB — MAGNESIUM: MAGNESIUM: 1.4 mg/dL — AB (ref 1.5–2.5)

## 2013-09-15 LAB — PROTIME-INR
INR: 1.62 — ABNORMAL HIGH (ref 0.00–1.49)
Prothrombin Time: 18.8 seconds — ABNORMAL HIGH (ref 11.6–15.2)

## 2013-09-15 MED ORDER — POTASSIUM CHLORIDE CRYS ER 10 MEQ PO TBCR
30.0000 meq | EXTENDED_RELEASE_TABLET | Freq: Once | ORAL | Status: AC
  Administered 2013-09-15: 30 meq via ORAL
  Filled 2013-09-15 (×2): qty 1

## 2013-09-15 MED ORDER — FUROSEMIDE 40 MG PO TABS
40.0000 mg | ORAL_TABLET | Freq: Once | ORAL | Status: AC
  Administered 2013-09-15: 40 mg via ORAL
  Filled 2013-09-15 (×2): qty 1

## 2013-09-15 MED ORDER — WARFARIN SODIUM 2.5 MG PO TABS
2.5000 mg | ORAL_TABLET | Freq: Once | ORAL | Status: AC
  Administered 2013-09-15: 2.5 mg via ORAL
  Filled 2013-09-15: qty 1

## 2013-09-15 MED ORDER — MAGNESIUM OXIDE 400 (241.3 MG) MG PO TABS
200.0000 mg | ORAL_TABLET | Freq: Two times a day (BID) | ORAL | Status: DC
  Administered 2013-09-15 – 2013-09-17 (×5): 200 mg via ORAL
  Filled 2013-09-15 (×6): qty 0.5

## 2013-09-15 NOTE — Progress Notes (Signed)
ANTICOAGULATION CONSULT NOTE - Follow Up  Pharmacy Consult for Lovenox and Coumadin Indication: atrial fibrillation  Allergies  Allergen Reactions  . Erythromycin Ethylsuccinate     Irregular pulse rate  . Prednisone     "makes me hyper"    Patient Measurements: Height: 5' (152.4 cm) Weight: 131 lb 8 oz (59.648 kg) IBW/kg (Calculated) : 45.5  Vital Signs: Temp: 98 F (36.7 C) (02/15 0453) Temp src: Oral (02/15 0453) BP: 130/79 mmHg (02/15 0453) Pulse Rate: 85 (02/15 0453)  Labs:  Recent Labs  09/13/13 0432 09/14/13 0450 09/15/13 0435  HGB 11.6* 10.8* 10.8*  HCT 36.6 34.1* 34.6*  PLT 333 433* 383  LABPROT 14.6 20.5* 18.8*  INR 1.16 1.82* 1.62*  CREATININE 0.55 0.58 0.56    Estimated Creatinine Clearance: 43.7 ml/min (by C-G formula based on Cr of 0.56).   Medical History: Past Medical History  Diagnosis Date  . GERD (gastroesophageal reflux disease)   . Osteoporosis   . Postmenopausal HRT (hormone replacement therapy)   . Anemia   . Hypertension   . Allergy   . Asthma   . Shortness of breath   . PONV (postoperative nausea and vomiting)   . Pneumonia      Hx of pneumonia,34months ago  . Dysrhythmia     a fib  . Headache(784.0)   . Esophageal cancer     removed esophagus stomach pulled up    Medications:  Prescriptions prior to admission  Medication Sig Dispense Refill  . acetaminophen (TYLENOL) 500 MG tablet Take 500 mg by mouth daily as needed for headache.      . albuterol (PROVENTIL HFA;VENTOLIN HFA) 108 (90 BASE) MCG/ACT inhaler Inhale 2 puffs into the lungs every 6 (six) hours as needed. For wheezing  1 Inhaler  11  . arformoterol (BROVANA) 15 MCG/2ML NEBU Take 2 mLs (15 mcg total) by nebulization 2 (two) times daily. Dx: 493.00  120 mL  12  . atenolol-chlorthalidone (TENORETIC) 50-25 MG per tablet Take 1 tablet by mouth daily.      . budesonide (PULMICORT) 0.25 MG/2ML nebulizer solution Take 2 mLs (0.25 mg total) by nebulization 2 (two) times  daily. Dx: 493.00  120 mL  12  . Calcium-Vitamin D-Vitamin K (VIACTIV) 161-096-04 MG-UNT-MCG CHEW Chew by mouth 3 (three) times daily.        . chlorpheniramine-HYDROcodone (TUSSIONEX PENNKINETIC ER) 10-8 MG/5ML LQCR Take 5 mLs by mouth every 12 (twelve) hours as needed for cough (sedation caution).  120 mL  0  . digoxin (LANOXIN) 0.125 MG tablet Take 1 tablet (0.125 mg total) by mouth daily.  90 tablet  3  . Fe Fum-FA-B Cmp-C-Zn-Mg-Mn-Cu (HEMOCYTE PLUS) 106-1 MG CAPS Take 106 mg by mouth 2 (two) times daily.  180 each  3  . lansoprazole (PREVACID SOLUTAB) 30 MG disintegrating tablet Take 2 tablets (60 mg total) by mouth 2 (two) times daily.  360 tablet  3  . levocetirizine (XYZAL) 5 MG tablet Take 5 mg by mouth every evening. Patient takes only as needed      . metoCLOPramide (REGLAN) 5 MG tablet Take 5 mg by mouth 2 (two) times daily.      . mometasone-formoterol (DULERA) 100-5 MCG/ACT AERO Take 2 puffs first thing in am and then another 2 puffs about 12 hours later.  1 Inhaler  11  . Olopatadine HCl (PATADAY) 0.2 % SOLN Apply 1-2 drops to eye 2 (two) times daily as needed (dry, itchy eyes.).      Marland Kitchen  phenol (CHLORASEPTIC) 1.4 % LIQD Use as directed 1 spray in the mouth or throat as needed for throat irritation / pain.      . promethazine (PHENERGAN) 25 MG suppository Place 25 mg rectally every 6 (six) hours as needed for nausea or vomiting.      . sucralfate (CARAFATE) 1 GM/10ML suspension Take 10 mLs (1 g total) by mouth 2 (two) times daily.  1800 mL  3  . traMADol (ULTRAM) 50 MG tablet Take 50 mg by mouth every 6 (six) hours as needed for moderate pain.      . Vitamin D, Ergocalciferol, (DRISDOL) 50000 UNITS CAPS Take 1 capsule (50,000 Units total) by mouth every 7 (seven) days. On Friday  30 capsule  3  . warfarin (COUMADIN) 5 MG tablet Take 5 mg by mouth daily.       Marland Kitchen zolpidem (AMBIEN) 5 MG tablet Take 1 tablet (5 mg total) by mouth at bedtime as needed for sleep.  90 tablet  1   Scheduled:   . arformoterol  15 mcg Nebulization BID  . B-complex with vitamin C  1 tablet Oral BID  . budesonide  0.25 mg Nebulization BID  . calcium-vitamin D  1 tablet Oral TID  . cefTRIAXone (ROCEPHIN)  IV  1 g Intravenous Q24H  . dextromethorphan-guaiFENesin  1 tablet Oral q morning - 10a  . digoxin  0.125 mg Oral Daily  . enoxaparin (LOVENOX) injection  1 mg/kg Subcutaneous Q12H  . guaiFENesin-codeine  10 mL Oral QHS  . lansoprazole  60 mg Oral BID  . magnesium oxide  200 mg Oral BID  . metoCLOPramide  5 mg Oral BID  . sucralfate  1 g Oral BID  . Warfarin - Pharmacist Dosing Inpatient   Does not apply q1800    Assessment: 80yo F on chronic Coumadin for A.fib at dose of 5mg  daily was admitted 2/9 with fever, productive cough, and diarrhea. On 2/5 Coumadin was held d/t hematemesis. EGD on 2/11 showed no gross bleeding. Pharmacy is asked to resume anticoagulation with Lovenox bridging to Coumadin.   Home dose coumadin: 5mg  daily.    INR today = 1.62 after coumadin doses: 6mg  (2/12 at midnight), 2.5mg  (2/13), 1mg  (2/14)  Small decrease after reduced dose last night.   CBC stable. No bleeding reported/documented.  SCr is wnl, CrCl ~66ml/min.  Doxycycline and Rocephin can increase sensitivity to Coumadin.  Goal of Therapy:  INR 2-3 Monitor platelets by anticoagulation protocol: Yes   Plan:  Continue Lovenox 55mg  SQ q12 until INR > 2 x 48 hours.   Coumadin 2.5 mg po x 1 tonight.  F/u daily PT/INR   Ralene Bathe, PharmD, BCPS 09/15/2013, 12:11 PM  Pager: [phone]

## 2013-09-15 NOTE — Progress Notes (Signed)
Per MD, Pt not ready for d/c today.  Weekday CSW to follow.  Bernita Raisin, [place] Work [phone]

## 2013-09-15 NOTE — Progress Notes (Signed)
[facility] PROGRESS NOTE  NATALYA [name] RJJ:[id number] DOB: Dec 30, 1928 DOA: 09/09/2013 PCP: Georgetta Haber, MD  Assessment/Plan:  CAP (community acquired pneumonia)  -Continue empiric antibiotics with Rocephin and doxycycline  -Leukocytosis resolved  -C. difficile negative and influenza PCR also negative. Follow up in culture  -She has had some aspiration in the past secondary to the GI problems listed below; speech therapy has evaluated patient and believes she has a good regimen to prevent her from aspirating no further eval required -Continue diet regular  -Leukocytosis has resolved. DC antibiotics  on 2/15  Sepsis syndrome  -Patient drinking/eating DC IV and telemetry   -Improving clinically as above   GERD  -Continue outpatient medications   Gastric outlet obstruction  -s/p stents in the past.  -Followed by GI/Dr. Deatra Ina   Asthma/COPD  -Stable, continue outpatient medications   History of hematemesis  -Followed by Dr. Deatra Ina  -Hemoglobin stable   History of atrial fibrillation -with chronic anticoagulation/Coumadin held since Thursday 2/5  -She is in normal sinus rhythm at this time, when necessary IV metoprolol ordered>> follow.  -restarted Coumadin per pharmacy on 2/12, will cover with Lovenox until therapeutic -2/14  INR= 1. 62  Diastolic CHF -Patient currently not on a regimen for CHF; will need to monitor her weight daily and place her on a PRN schedule of Lasix to be monitored by her PCP  -If patient's BP increase to the point cane and CHF medication would add Coreg, lisinopril, Imdur were by themselves or in combination.  Pulmonary hypertension -See diastolic CHF   Hypokalemia  -K-Dur 30 meq x1    Red-faced -DC doxycycline 2/15; Most likely secondary to doxycycline. Patient has received last dose today  Upper strandy edema -Secondary to fluid overload will apply bilateral ACE wraps -Lasix 40 mg x1   Code Status: Full  Family  Communication:   Disposition Plan: Transfer to telemetry   Consultants:   Procedures: CT chest with contrast 09/12/2013 There are patchy airspace opacities involving the right middle lobe  correlating chest x-ray finding, consistent with pneumonia. No focal  discrete pulmonary mass is noted. There patchy airspace  consolidation of bilateral lobes consistent with pneumonias. Small  bilateral pleural effusions  Echocardiogram 11/28/2011 - Left ventricle: The cavity size was normal. Mild concentric hypertrophy. Systolic function was normal.  -LVEF=  55%-60%.  -(grade 2 diastolic dysfunction). - Aortic valve: Moderate diffuse thickening and calcification. Moderate regurgitation. - Mitral valve: Mild regurgitation. - Pulmonary arteries: PA peak pressure: 27mm Hg (S).     Antibiotics: Rocephin 2/8>> 2/14 Doxycycline 2/8>> 2/15    HPI/Subjective: [name] is a 80 y.o.WF PMHx HTN, asthma with bronchitis, iron deficiency anemia, gastric outlet obstruction status post stents in the past, atrial fibrillation on chronic Coumadin, hematemesis followed by Dr. Deatra Ina, history of aspiration who presents with above complaints. She states that she has had worsening of her cough in the past one week, along with subjective fevers up. She states that the cough is productive of dark yellowish sputum. Also patient states that she she had diarrhea in the past week-nonbloody, but that she's not had any in the past 2 days. She states that 4-5 days ago she was having some hematemesis again and so went to Dr. Kelby Fam office and was asked to hold off her Coumadin and she's taken it since then. She denies chest pain and no leg swelling. She states she's not had any further hematemesis or diarrhea today. She was seen in the ED  EKG  showed normal sinus rhythm at 93, chest x-ray showed new consolidation in the right mid lower lung zone.  She was febrile to 103.3 in ED with white cell count of 21.8,  lactic acid normal at 0.93. She started on empiric antibiotics with Rocephin and doxycycline and admitted for further evaluation and management. 2/13 patient feeling significantly improved requests to walk around ward as much as possible. Encourage patient to do so but with supervision. 2/14 patient has been ambulating around work all day without adverse effect. Pedal edema resolving. Edematous arms still bothering patient. Patient also complains of red cheeks somewhat sensitive to palpation. 2/15 patient has been ambulating around the ward without adverse affects, does complain of left side pain which appears to be a muscle cramp.   Objective: Filed Vitals:   09/14/13 0940 09/14/13 1530 09/14/13 2124 09/15/13 0453  BP:  116/70 128/73 130/79  Pulse:  82 92 85  Temp:  98.1 F (36.7 C) 98.1 F (36.7 C) 98 F (36.7 C)  TempSrc:  Oral Oral Oral  Resp:   18 18  Height:      Weight:    59.648 kg (131 lb 8 oz)  SpO2: 95% 96% 95% 96%    Intake/Output Summary (Last 24 hours) at 09/15/13 0807 Last data filed at 09/15/13 0700  Gross per 24 hour  Intake    970 ml  Output      0 ml  Net    970 ml   Filed Weights   09/09/13 1529 09/10/13 1255 09/15/13 0453  Weight: 55.1 kg (121 lb 7.6 oz) 56.9 kg (125 lb 7.1 oz) 59.648 kg (131 lb 8 oz)    Exam:   General:  A./O. x4, NAD  Cardiovascular: Regular rate,  regular rhythm negative murmurs rubs gallops,  Respiratory: Diffuse rhonchi but improving  Abdomen: Soft, nontender, nondistended, plus bowel sound  Musculoskeletal: Negative pedal edema, bilateral upper strandy edema improving   Skin; patient with red cheeks mildly sensitive most likely secondary to doxycycline (improving)  Data Reviewed: Basic Metabolic Panel:  Recent Labs Lab 09/10/13 0330 09/11/13 0403 09/13/13 0432 09/14/13 0450 09/15/13 0435  NA 136* 136* 137 142 140  K 3.1* 3.9 3.9 3.4* 3.2*  CL 99 102 101 104 102  CO2 $Re'30 27 24 28 29  'pHI$ GLUCOSE 100* 106* 183* 92 99   BUN 12 5* <3* 3* <3*  CREATININE 0.70 0.64 0.55 0.58 0.56  CALCIUM 7.1* 7.6* 8.5 8.7 8.4  MG  --   --  1.5 1.5 1.4*   Liver Function Tests:  Recent Labs Lab 09/09/13 1142 09/13/13 0432 09/14/13 0450 09/15/13 0435  AST $Re'15 21 13 13  'RAO$ ALT $R'11 23 16 15  'mW$ ALKPHOS 96 121* 98 94  BILITOT 0.8 0.2* <0.2* <0.2*  PROT 5.6* 4.9* 4.6* 4.5*  ALBUMIN 2.5* 2.0* 2.0* 2.1*   No results found for this basename: LIPASE, AMYLASE,  in the last 168 hours No results found for this basename: AMMONIA,  in the last 168 hours CBC:  Recent Labs Lab 09/09/13 1142 09/10/13 0330 09/11/13 0403 09/13/13 0432 09/14/13 0450 09/15/13 0435  WBC 21.8* 14.6* 12.9* 5.2 8.9 7.3  NEUTROABS 19.5*  --   --  4.4 5.4 4.3  HGB 12.1 10.7* 11.1* 11.6* 10.8* 10.8*  HCT 37.3 33.8* 35.9* 36.6 34.1* 34.6*  MCV 80.0 81.8 82.0 80.8 80.4 80.7  PLT 328 315 320 333 433* 383   Cardiac Enzymes: No results found for this basename: CKTOTAL, CKMB, CKMBINDEX, TROPONINI,  in  the last 168 hours BNP (last 3 results)  Recent Labs  02/25/13 1108  PROBNP 185.0*   CBG: No results found for this basename: GLUCAP,  in the last 168 hours  Recent Results (from the past 240 hour(s))  URINE CULTURE     Status: None   Collection Time    09/09/13 12:14 PM      Result Value Ref Range Status   Specimen Description URINE, RANDOM   Final   Special Requests NONE   Final   Culture  Setup Time     Final   Value: 09/09/2013 21:01     Performed at [place]     Final   Value: >=100,000 COLONIES/ML     Performed at [facility]   Culture     Final   Value: ESCHERICHIA COLI     Performed at [facility]   Report Status 09/11/2013 FINAL   Final   Organism ID, Bacteria ESCHERICHIA COLI   Final  RESPIRATORY VIRUS PANEL     Status: None   Collection Time    09/09/13 12:23 PM      Result Value Ref Range Status   Source - RVPAN NASAL SWAB   Corrected   Comment: CORRECTED ON 02/10 AT 1843:  PREVIOUSLY REPORTED AS NASAL SWAB   Respiratory Syncytial Virus A NOT DETECTED   Final   Respiratory Syncytial Virus B NOT DETECTED   Final   Influenza A NOT DETECTED   Final   Influenza B NOT DETECTED   Final   Parainfluenza 1 NOT DETECTED   Final   Parainfluenza 2 NOT DETECTED   Final   Parainfluenza 3 NOT DETECTED   Final   Metapneumovirus NOT DETECTED   Final   Rhinovirus NOT DETECTED   Final   Adenovirus NOT DETECTED   Final   Influenza A H1 NOT DETECTED   Final   Influenza A H3 NOT DETECTED   Final   Comment: (NOTE)           Normal Reference Range for each Analyte: NOT DETECTED     Testing performed using the Luminex xTAG Respiratory Viral Panel test     kit.     This test was developed and its performance characteristics determined     by [facility]. It has not been cleared or approved by the [place]     Food and Drug Administration. This test is used for clinical purposes.     It should not be regarded as investigational or for research. This     laboratory is certified under the [place] (CLIA) as qualified to perform high complexity     clinical laboratory testing.     Performed at [place] PCR SCREENING     Status: None   Collection Time    09/09/13  3:21 PM      Result Value Ref Range Status   MRSA by PCR NEGATIVE  NEGATIVE Final   Comment:            The GeneXpert MRSA Assay (FDA     approved for NASAL specimens     only), is one component of a     comprehensive MRSA colonization     surveillance program. It is not     intended to diagnose MRSA     infection nor to guide or     monitor  treatment for     MRSA infections.  CULTURE, BLOOD (ROUTINE X 2)     Status: None   Collection Time    09/09/13  5:55 PM      Result Value Ref Range Status   Specimen Description BLOOD RIGHT ARM   Final   Special Requests BOTTLES DRAWN AEROBIC AND ANAEROBIC 10CC   Final   Culture  Setup Time     Final    Value: 09/10/2013 00:18     Performed at [facility]   Culture     Final   Value:        BLOOD CULTURE RECEIVED NO GROWTH TO DATE CULTURE WILL BE HELD FOR 5 DAYS BEFORE ISSUING A FINAL NEGATIVE REPORT     Performed at [facility]   Report Status PENDING   Incomplete  CULTURE, BLOOD (ROUTINE X 2)     Status: None   Collection Time    09/09/13  6:05 PM      Result Value Ref Range Status   Specimen Description BLOOD RIGHT ARM   Final   Special Requests BOTTLES DRAWN AEROBIC ONLY 1CC   Final   Culture  Setup Time     Final   Value: 09/10/2013 00:19     Performed at [facility]   Culture     Final   Value:        BLOOD CULTURE RECEIVED NO GROWTH TO DATE CULTURE WILL BE HELD FOR 5 DAYS BEFORE ISSUING A FINAL NEGATIVE REPORT     Performed at [facility]   Report Status PENDING   Incomplete  CULTURE, EXPECTORATED SPUTUM-ASSESSMENT     Status: None   Collection Time    09/09/13  6:25 PM      Result Value Ref Range Status   Specimen Description SPUTUM   Final   Special Requests NONE   Final   Sputum evaluation     Final   Value: THIS SPECIMEN IS ACCEPTABLE. RESPIRATORY CULTURE REPORT TO FOLLOW.   Report Status 09/09/2013 FINAL   Final  CULTURE, RESPIRATORY (NON-EXPECTORATED)     Status: None   Collection Time    09/09/13  7:41 PM      Result Value Ref Range Status   Specimen Description SPUTUM   Final   Special Requests NONE   Final   Gram Stain     Final   Value: FEW WBC PRESENT, PREDOMINANTLY PMN     RARE SQUAMOUS EPITHELIAL CELLS PRESENT     FEW GRAM POSITIVE COCCI     IN PAIRS IN CHAINS RARE GRAM NEGATIVE RODS     Performed at [facility]   Culture     Final   Value: NORMAL OROPHARYNGEAL FLORA     Performed at [facility]   Report Status 09/12/2013 FINAL   Final  CLOSTRIDIUM DIFFICILE BY PCR     Status: None   Collection Time    09/09/13 10:00 PM      Result Value Ref Range Status   C difficile by pcr NEGATIVE  NEGATIVE  Final   Comment: Performed at [place]     Status: None   Collection Time    09/09/13 10:00 PM      Result Value Ref Range Status   Specimen Description STOOL   Final   Special Requests NONE   Final   Culture     Final   Value: NO SALMONELLA, SHIGELLA,  CAMPYLOBACTER, YERSINIA, OR E.COLI 0157:H7 ISOLATED     Performed at [facility]   Report Status 09/13/2013 FINAL   Final     Studies: No results found.  Scheduled Meds: . arformoterol  15 mcg Nebulization BID  . B-complex with vitamin C  1 tablet Oral BID  . budesonide  0.25 mg Nebulization BID  . calcium-vitamin D  1 tablet Oral TID  . cefTRIAXone (ROCEPHIN)  IV  1 g Intravenous Q24H  . dextromethorphan-guaiFENesin  1 tablet Oral q morning - 10a  . digoxin  0.125 mg Oral Daily  . doxycycline  100 mg Oral Q12H  . enoxaparin (LOVENOX) injection  1 mg/kg Subcutaneous Q12H  . furosemide  40 mg Oral Once  . guaiFENesin-codeine  10 mL Oral QHS  . lansoprazole  60 mg Oral BID  . metoCLOPramide  5 mg Oral BID  . potassium chloride  30 mEq Oral Once  . sucralfate  1 g Oral BID  . Warfarin - Pharmacist Dosing Inpatient   Does not apply q1800   Continuous Infusions:    Active Problems:   GERD   Gastric outlet obstruction   CAP (community acquired pneumonia)   Sepsis   PNA (pneumonia)   A-fib   Unspecified asthma(493.90)   Hematemesis   Acute blood loss anemia    Time spent: 40 minute   WOODS, Elliott, J  [facility] Pager [phone]. If 7PM-7AM, please contact night-coverage at [email or web address], password [facility] 09/15/2013, 8:07 AM  LOS: 6 days

## 2013-09-16 LAB — PROTIME-INR
INR: 1.48 (ref 0.00–1.49)
PROTHROMBIN TIME: 17.5 s — AB (ref 11.6–15.2)

## 2013-09-16 LAB — CULTURE, BLOOD (ROUTINE X 2)
CULTURE: NO GROWTH
Culture: NO GROWTH

## 2013-09-16 LAB — CBC WITH DIFFERENTIAL/PLATELET
Basophils Absolute: 0 10*3/uL (ref 0.0–0.1)
Basophils Relative: 0 % (ref 0–1)
EOS ABS: 0.1 10*3/uL (ref 0.0–0.7)
Eosinophils Relative: 1 % (ref 0–5)
HCT: 33.1 % — ABNORMAL LOW (ref 36.0–46.0)
Hemoglobin: 10.6 g/dL — ABNORMAL LOW (ref 12.0–15.0)
LYMPHS ABS: 2.2 10*3/uL (ref 0.7–4.0)
Lymphocytes Relative: 23 % (ref 12–46)
MCH: 25.5 pg — AB (ref 26.0–34.0)
MCHC: 32 g/dL (ref 30.0–36.0)
MCV: 79.8 fL (ref 78.0–100.0)
Monocytes Absolute: 1 10*3/uL (ref 0.1–1.0)
Monocytes Relative: 11 % (ref 3–12)
NEUTROS ABS: 6 10*3/uL (ref 1.7–7.7)
NEUTROS PCT: 65 % (ref 43–77)
PLATELETS: 367 10*3/uL (ref 150–400)
RBC: 4.15 MIL/uL (ref 3.87–5.11)
RDW: 16.4 % — ABNORMAL HIGH (ref 11.5–15.5)
WBC: 9.3 10*3/uL (ref 4.0–10.5)

## 2013-09-16 LAB — COMPREHENSIVE METABOLIC PANEL
ALT: 12 U/L (ref 0–35)
AST: 14 U/L (ref 0–37)
Albumin: 2 g/dL — ABNORMAL LOW (ref 3.5–5.2)
Alkaline Phosphatase: 89 U/L (ref 39–117)
BILIRUBIN TOTAL: 0.4 mg/dL (ref 0.3–1.2)
CHLORIDE: 99 meq/L (ref 96–112)
CO2: 31 mEq/L (ref 19–32)
Calcium: 7.9 mg/dL — ABNORMAL LOW (ref 8.4–10.5)
Creatinine, Ser: 0.61 mg/dL (ref 0.50–1.10)
GFR calc non Af Amer: 82 mL/min — ABNORMAL LOW (ref >90)
Glucose, Bld: 100 mg/dL — ABNORMAL HIGH (ref 70–99)
Potassium: 3.9 mEq/L (ref 3.7–5.3)
Sodium: 138 mEq/L (ref 137–147)
Total Protein: 4.4 g/dL — ABNORMAL LOW (ref 6.0–8.3)

## 2013-09-16 LAB — MAGNESIUM: Magnesium: 1.4 mg/dL — ABNORMAL LOW (ref 1.5–2.5)

## 2013-09-16 MED ORDER — WARFARIN SODIUM 5 MG PO TABS
5.0000 mg | ORAL_TABLET | Freq: Once | ORAL | Status: AC
  Administered 2013-09-16: 5 mg via ORAL
  Filled 2013-09-16: qty 1

## 2013-09-16 MED ORDER — BISACODYL 10 MG RE SUPP
10.0000 mg | Freq: Once | RECTAL | Status: AC
  Administered 2013-09-16: 10 mg via RECTAL
  Filled 2013-09-16: qty 1

## 2013-09-16 NOTE — Progress Notes (Signed)
Received from 1430 via bed. Reviewed 2000 assessment. Pt assessment unchanged from earlier. Will continue to assess and provide care as needed.

## 2013-09-16 NOTE — Progress Notes (Signed)
CSW assisting with d/c planning. Pt remains medically unstable for d/c. [name] [place] updated . SNF bed should still be available for pt when ready for d/c. CSW will continue to follow.  Werner Lean  LCSW [phone]

## 2013-09-16 NOTE — Progress Notes (Signed)
ANTICOAGULATION CONSULT NOTE - Follow Up  Pharmacy Consult for Lovenox and Coumadin Indication: atrial fibrillation  Allergies  Allergen Reactions  . Erythromycin Ethylsuccinate     Irregular pulse rate  . Prednisone     "makes me hyper"    Patient Measurements: Height: 5' (152.4 cm) Weight: 131 lb 8 oz (59.648 kg) IBW/kg (Calculated) : 45.5  Vital Signs: Temp: 98.5 F (36.9 C) (02/16 0430) Temp src: Oral (02/16 0430) BP: 108/73 mmHg (02/16 0430) Pulse Rate: 88 (02/16 0430)  Labs:  Recent Labs  09/14/13 0450 09/15/13 0435 09/16/13 0530  HGB 10.8* 10.8* 10.6*  HCT 34.1* 34.6* 33.1*  PLT 433* 383 367  LABPROT 20.5* 18.8* 17.5*  INR 1.82* 1.62* 1.48  CREATININE 0.58 0.56 0.61    Estimated Creatinine Clearance: 43.7 ml/min (by C-G formula based on Cr of 0.61).   Medical History: Past Medical History  Diagnosis Date  . GERD (gastroesophageal reflux disease)   . Osteoporosis   . Postmenopausal HRT (hormone replacement therapy)   . Anemia   . Hypertension   . Allergy   . Asthma   . Shortness of breath   . PONV (postoperative nausea and vomiting)   . Pneumonia      Hx of pneumonia,84months ago  . Dysrhythmia     a fib  . Headache(784.0)   . Esophageal cancer     removed esophagus stomach pulled up    Medications:  Prescriptions prior to admission  Medication Sig Dispense Refill  . acetaminophen (TYLENOL) 500 MG tablet Take 500 mg by mouth daily as needed for headache.      . albuterol (PROVENTIL HFA;VENTOLIN HFA) 108 (90 BASE) MCG/ACT inhaler Inhale 2 puffs into the lungs every 6 (six) hours as needed. For wheezing  1 Inhaler  11  . arformoterol (BROVANA) 15 MCG/2ML NEBU Take 2 mLs (15 mcg total) by nebulization 2 (two) times daily. Dx: 493.00  120 mL  12  . atenolol-chlorthalidone (TENORETIC) 50-25 MG per tablet Take 1 tablet by mouth daily.      . budesonide (PULMICORT) 0.25 MG/2ML nebulizer solution Take 2 mLs (0.25 mg total) by nebulization 2 (two)  times daily. Dx: 493.00  120 mL  12  . Calcium-Vitamin D-Vitamin K (VIACTIV) 144-315-40 MG-UNT-MCG CHEW Chew by mouth 3 (three) times daily.        . chlorpheniramine-HYDROcodone (TUSSIONEX PENNKINETIC ER) 10-8 MG/5ML LQCR Take 5 mLs by mouth every 12 (twelve) hours as needed for cough (sedation caution).  120 mL  0  . digoxin (LANOXIN) 0.125 MG tablet Take 1 tablet (0.125 mg total) by mouth daily.  90 tablet  3  . Fe Fum-FA-B Cmp-C-Zn-Mg-Mn-Cu (HEMOCYTE PLUS) 106-1 MG CAPS Take 106 mg by mouth 2 (two) times daily.  180 each  3  . lansoprazole (PREVACID SOLUTAB) 30 MG disintegrating tablet Take 2 tablets (60 mg total) by mouth 2 (two) times daily.  360 tablet  3  . levocetirizine (XYZAL) 5 MG tablet Take 5 mg by mouth every evening. Patient takes only as needed      . metoCLOPramide (REGLAN) 5 MG tablet Take 5 mg by mouth 2 (two) times daily.      . mometasone-formoterol (DULERA) 100-5 MCG/ACT AERO Take 2 puffs first thing in am and then another 2 puffs about 12 hours later.  1 Inhaler  11  . Olopatadine HCl (PATADAY) 0.2 % SOLN Apply 1-2 drops to eye 2 (two) times daily as needed (dry, itchy eyes.).      Marland Kitchen  phenol (CHLORASEPTIC) 1.4 % LIQD Use as directed 1 spray in the mouth or throat as needed for throat irritation / pain.      . promethazine (PHENERGAN) 25 MG suppository Place 25 mg rectally every 6 (six) hours as needed for nausea or vomiting.      . sucralfate (CARAFATE) 1 GM/10ML suspension Take 10 mLs (1 g total) by mouth 2 (two) times daily.  1800 mL  3  . traMADol (ULTRAM) 50 MG tablet Take 50 mg by mouth every 6 (six) hours as needed for moderate pain.      . Vitamin D, Ergocalciferol, (DRISDOL) 50000 UNITS CAPS Take 1 capsule (50,000 Units total) by mouth every 7 (seven) days. On Friday  30 capsule  3  . warfarin (COUMADIN) 5 MG tablet Take 5 mg by mouth daily.       Marland Kitchen zolpidem (AMBIEN) 5 MG tablet Take 1 tablet (5 mg total) by mouth at bedtime as needed for sleep.  90 tablet  1    Scheduled:  . arformoterol  15 mcg Nebulization BID  . B-complex with vitamin C  1 tablet Oral BID  . budesonide  0.25 mg Nebulization BID  . calcium-vitamin D  1 tablet Oral TID  . cefTRIAXone (ROCEPHIN)  IV  1 g Intravenous Q24H  . dextromethorphan-guaiFENesin  1 tablet Oral q morning - 10a  . digoxin  0.125 mg Oral Daily  . enoxaparin (LOVENOX) injection  1 mg/kg Subcutaneous Q12H  . guaiFENesin-codeine  10 mL Oral QHS  . lansoprazole  60 mg Oral BID  . magnesium oxide  200 mg Oral BID  . metoCLOPramide  5 mg Oral BID  . sucralfate  1 g Oral BID  . Warfarin - Pharmacist Dosing Inpatient   Does not apply q1800    Assessment: 80yo F on chronic Coumadin for A.fib at dose of 5mg  daily was admitted 2/9 with fever, productive cough, and diarrhea. On 2/5 Coumadin was held d/t hematemesis. EGD on 2/11 showed no gross bleeding. Pharmacy is asked to resume anticoagulation with Lovenox bridging to Coumadin.   Home dose coumadin: 5mg  daily.    INR today = 1.48 and dropping after coumadin doses: 6mg  (2/12 at midnight), 2.5mg  (2/13), 1mg  (2/14), 2.5mg  (2/15)  CBC stable. No bleeding reported/documented.  SCr is wnl, CrCl ~11ml/min.  Rocephin can increase sensitivity to Coumadin.  Goal of Therapy:  INR 2-3 Monitor platelets by anticoagulation protocol: Yes   Plan:  1) Continue Lovenox 55mg  SQ q12 until INR > 2   2) Due to continued drop in INR, will increase dose of warfarin to 5mg   Adrian Saran, PharmD, BCPS Pager [phone] 09/16/2013 1:00 PM

## 2013-09-16 NOTE — Progress Notes (Signed)
Physical Therapy Treatment Patient Details Name: [name] MRN: [id number] DOB: [birth year] Today's Date: 09/16/2013 Time: 7253-6644 PT Time Calculation (min): 26 min  PT Assessment / Plan / Recommendation  History of Present Illness Pt is an 80 year old female with multiple medical problems as listed below including her, gastric outlet obstruction status post stents in the past, atrial fibrillation on chronic Coumadin, hematemesis followed by Dr. Deatra Ina, history of aspiration who presents with above complaints. She states that she has had worsening of her cough in the past one week, along with subjective fevers up. She states that the cough is productive of dark yellowish sputum. Also patient states that she she had diarrhea in the past week-nonbloody, but that she's not had any in the past 2 days. She states that 4-5 days ago she was having some hematemesis again and so went to Dr. Kelby Fam office and was asked to hold off her Coumadin and she's taken it since then. She denies chest pain and no leg swelling. She states she's not had any further hematemesis or diarrhea today. She was seen in the ED and admitted for CAP.   PT Comments   Pt very agreeable to mobilize today and also performed exercises.  Pt plans to d/c to [facility] soon.  Follow Up Recommendations  SNF     Does the patient have the potential to tolerate intense rehabilitation     Barriers to Discharge        Equipment Recommendations  None recommended by PT    Recommendations for Other Services    Frequency Min 3X/week   Progress towards PT Goals Progress towards PT goals: Progressing toward goals  Plan Current plan remains appropriate    Precautions / Restrictions Precautions Precautions: Fall   Pertinent Vitals/Pain C/o mild stomach muscles pain during activity, adjusted to tolerance, repositioned    Mobility  Bed Mobility Overal bed mobility: Modified Independent General bed mobility comments: HOB  raised and pt used rails Transfers Overall transfer level: Needs assistance Equipment used: Rolling walker (2 wheeled) Transfers: Sit to/from Stand Sit to Stand: Supervision General transfer comment: verbal cues for safety Ambulation/Gait Ambulation/Gait assistance: Supervision;Min guard Ambulation Distance (Feet): 400 Feet Assistive device: Rolling walker (2 wheeled) Gait Pattern/deviations: Step-through pattern Gait velocity: improved since previous visit General Gait Details: pt happy to be up ambulating, reports feeling much better however states her stomach muscles have been causing her pain    Exercises General Exercises - Lower Extremity Ankle Circles/Pumps: AROM;Both;15 reps;Supine Short Arc Quad: AROM;Both;15 reps Long Arc Quad: AROM;Both;15 reps;Seated Heel Slides: AROM;Both;15 reps;Supine Hip ABduction/ADduction: AROM;Both;15 reps;Supine Hip Flexion/Marching: AROM;Both;15 reps;Seated   PT Diagnosis:    PT Problem List:   PT Treatment Interventions:     PT Goals (current goals can now be found in the care plan section)    Visit Information  Last PT Received On: 09/16/13 Assistance Needed: +1 History of Present Illness: Pt is an 80 year old female with multiple medical problems as listed below including her, gastric outlet obstruction status post stents in the past, atrial fibrillation on chronic Coumadin, hematemesis followed by Dr. Deatra Ina, history of aspiration who presents with above complaints. She states that she has had worsening of her cough in the past one week, along with subjective fevers up. She states that the cough is productive of dark yellowish sputum. Also patient states that she she had diarrhea in the past week-nonbloody, but that she's not had any in the past 2 days. She states  that 4-5 days ago she was having some hematemesis again and so went to Dr. Kelby Fam office and was asked to hold off her Coumadin and she's taken it since then. She denies chest  pain and no leg swelling. She states she's not had any further hematemesis or diarrhea today. She was seen in the ED and admitted for CAP.    Subjective Data      Cognition  Cognition Arousal/Alertness: Awake/alert Behavior During Therapy: WFL for tasks assessed/performed Overall Cognitive Status: Within Functional Limits for tasks assessed    Balance     End of Session PT - End of Session Activity Tolerance: Patient tolerated treatment well Patient left: in chair;with call bell/phone within reach   GP     Dacia Capers,KATHrine E 09/16/2013, 11:02 AM Carmelia Bake, PT, DPT 09/16/2013 Pager: [phone]

## 2013-09-16 NOTE — Progress Notes (Signed)
[facility] PROGRESS NOTE  [name]:[id number] DOB: [birth year] DOA: 09/09/2013 PCP: Georgetta Haber, MD  Assessment/Plan:  CAP (community acquired pneumonia)  -Continue empiric antibiotics with Rocephin and doxycycline  -Leukocytosis resolved  -C. difficile negative and influenza PCR also negative. Follow up in culture  -She has had some aspiration in the past secondary to the GI problems listed below; speech therapy has evaluated patient and believes she has a good regimen to prevent her from aspirating no further eval required -Continue diet regular  -Leukocytosis has resolved. DC antibiotics  on 2/15   Sepsis syndrome  -Patient drinking/eating DC IV and telemetry   -Improving clinically as above  - Patient ready for discharge to SNF as soon as INR therapeutic  GERD  -Continue outpatient medications   Gastric outlet obstruction  -s/p stents in the past.  -Followed by GI/Dr. Deatra Ina   Asthma/COPD  -Stable, continue outpatient medications   History of hematemesis  -Followed by Dr. Deatra Ina  -Hemoglobin stable   History of atrial fibrillation -with chronic anticoagulation/Coumadin held since Thursday 2/5  -She is in normal sinus rhythm at this time, when necessary IV metoprolol ordered>> follow.  -restarted Coumadin per pharmacy on 2/12, will cover with Lovenox until therapeutic -2/16  INR= 1. 48 - Requested that pharmacy give patient extra dose of Coumadin 4/40   Diastolic CHF -Patient currently not on a regimen for CHF; will need to monitor her weight daily and place her on a PRN schedule of Lasix to be monitored by her PCP  -If patient's BP increase to the point cane and CHF medication would add Coreg, lisinopril, Imdur were by themselves or in combination. - Patient to wear TED hose during waking hours  Pulmonary hypertension -See diastolic CHF   Hypokalemia  -K-Dur 30 meq x1    Red-faced -DC doxycycline 2/15; Most likely secondary to  doxycycline.  - Resolving  Upper extremity edema -Secondary to fluid overload will apply bilateral ACE wraps -Lasix 40 mg x1   Code Status: Full  Family Communication:   Disposition Plan:SNF when INR therapeutic   Consultants:   Procedures: CT chest with contrast 09/12/2013 There are patchy airspace opacities involving the right middle lobe  correlating chest x-ray finding, consistent with pneumonia. No focal  discrete pulmonary mass is noted. There patchy airspace  consolidation of bilateral lobes consistent with pneumonias. Small  bilateral pleural effusions  Echocardiogram 11/28/2011 - Left ventricle: The cavity size was normal. Mild concentric hypertrophy. Systolic function was normal.  -LVEF=  55%-60%.  -(grade 2 diastolic dysfunction). - Aortic valve: Moderate diffuse thickening and calcification. Moderate regurgitation. - Mitral valve: Mild regurgitation. - Pulmonary arteries: PA peak pressure: 38mm Hg (S).     Antibiotics: Rocephin 2/8>> 2/14 Doxycycline 2/8>> 2/15    HPI/Subjective: [name] is a 80 y.o.WF PMHx HTN, asthma with bronchitis, iron deficiency anemia, gastric outlet obstruction status post stents in the past, atrial fibrillation on chronic Coumadin, hematemesis followed by Dr. Deatra Ina, history of aspiration who presents with above complaints. She states that she has had worsening of her cough in the past one week, along with subjective fevers up. She states that the cough is productive of dark yellowish sputum. Also patient states that she she had diarrhea in the past week-nonbloody, but that she's not had any in the past 2 days. She states that 4-5 days ago she was having some hematemesis again and so went to Dr. Kelby Fam office and was asked to hold off her Coumadin and she's  taken it since then. She denies chest pain and no leg swelling. She states she's not had any further hematemesis or diarrhea today. She was seen in the ED  EKG showed  normal sinus rhythm at 93, chest x-ray showed new consolidation in the right mid lower lung zone.  She was febrile to 103.3 in ED with white cell count of 21.8, lactic acid normal at 0.93. She started on empiric antibiotics with Rocephin and doxycycline and admitted for further evaluation and management. 2/13 patient feeling significantly improved requests to walk around ward as much as possible. Encourage patient to do so but with supervision. 2/14 patient has been ambulating around work all day without adverse effect. Pedal edema resolving. Edematous arms still bothering patient. Patient also complains of red cheeks somewhat sensitive to palpation. 2/15 patient has been ambulating around the ward without adverse affects, does complain of left side pain which appears to be a muscle cramp. 2/16 patient doing well continues to ambulate around ward. Still dissatisfied with her current strength level, unable to place her on legs in the bed when she sits down.   Objective: Filed Vitals:   09/16/13 0133 09/16/13 0430 09/16/13 1007 09/16/13 1344  BP: 135/84 108/73  147/87  Pulse: 97 88  89  Temp: 98.7 F (37.1 C) 98.5 F (36.9 C)  99.1 F (37.3 C)  TempSrc: Oral Oral  Oral  Resp: $Remo'16 20  18  'TPbfR$ Height:      Weight:      SpO2: 91% 93% 94% 92%    Intake/Output Summary (Last 24 hours) at 09/16/13 1946 Last data filed at 09/16/13 1709  Gross per 24 hour  Intake    720 ml  Output    650 ml  Net     70 ml   Filed Weights   09/09/13 1529 09/10/13 1255 09/15/13 0453  Weight: 55.1 kg (121 lb 7.6 oz) 56.9 kg (125 lb 7.1 oz) 59.648 kg (131 lb 8 oz)    Exam:   General:  A./O. x4, NAD  Cardiovascular: Regular rate,  regular rhythm negative murmurs rubs gallops,  Respiratory: Diffuse rhonchi but improving  Abdomen: Soft, nontender, nondistended, plus bowel sound  Musculoskeletal: Negative pedal edema, bilateral upper strandy edema improving   Skin; patient with red cheeks mildly sensitive most  likely secondary to doxycycline (resolving)  Data Reviewed: Basic Metabolic Panel:  Recent Labs Lab 09/11/13 0403 09/13/13 0432 09/14/13 0450 09/15/13 0435 09/16/13 0530  NA 136* 137 142 140 138  K 3.9 3.9 3.4* 3.2* 3.9  CL 102 101 104 102 99  CO2 $Re'27 24 28 29 31  'lfA$ GLUCOSE 106* 183* 92 99 100*  BUN 5* <3* 3* <3* <3*  CREATININE 0.64 0.55 0.58 0.56 0.61  CALCIUM 7.6* 8.5 8.7 8.4 7.9*  MG  --  1.5 1.5 1.4* 1.4*   Liver Function Tests:  Recent Labs Lab 09/13/13 0432 09/14/13 0450 09/15/13 0435 09/16/13 0530  AST $Re'21 13 13 14  'KRb$ ALT $R'23 16 15 12  'tV$ ALKPHOS 121* 98 94 89  BILITOT 0.2* <0.2* <0.2* 0.4  PROT 4.9* 4.6* 4.5* 4.4*  ALBUMIN 2.0* 2.0* 2.1* 2.0*   No results found for this basename: LIPASE, AMYLASE,  in the last 168 hours No results found for this basename: AMMONIA,  in the last 168 hours CBC:  Recent Labs Lab 09/11/13 0403 09/13/13 0432 09/14/13 0450 09/15/13 0435 09/16/13 0530  WBC 12.9* 5.2 8.9 7.3 9.3  NEUTROABS  --  4.4 5.4 4.3 6.0  HGB  11.1* 11.6* 10.8* 10.8* 10.6*  HCT 35.9* 36.6 34.1* 34.6* 33.1*  MCV 82.0 80.8 80.4 80.7 79.8  PLT 320 333 433* 383 367   Cardiac Enzymes: No results found for this basename: CKTOTAL, CKMB, CKMBINDEX, TROPONINI,  in the last 168 hours BNP (last 3 results)  Recent Labs  02/25/13 1108  PROBNP 185.0*   CBG: No results found for this basename: GLUCAP,  in the last 168 hours  Recent Results (from the past 240 hour(s))  URINE CULTURE     Status: None   Collection Time    09/09/13 12:14 PM      Result Value Ref Range Status   Specimen Description URINE, RANDOM   Final   Special Requests NONE   Final   Culture  Setup Time     Final   Value: 09/09/2013 21:01     Performed at [facility] Count     Final   Value: >=100,000 COLONIES/ML     Performed at [facility]   Culture     Final   Value: ESCHERICHIA COLI     Performed at [facility]   Report Status 09/11/2013 FINAL   Final    Organism ID, Bacteria ESCHERICHIA COLI   Final  RESPIRATORY VIRUS PANEL     Status: None   Collection Time    09/09/13 12:23 PM      Result Value Ref Range Status   Source - RVPAN NASAL SWAB   Corrected   Comment: CORRECTED ON 02/10 AT 1843: PREVIOUSLY REPORTED AS NASAL SWAB   Respiratory Syncytial Virus A NOT DETECTED   Final   Respiratory Syncytial Virus B NOT DETECTED   Final   Influenza A NOT DETECTED   Final   Influenza B NOT DETECTED   Final   Parainfluenza 1 NOT DETECTED   Final   Parainfluenza 2 NOT DETECTED   Final   Parainfluenza 3 NOT DETECTED   Final   Metapneumovirus NOT DETECTED   Final   Rhinovirus NOT DETECTED   Final   Adenovirus NOT DETECTED   Final   Influenza A H1 NOT DETECTED   Final   Influenza A H3 NOT DETECTED   Final   Comment: (NOTE)           Normal Reference Range for each Analyte: NOT DETECTED     Testing performed using the Luminex xTAG Respiratory Viral Panel test     kit.     This test was developed and its performance characteristics determined     by [facility]. It has not been cleared or approved by the [place]     Food and Drug Administration. This test is used for clinical purposes.     It should not be regarded as investigational or for research. This     laboratory is certified under the Clinical Laboratory Improvement     Amendments of 1988 (CLIA) as qualified to perform high complexity     clinical laboratory testing.     Performed at [facility]  MRSA PCR SCREENING     Status: None   Collection Time    09/09/13  3:21 PM      Result Value Ref Range Status   MRSA by PCR NEGATIVE  NEGATIVE Final   Comment:            The GeneXpert MRSA Assay (FDA     approved for NASAL specimens     only), is  one component of a     comprehensive MRSA colonization     surveillance program. It is not     intended to diagnose MRSA     infection nor to guide or     monitor treatment for     MRSA infections.  CULTURE, BLOOD (ROUTINE X 2)      Status: None   Collection Time    09/09/13  5:55 PM      Result Value Ref Range Status   Specimen Description BLOOD RIGHT ARM   Final   Special Requests BOTTLES DRAWN AEROBIC AND ANAEROBIC 10CC   Final   Culture  Setup Time     Final   Value: 09/10/2013 00:18     Performed at [facility]   Culture     Final   Value: NO GROWTH 5 DAYS     Performed at [facility]   Report Status 09/16/2013 FINAL   Final  CULTURE, BLOOD (ROUTINE X 2)     Status: None   Collection Time    09/09/13  6:05 PM      Result Value Ref Range Status   Specimen Description BLOOD RIGHT ARM   Final   Special Requests BOTTLES DRAWN AEROBIC ONLY 1CC   Final   Culture  Setup Time     Final   Value: 09/10/2013 00:19     Performed at [facility]   Culture     Final   Value: NO GROWTH 5 DAYS     Performed at [facility]   Report Status 09/16/2013 FINAL   Final  CULTURE, EXPECTORATED SPUTUM-ASSESSMENT     Status: None   Collection Time    09/09/13  6:25 PM      Result Value Ref Range Status   Specimen Description SPUTUM   Final   Special Requests NONE   Final   Sputum evaluation     Final   Value: THIS SPECIMEN IS ACCEPTABLE. RESPIRATORY CULTURE REPORT TO FOLLOW.   Report Status 09/09/2013 FINAL   Final  CULTURE, RESPIRATORY (NON-EXPECTORATED)     Status: None   Collection Time    09/09/13  7:41 PM      Result Value Ref Range Status   Specimen Description SPUTUM   Final   Special Requests NONE   Final   Gram Stain     Final   Value: FEW WBC PRESENT, PREDOMINANTLY PMN     RARE SQUAMOUS EPITHELIAL CELLS PRESENT     FEW GRAM POSITIVE COCCI     IN PAIRS IN CHAINS RARE GRAM NEGATIVE RODS     Performed at [facility]   Culture     Final   Value: NORMAL OROPHARYNGEAL FLORA     Performed at [facility]   Report Status 09/12/2013 FINAL   Final  CLOSTRIDIUM DIFFICILE BY PCR     Status: None   Collection Time    09/09/13 10:00 PM      Result Value Ref  Range Status   C difficile by pcr NEGATIVE  NEGATIVE Final   Comment: Performed at [place]     Status: None   Collection Time    09/09/13 10:00 PM      Result Value Ref Range Status   Specimen Description STOOL   Final   Special Requests NONE   Final   Culture     Final   Value: NO SALMONELLA, SHIGELLA, CAMPYLOBACTER, YERSINIA,  OR E.COLI 0157:H7 ISOLATED     Performed at [facility]   Report Status 09/13/2013 FINAL   Final     Studies: No results found.  Scheduled Meds: . arformoterol  15 mcg Nebulization BID  . B-complex with vitamin C  1 tablet Oral BID  . budesonide  0.25 mg Nebulization BID  . calcium-vitamin D  1 tablet Oral TID  . cefTRIAXone (ROCEPHIN)  IV  1 g Intravenous Q24H  . dextromethorphan-guaiFENesin  1 tablet Oral q morning - 10a  . digoxin  0.125 mg Oral Daily  . enoxaparin (LOVENOX) injection  1 mg/kg Subcutaneous Q12H  . guaiFENesin-codeine  10 mL Oral QHS  . lansoprazole  60 mg Oral BID  . magnesium oxide  200 mg Oral BID  . metoCLOPramide  5 mg Oral BID  . sucralfate  1 g Oral BID  . Warfarin - Pharmacist Dosing Inpatient   Does not apply q1800   Continuous Infusions:    Active Problems:   ANEMIA-IRON DEFICIENCY   HYPERTENSION   GERD   Asthma with bronchitis   Gastric outlet obstruction   CAP (community acquired pneumonia)   Sepsis   PNA (pneumonia)   A-fib   Unspecified asthma(493.90)   Hematemesis   Acute blood loss anemia   Chronic diastolic CHF (congestive heart failure)   Pulmonary hypertension    Time spent: 40 minute   Larra Crunkleton, [place], J  [facility] Pager [phone]. If 7PM-7AM, please contact night-coverage at [email or web address], password [facility] 09/16/2013, 7:46 PM  LOS: 7 days

## 2013-09-17 DIAGNOSIS — Z8501 Personal history of malignant neoplasm of esophagus: Secondary | ICD-10-CM

## 2013-09-17 LAB — CBC WITH DIFFERENTIAL/PLATELET
Basophils Absolute: 0 10*3/uL (ref 0.0–0.1)
Basophils Relative: 0 % (ref 0–1)
EOS PCT: 1 % (ref 0–5)
Eosinophils Absolute: 0.1 10*3/uL (ref 0.0–0.7)
HEMATOCRIT: 32.4 % — AB (ref 36.0–46.0)
Hemoglobin: 10.4 g/dL — ABNORMAL LOW (ref 12.0–15.0)
Lymphocytes Relative: 24 % (ref 12–46)
Lymphs Abs: 2 10*3/uL (ref 0.7–4.0)
MCH: 25.5 pg — ABNORMAL LOW (ref 26.0–34.0)
MCHC: 32.1 g/dL (ref 30.0–36.0)
MCV: 79.4 fL (ref 78.0–100.0)
MONO ABS: 0.9 10*3/uL (ref 0.1–1.0)
MONOS PCT: 11 % (ref 3–12)
Neutro Abs: 5.5 10*3/uL (ref 1.7–7.7)
Neutrophils Relative %: 64 % (ref 43–77)
Platelets: 364 10*3/uL (ref 150–400)
RBC: 4.08 MIL/uL (ref 3.87–5.11)
RDW: 16.4 % — AB (ref 11.5–15.5)
WBC: 8.5 10*3/uL (ref 4.0–10.5)

## 2013-09-17 LAB — COMPREHENSIVE METABOLIC PANEL
ALBUMIN: 1.9 g/dL — AB (ref 3.5–5.2)
ALK PHOS: 85 U/L (ref 39–117)
ALT: 12 U/L (ref 0–35)
AST: 13 U/L (ref 0–37)
BUN: 5 mg/dL — ABNORMAL LOW (ref 6–23)
CO2: 30 mEq/L (ref 19–32)
Calcium: 8.3 mg/dL — ABNORMAL LOW (ref 8.4–10.5)
Chloride: 100 mEq/L (ref 96–112)
Creatinine, Ser: 0.6 mg/dL (ref 0.50–1.10)
GFR calc Af Amer: >90 mL/min (ref >90)
GFR calc non Af Amer: 83 mL/min — ABNORMAL LOW (ref >90)
Glucose, Bld: 97 mg/dL (ref 70–99)
Potassium: 3.9 mEq/L (ref 3.7–5.3)
Sodium: 138 mEq/L (ref 137–147)
TOTAL PROTEIN: 4.2 g/dL — AB (ref 6.0–8.3)
Total Bilirubin: 0.4 mg/dL (ref 0.3–1.2)

## 2013-09-17 LAB — PROTIME-INR
INR: 1.53 — ABNORMAL HIGH (ref 0.00–1.49)
Prothrombin Time: 18 seconds — ABNORMAL HIGH (ref 11.6–15.2)

## 2013-09-17 LAB — MAGNESIUM: Magnesium: 1.6 mg/dL (ref 1.5–2.5)

## 2013-09-17 MED ORDER — TRAMADOL HCL 50 MG PO TABS: 50.0000 mg | ORAL_TABLET | Freq: Three times a day (TID) | ORAL | Status: DC | PRN

## 2013-09-17 MED ORDER — GUAIFENESIN-CODEINE 100-10 MG/5ML PO SOLN: 10.0000 mL | Freq: Every day | ORAL | Status: DC | PRN

## 2013-09-17 MED ORDER — MAGNESIUM OXIDE 400 (241.3 MG) MG PO TABS: 200.0000 mg | ORAL_TABLET | Freq: Two times a day (BID) | ORAL | Status: DC

## 2013-09-17 MED ORDER — DM-GUAIFENESIN ER 30-600 MG PO TB12: 1.0000 | ORAL_TABLET | Freq: Every morning | ORAL | Status: DC

## 2013-09-17 MED ORDER — WARFARIN SODIUM 5 MG PO TABS
5.0000 mg | ORAL_TABLET | Freq: Once | ORAL | Status: DC
  Filled 2013-09-17: qty 1

## 2013-09-17 NOTE — Care Management Note (Addendum)
    Page 1 of 1   09/17/2013     5:47:49 PM   CARE MANAGEMENT NOTE 09/17/2013  Patient:  MANDEEP, FERCH   Account Number:  [id number]  Date Initiated:  09/10/2013  Documentation initiated by:  Gabriel Earing  Subjective/Objective Assessment:   PT ADMITTED WITH [facility], worsening of her cough in the past one week, along with subjective fevers     Action/Plan:   FROM HOME   Anticipated DC Date:  09/10/2013   Anticipated DC Plan:  HOME/SELF CARE  In-house referral  Clinical Social Worker      DC Planning Services  CM consult      Choice offered to / List presented to:             Status of service:  Completed, signed off Medicare Important Message given?  NA - LOS <3 / Initial given by admissions (If response is "NO", the following Medicare IM given date fields will be blank) Date Medicare IM given:   Date Additional Medicare IM given:    Discharge Disposition:  [place]  Per UR Regulation:  Reviewed for med. necessity/level of care/duration of stay  If discussed at [place] of Stay Meetings, dates discussed:   09/17/2013    Comments:  09/17/2013 Current plans are for patient to go to SNF after discharged from hospital. Current plans are for discharge to SNF when INRs are theurapeutic with adjustments of medication. Discussed in LOS meeting. Attending notified of SNF capabilities to manage blood draws and med managemnt.  09/10/13 MMcGibboney, RN, BSN Chart reviewed.

## 2013-09-17 NOTE — Progress Notes (Signed)
ANTICOAGULATION CONSULT NOTE - Follow Up  Pharmacy Consult for Lovenox and Coumadin Indication: atrial fibrillation  Allergies  Allergen Reactions  . Erythromycin Ethylsuccinate     Irregular pulse rate  . Prednisone     "makes me hyper"    Patient Measurements: Height: 5' (152.4 cm) Weight: 131 lb 8 oz (59.648 kg) IBW/kg (Calculated) : 45.5  Vital Signs: Temp: 98.3 F (36.8 C) (02/17 0530) Temp src: Oral (02/17 0530) BP: 133/86 mmHg (02/17 0530) Pulse Rate: 88 (02/17 1048)  Labs:  Recent Labs  09/15/13 0435 09/16/13 0530 09/17/13 0450  HGB 10.8* 10.6* 10.4*  HCT 34.6* 33.1* 32.4*  PLT 383 367 364  LABPROT 18.8* 17.5* 18.0*  INR 1.62* 1.48 1.53*  CREATININE 0.56 0.61 0.60    Estimated Creatinine Clearance: 43.7 ml/min (by C-G formula based on Cr of 0.6).   Medical History: Past Medical History  Diagnosis Date  . GERD (gastroesophageal reflux disease)   . Osteoporosis   . Postmenopausal HRT (hormone replacement therapy)   . Anemia   . Hypertension   . Allergy   . Asthma   . Shortness of breath   . PONV (postoperative nausea and vomiting)   . Pneumonia      Hx of pneumonia,69months ago  . Dysrhythmia     a fib  . Headache(784.0)   . Esophageal cancer     removed esophagus stomach pulled up    Medications:  Prescriptions prior to admission  Medication Sig Dispense Refill  . acetaminophen (TYLENOL) 500 MG tablet Take 500 mg by mouth daily as needed for headache.      . albuterol (PROVENTIL HFA;VENTOLIN HFA) 108 (90 BASE) MCG/ACT inhaler Inhale 2 puffs into the lungs every 6 (six) hours as needed. For wheezing  1 Inhaler  11  . arformoterol (BROVANA) 15 MCG/2ML NEBU Take 2 mLs (15 mcg total) by nebulization 2 (two) times daily. Dx: 493.00  120 mL  12  . atenolol-chlorthalidone (TENORETIC) 50-25 MG per tablet Take 1 tablet by mouth daily.      . budesonide (PULMICORT) 0.25 MG/2ML nebulizer solution Take 2 mLs (0.25 mg total) by nebulization 2 (two)  times daily. Dx: 493.00  120 mL  12  . Calcium-Vitamin D-Vitamin K (VIACTIV) 595-638-75 MG-UNT-MCG CHEW Chew by mouth 3 (three) times daily.        . chlorpheniramine-HYDROcodone (TUSSIONEX PENNKINETIC ER) 10-8 MG/5ML LQCR Take 5 mLs by mouth every 12 (twelve) hours as needed for cough (sedation caution).  120 mL  0  . digoxin (LANOXIN) 0.125 MG tablet Take 1 tablet (0.125 mg total) by mouth daily.  90 tablet  3  . Fe Fum-FA-B Cmp-C-Zn-Mg-Mn-Cu (HEMOCYTE PLUS) 106-1 MG CAPS Take 106 mg by mouth 2 (two) times daily.  180 each  3  . lansoprazole (PREVACID SOLUTAB) 30 MG disintegrating tablet Take 2 tablets (60 mg total) by mouth 2 (two) times daily.  360 tablet  3  . levocetirizine (XYZAL) 5 MG tablet Take 5 mg by mouth every evening. Patient takes only as needed      . metoCLOPramide (REGLAN) 5 MG tablet Take 5 mg by mouth 2 (two) times daily.      . mometasone-formoterol (DULERA) 100-5 MCG/ACT AERO Take 2 puffs first thing in am and then another 2 puffs about 12 hours later.  1 Inhaler  11  . Olopatadine HCl (PATADAY) 0.2 % SOLN Apply 1-2 drops to eye 2 (two) times daily as needed (dry, itchy eyes.).      Marland Kitchen  phenol (CHLORASEPTIC) 1.4 % LIQD Use as directed 1 spray in the mouth or throat as needed for throat irritation / pain.      . promethazine (PHENERGAN) 25 MG suppository Place 25 mg rectally every 6 (six) hours as needed for nausea or vomiting.      . sucralfate (CARAFATE) 1 GM/10ML suspension Take 10 mLs (1 g total) by mouth 2 (two) times daily.  1800 mL  3  . traMADol (ULTRAM) 50 MG tablet Take 50 mg by mouth every 6 (six) hours as needed for moderate pain.      . Vitamin D, Ergocalciferol, (DRISDOL) 50000 UNITS CAPS Take 1 capsule (50,000 Units total) by mouth every 7 (seven) days. On Friday  30 capsule  3  . warfarin (COUMADIN) 5 MG tablet Take 5 mg by mouth daily.       Marland Kitchen zolpidem (AMBIEN) 5 MG tablet Take 1 tablet (5 mg total) by mouth at bedtime as needed for sleep.  90 tablet  1    Scheduled:  . arformoterol  15 mcg Nebulization BID  . B-complex with vitamin C  1 tablet Oral BID  . budesonide  0.25 mg Nebulization BID  . calcium-vitamin D  1 tablet Oral TID  . cefTRIAXone (ROCEPHIN)  IV  1 g Intravenous Q24H  . dextromethorphan-guaiFENesin  1 tablet Oral q morning - 10a  . digoxin  0.125 mg Oral Daily  . enoxaparin (LOVENOX) injection  1 mg/kg Subcutaneous Q12H  . guaiFENesin-codeine  10 mL Oral QHS  . lansoprazole  60 mg Oral BID  . magnesium oxide  200 mg Oral BID  . metoCLOPramide  5 mg Oral BID  . sucralfate  1 g Oral BID  . Warfarin - Pharmacist Dosing Inpatient   Does not apply q1800    Assessment: 80yo F on chronic Coumadin for A.fib at dose of 5mg  daily was admitted 2/9 with fever, productive cough, and diarrhea. On 2/5 Coumadin was held d/t hematemesis. EGD on 2/11 showed no gross bleeding. Pharmacy is asked to resume anticoagulation with Lovenox bridging to Coumadin.   Home dose coumadin: 5mg  daily.    INR subtherapeutic today but now rising after coumadin doses: 6mg , 2.5mg , 1mg , 2.5mg , 5mg   CBC stable. No bleeding reported/documented.  SCr is wnl, CrCl ~98ml/min.  Goal of Therapy:  INR 2-3 Monitor platelets by anticoagulation protocol: Yes   Plan:  1) Continue Lovenox 55mg  SQ q12 until INR > 2   2) With INR responding, continue with 5mg  tonight  Adrian Saran, PharmD, BCPS Pager [phone] 09/17/2013 12:35 PM

## 2013-09-17 NOTE — Discharge Summary (Addendum)
Physician Discharge Summary  [name] EHO:[id number] DOB: 23-Jul-1929 DOA: 09/09/2013  PCP: Georgetta Haber, MD  Admit date: 09/09/2013 Discharge date: 09/17/2013  Time spent: 45 minutes  Recommendations for Outpatient Follow-up:  CAP (community acquired pneumonia)  -Leukocytosis resolved  -C. difficile negative and influenza PCR also negative. Follow up in culture  -She has had some aspiration in the past secondary to the GI problems listed below; speech therapy has evaluated patient and believes she has a good regimen to prevent her from aspirating no further eval required  -Continue diet regular  -Leukocytosis has resolved. DCed antibiotics on 2/15    Sepsis syndrome  -Patient drinking/eating DC IV and telemetry  -Improving clinically as above  - Patient ready for discharge to SNF   GERD  -Continue outpatient medications   Gastric outlet obstruction  -s/p stents in the past.  -Followed by GI/Dr. Deatra Ina   Asthma/COPD  -Stable, continue outpatient medications   History of hematemesis  -Followed by Dr. Deatra Ina  -Hemoglobin stable    History of atrial fibrillation  -with chronic anticoagulation/Coumadin held since Thursday 2/5  -She is in normal sinus rhythm at this time, when necessary IV metoprolol ordered>> follow.  -restarted Coumadin per pharmacy on 2/12, will cover with Lovenox until therapeutic  -2/16 INR= 1.53  - 2/17 NCM verified that [facility] facility could  adjust Coumadin medication and monitor Coumadin medication, in order to have patient reach   INR goal BB0-4    Diastolic CHF  -Patient currently not on a regimen for CHF; will need to monitor her weight daily and place her on a PRN schedule of Lasix to be monitored by her PCP  Sliding scale Lasix: Weigh yourself when you get home, then Daily in the Morning. Your dry weight will be what your scale says on the day you return home.(here is    lbs.).   If you gain more than 3 pounds from  dry weight: Start Lasix dosing 40 mg in the morning until weight returns to baseline dry weight.  If weight gain is greater than 5 pounds in 2 days: Increased to Lasix 40 mg twice a day and contact your PCP office for further assistance if weight does not go down the next day.  If the weight goes down more than 3 pounds from dry weight: Hold Lasix until it returns to baseline dry weight    -If patient's BP increase to the point you can add CHF medication would add Coreg, lisinopril, Imdur  by themselves or in combination.  - Patient to wear TED hose during waking hours   Pulmonary hypertension  -See diastolic CHF   Hypokalemia  -Initially SNF or PCP to monitor weekly   Red-faced  -DC doxycycline 2/15; Most likely secondary to doxycycline.  - Resolving   Upper extremity edema  -Secondary to fluid overload and CHFapply bilateral ACE wraps during waking hours PRN -SNF continue to gently diuresis       Discharge Diagnoses:  Active Problems:   ANEMIA-IRON DEFICIENCY   HYPERTENSION   GERD   Asthma with bronchitis   Gastric outlet obstruction   CAP (community acquired pneumonia)   Sepsis   PNA (pneumonia)   A-fib   Unspecified asthma(493.90)   Hematemesis   Acute blood loss anemia   Chronic diastolic CHF (congestive heart failure)   Pulmonary hypertension   Discharge Condition: Stable  Diet recommendation: Heart healthy Filed Weights   09/09/13 1529 09/10/13 1255 09/15/13 0453  Weight: 55.1 kg (121  lb 7.6 oz) 56.9 kg (125 lb 7.1 oz) 59.648 kg (131 lb 8 oz)    History of present illness:  [name] is a 80 y.o.WF PMHx HTN, asthma with bronchitis, iron deficiency anemia, gastric outlet obstruction status post stents in the past, atrial fibrillation on chronic Coumadin, hematemesis followed by Dr. Deatra Ina, history of aspiration who presents with above complaints. She states that she has had worsening of her cough in the past one week, along with subjective  fevers up. She states that the cough is productive of dark yellowish sputum. Also patient states that she she had diarrhea in the past week-nonbloody, but that she's not had any in the past 2 days. She states that 4-5 days ago she was having some hematemesis again and so went to Dr. Kelby Fam office and was asked to hold off her Coumadin and she's taken it since then. She denies chest pain and no leg swelling. She states she's not had any further hematemesis or diarrhea today. She was seen in the ED  EKG showed normal sinus rhythm at 93, chest x-ray showed new consolidation in the right mid lower lung zone.  She was febrile to 103.3 in ED with white cell count of 21.8, lactic acid normal at 0.93. She started on empiric antibiotics with Rocephin and doxycycline and admitted for further evaluation and management. 2/13 patient feeling significantly improved requests to walk around ward as much as possible. Encourage patient to do so but with supervision. 2/14 patient has been ambulating around work all day without adverse effect. Pedal edema resolving. Edematous arms still bothering patient. Patient also complains of red cheeks somewhat sensitive to palpation. 2/15 patient has been ambulating around the ward without adverse affects, does complain of left side pain which appears to be a muscle cramp. 2/16 patient doing well continues to ambulate around ward. Still dissatisfied with her current strength level, unable to place her on legs in the bed when she sits down. 2/17 she doing well has been up ambulating several times without DOE. Although patient's INR not therapeutic reassure patient that [facility] would be able to continue to adjust her Coumadin to therapeutic level   Consultants:     Procedures:  CT chest with contrast 09/12/2013  There are patchy airspace opacities involving the right middle lobe  correlating chest x-ray finding, consistent with pneumonia. No focal  discrete pulmonary mass is  noted. There patchy airspace  consolidation of bilateral lobes consistent with pneumonias. Small  bilateral pleural effusions    Echocardiogram 11/28/2011  - Left ventricle: The cavity size was normal. Mild concentric hypertrophy. Systolic function was normal.  -LVEF= 55%-60%.  -(grade 2 diastolic dysfunction). - Aortic valve: Moderate diffuse thickening and calcification. Moderate regurgitation. - Mitral valve: Mild regurgitation. - Pulmonary arteries: PA peak pressure: 27m Hg (S).    Antibiotics:  Rocephin 2/8>> 2/14  Doxycycline 2/8>> 2/15    Discharge Exam: Filed Vitals:   09/17/13 0530 09/17/13 0858 09/17/13 1048 09/17/13 1300  BP: 133/86   138/82  Pulse: 84  88 82  Temp: 98.3 F (36.8 C)   98.3 F (36.8 C)  TempSrc: Oral   Oral  Resp: 18   16  Height:      Weight:      SpO2: 93% 94%  95%   General: A./O. x4, NAD  Cardiovascular: Regular rate, regular rhythm negative murmurs rubs gallops,  Respiratory: Clear to auscultation bilaterally except for mild bibasilar crackles   Abdomen: Soft, nontender, nondistended,  plus bowel sound  Musculoskeletal: Mild pedal edema +1 bilateral, bilateral upper strandy edema improving  Skin; patient with red cheeks, which are resolving negative sensitivity to palpation; most likely secondary to doxycycline (resolving)  Discharge Instructions       Future Appointments Provider Department Dept Phone   09/27/2013 11:00 AM [facility] [place] [phone]   10/08/2013 1:45 PM Inda Castle, MD [facility] [phone]   11/11/2013 11:45 AM Ricard Dillon, MD [facility]       Medication List    STOP taking these medications       atenolol-chlorthalidone 50-25 MG per tablet  Commonly known as:  TENORETIC      TAKE these medications       acetaminophen 500 MG tablet  Commonly known as:  TYLENOL  Take 500 mg by mouth daily  as needed for headache.     albuterol 108 (90 BASE) MCG/ACT inhaler  Commonly known as:  PROVENTIL HFA;VENTOLIN HFA  Inhale 2 puffs into the lungs every 6 (six) hours as needed. For wheezing     arformoterol 15 MCG/2ML Nebu  Commonly known as:  BROVANA  Take 2 mLs (15 mcg total) by nebulization 2 (two) times daily. Dx: 493.00     budesonide 0.25 MG/2ML nebulizer solution  Commonly known as:  PULMICORT  Take 2 mLs (0.25 mg total) by nebulization 2 (two) times daily. Dx: 493.00     chlorpheniramine-HYDROcodone 10-8 MG/5ML Lqcr  Commonly known as:  TUSSIONEX PENNKINETIC ER  Take 5 mLs by mouth every 12 (twelve) hours as needed for cough (sedation caution).     dextromethorphan-guaiFENesin 30-600 MG per 12 hr tablet  Commonly known as:  MUCINEX DM  Take 1 tablet by mouth every morning.     digoxin 0.125 MG tablet  Commonly known as:  LANOXIN  Take 1 tablet (0.125 mg total) by mouth daily.     guaiFENesin-codeine 100-10 MG/5ML syrup  Take 10 mLs by mouth daily as needed for cough (At bedtime).     HEMOCYTE PLUS 106-1 MG Caps  Take 106 mg by mouth 2 (two) times daily.     lansoprazole 30 MG disintegrating tablet  Commonly known as:  PREVACID SOLUTAB  Take 2 tablets (60 mg total) by mouth 2 (two) times daily.     levocetirizine 5 MG tablet  Commonly known as:  XYZAL  Take 5 mg by mouth every evening. Patient takes only as needed     magnesium oxide 400 (241.3 MG) MG tablet  Commonly known as:  MAG-OX  Take 0.5 tablets (200 mg total) by mouth 2 (two) times daily.     metoCLOPramide 5 MG tablet  Commonly known as:  REGLAN  Take 5 mg by mouth 2 (two) times daily.     mometasone-formoterol 100-5 MCG/ACT Aero  Commonly known as:  DULERA  Take 2 puffs first thing in am and then another 2 puffs about 12 hours later.     PATADAY 0.2 % Soln  Generic drug:  Olopatadine HCl  Apply 1-2 drops to eye 2 (two) times daily as needed (dry, itchy eyes.).     phenol 1.4 % Liqd  Commonly  known as:  CHLORASEPTIC  Use as directed 1 spray in the mouth or throat as needed for throat irritation / pain.     promethazine 25 MG suppository  Commonly known as:  PHENERGAN  Place 25 mg rectally every 6 (six) hours as needed  for nausea or vomiting.     sucralfate 1 GM/10ML suspension  Commonly known as:  CARAFATE  Take 10 mLs (1 g total) by mouth 2 (two) times daily.     traMADol 50 MG tablet  Commonly known as:  ULTRAM  Take 50 mg by mouth every 6 (six) hours as needed for moderate pain.     VIACTIV [phone] MG-UNT-MCG Chew  Generic drug:  Calcium-Vitamin D-Vitamin K  Chew by mouth 3 (three) times daily.     Vitamin D (Ergocalciferol) 50000 UNITS Caps capsule  Commonly known as:  DRISDOL  Take 1 capsule (50,000 Units total) by mouth every 7 (seven) days. On Friday     warfarin 5 MG tablet  Commonly known as:  COUMADIN  Take 5 mg by mouth daily.     zolpidem 5 MG tablet  Commonly known as:  AMBIEN  Take 1 tablet (5 mg total) by mouth at bedtime as needed for sleep.       Allergies  Allergen Reactions  . Erythromycin Ethylsuccinate     Irregular pulse rate  . Prednisone     "makes me hyper"  . Doxycycline Rash   Follow-up Information   Follow up with Georgetta Haber, MD. Schedule an appointment as soon as possible for a visit in 1 week. [facility] followup)    Specialty:  Internal Medicine   Contact information:   [place] [address] [phone]       Schedule an appointment as soon as possible for a visit with Erskine Emery, MD. (As needed)    Specialty:  Gastroenterology   Contact information:   [address] [place] [address] [phone]       Follow up with [facility]). Schedule an appointment as soon as possible for a visit in 2 weeks. (Establish care, patient with atrial fibrillation and diastolic CHF will need to start CHF medication when BP tolerates)    Specialty:  Cardiology    Contact information:   [address], [place] [address] [phone]       The results of significant diagnostics from this hospitalization (including imaging, microbiology, ancillary and laboratory) are listed below for reference.    Significant Diagnostic Studies: Dg Chest 2 View  09/09/2013   CLINICAL DATA:  Chest discomfort. Weakness. Shortness breath. History of asthma and high blood pressure  EXAM: CHEST  2 VIEW  COMPARISON:  07/31/2013 chest x-ray.  01/15/2013 chest CT.  FINDINGS: Post gastric pull-up with stent in place.  Cardiomegaly.  Calcified tortuous aorta.  Consolidation peripheral aspect right mid-lower lung zone. This is new from the prior examination and may represent infiltrate or atelectasis. Underlying mass not excluded. Follow-up until clearance recommended.  Limited evaluation of the retrocardiac region secondary to the gastric pull up and adjacent atelectasis/scarring.  Degenerative changes mid thoracic spine.  IMPRESSION: Post gastric pull-up with stent in place.  Cardiomegaly.  Calcified tortuous aorta.  Consolidation peripheral aspect right mid-lower lung zone. This is new from the prior examination and may represent infiltrate or atelectasis. Underlying mass not excluded. Follow-up until clearance recommended.  Limited evaluation of the retrocardiac region secondary to the gastric pull up and adjacent atelectasis/scarring.   Electronically Signed   By: Chauncey Cruel M.D.   On: 09/09/2013 13:17   Ct Chest W Contrast  09/12/2013   CLINICAL DATA:  Shortness of breath with abnormal chest x-ray  EXAM: CT CHEST WITH CONTRAST  TECHNIQUE: Multidetector CT  imaging of the chest was performed during intravenous contrast administration.  CONTRAST:  47m OMNIPAQUE IOHEXOL 300 MG/ML  SOLN  COMPARISON:  Chest x-ray September 09, 2013  FINDINGS: There is no mediastinal or hilar lymphadenopathy. The heart size is enlarged. There is no pericardial effusion. There is a gastric  pull-through with a stent in place. There are small bilateral pleural effusions. There are patchy airspace opacity involving the right middle lobe correlating to the chest x-ray finding. There are patchy air space consolidation of bilateral lower lobes. The visualized upper abdominal structures unremarkable. There is question parapelvic cyst in the superior left kidney, incompletely included.  IMPRESSION: There are patchy airspace opacities involving the right middle lobe correlating chest x-ray finding, consistent with pneumonia. No focal discrete pulmonary mass is noted. There patchy airspace consolidation of bilateral lobes consistent with pneumonias. Small bilateral pleural effusions.   Electronically Signed   By: WAbelardo DieselM.D.   On: 09/12/2013 21:54    Microbiology: Recent Results (from the past 240 hour(s))  URINE CULTURE     Status: None   Collection Time    09/09/13 12:14 PM      Result Value Ref Range Status   Specimen Description URINE, RANDOM   Final   Special Requests NONE   Final   Culture  Setup Time     Final   Value: 09/09/2013 21:01     Performed at [facility]    Final   Value: >=100,000 COLONIES/ML     Performed at [facility]  Culture     Final   Value: ESCHERICHIA COLI     Performed at [facility]  Report Status 09/11/2013 FINAL   Final   Organism ID, Bacteria ESCHERICHIA COLI   Final  RESPIRATORY VIRUS PANEL     Status: None   Collection Time    09/09/13 12:23 PM      Result Value Ref Range Status   Source - [facility] NASAL SWAB   Corrected   Comment: CORRECTED ON 02/10 AT 1843: PREVIOUSLY REPORTED AS NASAL SWAB   Respiratory Syncytial Virus A NOT DETECTED   Final   Respiratory Syncytial Virus B NOT DETECTED   Final   Influenza A NOT DETECTED   Final   Influenza B NOT DETECTED   Final   Parainfluenza 1 NOT DETECTED   Final   Parainfluenza 2 NOT DETECTED   Final   Parainfluenza 3 NOT DETECTED   Final    Metapneumovirus NOT DETECTED   Final   Rhinovirus NOT DETECTED   Final   Adenovirus NOT DETECTED   Final   Influenza A H1 NOT DETECTED   Final   Influenza A H3 NOT DETECTED   Final   Comment: (NOTE)           Normal Reference Range for each Analyte: NOT DETECTED     Testing performed using the Luminex xTAG Respiratory Viral Panel test     kit.     This test was developed and its performance characteristics determined     by [facility] It has not been cleared or approved by the [place]    Food and Drug Administration. This test is used for clinical purposes.     It should not be regarded as investigational or for research. This     laboratory is certified under the [place](CL[place]) as qualified to  perform high complexity     clinical laboratory testing.     Performed at [facility] PCR SCREENING     Status: None   Collection Time    09/09/13  3:21 PM      Result Value Ref Range Status   MRSA by PCR NEGATIVE  NEGATIVE Final   Comment:            The GeneXpert MRSA Assay (FDA     approved for NASAL specimens     only), is one component of a     comprehensive MRSA colonization     surveillance program. It is not     intended to diagnose MRSA     infection nor to guide or     monitor treatment for     MRSA infections.  CULTURE, BLOOD (ROUTINE X 2)     Status: None   Collection Time    09/09/13  5:55 PM      Result Value Ref Range Status   Specimen Description BLOOD RIGHT ARM   Final   Special Requests BOTTLES DRAWN AEROBIC AND ANAEROBIC 10CC   Final   Culture  Setup Time     Final   Value: 09/10/2013 00:18     Performed at [facility]   Culture     Final   Value: NO GROWTH 5 DAYS     Performed at [facility]   Report Status 09/16/2013 FINAL   Final  CULTURE, BLOOD (ROUTINE X 2)     Status: None   Collection Time    09/09/13  6:05 PM      Result Value Ref Range Status   Specimen Description  BLOOD RIGHT ARM   Final   Special Requests BOTTLES DRAWN AEROBIC ONLY 1CC   Final   Culture  Setup Time     Final   Value: 09/10/2013 00:19     Performed at [facility]   Culture     Final   Value: NO GROWTH 5 DAYS     Performed at [facility]   Report Status 09/16/2013 FINAL   Final  CULTURE, EXPECTORATED SPUTUM-ASSESSMENT     Status: None   Collection Time    09/09/13  6:25 PM      Result Value Ref Range Status   Specimen Description SPUTUM   Final   Special Requests NONE   Final   Sputum evaluation     Final   Value: THIS SPECIMEN IS ACCEPTABLE. RESPIRATORY CULTURE REPORT TO FOLLOW.   Report Status 09/09/2013 FINAL   Final  CULTURE, RESPIRATORY (NON-EXPECTORATED)     Status: None   Collection Time    09/09/13  7:41 PM      Result Value Ref Range Status   Specimen Description SPUTUM   Final   Special Requests NONE   Final   Gram Stain     Final   Value: FEW WBC PRESENT, PREDOMINANTLY PMN     RARE SQUAMOUS EPITHELIAL CELLS PRESENT     FEW GRAM POSITIVE COCCI     IN PAIRS IN CHAINS RARE GRAM NEGATIVE RODS     Performed at [facility]   Culture     Final   Value: NORMAL OROPHARYNGEAL FLORA     Performed at [facility]   Report Status 09/12/2013 FINAL   Final  CLOSTRIDIUM DIFFICILE BY PCR     Status: None   Collection Time    09/09/13  10:00 PM      Result Value Ref Range Status   C difficile by pcr NEGATIVE  NEGATIVE Final   Comment: Performed at [facility]     Status: None   Collection Time    09/09/13 10:00 PM      Result Value Ref Range Status   Specimen Description STOOL   Final   Special Requests NONE   Final   Culture     Final   Value: NO SALMONELLA, SHIGELLA, CAMPYLOBACTER, YERSINIA, OR E.COLI 0157:H7 ISOLATED     Performed at [facility]   Report Status 09/13/2013 FINAL   Final     Labs: Basic Metabolic Panel:  Recent Labs Lab 09/13/13 0432 09/14/13 0450 09/15/13 0435 09/16/13 0530  09/17/13 0450  NA 137 142 140 138 138  K 3.9 3.4* 3.2* 3.9 3.9  CL 101 104 102 99 100  CO2 _0 GLUCOSE 183* 92 99 100* 97  BUN <3* 3* <3* <3* 5*  CREATININE 0.55 0.58 0.56 0.61 0.60  CALCIUM 8.5 8.7 8.4 7.9* 8.3*  MG 1.5 1.5 1.4* 1.4* 1.6   Liver Function Tests:  Recent Labs Lab 09/13/13 0432 09/14/13 0450 09/15/13 0435 09/16/13 0530 09/17/13 0450  AST _1 ALT _2 ALKPHOS 121* 98 94 89 85  BILITOT 0.2* <0.2* <0.2* 0.4 0.4  PROT 4.9* 4.6* 4.5* 4.4* 4.2*  ALBUMIN 2.0* 2.0* 2.1* 2.0* 1.9*   No results found for this basename: LIPASE, AMYLASE,  in the last 168 hours No results found for this basename: AMMONIA,  in the last 168 hours CBC:  Recent Labs Lab 09/13/13 0432 09/14/13 0450 09/15/13 0435 09/16/13 0530 09/17/13 0450  WBC 5.2 8.9 7.3 9.3 8.5  NEUTROABS 4.4 5.4 4.3 6.0 5.5  HGB 11.6* 10.8* 10.8* 10.6* 10.4*  HCT 36.6 34.1* 34.6* 33.1* 32.4*  MCV 80.8 80.4 80.7 79.8 79.4  PLT 333 433* 383 367 364   Cardiac Enzymes: No results found for this basename: CKTOTAL, CKMB, CKMBINDEX, TROPONINI,  in the last 168 hours BNP: BNP (last 3 results)  Recent Labs  02/25/13 1108  PROBNP 185.0*   CBG: No results found for this basename: GLUCAP,  in the last 168 hours     Signed:  Dia Crawford, MD [facility] [phone] pager

## 2013-09-18 NOTE — Progress Notes (Signed)
Clinical Social Work Department CLINICAL SOCIAL WORK PLACEMENT NOTE 09/18/2013  Patient:  [name], [name]  Account Number:  [id number] Admit date:  09/09/2013  Clinical Social Worker:  Renold Genta  Date/time:  09/12/2013 11:42 AM  Clinical Social Work is seeking post-discharge placement for this patient at the following level of care:   SKILLED NURSING   (*CSW will update this form in Epic as items are completed)   09/12/2013  Patient/family provided with [facility] Department of Clinical Social Work's list of facilities offering this level of care within the geographic area requested by the patient (or if unable, by the patient's family).  09/12/2013  Patient/family informed of their freedom to choose among providers that offer the needed level of care, that participate in Medicare, Medicaid or managed care program needed by the patient, have an available bed and are willing to accept the patient.  09/12/2013  Patient/family informed of [facility]' ownership interest in [facility], as well as of the fact that they are under no obligation to receive care at this facility.  PASARR submitted to EDS on 09/12/2013 PASARR number received from EDS on 09/12/2013  FL2 transmitted to all facilities in geographic area requested by pt/family on  09/12/2013 FL2 transmitted to all facilities within larger geographic area on   Patient informed that his/her managed care company has contracts with or will negotiate with  certain facilities, including the following:     Patient/family informed of bed offers received:  09/12/2013 Patient chooses bed at [facility] Physician recommends and patient chooses bed at    Patient to be transferred to [facility] on  09/18/2013 Patient to be transferred to facility by [facility]  The following physician request were entered in Epic:   Additional Comments:  Werner Lean LCSW [phone]

## 2013-10-08 ENCOUNTER — Ambulatory Visit (INDEPENDENT_AMBULATORY_CARE_PROVIDER_SITE_OTHER): Payer: Medicare Other | Admitting: Gastroenterology

## 2013-10-08 ENCOUNTER — Encounter: Payer: Self-pay | Admitting: Gastroenterology

## 2013-10-08 ENCOUNTER — Ambulatory Visit (INDEPENDENT_AMBULATORY_CARE_PROVIDER_SITE_OTHER): Payer: Medicare Other | Admitting: Family

## 2013-10-08 VITALS — BP 112/64 | HR 103 | Wt 125.0 lb

## 2013-10-08 VITALS — BP 116/70 | HR 116 | Temp 100.1°F | Ht 60.0 in | Wt 123.0 lb

## 2013-10-08 DIAGNOSIS — J189 Pneumonia, unspecified organism: Secondary | ICD-10-CM

## 2013-10-08 DIAGNOSIS — R609 Edema, unspecified: Secondary | ICD-10-CM

## 2013-10-08 DIAGNOSIS — Z9181 History of falling: Secondary | ICD-10-CM

## 2013-10-08 DIAGNOSIS — K311 Adult hypertrophic pyloric stenosis: Secondary | ICD-10-CM

## 2013-10-08 DIAGNOSIS — I4891 Unspecified atrial fibrillation: Secondary | ICD-10-CM

## 2013-10-08 DIAGNOSIS — Z5181 Encounter for therapeutic drug level monitoring: Secondary | ICD-10-CM

## 2013-10-08 DIAGNOSIS — K920 Hematemesis: Secondary | ICD-10-CM

## 2013-10-08 LAB — POCT INR: INR: 2

## 2013-10-08 LAB — CBC WITH DIFFERENTIAL/PLATELET
BASOS PCT: 0.1 % (ref 0.0–3.0)
Basophils Absolute: 0 10*3/uL (ref 0.0–0.1)
EOS PCT: 0.8 % (ref 0.0–5.0)
Eosinophils Absolute: 0.1 10*3/uL (ref 0.0–0.7)
HCT: 37.7 % (ref 36.0–46.0)
Hemoglobin: 12.1 g/dL (ref 12.0–15.0)
LYMPHS PCT: 8.3 % — AB (ref 12.0–46.0)
Lymphs Abs: 1.3 10*3/uL (ref 0.7–4.0)
MCHC: 32 g/dL (ref 30.0–36.0)
MCV: 79.9 fl (ref 78.0–100.0)
MONOS PCT: 6.6 % (ref 3.0–12.0)
Monocytes Absolute: 1 10*3/uL (ref 0.1–1.0)
Neutro Abs: 13.3 10*3/uL — ABNORMAL HIGH (ref 1.4–7.7)
Neutrophils Relative %: 84.2 % — ABNORMAL HIGH (ref 43.0–77.0)
Platelets: 341 10*3/uL (ref 150.0–400.0)
RBC: 4.72 Mil/uL (ref 3.87–5.11)
RDW: 17.5 % — ABNORMAL HIGH (ref 11.5–14.6)
WBC: 15.8 10*3/uL — AB (ref 4.5–10.5)

## 2013-10-08 LAB — HEPATIC FUNCTION PANEL
ALBUMIN: 2.9 g/dL — AB (ref 3.5–5.2)
ALK PHOS: 76 U/L (ref 39–117)
ALT: 10 U/L (ref 0–35)
AST: 17 U/L (ref 0–37)
Bilirubin, Direct: 0 mg/dL (ref 0.0–0.3)
Total Bilirubin: 0.3 mg/dL (ref 0.3–1.2)
Total Protein: 5.4 g/dL — ABNORMAL LOW (ref 6.0–8.3)

## 2013-10-08 LAB — BASIC METABOLIC PANEL
BUN: 8 mg/dL (ref 6–23)
CHLORIDE: 101 meq/L (ref 96–112)
CO2: 30 mEq/L (ref 19–32)
Calcium: 8.6 mg/dL (ref 8.4–10.5)
Creatinine, Ser: 0.7 mg/dL (ref 0.4–1.2)
GFR: 83.66 mL/min (ref >60.00)
GLUCOSE: 90 mg/dL (ref 70–99)
POTASSIUM: 3.7 meq/L (ref 3.5–5.1)
Sodium: 138 mEq/L (ref 135–145)

## 2013-10-08 MED ORDER — AMOXICILLIN 500 MG PO CAPS: 500.0000 mg | ORAL_CAPSULE | Freq: Two times a day (BID) | ORAL | Status: DC

## 2013-10-08 MED ORDER — LANSOPRAZOLE 30 MG PO TBDP: 60.0000 mg | ORAL_TABLET | Freq: Two times a day (BID) | ORAL | Status: DC

## 2013-10-08 MED ORDER — FUROSEMIDE 20 MG PO TABS: 20.0000 mg | ORAL_TABLET | Freq: Every day | ORAL | Status: DC

## 2013-10-08 NOTE — Progress Notes (Signed)
Pre visit review using our clinic review tool, if applicable. No additional management support is needed unless otherwise documented below in the visit note. 

## 2013-10-08 NOTE — Assessment & Plan Note (Signed)
Upper GI tract is patent.  Patient has no feeding issues.

## 2013-10-08 NOTE — Progress Notes (Signed)
          History of Present Illness:  [name] has returned following hospitalization last month for pneumonia.  Course was complicated by limited hematemesis while on Coumadin.  Endoscopy demonstrated some inflammatory changes at the distal end of the duodenal stent but no frank bleeding.  Since that time she's done well from a GI standpoint.  She continues to complain of weakness.  Today she had a low-grade temperature to over 100.  She denies nausea or vomiting.    Review of Systems: Pertinent positive and negative review of systems were noted in the above HPI section. All other review of systems were otherwise negative.    Current Medications, Allergies, Past Medical History, Past Surgical History, Family History and Social History were reviewed in [facility] record  Vital signs were reviewed in today's medical record. Physical Exam: General: Frail, elderly female in no acute distress Skin: anicteric Head: Normocephalic and atraumatic Eyes:  sclerae anicteric, EOMI Ears: Normal auditory acuity Mouth: No deformity or lesions Lungs: There are diffuse inspiratory and expiratory rhonchi, and expiratory wheezes Heart: Regular rate and rhythm; no murmurs, rubs or bruits Abdomen: Soft, non tender and non distended. No masses, hepatosplenomegaly or hernias noted. Normal Bowel sounds Rectal:deferred Musculoskeletal: Symmetrical with no gross deformities  Pulses:  Normal pulses noted Extremities: No clubbing, cyanosis, edema or deformities noted Neurological: Alert oriented x 4, grossly nonfocal Psychological:  Alert and cooperative. Normal mood and affect  See Assessment and Plan under Problem List

## 2013-10-08 NOTE — Assessment & Plan Note (Signed)
Patient had limited hematemesis in the setting of Coumadin introduced coagulopathy.  No acute inflammatory changes were seen.  There was some inflammation in the distal area where the stent meets the duodenal mucosa with expected mucosal hypertrophy.  There has been no overt bleeding since discharge.

## 2013-10-08 NOTE — Assessment & Plan Note (Signed)
Patient has a fever to 100 and  abnormal chest exam.  She was seen in her PCPs office today.  I will contact the extender to see whether they wish to place her on antibiotics in view of the fever.

## 2013-10-08 NOTE — Patient Instructions (Signed)
Follow up as needed

## 2013-10-08 NOTE — Patient Instructions (Addendum)
Anticoagulation Dose Instructions as of 10/08/2013     Dorene Grebe Tue Wed Thu Fri Sat   New Dose 5 mg 2.5 mg 5 mg 5 mg 5 mg 2.5 mg 5 mg    Description       5MG  DAILY EXCEPT 1/2 TAB (2.5MG ) MONDAY AND FRIDAY       Peripheral Edema You have swelling in your legs (peripheral edema). This swelling is due to excess accumulation of salt and water in your body. Edema may be a sign of heart, kidney or liver disease, or a side effect of a medication. It may also be due to problems in the leg veins. Elevating your legs and using special support stockings may be very helpful, if the cause of the swelling is due to poor venous circulation. Avoid long periods of standing, whatever the cause. Treatment of edema depends on identifying the cause. Chips, pretzels, pickles and other salty foods should be avoided. Restricting salt in your diet is almost always needed. Water pills (diuretics) are often used to remove the excess salt and water from your body via urine. These medicines prevent the kidney from reabsorbing sodium. This increases urine flow. Diuretic treatment may also result in lowering of potassium levels in your body. Potassium supplements may be needed if you have to use diuretics daily. Daily weights can help you keep track of your progress in clearing your edema. You should call your caregiver for follow up care as recommended. SEEK IMMEDIATE MEDICAL CARE IF:   You have increased swelling, pain, redness, or heat in your legs.  You develop shortness of breath, especially when lying down.  You develop chest or abdominal pain, weakness, or fainting.  You have a fever. Document Released: 08/25/2004 Document Revised: 10/10/2011 Document Reviewed: 08/05/2009 [facility] Patient Information 2014 [place].

## 2013-10-09 ENCOUNTER — Telehealth: Payer: Self-pay | Admitting: Internal Medicine

## 2013-10-09 ENCOUNTER — Encounter: Payer: Self-pay | Admitting: Family

## 2013-10-09 MED ORDER — METOCLOPRAMIDE HCL 5 MG PO TABS: 5.0000 mg | ORAL_TABLET | Freq: Two times a day (BID) | ORAL | Status: DC

## 2013-10-09 NOTE — Telephone Encounter (Signed)
rx sent in electronically 

## 2013-10-09 NOTE — Progress Notes (Signed)
Subjective:    Patient ID: [name], female    DOB: 12/14/1928, 80 y.o.   MRN: [id number]  HPI 80 year old white female, nonsmoker, with a history of congestive heart failure, atrial fibrillation, GERD is in today as a hospital followup. She presented to the emergency department with cough, fever and congestion and was diagnosed with pneumonia. She was hospitalized, given antibiotics and sent home on Levaquin. Family is concerned that she has swelling to her lower extremities. Family is requesting therapy on her medications. Also requesting a handicap placard. Continues to have fatigue and lightheadedness at times.   Review of Systems  Constitutional: Positive for fatigue. Negative for fever.  HENT: Positive for congestion. Negative for postnasal drip, rhinorrhea and sinus pressure.   Respiratory: Positive for cough.   Cardiovascular: Positive for leg swelling. Negative for chest pain and palpitations.  Gastrointestinal: Negative.   Endocrine: Negative.   Genitourinary: Negative.   Musculoskeletal: Negative.   Skin: Negative.   Allergic/Immunologic: Negative.   Neurological: Negative.   Psychiatric/Behavioral: Negative.    Past Medical History  Diagnosis Date  . GERD (gastroesophageal reflux disease)   . Osteoporosis   . Postmenopausal HRT (hormone replacement therapy)   . Anemia   . Hypertension   . Allergy   . Asthma   . Shortness of breath   . PONV (postoperative nausea and vomiting)   . Pneumonia      Hx of pneumonia,34months ago  . Dysrhythmia     a fib  . Headache(784.0)   . Esophageal cancer     removed esophagus stomach pulled up    History   Social History  . Marital Status: Single    Spouse Name: N/A    Number of Children: 1  . Years of Education: N/A   Occupational History  . Retired    Social History Main Topics  . Smoking status: Never Smoker   . Smokeless tobacco: Never Used  . Alcohol Use: No  . Drug Use: No  . Sexual Activity: Yes     Other Topics Concern  . Not on file   Social History Narrative   DAILY CAFFEINE    USE    Past Surgical History  Procedure Laterality Date  . Left oophorectomy    . Esophageal removal of cancer,gastric ppull-thru 1993    . Cholecystectomy    . Rt arm fx    . Esophagogastroduodenoscopy  10/19/2011    Procedure: ESOPHAGOGASTRODUODENOSCOPY (EGD);  Surgeon: Milus Banister, MD;  Location: [facility];  Service: Endoscopy;  Laterality: N/A;  . Balloon dilation  10/19/2011    Procedure: BALLOON DILATION;  Surgeon: Milus Banister, MD;  Location: [facility];  Service: Endoscopy;  Laterality: N/A;  . Esophagogastroduodenoscopy  11/07/2011    Procedure: ESOPHAGOGASTRODUODENOSCOPY (EGD);  Surgeon: Ladene Artist, MD,FACG;  Location: [facility] ENDOSCOPY;  Service: Endoscopy;  Laterality: N/A;  . Esophagogastroduodenoscopy  11/22/2011    Procedure: ESOPHAGOGASTRODUODENOSCOPY (EGD);  Surgeon: Inda Castle, MD;  Location: [facility];  Service: Endoscopy;  Laterality: N/A;  . Esophagogastroduodenoscopy  11/24/2011    Procedure: ESOPHAGOGASTRODUODENOSCOPY (EGD);  Surgeon: Inda Castle, MD;  Location: [facility];  Service: Endoscopy;  Laterality: N/A;  with removable duodenal stent (actually using esophageal partially covered 23X15 located in locked supply room  . Duodenal stent placement  11/24/2011    Procedure: DUODENAL STENT PLACEMENT;  Surgeon: Inda Castle, MD;  Location: [facility];  Service: Endoscopy;  Laterality: N/A;  . Video bronchoscopy Bilateral 01/18/2013  Procedure: VIDEO BRONCHOSCOPY WITHOUT FLUORO;  Surgeon: Tanda Rockers, MD;  Location: [facility];  Service: Cardiopulmonary;  Laterality: Bilateral;  . Esophagogastroduodenoscopy N/A 02/11/2013    Procedure: ESOPHAGOGASTRODUODENOSCOPY (EGD);  Surgeon: Inda Castle, MD;  Location: [facility];  Service: Endoscopy;  Laterality: N/A;  . Duodenal stent placement N/A 02/11/2013    Procedure: DUODENAL STENT PLACEMENT;   Surgeon: Inda Castle, MD;  Location: [facility];  Service: Endoscopy;  Laterality: N/A;  . Esophagogastroduodenoscopy N/A 09/11/2013    Procedure: ESOPHAGOGASTRODUODENOSCOPY (EGD);  Surgeon: Gatha Mayer, MD;  Location: [facility];  Service: Endoscopy;  Laterality: N/A;    Family History  Problem Relation Age of Onset  . Cervical cancer Mother   . Heart disease Father     MI  . Coronary artery disease Brother   . Hypotension Sister   . Colon cancer Neg Hx   . Colon polyps Sister   . Pancreatic cancer      1/2 sister  . Coronary artery disease Sister     pacemaker  . Asthma Brother   . Asthma Sister     Allergies  Allergen Reactions  . Erythromycin Ethylsuccinate     Irregular pulse rate  . Prednisone     "makes me hyper"  . Doxycycline Rash    Current Outpatient Prescriptions on File Prior to Visit  Medication Sig Dispense Refill  . acetaminophen (TYLENOL) 500 MG tablet Take 500 mg by mouth daily as needed for headache.      . albuterol (PROVENTIL HFA;VENTOLIN HFA) 108 (90 BASE) MCG/ACT inhaler Inhale 2 puffs into the lungs every 6 (six) hours as needed. For wheezing  1 Inhaler  11  . arformoterol (BROVANA) 15 MCG/2ML NEBU Take 2 mLs (15 mcg total) by nebulization 2 (two) times daily. Dx: 493.00  120 mL  12  . budesonide (PULMICORT) 0.25 MG/2ML nebulizer solution Take 2 mLs (0.25 mg total) by nebulization 2 (two) times daily. Dx: 493.00  120 mL  12  . Calcium-Vitamin D-Vitamin K (VIACTIV) [id number] MG-UNT-MCG CHEW Chew by mouth 3 (three) times daily.        Marland Kitchen dextromethorphan-guaiFENesin (MUCINEX DM) 30-600 MG per 12 hr tablet Take 1 tablet by mouth every morning.  30 tablet  0  . digoxin (LANOXIN) 0.125 MG tablet Take 1 tablet (0.125 mg total) by mouth daily.  90 tablet  3  . Fe Fum-FA-B Cmp-C-Zn-Mg-Mn-Cu (HEMOCYTE PLUS) 106-1 MG CAPS Take 106 mg by mouth 2 (two) times daily.  180 each  3  . lansoprazole (PREVACID SOLUTAB) 30 MG disintegrating tablet Take 2 tablets  (60 mg total) by mouth 2 (two) times daily.  360 tablet  3  . levocetirizine (XYZAL) 5 MG tablet Take 5 mg by mouth every evening. Patient takes only as needed      . magnesium oxide (MAG-OX) 400 (241.3 MG) MG tablet Take 0.5 tablets (200 mg total) by mouth 2 (two) times daily.  60 tablet  0  . metoCLOPramide (REGLAN) 5 MG tablet Take 5 mg by mouth 2 (two) times daily.      . mometasone-formoterol (DULERA) 100-5 MCG/ACT AERO Take 2 puffs first thing in am and then another 2 puffs about 12 hours later.  1 Inhaler  11  . Olopatadine HCl (PATADAY) 0.2 % SOLN Apply 1-2 drops to eye 2 (two) times daily as needed (dry, itchy eyes.).      Marland Kitchen phenol (CHLORASEPTIC) 1.4 % LIQD Use as directed 1 spray in the mouth or throat  as needed for throat irritation / pain.      . promethazine (PHENERGAN) 25 MG suppository Place 25 mg rectally every 6 (six) hours as needed for nausea or vomiting.      . sucralfate (CARAFATE) 1 GM/10ML suspension Take 10 mLs (1 g total) by mouth 2 (two) times daily.  1800 mL  3  . traMADol (ULTRAM) 50 MG tablet Take 50 mg by mouth every 6 (six) hours as needed for moderate pain.      . traMADol (ULTRAM) 50 MG tablet Take 1 tablet (50 mg total) by mouth every 8 (eight) hours as needed for moderate pain.  30 tablet  0  . Vitamin D, Ergocalciferol, (DRISDOL) 50000 UNITS CAPS Take 1 capsule (50,000 Units total) by mouth every 7 (seven) days. On Friday  30 capsule  3  . warfarin (COUMADIN) 5 MG tablet Take 5 mg by mouth daily.       Marland Kitchen zolpidem (AMBIEN) 5 MG tablet Take 1 tablet (5 mg total) by mouth at bedtime as needed for sleep.  90 tablet  1   No current facility-administered medications on file prior to visit.    BP 112/64  Pulse 103  Wt 125 lb (56.7 kg)chart    Objective:   Physical Exam  Constitutional: She is oriented to person, place, and time. She appears well-developed and well-nourished.  HENT:  Right Ear: External ear normal.  Left Ear: External ear normal.  Nose: Nose  normal.  Mouth/Throat: Oropharynx is clear and moist.  Eyes: Conjunctivae are normal. Pupils are equal, round, and reactive to light.  Neck: Normal range of motion. Neck supple.  Cardiovascular: Normal rate, regular rhythm and normal heart sounds.   Pulmonary/Chest: Effort normal. No respiratory distress. She has wheezes. She has rales.  Musculoskeletal: Normal range of motion.  Neurological: She is alert and oriented to person, place, and time.  Skin: Skin is warm and dry.  Psychiatric: She has a normal mood and affect.          Assessment & Plan:  Shaunte was seen today for no specified reason.  Diagnoses and associated orders for this visit:  CAP (community acquired pneumonia) - CBC with Differential - Basic Metabolic Panel - Hepatic Function Panel  Encounter for therapeutic drug monitoring - CBC with Differential - Basic Metabolic Panel - Hepatic Function Panel  Atrial fibrillation with rapid ventricular response - CBC with Differential - Basic Metabolic Panel - Hepatic Function Panel  Peripheral edema  Risk for falls  Other Orders - POCT INR - furosemide (LASIX) 20 MG tablet; Take 1 tablet (20 mg total) by mouth daily. - amoxicillin (AMOXIL) 500 MG capsule; Take 1 capsule (500 mg total) by mouth 2 (two) times daily.   Will cover patient with pneumonia with amoxicillin consider and she still had rales or rhonchi. Recheck on Monday to be sure she's progressing in the correct direction. Continue Coumadin as discussed. CBC and CMP sent.

## 2013-10-09 NOTE — Telephone Encounter (Signed)
Call [phone] metoCLOPramide (REGLAN) 5 MG tablet, re-fill needed. Please call pt to inform when ordered, per pt's request.  Pt would like for Dr. Adline Mango to call her.

## 2013-10-11 ENCOUNTER — Telehealth: Payer: Self-pay | Admitting: Internal Medicine

## 2013-10-11 ENCOUNTER — Other Ambulatory Visit: Payer: Self-pay | Admitting: Family

## 2013-10-11 MED ORDER — FUROSEMIDE 40 MG PO TABS: 40.0000 mg | ORAL_TABLET | Freq: Every day | ORAL | Status: DC

## 2013-10-11 NOTE — Telephone Encounter (Signed)
Pt would like Rx for lasix 40 mg. Advised her to take 2 of the 20mg  lasix in the meantime. Rx sent for lasix 40mg 

## 2013-10-11 NOTE — Telephone Encounter (Signed)
Pt was seen by padonda on 10/08/13, and was given rxfurosemide (LASIX) 20 MG tablet. Pt states she does not see a difference in going to the bathroom and her legs is still swollen with fluid, pt does not feel the medicine is working and needs to know what alternatives she has. States she does see padonda again on 10/14/13. Wants a call from the nurse if possible.

## 2013-10-11 NOTE — Telephone Encounter (Signed)
Increase lasix to 40mg  once daily. Support hose daily as discussed. Elevate legs.

## 2013-10-14 ENCOUNTER — Encounter: Payer: Self-pay | Admitting: Family

## 2013-10-14 ENCOUNTER — Ambulatory Visit: Payer: Medicare Other

## 2013-10-14 ENCOUNTER — Ambulatory Visit (INDEPENDENT_AMBULATORY_CARE_PROVIDER_SITE_OTHER)
Admission: RE | Admit: 2013-10-14 | Discharge: 2013-10-14 | Disposition: A | Discharge status: Home / Self Care | Payer: Medicare Other | Source: Ambulatory Visit | Attending: Family | Admitting: Family

## 2013-10-14 ENCOUNTER — Ambulatory Visit (INDEPENDENT_AMBULATORY_CARE_PROVIDER_SITE_OTHER): Payer: Medicare Other | Admitting: Family

## 2013-10-14 VITALS — BP 100/70 | HR 89 | Temp 98.6°F | Ht 60.0 in | Wt 120.0 lb

## 2013-10-14 DIAGNOSIS — J189 Pneumonia, unspecified organism: Secondary | ICD-10-CM

## 2013-10-14 DIAGNOSIS — I4891 Unspecified atrial fibrillation: Secondary | ICD-10-CM

## 2013-10-14 DIAGNOSIS — Z5181 Encounter for therapeutic drug level monitoring: Secondary | ICD-10-CM

## 2013-10-14 DIAGNOSIS — R609 Edema, unspecified: Secondary | ICD-10-CM

## 2013-10-14 LAB — BASIC METABOLIC PANEL
BUN: 8 mg/dL (ref 6–23)
CALCIUM: 8.7 mg/dL (ref 8.4–10.5)
CO2: 34 meq/L — AB (ref 19–32)
CREATININE: 0.7 mg/dL (ref 0.4–1.2)
Chloride: 100 mEq/L (ref 96–112)
GFR: 87.93 mL/min (ref >60.00)
Glucose, Bld: 85 mg/dL (ref 70–99)
Potassium: 4.3 mEq/L (ref 3.5–5.1)
Sodium: 139 mEq/L (ref 135–145)

## 2013-10-14 LAB — POCT INR: INR: 1.8

## 2013-10-14 NOTE — Progress Notes (Deleted)
   Subjective:    Patient ID: [name], female    DOB: 26-Nov-1928, 80 y.o.   MRN: [id number]  HPI    Review of Systems     Objective:   Physical Exam        Assessment & Plan:

## 2013-10-14 NOTE — Progress Notes (Signed)
Subjective:    Patient ID: [name], female    DOB: [birth year], 80 y.o.   MRN: [id number]  HPI 80 y.o. White female presents today for follow up after being seen for lower extremity edema and bilateral pneumonia. Pt was started on Amoxicillin and increased to 40mg  of lasix daily. Pt reports that her edema is much better, she is wearing her stockings daily and "peeing again". Pt states that she "feels much better overall, but still has junky lungs". Denies fever, SOB, malaise and change in appetite.     Review of Systems  Constitutional: Positive for activity change and fatigue.  HENT: Negative.   Eyes: Negative.   Respiratory: Positive for wheezing.   Cardiovascular: Negative.   Gastrointestinal: Negative.   Endocrine: Negative.   Musculoskeletal: Negative.   Skin: Negative.   Allergic/Immunologic: Negative.   Neurological: Negative.   Hematological: Negative.   Psychiatric/Behavioral: Negative.    Past Medical History  Diagnosis Date  . GERD (gastroesophageal reflux disease)   . Osteoporosis   . Postmenopausal HRT (hormone replacement therapy)   . Anemia   . Hypertension   . Allergy   . Asthma   . Shortness of breath   . PONV (postoperative nausea and vomiting)   . Pneumonia      Hx of pneumonia,73months ago  . Dysrhythmia     a fib  . Headache(784.0)   . Esophageal cancer     removed esophagus stomach pulled up    History   Social History  . Marital Status: Single    Spouse Name: N/A    Number of Children: 1  . Years of Education: N/A   Occupational History  . Retired    Social History Main Topics  . Smoking status: Never Smoker   . Smokeless tobacco: Never Used  . Alcohol Use: No  . Drug Use: No  . Sexual Activity: Yes   Other Topics Concern  . Not on file   Social History Narrative   DAILY CAFFEINE    USE    Past Surgical History  Procedure Laterality Date  . Left oophorectomy    . Esophageal removal of cancer,gastric ppull-thru  1993    . Cholecystectomy    . Rt arm fx    . Esophagogastroduodenoscopy  10/19/2011    Procedure: ESOPHAGOGASTRODUODENOSCOPY (EGD);  Surgeon: Milus Banister, MD;  Location: [facility];  Service: Endoscopy;  Laterality: N/A;  . Balloon dilation  10/19/2011    Procedure: BALLOON DILATION;  Surgeon: Milus Banister, MD;  Location: [facility];  Service: Endoscopy;  Laterality: N/A;  . Esophagogastroduodenoscopy  11/07/2011    Procedure: ESOPHAGOGASTRODUODENOSCOPY (EGD);  Surgeon: Ladene Artist, MD,FACG;  Location[facility];  Service: Endoscopy;  Laterality: N/A;  . Esophagogastroduodenoscopy  11/22/2011    Procedure: ESOPHAGOGASTRODUODENOSCOPY (EGD);  Surgeon: Inda Castle, MD;  Location: [facility];  Service: Endoscopy;  Laterality: N/A;  . Esophagogastroduodenoscopy  11/24/2011    Procedure: ESOPHAGOGASTRODUODENOSCOPY (EGD);  Surgeon: Inda Castle, MD;  Location: [facility];  Service: Endoscopy;  Laterality: N/A;  with removable duodenal stent (actually using esophageal partially covered 23X15 located in locked supply room  . Duodenal stent placement  11/24/2011    Procedure: DUODENAL STENT PLACEMENT;  Surgeon: Inda Castle, MD;  Location: [facility];  Service: Endoscopy;  Laterality: N/A;  . Video bronchoscopy Bilateral 01/18/2013    Procedure: VIDEO BRONCHOSCOPY WITHOUT FLUORO;  Surgeon: Tanda Rockers, MD;  Location: [facility];  Service: Cardiopulmonary;  Laterality:  Bilateral;  . Esophagogastroduodenoscopy N/A 02/11/2013    Procedure: ESOPHAGOGASTRODUODENOSCOPY (EGD);  Surgeon: Inda Castle, MD;  Location: [facility];  Service: Endoscopy;  Laterality: N/A;  . Duodenal stent placement N/A 02/11/2013    Procedure: DUODENAL STENT PLACEMENT;  Surgeon: Inda Castle, MD;  Location: [facility];  Service: Endoscopy;  Laterality: N/A;  . Esophagogastroduodenoscopy N/A 09/11/2013    Procedure: ESOPHAGOGASTRODUODENOSCOPY (EGD);  Surgeon: Gatha Mayer, MD;  Location: [facility];  Service: Endoscopy;  Laterality: N/A;    Family History  Problem Relation Age of Onset  . Cervical cancer Mother   . Heart disease Father     MI  . Coronary artery disease Brother   . Hypotension Sister   . Colon cancer Neg Hx   . Colon polyps Sister   . Pancreatic cancer      1/2 sister  . Coronary artery disease Sister     pacemaker  . Asthma Brother   . Asthma Sister     Allergies  Allergen Reactions  . Erythromycin Ethylsuccinate     Irregular pulse rate  . Prednisone     "makes me hyper"  . Doxycycline Rash    Current Outpatient Prescriptions on File Prior to Visit  Medication Sig Dispense Refill  . acetaminophen (TYLENOL) 500 MG tablet Take 500 mg by mouth daily as needed for headache.      . albuterol (PROVENTIL HFA;VENTOLIN HFA) 108 (90 BASE) MCG/ACT inhaler Inhale 2 puffs into the lungs every 6 (six) hours as needed. For wheezing  1 Inhaler  11  . amoxicillin (AMOXIL) 500 MG capsule Take 1 capsule (500 mg total) by mouth 2 (two) times daily.  20 capsule  0  . arformoterol (BROVANA) 15 MCG/2ML NEBU Take 2 mLs (15 mcg total) by nebulization 2 (two) times daily. Dx: 493.00  120 mL  12  . budesonide (PULMICORT) 0.25 MG/2ML nebulizer solution Take 2 mLs (0.25 mg total) by nebulization 2 (two) times daily. Dx: 493.00  120 mL  12  . Calcium-Vitamin D-Vitamin K (VIACTIV) 300-762-26 MG-UNT-MCG CHEW Chew by mouth 3 (three) times daily.        Marland Kitchen dextromethorphan-guaiFENesin (MUCINEX DM) 30-600 MG per 12 hr tablet Take 1 tablet by mouth every morning.  30 tablet  0  . digoxin (LANOXIN) 0.125 MG tablet Take 1 tablet (0.125 mg total) by mouth daily.  90 tablet  3  . Fe Fum-FA-B Cmp-C-Zn-Mg-Mn-Cu (HEMOCYTE PLUS) 106-1 MG CAPS Take 106 mg by mouth 2 (two) times daily.  180 each  3  . furosemide (LASIX) 20 MG tablet Take 1 tablet (20 mg total) by mouth daily.  30 tablet  0  . furosemide (LASIX) 40 MG tablet Take 1 tablet (40 mg total) by mouth daily.  30 tablet  0  .  lansoprazole (PREVACID SOLUTAB) 30 MG disintegrating tablet Take 2 tablets (60 mg total) by mouth 2 (two) times daily.  360 tablet  3  . levocetirizine (XYZAL) 5 MG tablet Take 5 mg by mouth every evening. Patient takes only as needed      . magnesium oxide (MAG-OX) 400 (241.3 MG) MG tablet Take 0.5 tablets (200 mg total) by mouth 2 (two) times daily.  60 tablet  0  . metoCLOPramide (REGLAN) 5 MG tablet Take 1 tablet (5 mg total) by mouth 2 (two) times daily.  180 tablet  3  . mometasone-formoterol (DULERA) 100-5 MCG/ACT AERO Take 2 puffs first thing in am and then another 2 puffs about 12  hours later.  1 Inhaler  11  . Olopatadine HCl (PATADAY) 0.2 % SOLN Apply 1-2 drops to eye 2 (two) times daily as needed (dry, itchy eyes.).      Marland Kitchen phenol (CHLORASEPTIC) 1.4 % LIQD Use as directed 1 spray in the mouth or throat as needed for throat irritation / pain.      . promethazine (PHENERGAN) 25 MG suppository Place 25 mg rectally every 6 (six) hours as needed for nausea or vomiting.      . sucralfate (CARAFATE) 1 GM/10ML suspension Take 10 mLs (1 g total) by mouth 2 (two) times daily.  1800 mL  3  . traMADol (ULTRAM) 50 MG tablet Take 50 mg by mouth every 6 (six) hours as needed for moderate pain.      . traMADol (ULTRAM) 50 MG tablet Take 1 tablet (50 mg total) by mouth every 8 (eight) hours as needed for moderate pain.  30 tablet  0  . Vitamin D, Ergocalciferol, (DRISDOL) 50000 UNITS CAPS Take 1 capsule (50,000 Units total) by mouth every 7 (seven) days. On Friday  30 capsule  3  . warfarin (COUMADIN) 5 MG tablet Take 5 mg by mouth daily.       Marland Kitchen zolpidem (AMBIEN) 5 MG tablet Take 1 tablet (5 mg total) by mouth at bedtime as needed for sleep.  90 tablet  1   No current facility-administered medications on file prior to visit.    BP 100/70  Pulse 89  Temp(Src) 98.6 F (37 C) (Oral)  Ht 5' (1.524 m)  Wt 120 lb (54.432 kg)  BMI 23.44 kg/m2chart    Objective:   Physical Exam  Constitutional: She is  oriented to person, place, and time. She appears well-developed and well-nourished. She is active.  Cardiovascular: Normal rate, regular rhythm, normal heart sounds and intact distal pulses.   Pulses:      Dorsalis pedis pulses are 1+ on the right side, and 1+ on the left side.       Posterior tibial pulses are 1+ on the right side, and 1+ on the left side.  +1 edema to bilateral legs.   Pulmonary/Chest: She has wheezes in the right middle field, the right lower field, the left middle field and the left lower field.  Neurological: She is alert and oriented to person, place, and time.  Skin: Skin is warm, dry and intact.  Psychiatric: She has a normal mood and affect. Her speech is normal and behavior is normal. Thought content normal.          Assessment & Plan:  Bridney was seen today for follow-up and coagulation disorder.  Diagnoses and associated orders for this visit:  Encounter for therapeutic drug monitoring  Atrial fibrillation with rapid ventricular response  CAP (community acquired pneumonia) - DG Chest 2 View; Future  Peripheral edema - Basic Metabolic Panel  Other Orders - POCT INR

## 2013-10-14 NOTE — Patient Instructions (Signed)

## 2013-10-15 ENCOUNTER — Telehealth: Payer: Self-pay | Admitting: Internal Medicine

## 2013-10-15 NOTE — Telephone Encounter (Signed)
Called patient to make appointment °No return call back °Sent letter  °

## 2013-10-18 ENCOUNTER — Telehealth: Payer: Self-pay | Admitting: Internal Medicine

## 2013-10-18 NOTE — Telephone Encounter (Signed)
Advised niece that unfortunately I cannot disclose pt information to her because DPR not on file for [place] office.   Spoke with pt and advise her of results.

## 2013-10-18 NOTE — Telephone Encounter (Signed)
Pt niece is requesting blood work and chest xray results

## 2013-10-21 ENCOUNTER — Other Ambulatory Visit: Payer: Self-pay | Admitting: Family

## 2013-10-21 ENCOUNTER — Telehealth: Payer: Self-pay | Admitting: Internal Medicine

## 2013-10-21 ENCOUNTER — Ambulatory Visit (INDEPENDENT_AMBULATORY_CARE_PROVIDER_SITE_OTHER): Payer: Medicare Other | Admitting: Family

## 2013-10-21 ENCOUNTER — Encounter: Payer: Self-pay | Admitting: Family

## 2013-10-21 VITALS — BP 108/62 | HR 114 | Temp 98.5°F | Wt 114.0 lb

## 2013-10-21 DIAGNOSIS — J189 Pneumonia, unspecified organism: Secondary | ICD-10-CM

## 2013-10-21 DIAGNOSIS — I4891 Unspecified atrial fibrillation: Secondary | ICD-10-CM

## 2013-10-21 DIAGNOSIS — Z5181 Encounter for therapeutic drug level monitoring: Secondary | ICD-10-CM

## 2013-10-21 DIAGNOSIS — J209 Acute bronchitis, unspecified: Secondary | ICD-10-CM

## 2013-10-21 LAB — CBC WITH DIFFERENTIAL/PLATELET
BASOS ABS: 0.1 10*3/uL (ref 0.0–0.1)
Basophils Relative: 0.4 % (ref 0.0–3.0)
EOS ABS: 0.5 10*3/uL (ref 0.0–0.7)
Eosinophils Relative: 3.2 % (ref 0.0–5.0)
HCT: 41.8 % (ref 36.0–46.0)
Hemoglobin: 13.2 g/dL (ref 12.0–15.0)
Lymphocytes Relative: 25.6 % (ref 12.0–46.0)
Lymphs Abs: 4 10*3/uL (ref 0.7–4.0)
MCHC: 31.6 g/dL (ref 30.0–36.0)
MCV: 80.3 fl (ref 78.0–100.0)
MONO ABS: 1.2 10*3/uL — AB (ref 0.1–1.0)
Monocytes Relative: 7.7 % (ref 3.0–12.0)
NEUTROS PCT: 63.1 % (ref 43.0–77.0)
Neutro Abs: 9.8 10*3/uL — ABNORMAL HIGH (ref 1.4–7.7)
PLATELETS: 339 10*3/uL (ref 150.0–400.0)
RBC: 5.21 Mil/uL — ABNORMAL HIGH (ref 3.87–5.11)
RDW: 19.2 % — ABNORMAL HIGH (ref 11.5–14.6)
WBC: 15.5 10*3/uL — ABNORMAL HIGH (ref 4.5–10.5)

## 2013-10-21 LAB — POCT INR: INR: 2

## 2013-10-21 MED ORDER — PREDNISONE 20 MG PO TABS: 20.0000 mg | ORAL_TABLET | Freq: Every day | ORAL | Status: DC

## 2013-10-21 MED ORDER — ALBUTEROL SULFATE (2.5 MG/3ML) 0.083% IN NEBU
2.5000 mg | INHALATION_SOLUTION | Freq: Once | RESPIRATORY_TRACT | Status: AC
  Administered 2013-10-21: 2.5 mg via RESPIRATORY_TRACT

## 2013-10-21 MED ORDER — AMOXICILLIN-POT CLAVULANATE 875-125 MG PO TABS: 1.0000 | ORAL_TABLET | Freq: Two times a day (BID) | ORAL | Status: DC

## 2013-10-21 NOTE — Patient Instructions (Addendum)
5MG  DAILY EXCEPT 1/2 TAB (2.5MG ) MONDAY AND FRIDAY   Anticoagulation Dose Instructions as of 10/21/2013     Dorene Grebe Tue Wed Thu Fri Sat   New Dose 5 mg 2.5 mg 5 mg 5 mg 5 mg 2.5 mg 5 mg    Description       5MG  DAILY EXCEPT 1/2 TAB (2.5MG ) MONDAY AND FRIDAY       Bronchitis Bronchitis is inflammation of the airways that extend from the windpipe into the lungs (bronchi). The inflammation often causes mucus to develop, which leads to a cough. If the inflammation becomes severe, it may cause shortness of breath. CAUSES  Bronchitis may be caused by:   Viral infections.   Bacteria.   Cigarette smoke.   Allergens, pollutants, and other irritants.  SIGNS AND SYMPTOMS  The most common symptom of bronchitis is a frequent cough that produces mucus. Other symptoms include:  Fever.   Body aches.   Chest congestion.   Chills.   Shortness of breath.   Sore throat.  DIAGNOSIS  Bronchitis is usually diagnosed through a medical history and physical exam. Tests, such as chest X-rays, are sometimes done to rule out other conditions.  TREATMENT  You may need to avoid contact with whatever caused the problem (smoking, for example). Medicines are sometimes needed. These may include:  Antibiotics. These may be prescribed if the condition is caused by bacteria.  Cough suppressants. These may be prescribed for relief of cough symptoms.   Inhaled medicines. These may be prescribed to help open your airways and make it easier for you to breathe.   Steroid medicines. These may be prescribed for those with recurrent (chronic) bronchitis. HOME CARE INSTRUCTIONS  Get plenty of rest.   Drink enough fluids to keep your urine clear or pale yellow (unless you have a medical condition that requires fluid restriction). Increasing fluids may help thin your secretions and will prevent dehydration.   Only take over-the-counter or prescription medicines as directed by your health care  provider.  Only take antibiotics as directed. Make sure you finish them even if you start to feel better.  Avoid secondhand smoke, irritating chemicals, and strong fumes. These will make bronchitis worse. If you are a smoker, quit smoking. Consider using nicotine gum or skin patches to help control withdrawal symptoms. Quitting smoking will help your lungs heal faster.   Put a cool-mist humidifier in your bedroom at night to moisten the air. This may help loosen mucus. Change the water in the humidifier daily. You can also run the hot water in your shower and sit in the bathroom with the door closed for 5 10 minutes.   Follow up with your health care provider as directed.   Wash your hands frequently to avoid catching bronchitis again or spreading an infection to others.  SEEK MEDICAL CARE IF: Your symptoms do not improve after 1 week of treatment.  SEEK IMMEDIATE MEDICAL CARE IF:  Your fever increases.  You have chills.   You have chest pain.   You have worsening shortness of breath.   You have bloody sputum.  You faint.  You have lightheadedness.  You have a severe headache.   You vomit repeatedly. MAKE SURE YOU:   Understand these instructions.  Will watch your condition.  Will get help right away if you are not doing well or get worse. Document Released: 07/18/2005 Document Revised: 05/08/2013 Document Reviewed: 03/12/2013 [facility] Patient Information 2014 [place].

## 2013-10-21 NOTE — Progress Notes (Signed)
Pre visit review using our clinic review tool, if applicable. No additional management support is needed unless otherwise documented below in the visit note. 

## 2013-10-21 NOTE — Progress Notes (Signed)
Subjective:    Patient ID: [name], female    DOB: 1928-10-24, 80 y.o.   MRN: [id number]  HPI 80 year old white female, nonsmoker, with a history of asthma, aspiration pneumonia, congestive heart failure, and atrial fibrillation on chronic Coumadin is in today with complaints of cough, congestion after aspirating yogurt 2 days ago. Since that time, the cough is worsened. She has a productive cough that is yellow and blood-tinged at times. Denies any fever, muscle aches or pain. Has not been taking any medication for relief. Over the last 3 weeks she has been on Levaquin and doxycycline for pneumonia. She is supposed to followup with pulmonology but has not at this point.   Review of Systems  Constitutional: Negative.   HENT: Negative.   Respiratory: Positive for cough, shortness of breath and wheezing.   Cardiovascular: Negative.   Gastrointestinal: Negative.   Endocrine: Negative.   Genitourinary: Negative.   Musculoskeletal: Negative.   Skin: Negative.   Neurological: Negative.   Hematological: Negative.   Psychiatric/Behavioral: Negative.    Past Medical History  Diagnosis Date  . GERD (gastroesophageal reflux disease)   . Osteoporosis   . Postmenopausal HRT (hormone replacement therapy)   . Anemia   . Hypertension   . Allergy   . Asthma   . Shortness of breath   . PONV (postoperative nausea and vomiting)   . Pneumonia      Hx of pneumonia,48months ago  . Dysrhythmia     a fib  . Headache(784.0)   . Esophageal cancer     removed esophagus stomach pulled up    History   Social History  . Marital Status: Single    Spouse Name: N/A    Number of Children: 1  . Years of Education: N/A   Occupational History  . Retired    Social History Main Topics  . Smoking status: Never Smoker   . Smokeless tobacco: Never Used  . Alcohol Use: No  . Drug Use: No  . Sexual Activity: Yes   Other Topics Concern  . Not on file   Social History Narrative   DAILY  CA[place]    USE    Past Surgical History  Procedure Laterality Date  . Left oophorectomy    . Esophageal removal of cancer,gastric ppull-thru 1993    . Cholecystectomy    . Rt arm fx    . Esophagogastroduodenoscopy  10/19/2011    Procedure: ESOPHAGOGASTRODUODENOSCOPY (EGD);  Surgeon: Milus Banister, MD;  Location: [facility];  Service: Endoscopy;  Laterality: N/A;  . Balloon dilation  10/19/2011    Procedure: BALLOON DILATION;  Surgeon: Milus Banister, MD;  Location: [facility] ENDOSCOPY;  Service: Endoscopy;  Laterality: N/A;  . Esophagogastroduodenoscopy  11/07/2011    Procedure: ESOPHAGOGASTRODUODENOSCOPY (EGD);  Surgeon: Ladene Artist, MD,FACG;  Location: [facility] ENDOSCOPY;  Service: Endoscopy;  Laterality: N/A;  . Esophagogastroduodenoscopy  11/22/2011    Procedure: ESOPHAGOGASTRODUODENOSCOPY (EGD);  Surgeon: Inda Castle, MD;  Location: [facility];  Service: Endoscopy;  Laterality: N/A;  . Esophagogastroduodenoscopy  11/24/2011    Procedure: ESOPHAGOGASTRODUODENOSCOPY (EGD);  Surgeon: Inda Castle, MD;  Location: [facility];  Service: Endoscopy;  Laterality: N/A;  with removable duodenal stent (actually using esophageal partially covered 23X15 located in locked supply room  . Duodenal stent placement  11/24/2011    Procedure: DUODENAL STENT PLACEMENT;  Surgeon: Inda Castle, MD;  Location: [facility] ENDOSCOPY;  Service: Endoscopy;  Laterality: N/A;  . Video bronchoscopy Bilateral 01/18/2013  Procedure: VIDEO BRONCHOSCOPY WITHOUT FLUORO;  Surgeon: Tanda Rockers, MD;  Location: [facility];  Service: Cardiopulmonary;  Laterality: Bilateral;  . Esophagogastroduodenoscopy N/A 02/11/2013    Procedure: ESOPHAGOGASTRODUODENOSCOPY (EGD);  Surgeon: Inda Castle, MD;  Location: [facility];  Service: Endoscopy;  Laterality: N/A;  . Duodenal stent placement N/A 02/11/2013    Procedure: DUODENAL STENT PLACEMENT;  Surgeon: Inda Castle, MD;  Location: [facility]OSCOPY;  Service: Endoscopy;   Laterality: N/A;  . Esophagogastroduodenoscopy N/A 09/11/2013    Procedure: ESOPHAGOGASTRODUODENOSCOPY (EGD);  Surgeon: Gatha Mayer, MD;  Location: [facility];  Service: Endoscopy;  Laterality: N/A;    Family History  Problem Relation Age of Onset  . Cervical cancer Mother   . Heart disease Father     MI  . Coronary artery disease Brother   . Hypotension Sister   . Colon cancer Neg Hx   . Colon polyps Sister   . Pancreatic cancer      1/2 sister  . Coronary artery disease Sister     pacemaker  . Asthma Brother   . Asthma Sister     Allergies  Allergen Reactions  . Erythromycin Ethylsuccinate     Irregular pulse rate  . Prednisone     "makes me hyper"  . Doxycycline Rash    Current Outpatient Prescriptions on File Prior to Visit  Medication Sig Dispense Refill  . acetaminophen (TYLENOL) 500 MG tablet Take 500 mg by mouth daily as needed for headache.      . albuterol (PROVENTIL HFA;VENTOLIN HFA) 108 (90 BASE) MCG/ACT inhaler Inhale 2 puffs into the lungs every 6 (six) hours as needed. For wheezing  1 Inhaler  11  . arformoterol (BROVANA) 15 MCG/2ML NEBU Take 2 mLs (15 mcg total) by nebulization 2 (two) times daily. Dx: 493.00  120 mL  12  . budesonide (PULMICORT) 0.25 MG/2ML nebulizer solution Take 2 mLs (0.25 mg total) by nebulization 2 (two) times daily. Dx: 493.00  120 mL  12  . Calcium-Vitamin D-Vitamin K (VIACTIV) 010-932-35 MG-UNT-MCG CHEW Chew by mouth 3 (three) times daily.        Marland Kitchen dextromethorphan-guaiFENesin (MUCINEX DM) 30-600 MG per 12 hr tablet Take 1 tablet by mouth every morning.  30 tablet  0  . digoxin (LANOXIN) 0.125 MG tablet Take 1 tablet (0.125 mg total) by mouth daily.  90 tablet  3  . Fe Fum-FA-B Cmp-C-Zn-Mg-Mn-Cu (HEMOCYTE PLUS) 106-1 MG CAPS Take 106 mg by mouth 2 (two) times daily.  180 each  3  . furosemide (LASIX) 20 MG tablet Take 1 tablet (20 mg total) by mouth daily.  30 tablet  0  . furosemide (LASIX) 40 MG tablet Take 1 tablet (40 mg  total) by mouth daily.  30 tablet  0  . lansoprazole (PREVACID SOLUTAB) 30 MG disintegrating tablet Take 2 tablets (60 mg total) by mouth 2 (two) times daily.  360 tablet  3  . levocetirizine (XYZAL) 5 MG tablet Take 5 mg by mouth every evening. Patient takes only as needed      . magnesium oxide (MAG-OX) 400 (241.3 MG) MG tablet Take 0.5 tablets (200 mg total) by mouth 2 (two) times daily.  60 tablet  0  . metoCLOPramide (REGLAN) 5 MG tablet Take 1 tablet (5 mg total) by mouth 2 (two) times daily.  180 tablet  3  . mometasone-formoterol (DULERA) 100-5 MCG/ACT AERO Take 2 puffs first thing in am and then another 2 puffs about 12 hours later.  1  Inhaler  11  . Olopatadine HCl (PATADAY) 0.2 % SOLN Apply 1-2 drops to eye 2 (two) times daily as needed (dry, itchy eyes.).      Marland Kitchen phenol (CHLORASEPTIC) 1.4 % LIQD Use as directed 1 spray in the mouth or throat as needed for throat irritation / pain.      . promethazine (PHENERGAN) 25 MG suppository Place 25 mg rectally every 6 (six) hours as needed for nausea or vomiting.      . sucralfate (CARAFATE) 1 GM/10ML suspension Take 10 mLs (1 g total) by mouth 2 (two) times daily.  1800 mL  3  . traMADol (ULTRAM) 50 MG tablet Take 50 mg by mouth every 6 (six) hours as needed for moderate pain.      . traMADol (ULTRAM) 50 MG tablet Take 1 tablet (50 mg total) by mouth every 8 (eight) hours as needed for moderate pain.  30 tablet  0  . Vitamin D, Ergocalciferol, (DRISDOL) 50000 UNITS CAPS Take 1 capsule (50,000 Units total) by mouth every 7 (seven) days. On Friday  30 capsule  3  . warfarin (COUMADIN) 5 MG tablet Take 5 mg by mouth daily.       Marland Kitchen zolpidem (AMBIEN) 5 MG tablet Take 1 tablet (5 mg total) by mouth at bedtime as needed for sleep.  90 tablet  1   No current facility-administered medications on file prior to visit.    BP 108/62  Pulse 114  Temp(Src) 98.5 F (36.9 C) (Oral)  Wt 114 lb (51.71 kg)  SpO2 94%chart    Objective:   Physical Exam    Constitutional: She is oriented to person, place, and time. She appears well-developed and well-nourished.  HENT:  Right Ear: External ear normal.  Left Ear: External ear normal.  Nose: Nose normal.  Mouth/Throat: Oropharynx is clear and moist.  Neck: Normal range of motion.  Cardiovascular: Normal rate, regular rhythm and normal heart sounds.   Pulmonary/Chest: Effort normal. She has wheezes.  Musculoskeletal: Normal range of motion.  Neurological: She is alert and oriented to person, place, and time.  Skin: Skin is warm and dry.  Psychiatric: She has a normal mood and affect.          Assessment & Plan:  [name] was seen today for no specified reason.  Diagnoses and associated orders for this visit:  Acute bronchitis - CBC with Differential - albuterol (PROVENTIL) (2.5 MG/3ML) 0.083% nebulizer solution 2.5 mg; Take 3 mLs (2.5 mg total) by nebulization once. - CBC with Differential  Encounter for therapeutic drug monitoring  Atrial fibrillation with rapid ventricular response  CAP (community acquired pneumonia) - CBC with Differential  Other Orders - POCT INR - predniSONE (DELTASONE) 20 MG tablet; Take 1 tablet (20 mg total) by mouth daily with breakfast.   Call the office with any questions or concerns. Recheck in 1 week and sooner as needed.

## 2013-10-21 NOTE — Telephone Encounter (Signed)
Rx sent to correct pharmacy.

## 2013-10-21 NOTE — Telephone Encounter (Signed)
Rx for predniSONE (DELTASONE) 20 MG tablet was sent to mail order pharmacy and not local pharmacy, which is [facility].

## 2013-10-21 NOTE — Telephone Encounter (Signed)
Patient Information:  Caller Name: [name]  Phone: [phone]  Patient: Erica, [name]  Gender: Female  DOB: 08-05-1928  Age: 80 Years  PCP: Benay Pillow (Adults only)  Office Follow Up:  Does the office need to follow up with this patient?: No  Instructions For The Office: N/A  RN Note:  Pt agrees to [place] and call back information  Symptoms  Reason For Call & Symptoms: Pt reports that she has chest congestion 3/21.  Pt states that she has aspirated the food that she was eating, she felt burning and gagging.  She reports that she has coughed up blood and has chest congestion since eating .  She reports that this has happened in the past. Fever 99.0.  She states that she feels really bad  Reviewed Health History In EMR: Yes  Reviewed Medications In EMR: Yes  Reviewed Allergies In EMR: Yes  Reviewed Surgeries / Procedures: Yes  Date of Onset of Symptoms: 10/19/2013  Guideline(s) Used:  Cough  Disposition Per Guideline:   Go to Office Now  Reason For Disposition Reached:   Coughed up > 1 tablespoon (15 ml) blood (Exception: blood-tinged sputum)  Advice Given:  Call Back If:  You become worse.  Patient Will Follow Care Advice:  YES  Appointment Scheduled:  10/21/2013 09:45:00 Appointment Scheduled Provider:  Roxy Cedar [facility])

## 2013-10-22 ENCOUNTER — Encounter: Payer: Self-pay | Admitting: Cardiovascular Disease

## 2013-10-22 ENCOUNTER — Ambulatory Visit (INDEPENDENT_AMBULATORY_CARE_PROVIDER_SITE_OTHER): Payer: Medicare Other | Admitting: Cardiovascular Disease

## 2013-10-22 VITALS — BP 90/66 | HR 66 | Ht 60.0 in | Wt 114.0 lb

## 2013-10-22 DIAGNOSIS — I4891 Unspecified atrial fibrillation: Secondary | ICD-10-CM

## 2013-10-22 DIAGNOSIS — I5032 Chronic diastolic (congestive) heart failure: Secondary | ICD-10-CM

## 2013-10-22 DIAGNOSIS — R0600 Dyspnea, unspecified: Secondary | ICD-10-CM

## 2013-10-22 DIAGNOSIS — J189 Pneumonia, unspecified organism: Secondary | ICD-10-CM

## 2013-10-22 DIAGNOSIS — R0989 Other specified symptoms and signs involving the circulatory and respiratory systems: Secondary | ICD-10-CM

## 2013-10-22 DIAGNOSIS — R0609 Other forms of dyspnea: Secondary | ICD-10-CM

## 2013-10-22 DIAGNOSIS — I509 Heart failure, unspecified: Secondary | ICD-10-CM

## 2013-10-22 DIAGNOSIS — I10 Essential (primary) hypertension: Secondary | ICD-10-CM

## 2013-10-22 NOTE — Assessment & Plan Note (Signed)
She is in NSR again today  Not clear to me that she should be on chronic coumadin given frailty Only afib was associated with hospitalization and acute stress.  Will forward to Dr Arnoldo Morale  She is elderly with low body weight which puts her at higher risk of bleeding

## 2013-10-22 NOTE — Assessment & Plan Note (Signed)
Well controlled.  Continue current medications and low sodium Dash type diet.    

## 2013-10-22 NOTE — Assessment & Plan Note (Signed)
She never has her pneumonia shot  She has chronic aspiration.  F/U pulmonary Lung exam still abnormal  Breathing improved. Cautioned her about drinking thin liquids

## 2013-10-22 NOTE — Assessment & Plan Note (Signed)
Contribution of this to her clinical syndrome is not clear to me Echo 2013 had grade two pseudonormal pattern suggesting some elevation in EDP  PA pressure estimate only 36 mmHg.  Reviewed CXR/CT from recent admission and no CHF LE edema is from chronic venous disease Continue current dose of lasix and f/u echo to see if EF still normal and what diastolic function looks like with stronger diuretic

## 2013-10-22 NOTE — Progress Notes (Signed)
Patient ID: [name], female   DOB: 10/10/1928, 80 y.o.   MRN: [id number] 80 yo referred post hospitalization for pneumonia ? CHF  Seen in 10/14  for LE edema and leg pain. She has known varicose veins. Sees someone at vein clinic. Has had stripping of right leg Has done poorly since placement of another duodenal stent 2013  Feels tingling in both LE;s Has had more LE edema Started on lasix with some improvement. Has RUE tremor from chronic reglan Protein caloric malnutrition. . No true claudication symptoms Some tingling in legs sounds more neuropathic.  Echo 4/13  Study Conclusions  - Left ventricle: The cavity size was normal. There was mild concentric hypertrophy. Systolic function was normal. The estimated ejection fraction was in the range of 55% to 60%. Wall motion was normal; there were no regional wall motion abnormalities. Features are consistent with a pseudonormal left ventricular filling pattern, with concomitant abnormal relaxation and increased filling pressure (grade 2 diastolic dysfunction). - Aortic valve: Moderate diffuse thickening and calcification. Moderate regurgitation. - Mitral valve: Mild regurgitation. - Pulmonary arteries: PA peak pressure: 65mm Hg (S).  Hospitalized for sepsis and pneumonia at beginning of February.  Was in NSR at that time  Only documented episode of afib was 4/13 when hospitalized for nausea vohmiting and duodenal stent.  Coumadin followed by Dr Nicole Cella issue seems to be chronic aspiration has f/u with Dr Joya Gaskins this month  Has failed multiple barrium swallows.  History of esophageal resection and CA with stricture      ROS: Denies fever, malais, weight loss, blurry vision, decreased visual acuity, cough, sputum, SOB, hemoptysis, pleuritic pain, palpitaitons, heartburn, abdominal pain, melena, lower extremity edema, claudication, or rash.  All other systems reviewed and negative  General: Affect appropriate Thin frail female   HEENT: normal Neck supple with no adenopathy JVP normal no bruits no thyromegaly Lungs rhonci and end expit  wheezing  RUL and good diaphragmatic motion Heart:  S1/S2 no murmur, no rub, gallop or click PMI normal Abdomen: benighn, BS positve, no tenderness, no AAA no bruit.  No HSM or HJR Distal pulses intact with no bruits Trace bilateral LE edema Neuro non-focal  Essential tremor RUE diminished with intentional motion  Skin warm and dry No muscular weakness   Current Outpatient Prescriptions  Medication Sig Dispense Refill  . acetaminophen (TYLENOL) 500 MG tablet Take 500 mg by mouth daily as needed for headache.      . albuterol (PROVENTIL HFA;VENTOLIN HFA) 108 (90 BASE) MCG/ACT inhaler Inhale 2 puffs into the lungs every 6 (six) hours as needed. For wheezing  1 Inhaler  11  . arformoterol (BROVANA) 15 MCG/2ML NEBU Take 2 mLs (15 mcg total) by nebulization 2 (two) times daily. Dx: 493.00  120 mL  12  . budesonide (PULMICORT) 0.25 MG/2ML nebulizer solution Take 2 mLs (0.25 mg total) by nebulization 2 (two) times daily. Dx: 493.00  120 mL  12  . dextromethorphan-guaiFENesin (MUCINEX DM) 30-600 MG per 12 hr tablet Take 1 tablet by mouth every morning.  30 tablet  0  . digoxin (LANOXIN) 0.125 MG tablet Take 1 tablet (0.125 mg total) by mouth daily.  90 tablet  3  . Fe Fum-FA-B Cmp-C-Zn-Mg-Mn-Cu (HEMOCYTE PLUS) 106-1 MG CAPS Take by mouth.      . furosemide (LASIX) 20 MG tablet Take 1 tablet (20 mg total) by mouth daily.  30 tablet  0  . furosemide (LASIX) 40 MG tablet Take 1 tablet (40 mg  total) by mouth daily.  30 tablet  0  . lansoprazole (PREVACID SOLUTAB) 30 MG disintegrating tablet Take 2 tablets (60 mg total) by mouth 2 (two) times daily.  360 tablet  3  . levocetirizine (XYZAL) 5 MG tablet Take 5 mg by mouth every evening. Patient takes only as needed      . magnesium oxide (MAG-OX) 400 (241.3 MG) MG tablet Take 0.5 tablets (200 mg total) by mouth 2 (two) times daily.  60 tablet   0  . metoCLOPramide (REGLAN) 5 MG tablet Take 1 tablet (5 mg total) by mouth 2 (two) times daily.  180 tablet  3  . mometasone-formoterol (DULERA) 100-5 MCG/ACT AERO Take 2 puffs first thing in am and then another 2 puffs about 12 hours later.  1 Inhaler  11  . Olopatadine HCl (PATADAY) 0.2 % SOLN Apply 1-2 drops to eye 2 (two) times daily as needed (dry, itchy eyes.).      Marland Kitchen phenol (CHLORASEPTIC) 1.4 % LIQD Use as directed 1 spray in the mouth or throat as needed for throat irritation / pain.      . predniSONE (DELTASONE) 20 MG tablet Take 1 tablet (20 mg total) by mouth daily with breakfast.  7 tablet  0  . promethazine (PHENERGAN) 25 MG suppository Place 25 mg rectally every 6 (six) hours as needed for nausea or vomiting.      . sucralfate (CARAFATE) 1 GM/10ML suspension Take 10 mLs (1 g total) by mouth 2 (two) times daily.  1800 mL  3  . traMADol (ULTRAM) 50 MG tablet Take 1 tablet (50 mg total) by mouth every 8 (eight) hours as needed for moderate pain.  30 tablet  0  . Vitamin D, Ergocalciferol, (DRISDOL) 50000 UNITS CAPS Take 1 capsule (50,000 Units total) by mouth every 7 (seven) days. On Friday  30 capsule  3  . warfarin (COUMADIN) 5 MG tablet Take 5 mg by mouth daily.       Marland Kitchen zolpidem (AMBIEN) 5 MG tablet Take 1 tablet (5 mg total) by mouth at bedtime as needed for sleep.  90 tablet  1   No current facility-administered medications for this visit.    Allergies  Erythromycin ethylsuccinate; Prednisone; and Doxycycline  Electrocardiogram:  SR PAC nonspecific ST T wave changes   Assessment and Plan

## 2013-10-22 NOTE — Patient Instructions (Signed)
Your physician recommends that you schedule a follow-up appointment as needed with Dr. Johnsie Cancel  Your physician has requested that you have an echocardiogram. Echocardiography is a painless test that uses sound waves to create images of your heart. It provides your doctor with information about the size and shape of your heart and how well your heart's chambers and valves are working. This procedure takes approximately one hour. There are no restrictions for this procedure.

## 2013-10-28 ENCOUNTER — Encounter: Payer: Self-pay | Admitting: Family

## 2013-10-28 ENCOUNTER — Ambulatory Visit (INDEPENDENT_AMBULATORY_CARE_PROVIDER_SITE_OTHER): Payer: Medicare Other | Admitting: Family

## 2013-10-28 VITALS — BP 104/70 | HR 84 | Ht 60.0 in | Wt 115.0 lb

## 2013-10-28 DIAGNOSIS — Z5181 Encounter for therapeutic drug level monitoring: Secondary | ICD-10-CM

## 2013-10-28 DIAGNOSIS — R609 Edema, unspecified: Secondary | ICD-10-CM

## 2013-10-28 DIAGNOSIS — J189 Pneumonia, unspecified organism: Secondary | ICD-10-CM

## 2013-10-28 DIAGNOSIS — I4891 Unspecified atrial fibrillation: Secondary | ICD-10-CM

## 2013-10-28 DIAGNOSIS — J45909 Unspecified asthma, uncomplicated: Secondary | ICD-10-CM

## 2013-10-28 LAB — POCT INR: INR: 2.2

## 2013-10-28 NOTE — Progress Notes (Signed)
Pre visit review using our clinic review tool, if applicable. No additional management support is needed unless otherwise documented below in the visit note. 

## 2013-10-28 NOTE — Patient Instructions (Addendum)
Anticoagulation Dose Instructions as of 10/28/2013     [name] Tue Wed Thu Fri Sat   New Dose 5 mg 2.5 mg 5 mg 5 mg 5 mg 2.5 mg 5 mg    Description       5MG  DAILY EXCEPT 1/2 TAB (2.5MG ) MONDAY AND FRIDAY         Atrial Fibrillation Atrial fibrillation is a type of irregular heart rhythm (arrhythmia). During atrial fibrillation, the upper chambers of the heart (atria) quiver continuously in a chaotic pattern. This causes an irregular and often rapid heart rate.  Atrial fibrillation is the result of the heart becoming overloaded with disorganized signals that tell it to beat. These signals are normally released one at a time by a part of the right atrium called the sinoatrial node. They then travel from the atria to the lower chambers of the heart (ventricles), causing the atria and ventricles to contract and pump blood as they pass. In atrial fibrillation, parts of the atria outside of the sinoatrial node also release these signals. This results in two problems. First, the atria receive so many signals that they do not have time to fully contract. Second, the ventricles, which can only receive one signal at a time, beat irregularly and out of rhythm with the atria.  There are three types of atrial fibrillation:   Paroxysmal Paroxysmal atrial fibrillation starts suddenly and stops on its own within a week.   Persistent Persistent atrial fibrillation lasts for more than a week. It may stop on its own or with treatment.   Permanent Permanent atrial fibrillation does not go away. Episodes of atrial fibrillation may lead to permanent atrial fibrillation.  Atrial fibrillation can prevent your heart from pumping blood normally. It increases your risk of stroke and can lead to heart failure.  CAUSES   Heart conditions, including a heart attack, heart failure, coronary artery disease, and heart valve conditions.   Inflammation of the sac that surrounds the heart (pericarditis).   Blockage of  an artery in the lungs (pulmonary embolism).   Pneumonia or other infections.   Chronic lung disease.   Thyroid problems, especially if the thyroid is overactive (hyperthyroidism).   Caffeine, excessive alcohol use, and use of some illegal drugs.   Use of some medications, including certain decongestants and diet pills.   Heart surgery.   Birth defects.  Sometimes, no cause can be found. When this happens, the atrial fibrillation is called lone atrial fibrillation. The risk of complications from atrial fibrillation increases if you have lone atrial fibrillation and you are age 78 years or older. RISK FACTORS  Heart failure.  Coronary artery disease  Diabetes mellitus.   High blood pressure (hypertension).   Obesity.   Other arrhythmias.   Increased age. SYMPTOMS   A feeling that your heart is beating rapidly or irregularly.   A feeling of discomfort or pain in your chest.   Shortness of breath.   Sudden lightheadedness or weakness.   Getting tired easily when exercising.   Urinating more often than normal (mainly when atrial fibrillation first begins).  In paroxysmal atrial fibrillation, symptoms may start and suddenly stop. DIAGNOSIS  Your caregiver may be able to detect atrial fibrillation when taking your pulse. Usually, testing is needed to diagnosis atrial fibrillation. Tests may include:   Electrocardiography. During this test, the electrical impulses of your heart are recorded while you are lying down.   Echocardiography. During echocardiography, sound waves are used to evaluate how blood  flows through your heart.   Stress test. There is more than one type of stress test. If a stress test is needed, ask your caregiver about which type is best for you.   Chest X-ray exam.   Blood tests.   Computed tomography (CT).  TREATMENT   Treating any underlying conditions. For example, if you have an overactive thyroid, treating the  condition may correct atrial fibrillation.   Medication. Medications may be given to control a rapid heart rate or to prevent blood clots, heart failure, or a stroke.   Procedure to correct the rhythm of the heart:  Electrical cardioversion. During electrical cardioversion, a controlled, low-energy shock is delivered to the heart through your skin. If you have chest pain, very low pressure blood pressure, or sudden heart failure, this procedure may need to be done as an emergency.  Catheter ablation. During this procedure, heart tissues that send the signals that cause atrial fibrillation are destroyed.  Maze or minimaze procedure. During this surgery, thin lines of heart tissue that carry the abnormal signals are destroyed. The maze procedure is an open-heart surgery. The minimaze procedure is a minimally invasive surgery. This means that small cuts are made to access the heart instead of a large opening.  Pulmonary venous isolation. During this surgery, tissue around the veins that carry blood from the lungs (pulmonary veins) is destroyed. This tissue is thought to carry the abnormal signals. HOME CARE INSTRUCTIONS   Take medications as directed by your caregiver.  Only take medications that your caregiver approves. Some medications can make atrial fibrillation worse or recur.  If blood thinners were prescribed by your caregiver, take them exactly as directed. Too much can cause bleeding. Too little and you will not have the needed protection against stroke and other problems.  Perform blood tests at home if directed by your caregiver.  Perform blood tests exactly as directed.   Quit smoking if you smoke.   Do not drink alcohol.   Do not drink caffeinated beverages such as coffee, soda, and some teas. You may drink decaffeinated coffee, soda, or tea.   Maintain a healthy weight. Do not use diet pills unless your caregiver approves. They may make heart problems worse.    Follow diet instructions as directed by your caregiver.   Exercise regularly as directed by your caregiver.   Keep all follow-up appointments. PREVENTION  The following substances can cause atrial fibrillation to recur:   Caffeinated beverages.   Alcohol.   Certain medications, especially those used for breathing problems.   Certain herbs and herbal medications, such as those containing ephedra or ginseng.  Illegal drugs such as cocaine and amphetamines. Sometimes medications are given to prevent atrial fibrillation from recurring. Proper treatment of any underlying condition is also important in helping prevent recurrence.  SEEK MEDICAL CARE IF:  You notice a change in the rate, rhythm, or strength of your heartbeat.   You suddenly begin urinating more frequently.   You tire more easily when exerting yourself or exercising.  SEEK IMMEDIATE MEDICAL CARE IF:   You develop chest pain, abdominal pain, sweating, or weakness.  You feel sick to your stomach (nauseous).  You develop shortness of breath.  You suddenly develop swollen feet and ankles.  You feel dizzy.  You face or limbs feel numb or weak.  There is a change in your vision or speech. MAKE SURE YOU:   Understand these instructions.  Will watch your condition.  Will get help right away  if you are not doing well or get worse. Document Released: 07/18/2005 Document Revised: 11/12/2012 Document Reviewed: 08/28/2012 [facility] Patient Information 2014 [place].

## 2013-10-28 NOTE — Progress Notes (Signed)
Subjective:    Patient ID: [name], female    DOB: January 04, 1929, 80 y.o.   MRN: [id number]  HPI 80 y.o. White female presents today for a one week follow up. Pt has had a complicated recent history including bilateral pneumonia, sepsis, asthma, edema and hematemesis. Pt has been closely followed to monitor respiratory function, weakness and atrial fibrillation. Today pt presents and states "i finally feel great". Today is pt last day of prednisone which has helped with her breathing and increased her appetite per pt. She is using her inhaler once per day and denies SOB, increased WOB. Pt denies fever, fatigue, chills, malaise. She request to be able to drive again. Pt also had INR done today for a 2 week follow up.     Review of Systems  Constitutional: Positive for appetite change.       Increased appetite.   HENT: Negative.   Eyes: Negative.   Respiratory: Negative.   Cardiovascular: Negative.   Gastrointestinal: Negative.   Endocrine: Negative.   Musculoskeletal: Negative.   Skin: Negative.   Allergic/Immunologic: Negative.   Neurological: Positive for weakness.       Acknowledges occasional weakness, but overall feels she is much improved.   Hematological: Negative.   Psychiatric/Behavioral: Negative.    Past Medical History  Diagnosis Date  . GERD (gastroesophageal reflux disease)   . Osteoporosis   . Postmenopausal HRT (hormone replacement therapy)   . Anemia   . Hypertension   . Allergy   . Asthma   . Shortness of breath   . PONV (postoperative nausea and vomiting)   . Pneumonia      Hx of pneumonia,33months ago  . Dysrhythmia     a fib  . Headache(784.0)   . Esophageal cancer     removed esophagus stomach pulled up    History   Social History  . Marital Status: Single    Spouse Name: N/A    Number of Children: 1  . Years of Education: N/A   Occupational History  . Retired    Social History Main Topics  . Smoking status: Never Smoker   .  Smokeless tobacco: Never Used  . Alcohol Use: No  . Drug Use: No  . Sexual Activity: Yes   Other Topics Concern  . Not on file   Social History Narrative   DAILY CAFFEINE    USE    Past Surgical History  Procedure Laterality Date  . Left oophorectomy    . Esophageal removal of cancer,gastric ppull-thru 1993    . Cholecystectomy    . Rt arm fx    . Esophagogastroduodenoscopy  10/19/2011    Procedure: ESOPHAGOGASTRODUODENOSCOPY (EGD);  Surgeon: Milus Banister, MD;  Location: [facility];  Service: Endoscopy;  Laterality: N/A;  . Balloon dilation  10/19/2011    Procedure: BALLOON DILATION;  Surgeon: Milus Banister, MD;  Location: [facility] ENDOSCOPY;  Service: Endoscopy;  Laterality: N/A;  . Esophagogastroduodenoscopy  11/07/2011    Procedure: ESOPHAGOGASTRODUODENOSCOPY (EGD);  Surgeon: Ladene Artist, MD,FACG;  Location: [facility] END[facility]COPY;  Service: Endoscopy;  Laterality: N/A;  . Esophagogastroduodenoscopy  11/22/2011    Procedure: ESOPHAGOGASTRODUODENOSCOPY (EGD);  Surgeon: Inda Castle, MD;  Location: [facility];  Service: Endoscopy;  Laterality: N/A;  . Esophagogastroduodenoscopy  11/24/2011    Procedure: ESOPHAGOGASTRODUODENOSCOPY (EGD);  Surgeon: Inda Castle, MD;  Location: [facility];  Service: Endoscopy;  Laterality: N/A;  with removable duodenal stent (actually using esophageal partially covered 23X15 located in locked  supply room  . Duodenal stent placement  11/24/2011    Procedure: DUODENAL STENT PLACEMENT;  Surgeon: Inda Castle, MD;  Location: [facility] ENDOSCOPY;  Service: Endoscopy;  Laterality: N/A;  . Video bronchoscopy Bilateral 01/18/2013    Procedure: VIDEO BRONCHOSCOPY WITHOUT FLUORO;  Surgeon: Tanda Rockers, MD;  Location: [facility] ENDOSCOPY;  Service: Cardiopulmonary;  Laterality: Bilateral;  . Esophagogastroduodenoscopy N/A 02/11/2013    Procedure: ESOPHAGOGASTRODUODENOSCOPY (EGD);  Surgeon: Inda Castle, MD;  Location: [facility];  Service: Endoscopy;  Laterality: N/A;    . Duodenal stent placement N/A 02/11/2013    Procedure: DUODENAL STENT PLACEMENT;  Surgeon: Inda Castle, MD;  Location: [facility];  Service: Endoscopy;  Laterality: N/A;  . Esophagogastroduodenoscopy N/A 09/11/2013    Procedure: ESOPHAGOGASTRODUODENOSCOPY (EGD);  Surgeon: Gatha Mayer, MD;  Location: [facility];  Service: Endoscopy;  Laterality: N/A;    Family History  Problem Relation Age of Onset  . Cervical cancer Mother   . Heart disease Father     MI  . Coronary artery disease Brother   . Hypotension Sister   . Colon cancer Neg Hx   . Colon polyps Sister   . Pancreatic cancer      1/2 sister  . Coronary artery disease Sister     pacemaker  . Asthma Brother   . Asthma Sister     Allergies  Allergen Reactions  . Erythromycin Ethylsuccinate     Irregular pulse rate  . Prednisone     "makes me hyper"  . Doxycycline Rash    Current Outpatient Prescriptions on File Prior to Visit  Medication Sig Dispense Refill  . acetaminophen (TYLENOL) 500 MG tablet Take 500 mg by mouth daily as needed for headache.      . albuterol (PROVENTIL HFA;VENTOLIN HFA) 108 (90 BASE) MCG/ACT inhaler Inhale 2 puffs into the lungs every 6 (six) hours as needed. For wheezing  1 Inhaler  11  . arformoterol (BROVANA) 15 MCG/2ML NEBU Take 2 mLs (15 mcg total) by nebulization 2 (two) times daily. Dx: 493.00  120 mL  12  . budesonide (PULMICORT) 0.25 MG/2ML nebulizer solution Take 2 mLs (0.25 mg total) by nebulization 2 (two) times daily. Dx: 493.00  120 mL  12  . dextromethorphan-guaiFENesin (MUCINEX DM) 30-600 MG per 12 hr tablet Take 1 tablet by mouth every morning.  30 tablet  0  . digoxin (LANOXIN) 0.125 MG tablet Take 1 tablet (0.125 mg total) by mouth daily.  90 tablet  3  . Fe Fum-FA-B Cmp-C-Zn-Mg-Mn-Cu (HEMOCYTE PLUS) 106-1 MG CAPS Take by mouth.      . furosemide (LASIX) 20 MG tablet Take 1 tablet (20 mg total) by mouth daily.  30 tablet  0  . furosemide (LASIX) 40 MG tablet Take 1  tablet (40 mg total) by mouth daily.  30 tablet  0  . lansoprazole (PREVACID SOLUTAB) 30 MG disintegrating tablet Take 2 tablets (60 mg total) by mouth 2 (two) times daily.  360 tablet  3  . levocetirizine (XYZAL) 5 MG tablet Take 5 mg by mouth every evening. Patient takes only as needed      . magnesium oxide (MAG-OX) 400 (241.3 MG) MG tablet Take 0.5 tablets (200 mg total) by mouth 2 (two) times daily.  60 tablet  0  . metoCLOPramide (REGLAN) 5 MG tablet Take 1 tablet (5 mg total) by mouth 2 (two) times daily.  180 tablet  3  . mometasone-formoterol (DULERA) 100-5 MCG/ACT AERO Take 2 puffs first thing  in am and then another 2 puffs about 12 hours later.  1 Inhaler  11  . Olopatadine HCl (PATADAY) 0.2 % SOLN Apply 1-2 drops to eye 2 (two) times daily as needed (dry, itchy eyes.).      Marland Kitchen phenol (CHLORASEPTIC) 1.4 % LIQD Use as directed 1 spray in the mouth or throat as needed for throat irritation / pain.      . predniSONE (DELTASONE) 20 MG tablet Take 1 tablet (20 mg total) by mouth daily with breakfast.  7 tablet  0  . promethazine (PHENERGAN) 25 MG suppository Place 25 mg rectally every 6 (six) hours as needed for nausea or vomiting.      . sucralfate (CARAFATE) 1 GM/10ML suspension Take 10 mLs (1 g total) by mouth 2 (two) times daily.  1800 mL  3  . traMADol (ULTRAM) 50 MG tablet Take 1 tablet (50 mg total) by mouth every 8 (eight) hours as needed for moderate pain.  30 tablet  0  . Vitamin D, Ergocalciferol, (DRISDOL) 50000 UNITS CAPS Take 1 capsule (50,000 Units total) by mouth every 7 (seven) days. On Friday  30 capsule  3  . warfarin (COUMADIN) 5 MG tablet Take 5 mg by mouth daily.       Marland Kitchen zolpidem (AMBIEN) 5 MG tablet Take 1 tablet (5 mg total) by mouth at bedtime as needed for sleep.  90 tablet  1   No current facility-administered medications on file prior to visit.    BP 104/70  Pulse 84  Ht 5' (1.524 m)  Wt 115 lb (52.164 kg)  BMI 22.46 kg/m2chart    Objective:   Physical Exam    Constitutional: She is oriented to person, place, and time. She appears well-developed and well-nourished. She is active.  Cardiovascular: Normal rate, regular rhythm, S1 normal, S2 normal, normal heart sounds and normal pulses.   Pulmonary/Chest: Effort normal and breath sounds normal.  Abdominal: Soft. Normal appearance and bowel sounds are normal.  Musculoskeletal:  Kyphosis present.   Neurological: She is alert and oriented to person, place, and time.  Skin: Skin is warm, dry and intact.  Psychiatric: She has a normal mood and affect. Her speech is normal and behavior is normal. Thought content normal.          Assessment & Plan:  80 y.o. White female presents today for a one week follow up.  - Atrial Fibrillation: INR 2.2, will continue current schedule of Coumadin.  - Asthma: Controlled, will continue with Albuterol, Brovana and Pulmicort.  - Pneumonia and Hematemesis: Continue to closely monitor for relapse or exacerbation.  - Edema- Lasix will remain at 40mg  Qday   Education: Continue taking coumadin as scheduled. Will allow to begin driving, but must be accompanied by another person until strength back to normal.   Follow up: Has appointment scheduled with Dr. Arnoldo Morale and [facility].

## 2013-11-04 ENCOUNTER — Telehealth: Payer: Self-pay | Admitting: Internal Medicine

## 2013-11-04 ENCOUNTER — Ambulatory Visit (INDEPENDENT_AMBULATORY_CARE_PROVIDER_SITE_OTHER)
Admission: RE | Admit: 2013-11-04 | Discharge: 2013-11-04 | Disposition: A | Discharge status: Home / Self Care | Payer: Medicare Other | Source: Ambulatory Visit | Attending: Internal Medicine | Admitting: Internal Medicine

## 2013-11-04 ENCOUNTER — Other Ambulatory Visit: Payer: Self-pay | Admitting: Family

## 2013-11-04 DIAGNOSIS — J690 Pneumonitis due to inhalation of food and vomit: Secondary | ICD-10-CM

## 2013-11-04 MED ORDER — CEFDINIR 300 MG PO CAPS: 300.0000 mg | ORAL_CAPSULE | Freq: Two times a day (BID) | ORAL | Status: DC

## 2013-11-04 NOTE — Telephone Encounter (Signed)
Patient Information:  Caller Name: Pedro  Phone: [phone]  Patient: [name], [name]  Gender: Female  DOB: 22-Dec-1928  Age: 80 Years  PCP: Benay Pillow (Adults only)  Office Follow Up:  Does the office need to follow up with this patient?: Yes  Instructions For The Office: Concerned for fever, coughing up blood tinged sputum, coughing till emesis x3.  No appt availabe to be seen in office now.  PLEASE CONTACT PATIENT FOR APPT TIME.  RN Note:  Concerned for fever, coughing up blood tinged sputum, coughing till emesis x3.  No appt availabe to be seen in office now.  PLEASE CONTACT PATIENT FOR APPT TIME.  Symptoms  Reason For Call & Symptoms: Patient states she has "been coughing a lot this morning". Onset last night 11/03/13 with worsening this morning.  Productive cream with blood noted mixed in phlegm. She feels but warm  99.6. Cough until emesis x3.    +runny nose off white, slight sore throat, no ear pain .  Reviewed Health History In EMR: Yes  Reviewed Medications In EMR: Yes  Reviewed Allergies In EMR: Yes  Reviewed Surgeries / Procedures: Yes  Date of Onset of Symptoms: 11/03/2013  Any Fever: Yes  Fever Taken: Oral  Fever Time Of Reading: 11:01:00  Fever Last Reading: 99.6  Guideline(s) Used:  Cough  Disposition Per Guideline:   Go to Office Now  Reason For Disposition Reached:   Increasing ankle swelling  Advice Given:  Coughing Spasms:  Drink warm fluids. Inhale warm mist (Reason: both relax the airway and loosen up the phlegm).  Suck on cough drops or hard candy to coat the irritated throat.  Prevent Dehydration:  Drink adequate liquids.  This will help soothe an irritated or dry throat and loosen up the phlegm.  Call Back If:  Difficulty breathing  You become worse.  RN Overrode Recommendation:  Make Appointment  Concerned for fever, coughing up blood tinged sputum, coughing till emesis x3.  No appt availabe to be seen in office now.  PLEASE CONTACT  PATIENT FOR APPT TIME.

## 2013-11-04 NOTE — Telephone Encounter (Signed)
Left message on machine for pt w/ instructions, per dr. Arnoldo Morale, that she needed to get an x-ray at the [facility], and that he sent in a rx for omnicef 300 mg bid x 10 days

## 2013-11-04 NOTE — Telephone Encounter (Signed)
Per Dr. Arnoldo Morale send in omnicef 300  Mg bid for 10 days and send pt for a chest  Xray. Pt informed and pt is aware to go get a xray done.

## 2013-11-06 ENCOUNTER — Ambulatory Visit (HOSPITAL_COMMUNITY): Payer: Medicare Other | Attending: Cardiovascular Disease | Admitting: Radiology

## 2013-11-06 ENCOUNTER — Encounter: Payer: Self-pay | Admitting: Cardiology

## 2013-11-06 DIAGNOSIS — R0600 Dyspnea, unspecified: Secondary | ICD-10-CM

## 2013-11-06 DIAGNOSIS — R0989 Other specified symptoms and signs involving the circulatory and respiratory systems: Principal | ICD-10-CM | POA: Insufficient documentation

## 2013-11-06 DIAGNOSIS — R0609 Other forms of dyspnea: Secondary | ICD-10-CM | POA: Insufficient documentation

## 2013-11-06 NOTE — Progress Notes (Signed)
Echocardiogram performed.  

## 2013-11-10 NOTE — Patient Instructions (Addendum)
Brovana and pulmicort every 12 hours   Prevar 13 today is the last peumonia shot you'll need  For cough/ congestion > use mucinex dm up to 1200 mg every 12 hours and use flutter valve as much as possible  See Tammy NP w/in 2 weeks for cxr  with all your medications and your niece and your pill  Boxes, even over the counter meds, separated in two separate bags, the ones you take no matter what vs the ones you stop once you feel better and take only as needed when you feel you need them.   Tammy  will generate for you a new user friendly medication calendar that will put [place] all on the same page re: your medication use.     Without this process, it simply isn't possible to assure that we are providing  your outpatient care  with  the attention to detail we feel you deserve.   If we cannot assure that you're getting that kind of care,  then we cannot manage your problem effectively from this clinic.  Once you have seen Tammy and we are sure that we're all on the same page with your medication use she will arrange follow up with me

## 2013-11-11 ENCOUNTER — Telehealth: Payer: Self-pay | Admitting: Internal Medicine

## 2013-11-11 ENCOUNTER — Ambulatory Visit (INDEPENDENT_AMBULATORY_CARE_PROVIDER_SITE_OTHER): Payer: Medicare Other | Admitting: General Practice

## 2013-11-11 ENCOUNTER — Encounter: Payer: Self-pay | Admitting: Internal Medicine

## 2013-11-11 ENCOUNTER — Ambulatory Visit (INDEPENDENT_AMBULATORY_CARE_PROVIDER_SITE_OTHER): Payer: Medicare Other | Admitting: Internal Medicine

## 2013-11-11 VITALS — BP 114/70 | HR 76 | Temp 98.4°F | Ht 60.0 in | Wt 115.0 lb

## 2013-11-11 DIAGNOSIS — J690 Pneumonitis due to inhalation of food and vomit: Secondary | ICD-10-CM

## 2013-11-11 DIAGNOSIS — I10 Essential (primary) hypertension: Secondary | ICD-10-CM

## 2013-11-11 DIAGNOSIS — Z5181 Encounter for therapeutic drug level monitoring: Secondary | ICD-10-CM

## 2013-11-11 DIAGNOSIS — I4891 Unspecified atrial fibrillation: Secondary | ICD-10-CM

## 2013-11-11 DIAGNOSIS — M5382 Other specified dorsopathies, cervical region: Secondary | ICD-10-CM

## 2013-11-11 LAB — POCT INR: INR: 1.7

## 2013-11-11 MED ORDER — LEVOFLOXACIN 750 MG PO TABS: 750.0000 mg | ORAL_TABLET | Freq: Every morning | ORAL | Status: DC

## 2013-11-11 MED ORDER — CEFTRIAXONE SODIUM 1 G IJ SOLR
1.0000 g | Freq: Once | INTRAMUSCULAR | Status: AC
  Administered 2013-11-11: 1 g via INTRAMUSCULAR

## 2013-11-11 MED ORDER — HYDROCOD POLST-CHLORPHEN POLST 10-8 MG/5ML PO LQCR
5.0000 mL | Freq: Two times a day (BID) | ORAL | Status: DC
Start: 2013-11-11 — End: 2013-12-02

## 2013-11-11 NOTE — Progress Notes (Signed)
Subjective:    Patient ID: [name], female    DOB: 1928/09/08, 80 y.o.   MRN: [id number]  HPI treatment failure from pneumonia from aspiration   Review of Systems    Patient presents for yearly preventative medicine examination.  Past Medical History  Diagnosis Date  . GERD (gastroesophageal reflux disease)   . Osteoporosis   . Postmenopausal HRT (hormone replacement therapy)   . Anemia   . Hypertension   . Allergy   . Asthma   . Shortness of breath   . PONV (postoperative nausea and vomiting)   . Pneumonia      Hx of pneumonia,68months ago  . Dysrhythmia     a fib  . Headache(784.0)   . Esophageal cancer     removed esophagus stomach pulled up    History   Social History  . Marital Status: Single    Spouse Name: N/A    Number of Children: 1  . Years of Education: N/A   Occupational History  . Retired    Social History Main Topics  . Smoking status: Never Smoker   . Smokeless tobacco: Never Used  . Alcohol Use: No  . Drug Use: No  . Sexual Activity: Yes   Other Topics Concern  . Not on file   Social History Narrative   DAILY CAFFEINE    USE    Past Surgical History  Procedure Laterality Date  . Left oophorectomy    . Esophageal removal of cancer,gastric ppull-thru 1993    . Cholecystectomy    . Rt arm fx    . Esophagogastroduodenoscopy  10/19/2011    Procedure: ESOPHAGOGASTRODUODENOSCOPY (EGD);  Surgeon: Milus Banister, MD;  Location: [facility];  Service: Endoscopy;  Laterality: N/A;  . Balloon dilation  10/19/2011    Procedure: BALLOON DILATION;  Surgeon: Milus Banister, MD;  Location: [facility];  Service: Endoscopy;  Laterality: N/A;  . Esophagogastroduodenoscopy  11/07/2011    Procedure: ESOPHAGOGASTRODUODENOSCOPY (EGD);  Surgeon: Ladene Artist, MD,FACG;  Location: [facility] ENDOSCOPY;  Service: Endoscopy;  Laterality: N/A;  . Esophagogastroduodenoscopy  11/22/2011    Procedure: ESOPHAGOGASTRODUODENOSCOPY (EGD);  Surgeon: Inda Castle, MD;  Location: [facility];  Service: Endoscopy;  Laterality: N/A;  . Esophagogastroduodenoscopy  11/24/2011    Procedure: ESOPHAGOGASTRODUODENOSCOPY (EGD);  Surgeon: Inda Castle, MD;  Location: [facility];  Service: Endoscopy;  Laterality: N/A;  with removable duodenal stent (actually using esophageal partially covered 23X15 located in locked supply room  . Duodenal stent placement  11/24/2011    Procedure: DUODENAL STENT PLACEMENT;  Surgeon: Inda Castle, MD;  Location: [facility];  Service: Endoscopy;  Laterality: N/A;  . Video bronchoscopy Bilateral 01/18/2013    Procedure: VIDEO BRONCHOSCOPY WITHOUT FLUORO;  Surgeon: Tanda Rockers, MD;  Location: [facility] ENDOSCOPY;  Service: Cardiopulmonary;  Laterality: Bilateral;  . Esophagogastroduodenoscopy N/A 02/11/2013    Procedure: ESOPHAGOGASTRODUODENOSCOPY (EGD);  Surgeon: Inda Castle, MD;  Location: [facility];  Service: Endoscopy;  Laterality: N/A;  . Duodenal stent placement N/A 02/11/2013    Procedure: DUODENAL STENT PLACEMENT;  Surgeon: Inda Castle, MD;  Location: [facility];  Service: Endoscopy;  Laterality: N/A;  . Esophagogastroduodenoscopy N/A 09/11/2013    Procedure: ESOPHAGOGASTRODUODENOSCOPY (EGD);  Surgeon: Gatha Mayer, MD;  Location: [facility];  Service: Endoscopy;  Laterality: N/A;    Family History  Problem Relation Age of Onset  . Cervical cancer Mother   . Heart disease Father     MI  .  Coronary artery disease Brother   . Hypotension Sister   . Colon cancer Neg Hx   . Colon polyps Sister   . Pancreatic cancer      1/2 sister  . Coronary artery disease Sister     pacemaker  . Asthma Brother   . Asthma Sister     Allergies  Allergen Reactions  . Erythromycin Ethylsuccinate     Irregular pulse rate  . Prednisone     "makes me hyper"  . Doxycycline Rash    Current Outpatient Prescriptions on File Prior to Visit  Medication Sig Dispense Refill  . acetaminophen (TYLENOL) 500 MG tablet  Take 500 mg by mouth daily as needed for headache.      . albuterol (PROVENTIL HFA;VENTOLIN HFA) 108 (90 BASE) MCG/ACT inhaler Inhale 2 puffs into the lungs every 6 (six) hours as needed. For wheezing  1 Inhaler  11  . arformoterol (BROVANA) 15 MCG/2ML NEBU Take 2 mLs (15 mcg total) by nebulization 2 (two) times daily. Dx: 493.00  120 mL  12  . budesonide (PULMICORT) 0.25 MG/2ML nebulizer solution Take 2 mLs (0.25 mg total) by nebulization 2 (two) times daily. Dx: 493.00  120 mL  12  . cefdinir (OMNICEF) 300 MG capsule Take 1 capsule (300 mg total) by mouth 2 (two) times daily.  20 capsule  0  . dextromethorphan-guaiFENesin (MUCINEX DM) 30-600 MG per 12 hr tablet Take 1 tablet by mouth every morning.  30 tablet  0  . digoxin (LANOXIN) 0.125 MG tablet Take 1 tablet (0.125 mg total) by mouth daily.  90 tablet  3  . Fe Fum-FA-B Cmp-C-Zn-Mg-Mn-Cu (HEMOCYTE PLUS) 106-1 MG CAPS Take by mouth.      . furosemide (LASIX) 20 MG tablet TAKE 1 TABLET BY MOUTH DAILY  30 tablet  3  . lansoprazole (PREVACID SOLUTAB) 30 MG disintegrating tablet Take 2 tablets (60 mg total) by mouth 2 (two) times daily.  360 tablet  3  . levocetirizine (XYZAL) 5 MG tablet Take 5 mg by mouth every evening. Patient takes only as needed      . magnesium oxide (MAG-OX) 400 (241.3 MG) MG tablet Take 0.5 tablets (200 mg total) by mouth 2 (two) times daily.  60 tablet  0  . metoCLOPramide (REGLAN) 5 MG tablet Take 1 tablet (5 mg total) by mouth 2 (two) times daily.  180 tablet  3  . mometasone-formoterol (DULERA) 100-5 MCG/ACT AERO Take 2 puffs first thing in am and then another 2 puffs about 12 hours later.  1 Inhaler  11  . Olopatadine HCl (PATADAY) 0.2 % SOLN Apply 1-2 drops to eye 2 (two) times daily as needed (dry, itchy eyes.).      Marland Kitchen phenol (CHLORASEPTIC) 1.4 % LIQD Use as directed 1 spray in the mouth or throat as needed for throat irritation / pain.      . promethazine (PHENERGAN) 25 MG suppository Place 25 mg rectally every 6  (six) hours as needed for nausea or vomiting.      . sucralfate (CARAFATE) 1 GM/10ML suspension Take 10 mLs (1 g total) by mouth 2 (two) times daily.  1800 mL  3  . traMADol (ULTRAM) 50 MG tablet Take 1 tablet (50 mg total) by mouth every 8 (eight) hours as needed for moderate pain.  30 tablet  0  . Vitamin D, Ergocalciferol, (DRISDOL) 50000 UNITS CAPS Take 1 capsule (50,000 Units total) by mouth every 7 (seven) days. On Friday  30 capsule  3  .  warfarin (COUMADIN) 5 MG tablet Take 5 mg by mouth daily.       Marland Kitchen zolpidem (AMBIEN) 5 MG tablet Take 1 tablet (5 mg total) by mouth at bedtime as needed for sleep.  90 tablet  1  . furosemide (LASIX) 40 MG tablet Take 1 tablet (40 mg total) by mouth daily.  30 tablet  0   No current facility-administered medications on file prior to visit.    BP 114/70  Pulse 76  Temp(Src) 98.4 F (36.9 C) (Oral)  Ht 5' (1.524 m)  Wt 115 lb (52.164 kg)  BMI 22.46 kg/m2     Objective:   Physical Exam  Pale and fragile appearing  Crackle at left base pronounced tremor Refer to PT [facility] for neck exercises due to weakness and aspirtation  weigth stable        Assessment & Plan:  Chronic aspiration pneumonia and chronic GERD   Change antibiotic to Levaquin 750 for10 days and add probiotics  Follow up for PT  And OT for neck pain

## 2013-11-11 NOTE — Progress Notes (Signed)
Pre visit review using our clinic review tool, if applicable. No additional management support is needed unless otherwise documented below in the visit note. 

## 2013-11-11 NOTE — Patient Instructions (Addendum)
Continue 20 of lasix for now  And hold the diovan and atenolol  If the pressure gets above 150  Resume the tenomin   If the legs swell increase the lasix to 40  Stay off the diovan  Stay on the current dose of coumadin

## 2013-11-11 NOTE — Telephone Encounter (Signed)
Since pt has a lot of draninage, is it ok for her to take musinex? Pt already has some at home. Pt states she is sorry she forgot to ask earlier.

## 2013-11-11 NOTE — Addendum Note (Signed)
Addended by: Harl Bowie on: 11/11/2013 01:58 PM   Modules accepted: Orders

## 2013-11-12 ENCOUNTER — Ambulatory Visit (INDEPENDENT_AMBULATORY_CARE_PROVIDER_SITE_OTHER): Payer: Medicare Other | Admitting: Internal Medicine

## 2013-11-12 ENCOUNTER — Telehealth: Payer: Self-pay | Admitting: Internal Medicine

## 2013-11-12 ENCOUNTER — Encounter: Payer: Self-pay | Admitting: Internal Medicine

## 2013-11-12 VITALS — BP 100/60 | HR 93 | Temp 98.2°F | Ht 60.0 in | Wt 116.0 lb

## 2013-11-12 DIAGNOSIS — J45909 Unspecified asthma, uncomplicated: Secondary | ICD-10-CM

## 2013-11-12 DIAGNOSIS — Z23 Encounter for immunization: Secondary | ICD-10-CM

## 2013-11-12 DIAGNOSIS — J189 Pneumonia, unspecified organism: Secondary | ICD-10-CM

## 2013-11-12 MED ORDER — FLUTTER DEVI: Status: DC

## 2013-11-12 NOTE — Progress Notes (Signed)
Subjective:     Patient ID: [name], female   DOB: [birth year] MRN: [id number]  Brief patient profile:  6 yowf never smoker with bad gerd >  hx of esophageal cancer w/ prev. esophagectomy with gastric pull- through by [name] referred 01/07/2013 to pulmonary  by Dr Colin Benton for new cough x early 2014   HPI 01/07/2013 1st pulmonary eval in EMR era previously eval   remotely (pre EMR) by Joya Gaskins with prednisone responsive cough  Now with recurrent cough x 5 years usually better with abx but never resoved then about 5 months ago more persistent, daily,  and not better with abx assoc with recurrent dysphagia s/p stent which relieved the dysphagia. Cough is day = night, prod of mod abmt so mucoid sputum and intermittent  Blood up to a tbsp at a time.while maintained on coumadin for afib.. rec Prednisone 10 mg take  4 each am x 2 days,   2 each am x 2 days,  1 each am x 2 days and stop Hold coumadin when coughing up blood   01/15/2013 f/u ov/[name] re cough/hemoptysis/ still on coumadin Chief Complaint  Patient presents with  . Follow-up    Pt states her cough is unchnaged since the last visit. Still waking up at least twice in the night with hemoptysis.   no epistaxis, less than 2 tbsp of blood per day. Mild dyspnea with acitivities of daily living. >FOB 01/18/13 no bleeding identified, completely clear airways butcoughed up bilious mucus during procedure ? From stomach  01/25/2013 Acute OV / NP Complains of increased hemoptysis with BRB x2 days with increased SOB.  Noticed several episodes last 2 days with blood tinged mucus-bright red. Tsp at times. Mostly clear mucus w/ no prulent mucus .  reports hemoptysis begins as "straight blood" then turns to clear mucus . No bleeding today.  Pt complains over last 3 months has had blood tinged mucus with cough. Was referred to pulmonary 6/9. CT chest angio neg for PE  , Patchy ground-glass attenuation airspace disease in the  dependent distribution  of the bilateral lower lobes with associated  debris within segmental and subsegmental lower lobe bronchi.  Findings are most consistent chronic subclinical aspiration versus  bilateral bronchopneumonia. Chronic aspiration is favored. She was on chronic coumadin for atrial fib . This was held due to persistent hemoptysis. She underwent FOB on 6/20 w/ cytology neg for malignant cells. No source of bleeding found . Did have gerd event during procedure.  She has no  Fever, discolored mucus or edema. No syncope or palpitations no chest pain.  Today labs show nml hbg/platelets and INR at 1.1.  Mild elevated wbc at 12.9k , w/ mild left shift  Appetite is at her baseline w/ no vomiting.  No diarrhea. No urinary symptoms.  CXR w/ no acute changes rec Begin Augmentin 875mg  Twice daily  For 7 days  Mucinex DM Twice daily  As needed  Cough/congestion  Fluids and rest  If coughing of blood does not resolve or worsens will need ov or go to ER.  Remain off coumadin.  Avoid aspirin products hgb 13.2 > 13.7    01/28/2013 f/u ov/[name]  Chief Complaint  Patient presents with  . 3 day follow up    Hemoptysis has improved.  Did cough up a small amount of dry looking blood this morning.  Has SOb when walking [place] -- gradually worsening over the past 5 months.   no dysphagia, sleeps sitting up. Breathing  better p albuterol despite poor hfa/see a/p  >augmentin rx and dulera rx   02/25/2013 Follow up Asthma/Cough/Hemoptysis/Chronic Aspiration Patient returns for a one-month followup Patient was seen one month ago with persistent cough. She was finishing up and off been taper, which she reports her cough is substantially, improved. She's had no further hemoptysis and is now back on Coumadin therapy. INR last week. Was 3.1 She says, that she is feeling better. She continues to have, difficulty using her dulera inhaler. She has a tremor and it makes it difficult for her to use her inhaler. We discussed using  nebs instead.   She recently underwent an upper endoscopy with Dr. Deatra Ina. She was found to have a recurrent gastric outlet obstruction, secondary to stenosis at the very distal end of her duodenal stent. She underwent a successful placement of her duodenal stent beyond the stenotic area. This was placed on July 14.  Does complain that over the last 3 days, that she is noticed some lower extremity swelling, especially along her ankles, and feet. She denies any orthopnea, increased shortness, of breath, or fever. She is quite concerned about the swelling in her ankles. She has an appointment with her family care doctor in 2 days, but requests a prescription to help with the swelling. She is currently on hydrochlorothiazide 6.25 mg daily. Of note, this is a combination drug with her bisoprolol. rec Stop Dulera  Begin Budesonide and Brovana Neb Twice daily   Rinse mouth out after nebs.  Use Lasix 20mg  x 1 today .  I will call with lab results.    11/04/13 rx omnicef x 10 d fever, cough with yellow no better  11/11/13 levaquin x 10   11/12/2013 f/u ov/[name] re: started levaquin 11/11/13 "sick x 3-4 months"/ confused with meds/ niece has taken them over and uses pill box but no master list / not using laba/ics bid as maint as rec  Chief Complaint  Patient presents with  . Follow-up    Pt reports has had PNA x 2 since her last visit here in June 2014. She states that her breathing has been worse for the past 3-4 months. She also c/o increased cough- prod with moderate yellow sputum.   Denies overt hb/ reflux/aspiration. Not limited by breathing from desired activities  But very sedentary    No obvious day to day or daytime variabilty or assoc   cp or chest tightness, subjective wheeze overt sinus or hb symptoms. No unusual exp hx or h/o childhood pna/ asthma or knowledge of premature birth.  Sleeping ok without nocturnal  or early am exacerbation  of respiratory  c/o's or need for noct saba. Also  denies any obvious fluctuation of symptoms with weather or environmental changes or other aggravating or alleviating factors except as outlined above   Current Medications, Allergies, Complete Past Medical History, Past Surgical History, Family History, and Social History were reviewed in [employer] record.  ROS  The following are not active complaints unless bolded sore throat, dysphagia, dental problems, itching, sneezing,  nasal congestion or excess/ purulent secretions, ear ache,   fever, chills, sweats, unintended wt loss, pleuritic or exertional cp, hemoptysis,  orthopnea pnd or leg swelling, presyncope, palpitations, heartburn, abdominal pain, anorexia, nausea, vomiting, diarrhea  or change in bowel or urinary habits, change in stools or urine, dysuria,hematuria,  rash, arthralgias, visual complaints, headache, numbness weakness or ataxia or problems with walking or coordination,  change in mood/affect or memory.  Objective:   Physical Exam   01/15/2013      119  >>119 01/25/2013 > 01/28/2013 120 >119 02/25/2013 > 11/12/2013  116   HEENT: nl dentition, turbinates, and orophanx. Nl external ear canals without cough reflex   NECK :  without JVD/Nodes/TM/ nl carotid upstrokes bilaterally   LUNGS: no acc muscle use, diminshed BS in bases    CV:  RRR  no s3 or murmur or increase in P2, 1+ pitting edema bilaterally. No redness or calf tenderness.  Neg homans sign . Venous insufficiency changes   ABD:  soft and nontender with nl excursion in the supine position. No bruits or organomegaly, bowel sounds nl  MS:  warm without deformities, calf tenderness, cyanosis or clubbing  SKIN: warm and dry without lesions         11/04/13 1. Stable cardiomegaly. No CHF.  2. Left lower lobe atelectasis versus infiltrate.  3. Thoracoabdominal aortic stent graft.

## 2013-11-12 NOTE — Telephone Encounter (Addendum)
Sommers RN from [facility] is calling to inform md that patient started service today and home health  Nurse will be coming out twice this week and once a wk for 3 wks.also pt will have a visit from  physical therapy for evaluation

## 2013-11-13 ENCOUNTER — Telehealth: Payer: Self-pay | Admitting: Internal Medicine

## 2013-11-13 ENCOUNTER — Telehealth: Payer: Self-pay | Admitting: Critical Care Medicine

## 2013-11-13 NOTE — Telephone Encounter (Signed)
Called and made niece aware. Nothing further needed

## 2013-11-13 NOTE — Telephone Encounter (Signed)
Arnold from [facility] is calling needing verbal order for physical therapy for twice a week for 3 weeks for the pt.

## 2013-11-13 NOTE — Telephone Encounter (Signed)
That's not what I said, but I did comment it was a long list  rec Be sure AVS corresponds to exactly what she's taking and if it's not then let us know what the discrepancies are  Avoid alternative/otc's but if she's going to use them she must bring them back with her and show Tammy what they are

## 2013-11-13 NOTE — Assessment & Plan Note (Addendum)
At risk of recurrent asp > rec complete course of levaquin and f/u in 2 weeks ? Needs repeat swallowing eval   prevnar given today

## 2013-11-13 NOTE — Assessment & Plan Note (Signed)
DDX of  difficult airways managment all start with A and  include Adherence, Ace Inhibitors, Acid Reflux, Active Sinus Disease, Alpha 1 Antitripsin deficiency, Anxiety masquerading as Airways dz,  ABPA,  allergy(esp in young), Aspiration (esp in elderly), Adverse effects of DPI,  Active smokers, plus two Bs  = Bronchiectasis and Beta blocker use..and one C= CHF  Adherence is always the initial "prime suspect" and is a multilayered concern that requires a "trust but verify" approach in every patient - starting with knowing how to use medications, especially inhalers, correctly, keeping up with refills and understanding the fundamental difference between maintenance and prns vs those medications only taken for a very short course and then stopped and not refilled.  - not using brov/bud as rec (bid) and also not confident we have accurate med list   To keep things simple, I have asked the patient to first separate medicines that are perceived as maintenance, that is to be taken daily "no matter what", from those medicines that are taken on only on an as-needed basis and I have given the patient examples of both, and then return to see our NP to generate a  detailed  medication calendar which should be followed until the next physician sees the patient and updates it.

## 2013-11-13 NOTE — Telephone Encounter (Signed)
Spoke with pt niece. She reports pt advised her MW stated pt niece needed to call us and give an explanation as to why pt was on so much medication.  Niece wants [place] to know that whatever pt is on her PCP has prescribed as well.  Please advise MW thanks

## 2013-11-15 NOTE — Telephone Encounter (Signed)
[place] notified that pt can have pt two times a week for 3 weeks

## 2013-11-22 DIAGNOSIS — J690 Pneumonitis due to inhalation of food and vomit: Secondary | ICD-10-CM

## 2013-11-22 HISTORY — DX: Pneumonitis due to inhalation of food and vomit: J69.0

## 2013-11-28 ENCOUNTER — Encounter: Payer: Medicare Other | Admitting: Adult Health

## 2013-11-28 ENCOUNTER — Telehealth: Payer: Self-pay | Admitting: Adult Health

## 2013-11-28 NOTE — Telephone Encounter (Signed)
Sorry to hear that we couldn't get to her in time today so  just come in once with ov with me but I really need to see the pt with whoever's giving the meds because the "devil's in the detail" - if she's doing great I don't even need to see her - if not, I want to have the opportunity to see her and tweek the meds and can't do it over the phone.

## 2013-11-28 NOTE — Telephone Encounter (Signed)
Spoke with the pt's caregiver  I advised of recs per MW and she verbalized understanding She reports that the pt is doing great and she will call for appt if needed and will attend since she helps with meds

## 2013-11-28 NOTE — Telephone Encounter (Signed)
Called and spoke with Patti--pts caregiver.  She stated that she drove the pt 40 mins to get her for her appt today and had to pay for child care and they were told that they would have to reschedule her appt---she is rescheduled for next week for med cal with TP.  Caregiver stated that she will have to get child care again for this appt next week and drive 40 mins here again.  She stated that the pts med list has been updated with Dr. Arnoldo Morale office at her last visit there and Precious Bard stated that she takes care of all of her meds and placed these in the pill box for the pt.  Patti wanted to know if she could just go over the med list with someone in the office and it be updated like this since she does not feel that the pt needs to come back in for this.  MW please advise. Thanks  Allergies  Allergen Reactions  . Erythromycin Ethylsuccinate     Irregular pulse rate  . Prednisone     "makes me hyper"  . Doxycycline Rash   Current Outpatient Prescriptions on File Prior to Visit  Medication Sig Dispense Refill  . acetaminophen (TYLENOL) 500 MG tablet Take 500 mg by mouth daily as needed for headache.      . albuterol (PROVENTIL HFA;VENTOLIN HFA) 108 (90 BASE) MCG/ACT inhaler Inhale 2 puffs into the lungs every 6 (six) hours as needed. For wheezing  1 Inhaler  11  . arformoterol (BROVANA) 15 MCG/2ML NEBU Take 15 mcg by nebulization daily. Dx: 493.00      . budesonide (PULMICORT) 0.25 MG/2ML nebulizer solution Take 0.25 mg by nebulization daily. Dx: 493.00      . chlorpheniramine-HYDROcodone (TUSSIONEX PENNKINETIC ER) 10-8 MG/5ML LQCR Take 5 mLs by mouth 2 (two) times daily.  115 mL  0  . dextromethorphan-guaiFENesin (MUCINEX DM) 30-600 MG per 12 hr tablet Take 1 tablet by mouth every morning.  30 tablet  0  . digoxin (LANOXIN) 0.125 MG tablet Take 1 tablet (0.125 mg total) by mouth daily.  90 tablet  3  . Fe Fum-FA-B Cmp-C-Zn-Mg-Mn-Cu (HEMOCYTE PLUS) 106-1 MG CAPS Take by mouth.      . furosemide  (LASIX) 40 MG tablet Take 1 tablet (40 mg total) by mouth daily.  30 tablet  0  . lansoprazole (PREVACID SOLUTAB) 30 MG disintegrating tablet Take 2 tablets (60 mg total) by mouth 2 (two) times daily.  360 tablet  3  . levocetirizine (XYZAL) 5 MG tablet Take 5 mg by mouth every evening. Patient takes only as needed      . levofloxacin (LEVAQUIN) 750 MG tablet Take 1 tablet (750 mg total) by mouth every morning.  10 tablet  0  . magnesium oxide (MAG-OX) 400 (241.3 MG) MG tablet Take 0.5 tablets (200 mg total) by mouth 2 (two) times daily.  60 tablet  0  . metoCLOPramide (REGLAN) 5 MG tablet Take 1 tablet (5 mg total) by mouth 2 (two) times daily.  180 tablet  3  . mometasone-formoterol (DULERA) 100-5 MCG/ACT AERO Take 2 puffs first thing in am and then another 2 puffs about 12 hours later.  1 Inhaler  11  . Olopatadine HCl (PATADAY) 0.2 % SOLN Apply 1-2 drops to eye 2 (two) times daily as needed (dry, itchy eyes.).      Marland Kitchen phenol (CHLORASEPTIC) 1.4 % LIQD Use as directed 1 spray in the mouth or throat as needed for throat  irritation / pain.      . promethazine (PHENERGAN) 25 MG suppository Place 25 mg rectally every 6 (six) hours as needed for nausea or vomiting.      Marland Kitchen Respiratory Therapy Supplies (FLUTTER) DEVI Take as directed  1 each  0  . sucralfate (CARAFATE) 1 GM/10ML suspension Take 10 mLs (1 g total) by mouth 2 (two) times daily.  1800 mL  3  . traMADol (ULTRAM) 50 MG tablet Take 1 tablet (50 mg total) by mouth every 8 (eight) hours as needed for moderate pain.  30 tablet  0  . Vitamin D, Ergocalciferol, (DRISDOL) 50000 UNITS CAPS Take 1 capsule (50,000 Units total) by mouth every 7 (seven) days. On Friday  30 capsule  3  . warfarin (COUMADIN) 5 MG tablet Take 5 mg by mouth daily.       Marland Kitchen zolpidem (AMBIEN) 5 MG tablet Take 1 tablet (5 mg total) by mouth at bedtime as needed for sleep.  90 tablet  1   No current facility-administered medications on file prior to visit.

## 2013-11-29 ENCOUNTER — Telehealth: Payer: Self-pay | Admitting: Internal Medicine

## 2013-11-29 DIAGNOSIS — Z9181 History of falling: Secondary | ICD-10-CM

## 2013-11-29 NOTE — Telephone Encounter (Signed)
Pt's niece is calling stating that the pt is wanting to speak with Padonda directly. Dr. Arnoldo Morale is her pcp, however she has been seeing Padonda and states Padonda informed her to contact her if anything changed with her. Pt is scheduled to see Padonda on 12/09/13, pt is currently experiencing the coughing and congestion again pt has been off the antibiotic for two days. Also wants to know if Abby Potash still wants her to do more physical therapy. Pt no longer has a home care nurse.

## 2013-11-29 NOTE — Telephone Encounter (Signed)
Advised patient to call Dr. Gustavus Bryant office and let them know about her coughing concern since she was suppose to follow-up. I have placed an order for PT to be continued.

## 2013-11-29 NOTE — Telephone Encounter (Signed)
[facility] [place] is requesting re-fills on the following: zolpidem (AMBIEN) 5 MG tablet Vitamin D, Ergocalciferol, (DRISDOL) 50000 UNITS CAPS

## 2013-12-02 ENCOUNTER — Encounter: Payer: Medicare Other | Admitting: Adult Health

## 2013-12-02 ENCOUNTER — Ambulatory Visit (INDEPENDENT_AMBULATORY_CARE_PROVIDER_SITE_OTHER): Payer: Medicare Other | Admitting: Adult Health

## 2013-12-02 ENCOUNTER — Encounter: Payer: Self-pay | Admitting: Adult Health

## 2013-12-02 ENCOUNTER — Ambulatory Visit (INDEPENDENT_AMBULATORY_CARE_PROVIDER_SITE_OTHER)
Admission: RE | Admit: 2013-12-02 | Discharge: 2013-12-02 | Disposition: A | Discharge status: Home / Self Care | Payer: Medicare Other | Source: Ambulatory Visit | Attending: Adult Health | Admitting: Adult Health

## 2013-12-02 ENCOUNTER — Telehealth: Payer: Self-pay | Admitting: Adult Health

## 2013-12-02 ENCOUNTER — Other Ambulatory Visit (INDEPENDENT_AMBULATORY_CARE_PROVIDER_SITE_OTHER): Payer: Medicare Other

## 2013-12-02 VITALS — BP 104/66 | HR 89 | Temp 98.1°F | Ht 60.0 in | Wt 112.0 lb

## 2013-12-02 DIAGNOSIS — R042 Hemoptysis: Secondary | ICD-10-CM

## 2013-12-02 DIAGNOSIS — J45909 Unspecified asthma, uncomplicated: Secondary | ICD-10-CM

## 2013-12-02 DIAGNOSIS — J441 Chronic obstructive pulmonary disease with (acute) exacerbation: Secondary | ICD-10-CM

## 2013-12-02 LAB — PROTIME-INR
INR: 2.1 ratio — ABNORMAL HIGH (ref 0.8–1.0)
Prothrombin Time: 22.7 s — ABNORMAL HIGH (ref 9.6–13.1)

## 2013-12-02 MED ORDER — ZOLPIDEM TARTRATE 5 MG PO TABS: 5.0000 mg | ORAL_TABLET | Freq: Every evening | ORAL | Status: DC | PRN

## 2013-12-02 MED ORDER — VIACTIV MULTI-VITAMIN PO CHEW: 1.0000 | CHEWABLE_TABLET | Freq: Every day | ORAL | Status: DC

## 2013-12-02 MED ORDER — BUDESONIDE 0.25 MG/2ML IN SUSP: 0.2500 mg | Freq: Two times a day (BID) | RESPIRATORY_TRACT | Status: DC

## 2013-12-02 MED ORDER — HYDROCOD POLST-CHLORPHEN POLST 10-8 MG/5ML PO LQCR: 5.0000 mL | Freq: Two times a day (BID) | ORAL | Status: DC | PRN

## 2013-12-02 MED ORDER — PREDNISONE 10 MG PO TABS: ORAL_TABLET | ORAL | Status: DC

## 2013-12-02 MED ORDER — ALBUTEROL SULFATE HFA 108 (90 BASE) MCG/ACT IN AERS: 2.0000 | INHALATION_SPRAY | Freq: Four times a day (QID) | RESPIRATORY_TRACT | Status: DC | PRN

## 2013-12-02 MED ORDER — ARFORMOTEROL TARTRATE 15 MCG/2ML IN NEBU: 15.0000 ug | INHALATION_SOLUTION | Freq: Two times a day (BID) | RESPIRATORY_TRACT | Status: DC

## 2013-12-02 MED ORDER — HEMOCYTE PLUS 106-1 MG PO CAPS: 1.0000 | ORAL_CAPSULE | Freq: Two times a day (BID) | ORAL | Status: DC

## 2013-12-02 MED ORDER — DM-GUAIFENESIN ER 30-600 MG PO TB12: 1.0000 | ORAL_TABLET | Freq: Two times a day (BID) | ORAL | Status: DC | PRN

## 2013-12-02 MED ORDER — CYANOCOBALAMIN 1000 MCG PO TABS: 500.0000 ug | ORAL_TABLET | Freq: Every day | ORAL | Status: DC

## 2013-12-02 MED ORDER — ACETAMINOPHEN 500 MG PO TABS: 500.0000 mg | ORAL_TABLET | Freq: Four times a day (QID) | ORAL | Status: DC | PRN

## 2013-12-02 NOTE — Telephone Encounter (Signed)
Called and spoke with patti and she stated that the b12 is 5023mcg daily.  i have added this to the pts med list but patti wanted to make sure that this needed to be kept the same.  She also stated that the the pt has the albuterol HFA at home but they have no other refills on this medication.  Can we send in refills?   And they wanted to know the results of the pts protime.  TP please advise. Thanks  Allergies  Allergen Reactions  . Erythromycin Ethylsuccinate     Irregular pulse rate  . Prednisone     "makes me hyper"  . Doxycycline Rash    Current Outpatient Prescriptions on File Prior to Visit  Medication Sig Dispense Refill  . acetaminophen (TYLENOL) 500 MG tablet Take 1 tablet (500 mg total) by mouth every 6 (six) hours as needed for headache.  30 tablet    . albuterol (PROVENTIL HFA;VENTOLIN HFA) 108 (90 BASE) MCG/ACT inhaler Inhale 2 puffs into the lungs every 6 (six) hours as needed. For wheezing  1 Inhaler  11  . arformoterol (BROVANA) 15 MCG/2ML NEBU Take 2 mLs (15 mcg total) by nebulization 2 (two) times daily. Dx: 493.00  120 mL    . budesonide (PULMICORT) 0.25 MG/2ML nebulizer solution Take 2 mLs (0.25 mg total) by nebulization 2 (two) times daily. Dx: 493.00  60 mL    . chlorpheniramine-HYDROcodone (TUSSIONEX PENNKINETIC ER) 10-8 MG/5ML LQCR Take 5 mLs by mouth every 12 (twelve) hours as needed for cough.  115 mL  0  . cyanocobalamin ([facility] VITAMIN B12) 1000 MCG tablet Take 0.5 tablets (500 mcg total) by mouth daily.      Marland Kitchen dextromethorphan-guaiFENesin (MUCINEX DM) 30-600 MG per 12 hr tablet Take 1 tablet by mouth 2 (two) times daily as needed for cough.  30 tablet  0  . digoxin (LANOXIN) 0.125 MG tablet Take 1 tablet (0.125 mg total) by mouth daily.  90 tablet  3  . Fe Fum-FA-B Cmp-C-Zn-Mg-Mn-Cu (HEMOCYTE PLUS) 106-1 MG CAPS Take 1 tablet by mouth 2 (two) times daily.      . furosemide (LASIX) 40 MG tablet Take 1 tablet (40 mg total) by mouth daily.  30 tablet  0  . lansoprazole  (PREVACID SOLUTAB) 30 MG disintegrating tablet Take 2 tablets (60 mg total) by mouth 2 (two) times daily.  360 tablet  3  . metoCLOPramide (REGLAN) 5 MG tablet Take 1 tablet (5 mg total) by mouth 2 (two) times daily.  180 tablet  3  . Multiple Vitamins-Calcium (VIACTIV MULTI-VITAMIN) CHEW Chew 1 each by mouth daily.    0  . Olopatadine HCl (PATADAY) 0.2 % SOLN Apply 1-2 drops to eye 2 (two) times daily as needed (dry, itchy eyes.).      Marland Kitchen predniSONE (DELTASONE) 10 MG tablet 4 tabs for 2 days,  2 tabs for 2 days, then 1 tab for 2 days, then stop  15 tablet  0  . promethazine (PHENERGAN) 25 MG suppository Place 25 mg rectally every 6 (six) hours as needed for nausea or vomiting.      Marland Kitchen Respiratory Therapy Supplies (FLUTTER) DEVI Take as directed  1 each  0  . sucralfate (CARAFATE) 1 GM/10ML suspension Take 10 mLs (1 g total) by mouth 2 (two) times daily.  1800 mL  3  . traMADol (ULTRAM) 50 MG tablet Take 1 tablet (50 mg total) by mouth every 8 (eight) hours as needed for moderate pain.  [place]  tablet  0  . Vitamin D, Ergocalciferol, (DRISDOL) 50000 UNITS CAPS Take 1 capsule (50,000 Units total) by mouth every 7 (seven) days. On Friday  30 capsule  3  . warfarin (COUMADIN) 5 MG tablet Take 5 mg by mouth daily.       Marland Kitchen zolpidem (AMBIEN) 5 MG tablet Take 1 tablet (5 mg total) by mouth at bedtime as needed for sleep.  90 tablet  1   No current facility-administered medications on file prior to visit.

## 2013-12-02 NOTE — Assessment & Plan Note (Signed)
Recurrent hemoptysis on coumadin  Check cxr and INR today  Hold coumadin briefly today  Advised to use mucinex dm to help with cough   Plan  Hold coumadin today  Chest xray and labs today  Mucinex DM Twice daily  As needed  Cough /congestion  Prednisone 10 mg take  4 each am x 2 days,   2 each am x 2 days,  1 each am x 2 days and stop   Follow up in 2 weeks with Dr. Melvyn Novas  And As needed   Please contact office for sooner follow up if symptoms do not improve or worsen or seek emergency care

## 2013-12-02 NOTE — Telephone Encounter (Signed)
Per Dr. Arnoldo Morale change Vitamin D to 3000 units daily.

## 2013-12-02 NOTE — Telephone Encounter (Signed)
Yes please send refills x 3 of albuterol

## 2013-12-02 NOTE — Progress Notes (Signed)
Subjective:     Patient ID: [name], female   DOB: [birth year] MRN: [id number]  Brief patient profile:  6 yowf never smoker with bad gerd >  hx of esophageal cancer w/ prev. esophagectomy with gastric pull- through by [name] referred 01/07/2013 to pulmonary  by Dr Colin Benton for new cough x early 2014   HPI 01/07/2013 1st pulmonary eval in EMR era previously eval   remotely (pre EMR) by Joya Gaskins with prednisone responsive cough  Now with recurrent cough x 5 years usually better with abx but never resoved then about 5 months ago more persistent, daily,  and not better with abx assoc with recurrent dysphagia s/p stent which relieved the dysphagia. Cough is day = night, prod of mod abmt so mucoid sputum and intermittent  Blood up to a tbsp at a time.while maintained on coumadin for afib.. rec Prednisone 10 mg take  4 each am x 2 days,   2 each am x 2 days,  1 each am x 2 days and stop Hold coumadin when coughing up blood   01/15/2013 f/u ov/Wert re cough/hemoptysis/ still on coumadin Chief Complaint  Patient presents with  . Follow-up    Pt states her cough is unchnaged since the last visit. Still waking up at least twice in the night with hemoptysis.   no epistaxis, less than 2 tbsp of blood per day. Mild dyspnea with acitivities of daily living. >FOB 01/18/13 no bleeding identified, completely clear airways butcoughed up bilious mucus during procedure ? From stomach  01/25/2013 Acute OV / NP Complains of increased hemoptysis with BRB x2 days with increased SOB.  Noticed several episodes last 2 days with blood tinged mucus-bright red. Tsp at times. Mostly clear mucus w/ no prulent mucus .  reports hemoptysis begins as "straight blood" then turns to clear mucus . No bleeding today.  Pt complains over last 3 months has had blood tinged mucus with cough. Was referred to pulmonary 6/9. CT chest angio neg for PE  , Patchy ground-glass attenuation airspace disease in the  dependent distribution  of the bilateral lower lobes with associated  debris within segmental and subsegmental lower lobe bronchi.  Findings are most consistent chronic subclinical aspiration versus  bilateral bronchopneumonia. Chronic aspiration is favored. She was on chronic coumadin for atrial fib . This was held due to persistent hemoptysis. She underwent FOB on 6/20 w/ cytology neg for malignant cells. No source of bleeding found . Did have gerd event during procedure.  She has no  Fever, discolored mucus or edema. No syncope or palpitations no chest pain.  Today labs show nml hbg/platelets and INR at 1.1.  Mild elevated wbc at 12.9k , w/ mild left shift  Appetite is at her baseline w/ no vomiting.  No diarrhea. No urinary symptoms.  CXR w/ no acute changes rec Begin Augmentin 875mg  Twice daily  For 7 days  Mucinex DM Twice daily  As needed  Cough/congestion  Fluids and rest  If coughing of blood does not resolve or worsens will need ov or go to ER.  Remain off coumadin.  Avoid aspirin products hgb 13.2 > 13.7    01/28/2013 f/u ov/Wert  Chief Complaint  Patient presents with  . 3 day follow up    Hemoptysis has improved.  Did cough up a small amount of dry looking blood this morning.  Has SOb when walking [place] -- gradually worsening over the past 5 months.   no dysphagia, sleeps sitting up. Breathing  better p albuterol despite poor hfa/see a/p  >augmentin rx and dulera rx   02/25/2013 Follow up Asthma/Cough/Hemoptysis/Chronic Aspiration Patient returns for a one-month followup Patient was seen one month ago with persistent cough. She was finishing up and off been taper, which she reports her cough is substantially, improved. She's had no further hemoptysis and is now back on Coumadin therapy. INR last week. Was 3.1 She says, that she is feeling better. She continues to have, difficulty using her dulera inhaler. She has a tremor and it makes it difficult for her to use her inhaler. We discussed using  nebs instead.   She recently underwent an upper endoscopy with Dr. Deatra Ina. She was found to have a recurrent gastric outlet obstruction, secondary to stenosis at the very distal end of her duodenal stent. She underwent a successful placement of her duodenal stent beyond the stenotic area. This was placed on July 14.  Does complain that over the last 3 days, that she is noticed some lower extremity swelling, especially along her ankles, and feet. She denies any orthopnea, increased shortness, of breath, or fever. She is quite concerned about the swelling in her ankles. She has an appointment with her family care doctor in 2 days, but requests a prescription to help with the swelling. She is currently on hydrochlorothiazide 6.25 mg daily. Of note, this is a combination drug with her bisoprolol. rec Stop Dulera  Begin Budesonide and Brovana Neb Twice daily   Rinse mouth out after nebs.  Use Lasix 20mg  x 1 today .  I will call with lab results.    11/04/13 rx omnicef x 10 d fever, cough with yellow no better  11/11/13 levaquin x 10   11/12/2013 f/u ov/Wert re: started levaquin 11/11/13 "sick x 3-4 months"/ confused with meds/ niece has taken them over and uses pill box but no master list / not using laba/ics bid as maint as rec  Chief Complaint  Patient presents with  . Follow-up    Pt reports has had PNA x 2 since her last visit here in June 2014. She states that her breathing has been worse for the past 3-4 months. She also c/o increased cough- prod with moderate yellow sputum.   Denies overt hb/ reflux/aspiration. Not limited by breathing from desired activities  But very sedentary  >>brovana/pulmicort   12/02/2013  Follow up and Med review  Pt returns for follow up and med review .  Pt is here with family who help with her meds.  She did not bring pill bottles as she gets sheets of meds and puts them into pill box. We reviewed her med list.  She is taking Dulera and Pulmicort [place] Does  reports some increased head congestion/PND and prod cough with clear mucus mixed with BRB, wheezing, increased SOB - symptoms worsened x2 weeks.  denies f/c/s, tightness, nausea, vomiting. No fever, or discolored mucus  Is on Coumadin. Last INR 1.9. 1 week ago.  No frank hemoptysis, blood tinged mucus.  No chest pain or orthopnea.  Swelling is less than usual.      Current Medications, Allergies, Complete Past Medical History, Past Surgical History, Family History, and Social History were reviewed in [employer] record.  ROS  The following are not active complaints unless bolded sore throat, dysphagia, dental problems, itching, sneezing,  nasal congestion or excess/ purulent secretions, ear ache,   fever, chills, sweats, unintended wt loss, pleuritic or exertional cp, hemoptysis,  orthopnea pnd or leg swelling,  presyncope, palpitations, heartburn, abdominal pain, anorexia, nausea, vomiting, diarrhea  or change in bowel or urinary habits, change in stools or urine, dysuria,hematuria,  rash, arthralgias, visual complaints, headache, numbness weakness or ataxia or problems with walking or coordination,  change in mood/affect or memory.               Objective:   Physical Exam   01/15/2013      119  >>119 01/25/2013 > 01/28/2013 120 >119 02/25/2013 > 11/12/2013  116 >112 12/02/2013   HEENT: nl dentition, turbinates, and orophanx. Nl external ear canals without cough reflex   NECK :  without JVD/Nodes/TM/ nl carotid upstrokes bilaterally   LUNGS: no acc muscle use, diminshed BS in bases    CV:  RRR  no s3 or murmur or increase in P2, tr-1+ pitting edema bilaterally. No redness or calf tenderness.  Neg homans sign . Venous insufficiency changes   ABD:  soft and nontender with nl excursion in the supine position. No bruits or organomegaly, bowel sounds nl  MS:  warm without deformities, calf tenderness, cyanosis or clubbing  SKIN: warm and dry without lesions          11/04/13 1. Stable cardiomegaly. No CHF.  2. Left lower lobe atelectasis versus infiltrate.  3. Thoracoabdominal aortic stent graft.

## 2013-12-02 NOTE — Telephone Encounter (Signed)
Called and spoke with [name] and she is aware that refills of the albuterol has been sent to the pharmacy.  Nothing further is needed.

## 2013-12-02 NOTE — Telephone Encounter (Signed)
Rx for Ambien sent to [facility].

## 2013-12-02 NOTE — Patient Instructions (Addendum)
Hold coumadin today  Chest xray and labs today  Mucinex DM Twice daily  As needed  Cough /congestion  Prednisone 10 mg take  4 each am x 2 days,   2 each am x 2 days,  1 each am x 2 days and stop Follow med list as discussed  Stop Dulera .  Continue on Brovana and Budesonide "Neb" Twice daily   Use Albuterol Inhaler 2 every 4hrs as needed -RESCUE INHALER ONLY   Follow up in 2 weeks with Dr. Melvyn Novas  And As needed   Please contact office for sooner follow up if symptoms do not improve or worsen or seek emergency care

## 2013-12-02 NOTE — Assessment & Plan Note (Signed)
Flare   Plan  Hold coumadin today  Chest xray and labs today  Mucinex DM Twice daily  As needed  Cough /congestion  Prednisone 10 mg take  4 each am x 2 days,   2 each am x 2 days,  1 each am x 2 days and stop Follow med list as discussed  Stop Dulera .  Continue on Brovana and Budesonide "Neb" Twice daily   Use Albuterol Inhaler 2 every 4hrs as needed -RESCUE INHALER ONLY   Follow up in 2 weeks with Dr. Melvyn Novas  And As needed   Please contact office for sooner follow up if symptoms do not improve or worsen or seek emergency care

## 2013-12-03 NOTE — Telephone Encounter (Signed)
Called and spoke with pt and pt is aware of Dr. Arnoldo Morale' recommendations.

## 2013-12-04 ENCOUNTER — Ambulatory Visit (INDEPENDENT_AMBULATORY_CARE_PROVIDER_SITE_OTHER): Payer: Medicare Other | Admitting: General Practice

## 2013-12-04 ENCOUNTER — Telehealth: Payer: Self-pay | Admitting: Internal Medicine

## 2013-12-04 DIAGNOSIS — Z5181 Encounter for therapeutic drug level monitoring: Secondary | ICD-10-CM

## 2013-12-04 DIAGNOSIS — I4891 Unspecified atrial fibrillation: Secondary | ICD-10-CM

## 2013-12-04 MED ORDER — AMOXICILLIN-POT CLAVULANATE 875-125 MG PO TABS: 1.0000 | ORAL_TABLET | Freq: Two times a day (BID) | ORAL | Status: DC

## 2013-12-04 NOTE — Telephone Encounter (Signed)
INR 5/4 2.1  Begin Augmentin 875mg  Twice daily  For 7 days #14 , no refills  Finish Steroid taper  Hold coumadin for 2 days  Mucinex DM Twice daily As needed Cough /congestion  Needs ov with Dr. Melvyn Novas  In 1 week and As needed   Please contact office for sooner follow up if symptoms do not improve or worsen or seek emergency care  If bleeding does not stop or worsens needs sooner follow up call office or ER.

## 2013-12-04 NOTE — Telephone Encounter (Signed)
Spoke with patient-she is aware of recs and rx sent to pharmacy. Also patient scheduled with MW on 12/10/13 at 1:45pm.

## 2013-12-04 NOTE — Telephone Encounter (Signed)
Spoke with pt.  Pt seen TP on 12/02/13 for Hemoptysis.  Pt denies having any more hemoptysis until last night at 11:00 and this morning around at 5:00.  Totaled about 1 tsp and was mixed with sputum.  Pt states that TP told her to call if this happened again.  Pt denies worseing in any other symptoms.  Please advise.

## 2013-12-04 NOTE — Progress Notes (Signed)
Pre visit review using our clinic review tool, if applicable. No additional management support is needed unless otherwise documented below in the visit note. 

## 2013-12-09 ENCOUNTER — Ambulatory Visit: Payer: Medicare Other | Admitting: Family

## 2013-12-09 ENCOUNTER — Ambulatory Visit: Payer: Medicare Other

## 2013-12-10 ENCOUNTER — Encounter: Payer: Self-pay | Admitting: Internal Medicine

## 2013-12-10 ENCOUNTER — Ambulatory Visit (INDEPENDENT_AMBULATORY_CARE_PROVIDER_SITE_OTHER): Payer: Medicare Other | Admitting: Internal Medicine

## 2013-12-10 ENCOUNTER — Telehealth: Payer: Self-pay | Admitting: Internal Medicine

## 2013-12-10 VITALS — BP 102/70 | HR 86 | Temp 98.1°F | Ht 60.0 in | Wt 114.0 lb

## 2013-12-10 DIAGNOSIS — R042 Hemoptysis: Secondary | ICD-10-CM

## 2013-12-10 DIAGNOSIS — J45909 Unspecified asthma, uncomplicated: Secondary | ICD-10-CM

## 2013-12-10 MED ORDER — ALBUTEROL SULFATE (2.5 MG/3ML) 0.083% IN NEBU: 2.5000 mg | INHALATION_SOLUTION | RESPIRATORY_TRACT | Status: DC | PRN

## 2013-12-10 MED ORDER — HYDROCOD POLST-CHLORPHEN POLST 10-8 MG/5ML PO LQCR
5.0000 mL | Freq: Two times a day (BID) | ORAL | Status: DC | PRN
Start: 2013-12-10 — End: 2014-01-27

## 2013-12-10 MED ORDER — VITAMIN D (ERGOCALCIFEROL) 1.25 MG (50000 UNIT) PO CAPS: 50000.0000 [IU] | ORAL_CAPSULE | ORAL | Status: DC

## 2013-12-10 NOTE — Telephone Encounter (Signed)
opn 11/29/2013 pt was called and told that per Dr. Arnoldo Morale- pt needs to take vitamin d 3000 units.  Left a message for return call.

## 2013-12-10 NOTE — Telephone Encounter (Signed)
Pt need a rx on Vitamin D, Ergocalciferol, (DRISDOL) 74 UNITS CAPS   Pt  Niece Precious Bard  said that Dr Melvyn Novas changed her coumadin would like a call back

## 2013-12-10 NOTE — Telephone Encounter (Signed)
I called and spoke with Patti. Made her aware of MW recs.  She wants to know how often then should pt get her coumadin levels checked when she does not take the coumadin? Please advise MW thanks

## 2013-12-10 NOTE — Patient Instructions (Addendum)
For cough  mucinex dm up to 1200 mg every 12 hours as needed with flutter valve as much as possible and supplement with tussionex if needed  For breathing  Plan A= automatic = brovana/budesonide twice daily perfectly regularly   Plan B = ventolin hfa Only use your albuterol as a rescue medication to be used if you can't catch your breath by resting or doing a relaxed purse lip breathing pattern.  - The less you use it, the better it will work when you need it. - Ok to use up to 2 puffs  every 4 hours if you must but call for immediate appointment if use goes up over your usual need - Don't leave home without it !!  (think of it like the spare tire for your car)   Plan C = backup or Plan B = albuterol neb up to every 4 hours if needed   Hold coumadin anytime you have excess bleeding   Please schedule a follow up visit in 3 months but call sooner if needed

## 2013-12-10 NOTE — Telephone Encounter (Signed)
If not taking at all don't take at all. If restarts for over a week s any bleeding then definitely would need it checked

## 2013-12-10 NOTE — Progress Notes (Signed)
Subjective:     Patient ID: [name], female   DOB: Feb 24, 1929 MRN: [id number]  Brief patient profile:  73 yowf never smoker with bad gerd >  hx of esophageal cancer w/ prev. esophagectomy with gastric pull- through by [name] referred 01/07/2013 to pulmonary  by Dr Colin Benton for new cough x early 2014   HPI 01/07/2013 1st pulmonary eval in EMR era previously eval   remotely (pre EMR) by Joya Gaskins with prednisone responsive cough  Now with recurrent cough x 5 years usually better with abx but never resoved then about 5 months prior to OV   more persistent, daily,  and not better with abx assoc with recurrent dysphagia s/p stent which relieved the dysphagia. Cough is day = night, prod of mod abmt so mucoid sputum and intermittent  Blood up to a tbsp at a time.while maintained on coumadin for afib.. rec Prednisone 10 mg take  4 each am x 2 days,   2 each am x 2 days,  1 each am x 2 days and stop Hold coumadin when coughing up blood   01/15/2013 f/u ov/Wert re cough/hemoptysis/ still on coumadin Chief Complaint  Patient presents with  . Follow-up    Pt states her cough is unchnaged since the last visit. Still waking up at least twice in the night with hemoptysis.   no epistaxis, less than 2 tbsp of blood per day. Mild dyspnea with acitivities of daily living. >FOB 01/18/13 no bleeding identified, completely clear airways but coughed up bilious mucus during procedure ? From stomach  01/25/2013 Acute OV / NP Complains of increased hemoptysis with BRB x2 days with increased SOB.  Noticed several episodes last 2 days with blood tinged mucus-bright red. Tsp at times. Mostly clear mucus w/ no prulent mucus .  reports hemoptysis begins as "straight blood" then turns to clear mucus . No bleeding today.  Pt complains over last 3 months has had blood tinged mucus with cough. Was referred to pulmonary 6/9. CT chest angio neg for PE  , Patchy ground-glass attenuation airspace disease in the  dependent  distribution of the bilateral lower lobes with associated  debris within segmental and subsegmental lower lobe bronchi.  Findings are most consistent chronic subclinical aspiration versus  bilateral bronchopneumonia. Chronic aspiration is favored. She was on chronic coumadin for atrial fib . This was held due to persistent hemoptysis. She underwent FOB on 6/20 w/ cytology neg for malignant cells. No source of bleeding found . Did have gerd event during procedure.  She has no  Fever, discolored mucus or edema. No syncope or palpitations no chest pain.  Today labs show nml hbg/platelets and INR at 1.1.  Mild elevated wbc at 12.9k , w/ mild left shift  Appetite is at her baseline w/ no vomiting.  No diarrhea. No urinary symptoms.  CXR w/ no acute changes rec Begin Augmentin 875mg  Twice daily  For 7 days  Mucinex DM Twice daily  As needed  Cough/congestion  Fluids and rest  If coughing of blood does not resolve or worsens will need ov or go to ER.  Remain off coumadin.  Avoid aspirin products hgb 13.2 > 13.7    01/28/2013 f/u ov/Wert  Chief Complaint  Patient presents with  . 3 day follow up    Hemoptysis has improved.  Did cough up a small amount of dry looking blood this morning.  Has SOb when walking [place] -- gradually worsening over the past 5 months.   no  dysphagia, sleeps sitting up. Breathing better p albuterol despite poor hfa/see a/p  >augmentin rx and dulera rx   02/25/2013 Follow up Asthma/Cough/Hemoptysis/Chronic Aspiration Patient returns for a one-month followup Patient was seen one month ago with persistent cough. She was finishing up and off been taper, which she reports her cough is substantially, improved. She's had no further hemoptysis and is now back on Coumadin therapy. INR last week. Was 3.1 She says, that she is feeling better. She continues to have, difficulty using her dulera inhaler. She has a tremor and it makes it difficult for her to use her inhaler. We  discussed using nebs instead.   She recently underwent an upper endoscopy with Dr. Deatra Ina. She was found to have a recurrent gastric outlet obstruction, secondary to stenosis at the very distal end of her duodenal stent. She underwent a successful placement of her duodenal stent beyond the stenotic area. This was placed on July 14.  Does complain that over the last 3 days, that she is noticed some lower extremity swelling, especially along her ankles, and feet. She denies any orthopnea, increased shortness, of breath, or fever. She is quite concerned about the swelling in her ankles. She has an appointment with her family care doctor in 2 days, but requests a prescription to help with the swelling. She is currently on hydrochlorothiazide 6.25 mg daily. Of note, this is a combination drug with her bisoprolol. rec Stop Dulera  Begin Budesonide and Brovana Neb Twice daily   Rinse mouth out after nebs.  Use Lasix 20mg  x 1 today .  I will call with lab results.    11/04/13 rx omnicef x 10 d fever, cough with yellow no better  11/11/13 levaquin x 10   11/12/2013 f/u ov/Wert re: started levaquin 11/11/13 "sick x 3-4 months"/ confused with meds/ niece has taken them over and uses pill box but no master list / not using laba/ics bid as maint as rec  Chief Complaint  Patient presents with  . Follow-up    Pt reports has had PNA x 2 since her last visit here in June 2014. She states that her breathing has been worse for the past 3-4 months. She also c/o increased cough- prod with moderate yellow sputum.   Denies overt hb/ reflux/aspiration. Not limited by breathing from desired activities  But very sedentary  >>brovana/pulmicort   12/02/2013  Follow up and Med review  Pt returns for follow up and med review .  Pt is here with family who help with her meds.  She did not bring pill bottles as she gets sheets of meds and puts them into pill box. We reviewed her med list.  She is taking Dulera and Pulmicort  [place] Does reports some increased head congestion/PND and prod cough with clear mucus mixed with BRB, wheezing, increased SOB - symptoms worsened x2 weeks.  denies f/c/s, tightness, nausea, vomiting. No fever, or discolored mucus  Is on Coumadin. Last INR 1.9. 1 week ago.  No frank hemoptysis, blood tinged mucus.  No chest pain or orthopnea.  Swelling is less than usual.  rec Hold coumadin today  Chest xray and labs today  Mucinex DM Twice daily  As needed  Cough /congestion  Prednisone 10 mg take  4 each am x 2 days,   2 each am x 2 days,  1 each am x 2 days and stop Follow med list as discussed  Stop Dulera .  Continue on Brovana and Budesonide "Neb" Twice daily  12/10/2013 f/u ov/Wert re:  Chief Complaint  Patient presents with  . Follow-up    Pt states cough is worse "now I have an asthma cough".  Still still has some hemoptysis. She is using ventolin  3-4 x per day.     Hemoptysis is streaky and < 1 -2 tsp at most on bad days. Sob better p ventolin  No obvious day to day or daytime variabilty or assoc    cp or chest tightness, subjective wheeze overt sinus or hb symptoms. No unusual exp hx or h/o childhood pna/ asthma or knowledge of premature birth.  Sleeping ok without nocturnal  or early am exacerbation  of respiratory  c/o's or need for noct saba. Also denies any obvious fluctuation of symptoms with weather or environmental changes or other aggravating or alleviating factors except as outlined above   Current Medications, Allergies, Complete Past Medical History, Past Surgical History, Family History, and Social History were reviewed in [employer] record.  ROS  The following are not active complaints unless bolded sore throat, dysphagia, dental problems, itching, sneezing,  nasal congestion or excess/ purulent secretions, ear ache,   fever, chills, sweats, unintended wt loss, pleuritic or exertional cp, hemoptysis,  orthopnea pnd or leg swelling,  presyncope, palpitations, heartburn, abdominal pain, anorexia, nausea, vomiting, diarrhea  or change in bowel or urinary habits, change in stools or urine, dysuria,hematuria,  rash, arthralgias, visual complaints, headache, numbness weakness or ataxia or problems with walking or coordination,  change in mood/affect or memory.     .               Objective:   Physical Exam   01/15/2013      119  >>119 01/25/2013 > 01/28/2013 120 >119 02/25/2013 > 11/12/2013  116 >112 12/02/2013 > 12/10/2013 114   HEENT: nl dentition, turbinates, and orophanx. Nl external ear canals without cough reflex   NECK :  without JVD/Nodes/TM/ nl carotid upstrokes bilaterally   LUNGS: no acc muscle use, diminshed BS in bases, a few insp/exp rhonchi bilaterally     CV:  RRR  no s3 or murmur or increase in P2, tr-1+ pitting edema bilaterally. No redness or calf tenderness.  Neg homans sign . Venous insufficiency changes   ABD:  soft and nontender with nl excursion in the supine position. No bruits or organomegaly, bowel sounds nl  MS:  warm without deformities, calf tenderness, cyanosis or clubbing  SKIN: warm and dry without lesions         11/04/13 1. Stable cardiomegaly. No CHF.  2. Left lower lobe atelectasis versus infiltrate.  3. Thoracoabdominal aortic stent graft.

## 2013-12-10 NOTE — Telephone Encounter (Signed)
Pt's niece wants to know if pt should stop taking Coumadin since she is having bloody emesis, especially at night.  Pt saw Dr. Melvyn Novas today and notes say "Hold Coumadin if you have excess bleeding" and pt's niece is concerned because in her opinion, she is having excess bleeding daily.  She needs more clarification as to when pt is to take it and when she's not to take it.  She requests a call back.

## 2013-12-10 NOTE — Telephone Encounter (Signed)
Called spoke with Pattie. Made aware of MW recs. She voiced understanding and needed nothing further

## 2013-12-10 NOTE — Telephone Encounter (Signed)
Left vm for pt's niece, Precious Bard to return call.

## 2013-12-10 NOTE — Telephone Encounter (Signed)
More than a speck or two is probably too much and would hold the coumadin whenever she's the least bit concerned about it (ie err on the side of not taking rather than taking)

## 2013-12-11 ENCOUNTER — Telehealth: Payer: Self-pay

## 2013-12-11 NOTE — Telephone Encounter (Signed)
Pt's daughter also wanted to make Dr. Arnoldo Morale aware that pt is having issues with her coumadin and wakes up every morning with blood in her mouth.

## 2013-12-11 NOTE — Telephone Encounter (Signed)
Spoke with her niece yesterday.  Waiting on response from Dr. Melvyn Novas.

## 2013-12-11 NOTE — Telephone Encounter (Signed)
Pt would like to transfer care to [facility].  Pls advise.

## 2013-12-11 NOTE — Telephone Encounter (Signed)
Advised pt's niece about vitamin d recommendation from Dr. Arnoldo Morale from 11/29/13 note.  Pt's niece states that pt already has to take too many daily medications and she would like to know if there is any other option? She is aware that Dr. Arnoldo Morale is out of the office until Monday.

## 2013-12-11 NOTE — Telephone Encounter (Signed)
Ok with me 

## 2013-12-12 ENCOUNTER — Telehealth: Payer: Self-pay | Admitting: Family Medicine

## 2013-12-12 NOTE — Assessment & Plan Note (Signed)
-   fob 01/18/13 neg for source of blood, bilious material "expectorated" during procedure though doubt came from lung > afb smear and culture neg  Discussed in detail all the  indications, usual  risks and alternatives  relative to the benefits with patient who agrees to proceed with hold the coumadin whenever notes more than very small amts of blood in sputum (this is a judgement call because Afib puts her at risk of future cva's)

## 2013-12-12 NOTE — Telephone Encounter (Signed)
Pt has been sch

## 2013-12-12 NOTE — Telephone Encounter (Signed)
Pt's niece, Precious Bard, left vm with [facility] at [facility] stating that she has questions regarding pt's Coumadin.  Left vm for her to return call.

## 2013-12-12 NOTE — Assessment & Plan Note (Signed)
DDX of  difficult airways managment all start with A and  include Adherence, Ace Inhibitors, Acid Reflux, Active Sinus Disease, Alpha 1 Antitripsin deficiency, Anxiety masquerading as Airways dz,  ABPA,  allergy(esp in young), Aspiration (esp in elderly), Adverse effects of DPI,  Active smokers, plus two Bs  = Bronchiectasis and Beta blocker use..and one C= CHF  Adherence is always the initial "prime suspect" and is a multilayered concern that requires a "trust but verify" approach in every patient - starting with knowing how to use medications, especially inhalers, correctly, keeping up with refills and understanding the fundamental difference between maintenance and prns vs those medications only taken for a very short course and then stopped and not refilled.  - seems to be doing better with meds but declined med calendar that would have provided action plan for prns - The proper method of use, as well as anticipated side effects, of a metered-dose inhaler are discussed and demonstrated to the patient. Improved effectiveness after extensive coaching during this visit to a level of approximately  50% from baseline nearly 0 so needs to use alb neb to back up hfa when not effective  ? Aspiration/ chronic/ recurrent > def at risk, already being followed by Deatra Ina, nothing else to offer here, hold abx for def signs of infection to reduce risk of c diff or resistant pulmonary pathogens

## 2013-12-16 ENCOUNTER — Ambulatory Visit: Payer: Medicare Other | Attending: Family | Admitting: Physical Therapy

## 2013-12-16 DIAGNOSIS — M542 Cervicalgia: Secondary | ICD-10-CM | POA: Insufficient documentation

## 2013-12-16 DIAGNOSIS — R5381 Other malaise: Secondary | ICD-10-CM | POA: Insufficient documentation

## 2013-12-16 DIAGNOSIS — R269 Unspecified abnormalities of gait and mobility: Secondary | ICD-10-CM | POA: Insufficient documentation

## 2013-12-16 DIAGNOSIS — IMO0001 Reserved for inherently not codable concepts without codable children: Secondary | ICD-10-CM | POA: Insufficient documentation

## 2013-12-18 ENCOUNTER — Ambulatory Visit: Payer: Medicare Other | Admitting: Internal Medicine

## 2013-12-18 ENCOUNTER — Ambulatory Visit: Payer: Medicare Other | Admitting: Physical Therapy

## 2013-12-19 ENCOUNTER — Telehealth: Payer: Self-pay | Admitting: Internal Medicine

## 2013-12-19 NOTE — Telephone Encounter (Signed)
Spoke with Precious Bard, pt's Niece  She states that despite stopping warfarin over a wk ago, pt is waking up in the night "with mouth full of blood" and bitter taste in her mouth  OV with MW for tomorrow at 11:45  Bring all meds

## 2013-12-20 ENCOUNTER — Encounter: Payer: Self-pay | Admitting: Internal Medicine

## 2013-12-20 ENCOUNTER — Other Ambulatory Visit: Payer: Self-pay | Admitting: Internal Medicine

## 2013-12-20 ENCOUNTER — Ambulatory Visit (INDEPENDENT_AMBULATORY_CARE_PROVIDER_SITE_OTHER)
Admission: RE | Admit: 2013-12-20 | Discharge: 2013-12-20 | Disposition: A | Discharge status: Home / Self Care | Payer: Medicare Other | Source: Ambulatory Visit | Attending: Internal Medicine | Admitting: Internal Medicine

## 2013-12-20 ENCOUNTER — Ambulatory Visit (INDEPENDENT_AMBULATORY_CARE_PROVIDER_SITE_OTHER): Payer: Medicare Other | Admitting: Internal Medicine

## 2013-12-20 VITALS — BP 112/60 | HR 90 | Ht 60.0 in | Wt 114.0 lb

## 2013-12-20 DIAGNOSIS — J690 Pneumonitis due to inhalation of food and vomit: Secondary | ICD-10-CM

## 2013-12-20 DIAGNOSIS — J45909 Unspecified asthma, uncomplicated: Secondary | ICD-10-CM

## 2013-12-20 DIAGNOSIS — R042 Hemoptysis: Secondary | ICD-10-CM

## 2013-12-20 NOTE — Patient Instructions (Addendum)
No change in your medications  Please remember to go to the  x-ray department downstairs for your tests - we will call you with the results when they are available.     Please schedule a follow up visit in 3 months but call sooner if needed

## 2013-12-20 NOTE — Progress Notes (Signed)
Quick Note:  Spoke with pt and notified of results per Dr. Wert. Pt verbalized understanding and denied any questions.  ______ 

## 2013-12-20 NOTE — Progress Notes (Signed)
Subjective:     Patient ID: [name], female   DOB: Feb 24, 1929 MRN: [id number]  Brief patient profile:  73 yowf never smoker with bad gerd >  hx of esophageal cancer w/ prev. esophagectomy with gastric pull- through by [name] referred 01/07/2013 to pulmonary  by Dr Colin Benton for new cough x early 2014   HPI 01/07/2013 1st pulmonary eval in EMR era previously eval   remotely (pre EMR) by Joya Gaskins with prednisone responsive cough  Now with recurrent cough x 5 years usually better with abx but never resoved then about 5 months prior to OV   more persistent, daily,  and not better with abx assoc with recurrent dysphagia s/p stent which relieved the dysphagia. Cough is day = night, prod of mod abmt so mucoid sputum and intermittent  Blood up to a tbsp at a time.while maintained on coumadin for afib.. rec Prednisone 10 mg take  4 each am x 2 days,   2 each am x 2 days,  1 each am x 2 days and stop Hold coumadin when coughing up blood   01/15/2013 f/u ov/Niaja Stickley re cough/hemoptysis/ still on coumadin Chief Complaint  Patient presents with  . Follow-up    Pt states her cough is unchnaged since the last visit. Still waking up at least twice in the night with hemoptysis.   no epistaxis, less than 2 tbsp of blood per day. Mild dyspnea with acitivities of daily living. >FOB 01/18/13 no bleeding identified, completely clear airways but coughed up bilious mucus during procedure ? From stomach  01/25/2013 Acute OV / NP Complains of increased hemoptysis with BRB x2 days with increased SOB.  Noticed several episodes last 2 days with blood tinged mucus-bright red. Tsp at times. Mostly clear mucus w/ no prulent mucus .  reports hemoptysis begins as "straight blood" then turns to clear mucus . No bleeding today.  Pt complains over last 3 months has had blood tinged mucus with cough. Was referred to pulmonary 6/9. CT chest angio neg for PE  , Patchy ground-glass attenuation airspace disease in the  dependent  distribution of the bilateral lower lobes with associated  debris within segmental and subsegmental lower lobe bronchi.  Findings are most consistent chronic subclinical aspiration versus  bilateral bronchopneumonia. Chronic aspiration is favored. She was on chronic coumadin for atrial fib . This was held due to persistent hemoptysis. She underwent FOB on 6/20 w/ cytology neg for malignant cells. No source of bleeding found . Did have gerd event during procedure.  She has no  Fever, discolored mucus or edema. No syncope or palpitations no chest pain.  Today labs show nml hbg/platelets and INR at 1.1.  Mild elevated wbc at 12.9k , w/ mild left shift  Appetite is at her baseline w/ no vomiting.  No diarrhea. No urinary symptoms.  CXR w/ no acute changes rec Begin Augmentin 875mg  Twice daily  For 7 days  Mucinex DM Twice daily  As needed  Cough/congestion  Fluids and rest  If coughing of blood does not resolve or worsens will need ov or go to ER.  Remain off coumadin.  Avoid aspirin products hgb 13.2 > 13.7    01/28/2013 f/u ov/[name]  Chief Complaint  Patient presents with  . 3 day follow up    Hemoptysis has improved.  Did cough up a small amount of dry looking blood this morning.  Has SOb when walking [place] -- gradually worsening over the past 5 months.   no  dysphagia, sleeps sitting up. Breathing better p albuterol despite poor hfa/see a/p  >augmentin rx and dulera rx   02/25/2013 Follow up Asthma/Cough/Hemoptysis/Chronic Aspiration Patient returns for a one-month followup Patient was seen one month ago with persistent cough. She was finishing up and off been taper, which she reports her cough is substantially, improved. She's had no further hemoptysis and is now back on Coumadin therapy. INR last week. Was 3.1 She says, that she is feeling better. She continues to have, difficulty using her dulera inhaler. She has a tremor and it makes it difficult for her to use her inhaler. We  discussed using nebs instead.   She recently underwent an upper endoscopy with Dr. Deatra Ina. She was found to have a recurrent gastric outlet obstruction, secondary to stenosis at the very distal end of her duodenal stent. She underwent a successful placement of her duodenal stent beyond the stenotic area. This was placed on July 14.  Does complain that over the last 3 days, that she is noticed some lower extremity swelling, especially along her ankles, and feet. She denies any orthopnea, increased shortness, of breath, or fever. She is quite concerned about the swelling in her ankles. She has an appointment with her family care doctor in 2 days, but requests a prescription to help with the swelling. She is currently on hydrochlorothiazide 6.25 mg daily. Of note, this is a combination drug with her bisoprolol. rec Stop Dulera  Begin Budesonide and Brovana Neb Twice daily   Rinse mouth out after nebs.  Use Lasix 20mg  x 1 today .  I will call with lab results.    11/04/13 rx omnicef x 10 d fever, cough with yellow no better  11/11/13 levaquin x 10   11/12/2013 f/u ov/Christohper Dube re: started levaquin 11/11/13 "sick x 3-4 months"/ confused with meds/ niece has taken them over and uses pill box but no master list / not using laba/ics bid as maint as rec  Chief Complaint  Patient presents with  . Follow-up    Pt reports has had PNA x 2 since her last visit here in June 2014. She states that her breathing has been worse for the past 3-4 months. She also c/o increased cough- prod with moderate yellow sputum.   Denies overt hb/ reflux/aspiration. Not limited by breathing from desired activities  But very sedentary  >>brovana/pulmicort   12/02/2013  Follow up and Med review  Pt returns for follow up and med review .  Pt is here with family who help with her meds.  She did not bring pill bottles as she gets sheets of meds and puts them into pill box. We reviewed her med list.  She is taking Dulera and Pulmicort  [place] Does reports some increased head congestion/PND and prod cough with clear mucus mixed with BRB, wheezing, increased SOB - symptoms worsened x2 weeks.  denies f/c/s, tightness, nausea, vomiting. No fever, or discolored mucus  Is on Coumadin. Last INR 1.9. 1 week ago.  No frank hemoptysis, blood tinged mucus.  No chest pain or orthopnea.  Swelling is less than usual.  rec Hold coumadin today  Chest xray and labs today  Mucinex DM Twice daily  As needed  Cough /congestion  Prednisone 10 mg take  4 each am x 2 days,   2 each am x 2 days,  1 each am x 2 days and stop Follow med list as discussed  Stop Dulera .  Continue on Brovana and Budesonide "Neb" Twice daily  12/10/2013 f/u ov/Kristoffer Bala re:  Chief Complaint  Patient presents with  . Follow-up    Pt states cough is worse "now I have an asthma cough".  Still still has some hemoptysis. She is using ventolin  3-4 x per day.     Hemoptysis is streaky and < 1 -2 tsp at most on bad days. Sob better p ventolin rec dFor cough  mucinex dm up to 1200 mg every 12 hours as needed with flutter valve as much as possible and supplement with tussionex if needed For breathing  Plan A= automatic = brovana/budesonide twice daily perfectly regularly  Plan B = ventolin hfa Only use your albuterol as a rescue medication   Plan C = backup or Plan B = albuterol neb up to every 4 hours if needed  Hold coumadin anytime you have excess bleeding > stopped 12/11/13    12/20/2013 f/u ov/Cayleb Jarnigan re: stopped coumadin completely / last saba hfa one day prior to [place] Complaint  Patient presents with  . Acute Visit    Pt c/o hemoptysis X4 days, coughing up blood about the size of a quarter.   no 02, no sob.  No purulent sputum, still freq expectorates obvious stomach contents  Sleeps in recliner x 7 years    No obvious day to day or daytime variabilty or assoc cp or chest tightness, subjective wheeze overt sinus or hb symptoms. No unusual exp hx or h/o  childhood pna/ asthma or knowledge of premature birth.  Sleeping ok without nocturnal  or early am exacerbation  of respiratory  c/o's or need for noct saba. Also denies any obvious fluctuation of symptoms with weather or environmental changes or other aggravating or alleviating factors except as outlined above   Current Medications, Allergies, Complete Past Medical History, Past Surgical History, Family History, and Social History were reviewed in [employer] record.  ROS  The following are not active complaints unless bolded sore throat, dysphagia, dental problems, itching, sneezing,  nasal congestion or excess/ purulent secretions, ear ache,   fever, chills, sweats, unintended wt loss, pleuritic or exertional cp, hemoptysis,  orthopnea pnd or leg swelling, presyncope, palpitations, heartburn, abdominal pain, anorexia, nausea, vomiting, diarrhea  or change in bowel or urinary habits, change in stools or urine, dysuria,hematuria,  rash, arthralgias, visual complaints, headache, numbness weakness or ataxia or problems with walking or coordination,  change in mood/affect or memory.     .               Objective:   Physical Exam   01/15/2013      119  >>119 01/25/2013 > 01/28/2013 120 >119 02/25/2013 > 11/12/2013  116 >112 12/02/2013 > 12/10/2013 114 > 12/20/2013 114   HEENT: nl dentition, turbinates, and orophanx. Nl external ear canals without cough reflex   NECK :  without JVD/Nodes/TM/ nl carotid upstrokes bilaterally   LUNGS: no acc muscle use, diminshed BS in bases, a few insp/exp rhonchi bilaterally     CV:  RRR  no s3 or murmur or increase in P2, tr-1+ pitting edema bilaterally. No redness or calf tenderness.  Neg homans sign . Venous insufficiency changes   ABD:  soft and nontender with nl excursion in the supine position. No bruits or organomegaly, bowel sounds nl  MS:  warm without deformities, calf tenderness, cyanosis or clubbing  SKIN: warm and dry  without lesions         CXR  12/20/2013 :  1. Stable chest  with severe cardiomegaly. No CHF. No focal infiltrate. 2. There is a gastric pull through with stent present. This appears stable.

## 2013-12-21 NOTE — Assessment & Plan Note (Signed)
High risk recurrence but no purulent sputum or acute changes on cxr so ok to leave off abx for now

## 2013-12-21 NOTE — Assessment & Plan Note (Signed)
-   fob 01/18/13 neg for source of blood, bilious material "expectorated" during procedure though doubt came from lung > afb smear and culture neg - stopped coumadin completely 12/11/13  Discussed in detail all the  indications, usual  risks and alternatives  relative to the benefits with patient who agrees to proceed with leaving off coumadin indefinitely given tendency to spont hemoptytis s def cause

## 2013-12-21 NOTE — Assessment & Plan Note (Addendum)
12/10/2013 p extensive coaching HFA effectiveness =    50% from a baseline of zero so rx alb neb q4h prn   Adequate control on present rx, reviewed > no change in rx needed  = perforomist/brovana bid and prn saba neb

## 2013-12-24 ENCOUNTER — Ambulatory Visit: Payer: Medicare Other | Admitting: Physical Therapy

## 2013-12-26 ENCOUNTER — Ambulatory Visit: Payer: Medicare Other | Admitting: Physical Therapy

## 2013-12-30 ENCOUNTER — Other Ambulatory Visit: Payer: Self-pay

## 2013-12-30 MED ORDER — SUCRALFATE 1 GM/10ML PO SUSP: ORAL | Status: DC

## 2013-12-31 ENCOUNTER — Ambulatory Visit: Payer: Medicare Other | Attending: Family | Admitting: Physical Therapy

## 2013-12-31 DIAGNOSIS — IMO0001 Reserved for inherently not codable concepts without codable children: Secondary | ICD-10-CM | POA: Insufficient documentation

## 2013-12-31 DIAGNOSIS — M542 Cervicalgia: Secondary | ICD-10-CM | POA: Insufficient documentation

## 2013-12-31 DIAGNOSIS — R269 Unspecified abnormalities of gait and mobility: Secondary | ICD-10-CM | POA: Insufficient documentation

## 2013-12-31 DIAGNOSIS — R5381 Other malaise: Secondary | ICD-10-CM | POA: Insufficient documentation

## 2014-01-03 ENCOUNTER — Ambulatory Visit: Payer: Medicare Other | Admitting: Physical Therapy

## 2014-01-06 ENCOUNTER — Emergency Department (HOSPITAL_COMMUNITY): Payer: Medicare Other

## 2014-01-06 ENCOUNTER — Encounter (HOSPITAL_COMMUNITY): Payer: Self-pay | Admitting: Emergency Medicine

## 2014-01-06 ENCOUNTER — Inpatient Hospital Stay (HOSPITAL_COMMUNITY)
Admission: EM | Admit: 2014-01-06 | Discharge: 2014-01-09 | DRG: 178 | Disposition: A | Discharge status: Rehabilitation Facility | Payer: Medicare Other | Attending: Internal Medicine | Admitting: Internal Medicine

## 2014-01-06 ENCOUNTER — Ambulatory Visit: Payer: Medicare Other | Admitting: Family

## 2014-01-06 ENCOUNTER — Telehealth: Payer: Self-pay | Admitting: Internal Medicine

## 2014-01-06 DIAGNOSIS — Z79890 Hormone replacement therapy (postmenopausal): Secondary | ICD-10-CM

## 2014-01-06 DIAGNOSIS — I359 Nonrheumatic aortic valve disorder, unspecified: Secondary | ICD-10-CM | POA: Diagnosis present

## 2014-01-06 DIAGNOSIS — D62 Acute posthemorrhagic anemia: Secondary | ICD-10-CM

## 2014-01-06 DIAGNOSIS — Z5181 Encounter for therapeutic drug level monitoring: Secondary | ICD-10-CM

## 2014-01-06 DIAGNOSIS — J189 Pneumonia, unspecified organism: Secondary | ICD-10-CM

## 2014-01-06 DIAGNOSIS — Z85828 Personal history of other malignant neoplasm of skin: Secondary | ICD-10-CM

## 2014-01-06 DIAGNOSIS — I4891 Unspecified atrial fibrillation: Secondary | ICD-10-CM

## 2014-01-06 DIAGNOSIS — Y92009 Unspecified place in unspecified non-institutional (private) residence as the place of occurrence of the external cause: Secondary | ICD-10-CM

## 2014-01-06 DIAGNOSIS — Z9889 Other specified postprocedural states: Secondary | ICD-10-CM

## 2014-01-06 DIAGNOSIS — Z7901 Long term (current) use of anticoagulants: Secondary | ICD-10-CM

## 2014-01-06 DIAGNOSIS — G4700 Insomnia, unspecified: Secondary | ICD-10-CM | POA: Diagnosis present

## 2014-01-06 DIAGNOSIS — J449 Chronic obstructive pulmonary disease, unspecified: Secondary | ICD-10-CM | POA: Diagnosis present

## 2014-01-06 DIAGNOSIS — J4489 Other specified chronic obstructive pulmonary disease: Secondary | ICD-10-CM | POA: Diagnosis present

## 2014-01-06 DIAGNOSIS — E860 Dehydration: Secondary | ICD-10-CM

## 2014-01-06 DIAGNOSIS — E43 Unspecified severe protein-calorie malnutrition: Secondary | ICD-10-CM | POA: Diagnosis present

## 2014-01-06 DIAGNOSIS — Z9849 Cataract extraction status, unspecified eye: Secondary | ICD-10-CM

## 2014-01-06 DIAGNOSIS — D509 Iron deficiency anemia, unspecified: Secondary | ICD-10-CM

## 2014-01-06 DIAGNOSIS — I509 Heart failure, unspecified: Secondary | ICD-10-CM | POA: Diagnosis present

## 2014-01-06 DIAGNOSIS — Z79899 Other long term (current) drug therapy: Secondary | ICD-10-CM

## 2014-01-06 DIAGNOSIS — J690 Pneumonitis due to inhalation of food and vomit: Principal | ICD-10-CM

## 2014-01-06 DIAGNOSIS — K219 Gastro-esophageal reflux disease without esophagitis: Secondary | ICD-10-CM

## 2014-01-06 DIAGNOSIS — Z66 Do not resuscitate: Secondary | ICD-10-CM | POA: Diagnosis present

## 2014-01-06 DIAGNOSIS — Z961 Presence of intraocular lens: Secondary | ICD-10-CM

## 2014-01-06 DIAGNOSIS — Z8501 Personal history of malignant neoplasm of esophagus: Secondary | ICD-10-CM

## 2014-01-06 DIAGNOSIS — W19XXXA Unspecified fall, initial encounter: Secondary | ICD-10-CM

## 2014-01-06 DIAGNOSIS — J45909 Unspecified asthma, uncomplicated: Secondary | ICD-10-CM

## 2014-01-06 DIAGNOSIS — R609 Edema, unspecified: Secondary | ICD-10-CM

## 2014-01-06 DIAGNOSIS — K920 Hematemesis: Secondary | ICD-10-CM

## 2014-01-06 DIAGNOSIS — K222 Esophageal obstruction: Secondary | ICD-10-CM

## 2014-01-06 DIAGNOSIS — R042 Hemoptysis: Secondary | ICD-10-CM

## 2014-01-06 DIAGNOSIS — A419 Sepsis, unspecified organism: Secondary | ICD-10-CM

## 2014-01-06 DIAGNOSIS — I5032 Chronic diastolic (congestive) heart failure: Secondary | ICD-10-CM

## 2014-01-06 DIAGNOSIS — I2789 Other specified pulmonary heart diseases: Secondary | ICD-10-CM | POA: Diagnosis present

## 2014-01-06 DIAGNOSIS — I10 Essential (primary) hypertension: Secondary | ICD-10-CM

## 2014-01-06 DIAGNOSIS — M810 Age-related osteoporosis without current pathological fracture: Secondary | ICD-10-CM | POA: Diagnosis present

## 2014-01-06 DIAGNOSIS — R251 Tremor, unspecified: Secondary | ICD-10-CM

## 2014-01-06 DIAGNOSIS — R5381 Other malaise: Secondary | ICD-10-CM | POA: Diagnosis present

## 2014-01-06 DIAGNOSIS — I272 Pulmonary hypertension, unspecified: Secondary | ICD-10-CM | POA: Diagnosis present

## 2014-01-06 DIAGNOSIS — I351 Nonrheumatic aortic (valve) insufficiency: Secondary | ICD-10-CM

## 2014-01-06 DIAGNOSIS — Z9049 Acquired absence of other specified parts of digestive tract: Secondary | ICD-10-CM

## 2014-01-06 DIAGNOSIS — K311 Adult hypertrophic pyloric stenosis: Secondary | ICD-10-CM

## 2014-01-06 HISTORY — DX: Basal cell carcinoma of skin of unspecified parts of face: C44.310

## 2014-01-06 HISTORY — DX: Unspecified place in unspecified non-institutional (private) residence as the place of occurrence of the external cause: Y92.009

## 2014-01-06 HISTORY — DX: Personal history of other medical treatment: Z92.89

## 2014-01-06 HISTORY — DX: Unspecified osteoarthritis, unspecified site: M19.90

## 2014-01-06 HISTORY — DX: Unspecified chronic bronchitis: J42

## 2014-01-06 HISTORY — DX: Unspecified place in unspecified non-institutional (private) residence as the place of occurrence of the external cause: W19.XXXA

## 2014-01-06 LAB — CBC WITH DIFFERENTIAL/PLATELET
Basophils Absolute: 0 10*3/uL (ref 0.0–0.1)
Basophils Relative: 0 % (ref 0–1)
EOS ABS: 0 10*3/uL (ref 0.0–0.7)
Eosinophils Relative: 0 % (ref 0–5)
HCT: 41.5 % (ref 36.0–46.0)
HEMOGLOBIN: 13.4 g/dL (ref 12.0–15.0)
LYMPHS ABS: 0.9 10*3/uL (ref 0.7–4.0)
LYMPHS PCT: 4 % — AB (ref 12–46)
MCH: 25.2 pg — AB (ref 26.0–34.0)
MCHC: 32.3 g/dL (ref 30.0–36.0)
MCV: 78 fL (ref 78.0–100.0)
MONOS PCT: 7 % (ref 3–12)
Monocytes Absolute: 1.5 10*3/uL — ABNORMAL HIGH (ref 0.1–1.0)
NEUTROS ABS: 20 10*3/uL — AB (ref 1.7–7.7)
Neutrophils Relative %: 89 % — ABNORMAL HIGH (ref 43–77)
PLATELETS: 463 10*3/uL — AB (ref 150–400)
RBC: 5.32 MIL/uL — ABNORMAL HIGH (ref 3.87–5.11)
RDW: 15.4 % (ref 11.5–15.5)
WBC: 22.4 10*3/uL — AB (ref 4.0–10.5)

## 2014-01-06 LAB — COMPREHENSIVE METABOLIC PANEL
ALT: 28 U/L (ref 0–35)
AST: 41 U/L — ABNORMAL HIGH (ref 0–37)
Albumin: 2.6 g/dL — ABNORMAL LOW (ref 3.5–5.2)
Alkaline Phosphatase: 105 U/L (ref 39–117)
BUN: 13 mg/dL (ref 6–23)
CALCIUM: 9.1 mg/dL (ref 8.4–10.5)
CO2: 25 mEq/L (ref 19–32)
Chloride: 98 mEq/L (ref 96–112)
Creatinine, Ser: 0.53 mg/dL (ref 0.50–1.10)
GFR, EST NON AFRICAN AMERICAN: 86 mL/min — AB (ref >90)
GLUCOSE: 143 mg/dL — AB (ref 70–99)
POTASSIUM: 3.9 meq/L (ref 3.7–5.3)
Sodium: 138 mEq/L (ref 137–147)
Total Bilirubin: 0.9 mg/dL (ref 0.3–1.2)
Total Protein: 5.9 g/dL — ABNORMAL LOW (ref 6.0–8.3)

## 2014-01-06 LAB — URINE MICROSCOPIC-ADD ON

## 2014-01-06 LAB — URINALYSIS, ROUTINE W REFLEX MICROSCOPIC
Bilirubin Urine: NEGATIVE
GLUCOSE, UA: NEGATIVE mg/dL
HGB URINE DIPSTICK: NEGATIVE
Ketones, ur: >80 mg/dL — AB
Nitrite: NEGATIVE
Protein, ur: 30 mg/dL — AB
SPECIFIC GRAVITY, URINE: 1.021 (ref 1.005–1.030)
Urobilinogen, UA: 1 mg/dL (ref 0.0–1.0)
pH: 7 (ref 5.0–8.0)

## 2014-01-06 LAB — CK: Total CK: 470 U/L — ABNORMAL HIGH (ref 7–177)

## 2014-01-06 LAB — GLUCOSE, CAPILLARY: GLUCOSE-CAPILLARY: 105 mg/dL — AB (ref 70–99)

## 2014-01-06 LAB — STREP PNEUMONIAE URINARY ANTIGEN: Strep Pneumo Urinary Antigen: NEGATIVE

## 2014-01-06 LAB — I-STAT CG4 LACTIC ACID, ED: LACTIC ACID, VENOUS: 1.26 mmol/L (ref 0.5–2.2)

## 2014-01-06 LAB — TROPONIN I: Troponin I: <0.3 ng/mL (ref <0.30)

## 2014-01-06 MED ORDER — LEVOFLOXACIN IN D5W 750 MG/150ML IV SOLN
750.0000 mg | Freq: Once | INTRAVENOUS | Status: DC
Start: 2014-01-06 — End: 2014-01-09
  Filled 2014-01-06: qty 150

## 2014-01-06 MED ORDER — MORPHINE SULFATE 4 MG/ML IJ SOLN
4.0000 mg | Freq: Once | INTRAMUSCULAR | Status: AC
  Administered 2014-01-06: 4 mg via INTRAVENOUS
  Filled 2014-01-06: qty 1

## 2014-01-06 MED ORDER — HEPARIN SODIUM (PORCINE) 5000 UNIT/ML IJ SOLN
5000.0000 [IU] | Freq: Three times a day (TID) | INTRAMUSCULAR | Status: DC
  Administered 2014-01-06 – 2014-01-07 (×2): 5000 [IU] via SUBCUTANEOUS
  Filled 2014-01-06 (×5): qty 1

## 2014-01-06 MED ORDER — ALBUTEROL SULFATE (2.5 MG/3ML) 0.083% IN NEBU: 2.5000 mg | INHALATION_SOLUTION | RESPIRATORY_TRACT | Status: DC | PRN

## 2014-01-06 MED ORDER — PANTOPRAZOLE SODIUM 40 MG PO TBEC
40.0000 mg | DELAYED_RELEASE_TABLET | Freq: Two times a day (BID) | ORAL | Status: DC
  Administered 2014-01-06 – 2014-01-09 (×6): 40 mg via ORAL
  Filled 2014-01-06 (×6): qty 1

## 2014-01-06 MED ORDER — ARFORMOTEROL TARTRATE 15 MCG/2ML IN NEBU
15.0000 ug | INHALATION_SOLUTION | Freq: Two times a day (BID) | RESPIRATORY_TRACT | Status: DC
  Administered 2014-01-06 – 2014-01-09 (×6): 15 ug via RESPIRATORY_TRACT
  Filled 2014-01-06 (×9): qty 2

## 2014-01-06 MED ORDER — LANSOPRAZOLE 30 MG PO TBDP: 60.0000 mg | ORAL_TABLET | Freq: Two times a day (BID) | ORAL | Status: DC

## 2014-01-06 MED ORDER — SODIUM CHLORIDE 0.9 % IV BOLUS (SEPSIS)
500.0000 mL | Freq: Once | INTRAVENOUS | Status: AC
  Administered 2014-01-06: 500 mL via INTRAVENOUS

## 2014-01-06 MED ORDER — ONDANSETRON HCL 4 MG/2ML IJ SOLN
4.0000 mg | Freq: Once | INTRAMUSCULAR | Status: AC
  Administered 2014-01-06: 4 mg via INTRAVENOUS
  Filled 2014-01-06: qty 2

## 2014-01-06 MED ORDER — SUCRALFATE 1 GM/10ML PO SUSP
1.0000 g | Freq: Two times a day (BID) | ORAL | Status: DC
  Administered 2014-01-06 – 2014-01-09 (×6): 1 g via ORAL
  Filled 2014-01-06 (×8): qty 10

## 2014-01-06 MED ORDER — DM-GUAIFENESIN ER 30-600 MG PO TB12
0.5000 | ORAL_TABLET | Freq: Two times a day (BID) | ORAL | Status: DC
  Administered 2014-01-06 – 2014-01-09 (×6): 1 via ORAL
  Filled 2014-01-06 (×7): qty 1

## 2014-01-06 MED ORDER — METOCLOPRAMIDE HCL 5 MG PO TABS
5.0000 mg | ORAL_TABLET | Freq: Two times a day (BID) | ORAL | Status: DC
  Administered 2014-01-06 – 2014-01-09 (×6): 5 mg via ORAL
  Filled 2014-01-06 (×8): qty 1

## 2014-01-06 MED ORDER — BUDESONIDE 0.25 MG/2ML IN SUSP
0.2500 mg | Freq: Two times a day (BID) | RESPIRATORY_TRACT | Status: DC
  Administered 2014-01-06 – 2014-01-09 (×6): 0.25 mg via RESPIRATORY_TRACT
  Filled 2014-01-06 (×8): qty 2

## 2014-01-06 MED ORDER — FUROSEMIDE 40 MG PO TABS
40.0000 mg | ORAL_TABLET | Freq: Every day | ORAL | Status: DC
  Administered 2014-01-06 – 2014-01-09 (×4): 40 mg via ORAL
  Filled 2014-01-06 (×4): qty 1

## 2014-01-06 MED ORDER — ZOLPIDEM TARTRATE 5 MG PO TABS
5.0000 mg | ORAL_TABLET | Freq: Every day | ORAL | Status: DC
  Administered 2014-01-06 – 2014-01-08 (×3): 5 mg via ORAL
  Filled 2014-01-06 (×3): qty 1

## 2014-01-06 MED ORDER — VITAMIN D (ERGOCALCIFEROL) 1.25 MG (50000 UNIT) PO CAPS
50000.0000 [IU] | ORAL_CAPSULE | ORAL | Status: DC
  Administered 2014-01-07: 50000 [IU] via ORAL
  Filled 2014-01-06: qty 1

## 2014-01-06 MED ORDER — HYDROCOD POLST-CHLORPHEN POLST 10-8 MG/5ML PO LQCR
5.0000 mL | Freq: Two times a day (BID) | ORAL | Status: DC | PRN
  Administered 2014-01-07 – 2014-01-09 (×3): 5 mL via ORAL
  Filled 2014-01-06 (×3): qty 5

## 2014-01-06 MED ORDER — TRAMADOL HCL 50 MG PO TABS
50.0000 mg | ORAL_TABLET | Freq: Three times a day (TID) | ORAL | Status: DC | PRN
  Administered 2014-01-08: 50 mg via ORAL
  Filled 2014-01-06: qty 1

## 2014-01-06 MED ORDER — SODIUM CHLORIDE 0.9 % IV SOLN
INTRAVENOUS | Status: DC
Start: 2014-01-06 — End: 2014-01-09

## 2014-01-06 MED ORDER — OLOPATADINE HCL 0.1 % OP SOLN
1.0000 [drp] | Freq: Two times a day (BID) | OPHTHALMIC | Status: DC
  Administered 2014-01-06 – 2014-01-09 (×6): 1 [drp] via OPHTHALMIC
  Filled 2014-01-06: qty 5

## 2014-01-06 MED ORDER — SODIUM CHLORIDE 0.9 % IV SOLN
INTRAVENOUS | Status: DC
  Administered 2014-01-06 – 2014-01-08 (×4): via INTRAVENOUS

## 2014-01-06 MED ORDER — ACETAMINOPHEN 500 MG PO TABS
500.0000 mg | ORAL_TABLET | Freq: Four times a day (QID) | ORAL | Status: DC | PRN
  Administered 2014-01-06 – 2014-01-07 (×2): 500 mg via ORAL
  Filled 2014-01-06 (×2): qty 1

## 2014-01-06 MED ORDER — PROMETHAZINE HCL 25 MG RE SUPP: 25.0000 mg | Freq: Four times a day (QID) | RECTAL | Status: DC | PRN

## 2014-01-06 NOTE — ED Notes (Signed)
Patient transported to CT 

## 2014-01-06 NOTE — Telephone Encounter (Signed)
I called spoke with [name]. Pt is going to be admitted to [facility]. FYI for MW

## 2014-01-06 NOTE — ED Notes (Signed)
Meal tray ordered 

## 2014-01-06 NOTE — ED Notes (Signed)
Cg-4 result reported To Dr. Zenia Resides

## 2014-01-06 NOTE — ED Notes (Signed)
Pt can drink per Dr. Zenia Resides

## 2014-01-06 NOTE — ED Provider Notes (Signed)
CSN: [id number]     Arrival date & time 01/06/14  1200 History   First MD Initiated Contact with Patient 01/06/14 1220     Chief Complaint  Patient presents with  . Fall  . Back Pain  . Spine Injury     (Consider location/radiation/quality/duration/timing/severity/associated sxs/prior Treatment) Patient is a 80 y.o. female presenting with fall and back pain. The history is provided by the patient and a relative.  Fall  Back Pain  patient here after falling into a bathtub when she had a mechanical fall yesterday. She has been stuck in the bathtub for 12 hours. Positive loss of consciousness. Complains of pain to her neck entire back and head. No vomiting. No confusion. No weakness in arms or legs. Denies abdominal pain. No dyspnea noted. Symptoms persisted and EMS was called and patient given fentanyl with some relief. Patient placed in c-collar and kED device and transported here.  Past Medical History  Diagnosis Date  . GERD (gastroesophageal reflux disease)   . Osteoporosis   . Postmenopausal HRT (hormone replacement therapy)   . Anemia   . Hypertension   . Allergy   . Asthma   . Shortness of breath   . PONV (postoperative nausea and vomiting)   . Pneumonia      Hx of pneumonia,50months ago  . Dysrhythmia     a fib  . Headache(784.0)   . Esophageal cancer     removed esophagus stomach pulled up   Past Surgical History  Procedure Laterality Date  . Left oophorectomy    . Esophageal removal of cancer,gastric ppull-thru 1993    . Cholecystectomy    . Rt arm fx    . Esophagogastroduodenoscopy  10/19/2011    Procedure: ESOPHAGOGASTRODUODENOSCOPY (EGD);  Surgeon: Milus Banister, MD;  Location: [facility];  Service: Endoscopy;  Laterality: N/A;  . Balloon dilation  10/19/2011    Procedure: BALLOON DILATION;  Surgeon: Milus Banister, MD;  Location: [facility];  Service: Endoscopy;  Laterality: N/A;  . Esophagogastroduodenoscopy  11/07/2011    Procedure:  ESOPHAGOGASTRODUODENOSCOPY (EGD);  Surgeon: Ladene Artist, MD,FACG;  Location: [facility]COPY;  Service: Endoscopy;  Laterality: N/A;  . Esophagogastroduodenoscopy  11/22/2011    Procedure: ESOPHAGOGASTRODUODENOSCOPY (EGD);  Surgeon: Inda Castle, MD;  Location: [facility];  Service: Endoscopy;  Laterality: N/A;  . Esophagogastroduodenoscopy  11/24/2011    Procedure: ESOPHAGOGASTRODUODENOSCOPY (EGD);  Surgeon: Inda Castle, MD;  Location: [facility];  Service: Endoscopy;  Laterality: N/A;  with removable duodenal stent (actually using esophageal partially covered 23X15 located in locked supply room  . Duodenal stent placement  11/24/2011    Procedure: DUODENAL STENT PLACEMENT;  Surgeon: Inda Castle, MD;  Location: [facility];  Service: Endoscopy;  Laterality: N/A;  . Video bronchoscopy Bilateral 01/18/2013    Procedure: VIDEO BRONCHOSCOPY WITHOUT FLUORO;  Surgeon: Tanda Rockers, MD;  Location: [facility];  Service: Cardiopulmonary;  Laterality: Bilateral;  . Esophagogastroduodenoscopy N/A 02/11/2013    Procedure: ESOPHAGOGASTRODUODENOSCOPY (EGD);  Surgeon: Inda Castle, MD;  Location: [facility];  Service: Endoscopy;  Laterality: N/A;  . Duodenal stent placement N/A 02/11/2013    Procedure: DUODENAL STENT PLACEMENT;  Surgeon: Inda Castle, MD;  Location: [facility];  Service: Endoscopy;  Laterality: N/A;  . Esophagogastroduodenoscopy N/A 09/11/2013    Procedure: ESOPHAGOGASTRODUODENOSCOPY (EGD);  Surgeon: Gatha Mayer, MD;  Location: [facility];  Service: Endoscopy;  Laterality: N/A;   Family History  Problem Relation Age of Onset  . Cervical cancer  Mother   . Heart disease Father     MI  . Coronary artery disease Brother   . Hypotension Sister   . Colon cancer Neg Hx   . Colon polyps Sister   . Pancreatic cancer      1/2 sister  . Coronary artery disease Sister     pacemaker  . Asthma Brother   . Asthma Sister    History  Substance Use Topics  . Smoking  status: Never Smoker   . Smokeless tobacco: Never Used  . Alcohol Use: No   OB History   Grav Para Term Preterm Abortions TAB SAB Ect Mult Living                 Review of Systems  Musculoskeletal: Positive for back pain.  All other systems reviewed and are negative.     Allergies  Erythromycin ethylsuccinate; Prednisone; and Doxycycline  Home Medications   Prior to Admission medications   Medication Sig Start Date End Date Taking? Authorizing Provider  acetaminophen (TYLENOL) 500 MG tablet Take 1 tablet (500 mg total) by mouth every 6 (six) hours as needed for headache. 12/02/13   Tammy S Parrett, NP  albuterol (PROVENTIL HFA;VENTOLIN HFA) 108 (90 BASE) MCG/ACT inhaler Inhale 2 puffs into the lungs every 6 (six) hours as needed. For wheezing 12/02/13   Tammy S Parrett, NP  albuterol (PROVENTIL) (2.5 MG/3ML) 0.083% nebulizer solution Take 3 mLs (2.5 mg total) by nebulization every 4 (four) hours as needed for wheezing or shortness of breath. 12/10/13   Tanda Rockers, MD  arformoterol (BROVANA) 15 MCG/2ML NEBU Take 2 mLs (15 mcg total) by nebulization 2 (two) times daily. Dx: 493.00 12/02/13   Tammy S Parrett, NP  budesonide (PULMICORT) 0.25 MG/2ML nebulizer solution Take 2 mLs (0.25 mg total) by nebulization 2 (two) times daily. Dx: 493.00 12/02/13   Melvenia Needles, NP  chlorpheniramine-HYDROcodone (TUSSIONEX PENNKINETIC ER) 10-8 MG/5ML LQCR Take 5 mLs by mouth every 12 (twelve) hours as needed for cough. 12/10/13   Tanda Rockers, MD  dextromethorphan-guaiFENesin [facility] DM) 30-600 MG per 12 hr tablet Take 1 tablet by mouth 2 (two) times daily as needed for cough. 12/02/13   Tammy S Parrett, NP  digoxin (LANOXIN) 0.125 MG tablet Take 1 tablet (0.125 mg total) by mouth daily. 12/21/12 12/21/13  Ricard Dillon, MD  Fe Fum-FA-B Cmp-C-Zn-Mg-Mn-Cu (HEMOCYTE PLUS) 106-1 MG CAPS Take 1 tablet by mouth 2 (two) times daily. 12/02/13   Tammy S Parrett, NP  furosemide (LASIX) 40 MG tablet Take 1 tablet (40  mg total) by mouth daily. 10/11/13   Timoteo Gaul, FNP  lansoprazole (PREVACID SOLUTAB) 30 MG disintegrating tablet Take 2 tablets (60 mg total) by mouth 2 (two) times daily. 10/08/13   Inda Castle, MD  Methylcobalamin (B-12) 5000 MCG TBDP Take 1 tablet by mouth daily.    Historical Provider, MD  metoCLOPramide (REGLAN) 5 MG tablet Take 1 tablet (5 mg total) by mouth 2 (two) times daily. 10/09/13   Ricard Dillon, MD  Multiple Vitamins-Calcium (VIACTIV MULTI-VITAMIN) CHEW Chew 1 each by mouth daily. 12/02/13   Tammy S Parrett, NP  Olopatadine HCl (PATADAY) 0.2 % SOLN Apply 1-2 drops to eye 2 (two) times daily as needed (dry, itchy eyes.).    Historical Provider, MD  promethazine (PHENERGAN) 25 MG suppository Place 25 mg rectally every 6 (six) hours as needed for nausea or vomiting.    Historical Provider, MD  Respiratory Therapy Supplies (FLUTTER)  DEVI Take as directed 11/12/13   Tanda Rockers, MD  sucralfate (CARAFATE) 1 GM/10ML suspension TAKE 10 MLS (1 GM TOTAL)   TWICE A DAY.. SEND A 90 DAY SUPPLY 12/30/13   Timoteo Gaul, FNP  traMADol (ULTRAM) 50 MG tablet Take 1 tablet (50 mg total) by mouth every 8 (eight) hours as needed for moderate pain. 09/17/13   Allie Bossier, MD  Vitamin D, Ergocalciferol, (DRISDOL) 50000 UNITS CAPS capsule Take 1 capsule (50,000 Units total) by mouth every 7 (seven) days. On Friday 12/10/13   Ricard Dillon, MD  warfarin (COUMADIN) 5 MG tablet Take 5 mg by mouth daily.  04/23/13   Historical Provider, MD  zolpidem (AMBIEN) 5 MG tablet Take 1 tablet (5 mg total) by mouth at bedtime as needed for sleep. 12/02/13 12/02/14  Ricard Dillon, MD   BP 111/71  Temp(Src) 97.9 F (36.6 C) (Oral)  Resp 17  SpO2 97% Physical Exam  Nursing note and vitals reviewed. Constitutional: She is oriented to person, place, and time. She appears well-developed and well-nourished.  Non-toxic appearance. No distress.  HENT:  Head: Normocephalic and atraumatic.  Eyes: Conjunctivae,  EOM and lids are normal. Pupils are equal, round, and reactive to light.  Neck: Normal range of motion. Neck supple. No tracheal deviation present. No mass present.    Cardiovascular: Normal rate, regular rhythm and normal heart sounds.  Exam reveals no gallop.   No murmur heard. Pulmonary/Chest: Effort normal and breath sounds normal. No stridor. No respiratory distress. She has no decreased breath sounds. She has no wheezes. She has no rhonchi. She has no rales. She exhibits bony tenderness. She exhibits no crepitus.    Abdominal: Soft. Normal appearance and bowel sounds are normal. She exhibits no distension. There is no tenderness. There is no rebound and no CVA tenderness.  Musculoskeletal: Normal range of motion. She exhibits no edema and no tenderness.       Arms: Neurological: She is alert and oriented to person, place, and time. She has normal strength. No cranial nerve deficit or sensory deficit. GCS eye subscore is 4. GCS verbal subscore is 5. GCS motor subscore is 6.  Skin: Skin is warm and dry. No abrasion and no rash noted.  Psychiatric: She has a normal mood and affect. Her speech is normal and behavior is normal.    ED Course  Procedures (including critical care time) Labs Review Labs Reviewed  URINE CULTURE  CBC WITH DIFFERENTIAL  URINALYSIS, ROUTINE W REFLEX MICROSCOPIC  COMPREHENSIVE METABOLIC PANEL  CK  TROPONIN I  I-STAT CG4 LACTIC ACID, ED    Imaging Review No results found.   EKG Interpretation   Date/Time:  Monday January 06 2014 12:03:01 EDT Ventricular Rate:  109 PR Interval:  149 QRS Duration: 71 QT Interval:  371 QTC Calculation: 500 R Axis:   -31 Text Interpretation:  Sinus tachycardia Multiple premature complexes, vent   Left axis deviation Anteroseptal infarct, age indeterminate Confirmed by  Ladamien Rammel  MD, Catalena Stanhope ([id number]) on 01/06/2014 2:40:15 PM      MDM   Final diagnoses:  None   Patient started on Levaquin for her pneumonia. He has a  moderate leukocytosis as well as possible early rhabdomyolysis. She has evidence of dehydration as well. Her C-spine has been cleared. IV fluids have been ordered. Spoke with triad hospitalist and patient will be admitted    Leota Jacobsen, MD 01/06/14 [phone]

## 2014-01-06 NOTE — ED Notes (Addendum)
80 yo female was found by neighbor in her garden bath tub. Pt remained A/O during the event. Reports tripping and falling into the bath tub and had been there for 12 hours. FDP reports pt laying in the bath tub with neck malpositioned, potted soil and broken pot. [facility] noted an indentation on the spine, back and generalized body pain. Pt in [place] with C-collar. Gave 200 mcg of Fent. Vitals stable, A/O. Pain 6/10

## 2014-01-06 NOTE — Progress Notes (Signed)
[facility] History and Physical  MELESA [name] LNL:[id number] DOB: 01/11/1929 DOA: 01/06/2014  Referring physician: ED PCP: Donia Ast, FNP  Specialists: none  Chief Complaint: Pneumonia  HPI: [name] is a 80 y.o. female came to ED  01/06/2014.  SHehcarries a history of hx of esophageal cancer + gastric outlet syndrome w/ prev. Esophagogastrectomy ~ 1993 with gastric pull-through-complications of hematemesis 09/09/13, prior gastroparesis, recurrent admissions for Aspiration Pna and multiple endoscopic interventions including 4 daily dose dense followed at Dr. Deatra Ina, chronic Jerrye Bushy presented to [facility] today after being found down in her bathroom for an unknown period of time. She is a relatively good historian and history is supplemented by her niece [name] who is her health care power of attorney. She has been sick and congestion for the past one to 2 weeks and recently saw Dr. Melvyn Novas of pulmonary medicine on 5/22, 5/13 2014-on the last office visit as there was no purulent sputum or acute changes on chest x-ray, patient was not prescribed any antibiotics at that time. She was noted to have hemoptysis and Coumadin was discontinued. A chest x-ray done on 5/22 showed stable chest with severe cardiomegaly no CHF. Her niece reports that she went to see the patient yesterday and she was having increasing congestion. She allegedly went to the bathroom and had a fall.  She cannot tell me what exactly transpired that time that she fell. She states that she was weak in her feet. When I ask her negative history of dizziness or room spinning around her she says she did not have any of those specific symptoms felt weak in her legs. Her family and her niece confirm that she's been eating less than usual and has a very specific way of eating so as to not aspirate. Did mention also that she she's been off and on antibiotics chronically and every time she seems come off  antibiotics she has recurrence of what appears to be aspiration pneumonia. She was taken off her Coumadin about a month ago, 12/11/13 because of hemoptysis-she had been on this for purported atrial fibrillation which family states she probably never had.  Labs in ED 7 with a calcium 3.9 this BUN 13 creatinine 0.5 glucose 143 abdomen 2.6 AST 41/ALT 28 CK 470, Lactic acid 1.2 WBC 22.4 hemoglobin 13.4 hematocrit 41, platelet 463 CT head partial opacification right sphenoid, CT cervical spine showed no acute cervical fracture no abnormal paravertebral soft tissue injury Lumbar and thoracic x-rays within normal limits Chest x-ray two-view shows patchy left side airspace disease potentially pneumonia    Review of Systems: The patient denies chills but has been feverish on and off. She's had no vomiting has had a pinkish cough no dysuria, no hematuria, no chills no nausea no vomiting no ill contacts She has a chronically poor appetite  Past Medical History  Diagnosis Date  . GERD (gastroesophageal reflux disease)   . Osteoporosis   . Postmenopausal HRT (hormone replacement therapy)   . Anemia   . Hypertension   . Allergy   . Asthma   . Shortness of breath   . PONV (postoperative nausea and vomiting)   . Pneumonia      Hx of pneumonia,94months ago  . Dysrhythmia     a fib  . Headache(784.0)   . Esophageal cancer     removed esophagus stomach pulled up   Past Surgical History  Procedure Laterality Date  . Left oophorectomy    . Esophageal removal  of cancer,gastric ppull-thru 1993    . Cholecystectomy    . Rt arm fx    . Esophagogastroduodenoscopy  10/19/2011    Procedure: ESOPHAGOGASTRODUODENOSCOPY (EGD);  Surgeon: Milus Banister, MD;  Location: [facility];  Service: Endoscopy;  Laterality: N/A;  . Balloon dilation  10/19/2011    Procedure: BALLOON DILATION;  Surgeon: Milus Banister, MD;  Location: [facility] ENDOSCOPY;  Service: Endoscopy;  Laterality: N/A;  . Esophagogastroduodenoscopy   11/07/2011    Procedure: ESOPHAGOGASTRODUODENOSCOPY (EGD);  Surgeon: Ladene Artist, MD,FACG;  Location: [facility] ENDOSCOPY;  Service: Endoscopy;  Laterality: N/A;  . Esophagogastroduodenoscopy  11/22/2011    Procedure: ESOPHAGOGASTRODUODENOSCOPY (EGD);  Surgeon: Inda Castle, MD;  Location: [facility];  Service: Endoscopy;  Laterality: N/A;  . Esophagogastroduodenoscopy  11/24/2011    Procedure: ESOPHAGOGASTRODUODENOSCOPY (EGD);  Surgeon: Inda Castle, MD;  Location: [facility];  Service: Endoscopy;  Laterality: N/A;  with removable duodenal stent (actually using esophageal partially covered 23X15 located in locked supply room  . Duodenal stent placement  11/24/2011    Procedure: DUODENAL STENT PLACEMENT;  Surgeon: Inda Castle, MD;  Location: [facility];  Service: Endoscopy;  Laterality: N/A;  . Video bronchoscopy Bilateral 01/18/2013    Procedure: VIDEO BRONCHOSCOPY WITHOUT FLUORO;  Surgeon: Tanda Rockers, MD;  Location: [facility] ENDOSCOPY;  Service: Cardiopulmonary;  Laterality: Bilateral;  . Esophagogastroduodenoscopy N/A 02/11/2013    Procedure: ESOPHAGOGASTRODUODENOSCOPY (EGD);  Surgeon: Inda Castle, MD;  Location: [facility];  Service: Endoscopy;  Laterality: N/A;  . Duodenal stent placement N/A 02/11/2013    Procedure: DUODENAL STENT PLACEMENT;  Surgeon: Inda Castle, MD;  Location: [facility];  Service: Endoscopy;  Laterality: N/A;  . Esophagogastroduodenoscopy N/A 09/11/2013    Procedure: ESOPHAGOGASTRODUODENOSCOPY (EGD);  Surgeon: Gatha Mayer, MD;  Location: [facility];  Service: Endoscopy;  Laterality: N/A;   Social History:  History   Social History Narrative   DAILY CAFFEINE    USE    Allergies  Allergen Reactions  . Erythromycin Ethylsuccinate     Irregular pulse rate  . Prednisone     "makes me hyper"  . Doxycycline Rash    Family History  Problem Relation Age of Onset  . Cervical cancer Mother   . Heart disease Father     MI  . Coronary artery  disease Brother   . Hypotension Sister   . Colon cancer Neg Hx   . Colon polyps Sister   . Pancreatic cancer      1/2 sister  . Coronary artery disease Sister     pacemaker  . Asthma Brother   . Asthma Sister     Prior to Admission medications   Medication Sig Start Date End Date Taking? Authorizing Provider  acetaminophen (TYLENOL) 500 MG tablet Take 1 tablet (500 mg total) by mouth every 6 (six) hours as needed for headache. 12/02/13  Yes Tammy S Parrett, NP  albuterol (PROVENTIL HFA;VENTOLIN HFA) 108 (90 BASE) MCG/ACT inhaler Inhale 2 puffs into the lungs every 6 (six) hours as needed. For wheezing 12/02/13  Yes Tammy S Parrett, NP  albuterol (PROVENTIL) (2.5 MG/3ML) 0.083% nebulizer solution Take 3 mLs (2.5 mg total) by nebulization every 4 (four) hours as needed for wheezing or shortness of breath. 12/10/13  Yes Tanda Rockers, MD  arformoterol (BROVANA) 15 MCG/2ML NEBU Take 2 mLs (15 mcg total) by nebulization 2 (two) times daily. Dx: 493.00 12/02/13  Yes Tammy S Parrett, NP  budesonide (PULMICORT) 0.25 MG/2ML nebulizer solution Take 2  mLs (0.25 mg total) by nebulization 2 (two) times daily. Dx: 493.00 12/02/13  Yes Tammy S Parrett, NP  chlorpheniramine-HYDROcodone (TUSSIONEX PENNKINETIC ER) 10-8 MG/5ML LQCR Take 5 mLs by mouth every 12 (twelve) hours as needed for cough. 12/10/13  Yes Tanda Rockers, MD  dextromethorphan-guaiFENesin [facility] DM) 30-600 MG per 12 hr tablet Take 0.5 tablets by mouth 2 (two) times daily.   Yes Historical Provider, MD  digoxin (LANOXIN) 0.125 MG tablet Take 1 tablet (0.125 mg total) by mouth daily. 12/21/12 01/06/14 Yes Ricard Dillon, MD  Fe Fum-FA-B Cmp-C-Zn-Mg-Mn-Cu (HEMOCYTE PLUS) 106-1 MG CAPS Take 1 tablet by mouth 2 (two) times daily. 12/02/13  Yes Tammy S Parrett, NP  furosemide (LASIX) 40 MG tablet Take 1 tablet (40 mg total) by mouth daily. 10/11/13  Yes Timoteo Gaul, FNP  lansoprazole (PREVACID SOLUTAB) 30 MG disintegrating tablet Take 2 tablets (60 mg  total) by mouth 2 (two) times daily. 10/08/13  Yes Inda Castle, MD  Methylcobalamin (B-12) 5000 MCG TBDP Take 1 tablet by mouth daily.   Yes Historical Provider, MD  metoCLOPramide (REGLAN) 5 MG tablet Take 1 tablet (5 mg total) by mouth 2 (two) times daily. 10/09/13  Yes Ricard Dillon, MD  Multiple Vitamins-Calcium (VIACTIV MULTI-VITAMIN) CHEW Chew 1 each by mouth daily. 12/02/13  Yes Tammy S Parrett, NP  Olopatadine HCl (PATADAY) 0.2 % SOLN Apply 1-2 drops to eye 2 (two) times daily as needed (dry, itchy eyes.).   Yes Historical Provider, MD  promethazine (PHENERGAN) 25 MG suppository Place 25 mg rectally every 6 (six) hours as needed for nausea or vomiting.   Yes Historical Provider, MD  sucralfate (CARAFATE) 1 GM/10ML suspension Take 1 g by mouth 2 (two) times daily.   Yes Historical Provider, MD  traMADol (ULTRAM) 50 MG tablet Take 1 tablet (50 mg total) by mouth every 8 (eight) hours as needed for moderate pain. 09/17/13  Yes Allie Bossier, MD  Vitamin D, Ergocalciferol, (DRISDOL) 50000 UNITS CAPS capsule Take 1 capsule (50,000 Units total) by mouth every 7 (seven) days. On Friday 12/10/13  Yes Ricard Dillon, MD  zolpidem (AMBIEN) 5 MG tablet Take 5 mg by mouth at bedtime.   Yes Historical Provider, MD   Physical Exam: Filed Vitals:   01/06/14 1350 01/06/14 1426 01/06/14 1430 01/06/14 1556  BP: 128/83 128/83 114/79   Pulse:   105   Temp:      TempSrc:      Resp: 20 22 23 18   SpO2: 95% 100% 97% 94%     General:  Frail pleasant tangential Caucasian female  Eyes: Postop changes to pupils bilaterally, mild pallor noted gross  ENT: Soft supple no thyromegaly  Neck: No JVD  Cardiovascular: S1-S2 no murmur rub or gallop  Respiratory: Transmitted sounds, no fremitus no resident  Abdomen: Soft nontender nondistended no rebound  Skin: No lower extremity edema  Musculoskeletal: Range of motion grossly intact  Psychiatric: Euthymic  Neurologic: Power 5/5 bilaterally however  slightly weaker on the left side-she states that this is long-standing since her survey to gastric me. Lower extremities reveal 5/5 power with normal sensation bilaterally plantar dorsiflexion is somewhat limited   Labs on Admission:  Basic Metabolic Panel:  Recent Labs Lab 01/06/14 1402  NA 138  K 3.9  CL 98  CO2 25  GLUCOSE 143*  BUN 13  CREATININE 0.53  CALCIUM 9.1   Liver Function Tests:  Recent Labs Lab 01/06/14 1402  AST 41*  ALT  28  ALKPHOS 105  BILITOT 0.9  PROT 5.9*  ALBUMIN 2.6*   No results found for this basename: LIPASE, AMYLASE,  in the last 168 hours No results found for this basename: AMMONIA,  in the last 168 hours CBC:  Recent Labs Lab 01/06/14 1402  WBC 22.4*  NEUTROABS 20.0*  HGB 13.4  HCT 41.5  MCV 78.0  PLT 463*   Cardiac Enzymes:  Recent Labs Lab 01/06/14 1402  CKTOTAL 470*  TROPONINI <0.30    BNP (last 3 results)  Recent Labs  02/25/13 1108  PROBNP 185.0*   CBG: No results found for this basename: GLUCAP,  in the last 168 hours  Radiological Exams on Admission: Dg Chest 2 View  01/06/2014   CLINICAL DATA:  Fall with pain in the mid to lower back.  EXAM: CHEST  2 VIEW  COMPARISON:  12/20/2013.  CT chest 09/12/2013.  FINDINGS: Surgical clips in the expected location of the thyroid. Trachea is midline. Heart is enlarged. Thoracic aorta is calcified. A long wall stent projects over the lower thoracic spine and traverses a gastric pull-through when compared with 09/12/2013. Patchy left basilar airspace disease. Small left pleural effusion. Osteopenia without definite compression fracture.  IMPRESSION: 1. Patchy left basilar airspace disease may be due to pneumonia. 2. Small left pleural effusion. 3. Osteopenia without definite fracture.   Electronically Signed   By: Lorin Picket M.D.   On: 01/06/2014 13:36   Dg Thoracic Spine 2 View  01/06/2014   CLINICAL DATA:  Fall.  Back pain.  EXAM: THORACIC SPINE - 2 VIEW  COMPARISON:   Lateral chest radiograph 12/20/2013  FINDINGS: No fracture. No spondylolisthesis. There are mild disc degenerative changes along the mid lower thoracic spine. Bones are diffusely demineralized. Vena cava stent extends from the mid chest to the central upper abdomen.  IMPRESSION: No fracture or acute finding.   Electronically Signed   By: Lajean Manes M.D.   On: 01/06/2014 13:35   Dg Lumbar Spine Complete  01/06/2014   CLINICAL DATA:  80 year old female status post fall with pain. Initial encounter. History of esophagectomy with gastric pull-through and stenting  EXAM: LUMBAR SPINE - COMPLETE 4+ VIEW  COMPARISON:  Abdominal 11/17/2011.  Chest CT 09/12/2013.  FINDINGS: Metallic stents in the distal stomach and right upper quadrant Stable from the recent CT. Stable right upper quadrant surgical clips. Multiple surgical clips projecting at the mediastinum and left lung base re- identified. Transitional lumbosacral anatomy, suspect L5 level being mostly sacralized. Anterolisthesis of L4 on L5 measuring up to 9 mm. Moderate to severe lower lumbar facet hypertrophy and degeneration. Relatively preserved disc spaces. Elsewhere lumbar vertebral height and alignment within normal limits. Grossly intact visualized lower thoracic levels. Sacral ala and SI joints within normal limits. Sclerosis at the pubic symphysis probably is degenerative.  IMPRESSION: 1. Transitional lumbosacral anatomy suspected with sacralized L5 level. Grade 1-2 anterolisthesis of L4 on L5 with advanced facet degeneration. 2.  No acute osseous abnormality identified in the lumbar spine. 3. Postoperative changes to the mediastinum with gastric pull-through metal stent extending into the right upper quadrant.   Electronically Signed   By: Lars Pinks M.D.   On: 01/06/2014 13:39   Ct Head Wo Contrast  01/06/2014   CLINICAL DATA:  Found down.  Fell in bathtub.  EXAM: CT HEAD WITHOUT CONTRAST  CT CERVICAL SPINE WITHOUT CONTRAST  TECHNIQUE: Multidetector CT  imaging of the head and cervical spine was performed following the standard protocol without  intravenous contrast. Multiplanar CT image reconstructions of the cervical spine were also generated.  COMPARISON:  01/15/2013 head CT. Scout view of the paranasal sinus CT 01/15/2013. Brain MR 08/17/2004.  FINDINGS: CT HEAD FINDINGS  No skull fracture or intracranial hemorrhage. Small vessel disease type changes without CT evidence of large acute infarct. Partial opacification right sphenoid sinus air cell and upper aspect of the right pterygoid plate containing hyperdense material which may represent inspissated material. No intracranial mass lesion noted on this unenhanced exam. No hydrocephalus.  CT CERVICAL SPINE FINDINGS  No cervical spine fracture noted.  Minimal anterior slip of C3 and C4 felt to be related to facet joint degenerative changes.  Minimal anterior widening of the posterior aspect of the left C5-6 and C6-7 facet joint. This may be normal for this patient rather than related to injury however if there was a high clinical suspicion of ligamentous injury, MR or flexion and extension views could be obtained for further delineation.  C5-6 and C6-7 disc degeneration with broad-based spur causing mild spinal stenosis and cord flattening.  No abnormal prevertebral soft tissue swelling.  Dilated esophagus. Patient has an esophageal stent in place and this may be related to treatment of tumor although incompletely assessed.  IMPRESSION:  CT head:  No skull fracture or intracranial hemorrhage.  Small vessel disease type changes without CT evidence of large acute infarct.  Partial opacification right sphenoid sinus air cell and upper aspect of the right pterygoid plate containing hyperdense material which may represent inspissated material.  CT cervical spine:  No cervical spine fracture noted.  Minimal anterior slip of C3 and C4 felt to be related to facet joint degenerative changes.  Minimal anterior widening of  the posterior aspect of the left C5-6 and C6-7 facet joint. This may be normal for this patient rather than related to injury however if there was a high clinical suspicion of ligamentous injury, MR or flexion and extension views could be obtained for further delineation.  C5-6 and C6-7 disc degeneration with broad-based spur causing mild spinal stenosis and cord flattening.  No abnormal prevertebral soft tissue swelling.  Dilated esophagus. Patient has an esophageal stent in place and this may be related to treatment of tumor although incompletely assessed.   Electronically Signed   By: Chauncey Cruel M.D.   On: 01/06/2014 14:05   Ct Cervical Spine Wo Contrast  01/06/2014   CLINICAL DATA:  Found down.  Fell in bathtub.  EXAM: CT HEAD WITHOUT CONTRAST  CT CERVICAL SPINE WITHOUT CONTRAST  TECHNIQUE: Multidetector CT imaging of the head and cervical spine was performed following the standard protocol without intravenous contrast. Multiplanar CT image reconstructions of the cervical spine were also generated.  COMPARISON:  01/15/2013 head CT. Scout view of the paranasal sinus CT 01/15/2013. Brain MR 08/17/2004.  FINDINGS: CT HEAD FINDINGS  No skull fracture or intracranial hemorrhage. Small vessel disease type changes without CT evidence of large acute infarct. Partial opacification right sphenoid sinus air cell and upper aspect of the right pterygoid plate containing hyperdense material which may represent inspissated material. No intracranial mass lesion noted on this unenhanced exam. No hydrocephalus.  CT CERVICAL SPINE FINDINGS  No cervical spine fracture noted.  Minimal anterior slip of C3 and C4 felt to be related to facet joint degenerative changes.  Minimal anterior widening of the posterior aspect of the left C5-6 and C6-7 facet joint. This may be normal for this patient rather than related to injury however if there was a  high clinical suspicion of ligamentous injury, MR or flexion and extension views could  be obtained for further delineation.  C5-6 and C6-7 disc degeneration with broad-based spur causing mild spinal stenosis and cord flattening.  No abnormal prevertebral soft tissue swelling.  Dilated esophagus. Patient has an esophageal stent in place and this may be related to treatment of tumor although incompletely assessed.  IMPRESSION:  CT head:  No skull fracture or intracranial hemorrhage.  Small vessel disease type changes without CT evidence of large acute infarct.  Partial opacification right sphenoid sinus air cell and upper aspect of the right pterygoid plate containing hyperdense material which may represent inspissated material.  CT cervical spine:  No cervical spine fracture noted.  Minimal anterior slip of C3 and C4 felt to be related to facet joint degenerative changes.  Minimal anterior widening of the posterior aspect of the left C5-6 and C6-7 facet joint. This may be normal for this patient rather than related to injury however if there was a high clinical suspicion of ligamentous injury, MR or flexion and extension views could be obtained for further delineation.  C5-6 and C6-7 disc degeneration with broad-based spur causing mild spinal stenosis and cord flattening.  No abnormal prevertebral soft tissue swelling.  Dilated esophagus. Patient has an esophageal stent in place and this may be related to treatment of tumor although incompletely assessed.   Electronically Signed   By: Chauncey Cruel M.D.   On: 01/06/2014 14:05    EKG: Independently reviewed.  status post PVCs with left anterior fascicular block   Assessment/Plan Principal Problem:   Fall at home-unclear etiology. Patient has questionable history of atrial fibrillation-on last office visit with cardiology she was found to be in normal sinus rhythm and it was questioned whether atrial fibrillation was due to hospitalization and acute stress.  We will rule out stroke I MRI of the brain and if this does not show anything specific than  we can rest assured that her tachyarrhythmias probably just SVT. Keep her on telemetry monitoring overnight just he ensure that this is the case and we will also get orthostatic vital signs as she may have fallen because of orthostasis.  Echocardiogram has been ordered as she does have a history of aortic regurgitation which may have contributed to this. Physical therapy and occupational therapy should see her and evaluate her for safety as well as best disposition. This is her first fall at in the setting of illness she may need short term rehabilitation at a facility--have not ordered carotid duplex but this can be ordered subsequently if indeed stroke shows up on MRI of the brain Active Problems:   Aspiration pneumonia-We will place her on ceftriaxone and azithromycin IV for now.  She will probably need to be narrowed to Augmentin in 2-3 days. Repeat complete blood count in the morning   aNEMIA-IRON DEFICIENCY stable currently-   HX OF ESOPHAGEAL CANCER-  Esophageal stricture, Hx of esophagectomy-this is stable however she has had some problems with chronic reflux in addition to multiple duodenal stents. Had a long and frank discussion with her family members above discussion with her primary care physician as well as with patient herself when she is in normal state of health as she may need goals of care and this will need to be addressed continuousl   Atrial fibrillation with rapid ventricular response-this is a questionable diagnosis and we'll need to monitor her on telemetry to rule this out--- during this admission I discontinued her digoxin  on admission and this will need to be monitored   Anticoagulant long-term use-she has been taken off Coumadin 5/13   Chronic diastolic CHF (congestive heart failure)   Pulmonary hypertension-currently appears euvolemic.   History of chronic reflux-continue sucralfate 1 g twice a day, Reglan 5 mg twice a day, Phenergan 25 mg every 6 when necessary nausea   ?  Asthma/COPD-she's never been diagnosed at bedtime only takes her inhalers for her chronic flares. She will continue her Pulmicort 0.25 twice a day, albuterol 2.5 every 4 when necessary, Mucinex 1 tablet twice a day0--she is on Tussionex by mouth every 12 hours and this may need. Rest as this and commonly for some time medications could make her sleepy    Insomnia-I would recommend tapering her 5 mg open during this hospital stay as she may have fallen because of somnolence   Code Status:  DNR/DNI confirmed with patient   Family Communication:  discussed in detail with [name] her care power of attorney at the bedside  Disposition Plan:  inpatient   Time spent: 100 min  [facility] Pager [phone]  If 7PM-7AM, please contact night-coverage [email or web address] Password [facility] 01/06/2014, 3:57 PM

## 2014-01-06 NOTE — ED Notes (Signed)
Eating dinner tray  °

## 2014-01-06 NOTE — ED Notes (Signed)
Per Dr. Zenia Resides place pt on 2L of oxygen

## 2014-01-07 ENCOUNTER — Ambulatory Visit: Payer: Medicare Other | Admitting: Family

## 2014-01-07 DIAGNOSIS — J159 Unspecified bacterial pneumonia: Secondary | ICD-10-CM

## 2014-01-07 DIAGNOSIS — I10 Essential (primary) hypertension: Secondary | ICD-10-CM

## 2014-01-07 DIAGNOSIS — W19XXXA Unspecified fall, initial encounter: Secondary | ICD-10-CM

## 2014-01-07 DIAGNOSIS — Y92009 Unspecified place in unspecified non-institutional (private) residence as the place of occurrence of the external cause: Secondary | ICD-10-CM

## 2014-01-07 DIAGNOSIS — J690 Pneumonitis due to inhalation of food and vomit: Principal | ICD-10-CM

## 2014-01-07 DIAGNOSIS — I4891 Unspecified atrial fibrillation: Secondary | ICD-10-CM

## 2014-01-07 DIAGNOSIS — Z9889 Other specified postprocedural states: Secondary | ICD-10-CM

## 2014-01-07 DIAGNOSIS — R5381 Other malaise: Secondary | ICD-10-CM

## 2014-01-07 DIAGNOSIS — R042 Hemoptysis: Secondary | ICD-10-CM

## 2014-01-07 LAB — GLUCOSE, CAPILLARY
GLUCOSE-CAPILLARY: 117 mg/dL — AB (ref 70–99)
Glucose-Capillary: 129 mg/dL — ABNORMAL HIGH (ref 70–99)
Glucose-Capillary: 99 mg/dL (ref 70–99)
Glucose-Capillary: 124 mg/dL — ABNORMAL HIGH (ref 70–99)

## 2014-01-07 LAB — EXPECTORATED SPUTUM ASSESSMENT W GRAM STAIN, RFLX TO RESP C

## 2014-01-07 LAB — BASIC METABOLIC PANEL
BUN: 12 mg/dL (ref 6–23)
CHLORIDE: 100 meq/L (ref 96–112)
CO2: 25 mEq/L (ref 19–32)
Calcium: 7.9 mg/dL — ABNORMAL LOW (ref 8.4–10.5)
Creatinine, Ser: 0.65 mg/dL (ref 0.50–1.10)
GFR calc Af Amer: >90 mL/min (ref >90)
GFR calc non Af Amer: 80 mL/min — ABNORMAL LOW (ref >90)
GLUCOSE: 140 mg/dL — AB (ref 70–99)
POTASSIUM: 3.5 meq/L — AB (ref 3.7–5.3)
Sodium: 136 mEq/L — ABNORMAL LOW (ref 137–147)

## 2014-01-07 LAB — CBC WITH DIFFERENTIAL/PLATELET
BASOS ABS: 0 10*3/uL (ref 0.0–0.1)
Basophils Relative: 0 % (ref 0–1)
Eosinophils Absolute: 0 10*3/uL (ref 0.0–0.7)
Eosinophils Relative: 0 % (ref 0–5)
HCT: 35.7 % — ABNORMAL LOW (ref 36.0–46.0)
HEMOGLOBIN: 11.4 g/dL — AB (ref 12.0–15.0)
LYMPHS PCT: 7 % — AB (ref 12–46)
Lymphs Abs: 1 10*3/uL (ref 0.7–4.0)
MCH: 24.9 pg — ABNORMAL LOW (ref 26.0–34.0)
MCHC: 31.9 g/dL (ref 30.0–36.0)
MCV: 77.9 fL — ABNORMAL LOW (ref 78.0–100.0)
MONOS PCT: 12 % (ref 3–12)
Monocytes Absolute: 1.9 10*3/uL — ABNORMAL HIGH (ref 0.1–1.0)
NEUTROS ABS: 12.7 10*3/uL — AB (ref 1.7–7.7)
Neutrophils Relative %: 81 % — ABNORMAL HIGH (ref 43–77)
Platelets: 426 10*3/uL — ABNORMAL HIGH (ref 150–400)
RBC: 4.58 MIL/uL (ref 3.87–5.11)
RDW: 15.4 % (ref 11.5–15.5)
WBC: 15.6 10*3/uL — AB (ref 4.0–10.5)

## 2014-01-07 LAB — MAGNESIUM: Magnesium: 1.5 mg/dL (ref 1.5–2.5)

## 2014-01-07 LAB — LEGIONELLA ANTIGEN, URINE: Legionella Antigen, Urine: NEGATIVE

## 2014-01-07 LAB — TSH: TSH: 3.39 u[IU]/mL (ref 0.350–4.500)

## 2014-01-07 LAB — EXPECTORATED SPUTUM ASSESSMENT W REFEX TO RESP CULTURE

## 2014-01-07 LAB — CK: Total CK: 206 U/L — ABNORMAL HIGH (ref 7–177)

## 2014-01-07 MED ORDER — DIGOXIN 0.25 MG/ML IJ SOLN
0.5000 mg | Freq: Once | INTRAMUSCULAR | Status: AC
  Administered 2014-01-07: 0.5 mg via INTRAVENOUS
  Filled 2014-01-07: qty 2

## 2014-01-07 MED ORDER — AMIODARONE HCL IN DEXTROSE 360-4.14 MG/200ML-% IV SOLN
30.0000 mg/h | INTRAVENOUS | Status: DC
  Administered 2014-01-08 (×3): 30 mg/h via INTRAVENOUS
  Filled 2014-01-07 (×6): qty 200

## 2014-01-07 MED ORDER — METOPROLOL TARTRATE 1 MG/ML IV SOLN
2.5000 mg | Freq: Four times a day (QID) | INTRAVENOUS | Status: DC | PRN
  Filled 2014-01-07: qty 5

## 2014-01-07 MED ORDER — AMIODARONE LOAD VIA INFUSION
150.0000 mg | Freq: Once | INTRAVENOUS | Status: AC
  Administered 2014-01-08: 150 mg via INTRAVENOUS
  Filled 2014-01-07: qty 83.34

## 2014-01-07 MED ORDER — LEVALBUTEROL HCL 0.63 MG/3ML IN NEBU: 0.6300 mg | INHALATION_SOLUTION | RESPIRATORY_TRACT | Status: DC | PRN

## 2014-01-07 MED ORDER — POTASSIUM CHLORIDE 10 MEQ/100ML IV SOLN
10.0000 meq | INTRAVENOUS | Status: AC
  Administered 2014-01-07 (×4): 10 meq via INTRAVENOUS
  Filled 2014-01-07 (×4): qty 100

## 2014-01-07 MED ORDER — ENSURE COMPLETE PO LIQD
237.0000 mL | Freq: Every day | ORAL | Status: DC
  Administered 2014-01-09: 237 mL via ORAL

## 2014-01-07 MED ORDER — DIGOXIN 125 MCG PO TABS
0.1250 mg | ORAL_TABLET | Freq: Every day | ORAL | Status: DC
  Administered 2014-01-08 – 2014-01-09 (×2): 0.125 mg via ORAL
  Filled 2014-01-07 (×2): qty 1

## 2014-01-07 MED ORDER — SODIUM CHLORIDE 0.9 % IV SOLN
3.0000 g | Freq: Three times a day (TID) | INTRAVENOUS | Status: DC
  Administered 2014-01-07 – 2014-01-09 (×7): 3 g via INTRAVENOUS
  Filled 2014-01-07 (×9): qty 3

## 2014-01-07 MED ORDER — AMIODARONE HCL IN DEXTROSE 360-4.14 MG/200ML-% IV SOLN
60.0000 mg/h | INTRAVENOUS | Status: AC
  Filled 2014-01-07: qty 200

## 2014-01-07 MED ORDER — MAGNESIUM SULFATE 4000MG/100ML IJ SOLN
4.0000 g | Freq: Once | INTRAMUSCULAR | Status: AC
  Administered 2014-01-07: 4 g via INTRAVENOUS
  Filled 2014-01-07: qty 100

## 2014-01-07 MED ORDER — METOPROLOL TARTRATE 1 MG/ML IV SOLN
5.0000 mg | Freq: Once | INTRAVENOUS | Status: AC
  Administered 2014-01-07: 5 mg via INTRAVENOUS
  Filled 2014-01-07: qty 5

## 2014-01-07 NOTE — Progress Notes (Signed)
Nursing note Patient in atrial fib on monitor approx 150-170 bpm,  bp 118/69,  EKG obtained Dr Conley Canal made aware. In to see patient. Will continue to monitor patient.  1050: Metoprolol 19m IV given as ordered. Will continue to monitor patient. Bettina Gavia Alphonse Asbridge RN

## 2014-01-07 NOTE — Progress Notes (Signed)
Clinical Social Work Department CLINICAL SOCIAL WORK PLACEMENT NOTE 01/07/2014  Patient:  [name], [name]  Account Number:  [id number] [place] date:  01/06/2014  Clinical Social Worker:  Megan Salon  Date/time:  01/07/2014 11:45 AM  Clinical Social Work is seeking post-discharge placement for this patient at the following level of care:   [place]   (*CSW will update this form in Epic as items are completed)   01/07/2014  Patient/family provided with [place] Department of Clinical Social Work's list of facilities offering this level of care within the geographic area requested by the patient (or if unable, by the patient's family).  01/07/2014  Patient/family informed of their freedom to choose among providers that offer the needed level of care, that participate in Medicare, Medicaid or managed care program needed by the patient, have an available bed and are willing to accept the patient.  01/07/2014  Patient/family informed of [facility]' ownership interest in [facility], as well as of the fact that they are under no obligation to receive care at this facility.  PASARR submitted to EDS on  PASARR number received on   FL2 transmitted to all facilities in geographic area requested by pt/family on  01/07/2014 FL2 transmitted to all facilities within larger geographic area on   Patient informed that his/her managed care company has contracts with or will negotiate with  certain facilities, including the following:     Patient/family informed of bed offers received:   Patient chooses bed at  Physician recommends and patient chooses bed at    Patient to be transferred to  on   Patient to be transferred to facility by  Patient and family notified of transfer on  Name of family member notified:    The following physician request were entered in Epic:   Additional Comments:  Jeanette Caprice, MSW, [place]

## 2014-01-07 NOTE — Progress Notes (Signed)
ANTIBIOTIC CONSULT NOTE - INITIAL  Pharmacy Consult for Unasyn Indication: Aspiration PNA  Allergies  Allergen Reactions  . Erythromycin Ethylsuccinate     Irregular pulse rate  . Prednisone     "makes me hyper"  . Doxycycline Rash    Patient Measurements: Height: 5' (152.4 cm) Weight: 115 lb 11.9 oz (52.5 kg) IBW/kg (Calculated) : 45.5  Vital Signs: Temp: 98.2 F (36.8 C) (06/09 0455) Temp src: Oral (06/09 0455) BP: 121/78 mmHg (06/09 0503) Pulse Rate: 47 (06/09 0503) Intake/Output from previous day: 06/08 0701 - 06/09 0700 In: -  Out: 280 [Urine:280] Intake/Output from this shift:    Labs:  Recent Labs  01/06/14 1402  WBC 22.4*  HGB 13.4  PLT 463*  CREATININE 0.53   CrCl ~ 31 ml/min  (SCR ~1.0 based on age)  Estimated Creatinine Clearance: 38.9 ml/min (by C-G formula based on Cr of 0.53). No results found for this basename: VANCOTROUGH, Corlis Leak, VANCORANDOM, [place], GENTPEAK, GENTRANDOM, TOBRATROUGH, TOBRAPEAK, TOBRARND, AMIKACINPEAK, AMIKACINTROU, AMIKACIN,  in the last 72 hours   Microbiology: Recent Results (from the past 720 hour(s))  CULTURE, EXPECTORATED SPUTUM-ASSESSMENT     Status: None   Collection Time    01/07/14 10:36 AM      Result Value Ref Range Status   Specimen Description SPUTUM   Final   Special Requests NONE   Final   Sputum evaluation     Final   Value: THIS SPECIMEN IS ACCEPTABLE. RESPIRATORY CULTURE REPORT TO FOLLOW.   Report Status 01/07/2014 FINAL   Final    Medical History: Past Medical History  Diagnosis Date  . GERD (gastroesophageal reflux disease)   . Osteoporosis   . Postmenopausal HRT (hormone replacement therapy)   . Anemia   . Hypertension   . Allergy   . Asthma   . Shortness of breath   . PONV (postoperative nausea and vomiting)   . Dysrhythmia     a fib  . Esophageal cancer     removed esophagus stomach pulled up  . Basal cell carcinoma of face     "several; freezes them off"  . Pneumonia    "all the time; probably 5 times in the last year, counting today" (01/06/2014)  . Chronic bronchitis     "practically q winter" (01/06/2014)  . History of blood transfusion 1993    "related to esophagus removal" (01/06/2014)  . Headache(784.0)     "monthly in the past year" (01/06/2014)  . Arthritis     "legs" (01/06/2014)    Assessment: 80 y.o female with aspiration pneumonia. She received a single dose of levofloxacin.  Now starting Unasyn for better  anaerobic coverage with antibiotic consult per pharmacy to adjust dose for rena function.  SCr = 0.53,  Estimated  CrCl ~ 31 ml/min (used SCr 1.0 for elderly patient)    Goal of Therapy:  Treat infection with appropriateantibiotic dose for renal function.  Plan:  Adjust Unasyn dose to 3 gm IV q8hr.   Nicole Cella, RPh Clinical Pharmacist Pager: [phone] 01/07/2014,11:17 AM

## 2014-01-07 NOTE — Progress Notes (Signed)
Clinical Social Work Department BRIEF PSYCHOSOCIAL ASSESSMENT 01/07/2014  Patient:  [name], [name]     Account Number:  [id number]     Admit date:  01/06/2014  Clinical Social Worker:  Megan Salon  Date/Time:  01/07/2014 11:40 AM  Referred by:  Physician  Date Referred:  01/07/2014 Referred for  SNF Placement   Other Referral:   Interview type:  Other - See comment Other interview type:   CSW spoke to patient and patients family at bedside    PSYCHOSOCIAL DATA Living Status:  ALONE Admitted from facility:   Level of care:   Primary support name:  [name] Primary support relationship to patient:  FAMILY Degree of support available:   Good    CURRENT CONCERNS Current Concerns  Post-Acute Placement   Other Concerns:    SOCIAL WORK ASSESSMENT / PLAN Clinical Social Worker received referral for SNF placement at d/c. CSW introduced self and explained reason for visit. Patient had visitors by bedside. CSW explained SNF process to patient and patient's family. Patient reported she is agreeable for SNF placement but would prefer to go to Inpatient Rehab. Patient's family stated they preferred for patient to go to CIR because of her heart problems. Family stated if CIR can not take patient, they prefer [facility]. CSW encouraged patient and family to think about additional SNF options pending availability of preferred facility. CSW will complete FL2 for MD's signature and will update patient and family when bed offers are received.   Assessment/plan status:  Psychosocial Support/Ongoing Assessment of Needs Other assessment/ plan:   Information/referral to community resources:   CSW information    PATIENT'S/FAMILY'S RESPONSE TO PLAN OF CARE: Patient states she would like rehab, preferably CIR but would like SNF if CIR is not an option.        [name], MSW, [place]

## 2014-01-07 NOTE — Progress Notes (Signed)
UR Completed.  [name] [name] [phone] 01/07/2014  

## 2014-01-07 NOTE — Consult Note (Signed)
Physical Medicine and Rehabilitation Consult  Reason for Consult: Balance disorder Referring Physician: Dr. Conley Canal.    HPI: [name] is a 80 y.o. female with history of A fib, HTN, esophageal cancer s/p esophagectomy with gastric pull thorough, severe GERD, gastroparesis, recurrent aspiration PNA for past 6 months;  who was found by her neighbor in her garden tub on 01/06/14. She reported having tripped and fallen in the tub about 12 hours prior to being found.  Family reported recent cold type symptoms for the past few weeks with decrease in po intake as well as recent d/c of coumadin due to hematemesis. CT head without acute changes. CT cervical spine with mild spinal stenosis with cord flattening and dilated esophagus.  She was started on IV antibiotics for LLL aspiration PNA and therapies ordered. Digoxin discontinued as family question A fib history. She develop A Fib with RVR 150-170 requiring IV metoprolol. Patient with gait disorder and had been referred to outpatient PT but had to stop that due to medical issues. OT evaluation done and CIR recommended due to balance deficits.    Review of Systems  HENT: Negative for ear pain and hearing loss.   Respiratory: Positive for cough (chronic --uses nebs twice a day) and hemoptysis (last pm and today past lunch.). Negative for shortness of breath.   Cardiovascular: Negative for chest pain and palpitations.  Gastrointestinal: Positive for heartburn, nausea and vomiting. Negative for abdominal pain.  Genitourinary: Negative for urgency and frequency.  Musculoskeletal: Positive for back pain, myalgias and neck pain.  Neurological: Positive for dizziness (just recently) and headaches.  Psychiatric/Behavioral: The patient has insomnia.     Past Medical History  Diagnosis Date  . GERD (gastroesophageal reflux disease)   . Osteoporosis   . Postmenopausal HRT (hormone replacement therapy)   . Anemia   . Hypertension   .  Allergy   . Asthma   . Shortness of breath   . PONV (postoperative nausea and vomiting)   . Dysrhythmia     a fib  . Esophageal cancer     removed esophagus stomach pulled up  . Basal cell carcinoma of face     "several; freezes them off"  . Pneumonia     "all the time; probably 5 times in the last year, counting today" (01/06/2014)  . Chronic bronchitis     "practically q winter" (01/06/2014)  . History of blood transfusion 1993    "related to esophagus removal" (01/06/2014)  . Headache(784.0)     "monthly in the past year" (01/06/2014)  . Arthritis     "legs" (01/06/2014)   Past Surgical History  Procedure Laterality Date  . Left oophorectomy  1952  . Esophageal removal of cancer,gastric ppull-thru 1993    . Forearm fracture surgery Right ?1990's  . Esophagogastroduodenoscopy  10/19/2011    Procedure: ESOPHAGOGASTRODUODENOSCOPY (EGD);  Surgeon: Milus Banister, MD;  Location: [facility];  Service: Endoscopy;  Laterality: N/A;  . Balloon dilation  10/19/2011    Procedure: BALLOON DILATION;  Surgeon: Milus Banister, MD;  Location: [facility];  Service: Endoscopy;  Laterality: N/A;  . Esophagogastroduodenoscopy  11/07/2011    Procedure: ESOPHAGOGASTRODUODENOSCOPY (EGD);  Surgeon: Ladene Artist, MD,FACG;  Location: [facility]COPY;  Service: Endoscopy;  Laterality: N/A;  . Esophagogastroduodenoscopy  11/22/2011    Procedure: ESOPHAGOGASTRODUODENOSCOPY (EGD);  Surgeon: Inda Castle, MD;  Location: [facility];  Service: Endoscopy;  Laterality: N/A;  . Esophagogastroduodenoscopy  11/24/2011    Procedure:  ESOPHAGOGASTRODUODENOSCOPY (EGD);  Surgeon: Inda Castle, MD;  Location: [facility];  Service: Endoscopy;  Laterality: N/A;  with removable duodenal stent (actually using esophageal partially covered 23X15 located in locked supply room  . Duodenal stent placement  11/24/2011    Procedure: DUODENAL STENT PLACEMENT;  Surgeon: Inda Castle, MD;  Location: [facility];  Service:  Endoscopy;  Laterality: N/A;  . Video bronchoscopy Bilateral 01/18/2013    Procedure: VIDEO BRONCHOSCOPY WITHOUT FLUORO;  Surgeon: Tanda Rockers, MD;  Location: [facility]COPY;  Service: Cardiopulmonary;  Laterality: Bilateral;  . Esophagogastroduodenoscopy N/A 02/11/2013    Procedure: ESOPHAGOGASTRODUODENOSCOPY (EGD);  Surgeon: Inda Castle, MD;  Location: [facility];  Service: Endoscopy;  Laterality: N/A;  . Duodenal stent placement N/A 02/11/2013    Procedure: DUODENAL STENT PLACEMENT;  Surgeon: Inda Castle, MD;  Location: [facility];  Service: Endoscopy;  Laterality: N/A;  . Esophagogastroduodenoscopy N/A 09/11/2013    Procedure: ESOPHAGOGASTRODUODENOSCOPY (EGD);  Surgeon: Gatha Mayer, MD;  Location: [facility];  Service: Endoscopy;  Laterality: N/A;  . Cholecystectomy  ~ 1996  . Dilation and curettage of uterus  X 6-7  . Cataract extraction w/ intraocular lens  implant, bilateral Bilateral ~ 2000   Family History  Problem Relation Age of Onset  . Cervical cancer Mother   . Heart disease Father     MI  . Coronary artery disease Brother   . Hypotension Sister   . Colon cancer Neg Hx   . Colon polyps Sister   . Pancreatic cancer      1/2 sister  . Coronary artery disease Sister     pacemaker  . Asthma Brother   . Asthma Sister    Social History:  Divorced. 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 Has two nieces who check in occasionally. She  reports that she has never smoked. She has never used smokeless tobacco. She reports that she does not drink alcohol or use illicit drugs.   Allergies  Allergen Reactions  . Erythromycin Ethylsuccinate     Irregular pulse rate  . Prednisone     "makes me hyper"  . Doxycycline Rash    Medications Prior to Admission  Medication Sig Dispense Refill  . acetaminophen (TYLENOL) 500 MG tablet Take 1 tablet (500 mg total) by mouth every 6 (six) hours as needed for headache.  30 tablet    . albuterol  (PROVENTIL HFA;VENTOLIN HFA) 108 (90 BASE) MCG/ACT inhaler Inhale 2 puffs into the lungs every 6 (six) hours as needed. For wheezing  1 Inhaler  4  . albuterol (PROVENTIL) (2.5 MG/3ML) 0.083% nebulizer solution Take 3 mLs (2.5 mg total) by nebulization every 4 (four) hours as needed for wheezing or shortness of breath.  75 mL  12  . arformoterol (BROVANA) 15 MCG/2ML NEBU Take 2 mLs (15 mcg total) by nebulization 2 (two) times daily. Dx: 493.00  120 mL    . budesonide (PULMICORT) 0.25 MG/2ML nebulizer solution Take 2 mLs (0.25 mg total) by nebulization 2 (two) times daily. Dx: 493.00  60 mL    . chlorpheniramine-HYDROcodone (TUSSIONEX PENNKINETIC ER) 10-8 MG/5ML LQCR Take 5 mLs by mouth every 12 (twelve) hours as needed for cough.  480 mL  0  . dextromethorphan-guaiFENesin (MUCINEX DM) 30-600 MG per 12 hr tablet Take 0.5 tablets by mouth 2 (two) times daily.      . digoxin (LANOXIN) 0.125 MG tablet Take 1 tablet (0.125 mg total) by mouth daily.  90 tablet  3  . Fe Fum-FA-B Cmp-C-Zn-Mg-Mn-Cu (HEMOCYTE PLUS) 106-1 MG CAPS Take 1 tablet by mouth 2 (two) times daily.      . furosemide (LASIX) 40 MG tablet Take 1 tablet (40 mg total) by mouth daily.  30 tablet  0  . lansoprazole (PREVACID SOLUTAB) 30 MG disintegrating tablet Take 2 tablets (60 mg total) by mouth 2 (two) times daily.  360 tablet  3  . Methylcobalamin (B-12) 5000 MCG TBDP Take 1 tablet by mouth daily.      . metoCLOPramide (REGLAN) 5 MG tablet Take 1 tablet (5 mg total) by mouth 2 (two) times daily.  180 tablet  3  . Multiple Vitamins-Calcium (VIACTIV MULTI-VITAMIN) CHEW Chew 1 each by mouth daily.    0  . Olopatadine HCl (PATADAY) 0.2 % SOLN Apply 1-2 drops to eye 2 (two) times daily as needed (dry, itchy eyes.).      Marland Kitchen promethazine (PHENERGAN) 25 MG suppository Place 25 mg rectally every 6 (six) hours as needed for nausea or vomiting.      . sucralfate (CARAFATE) 1 GM/10ML suspension Take 1 g by mouth 2 (two) times daily.      . traMADol  (ULTRAM) 50 MG tablet Take 1 tablet (50 mg total) by mouth every 8 (eight) hours as needed for moderate pain.  30 tablet  0  . Vitamin D, Ergocalciferol, (DRISDOL) 50000 UNITS CAPS capsule Take 1 capsule (50,000 Units total) by mouth every 7 (seven) days. On Friday  30 capsule  3  . zolpidem (AMBIEN) 5 MG tablet Take 5 mg by mouth at bedtime.        Home: Home Living Family/patient expects to be discharged to:: Private residence Living Arrangements: Alone Available Help at Discharge: Available PRN/intermittently;Other (Comment) (Neighbor/family) Type of Home: House Home Access: Stairs to enter [employer] of Steps: 5 STE Entrance Stairs-Rails: Right Home Layout: One level Home Equipment: Walker - 2 wheels;Shower seat - built in;Grab bars - toilet;Grab bars - tub/shower;Hand held shower head  Functional History: Prior Function Level of Independence: Independent with assistive device(s) Comments: sleeps in recliner at home. Pt drives. Pt also reports that she had assistance at home 2x/week for 4 hours until recently "She didn't do, much, I can do everything myself, I didn't need her any more." Functional Status:  Mobility: Bed Mobility Overal bed mobility: Needs Assistance Bed Mobility: Supine to Sit Supine to sit: Min assist General bed mobility comments: Min A and vc's for safety Transfers Overall transfer level: Needs assistance Equipment used: 1 person hand held assist Transfers: Sit to/from [facility] Sit to Stand: Mod assist Stand pivot transfers: Min assist General transfer comment:  (Pt with posterior lean with initial stand from 3:1, EOB and recliner noted.)      ADL: ADL Overall ADL's : Needs assistance/impaired Grooming: Wash/dry hands;Wash/dry face;Set up;Sitting Upper Body Bathing: Set up;Sitting Lower Body Bathing: Moderate assistance;Sit to/from stand Lower Body Dressing: Set up;Moderate assistance;Sit to/from stand Toilet Transfer:  Moderate assistance;BSC;Stand-pivot;Ambulation;Cueing for safety;Cueing for sequencing Toilet Transfer Details (indicate cue type and reason): Pt transferred to 3:1 x2 during this therapy session, pt demonstrates posterior & left lateral lean with sit to stand noted and requires physical assist to correct. Toileting- Clothing Manipulation and Hygiene: Minimal assistance;Sitting/lateral lean Functional mobility during ADLs: Minimal assistance;Moderate assistance;Cueing for safety;Cueing for sequencing General ADL Comments: Pt was educated in role of OT and participated in ADL retraining session after assessment today. Pt appears to fatigue from coughing during minimal activity. Pt currently  requires physical assist for all ADL's and transfers, rec CIR consult/rehab secondary to lives alone & was independent PTA. Pt was sitting up in chair, w/ call bell/phone in reach, eating breakfast and RN in room at conclusion of session (Pt was educated in role of OT and participated in ADL retraining session after assessment today. Pt appears to fatigue from coughing during minimal activity. Pt currently requires physical assist for all ADL's and transfers, rec CIR consult/rehab seconda)  Cognition: Cognition Overall Cognitive Status: Within Functional Limits for tasks assessed Orientation Level: Oriented X4 Cognition Arousal/Alertness: Awake/alert Behavior During Therapy: WFL for tasks assessed/performed Overall Cognitive Status: Within Functional Limits for tasks assessed  Blood pressure 121/78, pulse 47, temperature 98.2 F (36.8 C), temperature source Oral, resp. rate 19, height 5' (1.524 m), weight 52.5 kg (115 lb 11.9 oz), SpO2 92.00%. Physical Exam  Nursing note and vitals reviewed. Constitutional: She is oriented to person, place, and time. She appears well-developed.  HENT:  Head: Normocephalic and atraumatic.  Eyes: Conjunctivae are normal. Pupils are equal, round, and reactive to light.  Neck:    Decreased ROM noted.   Cardiovascular: Normal rate and regular rhythm.   Respiratory: Effort normal. No respiratory distress. She has decreased breath sounds. She has no wheezes.  Intermittent congested cough during exam.   GI: Soft. Bowel sounds are normal. She exhibits no distension. There is no tenderness.  Neurological: She is alert and oriented to person, place, and time. No cranial nerve deficit. She exhibits normal muscle tone. Coordination normal.  Moves all 4's. Has general weakness. Proximal greater than distal. No gross sensory abnl.   Skin: Skin is warm and dry.  Psychiatric: She has a normal mood and affect. Her behavior is normal. Thought content normal.    Results for orders placed during the hospital encounter of 01/06/14 (from the past 24 hour(s))  CBC WITH DIFFERENTIAL     Status: Abnormal   Collection Time    01/06/14  2:02 PM      Result Value Ref Range   WBC 22.4 (*) 4.0 - 10.5 K/uL   RBC 5.32 (*) 3.87 - 5.11 MIL/uL   Hemoglobin 13.4  12.0 - 15.0 g/dL   HCT 41.5  36.0 - 46.0 %   MCV 78.0  78.0 - 100.0 fL   MCH 25.2 (*) 26.0 - 34.0 pg   MCHC 32.3  30.0 - 36.0 g/dL   RDW 15.4  11.5 - 15.5 %   Platelets 463 (*) 150 - 400 K/uL   Neutrophils Relative % 89 (*) 43 - 77 %   Neutro Abs 20.0 (*) 1.7 - 7.7 K/uL   Lymphocytes Relative 4 (*) 12 - 46 %   Lymphs Abs 0.9  0.7 - 4.0 K/uL   Monocytes Relative 7  3 - 12 %   Monocytes Absolute 1.5 (*) 0.1 - 1.0 K/uL   Eosinophils Relative 0  0 - 5 %   Eosinophils Absolute 0.0  0.0 - 0.7 K/uL   Basophils Relative 0  0 - 1 %   Basophils Absolute 0.0  0.0 - 0.1 K/uL  COMPREHENSIVE METABOLIC PANEL     Status: Abnormal   Collection Time    01/06/14  2:02 PM      Result Value Ref Range   Sodium 138  137 - 147 mEq/L   Potassium 3.9  3.7 - 5.3 mEq/L   Chloride 98  96 - 112 mEq/L   CO2 25  19 - 32 mEq/L   Glucose,  Bld 143 (*) 70 - 99 mg/dL   BUN 13  6 - 23 mg/dL   Creatinine, Ser 0.53  0.50 - 1.10 mg/dL   Calcium 9.1  8.4 -  10.5 mg/dL   Total Protein 5.9 (*) 6.0 - 8.3 g/dL   Albumin 2.6 (*) 3.5 - 5.2 g/dL   AST 41 (*) 0 - 37 U/L   ALT 28  0 - 35 U/L   Alkaline Phosphatase 105  39 - 117 U/L   Total Bilirubin 0.9  0.3 - 1.2 mg/dL   GFR calc non Af Amer 86 (*) >90 mL/min   GFR calc Af Amer >90  >90 mL/min  CK     Status: Abnormal   Collection Time    01/06/14  2:02 PM      Result Value Ref Range   Total CK 470 (*) 7 - 177 U/L  TROPONIN I     Status: None   Collection Time    01/06/14  2:02 PM      Result Value Ref Range   Troponin I <0.30  <0.30 ng/mL  I-STAT CG4 LACTIC ACID, ED     Status: None   Collection Time    01/06/14  2:40 PM      Result Value Ref Range   Lactic Acid, Venous 1.26  0.5 - 2.2 mmol/L  LEGIONELLA ANTIGEN, URINE     Status: None   Collection Time    01/06/14  8:11 PM      Result Value Ref Range   Specimen Description URINE, CLEAN CATCH     Special Requests NONE     Legionella Antigen, Urine       Value: Negative for Legionella pneumophilia serogroup 1     Performed at [facility]   Report Status 01/07/2014 FINAL    STREP PNEUMONIAE URINARY ANTIGEN     Status: None   Collection Time    01/06/14  8:11 PM      Result Value Ref Range   Strep Pneumo Urinary Antigen NEGATIVE  NEGATIVE  GLUCOSE, CAPILLARY     Status: Abnormal   Collection Time    01/06/14  9:39 PM      Result Value Ref Range   Glucose-Capillary 105 (*) 70 - 99 mg/dL   Comment 1 Documented in Chart     Comment 2 Notify RN    GLUCOSE, CAPILLARY     Status: Abnormal   Collection Time    01/07/14  6:22 AM      Result Value Ref Range   Glucose-Capillary 117 (*) 70 - 99 mg/dL  CULTURE, EXPECTORATED SPUTUM-ASSESSMENT     Status: None   Collection Time    01/07/14 10:36 AM      Result Value Ref Range   Specimen Description SPUTUM     Special Requests NONE     Sputum evaluation       Value: THIS SPECIMEN IS ACCEPTABLE. RESPIRATORY CULTURE REPORT TO FOLLOW.   Report Status 01/07/2014 FINAL    GLUCOSE,  CAPILLARY     Status: Abnormal   Collection Time    01/07/14 11:42 AM      Result Value Ref Range   Glucose-Capillary 129 (*) 70 - 99 mg/dL  BASIC METABOLIC PANEL     Status: Abnormal   Collection Time    01/07/14 11:44 AM      Result Value Ref Range   Sodium 136 (*) 137 - 147 mEq/L   Potassium 3.5 (*) 3.7 -  5.3 mEq/L   Chloride 100  96 - 112 mEq/L   CO2 25  19 - 32 mEq/L   Glucose, Bld 140 (*) 70 - 99 mg/dL   BUN 12  6 - 23 mg/dL   Creatinine, Ser 0.65  0.50 - 1.10 mg/dL   Calcium 7.9 (*) 8.4 - 10.5 mg/dL   GFR calc non Af Amer 80 (*) >90 mL/min   GFR calc Af Amer >90  >90 mL/min  MAGNESIUM     Status: None   Collection Time    01/07/14 11:44 AM      Result Value Ref Range   Magnesium 1.5  1.5 - 2.5 mg/dL  CK     Status: Abnormal   Collection Time    01/07/14 11:44 AM      Result Value Ref Range   Total CK 206 (*) 7 - 177 U/L  CBC WITH DIFFERENTIAL     Status: Abnormal   Collection Time    01/07/14 11:44 AM      Result Value Ref Range   WBC 15.6 (*) 4.0 - 10.5 K/uL   RBC 4.58  3.87 - 5.11 MIL/uL   Hemoglobin 11.4 (*) 12.0 - 15.0 g/dL   HCT 35.7 (*) 36.0 - 46.0 %   MCV 77.9 (*) 78.0 - 100.0 fL   MCH 24.9 (*) 26.0 - 34.0 pg   MCHC 31.9  30.0 - 36.0 g/dL   RDW 15.4  11.5 - 15.5 %   Platelets 426 (*) 150 - 400 K/uL   Neutrophils Relative % 81 (*) 43 - 77 %   Neutro Abs 12.7 (*) 1.7 - 7.7 K/uL   Lymphocytes Relative 7 (*) 12 - 46 %   Lymphs Abs 1.0  0.7 - 4.0 K/uL   Monocytes Relative 12  3 - 12 %   Monocytes Absolute 1.9 (*) 0.1 - 1.0 K/uL   Eosinophils Relative 0  0 - 5 %   Eosinophils Absolute 0.0  0.0 - 0.7 K/uL   Basophils Relative 0  0 - 1 %   Basophils Absolute 0.0  0.0 - 0.1 K/uL   Dg Chest 2 View  01/06/2014   CLINICAL DATA:  Fall with pain in the mid to lower back.  EXAM: CHEST  2 VIEW  COMPARISON:  12/20/2013.  CT chest 09/12/2013.  FINDINGS: Surgical clips in the expected location of the thyroid. Trachea is midline. Heart is enlarged. Thoracic aorta is  calcified. A long wall stent projects over the lower thoracic spine and traverses a gastric pull-through when compared with 09/12/2013. Patchy left basilar airspace disease. Small left pleural effusion. Osteopenia without definite compression fracture.  IMPRESSION: 1. Patchy left basilar airspace disease may be due to pneumonia. 2. Small left pleural effusion. 3. Osteopenia without definite fracture.   Electronically Signed   By: Lorin Picket M.D.   On: 01/06/2014 13:36   Dg Thoracic Spine 2 View  01/06/2014   CLINICAL DATA:  Fall.  Back pain.  EXAM: THORACIC SPINE - 2 VIEW  COMPARISON:  Lateral chest radiograph 12/20/2013  FINDINGS: No fracture. No spondylolisthesis. There are mild disc degenerative changes along the mid lower thoracic spine. Bones are diffusely demineralized. Vena cava stent extends from the mid chest to the central upper abdomen.  IMPRESSION: No fracture or acute finding.   Electronically Signed   By: Lajean Manes M.D.   On: 01/06/2014 13:35   Dg Lumbar Spine Complete  01/06/2014   CLINICAL DATA:  80 year old female status post  fall with pain. Initial encounter. History of esophagectomy with gastric pull-through and stenting  EXAM: LUMBAR SPINE - COMPLETE 4+ VIEW  COMPARISON:  Abdominal 11/17/2011.  Chest CT 09/12/2013.  FINDINGS: Metallic stents in the distal stomach and right upper quadrant Stable from the recent CT. Stable right upper quadrant surgical clips. Multiple surgical clips projecting at the mediastinum and left lung base re- identified. Transitional lumbosacral anatomy, suspect L5 level being mostly sacralized. Anterolisthesis of L4 on L5 measuring up to 9 mm. Moderate to severe lower lumbar facet hypertrophy and degeneration. Relatively preserved disc spaces. Elsewhere lumbar vertebral height and alignment within normal limits. Grossly intact visualized lower thoracic levels. Sacral ala and SI joints within normal limits. Sclerosis at the pubic symphysis probably is  degenerative.  IMPRESSION: 1. Transitional lumbosacral anatomy suspected with sacralized L5 level. Grade 1-2 anterolisthesis of L4 on L5 with advanced facet degeneration. 2.  No acute osseous abnormality identified in the lumbar spine. 3. Postoperative changes to the mediastinum with gastric pull-through metal stent extending into the right upper quadrant.   Electronically Signed   By: Lars Pinks M.D.   On: 01/06/2014 13:39   Ct Head Wo Contrast  01/06/2014   CLINICAL DATA:  Found down.  Fell in bathtub.  EXAM: CT HEAD WITHOUT CONTRAST  CT CERVICAL SPINE WITHOUT CONTRAST  TECHNIQUE: Multidetector CT imaging of the head and cervical spine was performed following the standard protocol without intravenous contrast. Multiplanar CT image reconstructions of the cervical spine were also generated.  COMPARISON:  01/15/2013 head CT. Scout view of the paranasal sinus CT 01/15/2013. Brain MR 08/17/2004.  FINDINGS: CT HEAD FINDINGS  No skull fracture or intracranial hemorrhage. Small vessel disease type changes without CT evidence of large acute infarct. Partial opacification right sphenoid sinus air cell and upper aspect of the right pterygoid plate containing hyperdense material which may represent inspissated material. No intracranial mass lesion noted on this unenhanced exam. No hydrocephalus.  CT CERVICAL SPINE FINDINGS  No cervical spine fracture noted.  Minimal anterior slip of C3 and C4 felt to be related to facet joint degenerative changes.  Minimal anterior widening of the posterior aspect of the left C5-6 and C6-7 facet joint. This may be normal for this patient rather than related to injury however if there was a high clinical suspicion of ligamentous injury, MR or flexion and extension views could be obtained for further delineation.  C5-6 and C6-7 disc degeneration with broad-based spur causing mild spinal stenosis and cord flattening.  No abnormal prevertebral soft tissue swelling.  Dilated esophagus. Patient  has an esophageal stent in place and this may be related to treatment of tumor although incompletely assessed.  IMPRESSION:  CT head:  No skull fracture or intracranial hemorrhage.  Small vessel disease type changes without CT evidence of large acute infarct.  Partial opacification right sphenoid sinus air cell and upper aspect of the right pterygoid plate containing hyperdense material which may represent inspissated material.  CT cervical spine:  No cervical spine fracture noted.  Minimal anterior slip of C3 and C4 felt to be related to facet joint degenerative changes.  Minimal anterior widening of the posterior aspect of the left C5-6 and C6-7 facet joint. This may be normal for this patient rather than related to injury however if there was a high clinical suspicion of ligamentous injury, MR or flexion and extension views could be obtained for further delineation.  C5-6 and C6-7 disc degeneration with broad-based spur causing mild spinal stenosis and cord  flattening.  No abnormal prevertebral soft tissue swelling.  Dilated esophagus. Patient has an esophageal stent in place and this may be related to treatment of tumor although incompletely assessed.   Electronically Signed   By: Chauncey Cruel M.D.   On: 01/06/2014 14:05   Ct Cervical Spine Wo Contrast  01/06/2014   CLINICAL DATA:  Found down.  Fell in bathtub.  EXAM: CT HEAD WITHOUT CONTRAST  CT CERVICAL SPINE WITHOUT CONTRAST  TECHNIQUE: Multidetector CT imaging of the head and cervical spine was performed following the standard protocol without intravenous contrast. Multiplanar CT image reconstructions of the cervical spine were also generated.  COMPARISON:  01/15/2013 head CT. Scout view of the paranasal sinus CT 01/15/2013. Brain MR 08/17/2004.  FINDINGS: CT HEAD FINDINGS  No skull fracture or intracranial hemorrhage. Small vessel disease type changes without CT evidence of large acute infarct. Partial opacification right sphenoid sinus air cell and upper  aspect of the right pterygoid plate containing hyperdense material which may represent inspissated material. No intracranial mass lesion noted on this unenhanced exam. No hydrocephalus.  CT CERVICAL SPINE FINDINGS  No cervical spine fracture noted.  Minimal anterior slip of C3 and C4 felt to be related to facet joint degenerative changes.  Minimal anterior widening of the posterior aspect of the left C5-6 and C6-7 facet joint. This may be normal for this patient rather than related to injury however if there was a high clinical suspicion of ligamentous injury, MR or flexion and extension views could be obtained for further delineation.  C5-6 and C6-7 disc degeneration with broad-based spur causing mild spinal stenosis and cord flattening.  No abnormal prevertebral soft tissue swelling.  Dilated esophagus. Patient has an esophageal stent in place and this may be related to treatment of tumor although incompletely assessed.  IMPRESSION:  CT head:  No skull fracture or intracranial hemorrhage.  Small vessel disease type changes without CT evidence of large acute infarct.  Partial opacification right sphenoid sinus air cell and upper aspect of the right pterygoid plate containing hyperdense material which may represent inspissated material.  CT cervical spine:  No cervical spine fracture noted.  Minimal anterior slip of C3 and C4 felt to be related to facet joint degenerative changes.  Minimal anterior widening of the posterior aspect of the left C5-6 and C6-7 facet joint. This may be normal for this patient rather than related to injury however if there was a high clinical suspicion of ligamentous injury, MR or flexion and extension views could be obtained for further delineation.  C5-6 and C6-7 disc degeneration with broad-based spur causing mild spinal stenosis and cord flattening.  No abnormal prevertebral soft tissue swelling.  Dilated esophagus. Patient has an esophageal stent in place and this may be related to  treatment of tumor although incompletely assessed.   Electronically Signed   By: Chauncey Cruel M.D.   On: 01/06/2014 14:05    Assessment/Plan: Diagnosis: deconditioning after pneumonia, and  multiple medical issues. Recent fall with trunk,soft tissue trauma 1. Does the need for close, 24 hr/day medical supervision in concert with the patient's rehab needs make it unreasonable for this patient to be served in a less intensive setting? Yes 2. Co-Morbidities requiring supervision/potential complications: afib, chf, htn 3. Due to bladder management, bowel management, safety, skin/wound care, disease management, medication administration, pain management and patient education, does the patient require 24 hr/day rehab nursing? Yes 4. Does the patient require coordinated care of a physician, rehab nurse, PT (1-2 hrs/day,  5 days/week) and OT (1-2 hrs/day, 5 days/week) to address physical and functional deficits in the context of the above medical diagnosis(es)? Yes Addressing deficits in the following areas: balance, endurance, locomotion, strength, transferring, bowel/bladder control, bathing, dressing, feeding, grooming, toileting and psychosocial support 5. Can the patient actively participate in an intensive therapy program of at least 3 hrs of therapy per day at least 5 days per week? Yes 6. The potential for patient to make measurable gains while on inpatient rehab is excellent 7. Anticipated functional outcomes upon discharge from inpatient rehab are modified independent and supervision  with PT, modified independent and supervision with OT, n/a with SLP. 8. Estimated rehab length of stay to reach the above functional goals is: 8-12 days 9. Does the patient have adequate social supports to accommodate these discharge functional goals? Yes 10. Anticipated D/C setting: Home 11. Anticipated post D/C treatments: Marie therapy 12. Overall Rehab/Functional Prognosis: excellent  RECOMMENDATIONS: This  patient's condition is appropriate for continued rehabilitative care in the following setting: CIR Patient has agreed to participate in recommended program. Yes Note that insurance prior authorization may be required for reimbursement for recommended care.  Comment: Pt has family and hired help who can potentially assist. Rehab Admissions Coordinator to follow up.  Thanks,  Meredith Staggers, MD, Mellody Drown     01/07/2014

## 2014-01-07 NOTE — Progress Notes (Addendum)
Chart reviewed. Called to bedside by RN for atrial fibrillation rapid ventricular response with a rate of about 150.   [facility] PROGRESS NOTE  [name]:[id number] DOB: 02-Jun-1929 DOA: 01/06/2014 PCP: Donia Ast, FNP  Assessment/Plan:  Principal Problem:   Fall at home: no focal deficits, but as patient back in atrial fibrillation, it is reasonable to do an MRI brain. Hold off on MRA for now. Occupational therapy is recommending CIR. Will consult. If not, SNF would be appropriate. Patient and niece agreeable to both options. Active Problems:   Atrial fibrillation with rapid ventricular response:  Patient was in the midst of getting an albuterol treatment. WIll change to Xopenex as needed. Resume digoxin and give a dose of IV metoprolol. Not a Coumadin candidate due to falls and recurrent hemoptysis. Patient is followed by Dr. Johnsie Cancel. If remains tachycardic, may need cardiology consult, but will hold off for now. Will repeat labs this morning to rule out electrolyte disturbance.  This may have been that etiology of fall.   Just had an echo a few months ago. No definite indication that this was a primary neurologic event. Cancel echo for now.   Aspiration pneumonia:  Received a single dose of levofloxacin. Did not have orders for further antibiotics. We'll give Unasyn for better anaerobic coverage.   ANEMIA-IRON DEFICIENCY   HYPERTENSION   HX OF ESOPHAGEAL CANCER   Esophageal stricture   Aortic regurgitation   Hx of esophagectomy   Unspecified asthma(493.90)   Chronic diastolic CHF (congestive heart failure), compensated    Pulmonary hypertension   Code Status:  DO NOT RESUSCITATE  Family Communication:   Niece at bedside  Disposition Plan:  inpatient rehabilitation versus skilled nursing facility   Consultants:    Procedures:     Antibiotics:   levofloxacin 01/06/2014  HEENT SN 01/07/2014   HPI/Subjective:  denies palpitations. Denies chest  pain or shortness of breath. Has had blood-tinged sputum overnight. Clear to yellow this morning.  Objective: Filed Vitals:   01/07/14 0503  BP: 121/78  Pulse: 47  Temp:   Resp: 19    Intake/Output Summary (Last 24 hours) at 01/07/14 1103 Last data filed at 01/07/14 0504  Gross per 24 hour  Intake      0 ml  Output    280 ml  Net   -280 ml   Filed Weights   01/06/14 1824  Weight: 52.5 kg (115 lb 11.9 oz)    telemetry: Atrial fibrillation rate 150.  Exam:   General:   elderly frail. In oriented and appropriate.   Cardiovascular:  irregularly irregular. Facets. No murmurs gallops rubs appreciated.   Respiratory:  diminished throughout without wheezes rhonchi or rales   Abdomen:  soft nontender nondistended   Ext:  no edema    neurologic: Nonfocal.  Basic Metabolic Panel:  Recent Labs Lab 01/06/14 1402  NA 138  K 3.9  CL 98  CO2 25  GLUCOSE 143*  BUN 13  CREATININE 0.53  CALCIUM 9.1   Liver Function Tests:  Recent Labs Lab 01/06/14 1402  AST 41*  ALT 28  ALKPHOS 105  BILITOT 0.9  PROT 5.9*  ALBUMIN 2.6*   No results found for this basename: LIPASE, AMYLASE,  in the last 168 hours No results found for this basename: AMMONIA,  in the last 168 hours CBC:  Recent Labs Lab 01/06/14 1402  WBC 22.4*  NEUTROABS 20.0*  HGB 13.4  HCT 41.5  MCV 78.0  PLT 463*  Cardiac Enzymes:  Recent Labs Lab 01/06/14 1402  CKTOTAL 470*  TROPONINI <0.30   BNP (last 3 results)  Recent Labs  02/25/13 1108  PROBNP 185.0*   CBG:  Recent Labs Lab 01/06/14 2139 01/07/14 0622  GLUCAP 105* 117*    No results found for this or any previous visit (from the past 240 hour(s)).   Studies: Dg Chest 2 View  01/06/2014   CLINICAL DATA:  Fall with pain in the mid to lower back.  EXAM: CHEST  2 VIEW  COMPARISON:  12/20/2013.  CT chest 09/12/2013.  FINDINGS: Surgical clips in the expected location of the thyroid. Trachea is midline. Heart is enlarged. Thoracic  aorta is calcified. A long wall stent projects over the lower thoracic spine and traverses a gastric pull-through when compared with 09/12/2013. Patchy left basilar airspace disease. Small left pleural effusion. Osteopenia without definite compression fracture.  IMPRESSION: 1. Patchy left basilar airspace disease may be due to pneumonia. 2. Small left pleural effusion. 3. Osteopenia without definite fracture.   Electronically Signed   By: Lorin Picket M.D.   On: 01/06/2014 13:36   Dg Thoracic Spine 2 View  01/06/2014   CLINICAL DATA:  Fall.  Back pain.  EXAM: THORACIC SPINE - 2 VIEW  COMPARISON:  Lateral chest radiograph 12/20/2013  FINDINGS: No fracture. No spondylolisthesis. There are mild disc degenerative changes along the mid lower thoracic spine. Bones are diffusely demineralized. Vena cava stent extends from the mid chest to the central upper abdomen.  IMPRESSION: No fracture or acute finding.   Electronically Signed   By: Lajean Manes M.D.   On: 01/06/2014 13:35   Dg Lumbar Spine Complete  01/06/2014   CLINICAL DATA:  80 year old female status post fall with pain. Initial encounter. History of esophagectomy with gastric pull-through and stenting  EXAM: LUMBAR SPINE - COMPLETE 4+ VIEW  COMPARISON:  Abdominal 11/17/2011.  Chest CT 09/12/2013.  FINDINGS: Metallic stents in the distal stomach and right upper quadrant Stable from the recent CT. Stable right upper quadrant surgical clips. Multiple surgical clips projecting at the mediastinum and left lung base re- identified. Transitional lumbosacral anatomy, suspect L5 level being mostly sacralized. Anterolisthesis of L4 on L5 measuring up to 9 mm. Moderate to severe lower lumbar facet hypertrophy and degeneration. Relatively preserved disc spaces. Elsewhere lumbar vertebral height and alignment within normal limits. Grossly intact visualized lower thoracic levels. Sacral ala and SI joints within normal limits. Sclerosis at the pubic symphysis probably is  degenerative.  IMPRESSION: 1. Transitional lumbosacral anatomy suspected with sacralized L5 level. Grade 1-2 anterolisthesis of L4 on L5 with advanced facet degeneration. 2.  No acute osseous abnormality identified in the lumbar spine. 3. Postoperative changes to the mediastinum with gastric pull-through metal stent extending into the right upper quadrant.   Electronically Signed   By: Lars Pinks M.D.   On: 01/06/2014 13:39   Ct Head Wo Contrast  01/06/2014   CLINICAL DATA:  Found down.  Fell in bathtub.  EXAM: CT HEAD WITHOUT CONTRAST  CT CERVICAL SPINE WITHOUT CONTRAST  TECHNIQUE: Multidetector CT imaging of the head and cervical spine was performed following the standard protocol without intravenous contrast. Multiplanar CT image reconstructions of the cervical spine were also generated.  COMPARISON:  01/15/2013 head CT. Scout view of the paranasal sinus CT 01/15/2013. Brain MR 08/17/2004.  FINDINGS: CT HEAD FINDINGS  No skull fracture or intracranial hemorrhage. Small vessel disease type changes without CT evidence of large acute infarct. Partial opacification  right sphenoid sinus air cell and upper aspect of the right pterygoid plate containing hyperdense material which may represent inspissated material. No intracranial mass lesion noted on this unenhanced exam. No hydrocephalus.  CT CERVICAL SPINE FINDINGS  No cervical spine fracture noted.  Minimal anterior slip of C3 and C4 felt to be related to facet joint degenerative changes.  Minimal anterior widening of the posterior aspect of the left C5-6 and C6-7 facet joint. This may be normal for this patient rather than related to injury however if there was a high clinical suspicion of ligamentous injury, MR or flexion and extension views could be obtained for further delineation.  C5-6 and C6-7 disc degeneration with broad-based spur causing mild spinal stenosis and cord flattening.  No abnormal prevertebral soft tissue swelling.  Dilated esophagus. Patient  has an esophageal stent in place and this may be related to treatment of tumor although incompletely assessed.  IMPRESSION:  CT head:  No skull fracture or intracranial hemorrhage.  Small vessel disease type changes without CT evidence of large acute infarct.  Partial opacification right sphenoid sinus air cell and upper aspect of the right pterygoid plate containing hyperdense material which may represent inspissated material.  CT cervical spine:  No cervical spine fracture noted.  Minimal anterior slip of C3 and C4 felt to be related to facet joint degenerative changes.  Minimal anterior widening of the posterior aspect of the left C5-6 and C6-7 facet joint. This may be normal for this patient rather than related to injury however if there was a high clinical suspicion of ligamentous injury, MR or flexion and extension views could be obtained for further delineation.  C5-6 and C6-7 disc degeneration with broad-based spur causing mild spinal stenosis and cord flattening.  No abnormal prevertebral soft tissue swelling.  Dilated esophagus. Patient has an esophageal stent in place and this may be related to treatment of tumor although incompletely assessed.   Electronically Signed   By: Chauncey Cruel M.D.   On: 01/06/2014 14:05   Ct Cervical Spine Wo Contrast  01/06/2014   CLINICAL DATA:  Found down.  Fell in bathtub.  EXAM: CT HEAD WITHOUT CONTRAST  CT CERVICAL SPINE WITHOUT CONTRAST  TECHNIQUE: Multidetector CT imaging of the head and cervical spine was performed following the standard protocol without intravenous contrast. Multiplanar CT image reconstructions of the cervical spine were also generated.  COMPARISON:  01/15/2013 head CT. Scout view of the paranasal sinus CT 01/15/2013. Brain MR 08/17/2004.  FINDINGS: CT HEAD FINDINGS  No skull fracture or intracranial hemorrhage. Small vessel disease type changes without CT evidence of large acute infarct. Partial opacification right sphenoid sinus air cell and upper  aspect of the right pterygoid plate containing hyperdense material which may represent inspissated material. No intracranial mass lesion noted on this unenhanced exam. No hydrocephalus.  CT CERVICAL SPINE FINDINGS  No cervical spine fracture noted.  Minimal anterior slip of C3 and C4 felt to be related to facet joint degenerative changes.  Minimal anterior widening of the posterior aspect of the left C5-6 and C6-7 facet joint. This may be normal for this patient rather than related to injury however if there was a high clinical suspicion of ligamentous injury, MR or flexion and extension views could be obtained for further delineation.  C5-6 and C6-7 disc degeneration with broad-based spur causing mild spinal stenosis and cord flattening.  No abnormal prevertebral soft tissue swelling.  Dilated esophagus. Patient has an esophageal stent in place and this may be  related to treatment of tumor although incompletely assessed.  IMPRESSION:  CT head:  No skull fracture or intracranial hemorrhage.  Small vessel disease type changes without CT evidence of large acute infarct.  Partial opacification right sphenoid sinus air cell and upper aspect of the right pterygoid plate containing hyperdense material which may represent inspissated material.  CT cervical spine:  No cervical spine fracture noted.  Minimal anterior slip of C3 and C4 felt to be related to facet joint degenerative changes.  Minimal anterior widening of the posterior aspect of the left C5-6 and C6-7 facet joint. This may be normal for this patient rather than related to injury however if there was a high clinical suspicion of ligamentous injury, MR or flexion and extension views could be obtained for further delineation.  C5-6 and C6-7 disc degeneration with broad-based spur causing mild spinal stenosis and cord flattening.  No abnormal prevertebral soft tissue swelling.  Dilated esophagus. Patient has an esophageal stent in place and this may be related to  treatment of tumor although incompletely assessed.   Electronically Signed   By: Chauncey Cruel M.D.   On: 01/06/2014 14:05    Scheduled Meds: . ampicillin-sulbactam (UNASYN) IV  3 g Intravenous Q6H  . arformoterol  15 mcg Nebulization BID  . budesonide  0.25 mg Nebulization BID  . dextromethorphan-guaiFENesin  1 tablet Oral BID  . digoxin  0.5 mg Intravenous Once  . [START ON 01/08/2014] digoxin  0.125 mg Oral Daily  . furosemide  40 mg Oral Daily  . levofloxacin (LEVAQUIN) IV  750 mg Intravenous Once  . metoCLOPramide  5 mg Oral BID  . olopatadine  1 drop Both Eyes BID  . pantoprazole  40 mg Oral BID  . sucralfate  1 g Oral BID  . Vitamin D (Ergocalciferol)  50,000 Units Oral Q7 days  . zolpidem  5 mg Oral QHS   Continuous Infusions: . sodium chloride    . sodium chloride 125 mL/hr at 01/07/14 1005    Time spent: 35 minutes  Delfina Redwood, MD [facility] Pager [phone]. If 7PM-7AM, please contact night-coverage at [email or web address], password [facility] 01/07/2014, 11:03 AM  LOS: 1 day

## 2014-01-07 NOTE — Progress Notes (Signed)
INITIAL NUTRITION ASSESSMENT  DOCUMENTATION CODES Per approved criteria  -Not Applicable   INTERVENTION: Ensure Complete po daily, each supplement provides 350 kcal and 13 grams of protein RD to follow for nutrition care plan  NUTRITION DIAGNOSIS: Inadequate oral intake related to decreased appetite as evidenced pt report  Goal: Pt to meet >/= 90% of their estimated nutrition needs   Monitor:  PO & supplemental intake, weight, labs, I/O's  Reason for Assessment: Malnutrition Screening Tool Report  80 y.o. female  Admitting Dx: Fall at home  ASSESSMENT: 80 y.o. Female with PMH of GERD, osteoporosis, PNA and HTN; presented with fall and back pain.  Patient reports her appetite "isn't too good;" does occasionally drink Ensure or Boost supplements at home; she endorses "a little" weight loss; per wt readings below, pt has had a 4% weight loss since March 2015 -- not significant for time frame; pt would like chocolate Ensure Complete supplement with lunch during hospital stay.  Dietary recall: Breakfast: pancakes or cheese omelet  Lunch: chicken pot pie or cheese sandwich Dinner: meat, starch, veggie  No muscle or subcutaneous fat depletion noticed.  Height: Ht Readings from Last 1 Encounters:  01/06/14 5' (1.524 m)    Weight: Wt Readings from Last 1 Encounters:  01/06/14 115 lb 11.9 oz (52.5 kg)    Ideal Body Weight: 100 lb  % Ideal Body Weight: 115%  Wt Readings from Last 10 Encounters:  01/06/14 115 lb 11.9 oz (52.5 kg)  12/20/13 114 lb (51.71 kg)  12/10/13 114 lb (51.71 kg)  12/02/13 112 lb (50.803 kg)  11/12/13 116 lb (52.617 kg)  11/11/13 115 lb (52.164 kg)  10/28/13 115 lb (52.164 kg)  10/22/13 114 lb (51.71 kg)  10/21/13 114 lb (51.71 kg)  10/14/13 120 lb (54.432 kg)    Usual Body Weight: 114 lb  % Usual Body Weight: 101%  BMI:  Body mass index is 22.6 kg/(m^2).  Estimated Nutritional Needs: Kcal: 1200-1400 Protein: 60-70 gm Fluid: >/= 1.5  L  Skin: Intact  Diet Order: General  EDUCATION NEEDS: -No education needs identified at this time   Intake/Output Summary (Last 24 hours) at 01/07/14 1539 Last data filed at 01/07/14 1404  Gross per 24 hour  Intake 2197.92 ml  Output    830 ml  Net 1367.92 ml    Labs:   Recent Labs Lab 01/06/14 1402 01/07/14 1144  NA 138 136*  K 3.9 3.5*  CL 98 100  CO2 25 25  BUN 13 12  CREATININE 0.53 0.65  CALCIUM 9.1 7.9*  MG  --  1.5  GLUCOSE 143* 140*    CBG (last 3)   Recent Labs  01/06/14 2139 01/07/14 0622 01/07/14 1142  GLUCAP 105* 117* 129*    Scheduled Meds: . ampicillin-sulbactam (UNASYN) IV  3 g Intravenous Q8H  . arformoterol  15 mcg Nebulization BID  . budesonide  0.25 mg Nebulization BID  . dextromethorphan-guaiFENesin  1 tablet Oral BID  . [START ON 01/08/2014] digoxin  0.125 mg Oral Daily  . furosemide  40 mg Oral Daily  . levofloxacin (LEVAQUIN) IV  750 mg Intravenous Once  . magnesium sulfate 1 - 4 g bolus IVPB  4 g Intravenous Once  . metoCLOPramide  5 mg Oral BID  . olopatadine  1 drop Both Eyes BID  . pantoprazole  40 mg Oral BID  . potassium chloride  10 mEq Intravenous Q1 Hr x 4  . sucralfate  1 g Oral BID  . Vitamin  D (Ergocalciferol)  50,000 Units Oral Q7 days  . zolpidem  5 mg Oral QHS    Continuous Infusions: . sodium chloride    . sodium chloride 125 mL/hr at 01/07/14 1005    Past Medical History  Diagnosis Date  . GERD (gastroesophageal reflux disease)   . Osteoporosis   . Postmenopausal HRT (hormone replacement therapy)   . Anemia   . Hypertension   . Allergy   . Asthma   . Shortness of breath   . PONV (postoperative nausea and vomiting)   . Dysrhythmia     a fib  . Esophageal cancer     removed esophagus stomach pulled up  . Basal cell carcinoma of face     "several; freezes them off"  . Pneumonia     "all the time; probably 5 times in the last year, counting today" (01/06/2014)  . Chronic bronchitis      "practically q winter" (01/06/2014)  . History of blood transfusion 1993    "related to esophagus removal" (01/06/2014)  . Headache(784.0)     "monthly in the past year" (01/06/2014)  . Arthritis     "legs" (01/06/2014)    Past Surgical History  Procedure Laterality Date  . Left oophorectomy  1952  . Esophageal removal of cancer,gastric ppull-thru 1993    . Forearm fracture surgery Right ?1990's  . Esophagogastroduodenoscopy  10/19/2011    Procedure: ESOPHAGOGASTRODUODENOSCOPY (EGD);  Surgeon: Milus Banister, MD;  Location: [facility];  Service: Endoscopy;  Laterality: N/A;  . Balloon dilation  10/19/2011    Procedure: BALLOON DILATION;  Surgeon: Milus Banister, MD;  Location: [facility] ENDOSCOPY;  Service: Endoscopy;  Laterality: N/A;  . Esophagogastroduodenoscopy  11/07/2011    Procedure: ESOPHAGOGASTRODUODENOSCOPY (EGD);  Surgeon: Ladene Artist, MD,FACG;  Location[facility] ENDOSCOPY;  Service: Endoscopy;  Laterality: N/A;  . Esophagogastroduodenoscopy  11/22/2011    Procedure: ESOPHAGOGASTRODUODENOSCOPY (EGD);  Surgeon: Inda Castle, MD;  Location: [facility];  Service: Endoscopy;  Laterality: N/A;  . Esophagogastroduodenoscopy  11/24/2011    Procedure: ESOPHAGOGASTRODUODENOSCOPY (EGD);  Surgeon: Inda Castle, MD;  Location: [facility];  Service: Endoscopy;  Laterality: N/A;  with removable duodenal stent (actually using esophageal partially covered 23X15 located in locked supply room  . Duodenal stent placement  11/24/2011    Procedure: DUODENAL STENT PLACEMENT;  Surgeon: Inda Castle, MD;  Location: [facility] ENDOSCOPY;  Service: Endoscopy;  Laterality: N/A;  . Video bronchoscopy Bilateral 01/18/2013    Procedure: VIDEO BRONCHOSCOPY WITHOUT FLUORO;  Surgeon: Tanda Rockers, MD;  Location: [facility] ENDOSCOPY;  Service: Cardiopulmonary;  Laterality: Bilateral;  . Esophagogastroduodenoscopy N/A 02/11/2013    Procedure: ESOPHAGOGASTRODUODENOSCOPY (EGD);  Surgeon: Inda Castle, MD;  Location: [facility];   Service: Endoscopy;  Laterality: N/A;  . Duodenal stent placement N/A 02/11/2013    Procedure: DUODENAL STENT PLACEMENT;  Surgeon: Inda Castle, MD;  Location: [facility] ENDOSCOPY;  Service: Endoscopy;  Laterality: N/A;  . Esophagogastroduodenoscopy N/A 09/11/2013    Procedure: ESOPHAGOGASTRODUODENOSCOPY (EGD);  Surgeon: Gatha Mayer, MD;  Location: [facility];  Service: Endoscopy;  Laterality: N/A;  . Cholecystectomy  ~ 1996  . Dilation and curettage of uterus  X 6-7  . Cataract extraction w/ intraocular lens  implant, bilateral Bilateral ~ 2000    Katie Deetta Siegmann, RD, LDN Pager #: [phone] After-Hours Pager #: [phone]

## 2014-01-07 NOTE — Evaluation (Signed)
Occupational Therapy Evaluation Patient Details Name: [name] MRN: [id number] DOB: [birth year] Today's Date: 01/07/2014    History of Present Illness [name] is a 80 y.o. female came to ED  01/06/2014.  She carries a history of hx of esophageal cancer + gastric outlet syndrome w/ prev. Esophagogastrectomy ~ 1993 with gastric pull-through-complications of hematemesis 09/09/13, prior gastroparesis, recurrent admissions for Aspiration PNA and multiple endoscopic interventions including 4 daily dose dense followed at Dr. Deatra Ina, chronic Jerrye Bushy presented to [facility] today after being found down in her bathroom for an unknown period of time. Pt was admitted dx: Fall at home. MRI pending   Clinical Impression   Pt admitted s/p fall at home, lives alone and was Mod I PTA, drives. Pt currently presenting w/ deficits requiring physical assist for functional mobility/transfers and ADL's. She has a posterior & left lateral lean in standing and states this has been ongoing, now w/ h/o falls. Pt has significant cough and states she began coughing at home and then fell in bathtub. Will follow acutely to assist w/ increased independence w/ ADL and functional mobility. Pt will benefit from CIR screen for possible In-pt Rehab as able.    Follow Up Recommendations  CIR;Supervision/Assistance - 24 hour    Equipment Recommendations  Other (comment)    Recommendations for Other Services       Precautions / Restrictions Precautions Precautions: Fall;Other (comment) (DNR) Restrictions Weight Bearing Restrictions: No      Mobility Bed Mobility Overal bed mobility: Needs Assistance Bed Mobility: Supine to Sit     Supine to sit: Min assist     General bed mobility comments: Min A and vc's for safety  Transfers Overall transfer level: Needs assistance Equipment used: 1 person hand held assist Transfers: Sit to/from [facility] Sit to Stand: Mod assist Stand  pivot transfers: Min assist       General transfer comment:  (Pt with posterior lean with initial stand from 3:1, EOB and recliner noted.)    Balance Overall balance assessment: History of Falls;Needs assistance Sitting-balance support: Feet supported;No upper extremity supported Sitting balance-Leahy Scale: Fair   Postural control: Left lateral lean Standing balance support: Bilateral upper extremity supported;During functional activity Standing balance-Leahy Scale: Poor Standing balance comment:  (Posterior lean and left lateral lean in standing noted, pt requires physical assist to correct.)                            ADL Overall ADL's : Needs assistance/impaired     Grooming: Wash/dry hands;Wash/dry face;Set up;Sitting   Upper Body Bathing: Set up;Sitting   Lower Body Bathing: Moderate assistance;Sit to/from stand       Lower Body Dressing: Set up;Moderate assistance;Sit to/from stand   Toilet Transfer: Moderate assistance;BSC;Stand-pivot;Ambulation;Cueing for safety;Cueing for sequencing Toilet Transfer Details (indicate cue type and reason): Pt transferred to 3:1 x2 during this therapy session, pt demonstrates posterior & left lateral lean with sit to stand noted and requires physical assist to correct. Toileting- Clothing Manipulation and Hygiene: Minimal assistance;Sitting/left lateral lean       Functional mobility during ADLs: Minimal assistance;Moderate assistance;Cueing for safety;Cueing for sequencing General ADL Comments:  Pt was educated in role of OT and participated in ADL retraining session after assessment today. Pt appears to fatigue from coughing during minimal activity. Pt currently requires physical assist for all ADL's and transfers, rec CIR consult/rehab secondary to lives alone & was independent PTA. Pt  was sitting up in chair, w/ call bell/phone in reach, eating breakfast and RN in room at conclusion of session.     Vision  Wears reading  glasses, no change from baseline per pt report.                   Perception     Praxis      Pertinent Vitals/Pain Pt denies pain, No pain.     Hand Dominance Right   Extremity/Trunk Assessment Upper Extremity Assessment Upper Extremity Assessment: Generalized weakness   Lower Extremity Assessment Lower Extremity Assessment: Generalized weakness   Cervical / Trunk Assessment Cervical / Trunk Assessment: Kyphotic   Communication Communication Communication: No difficulties   Cognition Arousal/Alertness: Awake/alert Behavior During Therapy: WFL for tasks assessed/performed Overall Cognitive Status: Within Functional Limits for tasks assessed                     General Comments               Home Living Family/patient expects to be discharged to:: Private residence Living Arrangements: Alone Available Help at Discharge: Available PRN/intermittently;Other (Comment) (Neighbor/family) Type of Home: House Home Access: Stairs to enter [employer] of Steps: 5 STE Entrance Stairs-Rails: Right Home Layout: One level     Bathroom Shower/Tub: Occupational psychologist: Handicapped height Bathroom Accessibility: Yes How Accessible: Accessible via walker Home Equipment: [employer] - 2 wheels;Shower seat - built in;Grab bars - toilet;Grab bars - tub/shower;Hand held shower head          Prior Functioning/Environment Level of Independence: Independent with assistive device(s)        Comments: sleeps in recliner at home. Pt drives. Pt also reports that she had assistance at home 2x/week for 4 hours until recently "She didn't do, much, I can do everything myself, I didn't need her any more."    OT Diagnosis: Generalized weakness;Other (comment) (Decreased endurance, h/o falls, decreaed activity tolerance, cough, c/o dizziness.)   OT Problem List: Decreased strength;Decreased activity tolerance;Impaired balance (sitting and/or  standing);Decreased knowledge of precautions;Decreased knowledge of use of DME or AE;Cardiopulmonary status limiting activity;Other (comment) (Pt w/ h/o falls & LOB, decreased endurance.)   OT Treatment/Interventions: Self-care/ADL training;Energy conservation;DME and/or AE instruction;Patient/family education;Therapeutic activities;Balance training    OT Goals(Current goals can be found in the care plan section) Acute Rehab OT Goals Patient Stated Goal: Go home when able Time For Goal Achievement: 01/21/14 Potential to Achieve Goals: Good  OT Frequency: Min 2X/week   Barriers to D/C:            Co-evaluation              End of Session Equipment Utilized During Treatment: Gait belt;Oxygen (2 L O2 via nasal cannula) Nurse Communication: Mobility status;Other (comment) (Transfers, rec SNF.)  Activity Tolerance: Patient tolerated treatment well Patient left: in chair;with call bell/phone within reach;with nursing/sitter in room   Time: 2979-8921 OT Time Calculation (min): 38 min Charges:  OT General Charges $OT Visit: 1 Procedure OT Evaluation $Initial OT Evaluation Tier I: 1 Procedure OT Treatments $Self Care/Home Management : 23-37 mins (25 min) G-Codes:    Amy B Barnhill 01-25-14, 8:51 AM

## 2014-01-07 NOTE — Progress Notes (Signed)
Rehab Admissions Coordinator Note:  Patient was screened by [name] for appropriateness for an Inpatient Acute Rehab Consult.  At this time, we are recommending Inpatient Rehab consult.  [name], PT 01/07/2014, 10:55 AM  I can be reached at [phone].

## 2014-01-07 NOTE — Evaluation (Signed)
Physical Therapy Evaluation Patient Details Name: [name] MRN: [id number] DOB: [birth year] Today's Date: 01/07/2014   History of Present Illness  [name] is a 80 y.o. female came to ED  01/06/2014.  She carries a history of hx of esophageal cancer + gastric outlet syndrome w/ prev. Esophagogastrectomy ~ 1993 with gastric pull-through-complications of hematemesis 09/09/13, prior gastroparesis, recurrent admissions for Aspiration PNA and multiple endoscopic interventions including 4 daily dose dense followed at Dr. Deatra Ina, chronic Jerrye Bushy presented to [facility] today after being found down in her bathroom for an unknown period of time. Pt was admitted dx: Fall at home. MRI pending  Clinical Impression  Eval limited today to bed mobility, strength assessment, and light ex's due to pt's HR in mid 130's sitting on EOB. Feel pt will definitely need some form of post acute rehab at dc but need to see more mobility before making that recommendation.    Follow Up Recommendations Other (comment) (To be determined when mobility not limited by high HR)    Equipment Recommendations  None recommended by PT    Recommendations for Other Services       Precautions / Restrictions Precautions Precautions: Fall      Mobility  Bed Mobility Overal bed mobility: Needs Assistance Bed Mobility: Sit to Supine       Sit to supine: Min assist   General bed mobility comments: Assist to bring feet up onto bed.  Transfers                 General transfer comment: Not tested due to high HR  Ambulation/Gait                Stairs            Wheelchair Mobility    Modified Rankin (Stroke Patients Only)       Balance Overall balance assessment: Needs assistance Sitting-balance support: No upper extremity supported;Feet supported Sitting balance-Leahy Scale: Fair                                       Pertinent Vitals/Pain HR mid 130's  with sitting EOB.    Home Living Family/patient expects to be discharged to:: Private residence Living Arrangements: Alone Available Help at Discharge: Available PRN/intermittently;Other (Comment);Neighbor;Family Type of Home: House Home Access: Stairs to enter Entrance Stairs-Rails: Right Entrance Stairs-Number of Steps: 5 Home Layout: One level Home Equipment: Walker - 2 wheels;Shower seat - built in;Grab bars - toilet;Grab bars - tub/shower;Hand held shower head      Prior Function Level of Independence: Independent with assistive device(s)         Comments: sleeps in recliner at home. Pt drives. Pt also reports that she had assistance at home 2x/week for 4 hours until recently "She didn't do, much, I can do everything myself, I didn't need her any more." Had not been using walker.     Hand Dominance   Dominant Hand: Right    Extremity/Trunk Assessment   Upper Extremity Assessment: Defer to OT evaluation           Lower Extremity Assessment: Generalized weakness         Communication   Communication: No difficulties  Cognition Arousal/Alertness: Awake/alert Behavior During Therapy: WFL for tasks assessed/performed Overall Cognitive Status: Within Functional Limits for tasks assessed  General Comments      Exercises General Exercises - Lower Extremity Ankle Circles/Pumps: AROM;Both;10 reps;Supine Quad Sets: AROM;Both;10 reps;Supine Heel Slides: AROM;Both;5 reps;Supine      Assessment/Plan    PT Assessment Patient needs continued PT services  PT Diagnosis Difficulty walking;Generalized weakness   PT Problem List Decreased strength;Decreased activity tolerance;Decreased balance;Decreased mobility;Cardiopulmonary status limiting activity  PT Treatment Interventions DME instruction;Gait training;Functional mobility training;Therapeutic activities;Therapeutic exercise;Balance training;Patient/family education   PT Goals  (Current goals can be found in the Care Plan section) Acute Rehab PT Goals Patient Stated Goal: Go home when able PT Goal Formulation: With patient Time For Goal Achievement: 01/21/14 Potential to Achieve Goals: Good    Frequency Min 3X/week   Barriers to discharge Decreased caregiver support      Co-evaluation               End of Session   Activity Tolerance: Treatment limited secondary to medical complications (Comment) (hight HR) Patient left: in bed;with call bell/phone within reach;with nursing/sitter in room           Time: 1450-1503 PT Time Calculation (min): 13 min   Charges:   PT Evaluation $Initial PT Evaluation Tier I: 1 Procedure     PT G CodesShary Decamp Lalana Wachter 01/07/2014, 3:29 PM  [employer] PT [phone]

## 2014-01-07 NOTE — Progress Notes (Signed)
Nursing note Patient heart rate remaining upper 120s, bp 94/52. DR Junius Finner made aware and orders received will continue to monitor patient. Bettina Gavia Marq Rebello RN

## 2014-01-08 ENCOUNTER — Inpatient Hospital Stay (HOSPITAL_COMMUNITY): Payer: Medicare Other

## 2014-01-08 DIAGNOSIS — Z8501 Personal history of malignant neoplasm of esophagus: Secondary | ICD-10-CM

## 2014-01-08 DIAGNOSIS — I4891 Unspecified atrial fibrillation: Secondary | ICD-10-CM

## 2014-01-08 DIAGNOSIS — I359 Nonrheumatic aortic valve disorder, unspecified: Secondary | ICD-10-CM

## 2014-01-08 LAB — GLUCOSE, CAPILLARY
Glucose-Capillary: 89 mg/dL (ref 70–99)
Glucose-Capillary: 119 mg/dL — ABNORMAL HIGH (ref 70–99)
Glucose-Capillary: 93 mg/dL (ref 70–99)
Glucose-Capillary: 100 mg/dL — ABNORMAL HIGH (ref 70–99)

## 2014-01-08 LAB — URINE CULTURE

## 2014-01-08 LAB — BASIC METABOLIC PANEL
BUN: 6 mg/dL (ref 6–23)
CO2: 23 mEq/L (ref 19–32)
CREATININE: 0.63 mg/dL (ref 0.50–1.10)
Calcium: 7.8 mg/dL — ABNORMAL LOW (ref 8.4–10.5)
Chloride: 106 mEq/L (ref 96–112)
GFR calc Af Amer: >90 mL/min (ref >90)
GFR, EST NON AFRICAN AMERICAN: 81 mL/min — AB (ref >90)
Glucose, Bld: 164 mg/dL — ABNORMAL HIGH (ref 70–99)
Potassium: 3.5 mEq/L — ABNORMAL LOW (ref 3.7–5.3)
Sodium: 141 mEq/L (ref 137–147)

## 2014-01-08 LAB — MAGNESIUM: Magnesium: 2 mg/dL (ref 1.5–2.5)

## 2014-01-08 MED ORDER — ACETAMINOPHEN 325 MG PO TABS
650.0000 mg | ORAL_TABLET | Freq: Three times a day (TID) | ORAL | Status: DC
  Administered 2014-01-08 (×3): 650 mg via ORAL
  Filled 2014-01-08 (×3): qty 2

## 2014-01-08 MED ORDER — SODIUM CHLORIDE 0.9 % IJ SOLN
10.0000 mL | INTRAMUSCULAR | Status: DC | PRN
  Administered 2014-01-09: 10 mL

## 2014-01-08 MED ORDER — ASPIRIN EC 325 MG PO TBEC
325.0000 mg | DELAYED_RELEASE_TABLET | Freq: Every day | ORAL | Status: DC
  Filled 2014-01-08: qty 1

## 2014-01-08 MED ORDER — POTASSIUM CHLORIDE 10 MEQ/100ML IV SOLN
10.0000 meq | INTRAVENOUS | Status: AC
  Administered 2014-01-08 (×3): 10 meq via INTRAVENOUS
  Filled 2014-01-08 (×3): qty 100

## 2014-01-08 MED ORDER — LEVOFLOXACIN IN D5W 500 MG/100ML IV SOLN
500.0000 mg | INTRAVENOUS | Status: DC
  Filled 2014-01-08: qty 100

## 2014-01-08 MED ORDER — LEVOFLOXACIN IN D5W 250 MG/50ML IV SOLN
250.0000 mg | INTRAVENOUS | Status: DC
  Administered 2014-01-08 – 2014-01-09 (×2): 250 mg via INTRAVENOUS
  Filled 2014-01-08 (×2): qty 50

## 2014-01-08 NOTE — Progress Notes (Signed)
  Echocardiogram 2D Echocardiogram has been performed.  Diamond Nickel 01/08/2014, 4:41 PM

## 2014-01-08 NOTE — Progress Notes (Signed)
Rehab admissions - Evaluated for possible admission.  I met with patient, gave her rehab brochures and answered questions.  She would like to come to inpatient rehab.  Currently on amiodarone drip.  I spoke with Dr. Conley Canal who feels patient not medically ready yet for inpatient rehab.  Will follow along for now.  Call me for questions.  #[phone]

## 2014-01-08 NOTE — Progress Notes (Signed)
Physical Therapy Treatment Patient Details Name: [name] MRN: [id number] DOB: [birth year] Today's Date: 01/08/2014    History of Present Illness [name] is a 80 y.o. female came to ED  01/06/2014.  She carries a history of hx of esophageal cancer + gastric outlet syndrome w/ prev. Esophagogastrectomy ~ 1993 with gastric pull-through-complications of hematemesis 09/09/13, prior gastroparesis, recurrent admissions for Aspiration PNA and multiple endoscopic interventions including 4 daily dose dense followed at Dr. Deatra Ina, chronic Jerrye Bushy presented to [facility] today after being found down in her bathroom for an unknown period of time. Pt was admitted dx: Fall at home. MRI pending    PT Comments    Pt with better controlled HR today. Pt demonstrates balance deficits putting her at high fall risk. Feel she could benefit from short CIR stay.  Follow Up Recommendations  CIR     Equipment Recommendations  None recommended by PT    Recommendations for Other Services       Precautions / Restrictions Precautions Precautions: Fall    Mobility  Bed Mobility Overal bed mobility: Needs Assistance Bed Mobility: Supine to Sit;Sit to Supine       Sit to supine: Min assist   General bed mobility comments: Assist to bring feet up onto bed.  Transfers Overall transfer level: Needs assistance Equipment used: Rolling walker (2 wheeled) Transfers: Sit to/from Stand Sit to Stand: Min assist         General transfer comment: Verbal cues for hand placement and assist for balance.  Ambulation/Gait Ambulation/Gait assistance: Min assist Ambulation Distance (Feet): 150 Feet Assistive device: Rolling walker (2 wheeled) Gait Pattern/deviations: Step-through pattern;Narrow base of support     General Gait Details: Pt with occasional posterior stagger.   Stairs            Wheelchair Mobility    Modified Rankin (Stroke Patients Only)       Balance            Standing balance support: Bilateral upper extremity supported Standing balance-Leahy Scale: Poor Standing balance comment: pt with tendency to have posterior lean                    Cognition Arousal/Alertness: Awake/alert Behavior During Therapy: WFL for tasks assessed/performed Overall Cognitive Status: Within Functional Limits for tasks assessed                      Exercises      General Comments        Pertinent Vitals/Pain HR 70's    Home Living                      Prior Function            PT Goals (current goals can now be found in the care plan section) Progress towards PT goals: Progressing toward goals    Frequency  Min 3X/week    PT Plan Current plan remains appropriate    Co-evaluation             End of Session Equipment Utilized During Treatment: Gait belt Activity Tolerance: Patient tolerated treatment well Patient left: in bed;with call bell/phone within reach;Other (comment) (rehab admissions coordinator)     Time: 5638-7564 PT Time Calculation (min): 10 min  Charges:  $Gait Training: 8-22 mins                    G Codes:  [name] 01/08/2014, 10:42 AM  [name] PT [phone]

## 2014-01-08 NOTE — Progress Notes (Signed)
Peripherally Inserted Central Catheter/Midline Placement  The IV Nurse has discussed with the patient and/or persons authorized to consent for the patient, the purpose of this procedure and the potential benefits and risks involved with this procedure.  The benefits include less needle sticks, lab draws from the catheter and patient may be discharged home with the catheter.  Risks include, but not limited to, infection, bleeding, blood clot (thrombus formation), and puncture of an artery; nerve damage and irregular heat beat.  Alternatives to this procedure were also discussed.  PICC/Midline Placement Documentation        [name] 01/08/2014, 2:34 PM

## 2014-01-08 NOTE — Consult Note (Signed)
Reason for Consult: atrial fibrillation with RVR  Requesting Physician: Conley Canal  Cardiologist: Johnsie Cancel  HPI: This is a 80 y.o. female with a past medical history significant for A fib, HTN, esophageal cancer s/p esophagectomy with gastric pull thorough, severe GERD, gastroparesis/gastric outlet obstruction, multiple GI procedures and stents, recurrent aspiration PNA for past 6 months. She is admitted after a fall and prolonged immobilization in her garden tub. She had atrial fibrillation during pneumonia in 2013 and again during this admission, with rates up to 160 at rest. Rate control has been a challenge due to hypotension. Amiodarone IV was started and she just converted to NSR a few minutes ago, now NSR with PACs around 890 bpm. She is no longer taking anticoagulants due to hemoptysis and cannot take ASA due to her GI issues. She feels great right now and is eager to start rehab and "get her strength back". First impression is that she is rather frail.   PMHx:  Past Medical History  Diagnosis Date  . GERD (gastroesophageal reflux disease)   . Osteoporosis   . Postmenopausal HRT (hormone replacement therapy)   . Anemia   . Hypertension   . Allergy   . Asthma   . Shortness of breath   . PONV (postoperative nausea and vomiting)   . Dysrhythmia     a fib  . Esophageal cancer     removed esophagus stomach pulled up  . Basal cell carcinoma of face     "several; freezes them off"  . Pneumonia     "all the time; probably 5 times in the last year, counting today" (01/06/2014)  . Chronic bronchitis     "practically q winter" (01/06/2014)  . History of blood transfusion 1993    "related to esophagus removal" (01/06/2014)  . Headache(784.0)     "monthly in the past year" (01/06/2014)  . Arthritis     "legs" (01/06/2014)   Past Surgical History  Procedure Laterality Date  . Left oophorectomy  1952  . Esophageal removal of cancer,gastric ppull-thru 1993    . Forearm fracture  surgery Right ?1990's  . Esophagogastroduodenoscopy  10/19/2011    Procedure: ESOPHAGOGASTRODUODENOSCOPY (EGD);  Surgeon: Milus Banister, MD;  Location: [facility];  Service: Endoscopy;  Laterality: N/A;  . Balloon dilation  10/19/2011    Procedure: BALLOON DILATION;  Surgeon: Milus Banister, MD;  Location: [facility];  Service: Endoscopy;  Laterality: N/A;  . Esophagogastroduodenoscopy  11/07/2011    Procedure: ESOPHAGOGASTRODUODENOSCOPY (EGD);  Surgeon: Ladene Artist, MD,FACG;  Location: [facility] ENDOSCOPY;  Service: Endoscopy;  Laterality: N/A;  . Esophagogastroduodenoscopy  11/22/2011    Procedure: ESOPHAGOGASTRODUODENOSCOPY (EGD);  Surgeon: Inda Castle, MD;  Location: [facility];  Service: Endoscopy;  Laterality: N/A;  . Esophagogastroduodenoscopy  11/24/2011    Procedure: ESOPHAGOGASTRODUODENOSCOPY (EGD);  Surgeon: Inda Castle, MD;  Location: [facility];  Service: Endoscopy;  Laterality: N/A;  with removable duodenal stent (actually using esophageal partially covered 23X15 located in locked supply room  . Duodenal stent placement  11/24/2011    Procedure: DUODENAL STENT PLACEMENT;  Surgeon: Inda Castle, MD;  Location: [facility];  Service: Endoscopy;  Laterality: N/A;  . Video bronchoscopy Bilateral 01/18/2013    Procedure: VIDEO BRONCHOSCOPY WITHOUT FLUORO;  Surgeon: Tanda Rockers, MD;  Location: [facility]COPY;  Service: Cardiopulmonary;  Laterality: Bilateral;  . Esophagogastroduodenoscopy N/A 02/11/2013    Procedure: ESOPHAGOGASTRODUODENOSCOPY (EGD);  Surgeon: Inda Castle, MD;  Location: [facility];  Service: Endoscopy;  Laterality: N/A;  . Duodenal stent placement N/A 02/11/2013    Procedure: DUODENAL STENT PLACEMENT;  Surgeon: Inda Castle, MD;  Location: [facility];  Service: Endoscopy;  Laterality: N/A;  . Esophagogastroduodenoscopy N/A 09/11/2013    Procedure: ESOPHAGOGASTRODUODENOSCOPY (EGD);  Surgeon: Gatha Mayer, MD;  Location: [facility];  Service:  Endoscopy;  Laterality: N/A;  . Cholecystectomy  ~ 1996  . Dilation and curettage of uterus  X 6-7  . Cataract extraction w/ intraocular lens  implant, bilateral Bilateral ~ 2000    FAMHx: Family History  Problem Relation Age of Onset  . Cervical cancer Mother   . Heart disease Father     MI  . Coronary artery disease Brother   . Hypotension Sister   . Colon cancer Neg Hx   . Colon polyps Sister   . Pancreatic cancer      1/2 sister  . Coronary artery disease Sister     pacemaker  . Asthma Brother   . Asthma Sister     SOCHx:  reports that she has never smoked. She has never used smokeless tobacco. She reports that she does not drink alcohol or use illicit drugs.  ALLERGIES: Allergies  Allergen Reactions  . Erythromycin Ethylsuccinate     Irregular pulse rate  . Prednisone     "makes me hyper"  . Doxycycline Rash    ROS: Chronic lower extremity edema, varicose veins, recurrent hemoptysis while on warfarin, but not recently, dysphagia and severe reflux. Tremor from metoclopramide, chronic neuropathic lower extremities  The patient specifically denies any chest pain at rest or with exertion, dyspnea at rest or with exertion, orthopnea, paroxysmal nocturnal dyspnea, syncope, palpitations, focal neurological deficits, intermittent claudication, unexplained weight gain, cough or wheezing.  The patient also denies abdominal pain, nausea, vomiting, diarrhea, constipation, polyuria, polydipsia, dysuria, hematuria, frequency, urgency, abnormal bleeding or bruising, fever, chills, unexpected weight changes, mood swings, change in skin or hair texture, change in voice quality, auditory or visual problems, allergic reactions or rashes, new musculoskeletal complaints other than usual "aches and pains".   HOME MEDICATIONS: Prescriptions prior to admission  Medication Sig Dispense Refill  . acetaminophen (TYLENOL) 500 MG tablet Take 1 tablet (500 mg total) by mouth every 6 (six)  hours as needed for headache.  30 tablet    . albuterol (PROVENTIL HFA;VENTOLIN HFA) 108 (90 BASE) MCG/ACT inhaler Inhale 2 puffs into the lungs every 6 (six) hours as needed. For wheezing  1 Inhaler  4  . albuterol (PROVENTIL) (2.5 MG/3ML) 0.083% nebulizer solution Take 3 mLs (2.5 mg total) by nebulization every 4 (four) hours as needed for wheezing or shortness of breath.  75 mL  12  . arformoterol (BROVANA) 15 MCG/2ML NEBU Take 2 mLs (15 mcg total) by nebulization 2 (two) times daily. Dx: 493.00  120 mL    . budesonide (PULMICORT) 0.25 MG/2ML nebulizer solution Take 2 mLs (0.25 mg total) by nebulization 2 (two) times daily. Dx: 493.00  60 mL    . chlorpheniramine-HYDROcodone (TUSSIONEX PENNKINETIC ER) 10-8 MG/5ML LQCR Take 5 mLs by mouth every 12 (twelve) hours as needed for cough.  480 mL  0  . dextromethorphan-guaiFENesin (MUCINEX DM) 30-600 MG per 12 hr tablet Take 0.5 tablets by mouth 2 (two) times daily.      . digoxin (LANOXIN) 0.125 MG tablet Take 1 tablet (0.125 mg total) by mouth daily.  90 tablet  3  . Fe Fum-FA-B Cmp-C-Zn-Mg-Mn-Cu (HEMOCYTE PLUS) 106-1 MG CAPS Take 1 tablet by mouth  2 (two) times daily.      . furosemide (LASIX) 40 MG tablet Take 1 tablet (40 mg total) by mouth daily.  30 tablet  0  . lansoprazole (PREVACID SOLUTAB) 30 MG disintegrating tablet Take 2 tablets (60 mg total) by mouth 2 (two) times daily.  360 tablet  3  . Methylcobalamin (B-12) 5000 MCG TBDP Take 1 tablet by mouth daily.      . metoCLOPramide (REGLAN) 5 MG tablet Take 1 tablet (5 mg total) by mouth 2 (two) times daily.  180 tablet  3  . Multiple Vitamins-Calcium (VIACTIV MULTI-VITAMIN) CHEW Chew 1 each by mouth daily.    0  . Olopatadine HCl (PATADAY) 0.2 % SOLN Apply 1-2 drops to eye 2 (two) times daily as needed (dry, itchy eyes.).      Marland Kitchen promethazine (PHENERGAN) 25 MG suppository Place 25 mg rectally every 6 (six) hours as needed for nausea or vomiting.      . sucralfate (CARAFATE) 1 GM/10ML suspension  Take 1 g by mouth 2 (two) times daily.      . traMADol (ULTRAM) 50 MG tablet Take 1 tablet (50 mg total) by mouth every 8 (eight) hours as needed for moderate pain.  30 tablet  0  . Vitamin D, Ergocalciferol, (DRISDOL) 50000 UNITS CAPS capsule Take 1 capsule (50,000 Units total) by mouth every 7 (seven) days. On Friday  30 capsule  3  . zolpidem (AMBIEN) 5 MG tablet Take 5 mg by mouth at bedtime.        HOSPITAL MEDICATIONS: Prior to Admission:  Prescriptions prior to admission  Medication Sig Dispense Refill  . acetaminophen (TYLENOL) 500 MG tablet Take 1 tablet (500 mg total) by mouth every 6 (six) hours as needed for headache.  30 tablet    . albuterol (PROVENTIL HFA;VENTOLIN HFA) 108 (90 BASE) MCG/ACT inhaler Inhale 2 puffs into the lungs every 6 (six) hours as needed. For wheezing  1 Inhaler  4  . albuterol (PROVENTIL) (2.5 MG/3ML) 0.083% nebulizer solution Take 3 mLs (2.5 mg total) by nebulization every 4 (four) hours as needed for wheezing or shortness of breath.  75 mL  12  . arformoterol (BROVANA) 15 MCG/2ML NEBU Take 2 mLs (15 mcg total) by nebulization 2 (two) times daily. Dx: 493.00  120 mL    . budesonide (PULMICORT) 0.25 MG/2ML nebulizer solution Take 2 mLs (0.25 mg total) by nebulization 2 (two) times daily. Dx: 493.00  60 mL    . chlorpheniramine-HYDROcodone (TUSSIONEX PENNKINETIC ER) 10-8 MG/5ML LQCR Take 5 mLs by mouth every 12 (twelve) hours as needed for cough.  480 mL  0  . dextromethorphan-guaiFENesin (MUCINEX DM) 30-600 MG per 12 hr tablet Take 0.5 tablets by mouth 2 (two) times daily.      . digoxin (LANOXIN) 0.125 MG tablet Take 1 tablet (0.125 mg total) by mouth daily.  90 tablet  3  . Fe Fum-FA-B Cmp-C-Zn-Mg-Mn-Cu (HEMOCYTE PLUS) 106-1 MG CAPS Take 1 tablet by mouth 2 (two) times daily.      . furosemide (LASIX) 40 MG tablet Take 1 tablet (40 mg total) by mouth daily.  30 tablet  0  . lansoprazole (PREVACID SOLUTAB) 30 MG disintegrating tablet Take 2 tablets (60 mg  total) by mouth 2 (two) times daily.  360 tablet  3  . Methylcobalamin (B-12) 5000 MCG TBDP Take 1 tablet by mouth daily.      . metoCLOPramide (REGLAN) 5 MG tablet Take 1 tablet (5 mg total) by mouth 2 (  two) times daily.  180 tablet  3  . Multiple Vitamins-Calcium (VIACTIV MULTI-VITAMIN) CHEW Chew 1 each by mouth daily.    0  . Olopatadine HCl (PATADAY) 0.2 % SOLN Apply 1-2 drops to eye 2 (two) times daily as needed (dry, itchy eyes.).      Marland Kitchen promethazine (PHENERGAN) 25 MG suppository Place 25 mg rectally every 6 (six) hours as needed for nausea or vomiting.      . sucralfate (CARAFATE) 1 GM/10ML suspension Take 1 g by mouth 2 (two) times daily.      . traMADol (ULTRAM) 50 MG tablet Take 1 tablet (50 mg total) by mouth every 8 (eight) hours as needed for moderate pain.  30 tablet  0  . Vitamin D, Ergocalciferol, (DRISDOL) 50000 UNITS CAPS capsule Take 1 capsule (50,000 Units total) by mouth every 7 (seven) days. On Friday  30 capsule  3  . zolpidem (AMBIEN) 5 MG tablet Take 5 mg by mouth at bedtime.       Scheduled: . acetaminophen  650 mg Oral TID  . ampicillin-sulbactam (UNASYN) IV  3 g Intravenous Q8H  . arformoterol  15 mcg Nebulization BID  . aspirin EC  325 mg Oral Daily  . budesonide  0.25 mg Nebulization BID  . dextromethorphan-guaiFENesin  1 tablet Oral BID  . digoxin  0.125 mg Oral Daily  . feeding supplement (ENSURE COMPLETE)  237 mL Oral Q lunch  . furosemide  40 mg Oral Daily  . levofloxacin (LEVAQUIN) IV  500 mg Intravenous Q24H  . levofloxacin (LEVAQUIN) IV  750 mg Intravenous Once  . metoCLOPramide  5 mg Oral BID  . olopatadine  1 drop Both Eyes BID  . pantoprazole  40 mg Oral BID  . sucralfate  1 g Oral BID  . Vitamin D (Ergocalciferol)  50,000 Units Oral Q7 days  . zolpidem  5 mg Oral QHS   Continuous: . sodium chloride    . sodium chloride 125 mL/hr at 01/08/14 0001  . amiodarone 30 mg/hr (01/08/14 0755)    VITALS: Blood pressure 102/60, pulse 102, temperature  97.7 F (36.5 C), temperature source Oral, resp. rate 20, height 5' (1.524 m), weight 115 lb 11.9 oz (52.5 kg), SpO2 100.00%.  PHYSICAL EXAM:  General: Alert, oriented x3, no distress Head: no evidence of trauma, PERRL, EOMI, no exophtalmos or lid lag, no myxedema, no xanthelasma; normal ears, nose and oropharynx Neck: normal jugular venous pulsations and no hepatojugular reflux; brisk carotid pulses without delay and no carotid bruits Chest: reduced basal breath sounds bilaterally, otherwise clear to auscultation, no signs of consolidation by percussion or palpation, normal fremitus, symmetrical and full respiratory excursions Cardiovascular: normal position and quality of the apical impulse, regular rhythm, normal first heart sound and normal second heart sound, no rubs or gallops, no murmur Abdomen: no tenderness or distention, no masses by palpation, no abnormal pulsatility or arterial bruits, normal bowel sounds, no hepatosplenomegaly Extremities: no clubbing, cyanosis; varicose veins and 1-2+ edema; 2+ radial, ulnar and brachial pulses bilaterally; 2+ right femoral, posterior tibial and dorsalis pedis pulses; 2+ left femoral, posterior tibial and dorsalis pedis pulses; no subclavian or femoral bruits Neurological: grossly nonfocal except intentional tremor   LABS  CBC  Recent Labs  01/06/14 1402 01/07/14 1144  WBC 22.4* 15.6*  NEUTROABS 20.0* 12.7*  HGB 13.4 11.4*  HCT 41.5 35.7*  MCV 78.0 77.9*  PLT 463* 518*   Basic Metabolic Panel  Recent Labs  01/06/14 1402 01/07/14 1144  NA  138 136*  K 3.9 3.5*  CL 98 100  CO2 25 25  GLUCOSE 143* 140*  BUN 13 12  CREATININE 0.53 0.65  CALCIUM 9.1 7.9*  MG  --  1.5   Liver Function Tests  Recent Labs  01/06/14 1402  AST 41*  ALT 28  ALKPHOS 105  BILITOT 0.9  PROT 5.9*  ALBUMIN 2.6*   No results found for this basename: LIPASE, AMYLASE,  in the last 72 hours Cardiac Enzymes  Recent Labs  01/06/14 1402  01/07/14 1144  CKTOTAL 470* 206*  TROPONINI <0.30  --    Thyroid Function Tests  Recent Labs  01/07/14 1144  TSH 3.390      IMAGING: Dg Chest 2 View  01/06/2014   CLINICAL DATA:  Fall with pain in the mid to lower back.  EXAM: CHEST  2 VIEW  COMPARISON:  12/20/2013.  CT chest 09/12/2013.  FINDINGS: Surgical clips in the expected location of the thyroid. Trachea is midline. Heart is enlarged. Thoracic aorta is calcified. A long wall stent projects over the lower thoracic spine and traverses a gastric pull-through when compared with 09/12/2013. Patchy left basilar airspace disease. Small left pleural effusion. Osteopenia without definite compression fracture.  IMPRESSION: 1. Patchy left basilar airspace disease may be due to pneumonia. 2. Small left pleural effusion. 3. Osteopenia without definite fracture.   Electronically Signed   By: Lorin Picket M.D.   On: 01/06/2014 13:36   Dg Thoracic Spine 2 View  01/06/2014   CLINICAL DATA:  Fall.  Back pain.  EXAM: THORACIC SPINE - 2 VIEW  COMPARISON:  Lateral chest radiograph 12/20/2013  FINDINGS: No fracture. No spondylolisthesis. There are mild disc degenerative changes along the mid lower thoracic spine. Bones are diffusely demineralized. Vena cava stent extends from the mid chest to the central upper abdomen.  IMPRESSION: No fracture or acute finding.   Electronically Signed   By: Lajean Manes M.D.   On: 01/06/2014 13:35   Dg Lumbar Spine Complete  01/06/2014   CLINICAL DATA:  80 year old female status post fall with pain. Initial encounter. History of esophagectomy with gastric pull-through and stenting  EXAM: LUMBAR SPINE - COMPLETE 4+ VIEW  COMPARISON:  Abdominal 11/17/2011.  Chest CT 09/12/2013.  FINDINGS: Metallic stents in the distal stomach and right upper quadrant Stable from the recent CT. Stable right upper quadrant surgical clips. Multiple surgical clips projecting at the mediastinum and left lung base re- identified. Transitional  lumbosacral anatomy, suspect L5 level being mostly sacralized. Anterolisthesis of L4 on L5 measuring up to 9 mm. Moderate to severe lower lumbar facet hypertrophy and degeneration. Relatively preserved disc spaces. Elsewhere lumbar vertebral height and alignment within normal limits. Grossly intact visualized lower thoracic levels. Sacral ala and SI joints within normal limits. Sclerosis at the pubic symphysis probably is degenerative.  IMPRESSION: 1. Transitional lumbosacral anatomy suspected with sacralized L5 level. Grade 1-2 anterolisthesis of L4 on L5 with advanced facet degeneration. 2.  No acute osseous abnormality identified in the lumbar spine. 3. Postoperative changes to the mediastinum with gastric pull-through metal stent extending into the right upper quadrant.   Electronically Signed   By: Lars Pinks M.D.   On: 01/06/2014 13:39   Ct Head Wo Contrast  01/06/2014   CLINICAL DATA:  Found down.  Fell in bathtub.  EXAM: CT HEAD WITHOUT CONTRAST  CT CERVICAL SPINE WITHOUT CONTRAST  TECHNIQUE: Multidetector CT imaging of the head and cervical spine was performed following the standard protocol  without intravenous contrast. Multiplanar CT image reconstructions of the cervical spine were also generated.  COMPARISON:  01/15/2013 head CT. Scout view of the paranasal sinus CT 01/15/2013. Brain MR 08/17/2004.  FINDINGS: CT HEAD FINDINGS  No skull fracture or intracranial hemorrhage. Small vessel disease type changes without CT evidence of large acute infarct. Partial opacification right sphenoid sinus air cell and upper aspect of the right pterygoid plate containing hyperdense material which may represent inspissated material. No intracranial mass lesion noted on this unenhanced exam. No hydrocephalus.  CT CERVICAL SPINE FINDINGS  No cervical spine fracture noted.  Minimal anterior slip of C3 and C4 felt to be related to facet joint degenerative changes.  Minimal anterior widening of the posterior aspect of the  left C5-6 and C6-7 facet joint. This may be normal for this patient rather than related to injury however if there was a high clinical suspicion of ligamentous injury, MR or flexion and extension views could be obtained for further delineation.  C5-6 and C6-7 disc degeneration with broad-based spur causing mild spinal stenosis and cord flattening.  No abnormal prevertebral soft tissue swelling.  Dilated esophagus. Patient has an esophageal stent in place and this may be related to treatment of tumor although incompletely assessed.  IMPRESSION:  CT head:  No skull fracture or intracranial hemorrhage.  Small vessel disease type changes without CT evidence of large acute infarct.  Partial opacification right sphenoid sinus air cell and upper aspect of the right pterygoid plate containing hyperdense material which may represent inspissated material.  CT cervical spine:  No cervical spine fracture noted.  Minimal anterior slip of C3 and C4 felt to be related to facet joint degenerative changes.  Minimal anterior widening of the posterior aspect of the left C5-6 and C6-7 facet joint. This may be normal for this patient rather than related to injury however if there was a high clinical suspicion of ligamentous injury, MR or flexion and extension views could be obtained for further delineation.  C5-6 and C6-7 disc degeneration with broad-based spur causing mild spinal stenosis and cord flattening.  No abnormal prevertebral soft tissue swelling.  Dilated esophagus. Patient has an esophageal stent in place and this may be related to treatment of tumor although incompletely assessed.   Electronically Signed   By: Chauncey Cruel M.D.   On: 01/06/2014 14:05   Ct Cervical Spine Wo Contrast  01/06/2014   CLINICAL DATA:  Found down.  Fell in bathtub.  EXAM: CT HEAD WITHOUT CONTRAST  CT CERVICAL SPINE WITHOUT CONTRAST  TECHNIQUE: Multidetector CT imaging of the head and cervical spine was performed following the standard protocol  without intravenous contrast. Multiplanar CT image reconstructions of the cervical spine were also generated.  COMPARISON:  01/15/2013 head CT. Scout view of the paranasal sinus CT 01/15/2013. Brain MR 08/17/2004.  FINDINGS: CT HEAD FINDINGS  No skull fracture or intracranial hemorrhage. Small vessel disease type changes without CT evidence of large acute infarct. Partial opacification right sphenoid sinus air cell and upper aspect of the right pterygoid plate containing hyperdense material which may represent inspissated material. No intracranial mass lesion noted on this unenhanced exam. No hydrocephalus.  CT CERVICAL SPINE FINDINGS  No cervical spine fracture noted.  Minimal anterior slip of C3 and C4 felt to be related to facet joint degenerative changes.  Minimal anterior widening of the posterior aspect of the left C5-6 and C6-7 facet joint. This may be normal for this patient rather than related to injury however if there  was a high clinical suspicion of ligamentous injury, MR or flexion and extension views could be obtained for further delineation.  C5-6 and C6-7 disc degeneration with broad-based spur causing mild spinal stenosis and cord flattening.  No abnormal prevertebral soft tissue swelling.  Dilated esophagus. Patient has an esophageal stent in place and this may be related to treatment of tumor although incompletely assessed.  IMPRESSION:  CT head:  No skull fracture or intracranial hemorrhage.  Small vessel disease type changes without CT evidence of large acute infarct.  Partial opacification right sphenoid sinus air cell and upper aspect of the right pterygoid plate containing hyperdense material which may represent inspissated material.  CT cervical spine:  No cervical spine fracture noted.  Minimal anterior slip of C3 and C4 felt to be related to facet joint degenerative changes.  Minimal anterior widening of the posterior aspect of the left C5-6 and C6-7 facet joint. This may be normal for  this patient rather than related to injury however if there was a high clinical suspicion of ligamentous injury, MR or flexion and extension views could be obtained for further delineation.  C5-6 and C6-7 disc degeneration with broad-based spur causing mild spinal stenosis and cord flattening.  No abnormal prevertebral soft tissue swelling.  Dilated esophagus. Patient has an esophageal stent in place and this may be related to treatment of tumor although incompletely assessed.   Electronically Signed   By: Chauncey Cruel M.D.   On: 01/06/2014 14:05    ECG: AF with RVR, rate related widesread ST changes  TELEMETRY: AF converted to NSR around 8:30  IMPRESSION: 1. Recurrent AF with RVR in an elderly patient with falls and hemoptysis and tendency to hypotension.  RECOMMENDATION: 1. Continue amiodarone PO 200 mg BID x 2 weeks, then 200 mg daily after the infusion is complete 2. Check echo - questionable diastolic function abnormalities on previous study 3. Risk of warfarin/NOAC is prohibitive. ASA is contraindicated. Clopidogrel 75 mg daily may offer benefit in reduction of stroke risk.  Time Spent Directly with Patient: 60 minutes  Sanda Klein, MD, [facility] [phone] office [phone] pager   01/08/2014, 10:02 AM

## 2014-01-08 NOTE — Progress Notes (Addendum)
[facility] PROGRESS NOTE  [name] BJY:[id number] DOB: [birth year] DOA: 01/06/2014 PCP: Donia Ast, FNP  Assessment/Plan:  Principal Problem:   Fall at home: no focal deficits, but as patient back in atrial fibrillation, it is reasonable to do an MRI brain. Hold off on MRA for now. Occupational therapy is recommending CIR. Will consult. If not, SNF would be appropriate. Patient and niece agreeable to both options. Active Problems:   Atrial fibrillation with rapid ventricular response:  Slight improvement in rate after resuming digoxin and giving IV metoprolol. Borderline blood pressure precluded further use of metoprolol or Cardizem. Was started on amiodarone drip yesterday. Remains in atrial fibrillation and with rate about 110.  will consult cardiology. Patient is known to Dr. Johnsie Cancel. Not a good anticoagulation candidate due recurrent hemoptysis. Had to stop subcutaneous heparin yesterday, in fact. Will start aspirin and monitor closely. Labs pending.  received potassium and magnesium yesterday.  Awaiting picc line   Aspiration pneumonia:  continue Unasyn. Add levaquin for atypicals, as well. Allergic to emycin, doxy   ANEMIA-IRON DEFICIENCY   HYPERTENSION   HX OF ESOPHAGEAL CANCER   Esophageal stricture   Aortic regurgitation   Hx of esophagectomy   Unspecified asthma(493.90)   Chronic diastolic CHF (congestive heart failure), compensated    Pulmonary hypertension   Code Status:  DO NOT RESUSCITATE  Family Communication:   Niece 6/10 Disposition Plan:  inpatient rehabilitation versus skilled nursing facility   Consultants:  cardiology  Procedures:     Antibiotics:   levofloxacin 01/06/2014  unasyn 01/07/2014   HPI/Subjective: Complaining of rib pain from fall when she coughs or moves.  Objective: Filed Vitals:   01/08/14 0615  BP: 102/60  Pulse: 102  Temp: 97.7 F (36.5 C)  Resp: 20    Intake/Output Summary (Last 24 hours) at  01/08/14 0745 Last data filed at 01/08/14 0742  Gross per 24 hour  Intake 2437.92 ml  Output   1550 ml  Net 887.92 ml   Filed Weights   01/06/14 1824  Weight: 52.5 kg (115 lb 11.9 oz)    telemetry: Atrial fibrillation rate 110.  Exam:   General:   elderly frail. In oriented and appropriate. Coughing occasionally  Cardiovascular:  irregularly irregular.  No murmurs gallops rubs appreciated.   Respiratory:  Coarse with rhonchi. No wheeze or rales.  Abdomen:  soft nontender nondistended   Ext:  no edema    neurologic: Nonfocal.  Basic Metabolic Panel:  Recent Labs Lab 01/06/14 1402 01/07/14 1144  NA 138 136*  K 3.9 3.5*  CL 98 100  CO2 25 25  GLUCOSE 143* 140*  BUN 13 12  CREATININE 0.53 0.65  CALCIUM 9.1 7.9*  MG  --  1.5   Liver Function Tests:  Recent Labs Lab 01/06/14 1402  AST 41*  ALT 28  ALKPHOS 105  BILITOT 0.9  PROT 5.9*  ALBUMIN 2.6*   No results found for this basename: LIPASE, AMYLASE,  in the last 168 hours No results found for this basename: AMMONIA,  in the last 168 hours CBC:  Recent Labs Lab 01/06/14 1402 01/07/14 1144  WBC 22.4* 15.6*  NEUTROABS 20.0* 12.7*  HGB 13.4 11.4*  HCT 41.5 35.7*  MCV 78.0 77.9*  PLT 463* 426*   Cardiac Enzymes:  Recent Labs Lab 01/06/14 1402 01/07/14 1144  CKTOTAL 470* 206*  TROPONINI <0.30  --    BNP (last 3 results)  Recent Labs  02/25/13 1108  PROBNP 185.0*   CBG:  Recent Labs Lab 01/07/14 0622 01/07/14 1142 01/07/14 1658 01/07/14 2114 01/08/14 0613  GLUCAP 117* 129* 99 124* 89    Recent Results (from the past 240 hour(s))  URINE CULTURE     Status: None   Collection Time    01/06/14 12:33 PM      Result Value Ref Range Status   Specimen Description URINE, CLEAN CATCH   Final   Special Requests NONE   Final   Culture  Setup Time     Final   Value: 01/06/2014 18:50     Performed at [facility]     Final   Value: 75,000 COLONIES/ML      Performed at [facility]   Culture     Final   Value: ESCHERICHIA COLI     Performed at [facility]   Report Status PENDING   Incomplete  CULTURE, EXPECTORATED SPUTUM-ASSESSMENT     Status: None   Collection Time    01/07/14 10:36 AM      Result Value Ref Range Status   Specimen Description SPUTUM   Final   Special Requests NONE   Final   Sputum evaluation     Final   Value: THIS SPECIMEN IS ACCEPTABLE. RESPIRATORY CULTURE REPORT TO FOLLOW.   Report Status 01/07/2014 FINAL   Final  CULTURE, RESPIRATORY (NON-EXPECTORATED)     Status: None   Collection Time    01/07/14 10:36 AM      Result Value Ref Range Status   Specimen Description SPUTUM   Final   Special Requests NONE   Final   Gram Stain     Final   Value: FEW WBC PRESENT, PREDOMINANTLY PMN     NO SQUAMOUS EPITHELIAL CELLS SEEN     RARE GRAM POSITIVE COCCI     IN PAIRS     Performed at [facility]   Culture PENDING   Incomplete   Report Status PENDING   Incomplete     Studies: Dg Chest 2 View  01/06/2014   CLINICAL DATA:  Fall with pain in the mid to lower back.  EXAM: CHEST  2 VIEW  COMPARISON:  12/20/2013.  CT chest 09/12/2013.  FINDINGS: Surgical clips in the expected location of the thyroid. Trachea is midline. Heart is enlarged. Thoracic aorta is calcified. A long wall stent projects over the lower thoracic spine and traverses a gastric pull-through when compared with 09/12/2013. Patchy left basilar airspace disease. Small left pleural effusion. Osteopenia without definite compression fracture.  IMPRESSION: 1. Patchy left basilar airspace disease may be due to pneumonia. 2. Small left pleural effusion. 3. Osteopenia without definite fracture.   Electronically Signed   By: Lorin Picket M.D.   On: 01/06/2014 13:36   Dg Thoracic Spine 2 View  01/06/2014   CLINICAL DATA:  Fall.  Back pain.  EXAM: THORACIC SPINE - 2 VIEW  COMPARISON:  Lateral chest radiograph 12/20/2013  FINDINGS: No fracture. No  spondylolisthesis. There are mild disc degenerative changes along the mid lower thoracic spine. Bones are diffusely demineralized. Vena cava stent extends from the mid chest to the central upper abdomen.  IMPRESSION: No fracture or acute finding.   Electronically Signed   By: Lajean Manes M.D.   On: 01/06/2014 13:35   Dg Lumbar Spine Complete  01/06/2014   CLINICAL DATA:  81 year old female status post fall with pain. Initial encounter. History of esophagectomy with gastric pull-through and stenting  EXAM: LUMBAR SPINE -  COMPLETE 4+ VIEW  COMPARISON:  Abdominal 11/17/2011.  Chest CT 09/12/2013.  FINDINGS: Metallic stents in the distal stomach and right upper quadrant Stable from the recent CT. Stable right upper quadrant surgical clips. Multiple surgical clips projecting at the mediastinum and left lung base re- identified. Transitional lumbosacral anatomy, suspect L5 level being mostly sacralized. Anterolisthesis of L4 on L5 measuring up to 9 mm. Moderate to severe lower lumbar facet hypertrophy and degeneration. Relatively preserved disc spaces. Elsewhere lumbar vertebral height and alignment within normal limits. Grossly intact visualized lower thoracic levels. Sacral ala and SI joints within normal limits. Sclerosis at the pubic symphysis probably is degenerative.  IMPRESSION: 1. Transitional lumbosacral anatomy suspected with sacralized L5 level. Grade 1-2 anterolisthesis of L4 on L5 with advanced facet degeneration. 2.  No acute osseous abnormality identified in the lumbar spine. 3. Postoperative changes to the mediastinum with gastric pull-through metal stent extending into the right upper quadrant.   Electronically Signed   By: Lars Pinks M.D.   On: 01/06/2014 13:39   Ct Head Wo Contrast  01/06/2014   CLINICAL DATA:  Found down.  Fell in bathtub.  EXAM: CT HEAD WITHOUT CONTRAST  CT CERVICAL SPINE WITHOUT CONTRAST  TECHNIQUE: Multidetector CT imaging of the head and cervical spine was performed following  the standard protocol without intravenous contrast. Multiplanar CT image reconstructions of the cervical spine were also generated.  COMPARISON:  01/15/2013 head CT. Scout view of the paranasal sinus CT 01/15/2013. Brain MR 08/17/2004.  FINDINGS: CT HEAD FINDINGS  No skull fracture or intracranial hemorrhage. Small vessel disease type changes without CT evidence of large acute infarct. Partial opacification right sphenoid sinus air cell and upper aspect of the right pterygoid plate containing hyperdense material which may represent inspissated material. No intracranial mass lesion noted on this unenhanced exam. No hydrocephalus.  CT CERVICAL SPINE FINDINGS  No cervical spine fracture noted.  Minimal anterior slip of C3 and C4 felt to be related to facet joint degenerative changes.  Minimal anterior widening of the posterior aspect of the left C5-6 and C6-7 facet joint. This may be normal for this patient rather than related to injury however if there was a high clinical suspicion of ligamentous injury, MR or flexion and extension views could be obtained for further delineation.  C5-6 and C6-7 disc degeneration with broad-based spur causing mild spinal stenosis and cord flattening.  No abnormal prevertebral soft tissue swelling.  Dilated esophagus. Patient has an esophageal stent in place and this may be related to treatment of tumor although incompletely assessed.  IMPRESSION:  CT head:  No skull fracture or intracranial hemorrhage.  Small vessel disease type changes without CT evidence of large acute infarct.  Partial opacification right sphenoid sinus air cell and upper aspect of the right pterygoid plate containing hyperdense material which may represent inspissated material.  CT cervical spine:  No cervical spine fracture noted.  Minimal anterior slip of C3 and C4 felt to be related to facet joint degenerative changes.  Minimal anterior widening of the posterior aspect of the left C5-6 and C6-7 facet joint. This  may be normal for this patient rather than related to injury however if there was a high clinical suspicion of ligamentous injury, MR or flexion and extension views could be obtained for further delineation.  C5-6 and C6-7 disc degeneration with broad-based spur causing mild spinal stenosis and cord flattening.  No abnormal prevertebral soft tissue swelling.  Dilated esophagus. Patient has an esophageal stent in place  and this may be related to treatment of tumor although incompletely assessed.   Electronically Signed   By: Chauncey Cruel M.D.   On: 01/06/2014 14:05   Ct Cervical Spine Wo Contrast  01/06/2014   CLINICAL DATA:  Found down.  Fell in bathtub.  EXAM: CT HEAD WITHOUT CONTRAST  CT CERVICAL SPINE WITHOUT CONTRAST  TECHNIQUE: Multidetector CT imaging of the head and cervical spine was performed following the standard protocol without intravenous contrast. Multiplanar CT image reconstructions of the cervical spine were also generated.  COMPARISON:  01/15/2013 head CT. Scout view of the paranasal sinus CT 01/15/2013. Brain MR 08/17/2004.  FINDINGS: CT HEAD FINDINGS  No skull fracture or intracranial hemorrhage. Small vessel disease type changes without CT evidence of large acute infarct. Partial opacification right sphenoid sinus air cell and upper aspect of the right pterygoid plate containing hyperdense material which may represent inspissated material. No intracranial mass lesion noted on this unenhanced exam. No hydrocephalus.  CT CERVICAL SPINE FINDINGS  No cervical spine fracture noted.  Minimal anterior slip of C3 and C4 felt to be related to facet joint degenerative changes.  Minimal anterior widening of the posterior aspect of the left C5-6 and C6-7 facet joint. This may be normal for this patient rather than related to injury however if there was a high clinical suspicion of ligamentous injury, MR or flexion and extension views could be obtained for further delineation.  C5-6 and C6-7 disc  degeneration with broad-based spur causing mild spinal stenosis and cord flattening.  No abnormal prevertebral soft tissue swelling.  Dilated esophagus. Patient has an esophageal stent in place and this may be related to treatment of tumor although incompletely assessed.  IMPRESSION:  CT head:  No skull fracture or intracranial hemorrhage.  Small vessel disease type changes without CT evidence of large acute infarct.  Partial opacification right sphenoid sinus air cell and upper aspect of the right pterygoid plate containing hyperdense material which may represent inspissated material.  CT cervical spine:  No cervical spine fracture noted.  Minimal anterior slip of C3 and C4 felt to be related to facet joint degenerative changes.  Minimal anterior widening of the posterior aspect of the left C5-6 and C6-7 facet joint. This may be normal for this patient rather than related to injury however if there was a high clinical suspicion of ligamentous injury, MR or flexion and extension views could be obtained for further delineation.  C5-6 and C6-7 disc degeneration with broad-based spur causing mild spinal stenosis and cord flattening.  No abnormal prevertebral soft tissue swelling.  Dilated esophagus. Patient has an esophageal stent in place and this may be related to treatment of tumor although incompletely assessed.   Electronically Signed   By: Chauncey Cruel M.D.   On: 01/06/2014 14:05    Scheduled Meds: . ampicillin-sulbactam (UNASYN) IV  3 g Intravenous Q8H  . arformoterol  15 mcg Nebulization BID  . budesonide  0.25 mg Nebulization BID  . dextromethorphan-guaiFENesin  1 tablet Oral BID  . digoxin  0.125 mg Oral Daily  . feeding supplement (ENSURE COMPLETE)  237 mL Oral Q lunch  . furosemide  40 mg Oral Daily  . levofloxacin (LEVAQUIN) IV  750 mg Intravenous Once  . metoCLOPramide  5 mg Oral BID  . olopatadine  1 drop Both Eyes BID  . pantoprazole  40 mg Oral BID  . sucralfate  1 g Oral BID  . Vitamin  D (Ergocalciferol)  50,000 Units Oral Q7 days  .  zolpidem  5 mg Oral QHS   Continuous Infusions: . sodium chloride    . sodium chloride 125 mL/hr at 01/08/14 0001  . amiodarone 30 mg/hr (01/08/14 0001)    Time spent: 35 minutes  Delfina Redwood, MD [facility] Pager [phone]. If 7PM-7AM, please contact night-coverage at [email or web address], password [facility] 01/08/2014, 7:45 AM  LOS: 2 days

## 2014-01-08 NOTE — Progress Notes (Signed)
Pharmacy: pharmacy may adjust abx for renal function  Patient is an 80 y.o F on abx for PNA.  Scr 0.63 (crcl~36).  6/9 unasyn>> 6/8 LVQ>>  6/8 bcx x2>> ngtd 6/9 sputum>> GPC, GNR 6/8 ucx>> 75K Ecoli-- sens pending   Plan: 1) will adjust levaquin to 250mg  IV q24h 2) cont unasyn 3gm IV q8h 3) will adjust abx doses if/when appropriate

## 2014-01-09 ENCOUNTER — Inpatient Hospital Stay (HOSPITAL_COMMUNITY)
Admission: RE | Admit: 2014-01-09 | Discharge: 2014-01-18 | DRG: 945 | Disposition: A | Discharge status: Home Health | Payer: Medicare Other | Source: Intra-hospital | Attending: Physical Medicine & Rehabilitation | Admitting: Physical Medicine & Rehabilitation

## 2014-01-09 DIAGNOSIS — R350 Frequency of micturition: Secondary | ICD-10-CM | POA: Diagnosis not present

## 2014-01-09 DIAGNOSIS — I10 Essential (primary) hypertension: Secondary | ICD-10-CM | POA: Diagnosis present

## 2014-01-09 DIAGNOSIS — M810 Age-related osteoporosis without current pathological fracture: Secondary | ICD-10-CM | POA: Diagnosis present

## 2014-01-09 DIAGNOSIS — J690 Pneumonitis due to inhalation of food and vomit: Secondary | ICD-10-CM | POA: Diagnosis present

## 2014-01-09 DIAGNOSIS — I4891 Unspecified atrial fibrillation: Secondary | ICD-10-CM | POA: Diagnosis present

## 2014-01-09 DIAGNOSIS — R5381 Other malaise: Secondary | ICD-10-CM

## 2014-01-09 DIAGNOSIS — J45909 Unspecified asthma, uncomplicated: Secondary | ICD-10-CM | POA: Diagnosis present

## 2014-01-09 DIAGNOSIS — K3184 Gastroparesis: Secondary | ICD-10-CM | POA: Diagnosis present

## 2014-01-09 DIAGNOSIS — K222 Esophageal obstruction: Secondary | ICD-10-CM

## 2014-01-09 DIAGNOSIS — Z9181 History of falling: Secondary | ICD-10-CM

## 2014-01-09 DIAGNOSIS — R2689 Other abnormalities of gait and mobility: Secondary | ICD-10-CM | POA: Diagnosis present

## 2014-01-09 DIAGNOSIS — J42 Unspecified chronic bronchitis: Secondary | ICD-10-CM | POA: Diagnosis present

## 2014-01-09 DIAGNOSIS — E876 Hypokalemia: Secondary | ICD-10-CM | POA: Diagnosis not present

## 2014-01-09 DIAGNOSIS — C159 Malignant neoplasm of esophagus, unspecified: Secondary | ICD-10-CM | POA: Diagnosis present

## 2014-01-09 DIAGNOSIS — K219 Gastro-esophageal reflux disease without esophagitis: Secondary | ICD-10-CM | POA: Diagnosis present

## 2014-01-09 DIAGNOSIS — R197 Diarrhea, unspecified: Secondary | ICD-10-CM | POA: Diagnosis present

## 2014-01-09 DIAGNOSIS — A498 Other bacterial infections of unspecified site: Secondary | ICD-10-CM | POA: Diagnosis present

## 2014-01-09 DIAGNOSIS — N390 Urinary tract infection, site not specified: Secondary | ICD-10-CM | POA: Diagnosis present

## 2014-01-09 DIAGNOSIS — Z5189 Encounter for other specified aftercare: Principal | ICD-10-CM

## 2014-01-09 LAB — BASIC METABOLIC PANEL
BUN: 5 mg/dL — ABNORMAL LOW (ref 6–23)
CALCIUM: 8.6 mg/dL (ref 8.4–10.5)
CHLORIDE: 107 meq/L (ref 96–112)
CO2: 26 mEq/L (ref 19–32)
Creatinine, Ser: 0.61 mg/dL (ref 0.50–1.10)
GFR calc Af Amer: >90 mL/min (ref >90)
GFR calc non Af Amer: 82 mL/min — ABNORMAL LOW (ref >90)
Glucose, Bld: 102 mg/dL — ABNORMAL HIGH (ref 70–99)
POTASSIUM: 3.7 meq/L (ref 3.7–5.3)
Sodium: 143 mEq/L (ref 137–147)

## 2014-01-09 LAB — CULTURE, RESPIRATORY

## 2014-01-09 LAB — CULTURE, RESPIRATORY W GRAM STAIN

## 2014-01-09 LAB — GLUCOSE, CAPILLARY
GLUCOSE-CAPILLARY: 88 mg/dL (ref 70–99)
GLUCOSE-CAPILLARY: 109 mg/dL — AB (ref 70–99)
GLUCOSE-CAPILLARY: 94 mg/dL (ref 70–99)
Glucose-Capillary: 110 mg/dL — ABNORMAL HIGH (ref 70–99)

## 2014-01-09 MED ORDER — ACETAMINOPHEN 325 MG PO TABS
650.0000 mg | ORAL_TABLET | Freq: Three times a day (TID) | ORAL | Status: DC
  Administered 2014-01-09 – 2014-01-18 (×24): 650 mg via ORAL
  Filled 2014-01-09 (×25): qty 2

## 2014-01-09 MED ORDER — ENSURE COMPLETE PO LIQD: 237.0000 mL | Freq: Every day | ORAL | Status: DC

## 2014-01-09 MED ORDER — OLOPATADINE HCL 0.1 % OP SOLN
1.0000 [drp] | Freq: Two times a day (BID) | OPHTHALMIC | Status: DC
  Administered 2014-01-09 – 2014-01-18 (×18): 1 [drp] via OPHTHALMIC
  Filled 2014-01-09: qty 5

## 2014-01-09 MED ORDER — PANTOPRAZOLE SODIUM 40 MG PO TBEC
40.0000 mg | DELAYED_RELEASE_TABLET | Freq: Two times a day (BID) | ORAL | Status: DC
  Administered 2014-01-09 – 2014-01-18 (×18): 40 mg via ORAL
  Filled 2014-01-09 (×21): qty 1

## 2014-01-09 MED ORDER — DIPHENHYDRAMINE HCL 12.5 MG/5ML PO ELIX: 12.5000 mg | ORAL_SOLUTION | Freq: Four times a day (QID) | ORAL | Status: DC | PRN

## 2014-01-09 MED ORDER — PROCHLORPERAZINE MALEATE 5 MG PO TABS
5.0000 mg | ORAL_TABLET | Freq: Four times a day (QID) | ORAL | Status: DC | PRN
  Administered 2014-01-10 – 2014-01-13 (×4): 10 mg via ORAL
  Administered 2014-01-15 (×2): 5 mg via ORAL
  Administered 2014-01-17 – 2014-01-18 (×2): 10 mg via ORAL
  Filled 2014-01-09 (×7): qty 2

## 2014-01-09 MED ORDER — GUAIFENESIN-DM 100-10 MG/5ML PO SYRP
5.0000 mL | ORAL_SOLUTION | Freq: Four times a day (QID) | ORAL | Status: DC | PRN
  Administered 2014-01-09 – 2014-01-16 (×5): 10 mL via ORAL
  Filled 2014-01-09 (×5): qty 10

## 2014-01-09 MED ORDER — PANTOPRAZOLE SODIUM 40 MG PO TBEC: 40.0000 mg | DELAYED_RELEASE_TABLET | Freq: Two times a day (BID) | ORAL | Status: DC

## 2014-01-09 MED ORDER — TRAZODONE HCL 50 MG PO TABS
25.0000 mg | ORAL_TABLET | Freq: Every evening | ORAL | Status: DC | PRN
  Administered 2014-01-10: 50 mg via ORAL
  Filled 2014-01-09: qty 1

## 2014-01-09 MED ORDER — DM-GUAIFENESIN ER 30-600 MG PO TB12: 0.5000 | ORAL_TABLET | Freq: Two times a day (BID) | ORAL | Status: DC | PRN

## 2014-01-09 MED ORDER — AMIODARONE HCL 200 MG PO TABS
200.0000 mg | ORAL_TABLET | Freq: Two times a day (BID) | ORAL | Status: DC
  Administered 2014-01-09 – 2014-01-18 (×18): 200 mg via ORAL
  Filled 2014-01-09 (×21): qty 1

## 2014-01-09 MED ORDER — SODIUM CHLORIDE 0.9 % IV SOLN: 3.0000 g | Freq: Three times a day (TID) | INTRAVENOUS | Status: DC

## 2014-01-09 MED ORDER — AMIODARONE HCL 200 MG PO TABS
200.0000 mg | ORAL_TABLET | Freq: Two times a day (BID) | ORAL | Status: DC
  Administered 2014-01-09: 200 mg via ORAL
  Filled 2014-01-09 (×2): qty 1

## 2014-01-09 MED ORDER — VITAMIN D (ERGOCALCIFEROL) 1.25 MG (50000 UNIT) PO CAPS
50000.0000 [IU] | ORAL_CAPSULE | ORAL | Status: DC
  Administered 2014-01-14: 50000 [IU] via ORAL
  Filled 2014-01-09: qty 1

## 2014-01-09 MED ORDER — DEXTROSE 5 % IV SOLN
1.0000 g | INTRAVENOUS | Status: DC
  Administered 2014-01-09 – 2014-01-13 (×5): 1 g via INTRAVENOUS
  Filled 2014-01-09 (×7): qty 10

## 2014-01-09 MED ORDER — AMIODARONE HCL 200 MG PO TABS: 200.0000 mg | ORAL_TABLET | Freq: Two times a day (BID) | ORAL | Status: DC

## 2014-01-09 MED ORDER — LEVALBUTEROL HCL 0.63 MG/3ML IN NEBU
0.6300 mg | INHALATION_SOLUTION | RESPIRATORY_TRACT | Status: DC | PRN
  Filled 2014-01-09: qty 3

## 2014-01-09 MED ORDER — BUDESONIDE 0.25 MG/2ML IN SUSP
0.2500 mg | Freq: Two times a day (BID) | RESPIRATORY_TRACT | Status: DC
  Administered 2014-01-09 – 2014-01-18 (×14): 0.25 mg via RESPIRATORY_TRACT
  Filled 2014-01-09 (×19): qty 2

## 2014-01-09 MED ORDER — SUCRALFATE 1 GM/10ML PO SUSP
1.0000 g | Freq: Two times a day (BID) | ORAL | Status: DC
  Administered 2014-01-09 – 2014-01-18 (×18): 1 g via ORAL
  Filled 2014-01-09 (×21): qty 10

## 2014-01-09 MED ORDER — SUCRALFATE 1 GM/10ML PO SUSP: 1.0000 g | Freq: Two times a day (BID) | ORAL | Status: DC

## 2014-01-09 MED ORDER — PROCHLORPERAZINE 25 MG RE SUPP
12.5000 mg | Freq: Four times a day (QID) | RECTAL | Status: DC | PRN
  Filled 2014-01-09: qty 1

## 2014-01-09 MED ORDER — DIGOXIN 125 MCG PO TABS
0.1250 mg | ORAL_TABLET | Freq: Every day | ORAL | Status: DC
  Administered 2014-01-10 – 2014-01-18 (×9): 0.125 mg via ORAL
  Filled 2014-01-09 (×10): qty 1

## 2014-01-09 MED ORDER — AMIODARONE HCL 200 MG PO TABS: 200.0000 mg | ORAL_TABLET | Freq: Every day | ORAL | Status: DC

## 2014-01-09 MED ORDER — HYDROCOD POLST-CHLORPHEN POLST 10-8 MG/5ML PO LQCR
5.0000 mL | Freq: Two times a day (BID) | ORAL | Status: DC | PRN
  Administered 2014-01-10 – 2014-01-15 (×5): 5 mL via ORAL
  Filled 2014-01-09 (×5): qty 5

## 2014-01-09 MED ORDER — LEVOFLOXACIN IN D5W 250 MG/50ML IV SOLN: 250.0000 mg | INTRAVENOUS | Status: DC

## 2014-01-09 MED ORDER — METOCLOPRAMIDE HCL 5 MG PO TABS: 5.0000 mg | ORAL_TABLET | Freq: Two times a day (BID) | ORAL | Status: DC

## 2014-01-09 MED ORDER — METOCLOPRAMIDE HCL 5 MG PO TABS
5.0000 mg | ORAL_TABLET | Freq: Two times a day (BID) | ORAL | Status: DC
  Administered 2014-01-09 – 2014-01-14 (×10): 5 mg via ORAL
  Filled 2014-01-09 (×12): qty 1

## 2014-01-09 MED ORDER — PROCHLORPERAZINE EDISYLATE 5 MG/ML IJ SOLN
5.0000 mg | Freq: Four times a day (QID) | INTRAMUSCULAR | Status: DC | PRN
  Filled 2014-01-09 (×2): qty 2

## 2014-01-09 MED ORDER — POTASSIUM CHLORIDE 10 MEQ/100ML IV SOLN
10.0000 meq | INTRAVENOUS | Status: AC
  Administered 2014-01-09 (×2): 10 meq via INTRAVENOUS
  Filled 2014-01-09 (×2): qty 100

## 2014-01-09 MED ORDER — ARFORMOTEROL TARTRATE 15 MCG/2ML IN NEBU
15.0000 ug | INHALATION_SOLUTION | Freq: Two times a day (BID) | RESPIRATORY_TRACT | Status: DC
  Administered 2014-01-09 – 2014-01-18 (×13): 15 ug via RESPIRATORY_TRACT
  Filled 2014-01-09 (×20): qty 2

## 2014-01-09 MED ORDER — ALUM & MAG HYDROXIDE-SIMETH 200-200-20 MG/5ML PO SUSP: 30.0000 mL | ORAL | Status: DC | PRN

## 2014-01-09 MED ORDER — TRAMADOL HCL 50 MG PO TABS
50.0000 mg | ORAL_TABLET | Freq: Three times a day (TID) | ORAL | Status: DC | PRN
  Administered 2014-01-12 – 2014-01-17 (×5): 50 mg via ORAL
  Filled 2014-01-09 (×5): qty 1

## 2014-01-09 MED ORDER — FUROSEMIDE 40 MG PO TABS
40.0000 mg | ORAL_TABLET | Freq: Every day | ORAL | Status: DC
  Administered 2014-01-10 – 2014-01-18 (×9): 40 mg via ORAL
  Filled 2014-01-09 (×12): qty 1

## 2014-01-09 NOTE — Discharge Summary (Signed)
Physician Discharge Summary  [name] KXF:[id number] DOB: November 17, 1928 DOA: 01/06/2014  PCP: Donia Ast, FNP  Admit date: 01/06/2014 Discharge date: 01/09/2014  Time spent: greater than 30 minutes  Recommendations for Outpatient Follow-up:  1. To inpatient rehab  Discharge Diagnoses:  Principal Problem:   Fall at home Active Problems:   ANEMIA-IRON DEFICIENCY   HYPERTENSION   HX OF ESOPHAGEAL CANCER   Esophageal stricture   Atrial fibrillation with rapid ventricular response   Aortic regurgitation   Aspiration pneumonia   Hx of esophagectomy   Anticoagulant long-term use   Unspecified asthma(493.90)   Chronic diastolic CHF (congestive heart failure)   Pulmonary hypertension  Discharge Condition: stable  Code status DNR  Filed Weights   01/06/14 1824  Weight: 52.5 kg (115 lb 11.9 oz)    History of present illness:  80 y.o. female came to ED 01/06/2014. SHe carries a history of hx of esophageal cancer + gastric outlet syndrome w/ prev. Esophagogastrectomy ~ 1993 with gastric pull-through-complications of hematemesis 09/09/13, prior gastroparesis, recurrent admissions for Aspiration Pna and multiple endoscopic interventions, chronic [name] presented to [facility] today after being found down in her bathroom for an unknown period of time.  She is a relatively good historian and history is supplemented by her niece [name] who is her health care power of attorney.  She has been sick and congestion for the past one to 2 weeks and recently saw Dr. Melvyn Novas of pulmonary medicine on 5/22, 5/13 2014-on the last office visit as there was no purulent sputum or acute changes on chest x-ray, patient was not prescribed any antibiotics at that time.  She was noted to have hemoptysis and Coumadin was discontinued. A chest x-ray done on 5/22 showed stable chest with severe cardiomegaly no CHF.  Her niece reports that she went to see the patient yesterday and she was having  increasing congestion. She allegedly went to the bathroom and had a fall. She cannot tell me what exactly transpired that time that she fell. She states that she was weak in her feet. When I ask her negative history of dizziness or room spinning around her she says she did not have any of those specific symptoms felt weak in her legs.  Her family and her niece confirm that she's been eating less than usual and has a very specific way of eating so as to not aspirate. Did mention also that she she's been off and on antibiotics chronically and every time she seems come off antibiotics she has recurrence of what appears to be aspiration pneumonia.   Hospital Course:   Fall at home: no focal deficits. CT brain shows nothing acute. Has worked with physical therapy occupational therapy and will go to inpatient rehabilitation.  Pneumonia, likely aspiration related: Has improved on Unasyn.  levaquin added for atypicals. Allergic to emycin, doxy. Patient had poor IV access and required PICC line placement. Due to swallowing issues an extensive GI history, had continued IV antibiotics. Deferred to PM & R whether to transition to oral medication. Would treat for 8 days total, which would be 01/13/2014  During hospitalization, developed Atrial fibrillation with rapid ventricular response: Has had this in the past. Did not respond to low-dose metoprolol or digoxin. Blood pressure too low to increase beta blocker or add Cardizem drip, so was started on amiodarone drip. Patient is known to Dr. Johnsie Cancel. Cardiology was consulted and agreed with amiodarone. She has remained in sinus rhythm for over 24 hours and  has been converted to oral amiodarone. She will need 20 mg twice a day for 2 weeks then 200 mg daily. She will need outpatient followup to assess further need for same. Coumadin should not be resumed due to fall risk and hemoptysis. Aspirin was not started due to her extensive GI history. May consider Plavix as an  outpatient for stroke prophylaxis. Defer to cardiology.  ANEMIA-IRON DEFICIENCY   HYPERTENSION   HX OF ESOPHAGEAL CANCER   Esophageal stricture   Aortic regurgitation   Hx of esophagectomy   Unspecified asthma(493.90)   Chronic diastolic CHF (congestive heart failure), compensated   Pulmonary hypertension  Bacteriuria: Patient had no urinary symptoms, UA was unimpressive and urine culture less than 100,000 colonies of Escherichia coli. No evidence of active UTI, likely colonization.  Procedures:  PICC line  Consultations:  Physical medicine and rehabilitation  Discharge Exam: Filed Vitals:   01/09/14 1015  BP:   Pulse: 86  Temp:   Resp:    Telemetry: Normal sinus rhythm General: In chair. Bright and talkative. Oriented and cooperative. Cardiovascular: Regular rate rhythm without murmurs gallops rubs Respiratory: Rhonchi bilaterally abdomen soft nontender nondistended Extremities no clubbing cyanosis or edema   Discharge Instructions   Walk with assistance    Complete by:  As directed             Medication List    STOP taking these medications       lansoprazole 30 MG disintegrating tablet  Commonly known as:  PREVACID SOLUTAB  Replaced by:  pantoprazole 40 MG tablet      TAKE these medications       acetaminophen 500 MG tablet  Commonly known as:  TYLENOL  Take 1 tablet (500 mg total) by mouth every 6 (six) hours as needed for headache.     albuterol 108 (90 BASE) MCG/ACT inhaler  Commonly known as:  PROVENTIL HFA;VENTOLIN HFA  Inhale 2 puffs into the lungs every 6 (six) hours as needed. For wheezing     albuterol (2.5 MG/3ML) 0.083% nebulizer solution  Commonly known as:  PROVENTIL  Take 3 mLs (2.5 mg total) by nebulization every 4 (four) hours as needed for wheezing or shortness of breath.     amiodarone 200 MG tablet  Commonly known as:  PACERONE  Take 1 tablet (200 mg total) by mouth 2 (two) times daily. For 2 weeks, then once daily      Ampicillin-Sulbactam 3 g in sodium chloride 0.9 % 100 mL  Inject 3 g into the vein every 8 (eight) hours. Through 01/13/14     arformoterol 15 MCG/2ML Nebu  Commonly known as:  BROVANA  Take 2 mLs (15 mcg total) by nebulization 2 (two) times daily. Dx: 493.00     B-12 5000 MCG Tbdp  Take 1 tablet by mouth daily.     budesonide 0.25 MG/2ML nebulizer solution  Commonly known as:  PULMICORT  Take 2 mLs (0.25 mg total) by nebulization 2 (two) times daily. Dx: 493.00     chlorpheniramine-HYDROcodone 10-8 MG/5ML Lqcr  Commonly known as:  TUSSIONEX PENNKINETIC ER  Take 5 mLs by mouth every 12 (twelve) hours as needed for cough.     dextromethorphan-guaiFENesin 30-600 MG per 12 hr tablet  Commonly known as:  MUCINEX DM  Take 1 tablet by mouth 2 (two) times daily as needed for cough.     digoxin 0.125 MG tablet  Commonly known as:  LANOXIN  Take 1 tablet (0.125 mg total) by mouth daily.  feeding supplement (ENSURE COMPLETE) Liqd  Take 237 mLs by mouth daily with lunch.     furosemide 40 MG tablet  Commonly known as:  LASIX  Take 1 tablet (40 mg total) by mouth daily.     HEMOCYTE PLUS 106-1 MG Caps  Take 1 tablet by mouth 2 (two) times daily.     Levofloxacin 250 MG/50ML Soln  Commonly known as:  LEVAQUIN  Inject 50 mLs (250 mg total) into the vein daily. Through 01/13/14     metoCLOPramide 5 MG tablet  Commonly known as:  REGLAN  Take 1 tablet (5 mg total) by mouth 2 (two) times daily before a meal.     pantoprazole 40 MG tablet  Commonly known as:  PROTONIX  Take 1 tablet (40 mg total) by mouth 2 (two) times daily before a meal.     PATADAY 0.2 % Soln  Generic drug:  Olopatadine HCl  Apply 1-2 drops to eye 2 (two) times daily as needed (dry, itchy eyes.).     promethazine 25 MG suppository  Commonly known as:  PHENERGAN  Place 25 mg rectally every 6 (six) hours as needed for nausea or vomiting.     sucralfate 1 GM/10ML suspension  Commonly known as:  CARAFATE   Take 10 mLs (1 g total) by mouth 2 (two) times daily before a meal.     traMADol 50 MG tablet  Commonly known as:  ULTRAM  Take 1 tablet (50 mg total) by mouth every 8 (eight) hours as needed for moderate pain.     VIACTIV MULTI-VITAMIN Chew  Chew 1 each by mouth daily.     Vitamin D (Ergocalciferol) 50000 UNITS Caps capsule  Commonly known as:  DRISDOL  Take 1 capsule (50,000 Units total) by mouth every 7 (seven) days. On Friday     zolpidem 5 MG tablet  Commonly known as:  AMBIEN  Take 5 mg by mouth at bedtime.       Allergies  Allergen Reactions  . Erythromycin Ethylsuccinate     Irregular pulse rate  . Prednisone     "makes me hyper"  . Doxycycline Rash       Follow-up Information   Follow up with Jenkins Rouge, MD In 3 weeks.   Specialty:  Cardiology   Contact information:   [address] [place] [address] [phone]        The results of significant diagnostics from this hospitalization (including imaging, microbiology, ancillary and laboratory) are listed below for reference.    Significant Diagnostic Studies: Dg Chest 2 View  01/06/2014   CLINICAL DATA:  Fall with pain in the mid to lower back.  EXAM: CHEST  2 VIEW  COMPARISON:  12/20/2013.  CT chest 09/12/2013.  FINDINGS: Surgical clips in the expected location of the thyroid. Trachea is midline. Heart is enlarged. Thoracic aorta is calcified. A long wall stent projects over the lower thoracic spine and traverses a gastric pull-through when compared with 09/12/2013. Patchy left basilar airspace disease. Small left pleural effusion. Osteopenia without definite compression fracture.  IMPRESSION: 1. Patchy left basilar airspace disease may be due to pneumonia. 2. Small left pleural effusion. 3. Osteopenia without definite fracture.   Electronically Signed   By: Lorin Picket M.D.   On: 01/06/2014 13:36   Dg Chest 2 View  12/20/2013   CLINICAL DATA:  Pneumonia.  Cough.  EXAM: CHEST  2 VIEW   COMPARISON:  DG CHEST 2 VIEW dated 12/02/2013; CT  CHEST W/CM dated 09/12/2013; DG CHEST 2 VIEW dated 10/14/2013  FINDINGS: Mediastinum and hilar structures normal. Severe cardiomegaly with normal pulmonary vascularity. Thoracic aorta is tortuous. There is a gastric pull-through with stent present. Surgical clips noted throughout the neck and chest.  IMPRESSION: 1. Stable chest with severe cardiomegaly. No CHF. No focal infiltrate. 2. There is a gastric pull through with stent present. This appears stable.   Electronically Signed   By: Marcello Moores  Register   On: 12/20/2013 14:17   Dg Thoracic Spine 2 View  01/06/2014   CLINICAL DATA:  Fall.  Back pain.  EXAM: THORACIC SPINE - 2 VIEW  COMPARISON:  Lateral chest radiograph 12/20/2013  FINDINGS: No fracture. No spondylolisthesis. There are mild disc degenerative changes along the mid lower thoracic spine. Bones are diffusely demineralized. Vena cava stent extends from the mid chest to the central upper abdomen.  IMPRESSION: No fracture or acute finding.   Electronically Signed   By: Lajean Manes M.D.   On: 01/06/2014 13:35   Dg Lumbar Spine Complete  01/06/2014   CLINICAL DATA:  80 year old female status post fall with pain. Initial encounter. History of esophagectomy with gastric pull-through and stenting  EXAM: LUMBAR SPINE - COMPLETE 4+ VIEW  COMPARISON:  Abdominal 11/17/2011.  Chest CT 09/12/2013.  FINDINGS: Metallic stents in the distal stomach and right upper quadrant Stable from the recent CT. Stable right upper quadrant surgical clips. Multiple surgical clips projecting at the mediastinum and left lung base re- identified. Transitional lumbosacral anatomy, suspect L5 level being mostly sacralized. Anterolisthesis of L4 on L5 measuring up to 9 mm. Moderate to severe lower lumbar facet hypertrophy and degeneration. Relatively preserved disc spaces. Elsewhere lumbar vertebral height and alignment within normal limits. Grossly intact visualized lower thoracic levels.  Sacral ala and SI joints within normal limits. Sclerosis at the pubic symphysis probably is degenerative.  IMPRESSION: 1. Transitional lumbosacral anatomy suspected with sacralized L5 level. Grade 1-2 anterolisthesis of L4 on L5 with advanced facet degeneration. 2.  No acute osseous abnormality identified in the lumbar spine. 3. Postoperative changes to the mediastinum with gastric pull-through metal stent extending into the right upper quadrant.   Electronically Signed   By: Lars Pinks M.D.   On: 01/06/2014 13:39   Ct Head Wo Contrast  01/06/2014   CLINICAL DATA:  Found down.  Fell in bathtub.  EXAM: CT HEAD WITHOUT CONTRAST  CT CERVICAL SPINE WITHOUT CONTRAST  TECHNIQUE: Multidetector CT imaging of the head and cervical spine was performed following the standard protocol without intravenous contrast. Multiplanar CT image reconstructions of the cervical spine were also generated.  COMPARISON:  01/15/2013 head CT. Scout view of the paranasal sinus CT 01/15/2013. Brain MR 08/17/2004.  FINDINGS: CT HEAD FINDINGS  No skull fracture or intracranial hemorrhage. Small vessel disease type changes without CT evidence of large acute infarct. Partial opacification right sphenoid sinus air cell and upper aspect of the right pterygoid plate containing hyperdense material which may represent inspissated material. No intracranial mass lesion noted on this unenhanced exam. No hydrocephalus.  CT CERVICAL SPINE FINDINGS  No cervical spine fracture noted.  Minimal anterior slip of C3 and C4 felt to be related to facet joint degenerative changes.  Minimal anterior widening of the posterior aspect of the left C5-6 and C6-7 facet joint. This may be normal for this patient rather than related to injury however if there was a high clinical suspicion of ligamentous injury, MR or flexion and extension views could be obtained  for further delineation.  C5-6 and C6-7 disc degeneration with broad-based spur causing mild spinal stenosis and  cord flattening.  No abnormal prevertebral soft tissue swelling.  Dilated esophagus. Patient has an esophageal stent in place and this may be related to treatment of tumor although incompletely assessed.  IMPRESSION:  CT head:  No skull fracture or intracranial hemorrhage.  Small vessel disease type changes without CT evidence of large acute infarct.  Partial opacification right sphenoid sinus air cell and upper aspect of the right pterygoid plate containing hyperdense material which may represent inspissated material.  CT cervical spine:  No cervical spine fracture noted.  Minimal anterior slip of C3 and C4 felt to be related to facet joint degenerative changes.  Minimal anterior widening of the posterior aspect of the left C5-6 and C6-7 facet joint. This may be normal for this patient rather than related to injury however if there was a high clinical suspicion of ligamentous injury, MR or flexion and extension views could be obtained for further delineation.  C5-6 and C6-7 disc degeneration with broad-based spur causing mild spinal stenosis and cord flattening.  No abnormal prevertebral soft tissue swelling.  Dilated esophagus. Patient has an esophageal stent in place and this may be related to treatment of tumor although incompletely assessed.   Electronically Signed   By: Chauncey Cruel M.D.   On: 01/06/2014 14:05   Ct Cervical Spine Wo Contrast  01/06/2014   CLINICAL DATA:  Found down.  Fell in bathtub.  EXAM: CT HEAD WITHOUT CONTRAST  CT CERVICAL SPINE WITHOUT CONTRAST  TECHNIQUE: Multidetector CT imaging of the head and cervical spine was performed following the standard protocol without intravenous contrast. Multiplanar CT image reconstructions of the cervical spine were also generated.  COMPARISON:  01/15/2013 head CT. Scout view of the paranasal sinus CT 01/15/2013. Brain MR 08/17/2004.  FINDINGS: CT HEAD FINDINGS  No skull fracture or intracranial hemorrhage. Small vessel disease type changes without CT  evidence of large acute infarct. Partial opacification right sphenoid sinus air cell and upper aspect of the right pterygoid plate containing hyperdense material which may represent inspissated material. No intracranial mass lesion noted on this unenhanced exam. No hydrocephalus.  CT CERVICAL SPINE FINDINGS  No cervical spine fracture noted.  Minimal anterior slip of C3 and C4 felt to be related to facet joint degenerative changes.  Minimal anterior widening of the posterior aspect of the left C5-6 and C6-7 facet joint. This may be normal for this patient rather than related to injury however if there was a high clinical suspicion of ligamentous injury, MR or flexion and extension views could be obtained for further delineation.  C5-6 and C6-7 disc degeneration with broad-based spur causing mild spinal stenosis and cord flattening.  No abnormal prevertebral soft tissue swelling.  Dilated esophagus. Patient has an esophageal stent in place and this may be related to treatment of tumor although incompletely assessed.  IMPRESSION:  CT head:  No skull fracture or intracranial hemorrhage.  Small vessel disease type changes without CT evidence of large acute infarct.  Partial opacification right sphenoid sinus air cell and upper aspect of the right pterygoid plate containing hyperdense material which may represent inspissated material.  CT cervical spine:  No cervical spine fracture noted.  Minimal anterior slip of C3 and C4 felt to be related to facet joint degenerative changes.  Minimal anterior widening of the posterior aspect of the left C5-6 and C6-7 facet joint. This may be normal for this patient rather  than related to injury however if there was a high clinical suspicion of ligamentous injury, MR or flexion and extension views could be obtained for further delineation.  C5-6 and C6-7 disc degeneration with broad-based spur causing mild spinal stenosis and cord flattening.  No abnormal prevertebral soft tissue  swelling.  Dilated esophagus. Patient has an esophageal stent in place and this may be related to treatment of tumor although incompletely assessed.   Electronically Signed   By: Chauncey Cruel M.D.   On: 01/06/2014 14:05   Dg Chest Port 1 View  01/08/2014   CLINICAL DATA:  PICC placement.  Recurrent atrial fibrillation.  EXAM: PORTABLE CHEST - 1 VIEW  COMPARISON:  01/06/2014  FINDINGS: PICC has been inserted and the tip is at the level of the carina in the superior vena cava in good position.  There is persistent prominence of the cardiomediastinal silhouette. The patient has a known gastric pull-through with gastroesophageal stent due to esophageal cancer.  The area of infiltrate/atelectasis at the left lung base has slightly increased and could represent pneumonia or aspiration pneumonitis.  The pulmonary vascularity is normal. Right lung is clear. No acute osseous abnormalities.  IMPRESSION: 1. PICC is in good position with the tip at the level of the carina. 2. Slight increased atelectasis/infiltrate at the left lung base as described above.   Electronically Signed   By: Rozetta Nunnery M.D.   On: 01/08/2014 15:19   Echo Left ventricle: The cavity size was normal. Wall thickness was increased in a pattern of moderate LVH. Systolic function was normal. The estimated ejection fraction was in the range of 60% to 65%. - Aortic valve: There was mild to moderate regurgitation. - Mitral valve: Calcified annulus. There was mild regurgitation. - Left atrium: AP diameter small as it appears that patient may have hiatal hernia compressing posterior aspect of LA Consider CT to further evaluate or carbonated beverage. The atrium was moderately dilated. - Right atrium: The atrium was moderately dilated. - Atrial septum: No defect or patent foramen ovale was identified. - Tricuspid valve: There was mild-moderate regurgitation. - Pulmonary arteries: PA peak pressure: 34 mm Hg (S). - Pericardium, extracardiac: A  trivial pericardial effusion was identified posterior to the heart.  ekg Sinus tachycardia Multiple premature complexes, vent & supraven Left axis deviation Anteroseptal infarct, age indeterminate  Atrial fibrillation with rapid ventricular response ST & T wave abnormality, consider inferior ischemia ST & T wave abnormality, consider anterolateral ischemia  Microbiology: Recent Results (from the past 240 hour(s))  URINE CULTURE     Status: None   Collection Time    01/06/14 12:33 PM      Result Value Ref Range Status   Specimen Description URINE, CLEAN CATCH   Final   Special Requests NONE   Final   Culture  Setup Time     Final   Value: 01/06/2014 18:50     Performed at [facility]     Final   Value: 75,000 COLONIES/ML     Performed at [facility]   Culture     Final   Value: ESCHERICHIA COLI     Performed at [facility]   Report Status 01/08/2014 FINAL   Final   Organism ID, Bacteria ESCHERICHIA COLI   Final  CULTURE, BLOOD (ROUTINE X 2)     Status: None   Collection Time    01/06/14  8:27 PM      Result Value Ref Range  Status   Specimen Description BLOOD LEFT HAND   Final   Special Requests BOTTLES DRAWN AEROBIC ONLY 5CC   Final   Culture  Setup Time     Final   Value: 01/07/2014 02:29     Performed at [facility]   Culture     Final   Value:        BLOOD CULTURE RECEIVED NO GROWTH TO DATE CULTURE WILL BE HELD FOR 5 DAYS BEFORE ISSUING A FINAL NEGATIVE REPORT     Performed at [facility]   Report Status PENDING   Incomplete  CULTURE, BLOOD (ROUTINE X 2)     Status: None   Collection Time    01/06/14  8:31 PM      Result Value Ref Range Status   Specimen Description BLOOD RIGHT HAND   Final   Special Requests BOTTLES DRAWN AEROBIC ONLY 2CC   Final   Culture  Setup Time     Final   Value: 01/07/2014 02:29     Performed at [facility]   Culture     Final   Value:        BLOOD CULTURE RECEIVED  NO GROWTH TO DATE CULTURE WILL BE HELD FOR 5 DAYS BEFORE ISSUING A FINAL NEGATIVE REPORT     Performed at [facility]   Report Status PENDING   Incomplete  CULTURE, EXPECTORATED SPUTUM-ASSESSMENT     Status: None   Collection Time    01/07/14 10:36 AM      Result Value Ref Range Status   Specimen Description SPUTUM   Final   Special Requests NONE   Final   Sputum evaluation     Final   Value: THIS SPECIMEN IS ACCEPTABLE. RESPIRATORY CULTURE REPORT TO FOLLOW.   Report Status 01/07/2014 FINAL   Final  CULTURE, RESPIRATORY (NON-EXPECTORATED)     Status: None   Collection Time    01/07/14 10:36 AM      Result Value Ref Range Status   Specimen Description SPUTUM   Final   Special Requests NONE   Final   Gram Stain     Final   Value: FEW WBC PRESENT, PREDOMINANTLY PMN     NO SQUAMOUS EPITHELIAL CELLS SEEN     RARE GRAM POSITIVE COCCI     IN PAIRS     Performed at [facility]   Culture     Final   Value: FEW ESCHERICHIA COLI     Performed at [facility]   Report Status 01/09/2014 FINAL   Final   Organism ID, Bacteria ESCHERICHIA COLI   Final     Labs: Basic Metabolic Panel:  Recent Labs Lab 01/06/14 1402 01/07/14 1144 01/08/14 0912 01/09/14 0915  NA 138 136* 141 143  K 3.9 3.5* 3.5* 3.7  CL 98 100 106 107  CO2 25 25 23 26   GLUCOSE 143* 140* 164* 102*  BUN 13 12 6  5*  CREATININE 0.53 0.65 0.63 0.61  CALCIUM 9.1 7.9* 7.8* 8.6  MG  --  1.5 2.0  --    Liver Function Tests:  Recent Labs Lab 01/06/14 1402  AST 41*  ALT 28  ALKPHOS 105  BILITOT 0.9  PROT 5.9*  ALBUMIN 2.6*   No results found for this basename: LIPASE, AMYLASE,  in the last 168 hours No results found for this basename: AMMONIA,  in the last 168 hours CBC:  Recent Labs Lab 01/06/14 1402 01/07/14 1144  WBC 22.4*  15.6*  NEUTROABS 20.0* 12.7*  HGB 13.4 11.4*  HCT 41.5 35.7*  MCV 78.0 77.9*  PLT 463* 426*   Cardiac Enzymes:  Recent Labs Lab 01/06/14 1402  01/07/14 1144  CKTOTAL 470* 206*  TROPONINI <0.30  --    BNP: BNP (last 3 results)  Recent Labs  02/25/13 1108  PROBNP 185.0*   CBG:  Recent Labs Lab 01/08/14 1132 01/08/14 1655 01/08/14 2043 01/09/14 0649 01/09/14 1118  GLUCAP 119* 93 100* 88 109*   Urinalysis    Component Value Date/Time   COLORURINE YELLOW 01/06/2014 [place] 01/06/2014 1233   LABSPEC 1.021 01/06/2014 1233   PHURINE 7.0 01/06/2014 [place] 01/06/2014 1233   HGBUR NEGATIVE 01/06/2014 1233   BILIRUBINUR NEGATIVE 01/06/2014 1233   KETONESUR >80* 01/06/2014 1233   PROTEINUR 30* 01/06/2014 1233   UROBILINOGEN 1.0 01/06/2014 1233   NITRITE NEGATIVE 01/06/2014 1233   LEUKOCYTESUR SMALL* 01/06/2014 1233      Signed:  Eddystone L  [facility] 01/09/2014, 11:26 AM

## 2014-01-09 NOTE — Progress Notes (Signed)
Pt discharge to [facility]. No s/s of distress no voiced complaints. Family at bedside.

## 2014-01-09 NOTE — PMR Pre-admission (Signed)
PMR Admission Coordinator Pre-Admission Assessment  Patient: [name] is an 80 y.o., female MRN: [id number] DOB: 1928/11/07 Height: 5' (152.4 cm) Weight: 52.5 kg (115 lb 11.9 oz)              Insurance Information HMO: No      PPO:       PCP:       IPA:       80/20:       OTHER:   PRIMARY:  Medicare A/B      Policy#: [id number]      Subscriber: [name] CM Name:        Phone#:       Fax#:   Pre-Cert#:        Employer: Retired Benefits:  Phone #:       Name: Checked in [place]. Date: 04/01/96     Deduct: $1260      Out of Pocket Max: none      Life Max: unlimited CIR: 100%      SNF: 100 days Outpatient: 80%     Co-Pay: 20% Home Health: 100%      Co-Pay: none DME: 80%     Co-Pay: 20% Providers: patient's choice  SECONDARYAurea Graff      Policy#: [id number]      Subscriber: [place] Name: Lorel Monaco      Phone#:       Fax#:   Pre-Cert#:        Employer: Retired Benefits:  Phone #:       Name:   Irene Shipper. Date:       Deduct:        Out of Pocket Max:        Life Max:   CIR:        SNF:   Outpatient:       Co-Pay:   Home Health:        Co-Pay:   DME:       Co-Pay:    Emergency Contact Information Contact Information   Name Relation Home Work Mobile   Ritter,Judy Niece [phone]  [phone]   Theodosia Paling Niece [phone]  [phone]     Current Medical History  Patient Admitting Diagnosis: Deconditioning after pneumonia, and multiple medical issues. Recent fall with trunk,soft tissue trauma    History of Present Illness: An 80 y.o. female with history of A fib, HTN, esophageal cancer s/p esophagectomy with gastric pull thorough, severe GERD, gastroparesis, recurrent aspiration PNA for past 6 months; who was found by her neighbor in her garden tub on 01/06/14. She reported having tripped and fallen in the tub about 12 hours prior to being found. Family reported recent cold type symptoms for the past few weeks with decrease in po intake as well as recent  d/c of coumadin due to hematemesis. CT head without acute changes. CT cervical spine with mild spinal stenosis with cord flattening and dilated esophagus. She was started on IV antibiotics for LLL aspiration PNA and therapies ordered. Digoxin discontinued as family question A fib history. She develop A Fib with RVR 150-170 requiring IV metoprolol. Patient with gait disorder and had been referred to outpatient PT but had to stop that due to medical issues. OT evaluation done and CIR recommended due to balance deficits.      Past Medical History  Past Medical History  Diagnosis Date  . GERD (gastroesophageal reflux disease)   . Osteoporosis   . Postmenopausal HRT (hormone replacement therapy)   . Anemia   .  Hypertension   . Allergy   . Asthma   . Shortness of breath   . PONV (postoperative nausea and vomiting)   . Dysrhythmia     a fib  . Esophageal cancer     removed esophagus stomach pulled up  . Basal cell carcinoma of face     "several; freezes them off"  . Pneumonia     "all the time; probably 5 times in the last year, counting today" (01/06/2014)  . Chronic bronchitis     "practically q winter" (01/06/2014)  . History of blood transfusion 1993    "related to esophagus removal" (01/06/2014)  . Headache(784.0)     "monthly in the past year" (01/06/2014)  . Arthritis     "legs" (01/06/2014)    Family History  family history includes Asthma in her brother and sister; Cervical cancer in her mother; Colon polyps in her sister; Coronary artery disease in her brother and sister; Heart disease in her father; Hypotension in her sister; Pancreatic cancer in an other family member. There is no history of Colon cancer.  Prior Rehab/Hospitalizations:  Went to [facility] for outpatient therapy X 2 sessions.  Has in [facility] in [place] X 2 for 3-4 weeks each stay.   Current Medications  Current facility-administered medications:acetaminophen (TYLENOL) tablet 650 mg, 650 mg, Oral, TID,  Delfina Redwood, MD, 650 mg at 01/08/14 2201;  amiodarone (PACERONE) tablet 200 mg, 200 mg, Oral, BID, Delfina Redwood, MD, 200 mg at 01/09/14 1015;  Ampicillin-Sulbactam (UNASYN) 3 g in sodium chloride 0.9 % 100 mL IVPB, 3 g, Intravenous, Q8H, Delfina Redwood, MD, 3 g at 01/09/14 0342 arformoterol (BROVANA) nebulizer solution 15 mcg, 15 mcg, Nebulization, BID, Nita Sells, MD, 15 mcg at 01/09/14 0900;  budesonide (PULMICORT) nebulizer solution 0.25 mg, 0.25 mg, Nebulization, BID, Nita Sells, MD, 0.25 mg at 01/09/14 0900;  chlorpheniramine-HYDROcodone (TUSSIONEX) 10-8 MG/5ML suspension 5 mL, 5 mL, Oral, Q12H PRN, Nita Sells, MD, 5 mL at 01/09/14 1116 dextromethorphan-guaiFENesin ([place] DM) 30-600 MG per 12 hr tablet 1 tablet, 1 tablet, Oral, BID, Nita Sells, MD, 1 tablet at 01/09/14 1015;  digoxin (LANOXIN) tablet 0.125 mg, 0.125 mg, Oral, Daily, Delfina Redwood, MD, 0.125 mg at 01/09/14 1015;  feeding supplement (ENSURE COMPLETE) (ENSURE COMPLETE) liquid 237 mL, 237 mL, Oral, Q lunch, Rogue Bussing, RD furosemide (LASIX) tablet 40 mg, 40 mg, Oral, Daily, Nita Sells, MD, 40 mg at 01/09/14 1015;  levalbuterol (XOPENEX) nebulizer solution 0.63 mg, 0.63 mg, Nebulization, Q2H PRN, Delfina Redwood, MD;  Levofloxacin (LEVAQUIN) IVPB 250 mg, 250 mg, Intravenous, Q24H, Anh P Pham, RPH, 250 mg at 01/08/14 1542;  levofloxacin (LEVAQUIN) IVPB 750 mg, 750 mg, Intravenous, Once, Leota Jacobsen, MD metoCLOPramide (REGLAN) tablet 5 mg, 5 mg, Oral, BID, Nita Sells, MD, 5 mg at 01/09/14 1015;  metoprolol (LOPRESSOR) injection 2.5 mg, 2.5 mg, Intravenous, Q6H PRN, Delfina Redwood, MD;  olopatadine (PATANOL) 0.1 % ophthalmic solution 1 drop, 1 drop, Both Eyes, BID, Nita Sells, MD, 1 drop at 01/09/14 1015;  pantoprazole (PROTONIX) EC tablet 40 mg, 40 mg, Oral, BID, Nita Sells, MD, 40 mg at 01/09/14 3845 potassium chloride 10 mEq in  100 mL IVPB, 10 mEq, Intravenous, Q1 Hr x 2, Delfina Redwood, MD;  promethazine (PHENERGAN) suppository 25 mg, 25 mg, Rectal, Q6H PRN, Nita Sells, MD;  sodium chloride 0.9 % injection 10-40 mL, 10-40 mL, Intracatheter, PRN, Delfina Redwood, MD, 10 mL at 01/09/14 0914;  sucralfate (CARAFATE) 1 GM/10ML suspension 1 g, 1 g, Oral, BID, Nita Sells, MD, 1 g at 01/09/14 2376 traMADol (ULTRAM) tablet 50 mg, 50 mg, Oral, Q8H PRN, Nita Sells, MD, 50 mg at 01/08/14 2831;  Vitamin D (Ergocalciferol) (DRISDOL) capsule 50,000 Units, 50,000 Units, Oral, Q7 days, Nita Sells, MD, 50,000 Units at 01/07/14 1007;  zolpidem (AMBIEN) tablet 5 mg, 5 mg, Oral, QHS, Nita Sells, MD, 5 mg at 01/08/14 2201  Patients Current Diet: General  Precautions / Restrictions Precautions Precautions: Fall Restrictions Weight Bearing Restrictions: No   Prior Activity Level Community (5-7x/wk): Went out daily.  Drove short distances to post office, Tax adviser, etc.  Development worker, international aid / [place] Devices/Equipment: Eyeglasses;Nebulizer;Walker (specify type);Cane (specify quad or straight);Other (Comment);Grab bars in shower;Hand-held shower hose;Built-in shower seat (raised toilet; recliner) Home Equipment: Walker - 2 wheels;Shower seat - built in;Grab bars - toilet;Grab bars - tub/shower;Hand held shower head  Prior Functional Level Prior Function Level of Independence: Independent with assistive device(s) Comments: sleeps in recliner at home. Pt drives. Pt also reports that she had assistance at home 2x/week for 4 hours until recently "She didn't do, much, I can do everything myself, I didn't need her any more." Had not been using walker.  Current Functional Level Cognition  Overall Cognitive Status: Within Functional Limits for tasks assessed Orientation Level: Oriented X4    Extremity Assessment (includes Sensation/Coordination)  Upper Extremity  Assessment: Defer to OT evaluation  Lower Extremity Assessment: Generalized weakness    ADLs  Overall ADL's : Needs assistance/impaired Grooming: Wash/dry hands;Wash/dry face;Set up;Sitting Upper Body Bathing: Set up;Sitting Lower Body Bathing: Moderate assistance;Sit to/from stand Lower Body Dressing: Set up;Moderate assistance;Sit to/from stand Toilet Transfer: Moderate assistance;BSC;Stand-pivot;Ambulation;Cueing for safety;Cueing for sequencing Toilet Transfer Details (indicate cue type and reason): Pt transferred to 3:1 x2 during this therapy session, pt demonstrates posterior & left lateral lean with sit to stand noted and requires physical assist to correct. Toileting- Clothing Manipulation and Hygiene: Minimal assistance;Sitting/lateral lean Functional mobility during ADLs: Minimal assistance;Moderate assistance;Cueing for safety;Cueing for sequencing General ADL Comments: Pt was educated in role of OT and participated in ADL retraining session after assessment today. Pt appears to fatigue from coughing during minimal activity. Pt currently requires physical assist for all ADL's and transfers, rec CIR consult/rehab secondary to lives alone & was independent PTA. Pt was sitting up in chair, w/ call bell/phone in reach, eating breakfast and RN in room at conclusion of session (Pt was educated in role of OT and participated in ADL retraining session after assessment today. Pt appears to fatigue from coughing during minimal activity. Pt currently requires physical assist for all ADL's and transfers, rec CIR consult/rehab seconda)    Mobility  Overal bed mobility: Needs Assistance Bed Mobility: Supine to Sit;Sit to Supine Supine to sit: Min assist Sit to supine: Min assist General bed mobility comments: Assist to bring feet up onto bed.    Transfers  Overall transfer level: Needs assistance Equipment used: Rolling walker (2 wheeled) Transfers: Sit to/from Stand Sit to Stand: Min  assist Stand pivot transfers: Min assist General transfer comment: Verbal cues for hand placement and assist for balance.    Ambulation / Gait / Stairs / Wheelchair Mobility  Ambulation/Gait Ambulation/Gait assistance: Museum/gallery curator (Feet): 150 Feet Assistive device: Rolling walker (2 wheeled) Gait Pattern/deviations: Step-through pattern;Narrow base of support General Gait Details: Pt with occasional posterior stagger.    Posture / Balance Overall balance assessment: Needs assistance  Sitting-balance support:  No upper extremity supported;Feet supported  Sitting balance-Leahy Scale: Fair    Special needs/care consideration BiPAP/CPAP No CPM No Continuous Drip IV KVO for IV antibiotics Dialysis No        Life Vest No Oxygen No Special Bed No Trach Size No Wound Vac (area) No     Skin Has scrapes on L wrist and elbow with dressings.  Back is bruised and sore.  Weakness right knee since the fall at home                              Bowel mgmt: Loose stools today, 01/09/13 Bladder mgmt: Urgency incontinence at times.  Using BSC. Diabetic mgmt No    Previous Home Environment Living Arrangements: Alone Available Help at Discharge: Available PRN/intermittently;Other (Comment);Neighbor;Family Type of Home: House Home Layout: One level Home Access: Stairs to enter Entrance Stairs-Rails: Right Entrance Stairs-Number of Steps: 5 Bathroom Shower/Tub: Multimedia programmer: Handicapped height Bathroom Accessibility: Yes How Accessible: Accessible via walker [place]: No  Discharge Living Setting Plans for Discharge Living Setting: Patient's home;Alone;House Type of Home at Discharge: House Discharge Home Layout: One level Discharge Home Access: Stairs to enter Entrance Stairs-Rails: Right Entrance Stairs-Number of Steps: 5 steps at garage entrance Does the patient have any problems obtaining your medications?: No  Social/Family/Support  Systems Patient Roles: Other (Comment) (Has a niece and a great niece.) Contact Information: [name] - niece and Theodosia Paling - great niece Anticipated Caregiver: self Anticipated Caregiver's Contact Information: See emergency contacts Ability/Limitations of Caregiver: Niece checks on patient daily, calls daily Caregiver Availability: Other (Comment) (Family calls to check on patient.) Discharge Plan Discussed with Primary Caregiver: Yes Is Caregiver In Agreement with Plan?: Yes Does Caregiver/Family have Issues with Lodging/Transportation while Pt is in Rehab?: No  Goals/Additional Needs Patient/Family Goal for Rehab: PT/OT mod I/Supervision goals.  Patient hopes to discharge home at mod I level. Expected length of stay: 8-12 days Cultural Considerations: [place].  Attends the [employer] in [place] Dietary Needs: Regular diet, thin liquids Equipment Needs: TBD Pt/Family Agrees to Admission and willing to participate: Yes Program Orientation Provided & Reviewed with Pt/Caregiver Including Roles  & Responsibilities: Yes  Decrease burden of Care through IP rehab admission: N/A  Possible need for SNF placement upon discharge: Not anticipated.  However, if patient is not safe to go home at mod I level, may need to consider SNF.  Patient Condition: This patient's condition remains as documented in the consult dated 01/07/14, in which the Rehabilitation Physician determined and documented that the patient's condition is appropriate for intensive rehabilitative care in an inpatient rehabilitation facility. Will admit to inpatient rehab today.  Preadmission Screen Completed By:  Retta Diones, 01/09/2014 12:00 PM ______________________________________________________________________   Discussed status with Dr. Letta Pate on 01/09/14 at 1215 and received telephone approval for admission today.  Admission Coordinator:  Retta Diones, time1215/Date06/11/15

## 2014-01-09 NOTE — H&P (Signed)
Physical Medicine and Rehabilitation Admission H&P      Chief Complaint   Patient presents with   .  Deconditioning due to multiple medical issues.       HPI: [name] is a 80 y.o. female with history of A fib, HTN, esophageal cancer s/p esophagectomy with gastric pull thorough, severe GERD, gastroparesis, recurrent aspiration PNA for past 6 months; who was found by her neighbor in her garden tub on 01/06/14. She reported having tripped and fallen in the tub about 12 hours prior to being found. Family reported recent cold type symptoms for the past few weeks with decrease in po intake as well as recent d/c of coumadin due to hematemesis. CT head without acute changes. CT cervical spine with mild spinal stenosis with cord flattening and dilated esophagus. She was started on IV antibiotics for LLL aspiration PNA and therapies ordered. Digoxin discontinued as family question A fib history. She develop A Fib with RVR 150-170 requiring IV metoprolol and amiodarone. 2D echo with EF 60-65% and    Patient with gait disorder and had been referred to outpatient PT but had to stop that due to medical issues. She continues to have episodic hemoptysis and is a fall risk therefore coumadin and ASA discontinued with recommendations to follow up with cardiology on outpatient basis for input on plavix.  Urine Culture with 75,000 E coli and sputum culture with few E coli.  Blood cultures pending. PICC placed for IV antibiotic. Therapies initiated and limtied by tachycardia. CIR recommended by MD and rehab team and patient admitted today.     Patient is full code. Wants everything done possible but does not want to be "kept alive if there's no hope" Chronic tremor in the right upper extremity, was evaluated by neurology. Does not have Parkinson's disease. Tremor most likely secondary to Reglan Also has had neck tilting towards the left side since her surgery for the esophageal cancer  Review of Systems   HENT: Negative for hearing loss.   Eyes: Negative for blurred vision and double vision.  Respiratory: Positive for cough, hemoptysis (ongoing), sputum production and shortness of breath. Negative for wheezing.   Cardiovascular: Positive for leg swelling. Negative for chest pain and palpitations.  Gastrointestinal: Positive for heartburn and diarrhea (today). Negative for abdominal pain.  Genitourinary: Positive for urgency and frequency.  Musculoskeletal: Positive for back pain, myalgias and neck pain (chronic neck pain> back pain).  Neurological: Positive for weakness. Negative for dizziness, speech change and headaches.  Psychiatric/Behavioral: Positive for memory loss. Negative for depression. The patient does not have insomnia.   Past Medical History   Diagnosis  Date   .  GERD (gastroesophageal reflux disease)     .  Osteoporosis     .  Postmenopausal HRT (hormone replacement therapy)     .  Anemia     .  Hypertension     .  Allergy     .  Asthma     .  Shortness of breath     .  PONV (postoperative nausea and vomiting)     .  Dysrhythmia         a fib   .  Esophageal cancer         removed esophagus stomach pulled up   .  Basal cell carcinoma of face         "several; freezes them off"   .  Pneumonia         "all the  time; probably 5 times in the last year, counting today" (01/06/2014)   .  Chronic bronchitis         "practically q winter" (01/06/2014)   .  History of blood transfusion  1993       "related to esophagus removal" (01/06/2014)   .  Headache(784.0)         "monthly in the past year" (01/06/2014)   .  Arthritis         "legs" (01/06/2014)    Past Surgical History   Procedure  Laterality  Date   .  Left oophorectomy    1952   .  Esophageal removal of cancer,gastric ppull-thru 1993       .  Forearm fracture surgery  Right  ?1990's   .  Esophagogastroduodenoscopy    10/19/2011       Procedure: ESOPHAGOGASTRODUODENOSCOPY (EGD);  Surgeon: Milus Banister, MD;  Location:  [facility];  Service: Endoscopy;  Laterality: N/A;   .  Balloon dilation    10/19/2011       Procedure: BALLOON DILATION;  Surgeon: Milus Banister, MD;  Location: [facility]; Service: Endoscopy;  Laterality: N/A;   .  Esophagogastroduodenoscopy    11/07/2011       Procedure: ESOPHAGOGASTRODUODENOSCOPY (EGD);  Surgeon: Ladene Artist, MD,FACG;  Location: [facility];  Service: Endoscopy;  Laterality: N/A;   .  Esophagogastroduodenoscopy    11/22/2011       Procedure: ESOPHAGOGASTRODUODENOSCOPY (EGD);  Surgeon: Inda Castle, MD;  Location: [facility];  Service: Endoscopy;  Laterality: N/A;   .  Esophagogastroduodenoscopy    11/24/2011       Procedure: ESOPHAGOGASTRODUODENOSCOPY (EGD);  Surgeon: Inda Castle, MD;  Location: [facility];  Service: Endoscopy;  Laterality: N/A;  with removable duodenal stent (actually using esophageal partially covered 23X15 located in locked supply room   .  Duodenal stent placement    11/24/2011       Procedure: DUODENAL STENT PLACEMENT;  Surgeon: Inda Castle, MD;  Location: [facility];  Service: Endoscopy;  Laterality: N/A;   .  Video bronchoscopy  Bilateral  01/18/2013       Procedure: VIDEO BRONCHOSCOPY WITHOUT FLUORO;  Surgeon: Tanda Rockers, MD;  Location: [facility];  Service: Cardiopulmonary;  Laterality: Bilateral;   .  Esophagogastroduodenoscopy  N/A  02/11/2013       Procedure: ESOPHAGOGASTRODUODENOSCOPY (EGD);  Surgeon: Inda Castle, MD;  Location: [facility];  Service: Endoscopy;  Laterality: N/A;   .  Duodenal stent placement  N/A  02/11/2013       Procedure: DUODENAL STENT PLACEMENT;  Surgeon: Inda Castle, MD;  Location: [facility];  Service: Endoscopy;  Laterality: N/A;   .  Esophagogastroduodenoscopy  N/A  09/11/2013       Procedure: ESOPHAGOGASTRODUODENOSCOPY (EGD);  Surgeon: Gatha Mayer, MD;  Location: [facility];  Service: Endoscopy;  Laterality: N/A;   .  Cholecystectomy    ~ 1996   .  Dilation and curettage of  uterus    X 6-7   .  Cataract extraction w/ intraocular lens  implant, bilateral  Bilateral  ~ 2000    Family History   Problem  Relation  Age of Onset   .  Cervical cancer  Mother     .  Heart disease  Father         MI   .  Coronary artery disease  Brother     .  Hypotension  Sister     .  Colon cancer  Neg Hx     .  Colon polyps  Sister     .  Pancreatic cancer           1/2 sister   .  Coronary artery disease  Sister         pacemaker   .  Asthma  Brother     .  Asthma  Sister      Social History: Divorced. 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 Has two nieces who call her daily. She reports that she has never smoked. She has never used smokeless tobacco. She reports that she does not drink alcohol or use illicit drugs      Allergies   Allergen  Reactions   .  Erythromycin Ethylsuccinate         Irregular pulse rate   .  Prednisone         "makes me hyper"   .  Doxycycline  Rash    Medications Prior to Admission   Medication  Sig  Dispense  Refill   .  acetaminophen (TYLENOL) 500 MG tablet  Take 1 tablet (500 mg total) by mouth every 6 (six) hours as needed for headache.   30 tablet      .  albuterol (PROVENTIL HFA;VENTOLIN HFA) 108 (90 BASE) MCG/ACT inhaler  Inhale 2 puffs into the lungs every 6 (six) hours as needed. For wheezing   1 Inhaler   4   .  albuterol (PROVENTIL) (2.5 MG/3ML) 0.083% nebulizer solution  Take 3 mLs (2.5 mg total) by nebulization every 4 (four) hours as needed for wheezing or shortness of breath.   75 mL   12   .  arformoterol (BROVANA) 15 MCG/2ML NEBU  Take 2 mLs (15 mcg total) by nebulization 2 (two) times daily. Dx: 493.00   120 mL      .  budesonide (PULMICORT) 0.25 MG/2ML nebulizer solution  Take 2 mLs (0.25 mg total) by nebulization 2 (two) times daily. Dx: 493.00   60 mL      .  chlorpheniramine-HYDROcodone (TUSSIONEX PENNKINETIC ER) 10-8 MG/5ML LQCR  Take 5 mLs by mouth every 12 (twelve) hours as needed for  cough.   480 mL   0   .  digoxin (LANOXIN) 0.125 MG tablet  Take 1 tablet (0.125 mg total) by mouth daily.   90 tablet   3   .  Fe Fum-FA-B Cmp-C-Zn-Mg-Mn-Cu (HEMOCYTE PLUS) 106-1 MG CAPS  Take 1 tablet by mouth 2 (two) times daily.         .  furosemide (LASIX) 40 MG tablet  Take 1 tablet (40 mg total) by mouth daily.   30 tablet   0   .  Methylcobalamin (B-12) 5000 MCG TBDP  Take 1 tablet by mouth daily.         .  Multiple Vitamins-Calcium (VIACTIV MULTI-VITAMIN) CHEW  Chew 1 each by mouth daily.      0   .  Olopatadine HCl (PATADAY) 0.2 % SOLN  Apply 1-2 drops to eye 2 (two) times daily as needed (dry, itchy eyes.).         Marland Kitchen  promethazine (PHENERGAN) 25 MG suppository  Place 25 mg rectally every 6 (six) hours as needed for nausea or vomiting.         .  traMADol (ULTRAM) 50 MG tablet  Take 1 tablet (50 mg total) by mouth every 8 (eight) hours as needed for  moderate pain.   30 tablet   0   .  Vitamin D, Ergocalciferol, (DRISDOL) 50000 UNITS CAPS capsule  Take 1 capsule (50,000 Units total) by mouth every 7 (seven) days. On Friday   30 capsule   3   .  zolpidem (AMBIEN) 5 MG tablet  Take 5 mg by mouth at bedtime.         .  [DISCONTINUED] dextromethorphan-guaiFENesin (MUCINEX DM) 30-600 MG per 12 hr tablet  Take 0.5 tablets by mouth 2 (two) times daily.         .  [DISCONTINUED] metoCLOPramide (REGLAN) 5 MG tablet  Take 1 tablet (5 mg total) by mouth 2 (two) times daily.   180 tablet   3   .  [DISCONTINUED] sucralfate (CARAFATE) 1 GM/10ML suspension  Take 1 g by mouth 2 (two) times daily.            Home: Home Living Family/patient expects to be discharged to:: Private residence Living Arrangements: Alone Available Help at Discharge: Available PRN/intermittently;Other (Comment);Neighbor;Family Type of Home: House Home Access: Stairs to enter [employer] of Steps: 5 Entrance Stairs-Rails: Right Home Layout: One level Home Equipment: Walker - 2 wheels;Shower seat - built  in;Grab bars - toilet;Grab bars - tub/shower;Hand held shower head    Functional History: Prior Function Level of Independence: Independent with assistive device(s) Comments: sleeps in recliner at home. Pt drives. Pt also reports that she had assistance at home 2x/week for 4 hours until recently "She didn't do, much, I can do everything myself, I didn't need her any more." Had not been using walker.   Functional Status:   Mobility: Bed Mobility Overal bed mobility: Needs Assistance Bed Mobility: Supine to Sit;Sit to Supine Supine to sit: Min assist Sit to supine: Min assist General bed mobility comments: Assist to bring feet up onto bed. Transfers Overall transfer level: Needs assistance Equipment used: Rolling walker (2 wheeled) Transfers: Sit to/from Stand Sit to Stand: Min assist Stand pivot transfers: Min assist General transfer comment: Verbal cues for hand placement and assist for balance. Ambulation/Gait Ambulation/Gait assistance: Min assist Ambulation Distance (Feet): 150 Feet Assistive device: Rolling walker (2 wheeled) Gait Pattern/deviations: Step-through pattern;Narrow base of support General Gait Details: Pt with occasional posterior stagger.   ADL: ADL Overall ADL's : Needs assistance/impaired Grooming: Wash/dry hands;Wash/dry face;Set up;Sitting Upper Body Bathing: Set up;Sitting Lower Body Bathing: Moderate assistance;Sit to/from stand Lower Body Dressing: Set up;Moderate assistance;Sit to/from stand Toilet Transfer: Moderate assistance;BSC;Stand-pivot;Ambulation;Cueing for safety;Cueing for sequencing Toilet Transfer Details (indicate cue type and reason): Pt transferred to 3:1 x2 during this therapy session, pt demonstrates posterior & left lateral lean with sit to stand noted and requires physical assist to correct. Toileting- Clothing Manipulation and Hygiene: Minimal assistance;Sitting/lateral lean Functional mobility during ADLs: Minimal  assistance;Moderate assistance;Cueing for safety;Cueing for sequencing General ADL Comments: Pt was educated in role of OT and participated in ADL retraining session after assessment today. Pt appears to fatigue from coughing during minimal activity. Pt currently requires physical assist for all ADL's and transfers, rec CIR consult/rehab secondary to lives alone & was independent PTA. Pt was sitting up in chair, w/ call bell/phone in reach, eating breakfast and RN in room at conclusion of session (Pt was educated in role of OT and participated in ADL retraining session after assessment today. Pt appears to fatigue from coughing during minimal activity. Pt currently requires physical assist for all ADL's and transfers, rec CIR consult/rehab seconda)   Cognition: Cognition Overall Cognitive Status:  Within Functional Limits for tasks assessed Orientation Level: Oriented X4 Cognition Arousal/Alertness: Awake/alert Behavior During Therapy: WFL for tasks assessed/performed Overall Cognitive Status: Within Functional Limits for tasks assessed   Physical Exam: Blood pressure 101/69, pulse 87, temperature 97.9 F (36.6 C), temperature source Oral, resp. rate 16, height 5' (1.524 m), weight 52.5 kg (115 lb 11.9 oz), SpO2 98.00%. Physical Exam  Nursing note and vitals reviewed. Constitutional: She is oriented to person, place, and time.  Frail, elderly female and keeps neck tilted to left chronically  HENT:   Head: Normocephalic and atraumatic.  Eyes: Conjunctivae are normal. Pupils are equal, round, and reactive to light.  Cardiovascular: An irregularly irregular rhythm present. Tachycardia present.   Respiratory: Effort normal. No respiratory distress. She has decreased breath sounds in the left lower field. She has rhonchi in the right upper field, the right middle field and the left middle field.  Has paroxsymal episodic productive coughing spells during conversation.   GI: Soft. Bowel sounds  are normal. She exhibits no distension. There is no tenderness.  Musculoskeletal: She exhibits edema (2+ pitting edema BLE tibially).  Resting tremor RUE (tardive dyskinesia)   Neurological: She is alert and oriented to person, place, and time.  Pleasant and appropriate. Follows commands without difficulty.   Skin: Skin is warm and dry.  Large bruise upper mid thoracic spine.   Psychiatric: She has a normal mood and affect. Her behavior is normal. Thought content normal.    Laterocollis to Left  Motor strength is 4/5 bilateral deltoid, bicep, tricep, grip, hip flexor, knee extensor, ankle dorsiflexor and plantar flexor Results for orders placed during the hospital encounter of 01/06/14 (from the past 48 hour(s))   GLUCOSE, CAPILLARY     Status: None     Collection Time      01/07/14  4:58 PM       Result  Value  Ref Range     Glucose-Capillary  99   70 - 99 mg/dL     Comment 1  Documented in Chart        Comment 2  Notify RN      GLUCOSE, CAPILLARY     Status: Abnormal     Collection Time      01/07/14  9:14 PM       Result  Value  Ref Range     Glucose-Capillary  124 (*)  70 - 99 mg/dL     Comment 1  Documented in Chart        Comment 2  Notify RN      GLUCOSE, CAPILLARY     Status: None     Collection Time      01/08/14  6:13 AM       Result  Value  Ref Range     Glucose-Capillary  89   70 - 99 mg/dL   BASIC METABOLIC PANEL     Status: Abnormal     Collection Time      01/08/14  9:12 AM       Result  Value  Ref Range     Sodium  141   137 - 147 mEq/L     Potassium  3.5 (*)  3.7 - 5.3 mEq/L     Chloride  106   96 - 112 mEq/L     CO2  23   19 - 32 mEq/L     Glucose, Bld  164 (*)  70 - 99 mg/dL     BUN  6   6 - 23 mg/dL     Creatinine, Ser  0.63   0.50 - 1.10 mg/dL     Calcium  7.8 (*)  8.4 - 10.5 mg/dL     GFR calc non Af Amer  81 (*)  >90 mL/min     GFR calc Af Amer  >90   >90 mL/min     Comment:  (NOTE)        The eGFR has been calculated using the CKD EPI equation.         This calculation has not been validated in all clinical situations.        eGFR's persistently <90 mL/min signify possible Chronic Kidney        Disease.   MAGNESIUM     Status: None     Collection Time      01/08/14  9:12 AM       Result  Value  Ref Range     Magnesium  2.0   1.5 - 2.5 mg/dL   GLUCOSE, CAPILLARY     Status: Abnormal     Collection Time      01/08/14 11:32 AM       Result  Value  Ref Range     Glucose-Capillary  119 (*)  70 - 99 mg/dL     Comment 1  Notify RN      GLUCOSE, CAPILLARY     Status: None     Collection Time      01/08/14  4:55 PM       Result  Value  Ref Range     Glucose-Capillary  93   70 - 99 mg/dL   GLUCOSE, CAPILLARY     Status: Abnormal     Collection Time      01/08/14  8:43 PM       Result  Value  Ref Range     Glucose-Capillary  100 (*)  70 - 99 mg/dL     Comment 1  Documented in Chart        Comment 2  Notify RN      GLUCOSE, CAPILLARY     Status: None     Collection Time      01/09/14  6:49 AM       Result  Value  Ref Range     Glucose-Capillary  88   70 - 99 mg/dL   BASIC METABOLIC PANEL     Status: Abnormal     Collection Time      01/09/14  9:15 AM       Result  Value  Ref Range     Sodium  143   137 - 147 mEq/L     Potassium  3.7   3.7 - 5.3 mEq/L     Chloride  107   96 - 112 mEq/L     CO2  26   19 - 32 mEq/L     Glucose, Bld  102 (*)  70 - 99 mg/dL     BUN  5 (*)  6 - 23 mg/dL     Creatinine, Ser  0.61   0.50 - 1.10 mg/dL     Calcium  8.6   8.4 - 10.5 mg/dL     GFR calc non Af Amer  82 (*)  >90 mL/min     GFR calc Af Amer  >90   >90 mL/min     Comment:  (NOTE)  The eGFR has been calculated using the CKD EPI equation.        This calculation has not been validated in all clinical situations.        eGFR's persistently <90 mL/min signify possible Chronic Kidney        Disease.   GLUCOSE, CAPILLARY     Status: Abnormal     Collection Time      01/09/14 11:18 AM       Result  Value  Ref Range      Glucose-Capillary  109 (*)  70 - 99 mg/dL     Comment 1  Notify RN       Dg Chest Port 1 View   01/08/2014   CLINICAL DATA:  PICC placement.  Recurrent atrial fibrillation.  EXAM: PORTABLE CHEST - 1 VIEW  COMPARISON:  01/06/2014  FINDINGS: PICC has been inserted and the tip is at the level of the carina in the superior vena cava in good position. There is persistent prominence of the cardiomediastinal silhouette. The patient has a known gastric pull-through with gastroesophageal stent due to esophageal cancer.  The area of infiltrate/atelectasis at the left lung base has slightly increased and could represent pneumonia or aspiration pneumonitis.  The pulmonary vascularity is normal. Right lung is clear. No acute osseous abnormalities.  IMPRESSION: 1. PICC is in good position with the tip at the level of the carina. 2. Slight increased atelectasis/infiltrate at the left lung base as described above.   Electronically Signed   By: Rozetta Nunnery M.D.   On: 01/08/2014 15:19           Medical Problem List and Plan: 1. Functional deficits secondary to deconditioning after pneumonia, and multiple medical issues. Recent fall with trunk,soft tissue trauma   2.  DVT Prophylaxis/Anticoagulation: Mechanical: Antiembolism stockings, knee (TED hose) Bilateral lower extremities Sequential compression devices, below knee Bilateral lower extremities 3. Pain Management:  Will continue tylenol tid schedule with tramadol prn for moderate to severe pain.   4. Mood: Has a positive outlook. Will have LCSW follow for evaluation and support.   5. Neuropsych: This patient is capable of making decisions on her own behalf. 6. Recurrent Aspiration PNA/E coli UTI: Narrow antibiotics to rocephin as sensitivities available. Will have speech evaluate swallow to decide on appropriate diet. Check follow up CXR in am.   7. Leucocytosis: will recheck in am. 8. A fib:Now on amiodarone as well as digoxin for rate control.   9.  Chronic cough: will schedule Tessalon perles. Use tussionex bid prn severe cough.    10  Esophageal cancer s/p resection with severe GERD/duodenal stent: Continue home regimen of PPI bid, Carafate and Reglan.  11. Diarrhea: Likely antibiotic associated. Will order probiotic. Check c diff.   12. Chronic bronchitis with asthma: Continue nebs bid. Will continue Mucinex DM as well as IS while awake.     Post Admission Physician Evaluation: Functional deficits secondary  to deconditioning after pneumonia, and multiple medical issues. Recent fall with trunk,soft tissue trauma Patient is admitted to receive collaborative, interdisciplinary care between the physiatrist, rehab nursing staff, and therapy team. Patient's level of medical complexity and substantial therapy needs in context of that medical necessity cannot be provided at a lesser intensity of care such as a SNF. Patient has experienced substantial functional loss from his/her baseline which was documented above under the "Functional History" and "Functional Status" headings.  Judging by the patient's diagnosis, physical exam, and functional history, the patient has potential  for functional progress which will result in measurable gains while on inpatient rehab.  These gains will be of substantial and practical use upon discharge  in facilitating mobility and self-care at the household level. Physiatrist will provide 24 hour management of medical needs as well as oversight of the therapy plan/treatment and provide guidance as appropriate regarding the interaction of the two. 24 hour rehab nursing will assist with bladder management, bowel management, safety, skin/wound care, disease management and medication administration  and help integrate therapy concepts, techniques,education, etc. PT will assess and treat for/with: pre gait, gait training, endurance , safety, equipment, neuromuscular re education.   Goals are: Sup. OT will assess and treat  for/with: ADLs, Cognitive perceptual skills, Neuromuscular re education, safety, endurance, equipment.   Goals are: Sup. SLP will assess and treat for/with: NA.  Goals are: NA. Case Management and Social Worker will assess and treat for psychological issues and discharge planning. Team conference will be held weekly to assess progress toward goals and to determine barriers to discharge. Patient will receive at least 3 hours of therapy per day at least 5 days per week. ELOS: 8-12 Days        Prognosis:  good  [name] M.D. [facility] FAAPM&R (Sports Med, Neuromuscular Med) Diplomate [place] Board of Electrodiagnostic Med  01/09/2014

## 2014-01-09 NOTE — Progress Notes (Signed)
Patient is admitting to CIR today. Social worker will sign off at this time as there are no longer social work needs. Please re consult if social work needs arise  Jeanette Caprice, MSW, [place]

## 2014-01-10 ENCOUNTER — Inpatient Hospital Stay (HOSPITAL_COMMUNITY): Payer: Medicare Other | Admitting: Speech Pathology

## 2014-01-10 ENCOUNTER — Inpatient Hospital Stay (HOSPITAL_COMMUNITY): Payer: Medicare Other

## 2014-01-10 ENCOUNTER — Inpatient Hospital Stay (HOSPITAL_COMMUNITY): Payer: Medicare Other | Admitting: Occupational Therapy

## 2014-01-10 LAB — GLUCOSE, CAPILLARY
GLUCOSE-CAPILLARY: 90 mg/dL (ref 70–99)
GLUCOSE-CAPILLARY: 109 mg/dL — AB (ref 70–99)
Glucose-Capillary: 101 mg/dL — ABNORMAL HIGH (ref 70–99)
Glucose-Capillary: 99 mg/dL (ref 70–99)

## 2014-01-10 LAB — CBC WITH DIFFERENTIAL/PLATELET
Basophils Absolute: 0 10*3/uL (ref 0.0–0.1)
Basophils Relative: 0 % (ref 0–1)
EOS ABS: 0.2 10*3/uL (ref 0.0–0.7)
Eosinophils Relative: 2 % (ref 0–5)
HCT: 33.5 % — ABNORMAL LOW (ref 36.0–46.0)
HEMOGLOBIN: 10.4 g/dL — AB (ref 12.0–15.0)
LYMPHS ABS: 1.9 10*3/uL (ref 0.7–4.0)
Lymphocytes Relative: 26 % (ref 12–46)
MCH: 24.3 pg — ABNORMAL LOW (ref 26.0–34.0)
MCHC: 31 g/dL (ref 30.0–36.0)
MCV: 78.3 fL (ref 78.0–100.0)
MONOS PCT: 12 % (ref 3–12)
Monocytes Absolute: 0.8 10*3/uL (ref 0.1–1.0)
NEUTROS ABS: 4.4 10*3/uL (ref 1.7–7.7)
NEUTROS PCT: 60 % (ref 43–77)
PLATELETS: 383 10*3/uL (ref 150–400)
RBC: 4.28 MIL/uL (ref 3.87–5.11)
RDW: 15.8 % — ABNORMAL HIGH (ref 11.5–15.5)
WBC: 7.2 10*3/uL (ref 4.0–10.5)

## 2014-01-10 LAB — COMPREHENSIVE METABOLIC PANEL
ALK PHOS: 71 U/L (ref 39–117)
ALT: 31 U/L (ref 0–35)
AST: 17 U/L (ref 0–37)
Albumin: 2 g/dL — ABNORMAL LOW (ref 3.5–5.2)
BILIRUBIN TOTAL: 0.3 mg/dL (ref 0.3–1.2)
BUN: 3 mg/dL — AB (ref 6–23)
CHLORIDE: 102 meq/L (ref 96–112)
CO2: 28 mEq/L (ref 19–32)
Calcium: 8.4 mg/dL (ref 8.4–10.5)
Creatinine, Ser: 0.61 mg/dL (ref 0.50–1.10)
GFR calc Af Amer: >90 mL/min (ref >90)
GFR calc non Af Amer: 82 mL/min — ABNORMAL LOW (ref >90)
Glucose, Bld: 86 mg/dL (ref 70–99)
POTASSIUM: 3.6 meq/L — AB (ref 3.7–5.3)
SODIUM: 141 meq/L (ref 137–147)
TOTAL PROTEIN: 4.6 g/dL — AB (ref 6.0–8.3)

## 2014-01-10 LAB — CLOSTRIDIUM DIFFICILE BY PCR: Toxigenic C. Difficile by PCR: NEGATIVE

## 2014-01-10 MED ORDER — SODIUM CHLORIDE 0.9 % IJ SOLN
10.0000 mL | INTRAMUSCULAR | Status: DC | PRN
  Administered 2014-01-10: 20 mL
  Administered 2014-01-13 – 2014-01-14 (×4): 10 mL
  Administered 2014-01-17 (×2): 20 mL

## 2014-01-10 MED ORDER — BOOST / RESOURCE BREEZE PO LIQD
1.0000 | Freq: Two times a day (BID) | ORAL | Status: DC
  Administered 2014-01-10 – 2014-01-12 (×2): 1 via ORAL

## 2014-01-10 MED ORDER — POTASSIUM CHLORIDE CRYS ER 10 MEQ PO TBCR
10.0000 meq | EXTENDED_RELEASE_TABLET | Freq: Two times a day (BID) | ORAL | Status: DC
  Administered 2014-01-10 – 2014-01-18 (×17): 10 meq via ORAL
  Filled 2014-01-10 (×21): qty 1

## 2014-01-10 MED ORDER — ZOLPIDEM TARTRATE 5 MG PO TABS
5.0000 mg | ORAL_TABLET | Freq: Every evening | ORAL | Status: DC | PRN
  Administered 2014-01-10 – 2014-01-17 (×8): 5 mg via ORAL
  Filled 2014-01-10 (×8): qty 1

## 2014-01-10 NOTE — H&P (View-Only) (Signed)
Physical Medicine and Rehabilitation Admission H&P      Chief Complaint   Patient presents with   .  Deconditioning due to multiple medical issues.       HPI: [name] is a 80 y.o. female with history of A fib, HTN, esophageal cancer s/p esophagectomy with gastric pull thorough, severe GERD, gastroparesis, recurrent aspiration PNA for past 6 months; who was found by her neighbor in her garden tub on 01/06/14. She reported having tripped and fallen in the tub about 12 hours prior to being found. Family reported recent cold type symptoms for the past few weeks with decrease in po intake as well as recent d/c of coumadin due to hematemesis. CT head without acute changes. CT cervical spine with mild spinal stenosis with cord flattening and dilated esophagus. She was started on IV antibiotics for LLL aspiration PNA and therapies ordered. Digoxin discontinued as family question A fib history. She develop A Fib with RVR 150-170 requiring IV metoprolol and amiodarone. 2D echo with EF 60-65% and    Patient with gait disorder and had been referred to outpatient PT but had to stop that due to medical issues. She continues to have episodic hemoptysis and is a fall risk therefore coumadin and ASA discontinued with recommendations to follow up with cardiology on outpatient basis for input on plavix.  Urine Culture with 75,000 E coli and sputum culture with few E coli.  Blood cultures pending. PICC placed for IV antibiotic. Therapies initiated and limtied by tachycardia. CIR recommended by MD and rehab team and patient admitted today.     Patient is full code. Wants everything done possible but does not want to be "kept alive if there's no hope" Chronic tremor in the right upper extremity, was evaluated by neurology. Does not have Parkinson's disease. Tremor most likely secondary to Reglan Also has had neck tilting towards the left side since her surgery for the esophageal cancer  Review of Systems   HENT: Negative for hearing loss.   Eyes: Negative for blurred vision and double vision.  Respiratory: Positive for cough, hemoptysis (ongoing), sputum production and shortness of breath. Negative for wheezing.   Cardiovascular: Positive for leg swelling. Negative for chest pain and palpitations.  Gastrointestinal: Positive for heartburn and diarrhea (today). Negative for abdominal pain.  Genitourinary: Positive for urgency and frequency.  Musculoskeletal: Positive for back pain, myalgias and neck pain (chronic neck pain> back pain).  Neurological: Positive for weakness. Negative for dizziness, speech change and headaches.  Psychiatric/Behavioral: Positive for memory loss. Negative for depression. The patient does not have insomnia.   Past Medical History   Diagnosis  Date   .  GERD (gastroesophageal reflux disease)     .  Osteoporosis     .  Postmenopausal HRT (hormone replacement therapy)     .  Anemia     .  Hypertension     .  Allergy     .  Asthma     .  Shortness of breath     .  PONV (postoperative nausea and vomiting)     .  Dysrhythmia         a fib   .  Esophageal cancer         removed esophagus stomach pulled up   .  Basal cell carcinoma of face         "several; freezes them off"   .  Pneumonia         "all the  time; probably 5 times in the last year, counting today" (01/06/2014)   .  Chronic bronchitis         "practically q winter" (01/06/2014)   .  History of blood transfusion  1993       "related to esophagus removal" (01/06/2014)   .  Headache(784.0)         "monthly in the past year" (01/06/2014)   .  Arthritis         "legs" (01/06/2014)    Past Surgical History   Procedure  Laterality  Date   .  Left oophorectomy    1952   .  Esophageal removal of cancer,gastric ppull-thru 1993       .  Forearm fracture surgery  Right  ?1990's   .  Esophagogastroduodenoscopy    10/19/2011       Procedure: ESOPHAGOGASTRODUODENOSCOPY (EGD);  Surgeon: Milus Banister, MD;  Location:  [facility];  Service: Endoscopy;  Laterality: N/A;   .  Balloon dilation    10/19/2011       Procedure: BALLOON DILATION;  Surgeon: Milus Banister, MD;  Location: [facility]; Service: Endoscopy;  Laterality: N/A;   .  Esophagogastroduodenoscopy    11/07/2011       Procedure: ESOPHAGOGASTRODUODENOSCOPY (EGD);  Surgeon: Ladene Artist, MD,FACG;  Location: [facility];  Service: Endoscopy;  Laterality: N/A;   .  Esophagogastroduodenoscopy    11/22/2011       Procedure: ESOPHAGOGASTRODUODENOSCOPY (EGD);  Surgeon: Inda Castle, MD;  Location: [facility];  Service: Endoscopy;  Laterality: N/A;   .  Esophagogastroduodenoscopy    11/24/2011       Procedure: ESOPHAGOGASTRODUODENOSCOPY (EGD);  Surgeon: Inda Castle, MD;  Location: [facility];  Service: Endoscopy;  Laterality: N/A;  with removable duodenal stent (actually using esophageal partially covered 23X15 located in locked supply room   .  Duodenal stent placement    11/24/2011       Procedure: DUODENAL STENT PLACEMENT;  Surgeon: Inda Castle, MD;  Location: [facility];  Service: Endoscopy;  Laterality: N/A;   .  Video bronchoscopy  Bilateral  01/18/2013       Procedure: VIDEO BRONCHOSCOPY WITHOUT FLUORO;  Surgeon: Tanda Rockers, MD;  Location: [facility];  Service: Cardiopulmonary;  Laterality: Bilateral;   .  Esophagogastroduodenoscopy  N/A  02/11/2013       Procedure: ESOPHAGOGASTRODUODENOSCOPY (EGD);  Surgeon: Inda Castle, MD;  Location: [facility];  Service: Endoscopy;  Laterality: N/A;   .  Duodenal stent placement  N/A  02/11/2013       Procedure: DUODENAL STENT PLACEMENT;  Surgeon: Inda Castle, MD;  Location: [facility];  Service: Endoscopy;  Laterality: N/A;   .  Esophagogastroduodenoscopy  N/A  09/11/2013       Procedure: ESOPHAGOGASTRODUODENOSCOPY (EGD);  Surgeon: Gatha Mayer, MD;  Location: [facility];  Service: Endoscopy;  Laterality: N/A;   .  Cholecystectomy    ~ 1996   .  Dilation and curettage of  uterus    X 6-7   .  Cataract extraction w/ intraocular lens  implant, bilateral  Bilateral  ~ 2000    Family History   Problem  Relation  Age of Onset   .  Cervical cancer  Mother     .  Heart disease  Father         MI   .  Coronary artery disease  Brother     .  Hypotension  Sister     .  Colon cancer  Neg Hx     .  Colon polyps  Sister     .  Pancreatic cancer           1/2 sister   .  Coronary artery disease  Sister         pacemaker   .  Asthma  Brother     .  Asthma  Sister      Social History: Divorced. 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 Has two nieces who call her daily. She reports that she has never smoked. She has never used smokeless tobacco. She reports that she does not drink alcohol or use illicit drugs      Allergies   Allergen  Reactions   .  Erythromycin Ethylsuccinate         Irregular pulse rate   .  Prednisone         "makes me hyper"   .  Doxycycline  Rash    Medications Prior to Admission   Medication  Sig  Dispense  Refill   .  acetaminophen (TYLENOL) 500 MG tablet  Take 1 tablet (500 mg total) by mouth every 6 (six) hours as needed for headache.   30 tablet      .  albuterol (PROVENTIL HFA;VENTOLIN HFA) 108 (90 BASE) MCG/ACT inhaler  Inhale 2 puffs into the lungs every 6 (six) hours as needed. For wheezing   1 Inhaler   4   .  albuterol (PROVENTIL) (2.5 MG/3ML) 0.083% nebulizer solution  Take 3 mLs (2.5 mg total) by nebulization every 4 (four) hours as needed for wheezing or shortness of breath.   75 mL   12   .  arformoterol (BROVANA) 15 MCG/2ML NEBU  Take 2 mLs (15 mcg total) by nebulization 2 (two) times daily. Dx: 493.00   120 mL      .  budesonide (PULMICORT) 0.25 MG/2ML nebulizer solution  Take 2 mLs (0.25 mg total) by nebulization 2 (two) times daily. Dx: 493.00   60 mL      .  chlorpheniramine-HYDROcodone (TUSSIONEX PENNKINETIC ER) 10-8 MG/5ML LQCR  Take 5 mLs by mouth every 12 (twelve) hours as needed for  cough.   480 mL   0   .  digoxin (LANOXIN) 0.125 MG tablet  Take 1 tablet (0.125 mg total) by mouth daily.   90 tablet   3   .  Fe Fum-FA-B Cmp-C-Zn-Mg-Mn-Cu (HEMOCYTE PLUS) 106-1 MG CAPS  Take 1 tablet by mouth 2 (two) times daily.         .  furosemide (LASIX) 40 MG tablet  Take 1 tablet (40 mg total) by mouth daily.   30 tablet   0   .  Methylcobalamin (B-12) 5000 MCG TBDP  Take 1 tablet by mouth daily.         .  Multiple Vitamins-Calcium (VIACTIV MULTI-VITAMIN) CHEW  Chew 1 each by mouth daily.      0   .  Olopatadine HCl (PATADAY) 0.2 % SOLN  Apply 1-2 drops to eye 2 (two) times daily as needed (dry, itchy eyes.).         Marland Kitchen  promethazine (PHENERGAN) 25 MG suppository  Place 25 mg rectally every 6 (six) hours as needed for nausea or vomiting.         .  traMADol (ULTRAM) 50 MG tablet  Take 1 tablet (50 mg total) by mouth every 8 (eight) hours as needed for  moderate pain.   30 tablet   0   .  Vitamin D, Ergocalciferol, (DRISDOL) 50000 UNITS CAPS capsule  Take 1 capsule (50,000 Units total) by mouth every 7 (seven) days. On Friday   30 capsule   3   .  zolpidem (AMBIEN) 5 MG tablet  Take 5 mg by mouth at bedtime.         .  [DISCONTINUED] dextromethorphan-guaiFENesin (MUCINEX DM) 30-600 MG per 12 hr tablet  Take 0.5 tablets by mouth 2 (two) times daily.         .  [DISCONTINUED] metoCLOPramide (REGLAN) 5 MG tablet  Take 1 tablet (5 mg total) by mouth 2 (two) times daily.   180 tablet   3   .  [DISCONTINUED] sucralfate (CARAFATE) 1 GM/10ML suspension  Take 1 g by mouth 2 (two) times daily.            Home: Home Living Family/patient expects to be discharged to:: Private residence Living Arrangements: Alone Available Help at Discharge: Available PRN/intermittently;Other (Comment);Neighbor;Family Type of Home: House Home Access: Stairs to enter [employer] of Steps: 5 Entrance Stairs-Rails: Right Home Layout: One level Home Equipment: Walker - 2 wheels;Shower seat - built  in;Grab bars - toilet;Grab bars - tub/shower;Hand held shower head    Functional History: Prior Function Level of Independence: Independent with assistive device(s) Comments: sleeps in recliner at home. Pt drives. Pt also reports that she had assistance at home 2x/week for 4 hours until recently "She didn't do, much, I can do everything myself, I didn't need her any more." Had not been using walker.   Functional Status:   Mobility: Bed Mobility Overal bed mobility: Needs Assistance Bed Mobility: Supine to Sit;Sit to Supine Supine to sit: Min assist Sit to supine: Min assist General bed mobility comments: Assist to bring feet up onto bed. Transfers Overall transfer level: Needs assistance Equipment used: Rolling walker (2 wheeled) Transfers: Sit to/from Stand Sit to Stand: Min assist Stand pivot transfers: Min assist General transfer comment: Verbal cues for hand placement and assist for balance. Ambulation/Gait Ambulation/Gait assistance: Min assist Ambulation Distance (Feet): 150 Feet Assistive device: Rolling walker (2 wheeled) Gait Pattern/deviations: Step-through pattern;Narrow base of support General Gait Details: Pt with occasional posterior stagger.   ADL: ADL Overall ADL's : Needs assistance/impaired Grooming: Wash/dry hands;Wash/dry face;Set up;Sitting Upper Body Bathing: Set up;Sitting Lower Body Bathing: Moderate assistance;Sit to/from stand Lower Body Dressing: Set up;Moderate assistance;Sit to/from stand Toilet Transfer: Moderate assistance;BSC;Stand-pivot;Ambulation;Cueing for safety;Cueing for sequencing Toilet Transfer Details (indicate cue type and reason): Pt transferred to 3:1 x2 during this therapy session, pt demonstrates posterior & left lateral lean with sit to stand noted and requires physical assist to correct. Toileting- Clothing Manipulation and Hygiene: Minimal assistance;Sitting/lateral lean Functional mobility during ADLs: Minimal  assistance;Moderate assistance;Cueing for safety;Cueing for sequencing General ADL Comments: Pt was educated in role of OT and participated in ADL retraining session after assessment today. Pt appears to fatigue from coughing during minimal activity. Pt currently requires physical assist for all ADL's and transfers, rec CIR consult/rehab secondary to lives alone & was independent PTA. Pt was sitting up in chair, w/ call bell/phone in reach, eating breakfast and RN in room at conclusion of session (Pt was educated in role of OT and participated in ADL retraining session after assessment today. Pt appears to fatigue from coughing during minimal activity. Pt currently requires physical assist for all ADL's and transfers, rec CIR consult/rehab seconda)   Cognition: Cognition Overall Cognitive Status:  Within Functional Limits for tasks assessed Orientation Level: Oriented X4 Cognition Arousal/Alertness: Awake/alert Behavior During Therapy: WFL for tasks assessed/performed Overall Cognitive Status: Within Functional Limits for tasks assessed   Physical Exam: Blood pressure 101/69, pulse 87, temperature 97.9 F (36.6 C), temperature source Oral, resp. rate 16, height 5' (1.524 m), weight 52.5 kg (115 lb 11.9 oz), SpO2 98.00%. Physical Exam  Nursing note and vitals reviewed. Constitutional: She is oriented to person, place, and time.  Frail, elderly female and keeps neck tilted to left chronically  HENT:   Head: Normocephalic and atraumatic.  Eyes: Conjunctivae are normal. Pupils are equal, round, and reactive to light.  Cardiovascular: An irregularly irregular rhythm present. Tachycardia present.   Respiratory: Effort normal. No respiratory distress. She has decreased breath sounds in the left lower field. She has rhonchi in the right upper field, the right middle field and the left middle field.  Has paroxsymal episodic productive coughing spells during conversation.   GI: Soft. Bowel sounds  are normal. She exhibits no distension. There is no tenderness.  Musculoskeletal: She exhibits edema (2+ pitting edema BLE tibially).  Resting tremor RUE (tardive dyskinesia)   Neurological: She is alert and oriented to person, place, and time.  Pleasant and appropriate. Follows commands without difficulty.   Skin: Skin is warm and dry.  Large bruise upper mid thoracic spine.   Psychiatric: She has a normal mood and affect. Her behavior is normal. Thought content normal.    Laterocollis to Left  Motor strength is 4/5 bilateral deltoid, bicep, tricep, grip, hip flexor, knee extensor, ankle dorsiflexor and plantar flexor Results for orders placed during the hospital encounter of 01/06/14 (from the past 48 hour(s))   GLUCOSE, CAPILLARY     Status: None     Collection Time      01/07/14  4:58 PM       Result  Value  Ref Range     Glucose-Capillary  99   70 - 99 mg/dL     Comment 1  Documented in Chart        Comment 2  Notify RN      GLUCOSE, CAPILLARY     Status: Abnormal     Collection Time      01/07/14  9:14 PM       Result  Value  Ref Range     Glucose-Capillary  124 (*)  70 - 99 mg/dL     Comment 1  Documented in Chart        Comment 2  Notify RN      GLUCOSE, CAPILLARY     Status: None     Collection Time      01/08/14  6:13 AM       Result  Value  Ref Range     Glucose-Capillary  89   70 - 99 mg/dL   BASIC METABOLIC PANEL     Status: Abnormal     Collection Time      01/08/14  9:12 AM       Result  Value  Ref Range     Sodium  141   137 - 147 mEq/L     Potassium  3.5 (*)  3.7 - 5.3 mEq/L     Chloride  106   96 - 112 mEq/L     CO2  23   19 - 32 mEq/L     Glucose, Bld  164 (*)  70 - 99 mg/dL     BUN  6   6 - 23 mg/dL     Creatinine, Ser  0.63   0.50 - 1.10 mg/dL     Calcium  7.8 (*)  8.4 - 10.5 mg/dL     GFR calc non Af Amer  81 (*)  >90 mL/min     GFR calc Af Amer  >90   >90 mL/min     Comment:  (NOTE)        The eGFR has been calculated using the CKD EPI equation.         This calculation has not been validated in all clinical situations.        eGFR's persistently <90 mL/min signify possible Chronic Kidney        Disease.   MAGNESIUM     Status: None     Collection Time      01/08/14  9:12 AM       Result  Value  Ref Range     Magnesium  2.0   1.5 - 2.5 mg/dL   GLUCOSE, CAPILLARY     Status: Abnormal     Collection Time      01/08/14 11:32 AM       Result  Value  Ref Range     Glucose-Capillary  119 (*)  70 - 99 mg/dL     Comment 1  Notify RN      GLUCOSE, CAPILLARY     Status: None     Collection Time      01/08/14  4:55 PM       Result  Value  Ref Range     Glucose-Capillary  93   70 - 99 mg/dL   GLUCOSE, CAPILLARY     Status: Abnormal     Collection Time      01/08/14  8:43 PM       Result  Value  Ref Range     Glucose-Capillary  100 (*)  70 - 99 mg/dL     Comment 1  Documented in Chart        Comment 2  Notify RN      GLUCOSE, CAPILLARY     Status: None     Collection Time      01/09/14  6:49 AM       Result  Value  Ref Range     Glucose-Capillary  88   70 - 99 mg/dL   BASIC METABOLIC PANEL     Status: Abnormal     Collection Time      01/09/14  9:15 AM       Result  Value  Ref Range     Sodium  143   137 - 147 mEq/L     Potassium  3.7   3.7 - 5.3 mEq/L     Chloride  107   96 - 112 mEq/L     CO2  26   19 - 32 mEq/L     Glucose, Bld  102 (*)  70 - 99 mg/dL     BUN  5 (*)  6 - 23 mg/dL     Creatinine, Ser  0.61   0.50 - 1.10 mg/dL     Calcium  8.6   8.4 - 10.5 mg/dL     GFR calc non Af Amer  82 (*)  >90 mL/min     GFR calc Af Amer  >90   >90 mL/min     Comment:  (NOTE)  The eGFR has been calculated using the CKD EPI equation.        This calculation has not been validated in all clinical situations.        eGFR's persistently <90 mL/min signify possible Chronic Kidney        Disease.   GLUCOSE, CAPILLARY     Status: Abnormal     Collection Time      01/09/14 11:18 AM       Result  Value  Ref Range      Glucose-Capillary  109 (*)  70 - 99 mg/dL     Comment 1  Notify RN       Dg Chest Port 1 View   01/08/2014   CLINICAL DATA:  PICC placement.  Recurrent atrial fibrillation.  EXAM: PORTABLE CHEST - 1 VIEW  COMPARISON:  01/06/2014  FINDINGS: PICC has been inserted and the tip is at the level of the carina in the superior vena cava in good position. There is persistent prominence of the cardiomediastinal silhouette. The patient has a known gastric pull-through with gastroesophageal stent due to esophageal cancer.  The area of infiltrate/atelectasis at the left lung base has slightly increased and could represent pneumonia or aspiration pneumonitis.  The pulmonary vascularity is normal. Right lung is clear. No acute osseous abnormalities.  IMPRESSION: 1. PICC is in good position with the tip at the level of the carina. 2. Slight increased atelectasis/infiltrate at the left lung base as described above.   Electronically Signed   By: Rozetta Nunnery M.D.   On: 01/08/2014 15:19           Medical Problem List and Plan: 1. Functional deficits secondary to deconditioning after pneumonia, and multiple medical issues. Recent fall with trunk,soft tissue trauma   2.  DVT Prophylaxis/Anticoagulation: Mechanical: Antiembolism stockings, knee (TED hose) Bilateral lower extremities Sequential compression devices, below knee Bilateral lower extremities 3. Pain Management:  Will continue tylenol tid schedule with tramadol prn for moderate to severe pain.   4. Mood: Has a positive outlook. Will have LCSW follow for evaluation and support.   5. Neuropsych: This patient is capable of making decisions on her own behalf. 6. Recurrent Aspiration PNA/E coli UTI: Narrow antibiotics to rocephin as sensitivities available. Will have speech evaluate swallow to decide on appropriate diet. Check follow up CXR in am.   7. Leucocytosis: will recheck in am. 8. A fib:Now on amiodarone as well as digoxin for rate control.   9.  Chronic cough: will schedule Tessalon perles. Use tussionex bid prn severe cough.    10  Esophageal cancer s/p resection with severe GERD/duodenal stent: Continue home regimen of PPI bid, Carafate and Reglan.  11. Diarrhea: Likely antibiotic associated. Will order probiotic. Check c diff.   12. Chronic bronchitis with asthma: Continue nebs bid. Will continue Mucinex DM as well as IS while awake.     Post Admission Physician Evaluation: Functional deficits secondary  to deconditioning after pneumonia, and multiple medical issues. Recent fall with trunk,soft tissue trauma Patient is admitted to receive collaborative, interdisciplinary care between the physiatrist, rehab nursing staff, and therapy team. Patient's level of medical complexity and substantial therapy needs in context of that medical necessity cannot be provided at a lesser intensity of care such as a SNF. Patient has experienced substantial functional loss from his/her baseline which was documented above under the "Functional History" and "Functional Status" headings.  Judging by the patient's diagnosis, physical exam, and functional history, the patient has potential  for functional progress which will result in measurable gains while on inpatient rehab.  These gains will be of substantial and practical use upon discharge  in facilitating mobility and self-care at the household level. Physiatrist will provide 24 hour management of medical needs as well as oversight of the therapy plan/treatment and provide guidance as appropriate regarding the interaction of the two. 24 hour rehab nursing will assist with bladder management, bowel management, safety, skin/wound care, disease management and medication administration  and help integrate therapy concepts, techniques,education, etc. PT will assess and treat for/with: pre gait, gait training, endurance , safety, equipment, neuromuscular re education.   Goals are: Sup. OT will assess and treat  for/with: ADLs, Cognitive perceptual skills, Neuromuscular re education, safety, endurance, equipment.   Goals are: Sup. SLP will assess and treat for/with: NA.  Goals are: NA. Case Management and Social Worker will assess and treat for psychological issues and discharge planning. Team conference will be held weekly to assess progress toward goals and to determine barriers to discharge. Patient will receive at least 3 hours of therapy per day at least 5 days per week. ELOS: 8-12 Days        Prognosis:  good  [name] M.D. [facility] FAAPM&R (Sports Med, Neuromuscular Med) Diplomate Am Board of Electrodiagnostic Med  01/09/2014

## 2014-01-10 NOTE — Evaluation (Signed)
Speech Language Pathology Assessment and Plan  Patient Details  Name: [name] MRN: [id number] Date of Birth: [birth year]  SLP Diagnosis: Other (comment) (pending MBS)  Rehab Potential: Good ELOS: 7-10 days    Today's Date: 01/10/2014 Time: 1100-1135 Time Calculation (min): 35 min  Problem List:  Patient Active Problem List   Diagnosis Date Noted  . Multifactorial gait disorder 01/09/2014  . Fall at home 01/06/2014  . Chronic diastolic CHF (congestive heart failure) 09/15/2013  . Pulmonary hypertension 09/15/2013  . Hematemesis 09/10/2013  . Acute blood loss anemia 09/10/2013  . CAP (community acquired pneumonia) 09/09/2013  . Sepsis 09/09/2013  . PNA (pneumonia) 09/09/2013  . A-fib 09/09/2013  . Unspecified asthma(493.90) 09/09/2013  . Encounter for therapeutic drug monitoring 09/06/2013  . Hx of esophagectomy 08/02/2013  . Anticoagulant long-term use 08/02/2013  . Aspiration pneumonia 07/26/2013  . Tremor 05/29/2013  . Edema 05/14/2013  . Aortic regurgitation 05/14/2013  . Hemoptysis 01/15/2013  . Atrial fibrillation with rapid ventricular response 11/23/2011  . Gastric outlet obstruction 11/05/2011  . Esophageal stricture 10/19/2011  . Asthma with bronchitis 11/24/2010  . HX OF ESOPHAGEAL CANCER 08/29/2008  . HYPERTENSION 06/14/2007  . ANEMIA-IRON DEFICIENCY 03/14/2007  . GERD 03/13/2007   Past Medical History:  Past Medical History  Diagnosis Date  . GERD (gastroesophageal reflux disease)   . Osteoporosis   . Postmenopausal HRT (hormone replacement therapy)   . Anemia   . Hypertension   . Allergy   . Asthma   . Shortness of breath   . PONV (postoperative nausea and vomiting)   . Dysrhythmia     a fib  . Esophageal cancer     removed esophagus stomach pulled up  . Basal cell carcinoma of face     "several; freezes them off"  . Pneumonia     "all the time; probably 5 times in the last year, counting today" (01/06/2014)  . Chronic bronchitis    "practically q winter" (01/06/2014)  . History of blood transfusion 1993    "related to esophagus removal" (01/06/2014)  . Headache(784.0)     "monthly in the past year" (01/06/2014)  . Arthritis     "legs" (01/06/2014)   Past Surgical History:  Past Surgical History  Procedure Laterality Date  . Left oophorectomy  1952  . Esophageal removal of cancer,gastric ppull-thru 1993    . Forearm fracture surgery Right ?1990's  . Esophagogastroduodenoscopy  10/19/2011    Procedure: ESOPHAGOGASTRODUODENOSCOPY (EGD);  Surgeon: Milus Banister, MD;  Location: [facility];  Service: Endoscopy;  Laterality: N/A;  . Balloon dilation  10/19/2011    Procedure: BALLOON DILATION;  Surgeon: Milus Banister, MD;  Location: [facility];  Service: Endoscopy;  Laterality: N/A;  . Esophagogastroduodenoscopy  11/07/2011    Procedure: ESOPHAGOGASTRODUODENOSCOPY (EGD);  Surgeon: Ladene Artist, MD,FACG;  Location: [facility]COPY;  Service: Endoscopy;  Laterality: N/A;  . Esophagogastroduodenoscopy  11/22/2011    Procedure: ESOPHAGOGASTRODUODENOSCOPY (EGD);  Surgeon: Inda Castle, MD;  Location: [facility];  Service: Endoscopy;  Laterality: N/A;  . Esophagogastroduodenoscopy  11/24/2011    Procedure: ESOPHAGOGASTRODUODENOSCOPY (EGD);  Surgeon: Inda Castle, MD;  Location: [facility];  Service: Endoscopy;  Laterality: N/A;  with removable duodenal stent (actually using esophageal partially covered 23X15 located in locked supply room  . Duodenal stent placement  11/24/2011    Procedure: DUODENAL STENT PLACEMENT;  Surgeon: Inda Castle, MD;  Location: [facility];  Service: Endoscopy;  Laterality: N/A;  . Video bronchoscopy Bilateral 01/18/2013  Procedure: VIDEO BRONCHOSCOPY WITHOUT FLUORO;  Surgeon: Tanda Rockers, MD;  Location: [facility];  Service: Cardiopulmonary;  Laterality: Bilateral;  . Esophagogastroduodenoscopy N/A 02/11/2013    Procedure: ESOPHAGOGASTRODUODENOSCOPY (EGD);  Surgeon: Inda Castle, MD;   Location: [facility];  Service: Endoscopy;  Laterality: N/A;  . Duodenal stent placement N/A 02/11/2013    Procedure: DUODENAL STENT PLACEMENT;  Surgeon: Inda Castle, MD;  Location: [facility];  Service: Endoscopy;  Laterality: N/A;  . Esophagogastroduodenoscopy N/A 09/11/2013    Procedure: ESOPHAGOGASTRODUODENOSCOPY (EGD);  Surgeon: Gatha Mayer, MD;  Location: [facility];  Service: Endoscopy;  Laterality: N/A;  . Cholecystectomy  ~ 1996  . Dilation and curettage of uterus  X 6-7  . Cataract extraction w/ intraocular lens  implant, bilateral Bilateral ~ 2000    Assessment / Plan / Recommendation Clinical Impression  [name] is an 80 y.o. female with history of A fib, HTN, esophageal cancer s/p esophagectomy with gastric pull through, severe GERD, gastroparesis, recurrent aspiration PNA for past 6 months; who was found by her neighbor in her garden tub on 01/06/14. She reported having tripped and fallen in the tub about 12 hours prior to being found. Family reported recent cold type symptoms for the past few weeks with decrease in po intake as well as recent d/c of coumadin due to hematemesis. CT head without acute changes.  She was started on IV antibiotics for LLL aspiration PNA and therapies ordered. Has had neck tilting towards the left side since her surgery for the esophageal cancer.  Pt was admitted to [facility] 01/09/14.  A bedside swallow eval was completed on 01/10/14 with the following results:  Pt presents with no overt s/s of aspiration at bedside across any consistencies assessed including thin liquids via straw, purees, solids, and mixed consistencies. Pt's vocal quality remained clear and strong during PO trials and her oral motor exam was unremarkable for any focal weakness.  Recommend that pt continue on regular solids and thin liquids; however, pt would benefit from an objective evaluation of swallowing function given her history of GERD, esophageal cancer and  esophagectomy, and recurrent aspiration pneumonia over the past 6 months.  Primary SLP will plan to follow up for MBS at next available appointment.  Pt would benefit from skilled speech therapy while inpatient to maximize swallowing safety and reduce burden of care upon discharge.      Skilled Therapeutic Interventions          Cognitive-linguistic evaluation completed with results and recommendations reviewed with family.     SLP Assessment  Patient will need skilled Speech Lanaguage Pathology Services during CIR admission    Recommendations  Recommended Consults: MBS;Consider esophageal assessment;Consider GI evaluation Diet Recommendations: Regular;Thin liquid Liquid Administration via: Straw Supervision: Full supervision/cueing for compensatory strategies (RN reports family requests full supervision with meals) Compensations: Slow rate;Follow solids with liquid;Small sips/bites Postural Changes and/or Swallow Maneuvers: Out of bed for meals;Seated upright 90 degrees;Upright 30-60 min after meal Oral Care Recommendations: Oral care BID Patient destination: Home Follow up Recommendations: None    SLP Frequency 3 out of 7 days   SLP Treatment/Interventions Dysphagia/aspiration precaution training    Pain Pain Assessment Pain Assessment: No/denies pain  Prior Functioning Cognitive/Linguistic Baseline: Within functional limits Type of Home: House  Lives With: Alone Available Help at Discharge: Available PRN/intermittently;Neighbor;Family  Short Term Goals: Week 1: SLP Short Term Goal 1 (Week 1): Pt will tolerate her currently prescribed diet with no overt s/s of aspiration with  modified independence.  SLP Short Term Goal 2 (Week 1): Pt will participate in an objective swallow study to determine safest diet consistency.    See FIM for current functional status Refer to Care Plan for Long Term Goals  Recommendations for other services: None  Discharge Criteria: Patient will be  discharged from SLP if patient refuses treatment 3 consecutive times without medical reason, if treatment goals not met, if there is a change in medical status, if patient makes no progress towards goals or if patient is discharged from hospital.  The above assessment, treatment plan, treatment alternatives and goals were discussed and mutually agreed upon: by patient  Windell Moulding, M.A. CCC-SLP   Caulin Begley, Selinda Orion 01/10/2014, 1:24 PM

## 2014-01-10 NOTE — Progress Notes (Signed)
Patient information reviewed and entered into eRehab system by Anushka Hartinger, RN, CRRN, PPS Coordinator.  Information including medical coding and functional independence measure will be reviewed and updated through discharge.     Per nursing patient was given "Data Collection Information Summary for Patients in Inpatient Rehabilitation Facilities with attached "Privacy Act Statement-Health Care Records" upon admission.  

## 2014-01-10 NOTE — Evaluation (Addendum)
Occupational Therapy Assessment and Plan  Patient Details  Name: [name] MRN: [id number] Date of Birth: 07-Jul-1929  OT Diagnosis: acute pain and muscle weakness (generalized) Rehab Potential:   ELOS: 7-10 days 7-10 days  Today's Date: 01/10/2014 Time: 1000-1045 Time Calculation (min): 45 min  Problem List:  Patient Active Problem List   Diagnosis Date Noted  . Multifactorial gait disorder 01/09/2014  . Fall at home 01/06/2014  . Chronic diastolic CHF (congestive heart failure) 09/15/2013  . Pulmonary hypertension 09/15/2013  . Hematemesis 09/10/2013  . Acute blood loss anemia 09/10/2013  . CAP (community acquired pneumonia) 09/09/2013  . Sepsis 09/09/2013  . PNA (pneumonia) 09/09/2013  . A-fib 09/09/2013  . Unspecified asthma(493.90) 09/09/2013  . Encounter for therapeutic drug monitoring 09/06/2013  . Hx of esophagectomy 08/02/2013  . Anticoagulant long-term use 08/02/2013  . Aspiration pneumonia 07/26/2013  . Tremor 05/29/2013  . Edema 05/14/2013  . Aortic regurgitation 05/14/2013  . Hemoptysis 01/15/2013  . Atrial fibrillation with rapid ventricular response 11/23/2011  . Gastric outlet obstruction 11/05/2011  . Esophageal stricture 10/19/2011  . Asthma with bronchitis 11/24/2010  . HX OF ESOPHAGEAL CANCER 08/29/2008  . HYPERTENSION 06/14/2007  . ANEMIA-IRON DEFICIENCY 03/14/2007  . GERD 03/13/2007    Past Medical History:  Past Medical History  Diagnosis Date  . GERD (gastroesophageal reflux disease)   . Osteoporosis   . Postmenopausal HRT (hormone replacement therapy)   . Anemia   . Hypertension   . Allergy   . Asthma   . Shortness of breath   . PONV (postoperative nausea and vomiting)   . Dysrhythmia     a fib  . Esophageal cancer     removed esophagus stomach pulled up  . Basal cell carcinoma of face     "several; freezes them off"  . Pneumonia     "all the time; probably 5 times in the last year, counting today" (01/06/2014)  . Chronic  bronchitis     "practically q winter" (01/06/2014)  . History of blood transfusion 1993    "related to esophagus removal" (01/06/2014)  . Headache(784.0)     "monthly in the past year" (01/06/2014)  . Arthritis     "legs" (01/06/2014)   Past Surgical History:  Past Surgical History  Procedure Laterality Date  . Left oophorectomy  1952  . Esophageal removal of cancer,gastric ppull-thru 1993    . Forearm fracture surgery Right ?1990's  . Esophagogastroduodenoscopy  10/19/2011    Procedure: ESOPHAGOGASTRODUODENOSCOPY (EGD);  Surgeon: Milus Banister, MD;  Location: [facility];  Service: Endoscopy;  Laterality: N/A;  . Balloon dilation  10/19/2011    Procedure: BALLOON DILATION;  Surgeon: Milus Banister, MD;  Location: [facility];  Service: Endoscopy;  Laterality: N/A;  . Esophagogastroduodenoscopy  11/07/2011    Procedure: ESOPHAGOGASTRODUODENOSCOPY (EGD);  Surgeon: Ladene Artist, MD,FACG;  Location: [facility]COPY;  Service: Endoscopy;  Laterality: N/A;  . Esophagogastroduodenoscopy  11/22/2011    Procedure: ESOPHAGOGASTRODUODENOSCOPY (EGD);  Surgeon: Inda Castle, MD;  Location: [facility];  Service: Endoscopy;  Laterality: N/A;  . Esophagogastroduodenoscopy  11/24/2011    Procedure: ESOPHAGOGASTRODUODENOSCOPY (EGD);  Surgeon: Inda Castle, MD;  Location: [facility];  Service: Endoscopy;  Laterality: N/A;  with removable duodenal stent (actually using esophageal partially covered 23X15 located in locked supply room  . Duodenal stent placement  11/24/2011    Procedure: DUODENAL STENT PLACEMENT;  Surgeon: Inda Castle, MD;  Location: [facility];  Service: Endoscopy;  Laterality: N/A;  .  Video bronchoscopy Bilateral 01/18/2013    Procedure: VIDEO BRONCHOSCOPY WITHOUT FLUORO;  Surgeon: Nyoka Cowden, MD;  Location: [facility];  Service: Cardiopulmonary;  Laterality: Bilateral;  . Esophagogastroduodenoscopy N/A 02/11/2013    Procedure: ESOPHAGOGASTRODUODENOSCOPY (EGD);  Surgeon: Louis Meckel, MD;  Location: [facility];  Service: Endoscopy;  Laterality: N/A;  . Duodenal stent placement N/A 02/11/2013    Procedure: DUODENAL STENT PLACEMENT;  Surgeon: Louis Meckel, MD;  Location: [facility];  Service: Endoscopy;  Laterality: N/A;  . Esophagogastroduodenoscopy N/A 09/11/2013    Procedure: ESOPHAGOGASTRODUODENOSCOPY (EGD);  Surgeon: Iva Boop, MD;  Location: [facility];  Service: Endoscopy;  Laterality: N/A;  . Cholecystectomy  ~ 1996  . Dilation and curettage of uterus  X 6-7  . Cataract extraction w/ intraocular lens  implant, bilateral Bilateral ~ 2000    Assessment & Plan Clinical Impression: Patient is a 80 y.o. year old female female with history of A fib, HTN, esophageal cancer s/p esophagectomy with gastric pull thorough, severe GERD, gastroparesis, recurrent aspiration PNA for past 6 months; who was found by her neighbor in her garden tub on 01/06/14. She reported having tripped and fallen in the tub about 12 hours prior to being found. Family reported recent cold type symptoms for the past few weeks with decrease in po intake as well as recent d/c of coumadin due to hematemesis. CT head without acute changes. CT cervical spine with mild spinal stenosis with cord flattening and dilated esophagus. She was started on IV antibiotics for LLL aspiration PNA and therapies ordered. Digoxin discontinued as family question A fib history. She develop A Fib with RVR 150-170 requiring IV metoprolol and amiodarone. 2D echo with EF 60-65% and Patient with gait disorder and had been referred to outpatient PT but had to stop that due to medical issues. She continues to have episodic hemoptysis and is a fall risk therefore coumadin and ASA discontinued with recommendations to follow up with cardiology on outpatient basis for input on plavix. Urine Culture with 75,000 E coli and sputum culture with few E coli. Blood cultures pending. PICC placed for IV antibiotic. Therapies initiated  and limtied by tachycardia.   Patient transferred to CIR on 01/09/2014 .    Patient currently requires min to mod A  with basic self-care skills and min A for basic mobility secondary to muscle weakness and acute pain , decreased cardiorespiratoy endurance and decreased standing balance and decreased balance strategies.  Prior to hospitalization, patient could complete ADL with independent .  Patient will benefit from skilled intervention to decrease level of assist with basic self-care skills and increase independence with basic self-care skills prior to discharge home with care partner.  Anticipate patient will require intermittent supervision and follow up home health.  OT - End of Session Endurance Deficit: Yes OT Assessment OT Patient demonstrates impairments in the following area(s): Balance;Edema;Endurance;Motor;Pain;Safety;Skin Integrity OT Basic ADL's Functional Problem(s): Grooming;Bathing;Dressing;Toileting OT Advanced ADL's Functional Problem(s): Simple Meal Preparation OT Transfers Functional Problem(s): Toilet;Tub/Shower OT Additional Impairment(s): None OT Plan OT Intensity: Minimum of 1-2 x/day, 45 to 90 minutes OT Frequency: 5 out of 7 days OT Duration/Estimated Length of Stay: 7-10 days OT Treatment/Interventions: Balance/vestibular training;Community reintegration;Discharge planning;Pain management;Skin care/wound managment;Visual/perceptual remediation/compensation;UE/LE Coordination activities;UE/LE Strength taining/ROM;Self Care/advanced ADL retraining;Functional mobility training;Psychosocial support;Patient/family education;Therapeutic Activities;DME/adaptive equipment instruction OT Basic Self-Care Anticipated Outcome(s): mod I  OT Toileting Anticipated Outcome(s): mod I  OT Bathroom Transfers Anticipated Outcome(s): mod I  OT Recommendation Patient destination: Home Follow Up Recommendations: None  Equipment Recommended: To be determined   Skilled Therapeutic  Intervention 1:1 OT eval initiated with OT goals, purpose and role discussed. Self care retraining at sink level (Pt's choice); focus on sit to stand, standing balance functional ambulation with RW, activity tolerance/ endurance, RW safety, toilet transfers, toileting and d./c planning. With toileting : pt did report she had to go to the bathroom but as we were unfasten pants pt was not able to hold her urine and was going before pants were down and she was seated on the toilet.  Her brief was also wet. Pt reports this is new since this admission. RN made aware     OT Evaluation Precautions/Restrictions    General Chart Reviewed: Yes Amount of Missed OT Time (min): 15 Minutes Family/Caregiver Present: No    Pain Pain Assessment Pain Assessment: No/denies pain Pain Score: 3  Pain Type: Acute pain Pain Location: Arm Pain Orientation: Right Pain Descriptors / Indicators: Discomfort Pain Intervention(s): RN made aware Home Living/Prior Functioning Home Living Family/patient expects to be discharged to:: Private residence Living Arrangements: Alone Available Help at Discharge: Available PRN/intermittently;Neighbor;Family Type of Home: House Home Access: Stairs to enter [employer] of Steps: 5 Entrance Stairs-Rails: Right Home Layout: One level  Lives With: Alone Prior Function Level of Independence: Independent with basic ADLs;Independent with homemaking with ambulation;Independent with gait;Independent with transfers  Able to Take Stairs?: Yes Driving: Yes 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 Pt drives. Pt also reports that she had assistance at home 2x/week for 4 hours until recently "She didn't do, much, I can do everything myself, I didn't need her any more." Had not been using walker.; Hobbies: church, choir, watching sports ADL  see FIM Vision/Perception  Vision-  History Baseline Vision/History: Wears glasses Wears Glasses: Reading only Patient Visual Report: No change from baseline  Cognition Overall Cognitive Status: Within Functional Limits for tasks assessed Arousal/Alertness: Awake/alert Orientation Level: Oriented X4 Attention: Selective Selective Attention: Appears intact Memory: Appears intact Awareness: Appears intact Problem Solving: Appears intact Safety/Judgment: Appears intact Sensation Sensation Light Touch: Appears Intact Proprioception: Appears Intact Coordination Gross Motor Movements are Fluid and Coordinated: Yes Fine Motor Movements are Fluid and Coordinated: Yes Motor  Motor Motor: Within Functional Limits Motor - Skilled Clinical Observations:  weakness Mobility  Transfers Transfers: Sit to Stand;Stand to Sit Sit to Stand: 4: Min guard Stand to Sit: 4: Min guard  Trunk/Postural Assessment  Cervical Assessment Cervical Assessment: Exceptions to [facility] Cervical AROM Overall Cervical AROM Comments: B rotation WFL; Side flexion limited to R  Cervical Strength Overall Cervical Strength Comments: Pt presents w/ L side flexion, R rotation. "I can lift my head up, but its just so weak that I let it go to that side." Thoracic Assessment Thoracic Assessment: Exceptions to [facility] (kyphotic) Lumbar Assessment Lumbar Assessment: Within Functional Limits Postural Control Postural Control: Deficits on evaluation Head Control: decreased; presents w/ L side flexion and R rotation after bout of pneumonia 19m ago.  Righting Reactions: delayed; noted intermittent L Lean and LOB to poster/L directions  Balance Balance Balance Assessed: Yes Standardized Balance Assessment Standardized Balance Assessment: Berg Balance Test Berg Balance Test Sit to Stand: Able to stand without using hands and stabilize independently Standing Unsupported: Able to stand 2 minutes with supervision Sitting with Back Unsupported but Feet Supported on  Floor or Stool: Able to sit safely and securely 2 minutes Stand to Sit: Sits safely with minimal use of hands Transfers:  Able to transfer safely, minor use of hands Standing Unsupported with Eyes Closed: Able to stand 10 seconds with supervision Standing Ubsupported with Feet Together: Able to place feet together independently and stand for 1 minute with supervision From Standing, Reach Forward with Outstretched Arm: Reaches forward but needs supervision From Standing Position, Pick up Object from Floor: Able to pick up shoe, needs supervision From Standing Position, Turn to Look Behind Over each Shoulder: Looks behind one side only/other side shows less weight shift Turn 360 Degrees: Needs close supervision or verbal cueing Standing Unsupported, Alternately Place Feet on Step/Stool: Needs assistance to keep from falling or unable to try Standing Unsupported, One Foot in Front: Able to take small step independently and hold 30 seconds Standing on One Leg: Tries to lift leg/unable to hold 3 seconds but remains standing independently Total Score: 36 Static Sitting Balance Static Sitting - Balance Support: Feet supported;No upper extremity supported Static Sitting - Level of Assistance: 7: Independent Dynamic Sitting Balance Dynamic Sitting - Balance Support: Feet supported;Right upper extremity supported;Left upper extremity supported;Bilateral upper extremity supported;Feet unsupported Dynamic Sitting - Level of Assistance: 6: Modified independent (Device/Increase time);5: Stand by assistance Dynamic Sitting - Balance Activities: Lateral lean/weight shifting;Forward lean/weight shifting;Reaching for objects Static Standing Balance Static Standing - Balance Support: No upper extremity supported;Bilateral upper extremity supported Static Standing - Level of Assistance: 5: Stand by assistance Dynamic Standing Balance Dynamic Standing - Balance Support: Bilateral upper extremity supported;Right  upper extremity supported;Left upper extremity supported Dynamic Standing - Level of Assistance: 4: Min assist;5: Stand by assistance Dynamic Standing - Balance Activities: Forward lean/weight shifting;Lateral lean/weight shifting;Reaching for objects Extremity/Trunk Assessment RUE Assessment RUE Assessment: Within Functional Limits LUE Assessment LUE Assessment: Within Functional Limits  FIM:  FIM - Grooming Grooming Steps: Wash, rinse, dry face;Wash, rinse, dry hands Grooming: 5: Set-up assist to obtain items FIM - Bathing Bathing Steps Patient Completed: Chest;Right Arm;Left Arm;Abdomen;Front perineal area;Right upper leg;Left upper leg;Right lower leg (including foot);Left lower leg (including foot) Bathing: 4: Min-Patient completes 8-9 32f 10 parts or 75+ percent FIM - Upper Body Dressing/Undressing Upper body dressing/undressing steps patient completed: Thread/unthread right sleeve of pullover shirt/dresss;Thread/unthread left sleeve of pullover shirt/dress;Put head through opening of pull over shirt/dress;Pull shirt over trunk Upper body dressing/undressing: 5: Set-up assist to: Obtain clothing/put away FIM - Lower Body Dressing/Undressing Lower body dressing/undressing steps patient completed: Thread/unthread right underwear leg;Thread/unthread left underwear leg;Thread/unthread right pants leg;Thread/unthread left pants leg Lower body dressing/undressing: 2: Max-Patient completed 25-49% of tasks FIM - Toileting Toileting steps completed by patient: Adjust clothing after toileting;Performs perineal hygiene Toileting: 3: Mod-Patient completed 2 of 3 steps FIM - Bed/Chair Transfer Bed/Chair Transfer: 4: Bed > Chair or W/C: Min A (steadying Pt. > 75%) FIM - Toilet Transfers Toilet Transfers: 4-To toilet/BSC: Min A (steadying Pt. > 75%);4-From toilet/BSC: Min A (steadying Pt. > 75%)   Refer to Care Plan for Long Term Goals  Recommendations for other services: None  Discharge  Criteria: Patient will be discharged from OT if patient refuses treatment 3 consecutive times without medical reason, if treatment goals not met, if there is a change in medical status, if patient makes no progress towards goals or if patient is discharged from hospital.  The above assessment, treatment plan, treatment alternatives and goals were discussed and mutually agreed upon: by patient  Nicoletta Ba 01/10/2014, 3:51 PM

## 2014-01-10 NOTE — Evaluation (Signed)
Physical Therapy Assessment and Plan  Patient Details  Name: [name] MRN: [id number] Date of Birth: 01-Jan-1929  PT Diagnosis: Abnormal posture, Abnormality of gait, Muscle weakness and Pain in upper trunk/neck Rehab Potential: Good ELOS: 7-10days   Today's Date: 01/10/2014 Time: 6237-6283 Time Calculation (min): 55 min  Problem List:  Patient Active Problem List   Diagnosis Date Noted  . Multifactorial gait disorder 01/09/2014  . Fall at home 01/06/2014  . Chronic diastolic CHF (congestive heart failure) 09/15/2013  . Pulmonary hypertension 09/15/2013  . Hematemesis 09/10/2013  . Acute blood loss anemia 09/10/2013  . CAP (community acquired pneumonia) 09/09/2013  . Sepsis 09/09/2013  . PNA (pneumonia) 09/09/2013  . A-fib 09/09/2013  . Unspecified asthma(493.90) 09/09/2013  . Encounter for therapeutic drug monitoring 09/06/2013  . Hx of esophagectomy 08/02/2013  . Anticoagulant long-term use 08/02/2013  . Aspiration pneumonia 07/26/2013  . Tremor 05/29/2013  . Edema 05/14/2013  . Aortic regurgitation 05/14/2013  . Hemoptysis 01/15/2013  . Atrial fibrillation with rapid ventricular response 11/23/2011  . Gastric outlet obstruction 11/05/2011  . Esophageal stricture 10/19/2011  . Asthma with bronchitis 11/24/2010  . HX OF ESOPHAGEAL CANCER 08/29/2008  . HYPERTENSION 06/14/2007  . ANEMIA-IRON DEFICIENCY 03/14/2007  . GERD 03/13/2007    Past Medical History:  Past Medical History  Diagnosis Date  . GERD (gastroesophageal reflux disease)   . Osteoporosis   . Postmenopausal HRT (hormone replacement therapy)   . Anemia   . Hypertension   . Allergy   . Asthma   . Shortness of breath   . PONV (postoperative nausea and vomiting)   . Dysrhythmia     a fib  . Esophageal cancer     removed esophagus stomach pulled up  . Basal cell carcinoma of face     "several; freezes them off"  . Pneumonia     "all the time; probably 5 times in the last year, counting  today" (01/06/2014)  . Chronic bronchitis     "practically q winter" (01/06/2014)  . History of blood transfusion 1993    "related to esophagus removal" (01/06/2014)  . Headache(784.0)     "monthly in the past year" (01/06/2014)  . Arthritis     "legs" (01/06/2014)   Past Surgical History:  Past Surgical History  Procedure Laterality Date  . Left oophorectomy  1952  . Esophageal removal of cancer,gastric ppull-thru 1993    . Forearm fracture surgery Right ?1990's  . Esophagogastroduodenoscopy  10/19/2011    Procedure: ESOPHAGOGASTRODUODENOSCOPY (EGD);  Surgeon: Milus Banister, MD;  Location: [facility];  Service: Endoscopy;  Laterality: N/A;  . Balloon dilation  10/19/2011    Procedure: BALLOON DILATION;  Surgeon: Milus Banister, MD;  Location: [facility];  Service: Endoscopy;  Laterality: N/A;  . Esophagogastroduodenoscopy  11/07/2011    Procedure: ESOPHAGOGASTRODUODENOSCOPY (EGD);  Surgeon: Ladene Artist, MD,FACG;  Location: [facility] ENDOSCOPY;  Service: Endoscopy;  Laterality: N/A;  . Esophagogastroduodenoscopy  11/22/2011    Procedure: ESOPHAGOGASTRODUODENOSCOPY (EGD);  Surgeon: Inda Castle, MD;  Location: [facility];  Service: Endoscopy;  Laterality: N/A;  . Esophagogastroduodenoscopy  11/24/2011    Procedure: ESOPHAGOGASTRODUODENOSCOPY (EGD);  Surgeon: Inda Castle, MD;  Location: [facility];  Service: Endoscopy;  Laterality: N/A;  with removable duodenal stent (actually using esophageal partially covered 23X15 located in locked supply room  . Duodenal stent placement  11/24/2011    Procedure: DUODENAL STENT PLACEMENT;  Surgeon: Inda Castle, MD;  Location: [facility];  Service: Endoscopy;  Laterality:  N/A;  . Video bronchoscopy Bilateral 01/18/2013    Procedure: VIDEO BRONCHOSCOPY WITHOUT FLUORO;  Surgeon: Tanda Rockers, MD;  Location: [facility];  Service: Cardiopulmonary;  Laterality: Bilateral;  . Esophagogastroduodenoscopy N/A 02/11/2013    Procedure:  ESOPHAGOGASTRODUODENOSCOPY (EGD);  Surgeon: Inda Castle, MD;  Location: [facility];  Service: Endoscopy;  Laterality: N/A;  . Duodenal stent placement N/A 02/11/2013    Procedure: DUODENAL STENT PLACEMENT;  Surgeon: Inda Castle, MD;  Location: [facility];  Service: Endoscopy;  Laterality: N/A;  . Esophagogastroduodenoscopy N/A 09/11/2013    Procedure: ESOPHAGOGASTRODUODENOSCOPY (EGD);  Surgeon: Gatha Mayer, MD;  Location: [facility];  Service: Endoscopy;  Laterality: N/A;  . Cholecystectomy  ~ 1996  . Dilation and curettage of uterus  X 6-7  . Cataract extraction w/ intraocular lens  implant, bilateral Bilateral ~ 2000    Assessment & Plan Clinical Impression: [name] is a 80 y.o. female with history of A fib, HTN, esophageal cancer s/p esophagectomy with gastric pull thorough, severe GERD, gastroparesis, recurrent aspiration PNA for past 6 months; who was found by her neighbor in her garden tub on 01/06/14. She reported having tripped and fallen in the tub about 12 hours prior to being found. Family reported recent cold type symptoms for the past few weeks with decrease in po intake as well as recent d/c of coumadin due to hematemesis. CT head without acute changes. CT cervical spine with mild spinal stenosis with cord flattening and dilated esophagus. She was started on IV antibiotics for LLL aspiration PNA and therapies ordered. Digoxin discontinued as family question A fib history. She develop A Fib with RVR 150-170 requiring IV metoprolol and amiodarone. 2D echo with EF 60-65% and Patient with gait disorder and had been referred to outpatient PT but had to stop that due to medical issues. She continues to have episodic hemoptysis and is a fall risk therefore coumadin and ASA discontinued with recommendations to follow up with cardiology on outpatient basis for input on plavix. Urine Culture with 75,000 E coli and sputum culture with few E coli. Blood cultures pending. PICC  placed for IV antibiotic. Therapies initiated and limtied by tachycardia. CIR recommended by MD and rehab team and patient admitted today. Patient is full code. Wants everything done possible but does not want to be "kept alive if there's no hope" Chronic tremor in the right upper extremity, was evaluated by neurology. Does not have Parkinson's disease. Tremor most likely secondary to Reglan. Also has had neck tilting towards the left side since her surgery for the esophageal cancer.  Patient transferred to CIR on 01/09/2014 .   Patient currently requires min with mobility secondary to muscle weakness and muscle joint tightness, decreased cardiorespiratoy endurance and decreased standing balance, decreased postural control and decreased balance strategies.  Prior to hospitalization, patient was independent  with mobility and lived with Alone in a House home.  Home access is 5Stairs to enter.  Patient will benefit from skilled PT intervention to maximize safe functional mobility, minimize fall risk and decrease caregiver burden for planned discharge home with intermittent assist.  Anticipate patient will benefit from follow up [facility] at discharge.  PT - End of Session Activity Tolerance: Tolerates 30+ min activity with multiple rests Endurance Deficit: Yes PT Assessment Rehab Potential: Good PT Patient demonstrates impairments in the following area(s): Edema;Other (comment);Balance;Skin Integrity;Endurance;Pain;Safety (strength) PT Transfers Functional Problem(s): Bed Mobility;Bed to Chair;Car;Furniture;Floor PT Locomotion Functional Problem(s): Ambulation;Stairs PT Plan PT Intensity: Minimum of 1-2 x/day ,  45 to 90 minutes PT Frequency: 5 out of 7 days PT Duration Estimated Length of Stay: 7-10days PT Treatment/Interventions: Ambulation/gait training;Community reintegration;DME/adaptive equipment instruction;Neuromuscular re-education;Psychosocial support;Stair training;UE/LE Strength taining/ROM;UE/LE  Coordination activities;Therapeutic Activities;Skin care/wound management;Pain management;Discharge planning;Balance/vestibular training;Disease management/prevention;Functional mobility training;Patient/family education;Therapeutic Exercise PT Transfers Anticipated Outcome(s): Mod(I)-Supervision PT Locomotion Anticipated Outcome(s): Mod(I)-Supervision PT Recommendation Follow Up Recommendations: Home health PT Patient destination: Home Equipment Recommended: To be determined  Skilled Therapeutic Intervention 1:1. Pt received sitting in recliner, ready for therapy. PT evaluation performed, see detailed objective information below. Pt verbalized understanding of education regarding rehab environment, role of therapies and general safety plan. Tx initiated w/ emphasis on standing balance, ambulation w/ RW and overall safety during functional mobility. Berg Balance Test performed, pt scored 36/56 and educated on results. Emphasis on emergent awareness during balance as well as need for RW for overall safety. Pt demonstrating good safety awareness stating, "I'll probably have to use the walker when I go home." Pt sitting in recliner at end of session w/ all needs in reach.   PT Evaluation Precautions/Restrictions Precautions Precautions: Fall Restrictions Weight Bearing Restrictions: No General   Vital Signs  Pain Pain Assessment Pain Assessment: No/denies pain Pain Score: 3  Pain Type: Acute pain Pain Location: Arm Pain Orientation: Right Pain Descriptors / Indicators: Discomfort Pain Intervention(s): RN made aware Home Living/Prior Functioning Home Living Available Help at Discharge: Available PRN/intermittently;Neighbor;Family Type of Home: House Home Access: Stairs to enter [employer] of Steps: 5 Entrance Stairs-Rails: Right Home Layout: One level  Lives With: Alone Prior Function Level of Independence: Independent with basic ADLs;Independent with homemaking with  ambulation;Independent with gait;Independent with transfers  Able to Take Stairs?: Yes Driving: Yes 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 Pt drives. Pt also reports that she had assistance at home 2x/week for 4 hours until recently "She didn't do, much, I can do everything myself, I didn't need her any more." Had not been using walker.; Hobbies: church, choir, watching sports Vision/Perception     Cognition Overall Cognitive Status: Within Functional Limits for tasks assessed Arousal/Alertness: Awake/alert Orientation Level: Oriented X4 Attention: Selective Selective Attention: Appears intact Memory: Appears intact Awareness: Appears intact Problem Solving: Appears intact Safety/Judgment: Appears intact Sensation Sensation Light Touch: Appears Intact Proprioception: Appears Intact Coordination Gross Motor Movements are Fluid and Coordinated: Yes Fine Motor Movements are Fluid and Coordinated: Yes Motor  Motor Motor: Within Functional Limits;Other (comment) Motor - Skilled Clinical Observations: Generalized Weakness  Mobility Bed Mobility Bed Mobility: Supine to Sit;Sit to Supine Supine to Sit: 5: Supervision;HOB elevated;With rails Supine to Sit Details: Verbal cues for precautions/safety Sit to Supine: 5: Supervision;HOB elevated;With rail Sit to Supine - Details: Verbal cues for precautions/safety Transfers Transfers: Yes Sit to Stand: 4: Min guard Stand to Sit: 4: Min guard Stand Pivot Transfers: 4: Min Psychologist, occupational Details: Verbal cues for safe use of DME/AE Locomotion  Ambulation Ambulation: Yes Ambulation/Gait Assistance: 4: Min assist Ambulation Distance (Feet): 150 Feet Assistive device: Rolling walker Ambulation/Gait Assistance Details: Verbal cues for precautions/safety;Verbal cues for safe use of DME/AE Gait Gait: Yes Gait Pattern: Impaired Gait Pattern:  Trunk flexed;Lateral trunk lean to left;Decreased hip/knee flexion - right;Decreased hip/knee flexion - left;Decreased dorsiflexion - left;Decreased dorsiflexion - right;Narrow base of support Gait velocity: slightly increased Stairs / Additional Locomotion Stairs: Yes Stairs Assistance: 4: Min guard Stair Management Technique: One rail Left;Alternating pattern;Step to pattern;Forwards Number of Stairs: 5 Wheelchair Mobility Wheelchair Mobility: No (Pt  at ambulatory level)  Trunk/Postural Assessment  Cervical Assessment Cervical Assessment: Exceptions to [facility] Cervical AROM Overall Cervical AROM Comments: B rotation WFL; Side flexion limited to R  Cervical Strength Overall Cervical Strength Comments: Pt presents w/ L side flexion, R rotation. "I can lift my head up, but its just so weak that I let it go to that side." Thoracic Assessment Thoracic Assessment: Exceptions to [facility] (kyphotic) Lumbar Assessment Lumbar Assessment: Within Functional Limits Postural Control Postural Control: Deficits on evaluation Head Control: decreased; presents w/ L side flexion and R rotation after bout of pneumonia 78mago.  Righting Reactions: delayed; noted intermittent L Lean and LOB to poster/L directions  Balance Balance Balance Assessed: Yes Standardized Balance Assessment Standardized Balance Assessment: Berg Balance Test Berg Balance Test Sit to Stand: Able to stand without using hands and stabilize independently Standing Unsupported: Able to stand 2 minutes with supervision Sitting with Back Unsupported but Feet Supported on Floor or Stool: Able to sit safely and securely 2 minutes Stand to Sit: Sits safely with minimal use of hands Transfers: Able to transfer safely, minor use of hands Standing Unsupported with Eyes Closed: Able to stand 10 seconds with supervision Standing Ubsupported with Feet Together: Able to place feet together independently and stand for 1 minute with supervision From  Standing, Reach Forward with Outstretched Arm: Reaches forward but needs supervision From Standing Position, Pick up Object from Floor: Able to pick up shoe, needs supervision From Standing Position, Turn to Look Behind Over each Shoulder: Looks behind one side only/other side shows less weight shift Turn 360 Degrees: Needs close supervision or verbal cueing Standing Unsupported, Alternately Place Feet on Step/Stool: Needs assistance to keep from falling or unable to try Standing Unsupported, One Foot in Front: Able to take small step independently and hold 30 seconds Standing on One Leg: Tries to lift leg/unable to hold 3 seconds but remains standing independently Total Score: 36 Static Sitting Balance Static Sitting - Balance Support: Feet supported;No upper extremity supported Static Sitting - Level of Assistance: 7: Independent Dynamic Sitting Balance Dynamic Sitting - Balance Support: Feet supported;Right upper extremity supported;Left upper extremity supported;Bilateral upper extremity supported;Feet unsupported Dynamic Sitting - Level of Assistance: 6: Modified independent (Device/Increase time);5: Stand by assistance Dynamic Sitting - Balance Activities: Lateral lean/weight shifting;Forward lean/weight shifting;Reaching for objects Static Standing Balance Static Standing - Balance Support: No upper extremity supported;Bilateral upper extremity supported Static Standing - Level of Assistance: 5: Stand by assistance Dynamic Standing Balance Dynamic Standing - Balance Support: Bilateral upper extremity supported;Right upper extremity supported;Left upper extremity supported Dynamic Standing - Level of Assistance: 4: Min assist;5: Stand by assistance Dynamic Standing - Balance Activities: Forward lean/weight shifting;Lateral lean/weight shifting;Reaching for objects Extremity Assessment  RUE Assessment RUE Assessment: Within Functional Limits LUE Assessment LUE Assessment: Within  Functional Limits RLE Assessment RLE Assessment: Exceptions to [facility] Strength RLE Overall Strength Comments: Grossly 4-/5 LLE Assessment LLE Assessment: Exceptions to [facility] Strength LLE Overall Strength Comments: Grossly 4-/5  FIM:  FIM - Bed/Chair Transfer Bed/Chair Transfer: 4: Bed > Chair or W/C: Min A (steadying Pt. > 75%)   Refer to Care Plan for Long Term Goals  Recommendations for other services: None  Discharge Criteria: Patient will be discharged from PT if patient refuses treatment 3 consecutive times without medical reason, if treatment goals not met, if there is a change in medical status, if patient makes no progress towards goals or if patient is discharged from hospital.  The above assessment, treatment  plan, treatment alternatives and goals were discussed and mutually agreed upon: by patient  Gilmore Laroche 01/10/2014, 4:12 PM

## 2014-01-10 NOTE — Progress Notes (Signed)
INITIAL NUTRITION ASSESSMENT  DOCUMENTATION CODES Per approved criteria  -Not Applicable   INTERVENTION: Resource Breeze po BID, each supplement provides 250 kcal and 9 grams of protein  NUTRITION DIAGNOSIS: Inadequate oral intake related to recurrent aspiration pneumonia as evidenced by reported intake less than estimated needs; improving   Goal: Pt to meet >/= 90% of their estimated nutrition needs   Monitor:  Wt, po intake, acceptance of supplements  Reason for Assessment: MST  80 y.o. female  Admitting Dx: <principal problem not specified>  ASSESSMENT: 80 y.o. female with history of A fib, HTN, esophageal cancer s/p esophagectomy with gastric pull thorough, severe GERD, gastroparesis, recurrent aspiration PNA for past 6 months; who was found by her neighbor in her garden tub on 01/06/14. She reported having tripped and fallen in the tub about 12 hours prior to being found. Family reported recent cold type symptoms for the past few weeks with decrease in po intake as well as recent d/c of coumadin due to hematemesis.  - Family reports that spicy and acidic foods cause her to aspirate and cause pain in throat. - Pt reports that she has been eating well and that her appetite is improved. - Pt does not like Ensure because she says that it is "too thick." - Usual body weight is around 114 lbs, but that it fluctuates when she is retaining fluid. She reports no recent weight loss.  Height: Ht Readings from Last 1 Encounters:  01/09/14 5' (1.524 m)    Weight: Wt Readings from Last 1 Encounters:  01/09/14 123 lb 10.9 oz (56.1 kg)    Ideal Body Weight: 45.5 kg  % Ideal Body Weight: 123%  Wt Readings from Last 10 Encounters:  01/09/14 123 lb 10.9 oz (56.1 kg)  01/06/14 115 lb 11.9 oz (52.5 kg)  12/20/13 114 lb (51.71 kg)  12/10/13 114 lb (51.71 kg)  12/02/13 112 lb (50.803 kg)  11/12/13 116 lb (52.617 kg)  11/11/13 115 lb (52.164 kg)  10/28/13 115 lb (52.164 kg)   10/22/13 114 lb (51.71 kg)  10/21/13 114 lb (51.71 kg)    Usual Body Weight: ~114 lbs  % Usual Body Weight: 107%  BMI:  Body mass index is 24.15 kg/(m^2).  Estimated Nutritional Needs: Kcal: 1400-1600 Protein: 65-75 g Fluid: 1.4-1.6 L/day  Skin: stage I pressure ulcer on buttocks, incision on arm and wrist  Diet Order: General  EDUCATION NEEDS: -Education needs addressed   Intake/Output Summary (Last 24 hours) at 01/10/14 1106 Last data filed at 01/10/14 0800  Gross per 24 hour  Intake    240 ml  Output      0 ml  Net    240 ml    Last BM: 6/11   Labs:   Recent Labs Lab 01/07/14 1144 01/08/14 0912 01/09/14 0915 01/10/14 0500  NA 136* 141 143 141  K 3.5* 3.5* 3.7 3.6*  CL 100 106 107 102  CO2 25 23 26 28   BUN 12 6 5* 3*  CREATININE 0.65 0.63 0.61 0.61  CALCIUM 7.9* 7.8* 8.6 8.4  MG 1.5 2.0  --   --   GLUCOSE 140* 164* 102* 86    CBG (last 3)   Recent Labs  01/09/14 1600 01/09/14 2237 01/10/14 0746  GLUCAP 94 110* 90    Scheduled Meds: . acetaminophen  650 mg Oral TID  . amiodarone  200 mg Oral BID  . [START ON 01/23/2014] amiodarone  200 mg Oral Daily  . arformoterol  15 mcg  Nebulization BID  . budesonide  0.25 mg Nebulization BID  . cefTRIAXone (ROCEPHIN)  IV  1 g Intravenous Q24H  . digoxin  0.125 mg Oral Daily  . feeding supplement (ENSURE COMPLETE)  237 mL Oral Q lunch  . furosemide  40 mg Oral Daily  . metoCLOPramide  5 mg Oral BID  . olopatadine  1 drop Both Eyes BID  . pantoprazole  40 mg Oral BID  . potassium chloride  10 mEq Oral BID  . sucralfate  1 g Oral BID  . [START ON 01/14/2014] Vitamin D (Ergocalciferol)  50,000 Units Oral Q7 days    Continuous Infusions:   Past Medical History  Diagnosis Date  . GERD (gastroesophageal reflux disease)   . Osteoporosis   . Postmenopausal HRT (hormone replacement therapy)   . Anemia   . Hypertension   . Allergy   . Asthma   . Shortness of breath   . PONV (postoperative nausea  and vomiting)   . Dysrhythmia     a fib  . Esophageal cancer     removed esophagus stomach pulled up  . Basal cell carcinoma of face     "several; freezes them off"  . Pneumonia     "all the time; probably 5 times in the last year, counting today" (01/06/2014)  . Chronic bronchitis     "practically q winter" (01/06/2014)  . History of blood transfusion 1993    "related to esophagus removal" (01/06/2014)  . Headache(784.0)     "monthly in the past year" (01/06/2014)  . Arthritis     "legs" (01/06/2014)    Past Surgical History  Procedure Laterality Date  . Left oophorectomy  1952  . Esophageal removal of cancer,gastric ppull-thru 1993    . Forearm fracture surgery Right ?1990's  . Esophagogastroduodenoscopy  10/19/2011    Procedure: ESOPHAGOGASTRODUODENOSCOPY (EGD);  Surgeon: Milus Banister, MD;  Location: [facility];  Service: Endoscopy;  Laterality: N/A;  . Balloon dilation  10/19/2011    Procedure: BALLOON DILATION;  Surgeon: Milus Banister, MD;  Location: [facility];  Service: Endoscopy;  Laterality: N/A;  . Esophagogastroduodenoscopy  11/07/2011    Procedure: ESOPHAGOGASTRODUODENOSCOPY (EGD);  Surgeon: Ladene Artist, MD,FACG;  Location: [facility] ENDOSCOPY;  Service: Endoscopy;  Laterality: N/A;  . Esophagogastroduodenoscopy  11/22/2011    Procedure: ESOPHAGOGASTRODUODENOSCOPY (EGD);  Surgeon: Inda Castle, MD;  Location: [facility];  Service: Endoscopy;  Laterality: N/A;  . Esophagogastroduodenoscopy  11/24/2011    Procedure: ESOPHAGOGASTRODUODENOSCOPY (EGD);  Surgeon: Inda Castle, MD;  Location: [facility];  Service: Endoscopy;  Laterality: N/A;  with removable duodenal stent (actually using esophageal partially covered 23X15 located in locked supply room  . Duodenal stent placement  11/24/2011    Procedure: DUODENAL STENT PLACEMENT;  Surgeon: Inda Castle, MD;  Location: [facility];  Service: Endoscopy;  Laterality: N/A;  . Video bronchoscopy Bilateral 01/18/2013     Procedure: VIDEO BRONCHOSCOPY WITHOUT FLUORO;  Surgeon: Tanda Rockers, MD;  Location: [facility];  Service: Cardiopulmonary;  Laterality: Bilateral;  . Esophagogastroduodenoscopy N/A 02/11/2013    Procedure: ESOPHAGOGASTRODUODENOSCOPY (EGD);  Surgeon: Inda Castle, MD;  Location: [facility];  Service: Endoscopy;  Laterality: N/A;  . Duodenal stent placement N/A 02/11/2013    Procedure: DUODENAL STENT PLACEMENT;  Surgeon: Inda Castle, MD;  Location: [facility];  Service: Endoscopy;  Laterality: N/A;  . Esophagogastroduodenoscopy N/A 09/11/2013    Procedure: ESOPHAGOGASTRODUODENOSCOPY (EGD);  Surgeon: Gatha Mayer, MD;  Location: [facility];  Service:  Endoscopy;  Laterality: N/A;  . Cholecystectomy  ~ 1996  . Dilation and curettage of uterus  X 6-7  . Cataract extraction w/ intraocular lens  implant, bilateral Bilateral ~ [place] RD, LDN

## 2014-01-10 NOTE — Progress Notes (Signed)
Physical Therapy Session Note  Patient Details  Name: [name] MRN: [id number] Date of Birth: 06-Mar-1929  Today's Date: 01/10/2014 Time: 6834-1962 Time Calculation (min): 35 min  Short Term Goals: Week 1:  PT Short Term Goal 1 (Week 1): STGs=LTGs due to anticipated LOS  Skilled Therapeutic Interventions/Progress Updates:  1:1. Pt received sitting in recliner, ready for therapy. Focus this session on gait training and head/neck AROM and stretching.   Pt amb 175'x2 w/ RW and close(S)-min guard A. Emphasis during ambulation on decreased pace, erect posture and midline head positioning. Therapist providing tactile cue to pt's R ear to maintain contact with during ambulation for improved midline head positioning, req cues approx every 10' for realignment.   Pt with good tolerance to semi-reclined position on therapy mat with use of wedge to perform chin tucks, scap retraction, AROM B side flexion and rotation with end hold. Pt w/ good tolerance overall.  Pt with episode of urinary urgency at end of session, but continent. Pt req min A for management of clothing due to edema making clothes tight.   Pt sitting in recliner and in care of PA-C at end of session to address wounds/bandages.    Therapy Documentation Precautions:  Precautions Precautions: Fall Restrictions Weight Bearing Restrictions: No General: Amount of Missed PT Time (min): 10 Minutes Missed Time Reason: Nursing care  See FIM for current functional status  Therapy/Group: Individual Therapy  Gilmore Laroche 01/10/2014, 5:02 PM

## 2014-01-10 NOTE — Progress Notes (Signed)
80 y.o. female with history of A fib, HTN, esophageal cancer s/p esophagectomy with gastric pull thorough, severe GERD, gastroparesis, recurrent aspiration PNA for past 6 months; who was found by her neighbor in her garden tub on 01/06/14. She reported having tripped and fallen in the tub about 12 hours prior to being found. Family reported recent cold type symptoms for the past few weeks with decrease in po intake as well as recent d/c of coumadin due to hematemesis. CT head without acute changes. CT cervical spine with mild spinal stenosis with cord flattening and dilated esophagus. She was started on IV antibiotics for LLL aspiration PNA and therapies ordered. Digoxin discontinued as family question A fib history. She develop A Fib with RVR 150-170 requiring IV metoprolol and amiodarone. 2D echo with EF 60-65%   Subjective/Complaints: Slept poorly last night, usually uses Ambien. Also was coughing last night. Had both Mucinex as well as hydrocodone cough syrup ordered. She was given than Mucinex and stated he did not help. Was upset that she did not received the hydrocodone cough syrup  Daughter at bedside concerned about both of these issues.  We discussed the patient's use of Ambien. Her fall occurred before she took her nightly Ambien. She has not had any falls in the middle of the night where she wakes after taking Ambien  Having diarrhea but no abdominal pain Objective: Vital Signs: Blood pressure 110/74, pulse 77, temperature 98.6 F (37 C), temperature source Oral, resp. rate 18, height 5' (1.524 m), weight 56.1 kg (123 lb 10.9 oz), SpO2 94.00%. Dg Chest Port 1 View  01/08/2014   CLINICAL DATA:  PICC placement.  Recurrent atrial fibrillation.  EXAM: PORTABLE CHEST - 1 VIEW  COMPARISON:  01/06/2014  FINDINGS: PICC has been inserted and the tip is at the level of the carina in the superior vena cava in good position.  There is persistent prominence of the cardiomediastinal silhouette. The  patient has a known gastric pull-through with gastroesophageal stent due to esophageal cancer.  The area of infiltrate/atelectasis at the left lung base has slightly increased and could represent pneumonia or aspiration pneumonitis.  The pulmonary vascularity is normal. Right lung is clear. No acute osseous abnormalities.  IMPRESSION: 1. PICC is in good position with the tip at the level of the carina. 2. Slight increased atelectasis/infiltrate at the left lung base as described above.   Electronically Signed   By: Rozetta Nunnery M.D.   On: 01/08/2014 15:19   Results for orders placed during the hospital encounter of 01/09/14 (from the past 72 hour(s))  GLUCOSE, CAPILLARY     Status: Abnormal   Collection Time    01/09/14 10:37 PM      Result Value Ref Range   Glucose-Capillary 110 (*) 70 - 99 mg/dL  CBC WITH DIFFERENTIAL     Status: Abnormal   Collection Time    01/10/14  5:00 AM      Result Value Ref Range   WBC 7.2  4.0 - 10.5 K/uL   RBC 4.28  3.87 - 5.11 MIL/uL   Hemoglobin 10.4 (*) 12.0 - 15.0 g/dL   HCT 33.5 (*) 36.0 - 46.0 %   MCV 78.3  78.0 - 100.0 fL   MCH 24.3 (*) 26.0 - 34.0 pg   MCHC 31.0  30.0 - 36.0 g/dL   RDW 15.8 (*) 11.5 - 15.5 %   Platelets 383  150 - 400 K/uL   Neutrophils Relative % 60  43 - 77 %  Neutro Abs 4.4  1.7 - 7.7 K/uL   Lymphocytes Relative 26  12 - 46 %   Lymphs Abs 1.9  0.7 - 4.0 K/uL   Monocytes Relative 12  3 - 12 %   Monocytes Absolute 0.8  0.1 - 1.0 K/uL   Eosinophils Relative 2  0 - 5 %   Eosinophils Absolute 0.2  0.0 - 0.7 K/uL   Basophils Relative 0  0 - 1 %   Basophils Absolute 0.0  0.0 - 0.1 K/uL  COMPREHENSIVE METABOLIC PANEL     Status: Abnormal   Collection Time    01/10/14  5:00 AM      Result Value Ref Range   Sodium 141  137 - 147 mEq/L   Potassium 3.6 (*) 3.7 - 5.3 mEq/L   Chloride 102  96 - 112 mEq/L   CO2 28  19 - 32 mEq/L   Glucose, Bld 86  70 - 99 mg/dL   BUN 3 (*) 6 - 23 mg/dL   Creatinine, Ser 0.61  0.50 - 1.10 mg/dL    Calcium 8.4  8.4 - 10.5 mg/dL   Total Protein 4.6 (*) 6.0 - 8.3 g/dL   Albumin 2.0 (*) 3.5 - 5.2 g/dL   AST 17  0 - 37 U/L   ALT 31  0 - 35 U/L   Alkaline Phosphatase 71  39 - 117 U/L   Total Bilirubin 0.3  0.3 - 1.2 mg/dL   GFR calc non Af Amer 82 (*) >90 mL/min   GFR calc Af Amer >90  >90 mL/min   Comment: (NOTE)     The eGFR has been calculated using the CKD EPI equation.     This calculation has not been validated in all clinical situations.     eGFR's persistently <90 mL/min signify possible Chronic Kidney     Disease.  GLUCOSE, CAPILLARY     Status: None   Collection Time    01/10/14  7:46 AM      Result Value Ref Range   Glucose-Capillary 90  70 - 99 mg/dL   Comment 1 Notify RN        Head: Normocephalic and atraumatic.  Eyes: Conjunctivae are normal. Pupils are equal, round, and reactive to light.  Cardiovascular: An irregularly irregular rhythm present.  Respiratory: Effort normal. No respiratory distress. She has decreased breath sounds in the left lower field. She has rhonchi in the right upper field, the left upper and lower field.  Has paroxsymal episodic productive coughing spells during conversation.  GI: Soft. Bowel sounds are normal. She exhibits no distension. There is no tenderness.  Musculoskeletal: She exhibits edema (2+ pitting edema BLE tibially).  Resting tremor RUE (tardive dyskinesia)  Neurological: She is alert and oriented to person, place, and time.  Pleasant and appropriate. Follows commands without difficulty.  Skin: Skin is warm and dry.  Large bruise upper mid thoracic spine.  Psychiatric: She has a normal mood and affect. Her behavior is normal. Thought content normal.  Laterocollis to Left  Motor strength is 4/5 bilateral deltoid, bicep, tricep, grip, hip flexor, knee extensor, ankle dorsiflexor and plantar flexor   Assessment/Plan: 1. Functional deficits secondary to deconditioning after pneumonia, and multiple medical issues. Recent fall  with trunk,soft tissue trauma  which require 3+ hours per day of interdisciplinary therapy in a comprehensive inpatient rehab setting. Physiatrist is providing close team supervision and 24 hour management of active medical problems listed below. Physiatrist and rehab team continue to assess  barriers to discharge/monitor patient progress toward functional and medical goals. FIM: FIM - Bathing Bathing Steps Patient Completed: Chest;Right Arm;Left Arm;Abdomen;Front perineal area;Right upper leg;Left upper leg;Right lower leg (including foot);Left lower leg (including foot) Bathing: 4: Min-Patient completes 8-9 36f 10 parts or 75+ percent  FIM - Upper Body Dressing/Undressing Upper body dressing/undressing steps patient completed: Thread/unthread right sleeve of pullover shirt/dresss;Thread/unthread left sleeve of pullover shirt/dress;Put head through opening of pull over shirt/dress;Pull shirt over trunk Upper body dressing/undressing: 5: Set-up assist to: Obtain clothing/put away FIM - Lower Body Dressing/Undressing Lower body dressing/undressing steps patient completed: Thread/unthread right underwear leg;Thread/unthread left underwear leg;Thread/unthread right pants leg;Thread/unthread left pants leg Lower body dressing/undressing: 2: Max-Patient completed 25-49% of tasks  FIM - Toileting Toileting steps completed by patient: Adjust clothing after toileting;Performs perineal hygiene Toileting: 3: Mod-Patient completed 2 of 3 steps  FIM - Air cabin crew Transfers: 4-To toilet/BSC: Min A (steadying Pt. > 75%);4-From toilet/BSC: Min A (steadying Pt. > 75%)  FIM - Bed/Chair Transfer Bed/Chair Transfer: 4: Bed > Chair or W/C: Min A (steadying Pt. > 75%)     Comprehension Comprehension Mode: Auditory Comprehension: 5-Follows basic conversation/direction: With no assist  Expression Expression Mode: Verbal Expression: 5-Expresses basic 90% of the time/requires cueing < 10% of the  time.  Social Interaction Social Interaction: 4-Interacts appropriately 75 - 89% of the time - Needs redirection for appropriate language or to initiate interaction.  Problem Solving Problem Solving: 2-Solves basic 25 - 49% of the time - needs direction more than half the time to initiate, plan or complete simple activities  Memory Memory: 3-Recognizes or recalls 50 - 74% of the time/requires cueing 25 - 49% of the time  Medical Problem List and Plan:  1. Functional deficits secondary to deconditioning after pneumonia, and multiple medical issues. Recent fall with trunk,soft tissue trauma  2. DVT Prophylaxis/Anticoagulation: Mechanical: Antiembolism stockings, knee (TED hose) Bilateral lower extremities  Sequential compression devices, below knee Bilateral lower extremities  3. Pain Management: Will continue tylenol tid schedule with tramadol prn for moderate to severe pain.  4. Mood: Has a positive outlook. Will have LCSW follow for evaluation and support.  5. Neuropsych: This patient is capable of making decisions on her own behalf.  6. Recurrent Aspiration PNA/E coli UTI: Narrow antibiotics to rocephin as sensitivities available. Will have speech evaluate swallow to decide on appropriate diet. Check follow up CXR in am.  7. Leucocytosis: will recheck in am.  8. A fib:Now on amiodarone as well as digoxin for rate control.  9. Chronic cough: will schedule Tessalon perles. Use tussionex bid prn severe cough.  10 Esophageal cancer s/p resection with severe GERD/duodenal stent: Continue home regimen of PPI bid, Carafate and Reglan.  11. Diarrhea: Likely antibiotic associated. Will order probiotic. Check c diff.  12. Chronic bronchitis with asthma: Continue nebs bid. Will continue Mucinex DM as well as IS while awake.  13.  Mild hypoK likely related to loose stools , supplement  LOS (Days) 1 A FACE TO FACE EVALUATION WAS PERFORMED  KIRSTEINS,ANDREW E 01/10/2014, 11:12 AM

## 2014-01-10 NOTE — Progress Notes (Signed)
PMR Admission Coordinator Pre-Admission Assessment  Patient: [name] is an 80 y.o., female  MRN: [id number]  DOB: 1928-11-14  Height: 5' (152.4 cm)  Weight: 52.5 kg (115 lb 11.9 oz)  Insurance Information  HMO: No PPO: PCP: IPA: 80/20: OTHER:  PRIMARY: Medicare A/B Policy#: [id number] Subscriber: [name]  CM Name: Phone#: Fax#:  Pre-Cert#: Employer: Retired  Benefits: Phone #: Name: Checked in [place]. Date: 04/01/96 Deduct: $1260 Out of Pocket Max: none Life Max: unlimited  CIR: 100% SNF: 100 days  Outpatient: 80% Co-Pay: 20%  Home Health: 100% Co-Pay: none  DME: 80% Co-Pay: 20%  Providers: patient's choice   SECONDARYAurea Graff Policy#: [id number] Subscriber: [place] Name: Lorel Monaco Phone#: Fax#:  Pre-Cert#: Employer: Retired  Benefits: Phone #: Name:  Irene Shipper. Date: Deduct: Out of Pocket Max: Life Max:  CIR: SNF:  Outpatient: Co-Pay:  Home Health: Co-Pay:  DME: Co-Pay:  Emergency Contact Information  Contact Information    Name  Relation  Home  Work  Mobile    Ritter,Judy  Niece  [phone]   [phone]    Theodosia Paling  Niece  [phone]   [phone]      Current Medical History  Patient Admitting Diagnosis: Deconditioning after pneumonia, and multiple medical issues. Recent fall with trunk,soft tissue trauma  History of Present Illness: An 80 y.o. female with history of A fib, HTN, esophageal cancer s/p esophagectomy with gastric pull thorough, severe GERD, gastroparesis, recurrent aspiration PNA for past 6 months; who was found by her neighbor in her garden tub on 01/06/14. She reported having tripped and fallen in the tub about 12 hours prior to being found. Family reported recent cold type symptoms for the past few weeks with decrease in po intake as well as recent d/c of coumadin due to hematemesis. CT head without acute changes. CT cervical spine with mild spinal stenosis with cord flattening and dilated esophagus. She was started on IV  antibiotics for LLL aspiration PNA and therapies ordered. Digoxin discontinued as family question A fib history. She develop A Fib with RVR 150-170 requiring IV metoprolol. Patient with gait disorder and had been referred to outpatient PT but had to stop that due to medical issues. OT evaluation done and CIR recommended due to balance deficits.  Past Medical History  Past Medical History   Diagnosis  Date   .  GERD (gastroesophageal reflux disease)    .  Osteoporosis    .  Postmenopausal HRT (hormone replacement therapy)    .  Anemia    .  Hypertension    .  Allergy    .  Asthma    .  Shortness of breath    .  PONV (postoperative nausea and vomiting)    .  Dysrhythmia      a fib   .  Esophageal cancer      removed esophagus stomach pulled up   .  Basal cell carcinoma of face      "several; freezes them off"   .  Pneumonia      "all the time; probably 5 times in the last year, counting today" (01/06/2014)   .  Chronic bronchitis      "practically q winter" (01/06/2014)   .  History of blood transfusion  1993     "related to esophagus removal" (01/06/2014)   .  Headache(784.0)      "monthly in the past year" (01/06/2014)   .  Arthritis      "legs" (  01/06/2014)    Family History  family history includes Asthma in her brother and sister; Cervical cancer in her mother; Colon polyps in her sister; Coronary artery disease in her brother and sister; Heart disease in her father; Hypotension in her sister; Pancreatic cancer in an other family member. There is no history of Colon cancer.  Prior Rehab/Hospitalizations: Went to [facility] for outpatient therapy X 2 sessions. Has in [facility] in [place] X 2 for 3-4 weeks each stay.  Current Medications  Current facility-administered medications:acetaminophen (TYLENOL) tablet 650 mg, 650 mg, Oral, TID, Delfina Redwood, MD, 650 mg at 01/08/14 2201; amiodarone (PACERONE) tablet 200 mg, 200 mg, Oral, BID, Delfina Redwood, MD, 200 mg at 01/09/14  1015; Ampicillin-Sulbactam (UNASYN) 3 g in sodium chloride 0.9 % 100 mL IVPB, 3 g, Intravenous, Q8H, Delfina Redwood, MD, 3 g at 01/09/14 0342  arformoterol (BROVANA) nebulizer solution 15 mcg, 15 mcg, Nebulization, BID, Nita Sells, MD, 15 mcg at 01/09/14 0900; budesonide (PULMICORT) nebulizer solution 0.25 mg, 0.25 mg, Nebulization, BID, Nita Sells, MD, 0.25 mg at 01/09/14 0900; chlorpheniramine-HYDROcodone (TUSSIONEX) 10-8 MG/5ML suspension 5 mL, 5 mL, Oral, Q12H PRN, Nita Sells, MD, 5 mL at 01/09/14 1116  dextromethorphan-guaiFENesin ([place] DM) 30-600 MG per 12 hr tablet 1 tablet, 1 tablet, Oral, BID, Nita Sells, MD, 1 tablet at 01/09/14 1015; digoxin (LANOXIN) tablet 0.125 mg, 0.125 mg, Oral, Daily, Delfina Redwood, MD, 0.125 mg at 01/09/14 1015; feeding supplement (ENSURE COMPLETE) (ENSURE COMPLETE) liquid 237 mL, 237 mL, Oral, Q lunch, Rogue Bussing, RD  furosemide (LASIX) tablet 40 mg, 40 mg, Oral, Daily, Nita Sells, MD, 40 mg at 01/09/14 1015; levalbuterol (XOPENEX) nebulizer solution 0.63 mg, 0.63 mg, Nebulization, Q2H PRN, Delfina Redwood, MD; Levofloxacin (LEVAQUIN) IVPB 250 mg, 250 mg, Intravenous, Q24H, Anh P Pham, RPH, 250 mg at 01/08/14 1542; levofloxacin (LEVAQUIN) IVPB 750 mg, 750 mg, Intravenous, Once, Leota Jacobsen, MD  metoCLOPramide (REGLAN) tablet 5 mg, 5 mg, Oral, BID, Nita Sells, MD, 5 mg at 01/09/14 1015; metoprolol (LOPRESSOR) injection 2.5 mg, 2.5 mg, Intravenous, Q6H PRN, Delfina Redwood, MD; olopatadine (PATANOL) 0.1 % ophthalmic solution 1 drop, 1 drop, Both Eyes, BID, Nita Sells, MD, 1 drop at 01/09/14 1015; pantoprazole (PROTONIX) EC tablet 40 mg, 40 mg, Oral, BID, Nita Sells, MD, 40 mg at 01/09/14 5638  potassium chloride 10 mEq in 100 mL IVPB, 10 mEq, Intravenous, Q1 Hr x 2, Delfina Redwood, MD; promethazine (PHENERGAN) suppository 25 mg, 25 mg, Rectal, Q6H PRN, Nita Sells, MD; sodium chloride 0.9 % injection 10-40 mL, 10-40 mL, Intracatheter, PRN, Delfina Redwood, MD, 10 mL at 01/09/14 0914; sucralfate (CARAFATE) 1 GM/10ML suspension 1 g, 1 g, Oral, BID, Nita Sells, MD, 1 g at 01/09/14 7564  traMADol (ULTRAM) tablet 50 mg, 50 mg, Oral, Q8H PRN, Nita Sells, MD, 50 mg at 01/08/14 3329; Vitamin D (Ergocalciferol) (DRISDOL) capsule 50,000 Units, 50,000 Units, Oral, Q7 days, Nita Sells, MD, 50,000 Units at 01/07/14 1007; zolpidem (AMBIEN) tablet 5 mg, 5 mg, Oral, QHS, Nita Sells, MD, 5 mg at 01/08/14 2201  Patients Current Diet: General  Precautions / Restrictions  Precautions  Precautions: Fall  Restrictions  Weight Bearing Restrictions: No  Prior Activity Level  Community (5-7x/wk): Went out daily. Drove short distances to post office, Tax adviser, etc.  Development worker, international aid / [place] Devices/Equipment: Eyeglasses;Nebulizer;Walker (specify type);Cane (specify quad or straight);Other (Comment);Grab bars in shower;Hand-held shower hose;Built-in shower seat (raised toilet;  recliner)  Home Equipment: Walker - 2 wheels;Shower seat - built in;Grab bars - toilet;Grab bars - tub/shower;Hand held shower head  Prior Functional Level  Prior Function  Level of Independence: Independent with assistive device(s)  Comments: sleeps in recliner at home. Pt drives. Pt also reports that she had assistance at home 2x/week for 4 hours until recently "She didn't do, much, I can do everything myself, I didn't need her any more." Had not been using walker.  Current Functional Level  Cognition  Overall Cognitive Status: Within Functional Limits for tasks assessed  Orientation Level: Oriented X4   Extremity Assessment  (includes Sensation/Coordination)  Upper Extremity Assessment: Defer to OT evaluation  Lower Extremity Assessment: Generalized weakness   ADLs  Overall ADL's : Needs assistance/impaired  Grooming: Wash/dry  hands;Wash/dry face;Set up;Sitting  Upper Body Bathing: Set up;Sitting  Lower Body Bathing: Moderate assistance;Sit to/from stand  Lower Body Dressing: Set up;Moderate assistance;Sit to/from stand  Toilet Transfer: Moderate assistance;BSC;Stand-pivot;Ambulation;Cueing for safety;Cueing for sequencing  Toilet Transfer Details (indicate cue type and reason): Pt transferred to 3:1 x2 during this therapy session, pt demonstrates posterior & left lateral lean with sit to stand noted and requires physical assist to correct.  Toileting- Clothing Manipulation and Hygiene: Minimal assistance;Sitting/lateral lean  Functional mobility during ADLs: Minimal assistance;Moderate assistance;Cueing for safety;Cueing for sequencing  General ADL Comments: Pt was educated in role of OT and participated in ADL retraining session after assessment today. Pt appears to fatigue from coughing during minimal activity. Pt currently requires physical assist for all ADL's and transfers, rec CIR consult/rehab secondary to lives alone & was independent PTA. Pt was sitting up in chair, w/ call bell/phone in reach, eating breakfast and RN in room at conclusion of session (Pt was educated in role of OT and participated in ADL retraining session after assessment today. Pt appears to fatigue from coughing during minimal activity. Pt currently requires physical assist for all ADL's and transfers, rec CIR consult/rehab seconda)   Mobility  Overal bed mobility: Needs Assistance  Bed Mobility: Supine to Sit;Sit to Supine  Supine to sit: Min assist  Sit to supine: Min assist  General bed mobility comments: Assist to bring feet up onto bed.   Transfers  Overall transfer level: Needs assistance  Equipment used: Rolling walker (2 wheeled)  Transfers: Sit to/from Stand  Sit to Stand: Min assist  Stand pivot transfers: Min assist  General transfer comment: Verbal cues for hand placement and assist for balance.   Ambulation / Gait / Stairs /  Wheelchair Mobility  Ambulation/Gait  Ambulation/Gait assistance: Fish farm manager (Feet): 150 Feet  Assistive device: Rolling walker (2 wheeled)  Gait Pattern/deviations: Step-through pattern;Narrow base of support  General Gait Details: Pt with occasional posterior stagger.   Posture / Balance  Overall balance assessment: Needs assistance  Sitting-balance support: No upper extremity supported;Feet supported  Sitting balance-Leahy Scale: Fair   Special needs/care consideration  BiPAP/CPAP No  CPM No  Continuous Drip IV KVO for IV antibiotics  Dialysis No  Life Vest No  Oxygen No  Special Bed No  Trach Size No  Wound Vac (area) No  Skin Has scrapes on L wrist and elbow with dressings. Back is bruised and sore. Weakness right knee since the fall at home  Bowel mgmt: Loose stools today, 01/09/13  Bladder mgmt: Urgency incontinence at times. Using BSC.  Diabetic mgmt No   Previous Home Environment  Living Arrangements: Alone  Available Help at Discharge: Available PRN/intermittently;Other (Comment);Neighbor;Family  Type of Home: House  Home Layout: One level  Home Access: Stairs to enter  Entrance Stairs-Rails: Right  Entrance Stairs-Number of Steps: 5  Bathroom Shower/Tub: Tourist information centre manager: Handicapped height  Bathroom Accessibility: Yes  How Accessible: Accessible via walker  Chase: No  Discharge Living Setting  Plans for Discharge Living Setting: Patient's home;Alone;House  Type of Home at Discharge: House  Discharge Home Layout: One level  Discharge Home Access: Stairs to enter  Entrance Stairs-Rails: Right  Entrance Stairs-Number of Steps: 5 steps at garage entrance  Does the patient have any problems obtaining your medications?: No  Social/Family/Support Systems  Patient Roles: Other (Comment) (Has a niece and a great niece.)  Contact Information: [name] - niece and Theodosia Paling - great niece  Anticipated Caregiver:  self  Anticipated Caregiver's Contact Information: See emergency contacts  Ability/Limitations of Caregiver: Niece checks on patient daily, calls daily  Caregiver Availability: Other (Comment) (Family calls to check on patient.)  Discharge Plan Discussed with Primary Caregiver: Yes  Is Caregiver In Agreement with Plan?: Yes  Does Caregiver/Family have Issues with Lodging/Transportation while Pt is in Rehab?: No  Goals/Additional Needs  Patient/Family Goal for Rehab: PT/OT mod I/Supervision goals. Patient hopes to discharge home at mod I level.  Expected length of stay: 8-12 days  Cultural Considerations: [place]. Attends the [employer] in [place]  Dietary Needs: Regular diet, thin liquids  Equipment Needs: TBD  Pt/Family Agrees to Admission and willing to participate: Yes  Program Orientation Provided & Reviewed with Pt/Caregiver Including Roles & Responsibilities: Yes  Decrease burden of Care through IP rehab admission: N/A  Possible need for SNF placement upon discharge: Not anticipated. However, if patient is not safe to go home at mod I level, may need to consider SNF.  Patient Condition: This patient's condition remains as documented in the consult dated 01/07/14, in which the Rehabilitation Physician determined and documented that the patient's condition is appropriate for intensive rehabilitative care in an inpatient rehabilitation facility. Will admit to inpatient rehab today.  Preadmission Screen Completed By: Retta Diones, 01/09/2014 12:00 PM  ______________________________________________________________________  Discussed status with Dr. Letta Pate on 01/09/14 at 1215 and received telephone approval for admission today.  Admission Coordinator: Retta Diones, time1215/Date06/11/15  Cosigned by: [name], MD [01/09/2014 12:20 PM]

## 2014-01-10 NOTE — Progress Notes (Signed)
Physical Medicine and Rehabilitation Consult  Reason for Consult: Balance disorder  Referring Physician: Dr. Conley Canal.  HPI: [name] is a 80 y.o. female with history of A fib, HTN, esophageal cancer s/p esophagectomy with gastric pull thorough, severe GERD, gastroparesis, recurrent aspiration PNA for past 6 months; who was found by her neighbor in her garden tub on 01/06/14. She reported having tripped and fallen in the tub about 12 hours prior to being found. Family reported recent cold type symptoms for the past few weeks with decrease in po intake as well as recent d/c of coumadin due to hematemesis. CT head without acute changes. CT cervical spine with mild spinal stenosis with cord flattening and dilated esophagus. She was started on IV antibiotics for LLL aspiration PNA and therapies ordered. Digoxin discontinued as family question A fib history. She develop A Fib with RVR 150-170 requiring IV metoprolol. Patient with gait disorder and had been referred to outpatient PT but had to stop that due to medical issues. OT evaluation done and CIR recommended due to balance deficits.  Review of Systems  HENT: Negative for ear pain and hearing loss.  Respiratory: Positive for cough (chronic --uses nebs twice a day) and hemoptysis (last pm and today past lunch.). Negative for shortness of breath.  Cardiovascular: Negative for chest pain and palpitations.  Gastrointestinal: Positive for heartburn, nausea and vomiting. Negative for abdominal pain.  Genitourinary: Negative for urgency and frequency.  Musculoskeletal: Positive for back pain, myalgias and neck pain.  Neurological: Positive for dizziness (just recently) and headaches.  Psychiatric/Behavioral: The patient has insomnia.   Past Medical History   Diagnosis  Date   .  GERD (gastroesophageal reflux disease)    .  Osteoporosis    .  Postmenopausal HRT (hormone replacement therapy)    .  Anemia    .  Hypertension    .  Allergy    .   Asthma    .  Shortness of breath    .  PONV (postoperative nausea and vomiting)    .  Dysrhythmia      a fib   .  Esophageal cancer      removed esophagus stomach pulled up   .  Basal cell carcinoma of face      "several; freezes them off"   .  Pneumonia      "all the time; probably 5 times in the last year, counting today" (01/06/2014)   .  Chronic bronchitis      "practically q winter" (01/06/2014)   .  History of blood transfusion  1993     "related to esophagus removal" (01/06/2014)   .  Headache(784.0)      "monthly in the past year" (01/06/2014)   .  Arthritis      "legs" (01/06/2014)    Past Surgical History   Procedure  Laterality  Date   .  Left oophorectomy   1952   .  Esophageal removal of cancer,gastric ppull-thru 1993     .  Forearm fracture surgery  Right  ?1990's   .  Esophagogastroduodenoscopy   10/19/2011     Procedure: ESOPHAGOGASTRODUODENOSCOPY (EGD); Surgeon: Milus Banister, MD; Location: [facility]; Service: Endoscopy; Laterality: N/A;   .  Balloon dilation   10/19/2011     Procedure: BALLOON DILATION; Surgeon: Milus Banister, MD; Location: [facility]; Service: Endoscopy; Laterality: N/A;   .  Esophagogastroduodenoscopy   11/07/2011     Procedure: ESOPHAGOGASTRODUODENOSCOPY (EGD); Surgeon: Ladene Artist,  MD,FACG; Location: [facility]; Service: Endoscopy; Laterality: N/A;   .  Esophagogastroduodenoscopy   11/22/2011     Procedure: ESOPHAGOGASTRODUODENOSCOPY (EGD); Surgeon: Inda Castle, MD; Location: [facility]; Service: Endoscopy; Laterality: N/A;   .  Esophagogastroduodenoscopy   11/24/2011     Procedure: ESOPHAGOGASTRODUODENOSCOPY (EGD); Surgeon: Inda Castle, MD; Location: [facility]; Service: Endoscopy; Laterality: N/A; with removable duodenal stent (actually using esophageal partially covered 23X15 located in locked supply room   .  Duodenal stent placement   11/24/2011     Procedure: DUODENAL STENT PLACEMENT; Surgeon: Inda Castle, MD; Location: [facility]; Service: Endoscopy; Laterality: N/A;   .  Video bronchoscopy  Bilateral  01/18/2013     Procedure: VIDEO BRONCHOSCOPY WITHOUT FLUORO; Surgeon: Tanda Rockers, MD; Location: [facility]; Service: Cardiopulmonary; Laterality: Bilateral;   .  Esophagogastroduodenoscopy  N/A  02/11/2013     Procedure: ESOPHAGOGASTRODUODENOSCOPY (EGD); Surgeon: Inda Castle, MD; Location: [facility]; Service: Endoscopy; Laterality: N/A;   .  Duodenal stent placement  N/A  02/11/2013     Procedure: DUODENAL STENT PLACEMENT; Surgeon: Inda Castle, MD; Location: [facility]; Service: Endoscopy; Laterality: N/A;   .  Esophagogastroduodenoscopy  N/A  09/11/2013     Procedure: ESOPHAGOGASTRODUODENOSCOPY (EGD); Surgeon: Gatha Mayer, MD; Location: [facility]; Service: Endoscopy; Laterality: N/A;   .  Cholecystectomy   ~ 1996   .  Dilation and curettage of uterus   X 6-7   .  Cataract extraction w/ intraocular lens implant, bilateral  Bilateral  ~ 2000    Family History   Problem  Relation  Age of Onset   .  Cervical cancer  Mother    .  Heart disease  Father      MI   .  Coronary artery disease  Brother    .  Hypotension  Sister    .  Colon cancer  Neg Hx    .  Colon polyps  Sister    .  Pancreatic cancer       1/2 sister   .  Coronary artery disease  Sister      pacemaker   .  Asthma  Brother    .  Asthma  Sister     Social History: Divorced. 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 Has two nieces who check in occasionally. She reports that she has never smoked. She has never used smokeless tobacco. She reports that she does not drink alcohol or use illicit drugs.  Allergies   Allergen  Reactions   .  Erythromycin Ethylsuccinate      Irregular pulse rate   .  Prednisone      "makes me hyper"   .  Doxycycline  Rash    Medications Prior to Admission   Medication  Sig  Dispense  Refill   .  acetaminophen (TYLENOL) 500 MG tablet  Take 1 tablet (500 mg total)  by mouth every 6 (six) hours as needed for headache.  30 tablet    .  albuterol (PROVENTIL HFA;VENTOLIN HFA) 108 (90 BASE) MCG/ACT inhaler  Inhale 2 puffs into the lungs every 6 (six) hours as needed. For wheezing  1 Inhaler  4   .  albuterol (PROVENTIL) (2.5 MG/3ML) 0.083% nebulizer solution  Take 3 mLs (2.5 mg total) by nebulization every 4 (four) hours as needed for wheezing or shortness of breath.  75 mL  12   .  arformoterol (BROVANA) 15 MCG/2ML NEBU  Take 2 mLs (15 mcg total) by nebulization 2 (two) times daily. Dx: 493.00  120 mL    .  budesonide (PULMICORT) 0.25 MG/2ML nebulizer solution  Take 2 mLs (0.25 mg total) by nebulization 2 (two) times daily. Dx: 493.00  60 mL    .  chlorpheniramine-HYDROcodone (TUSSIONEX PENNKINETIC ER) 10-8 MG/5ML LQCR  Take 5 mLs by mouth every 12 (twelve) hours as needed for cough.  480 mL  0   .  dextromethorphan-guaiFENesin (MUCINEX DM) 30-600 MG per 12 hr tablet  Take 0.5 tablets by mouth 2 (two) times daily.     .  digoxin (LANOXIN) 0.125 MG tablet  Take 1 tablet (0.125 mg total) by mouth daily.  90 tablet  3   .  Fe Fum-FA-B Cmp-C-Zn-Mg-Mn-Cu (HEMOCYTE PLUS) 106-1 MG CAPS  Take 1 tablet by mouth 2 (two) times daily.     .  furosemide (LASIX) 40 MG tablet  Take 1 tablet (40 mg total) by mouth daily.  30 tablet  0   .  lansoprazole (PREVACID SOLUTAB) 30 MG disintegrating tablet  Take 2 tablets (60 mg total) by mouth 2 (two) times daily.  360 tablet  3   .  Methylcobalamin (B-12) 5000 MCG TBDP  Take 1 tablet by mouth daily.     .  metoCLOPramide (REGLAN) 5 MG tablet  Take 1 tablet (5 mg total) by mouth 2 (two) times daily.  180 tablet  3   .  Multiple Vitamins-Calcium (VIACTIV MULTI-VITAMIN) CHEW  Chew 1 each by mouth daily.   0   .  Olopatadine HCl (PATADAY) 0.2 % SOLN  Apply 1-2 drops to eye 2 (two) times daily as needed (dry, itchy eyes.).     Marland Kitchen  promethazine (PHENERGAN) 25 MG suppository  Place 25 mg rectally every 6 (six) hours as needed for nausea or  vomiting.     .  sucralfate (CARAFATE) 1 GM/10ML suspension  Take 1 g by mouth 2 (two) times daily.     .  traMADol (ULTRAM) 50 MG tablet  Take 1 tablet (50 mg total) by mouth every 8 (eight) hours as needed for moderate pain.  30 tablet  0   .  Vitamin D, Ergocalciferol, (DRISDOL) 50000 UNITS CAPS capsule  Take 1 capsule (50,000 Units total) by mouth every 7 (seven) days. On Friday  30 capsule  3   .  zolpidem (AMBIEN) 5 MG tablet  Take 5 mg by mouth at bedtime.      Home:  Home Living  Family/patient expects to be discharged to:: Private residence  Living Arrangements: Alone  Available Help at Discharge: Available PRN/intermittently;Other (Comment) (Neighbor/family)  Type of Home: House  Home Access: Stairs to enter  [employer] of Steps: 5 STE  Entrance Stairs-Rails: Right  Home Layout: One level  Home Equipment: Walker - 2 wheels;Shower seat - built in;Grab bars - toilet;Grab bars - tub/shower;Hand held shower head  Functional History:  Prior Function  Level of Independence: Independent with assistive device(s)  Comments: sleeps in recliner at home. Pt drives. Pt also reports that she had assistance at home 2x/week for 4 hours until recently "She didn't do, much, I can do everything myself, I didn't need her any more."  Functional Status:  Mobility:  Bed Mobility  Overal bed mobility: Needs Assistance  Bed Mobility: Supine to Sit  Supine to sit: Min assist  General bed mobility comments: Min A and vc's for safety  Transfers  Overall transfer level: Needs assistance  Equipment used: 1 person hand held assist  Transfers: Sit to/from [facility]  Sit to Stand: Mod assist  Stand pivot transfers: Min assist  General transfer comment: (Pt with posterior lean with initial stand from 3:1, EOB and recliner noted.)    ADL:  ADL  Overall ADL's : Needs assistance/impaired  Grooming: Wash/dry hands;Wash/dry face;Set up;Sitting  Upper Body Bathing: Set  up;Sitting  Lower Body Bathing: Moderate assistance;Sit to/from stand  Lower Body Dressing: Set up;Moderate assistance;Sit to/from stand  Toilet Transfer: Moderate assistance;BSC;Stand-pivot;Ambulation;Cueing for safety;Cueing for sequencing  Toilet Transfer Details (indicate cue type and reason): Pt transferred to 3:1 x2 during this therapy session, pt demonstrates posterior & left lateral lean with sit to stand noted and requires physical assist to correct.  Toileting- Clothing Manipulation and Hygiene: Minimal assistance;Sitting/lateral lean  Functional mobility during ADLs: Minimal assistance;Moderate assistance;Cueing for safety;Cueing for sequencing  General ADL Comments: Pt was educated in role of OT and participated in ADL retraining session after assessment today. Pt appears to fatigue from coughing during minimal activity. Pt currently requires physical assist for all ADL's and transfers, rec CIR consult/rehab secondary to lives alone & was independent PTA. Pt was sitting up in chair, w/ call bell/phone in reach, eating breakfast and RN in room at conclusion of session (Pt was educated in role of OT and participated in ADL retraining session after assessment today. Pt appears to fatigue from coughing during minimal activity. Pt currently requires physical assist for all ADL's and transfers, rec CIR consult/rehab seconda)  Cognition:  Cognition  Overall Cognitive Status: Within Functional Limits for tasks assessed  Orientation Level: Oriented X4  Cognition  Arousal/Alertness: Awake/alert  Behavior During Therapy: WFL for tasks assessed/performed  Overall Cognitive Status: Within Functional Limits for tasks assessed  Blood pressure 121/78, pulse 47, temperature 98.2 F (36.8 C), temperature source Oral, resp. rate 19, height 5' (1.524 m), weight 52.5 kg (115 lb 11.9 oz), SpO2 92.00%.  Physical Exam  Nursing note and vitals reviewed.  Constitutional: She is oriented to person, place, and  time. She appears well-developed.  HENT:  Head: Normocephalic and atraumatic.  Eyes: Conjunctivae are normal. Pupils are equal, round, and reactive to light.  Neck:  Decreased ROM noted.  Cardiovascular: Normal rate and regular rhythm.  Respiratory: Effort normal. No respiratory distress. She has decreased breath sounds. She has no wheezes.  Intermittent congested cough during exam.  GI: Soft. Bowel sounds are normal. She exhibits no distension. There is no tenderness.  Neurological: She is alert and oriented to person, place, and time. No cranial nerve deficit. She exhibits normal muscle tone. Coordination normal.  Moves all 4's. Has general weakness. Proximal greater than distal. No gross sensory abnl.  Skin: Skin is warm and dry.  Psychiatric: She has a normal mood and affect. Her behavior is normal. Thought content normal.   Results for orders placed during the hospital encounter of 01/06/14 (from the past 24 hour(s))   CBC WITH DIFFERENTIAL Status: Abnormal    Collection Time    01/06/14 2:02 PM   Result  Value  Ref Range    WBC  22.4 (*)  4.0 - 10.5 K/uL    RBC  5.32 (*)  3.87 - 5.11 MIL/uL    Hemoglobin  13.4  12.0 - 15.0 g/dL    HCT  41.5  36.0 - 46.0 %    MCV  78.0  78.0 - 100.0 fL    MCH  25.2 (*)  26.0 - 34.0 pg  MCHC  32.3  30.0 - 36.0 g/dL    RDW  15.4  11.5 - 15.5 %    Platelets  463 (*)  150 - 400 K/uL    Neutrophils Relative %  89 (*)  43 - 77 %    Neutro Abs  20.0 (*)  1.7 - 7.7 K/uL    Lymphocytes Relative  4 (*)  12 - 46 %    Lymphs Abs  0.9  0.7 - 4.0 K/uL    Monocytes Relative  7  3 - 12 %    Monocytes Absolute  1.5 (*)  0.1 - 1.0 K/uL    Eosinophils Relative  0  0 - 5 %    Eosinophils Absolute  0.0  0.0 - 0.7 K/uL    Basophils Relative  0  0 - 1 %    Basophils Absolute  0.0  0.0 - 0.1 K/uL   COMPREHENSIVE METABOLIC PANEL Status: Abnormal    Collection Time    01/06/14 2:02 PM   Result  Value  Ref Range    Sodium  138  137 - 147 mEq/L    Potassium   3.9  3.7 - 5.3 mEq/L    Chloride  98  96 - 112 mEq/L    CO2  25  19 - 32 mEq/L    Glucose, Bld  143 (*)  70 - 99 mg/dL    BUN  13  6 - 23 mg/dL    Creatinine, Ser  0.53  0.50 - 1.10 mg/dL    Calcium  9.1  8.4 - 10.5 mg/dL    Total Protein  5.9 (*)  6.0 - 8.3 g/dL    Albumin  2.6 (*)  3.5 - 5.2 g/dL    AST  41 (*)  0 - 37 U/L    ALT  28  0 - 35 U/L    Alkaline Phosphatase  105  39 - 117 U/L    Total Bilirubin  0.9  0.3 - 1.2 mg/dL    GFR calc non Af Amer  86 (*)  >90 mL/min    GFR calc Af Amer  >90  >90 mL/min   CK Status: Abnormal    Collection Time    01/06/14 2:02 PM   Result  Value  Ref Range    Total CK  470 (*)  7 - 177 U/L   TROPONIN I Status: None    Collection Time    01/06/14 2:02 PM   Result  Value  Ref Range    Troponin I  <0.30  <0.30 ng/mL   I-STAT CG4 LACTIC ACID, ED Status: None    Collection Time    01/06/14 2:40 PM   Result  Value  Ref Range    Lactic Acid, Venous  1.26  0.5 - 2.2 mmol/L   LEGIONELLA ANTIGEN, URINE Status: None    Collection Time    01/06/14 8:11 PM   Result  Value  Ref Range    Specimen Description  URINE, CLEAN CATCH     Special Requests  NONE     Legionella Antigen, Urine      Value:  Negative for Legionella pneumophilia serogroup 1     Performed at [facility]    Report Status  01/07/2014 FINAL    STREP PNEUMONIAE URINARY ANTIGEN Status: None    Collection Time    01/06/14 8:11 PM   Result  Value  Ref Range    Strep Pneumo Urinary Antigen  NEGATIVE  NEGATIVE   GLUCOSE, CAPILLARY Status: Abnormal    Collection Time    01/06/14 9:39 PM   Result  Value  Ref Range    Glucose-Capillary  105 (*)  70 - 99 mg/dL    Comment 1  Documented in Chart     Comment 2  Notify RN    GLUCOSE, CAPILLARY Status: Abnormal    Collection Time    01/07/14 6:22 AM   Result  Value  Ref Range    Glucose-Capillary  117 (*)  70 - 99 mg/dL   CULTURE, EXPECTORATED SPUTUM-ASSESSMENT Status: None    Collection Time    01/07/14 10:36 AM   Result   Value  Ref Range    Specimen Description  SPUTUM     Special Requests  NONE     Sputum evaluation      Value:  THIS SPECIMEN IS ACCEPTABLE. RESPIRATORY CULTURE REPORT TO FOLLOW.    Report Status  01/07/2014 FINAL    GLUCOSE, CAPILLARY Status: Abnormal    Collection Time    01/07/14 11:42 AM   Result  Value  Ref Range    Glucose-Capillary  129 (*)  70 - 99 mg/dL   BASIC METABOLIC PANEL Status: Abnormal    Collection Time    01/07/14 11:44 AM   Result  Value  Ref Range    Sodium  136 (*)  137 - 147 mEq/L    Potassium  3.5 (*)  3.7 - 5.3 mEq/L    Chloride  100  96 - 112 mEq/L    CO2  25  19 - 32 mEq/L    Glucose, Bld  140 (*)  70 - 99 mg/dL    BUN  12  6 - 23 mg/dL    Creatinine, Ser  0.65  0.50 - 1.10 mg/dL    Calcium  7.9 (*)  8.4 - 10.5 mg/dL    GFR calc non Af Amer  80 (*)  >90 mL/min    GFR calc Af Amer  >90  >90 mL/min   MAGNESIUM Status: None    Collection Time    01/07/14 11:44 AM   Result  Value  Ref Range    Magnesium  1.5  1.5 - 2.5 mg/dL   CK Status: Abnormal    Collection Time    01/07/14 11:44 AM   Result  Value  Ref Range    Total CK  206 (*)  7 - 177 U/L   CBC WITH DIFFERENTIAL Status: Abnormal    Collection Time    01/07/14 11:44 AM   Result  Value  Ref Range    WBC  15.6 (*)  4.0 - 10.5 K/uL    RBC  4.58  3.87 - 5.11 MIL/uL    Hemoglobin  11.4 (*)  12.0 - 15.0 g/dL    HCT  35.7 (*)  36.0 - 46.0 %    MCV  77.9 (*)  78.0 - 100.0 fL    MCH  24.9 (*)  26.0 - 34.0 pg    MCHC  31.9  30.0 - 36.0 g/dL    RDW  15.4  11.5 - 15.5 %    Platelets  426 (*)  150 - 400 K/uL    Neutrophils Relative %  81 (*)  43 - 77 %    Neutro Abs  12.7 (*)  1.7 - 7.7 K/uL    Lymphocytes Relative  7 (*)  12 - 46 %    Lymphs Abs  1.0  0.7 - 4.0 K/uL    Monocytes Relative  12  3 - 12 %    Monocytes Absolute  1.9 (*)  0.1 - 1.0 K/uL    Eosinophils Relative  0  0 - 5 %    Eosinophils Absolute  0.0  0.0 - 0.7 K/uL    Basophils Relative  0  0 - 1 %    Basophils Absolute  0.0  0.0 -  0.1 K/uL    Dg Chest 2 View  01/06/2014 CLINICAL DATA: Fall with pain in the mid to lower back. EXAM: CHEST 2 VIEW COMPARISON: 12/20/2013. CT chest 09/12/2013. FINDINGS: Surgical clips in the expected location of the thyroid. Trachea is midline. Heart is enlarged. Thoracic aorta is calcified. A long wall stent projects over the lower thoracic spine and traverses a gastric pull-through when compared with 09/12/2013. Patchy left basilar airspace disease. Small left pleural effusion. Osteopenia without definite compression fracture. IMPRESSION: 1. Patchy left basilar airspace disease may be due to pneumonia. 2. Small left pleural effusion. 3. Osteopenia without definite fracture. Electronically Signed By: Lorin Picket M.D. On: 01/06/2014 13:36  Dg Thoracic Spine 2 View  01/06/2014 CLINICAL DATA: Fall. Back pain. EXAM: THORACIC SPINE - 2 VIEW COMPARISON: Lateral chest radiograph 12/20/2013 FINDINGS: No fracture. No spondylolisthesis. There are mild disc degenerative changes along the mid lower thoracic spine. Bones are diffusely demineralized. Vena cava stent extends from the mid chest to the central upper abdomen. IMPRESSION: No fracture or acute finding. Electronically Signed By: Lajean Manes M.D. On: 01/06/2014 13:35  Dg Lumbar Spine Complete  01/06/2014 CLINICAL DATA: 80 year old female status post fall with pain. Initial encounter. History of esophagectomy with gastric pull-through and stenting EXAM: LUMBAR SPINE - COMPLETE 4+ VIEW COMPARISON: Abdominal 11/17/2011. Chest CT 09/12/2013. FINDINGS: Metallic stents in the distal stomach and right upper quadrant Stable from the recent CT. Stable right upper quadrant surgical clips. Multiple surgical clips projecting at the mediastinum and left lung base re- identified. Transitional lumbosacral anatomy, suspect L5 level being mostly sacralized. Anterolisthesis of L4 on L5 measuring up to 9 mm. Moderate to severe lower lumbar facet hypertrophy and degeneration.  Relatively preserved disc spaces. Elsewhere lumbar vertebral height and alignment within normal limits. Grossly intact visualized lower thoracic levels. Sacral ala and SI joints within normal limits. Sclerosis at the pubic symphysis probably is degenerative. IMPRESSION: 1. Transitional lumbosacral anatomy suspected with sacralized L5 level. Grade 1-2 anterolisthesis of L4 on L5 with advanced facet degeneration. 2. No acute osseous abnormality identified in the lumbar spine. 3. Postoperative changes to the mediastinum with gastric pull-through metal stent extending into the right upper quadrant. Electronically Signed By: Lars Pinks M.D. On: 01/06/2014 13:39  Ct Head Wo Contrast  01/06/2014 CLINICAL DATA: Found down. Fell in bathtub. EXAM: CT HEAD WITHOUT CONTRAST CT CERVICAL SPINE WITHOUT CONTRAST TECHNIQUE: Multidetector CT imaging of the head and cervical spine was performed following the standard protocol without intravenous contrast. Multiplanar CT image reconstructions of the cervical spine were also generated. COMPARISON: 01/15/2013 head CT. Scout view of the paranasal sinus CT 01/15/2013. Brain MR 08/17/2004. FINDINGS: CT HEAD FINDINGS No skull fracture or intracranial hemorrhage. Small vessel disease type changes without CT evidence of large acute infarct. Partial opacification right sphenoid sinus air cell and upper aspect of the right pterygoid plate containing hyperdense material which may represent inspissated material. No intracranial mass lesion noted on this unenhanced exam. No hydrocephalus. CT CERVICAL SPINE FINDINGS No cervical spine fracture noted. Minimal anterior slip of C3  and C4 felt to be related to facet joint degenerative changes. Minimal anterior widening of the posterior aspect of the left C5-6 and C6-7 facet joint. This may be normal for this patient rather than related to injury however if there was a high clinical suspicion of ligamentous injury, MR or flexion and extension views could be  obtained for further delineation. C5-6 and C6-7 disc degeneration with broad-based spur causing mild spinal stenosis and cord flattening. No abnormal prevertebral soft tissue swelling. Dilated esophagus. Patient has an esophageal stent in place and this may be related to treatment of tumor although incompletely assessed. IMPRESSION: CT head: No skull fracture or intracranial hemorrhage. Small vessel disease type changes without CT evidence of large acute infarct. Partial opacification right sphenoid sinus air cell and upper aspect of the right pterygoid plate containing hyperdense material which may represent inspissated material. CT cervical spine: No cervical spine fracture noted. Minimal anterior slip of C3 and C4 felt to be related to facet joint degenerative changes. Minimal anterior widening of the posterior aspect of the left C5-6 and C6-7 facet joint. This may be normal for this patient rather than related to injury however if there was a high clinical suspicion of ligamentous injury, MR or flexion and extension views could be obtained for further delineation. C5-6 and C6-7 disc degeneration with broad-based spur causing mild spinal stenosis and cord flattening. No abnormal prevertebral soft tissue swelling. Dilated esophagus. Patient has an esophageal stent in place and this may be related to treatment of tumor although incompletely assessed. Electronically Signed By: Chauncey Cruel M.D. On: 01/06/2014 14:05  Ct Cervical Spine Wo Contrast  01/06/2014 CLINICAL DATA: Found down. Fell in bathtub. EXAM: CT HEAD WITHOUT CONTRAST CT CERVICAL SPINE WITHOUT CONTRAST TECHNIQUE: Multidetector CT imaging of the head and cervical spine was performed following the standard protocol without intravenous contrast. Multiplanar CT image reconstructions of the cervical spine were also generated. COMPARISON: 01/15/2013 head CT. Scout view of the paranasal sinus CT 01/15/2013. Brain MR 08/17/2004. FINDINGS: CT HEAD FINDINGS No  skull fracture or intracranial hemorrhage. Small vessel disease type changes without CT evidence of large acute infarct. Partial opacification right sphenoid sinus air cell and upper aspect of the right pterygoid plate containing hyperdense material which may represent inspissated material. No intracranial mass lesion noted on this unenhanced exam. No hydrocephalus. CT CERVICAL SPINE FINDINGS No cervical spine fracture noted. Minimal anterior slip of C3 and C4 felt to be related to facet joint degenerative changes. Minimal anterior widening of the posterior aspect of the left C5-6 and C6-7 facet joint. This may be normal for this patient rather than related to injury however if there was a high clinical suspicion of ligamentous injury, MR or flexion and extension views could be obtained for further delineation. C5-6 and C6-7 disc degeneration with broad-based spur causing mild spinal stenosis and cord flattening. No abnormal prevertebral soft tissue swelling. Dilated esophagus. Patient has an esophageal stent in place and this may be related to treatment of tumor although incompletely assessed. IMPRESSION: CT head: No skull fracture or intracranial hemorrhage. Small vessel disease type changes without CT evidence of large acute infarct. Partial opacification right sphenoid sinus air cell and upper aspect of the right pterygoid plate containing hyperdense material which may represent inspissated material. CT cervical spine: No cervical spine fracture noted. Minimal anterior slip of C3 and C4 felt to be related to facet joint degenerative changes. Minimal anterior widening of the posterior aspect of the left C5-6 and C6-7 facet joint.  This may be normal for this patient rather than related to injury however if there was a high clinical suspicion of ligamentous injury, MR or flexion and extension views could be obtained for further delineation. C5-6 and C6-7 disc degeneration with broad-based spur causing mild spinal  stenosis and cord flattening. No abnormal prevertebral soft tissue swelling. Dilated esophagus. Patient has an esophageal stent in place and this may be related to treatment of tumor although incompletely assessed. Electronically Signed By: Chauncey Cruel M.D. On: 01/06/2014 14:05   Assessment/Plan:  Diagnosis: deconditioning after pneumonia, and multiple medical issues. Recent fall with trunk,soft tissue trauma  1. Does the need for close, 24 hr/day medical supervision in concert with the patient's rehab needs make it unreasonable for this patient to be served in a less intensive setting? Yes 2. Co-Morbidities requiring supervision/potential complications: afib, chf, htn 3. Due to bladder management, bowel management, safety, skin/wound care, disease management, medication administration, pain management and patient education, does the patient require 24 hr/day rehab nursing? Yes 4. Does the patient require coordinated care of a physician, rehab nurse, PT (1-2 hrs/day, 5 days/week) and OT (1-2 hrs/day, 5 days/week) to address physical and functional deficits in the context of the above medical diagnosis(es)? Yes Addressing deficits in the following areas: balance, endurance, locomotion, strength, transferring, bowel/bladder control, bathing, dressing, feeding, grooming, toileting and psychosocial support 5. Can the patient actively participate in an intensive therapy program of at least 3 hrs of therapy per day at least 5 days per week? Yes 6. The potential for patient to make measurable gains while on inpatient rehab is excellent 7. Anticipated functional outcomes upon discharge from inpatient rehab are modified independent and supervision with PT, modified independent and supervision with OT, n/a with SLP. 8. Estimated rehab length of stay to reach the above functional goals is: 8-12 days 9. Does the patient have adequate social supports to accommodate these discharge functional goals?  Yes 10. Anticipated D/C setting: Home 11. Anticipated post D/C treatments: [place] therapy 12. Overall Rehab/Functional Prognosis: excellent RECOMMENDATIONS:  This patient's condition is appropriate for continued rehabilitative care in the following setting: CIR  Patient has agreed to participate in recommended program. Yes  Note that insurance prior authorization may be required for reimbursement for recommended care.  Comment: Pt has family and hired help who can potentially assist. Rehab Admissions Coordinator to follow up.  Thanks,  Meredith Staggers, MD, Mellody Drown  01/07/2014  Revision History...      Date/Time User Action    01/07/2014 3:59 PM Meredith Staggers, MD Sign    01/07/2014 1:13 PM Bary Leriche, PA-C Share   View Details Report    Routing History.Marland KitchenMarland Kitchen

## 2014-01-10 NOTE — Interval H&P Note (Signed)
[name] was admitted today to Inpatient Rehabilitation with the diagnosis of deconditioning.  The patient's history has been reviewed, patient examined, and there is no change in status.  Patient continues to be appropriate for intensive inpatient rehabilitation.  I have reviewed the patient's chart and labs.  Questions were answered to the patient's satisfaction.  [name] 01/10/2014, 6:27 AM

## 2014-01-10 NOTE — Progress Notes (Signed)
Radiology contacted by RN to get patient's X-ray between 11:30 and 1230 after SLP eval.

## 2014-01-11 ENCOUNTER — Inpatient Hospital Stay (HOSPITAL_COMMUNITY): Payer: Medicare Other | Admitting: *Deleted

## 2014-01-11 LAB — GLUCOSE, CAPILLARY
GLUCOSE-CAPILLARY: 108 mg/dL — AB (ref 70–99)
Glucose-Capillary: 111 mg/dL — ABNORMAL HIGH (ref 70–99)
Glucose-Capillary: 116 mg/dL — ABNORMAL HIGH (ref 70–99)

## 2014-01-11 NOTE — IPOC Note (Signed)
Overall Plan of Care [facility]) Patient Details Name: [name] MRN: [id number] DOB: Mar 30, 1929  Admitting Diagnosis: Deconditioned   Hospital Problems: Active Problems:   Multifactorial gait disorder     Functional Problem List: Nursing Bladder;Bowel;Edema;Medication Management;Motor;Skin Integrity;Safety  PT Edema;Other (comment);Balance;Skin Integrity;Endurance;Pain;Safety (strength)  OT Balance;Edema;Endurance;Motor;Pain;Safety;Skin Integrity  SLP Nutrition  TR         Basic ADL's: OT Grooming;Bathing;Dressing;Toileting     Advanced  ADL's: OT Simple Meal Preparation     Transfers: PT Bed Mobility;Bed to Chair;Car;Furniture;Floor  OT Toilet;Tub/Shower     Locomotion: PT Ambulation;Stairs     Additional Impairments: OT None  SLP Swallowing      TR      Anticipated Outcomes Item Anticipated Outcome  Self Feeding    Swallowing  mod I with least restrictive diet    Basic self-care  mod I   Toileting  mod I    Bathroom Transfers mod I   Bowel/Bladder  Mod I  Transfers  Mod(I)-Supervision  Locomotion  Mod(I)-Supervision  Communication     Cognition     Pain   2 or less on a 0-10 scale  Safety/Judgment  Min A   Therapy Plan: PT Intensity: Minimum of 1-2 x/day ,45 to 90 minutes PT Frequency: 5 out of 7 days PT Duration Estimated Length of Stay: 7-10days OT Intensity: Minimum of 1-2 x/day, 45 to 90 minutes OT Frequency: 5 out of 7 days OT Duration/Estimated Length of Stay: 7-10 days SLP Intensity: Minumum of 1-2 x/day, 30 to 90 minutes SLP Frequency: 3 out of 7 days SLP Duration/Estimated Length of Stay: 7-10 days        Team Interventions: Nursing Interventions Patient/Family Education;Bladder Management;Bowel Management;Medication Management;Disease Management/Prevention;Skin Care/Wound Management;Psychosocial Support;Discharge Planning  PT interventions Ambulation/gait training;Community reintegration;DME/adaptive equipment  instruction;Neuromuscular re-education;Psychosocial support;Stair training;UE/LE Strength taining/ROM;UE/LE Coordination activities;Therapeutic Activities;Skin care/wound management;Pain management;Discharge planning;Balance/vestibular training;Disease management/prevention;Functional mobility training;Patient/family education;Therapeutic Exercise  OT Interventions Balance/vestibular training;Community reintegration;Discharge planning;Pain management;Skin care/wound managment;Visual/perceptual remediation/compensation;UE/LE Coordination activities;UE/LE Strength taining/ROM;Self Care/advanced ADL retraining;Functional mobility training;Psychosocial support;Patient/family education;Therapeutic Activities;DME/adaptive equipment instruction  SLP Interventions Dysphagia/aspiration precaution training  TR Interventions    SW/CM Interventions      Team Discharge Planning: Destination: PT-Home ,OT- Home , SLP-Home Projected Follow-up: PT-Home health PT, OT-  None, SLP-None Projected Equipment Needs: PT-To be determined, OT- To be determined, SLP-  Equipment Details: PT- , OT-  Patient/family involved in discharge planning: PT- Patient,  OT-Patient, SLP-Patient  MD ELOS: 7-10 days Medical Rehab Prognosis:  Excellent Assessment: The patient has been admitted for CIR therapies with the diagnosis of deconditioning after multiple medical. The team will be addressing functional mobility, strength, stamina, balance, safety, adaptive techniques and equipment, self-care, bowel and bladder mgt, patient and caregiver education, community re-integration, egosupport. Goals have been set at mod I for self-care and mod I to supervision for mobility.    Meredith Staggers, MD, FAAPMR      See Team Conference Notes for weekly updates to the plan of care

## 2014-01-11 NOTE — Progress Notes (Signed)
80 y.o. female with history of A fib, HTN, esophageal cancer s/p esophagectomy with gastric pull thorough, severe GERD, gastroparesis, recurrent aspiration PNA for past 6 months; who was found by her neighbor in her garden tub on 01/06/14. She reported having tripped and fallen in the tub about 12 hours prior to being found. Family reported recent cold type symptoms for the past few weeks with decrease in po intake as well as recent d/c of coumadin due to hematemesis. CT head without acute changes. CT cervical spine with mild spinal stenosis with cord flattening and dilated esophagus. She was started on IV antibiotics for LLL aspiration PNA and therapies ordered. Digoxin discontinued as family question A fib history. She develop A Fib with RVR 150-170 requiring IV metoprolol and amiodarone. 2D echo with EF 60-65%   Subjective/Complaints: Slept better with Ambien   Having diarrhea but no abdominal pain Objective: Vital Signs: Blood pressure 137/72, pulse 74, temperature 98 F (36.7 C), temperature source Oral, resp. rate 18, height 5' (1.524 m), weight 56.1 kg (123 lb 10.9 oz), SpO2 95.00%. Dg Chest 2 View  01/10/2014   CLINICAL DATA:  Pneumonia.  EXAM: CHEST  2 VIEW  COMPARISON:  01/08/2014  FINDINGS: The heart is enlarged but stable. Stable surgical changes with an esophageal stent in place. The right PICC line is stable. There is persistent left lower lobe opacity most of which is account for by the gastric pull-through and adjacent atelectasis. A small effusion is also probable.  IMPRESSION: Stable surgical changes related to prior gastric pull-through and esophageal stent. There is persistent atelectasis around the stomach and a probable small left pleural effusion.   Electronically Signed   By: Kalman Jewels M.D.   On: 01/10/2014 13:31   Results for orders placed during the hospital encounter of 01/09/14 (from the past 72 hour(s))  GLUCOSE, CAPILLARY     Status: Abnormal   Collection Time     01/09/14 10:37 PM      Result Value Ref Range   Glucose-Capillary 110 (*) 70 - 99 mg/dL  CBC WITH DIFFERENTIAL     Status: Abnormal   Collection Time    01/10/14  5:00 AM      Result Value Ref Range   WBC 7.2  4.0 - 10.5 K/uL   RBC 4.28  3.87 - 5.11 MIL/uL   Hemoglobin 10.4 (*) 12.0 - 15.0 g/dL   HCT 33.5 (*) 36.0 - 46.0 %   MCV 78.3  78.0 - 100.0 fL   MCH 24.3 (*) 26.0 - 34.0 pg   MCHC 31.0  30.0 - 36.0 g/dL   RDW 15.8 (*) 11.5 - 15.5 %   Platelets 383  150 - 400 K/uL   Neutrophils Relative % 60  43 - 77 %   Neutro Abs 4.4  1.7 - 7.7 K/uL   Lymphocytes Relative 26  12 - 46 %   Lymphs Abs 1.9  0.7 - 4.0 K/uL   Monocytes Relative 12  3 - 12 %   Monocytes Absolute 0.8  0.1 - 1.0 K/uL   Eosinophils Relative 2  0 - 5 %   Eosinophils Absolute 0.2  0.0 - 0.7 K/uL   Basophils Relative 0  0 - 1 %   Basophils Absolute 0.0  0.0 - 0.1 K/uL  COMPREHENSIVE METABOLIC PANEL     Status: Abnormal   Collection Time    01/10/14  5:00 AM      Result Value Ref Range   Sodium 141  137 - 147 mEq/L   Potassium 3.6 (*) 3.7 - 5.3 mEq/L   Chloride 102  96 - 112 mEq/L   CO2 28  19 - 32 mEq/L   Glucose, Bld 86  70 - 99 mg/dL   BUN 3 (*) 6 - 23 mg/dL   Creatinine, Ser 0.61  0.50 - 1.10 mg/dL   Calcium 8.4  8.4 - 10.5 mg/dL   Total Protein 4.6 (*) 6.0 - 8.3 g/dL   Albumin 2.0 (*) 3.5 - 5.2 g/dL   AST 17  0 - 37 U/L   ALT 31  0 - 35 U/L   Alkaline Phosphatase 71  39 - 117 U/L   Total Bilirubin 0.3  0.3 - 1.2 mg/dL   GFR calc non Af Amer 82 (*) >90 mL/min   GFR calc Af Amer >90  >90 mL/min   Comment: (NOTE)     The eGFR has been calculated using the CKD EPI equation.     This calculation has not been validated in all clinical situations.     eGFR's persistently <90 mL/min signify possible Chronic Kidney     Disease.  GLUCOSE, CAPILLARY     Status: None   Collection Time    01/10/14  7:46 AM      Result Value Ref Range   Glucose-Capillary 90  70 - 99 mg/dL   Comment 1 Notify RN    CLOSTRIDIUM  DIFFICILE BY PCR     Status: None   Collection Time    01/10/14 11:16 AM      Result Value Ref Range   C difficile by pcr NEGATIVE  NEGATIVE  GLUCOSE, CAPILLARY     Status: Abnormal   Collection Time    01/10/14 11:40 AM      Result Value Ref Range   Glucose-Capillary 101 (*) 70 - 99 mg/dL   Comment 1 Notify RN    GLUCOSE, CAPILLARY     Status: None   Collection Time    01/10/14  4:58 PM      Result Value Ref Range   Glucose-Capillary 99  70 - 99 mg/dL   Comment 1 Notify RN    GLUCOSE, CAPILLARY     Status: Abnormal   Collection Time    01/10/14  9:34 PM      Result Value Ref Range   Glucose-Capillary 109 (*) 70 - 99 mg/dL      Head: Normocephalic and atraumatic.  Eyes: Conjunctivae are normal. Pupils are equal, round, and reactive to light.  Cardiovascular: An irregularly irregular rhythm present.  Respiratory: Effort normal. No respiratory distress. She has decreased breath sounds in the left lower field. She has rhonchi in the right upper field, the left upper and lower field.  Has paroxsymal episodic productive coughing spells during conversation.  GI: Soft. Bowel sounds are normal. She exhibits no distension. There is no tenderness.  Musculoskeletal: She exhibits edema (2+ pitting edema BLE tibially).  Resting tremor RUE (tardive dyskinesia)  Neurological: She is alert and oriented to person, place, and time.  Pleasant and appropriate. Follows commands without difficulty.  Skin: Skin is warm and dry.  Large bruise upper mid thoracic spine.  Psychiatric: She has a normal mood and affect. Her behavior is normal. Thought content normal.  Laterocollis to Left  Motor strength is 4/5 bilateral deltoid, bicep, tricep, grip, hip flexor, knee extensor, ankle dorsiflexor and plantar flexor   Assessment/Plan: 1. Functional deficits secondary to deconditioning after pneumonia, and multiple medical issues. Recent  fall with trunk,soft tissue trauma  which require 3+ hours per day of  interdisciplinary therapy in a comprehensive inpatient rehab setting. Physiatrist is providing close team supervision and 24 hour management of active medical problems listed below. Physiatrist and rehab team continue to assess barriers to discharge/monitor patient progress toward functional and medical goals. FIM: FIM - Bathing Bathing Steps Patient Completed: Chest;Right Arm;Left Arm;Abdomen;Front perineal area;Right upper leg;Left upper leg;Right lower leg (including foot);Left lower leg (including foot) Bathing: 4: Min-Patient completes 8-9 66f 10 parts or 75+ percent  FIM - Upper Body Dressing/Undressing Upper body dressing/undressing steps patient completed: Thread/unthread right sleeve of pullover shirt/dresss;Thread/unthread left sleeve of pullover shirt/dress;Put head through opening of pull over shirt/dress;Pull shirt over trunk Upper body dressing/undressing: 5: Set-up assist to: Obtain clothing/put away FIM - Lower Body Dressing/Undressing Lower body dressing/undressing steps patient completed: Thread/unthread right underwear leg;Thread/unthread left underwear leg;Thread/unthread right pants leg;Thread/unthread left pants leg Lower body dressing/undressing: 2: Max-Patient completed 25-49% of tasks  FIM - Toileting Toileting steps completed by patient: Adjust clothing after toileting;Performs perineal hygiene Toileting: 3: Mod-Patient completed 2 of 3 steps  FIM - Air cabin crew Transfers: 4-To toilet/BSC: Min A (steadying Pt. > 75%);4-From toilet/BSC: Min A (steadying Pt. > 75%)  FIM - Bed/Chair Transfer Bed/Chair Transfer: 4: Bed > Chair or W/C: Min A (steadying Pt. > 75%)  FIM - Locomotion: Ambulation Ambulation/Gait Assistance: 4: Min assist  Comprehension Comprehension Mode: Auditory Comprehension: 6-Follows complex conversation/direction: With extra time/assistive device  Expression Expression Mode: Verbal Expression: 6-Expresses complex ideas: With extra  time/assistive device  Social Interaction Social Interaction: 6-Interacts appropriately with others with medication or extra time (anti-anxiety, antidepressant).  Problem Solving Problem Solving: 5-Solves basic problems: With no assist  Memory Memory: 5-Recognizes or recalls 90% of the time/requires cueing < 10% of the time  Medical Problem List and Plan:  1. Functional deficits secondary to deconditioning after pneumonia, and multiple medical issues. Recent fall with trunk,soft tissue trauma  2. DVT Prophylaxis/Anticoagulation: Mechanical: Antiembolism stockings, knee (TED hose) Bilateral lower extremities  Sequential compression devices, below knee Bilateral lower extremities  3. Pain Management: Will continue tylenol tid schedule with tramadol prn for moderate to severe pain.  4. Mood: Has a positive outlook. Will have LCSW follow for evaluation and support.  5. Neuropsych: This patient is capable of making decisions on her own behalf.  6. Recurrent Aspiration PNA/E coli UTI: Narrow antibiotics to rocephin as sensitivities available. Will have speech evaluate swallow to decide on appropriate diet. Check follow up CXR in am.  7. Leucocytosis: will recheck in am.  8. A fib:Now on amiodarone as well as digoxin for rate control.  9. Chronic cough: will schedule Tessalon perles. Use tussionex bid prn severe cough.  10 Esophageal cancer s/p resection with severe GERD/duodenal stent: Continue home regimen of PPI bid, Carafate and Reglan.  11. Diarrhea: Likely antibiotic associated. Will order probiotic. Check c diff.  12. Chronic bronchitis with asthma: Continue nebs bid. Will continue Mucinex DM as well as IS while awake.  13.  Mild hypoK likely related to loose stools , supplement  LOS (Days) 2 A FACE TO FACE EVALUATION WAS PERFORMED  Telvin Reinders E 01/11/2014, 10:34 AM

## 2014-01-11 NOTE — Progress Notes (Signed)
Physical Therapy Session Note  Patient Details  Name: KAYDENSE RIZO MRN: [id number] Date of Birth: 1928/11/10  Today's Date: 01/11/2014 Time: 1030-1130 Time Calculation (min): 60 min   Skilled Therapeutic Interventions/Progress Updates:  Gait Training: Gait training with RW with supervision and cues for postural control, and head position. Patient is able to ambulate on a distance over 250-300 feet w/o s/s of fatigue,when asked she stated she could rest but she feels just fine. TA: Stairs 2 x 4 steps with one rail ,reciprocal pattern, SBA. Balance activities in standing on firm surface and on foam to increase patients balance and reduce risk for falls- hanging horseshoes on basketball ring.  Patient complained of discomfort in neck- hot packed applied and followed with ROM and resistive ROM exercises for neck muscles and shoulder complex as well as manual stretching and  Gentle massage to the area.Patient states that it feels much better after tx. Patient returned to room, assisted in the bathroom -stand by assistance to assure safety. Patient left in the room in the recliner with all needs within reach.  Therapy Documentation Precautions:  Precautions Precautions: Fall Restrictions Weight Bearing Restrictions: No Pain: Pain Assessment Pain Score: 0-No pain Pain Type: Acute pain Pain Location: Arm Pain Orientation: Right;Left Pain Descriptors / Indicators: Discomfort Pain Frequency: Intermittent Pain Onset: Gradual Patients Stated Pain Goal: 0 Pain Intervention(s): Medication (See eMAR)  See FIM for current functional status  Therapy/Group: Individual Therapy  Guadlupe Spanish 01/11/2014, 12:34 PM

## 2014-01-12 ENCOUNTER — Inpatient Hospital Stay (HOSPITAL_COMMUNITY): Payer: Medicare Other | Admitting: Physical Therapy

## 2014-01-12 ENCOUNTER — Encounter (HOSPITAL_COMMUNITY): Payer: Medicare Other | Admitting: Occupational Therapy

## 2014-01-12 ENCOUNTER — Inpatient Hospital Stay (HOSPITAL_COMMUNITY): Payer: Medicare Other

## 2014-01-12 ENCOUNTER — Inpatient Hospital Stay (HOSPITAL_COMMUNITY): Payer: Medicare Other | Admitting: Occupational Therapy

## 2014-01-12 LAB — GLUCOSE, CAPILLARY
GLUCOSE-CAPILLARY: 94 mg/dL (ref 70–99)
Glucose-Capillary: 91 mg/dL (ref 70–99)
Glucose-Capillary: 100 mg/dL — ABNORMAL HIGH (ref 70–99)
Glucose-Capillary: 116 mg/dL — ABNORMAL HIGH (ref 70–99)

## 2014-01-12 NOTE — Progress Notes (Signed)
Occupational Therapy Session Note  Patient Details  Name: [name] MRN: [id number] Date of Birth: 09-Sep-1928  Today's Date: 01/12/2014 Time: 1345-1430 Time Calculation (min): 45 min  Short Term Goals: Week 1:  OT Short Term Goal 1 (Week 1): STG=LTG due to short LOS  Skilled Therapeutic Interventions/Progress Updates:    1:1 Focus on toileting with clothing management including changing clothes due to a little BM on her clothing from earlier in the day. Pt requires A for clothing management when in a hurry due to urgency and when clothing is a little snug due to weakness in hands left>right. See BP below- pt c/o light headiness with first ambulation to bathroom (no TEDS on). Donned and BP improved and no c/o. Issued tan theraputty and blue block sponge for strengthen in hand for increase success with clothing management- handout given  Therapy Documentation Precautions:  Precautions Precautions: Fall Restrictions Weight Bearing Restrictions: No    Vital Signs: 161/80 in sitting  Without TEDS  108/77 in standing  Without TEDS Therapy Vitals BP: 132/86 mmHg (after walking with TEDS on) Patient Position (if appropriate): Standing Pain: Pain Assessment Pain Assessment: No/denies pain Pain Score: 0-No pain  See FIM for current functional status  Therapy/Group: Individual Therapy  Willeen Cass [facility] 01/12/2014, 2:58 PM

## 2014-01-12 NOTE — Progress Notes (Signed)
Pt c/o dizziness with ambulation to bathroom during OT session today (see OT note). Returned to sit on edge of bed, BP 161/80. Standing from here, BP 108/77. Donned thigh high teds and encourged pt to drink plenty of fluids. Pt ambulated during therapy with teds on and BP recheck was 132/86 without c/o dizziness. Continue to monitor. Hortencia Conradi RN

## 2014-01-12 NOTE — Plan of Care (Signed)
Problem: RH PAIN MANAGEMENT Goal: RH STG PAIN MANAGED AT OR BELOW PT'S PAIN GOAL Maintains pain level of 2 or less on a scale of 0-10  Outcome: Progressing Scheduled tylenol effective.

## 2014-01-12 NOTE — Progress Notes (Signed)
Occupational Therapy Session Note  Patient Details  Name: [name] MRN: [id number] Date of Birth: 11-21-1928  Today's Date: 01/12/2014 Time: 0920-1015 Time Calculation (min): 55 min  Short Term Goals: Week 1:  OT Short Term Goal 1 (Week 1): STG=LTG due to short LOS  Skilled Therapeutic Interventions/Progress Updates:    1:1 self care retraining at shower level: sitting on chair with hand held shower head simulated like home. Focus on safety with RW with functional ambulation around room, sit to stands, dynamic standing balance (donning shirt in standing), activity tolerance/ endurance, etc. Pt with urinary urgency 3 times in session and urgency with bowels 1x. Pt continues to report this is new since the fall. Pt requires A to doff and donn shirt (over the head and button up type) due to shoulder soreness as a result of fall and remaining in bathtub for prolonged amount of time.    Need to work on whether pt can clean herself up after an accident in depends since plan is to be mod I at home.   Therapy Documentation Precautions:  Precautions Precautions: Fall Restrictions Weight Bearing Restrictions: No Pain: Bilateral arms are sore  See FIM for current functional status  Therapy/Group: Individual Therapy  Willeen Cass [facility] 01/12/2014, 11:00 AM

## 2014-01-12 NOTE — Progress Notes (Signed)
Physical Therapy Session Note  Patient Details  Name: SHANECIA HOGANSON MRN: [id number] Date of Birth: 06/08/1929  Today's Date: 01/12/2014 Time: 1120-1200 Time Calculation (min): 40 min  Short Term Goals: Week 1:  PT Short Term Goal 1 (Week 1): STGs=LTGs due to anticipated LOS  Skilled Therapeutic Interventions/Progress Updates:   Session focused on gait and AROM for head/neck. Pt received sitting in recliner, reporting diarrhea this date. Pt requesting to use bathroom prior to session with close S and min A to complete pulling up pants in standing. Gait training room <> gym 2 x 200 ft using RW with close S, vc's for midline head posture and forward gaze. Sitting in chair with heat pack applied to neck and mirror for visual feedback, pt performed chin tucks, B AROM cervical rotation, flexion/ext, and lateral flexion, gentle overpressure provided to R for stretching. In standing, pt performed scapular retractions to fatigue. Pt tolerated treatment well, reporting improvement in ROM and pain. Pt returned to room and left sitting in recliner with all needs within reach.   Therapy Documentation Precautions:  Precautions Precautions: Fall Restrictions Weight Bearing Restrictions: No  See FIM for current functional status  Therapy/Group: Individual Therapy  Laretta Alstrom 01/12/2014, 1:00 PM

## 2014-01-12 NOTE — Plan of Care (Signed)
Problem: RH BOWEL ELIMINATION Goal: RH STG MANAGE BOWEL WITH ASSISTANCE STG Manage Bowel with mod Assistance.  Outcome: Not Progressing Pt with bowel incontinence x2 today due to diarrhea caused by resource drink (per pt)

## 2014-01-12 NOTE — Progress Notes (Signed)
Physical Therapy Session Note  Patient Details  Name: [name] MRN: [id number] Date of Birth: 08/24/1928  Today's Date: 01/12/2014 Time: 0805-0850 Time Calculation (min): 45 min  Short Term Goals: Week 1:  PT Short Term Goal 1 (Week 1): STGs=LTGs due to anticipated LOS  Skilled Therapeutic Interventions/Progress Updates:  1:1. Pt received sitting in recliner, ready for therapy. Reporting 6/10 pain in B shoulders/neck- reports receiving medication prior to therapy. Pt req close(S) for toleting and lower body dressing, but min A for upper body dressing at start of session. Focus remainder of session on functional strength/endruance. Pt amb 225'x1, 100'x1 and 150'x1 w/ RW and close(S). Noted intermittent deviation to L while amb w/ self-correction and intermittent R toe catch. Cues for posture and decrease speed for overall safety. Pt utilized NuStep for B UE/LE strength and endurance, level 3x39min w/ good tolerance. Heat pack applied to shoulders while working on NuStep for improved extensibility in nuck musculature. Upon completion of NuStep pt stating to need bathroom again. Pt incontinent of bowel upon return to room, req mod A for clean up and changing clothings. Pt in bathroom at end of session in care of RN to further assist.   Therapy Documentation Precautions:  Precautions Precautions: Fall Restrictions Weight Bearing Restrictions: No  See FIM for current functional status  Therapy/Group: Individual Therapy  Gilmore Laroche 01/12/2014, 11:53 AM

## 2014-01-12 NOTE — Progress Notes (Signed)
80 y.o. female with history of A fib, HTN, esophageal cancer s/p esophagectomy with gastric pull thorough, severe GERD, gastroparesis, recurrent aspiration PNA for past 6 months; who was found by her neighbor in her garden tub on 01/06/14. She reported having tripped and fallen in the tub about 12 hours prior to being found. Family reported recent cold type symptoms for the past few weeks with decrease in po intake as well as recent d/c of coumadin due to hematemesis. CT head without acute changes. CT cervical spine with mild spinal stenosis with cord flattening and dilated esophagus. She was started on IV antibiotics for LLL aspiration PNA and therapies ordered. Digoxin discontinued as family question A fib history. She develop A Fib with RVR 150-170 requiring IV metoprolol and amiodarone. 2D echo with EF 60-65%   Subjective/Complaints: Slept better with Ambien   Having diarrhea but no abdominal pain Objective: Vital Signs: Blood pressure 123/75, pulse 77, temperature 97.7 F (36.5 C), temperature source Oral, resp. rate 18, height 5' (1.524 m), weight 56.1 kg (123 lb 10.9 oz), SpO2 93.00%. Dg Chest 2 View  01/10/2014   CLINICAL DATA:  Pneumonia.  EXAM: CHEST  2 VIEW  COMPARISON:  01/08/2014  FINDINGS: The heart is enlarged but stable. Stable surgical changes with an esophageal stent in place. The right PICC line is stable. There is persistent left lower lobe opacity most of which is account for by the gastric pull-through and adjacent atelectasis. A small effusion is also probable.  IMPRESSION: Stable surgical changes related to prior gastric pull-through and esophageal stent. There is persistent atelectasis around the stomach and a probable small left pleural effusion.   Electronically Signed   By: Kalman Jewels M.D.   On: 01/10/2014 13:31   Results for orders placed during the hospital encounter of 01/09/14 (from the past 72 hour(s))  GLUCOSE, CAPILLARY     Status: Abnormal   Collection Time    01/09/14 10:37 PM      Result Value Ref Range   Glucose-Capillary 110 (*) 70 - 99 mg/dL  CBC WITH DIFFERENTIAL     Status: Abnormal   Collection Time    01/10/14  5:00 AM      Result Value Ref Range   WBC 7.2  4.0 - 10.5 K/uL   RBC 4.28  3.87 - 5.11 MIL/uL   Hemoglobin 10.4 (*) 12.0 - 15.0 g/dL   HCT 33.5 (*) 36.0 - 46.0 %   MCV 78.3  78.0 - 100.0 fL   MCH 24.3 (*) 26.0 - 34.0 pg   MCHC 31.0  30.0 - 36.0 g/dL   RDW 15.8 (*) 11.5 - 15.5 %   Platelets 383  150 - 400 K/uL   Neutrophils Relative % 60  43 - 77 %   Neutro Abs 4.4  1.7 - 7.7 K/uL   Lymphocytes Relative 26  12 - 46 %   Lymphs Abs 1.9  0.7 - 4.0 K/uL   Monocytes Relative 12  3 - 12 %   Monocytes Absolute 0.8  0.1 - 1.0 K/uL   Eosinophils Relative 2  0 - 5 %   Eosinophils Absolute 0.2  0.0 - 0.7 K/uL   Basophils Relative 0  0 - 1 %   Basophils Absolute 0.0  0.0 - 0.1 K/uL  COMPREHENSIVE METABOLIC PANEL     Status: Abnormal   Collection Time    01/10/14  5:00 AM      Result Value Ref Range   Sodium 141  137 -  147 mEq/L   Potassium 3.6 (*) 3.7 - 5.3 mEq/L   Chloride 102  96 - 112 mEq/L   CO2 28  19 - 32 mEq/L   Glucose, Bld 86  70 - 99 mg/dL   BUN 3 (*) 6 - 23 mg/dL   Creatinine, Ser 0.61  0.50 - 1.10 mg/dL   Calcium 8.4  8.4 - 10.5 mg/dL   Total Protein 4.6 (*) 6.0 - 8.3 g/dL   Albumin 2.0 (*) 3.5 - 5.2 g/dL   AST 17  0 - 37 U/L   ALT 31  0 - 35 U/L   Alkaline Phosphatase 71  39 - 117 U/L   Total Bilirubin 0.3  0.3 - 1.2 mg/dL   GFR calc non Af Amer 82 (*) >90 mL/min   GFR calc Af Amer >90  >90 mL/min   Comment: (NOTE)     The eGFR has been calculated using the CKD EPI equation.     This calculation has not been validated in all clinical situations.     eGFR's persistently <90 mL/min signify possible Chronic Kidney     Disease.  GLUCOSE, CAPILLARY     Status: None   Collection Time    01/10/14  7:46 AM      Result Value Ref Range   Glucose-Capillary 90  70 - 99 mg/dL   Comment 1 Notify RN    CLOSTRIDIUM  DIFFICILE BY PCR     Status: None   Collection Time    01/10/14 11:16 AM      Result Value Ref Range   C difficile by pcr NEGATIVE  NEGATIVE  GLUCOSE, CAPILLARY     Status: Abnormal   Collection Time    01/10/14 11:40 AM      Result Value Ref Range   Glucose-Capillary 101 (*) 70 - 99 mg/dL   Comment 1 Notify RN    GLUCOSE, CAPILLARY     Status: None   Collection Time    01/10/14  4:58 PM      Result Value Ref Range   Glucose-Capillary 99  70 - 99 mg/dL   Comment 1 Notify RN    GLUCOSE, CAPILLARY     Status: Abnormal   Collection Time    01/10/14  9:34 PM      Result Value Ref Range   Glucose-Capillary 109 (*) 70 - 99 mg/dL  GLUCOSE, CAPILLARY     Status: Abnormal   Collection Time    01/11/14 11:32 AM      Result Value Ref Range   Glucose-Capillary 108 (*) 70 - 99 mg/dL   Comment 1 Notify RN    GLUCOSE, CAPILLARY     Status: Abnormal   Collection Time    01/11/14  4:26 PM      Result Value Ref Range   Glucose-Capillary 111 (*) 70 - 99 mg/dL   Comment 1 Notify RN    GLUCOSE, CAPILLARY     Status: Abnormal   Collection Time    01/11/14  8:15 PM      Result Value Ref Range   Glucose-Capillary 116 (*) 70 - 99 mg/dL  GLUCOSE, CAPILLARY     Status: None   Collection Time    01/12/14  7:32 AM      Result Value Ref Range   Glucose-Capillary 91  70 - 99 mg/dL   Comment 1 Notify RN        Head: Normocephalic and atraumatic.  Eyes: Conjunctivae are normal. Pupils are  equal, round, and reactive to light.  Cardiovascular: An irregularly irregular rhythm present.  Respiratory: Effort normal. No respiratory distress. She has decreased breath sounds in the left lower field. She has no rhonchi  Has paroxsymal episodic productive coughing spells during conversation.  GI: Soft. Bowel sounds are normal. She exhibits no distension. There is no tenderness.  Musculoskeletal: She exhibits edema (2+ pitting edema BLE tibially).  Resting tremor RUE (tardive dyskinesia)  Neurological:  She is alert and oriented to person, place, and time.  Pleasant and appropriate. Follows commands without difficulty.  Skin: Skin is warm and dry.  Large bruise upper mid thoracic spine.  Psychiatric: She has a normal mood and affect. Her behavior is normal. Thought content normal.  Laterocollis to Left  Motor strength is 4/5 bilateral deltoid, bicep, tricep, grip, hip flexor, knee extensor, ankle dorsiflexor and plantar flexor   Assessment/Plan: 1. Functional deficits secondary to deconditioning after pneumonia, and multiple medical issues. Recent fall with trunk,soft tissue trauma  which require 3+ hours per day of interdisciplinary therapy in a comprehensive inpatient rehab setting. Physiatrist is providing close team supervision and 24 hour management of active medical problems listed below. Physiatrist and rehab team continue to assess barriers to discharge/monitor patient progress toward functional and medical goals. FIM: FIM - Bathing Bathing Steps Patient Completed: Chest;Right Arm;Left Arm;Abdomen;Front perineal area;Right upper leg;Left upper leg;Right lower leg (including foot);Left lower leg (including foot) Bathing: 4: Min-Patient completes 8-9 21f 10 parts or 75+ percent  FIM - Upper Body Dressing/Undressing Upper body dressing/undressing steps patient completed: Thread/unthread right sleeve of pullover shirt/dresss;Thread/unthread left sleeve of pullover shirt/dress;Put head through opening of pull over shirt/dress;Pull shirt over trunk;Hook/unhook bra Upper body dressing/undressing: 4: Min-Patient completed 75 plus % of tasks FIM - Lower Body Dressing/Undressing Lower body dressing/undressing steps patient completed: Thread/unthread right pants leg;Thread/unthread left pants leg;Don/Doff right sock;Don/Doff left sock;Pull pants up/down;Don/Doff left shoe;Don/Doff right shoe Lower body dressing/undressing: 5: Set-up assist to: Obtain clothing  FIM - Toileting Toileting steps  completed by patient: Adjust clothing after toileting;Performs perineal hygiene;Adjust clothing prior to toileting Toileting: 5: Supervision: Safety issues/verbal cues  FIM - Air cabin crew Transfers Assistive Devices: Insurance account manager Transfers: 5-To toilet/BSC: Supervision (verbal cues/safety issues);5-From toilet/BSC: Supervision (verbal cues/safety issues)  FIM - Control and instrumentation engineer Devices: Copy: 5: Bed > Chair or W/C: Supervision (verbal cues/safety issues);5: Chair or W/C > Bed: Supervision (verbal cues/safety issues)  FIM - Locomotion: Wheelchair Locomotion: Wheelchair: 0: Activity did not occur FIM - Locomotion: Ambulation Locomotion: Ambulation Assistive Devices: Administrator Ambulation/Gait Assistance: 5: Supervision Locomotion: Ambulation: 5: Travels 150 ft or more with supervision/safety issues  Comprehension Comprehension Mode: Auditory Comprehension: 6-Follows complex conversation/direction: With extra time/assistive device  Expression Expression Mode: Verbal Expression: 6-Expresses complex ideas: With extra time/assistive device  Social Interaction Social Interaction: 7-Interacts appropriately with others - No medications needed.  Problem Solving Problem Solving: 5-Solves basic 90% of the time/requires cueing < 10% of the time  Memory Memory: 5-Recognizes or recalls 90% of the time/requires cueing < 10% of the time  Medical Problem List and Plan:  1. Functional deficits secondary to deconditioning after pneumonia, and multiple medical issues. Recent fall with trunk,soft tissue trauma  2. DVT Prophylaxis/Anticoagulation: Mechanical: Antiembolism stockings, knee (TED hose) Bilateral lower extremities  Sequential compression devices, below knee Bilateral lower extremities  3. Pain Management: Will continue tylenol tid schedule with tramadol prn for moderate to severe pain.  4. Mood: Has a positive outlook.  Will have  LCSW follow for evaluation and support.  5. Neuropsych: This patient is capable of making decisions on her own behalf.  6. Recurrent Aspiration PNA/E coli UTI: Narrow antibiotics to rocephin as sensitivities available. Will have speech evaluate swallow to decide on appropriate diet. Check follow up CXR in am.  7. Leucocytosis: will recheck in am.  8. A fib:Now on amiodarone as well as digoxin for rate control.  9. Chronic cough: will schedule Tessalon perles. Use tussionex bid prn severe cough.  10 Esophageal cancer s/p resection with severe GERD/duodenal stent: Continue home regimen of PPI bid, Carafate and Reglan.  11. Diarrhea: Likely antibiotic associated. Will order probiotic.  c diff. Neg off contact prec 12. Chronic bronchitis with asthma: Continue nebs bid. Will continue Mucinex DM as well as IS while awake.  13.  Mild hypoK likely related to loose stools , supplement  LOS (Days) 3 A FACE TO FACE EVALUATION WAS PERFORMED  KIRSTEINS,ANDREW E 01/12/2014, 9:19 AM

## 2014-01-12 NOTE — Progress Notes (Signed)
Restless night. Frequent periods of coughing spells. Cough congested with yellow sputum at times. PRN tussionex and PRN ambien given at HS with min.effectiveness. At 0242 complained of nausea, PRN compazine given.[name] A

## 2014-01-13 ENCOUNTER — Inpatient Hospital Stay (HOSPITAL_COMMUNITY): Payer: Medicare Other

## 2014-01-13 ENCOUNTER — Encounter (HOSPITAL_COMMUNITY): Payer: Medicare Other

## 2014-01-13 ENCOUNTER — Inpatient Hospital Stay (HOSPITAL_COMMUNITY): Payer: Medicare Other | Admitting: Speech Pathology

## 2014-01-13 ENCOUNTER — Inpatient Hospital Stay (HOSPITAL_COMMUNITY): Payer: Medicare Other | Admitting: *Deleted

## 2014-01-13 DIAGNOSIS — R5381 Other malaise: Secondary | ICD-10-CM

## 2014-01-13 LAB — CULTURE, BLOOD (ROUTINE X 2)
CULTURE: NO GROWTH
Culture: NO GROWTH

## 2014-01-13 LAB — GLUCOSE, CAPILLARY
GLUCOSE-CAPILLARY: 84 mg/dL (ref 70–99)
GLUCOSE-CAPILLARY: 116 mg/dL — AB (ref 70–99)
Glucose-Capillary: 89 mg/dL (ref 70–99)
Glucose-Capillary: 131 mg/dL — ABNORMAL HIGH (ref 70–99)
Glucose-Capillary: 135 mg/dL — ABNORMAL HIGH (ref 70–99)

## 2014-01-13 NOTE — Progress Notes (Signed)
80 y.o. female with history of A fib, HTN, esophageal cancer s/p esophagectomy with gastric pull thorough, severe GERD, gastroparesis, recurrent aspiration PNA for past 6 months; who was found by her neighbor in her garden tub on 01/06/14. She reported having tripped and fallen in the tub about 12 hours prior to being found. Family reported recent cold type symptoms for the past few weeks with decrease in po intake as well as recent d/c of coumadin due to hematemesis. CT head without acute changes. CT cervical spine with mild spinal stenosis with cord flattening and dilated esophagus. She was started on IV antibiotics for LLL aspiration PNA and therapies ordered. Digoxin discontinued as family question A fib history. She develop A Fib with RVR 150-170 requiring IV metoprolol and amiodarone. 2D echo with EF 60-65%   Subjective/Complaints: Had another good night. Stool consistency better. Feels she's getting stronger.   Having diarrhea but no abdominal pain Objective: Vital Signs: Blood pressure 118/78, pulse 80, temperature 97.2 F (36.2 C), temperature source Oral, resp. rate 20, height 5' (1.524 m), weight 56.1 kg (123 lb 10.9 oz), SpO2 93.00%. No results found. Results for orders placed during the hospital encounter of 01/09/14 (from the past 72 hour(s))  CLOSTRIDIUM DIFFICILE BY PCR     Status: None   Collection Time    01/10/14 11:16 AM      Result Value Ref Range   C difficile by pcr NEGATIVE  NEGATIVE  GLUCOSE, CAPILLARY     Status: Abnormal   Collection Time    01/10/14 11:40 AM      Result Value Ref Range   Glucose-Capillary 101 (*) 70 - 99 mg/dL   Comment 1 Notify RN    GLUCOSE, CAPILLARY     Status: None   Collection Time    01/10/14  4:58 PM      Result Value Ref Range   Glucose-Capillary 99  70 - 99 mg/dL   Comment 1 Notify RN    GLUCOSE, CAPILLARY     Status: Abnormal   Collection Time    01/10/14  9:34 PM      Result Value Ref Range   Glucose-Capillary 109 (*) 70 - 99  mg/dL  GLUCOSE, CAPILLARY     Status: None   Collection Time    01/11/14  7:26 AM      Result Value Ref Range   Glucose-Capillary 84  70 - 99 mg/dL   Comment 1 Notify RN    GLUCOSE, CAPILLARY     Status: Abnormal   Collection Time    01/11/14 11:32 AM      Result Value Ref Range   Glucose-Capillary 108 (*) 70 - 99 mg/dL   Comment 1 Notify RN    GLUCOSE, CAPILLARY     Status: Abnormal   Collection Time    01/11/14  4:26 PM      Result Value Ref Range   Glucose-Capillary 111 (*) 70 - 99 mg/dL   Comment 1 Notify RN    GLUCOSE, CAPILLARY     Status: Abnormal   Collection Time    01/11/14  8:15 PM      Result Value Ref Range   Glucose-Capillary 116 (*) 70 - 99 mg/dL  GLUCOSE, CAPILLARY     Status: None   Collection Time    01/12/14  7:32 AM      Result Value Ref Range   Glucose-Capillary 91  70 - 99 mg/dL   Comment 1 Notify RN  GLUCOSE, CAPILLARY     Status: None   Collection Time    01/12/14 11:42 AM      Result Value Ref Range   Glucose-Capillary 94  70 - 99 mg/dL   Comment 1 Notify RN    GLUCOSE, CAPILLARY     Status: Abnormal   Collection Time    01/12/14  4:45 PM      Result Value Ref Range   Glucose-Capillary 100 (*) 70 - 99 mg/dL  GLUCOSE, CAPILLARY     Status: Abnormal   Collection Time    01/12/14  9:18 PM      Result Value Ref Range   Glucose-Capillary 116 (*) 70 - 99 mg/dL  GLUCOSE, CAPILLARY     Status: None   Collection Time    01/13/14  7:23 AM      Result Value Ref Range   Glucose-Capillary 89  70 - 99 mg/dL   Comment 1 Notify RN        Head: Normocephalic and atraumatic.  Eyes: Conjunctivae are normal. Pupils are equal, round, and reactive to light.  Cardiovascular: An irregularly irregular rhythm present.  Respiratory: Effort normal. No respiratory distress. She has decreased breath sounds in the left lower field. She has no rhonchi  Has paroxsymal episodic productive coughing spells during conversation.  GI: Soft. Bowel sounds are normal. She  exhibits no distension. There is no tenderness.  Musculoskeletal: She exhibits edema (2+ pitting edema BLE tibially).  Resting tremor RUE (tardive dyskinesia)  Neurological: She is alert and oriented to person, place, and time.  Pleasant and appropriate. Follows commands without difficulty.  Skin: Skin is warm and dry.  Large bruise upper mid thoracic spine.  Psychiatric: She has a normal mood and affect. Her behavior is normal. Thought content normal.  Laterocollis to Left  Motor strength is 4/5 bilateral deltoid, bicep, tricep, grip, hip flexor, knee extensor, ankle dorsiflexor and plantar flexor. Tremors BUE   Assessment/Plan: 1. Functional deficits secondary to deconditioning after pneumonia, and multiple medical issues. Recent fall with trunk,soft tissue trauma  which require 3+ hours per day of interdisciplinary therapy in a comprehensive inpatient rehab setting. Physiatrist is providing close team supervision and 24 hour management of active medical problems listed below. Physiatrist and rehab team continue to assess barriers to discharge/monitor patient progress toward functional and medical goals.   FIM: FIM - Bathing Bathing Steps Patient Completed: Chest;Right Arm;Left Arm;Abdomen;Front perineal area;Right upper leg;Left upper leg;Right lower leg (including foot);Left lower leg (including foot);Buttocks Bathing: 4: Steadying assist  FIM - Upper Body Dressing/Undressing Upper body dressing/undressing steps patient completed: Thread/unthread right sleeve of pullover shirt/dresss;Thread/unthread left sleeve of pullover shirt/dress;Put head through opening of pull over shirt/dress;Thread/unthread right bra strap;Thread/unthread left bra strap;Hook/unhook bra Upper body dressing/undressing: 4: Min-Patient completed 75 plus % of tasks FIM - Lower Body Dressing/Undressing Lower body dressing/undressing steps patient completed: Thread/unthread right pants leg;Thread/unthread left pants  leg;Don/Doff right sock;Don/Doff left sock;Don/Doff left shoe;Don/Doff right shoe;Thread/unthread right underwear leg;Thread/unthread left underwear leg;Pull underwear up/down Lower body dressing/undressing: 4: Min-Patient completed 75 plus % of tasks  FIM - Toileting Toileting steps completed by patient: Adjust clothing prior to toileting;Performs perineal hygiene;Adjust clothing after toileting Toileting: 5: Supervision: Safety issues/verbal cues  FIM - Air cabin crew Transfers Assistive Devices: Insurance account manager Transfers: 5-To toilet/BSC: Supervision (verbal cues/safety issues);5-From toilet/BSC: Supervision (verbal cues/safety issues)  FIM - Control and instrumentation engineer Devices: Walker;Arm rests Bed/Chair Transfer: 5: Bed > Chair or W/C: Supervision (verbal cues/safety issues);5: Chair or  W/C > Bed: Supervision (verbal cues/safety issues)  FIM - Locomotion: Wheelchair Locomotion: Wheelchair: 0: Activity did not occur FIM - Locomotion: Ambulation Locomotion: Ambulation Assistive Devices: Administrator Ambulation/Gait Assistance: 5: Supervision Locomotion: Ambulation: 5: Travels 150 ft or more with supervision/safety issues  Comprehension Comprehension Mode: Auditory Comprehension: 6-Follows complex conversation/direction: With extra time/assistive device  Expression Expression Mode: Verbal Expression: 6-Expresses complex ideas: With extra time/assistive device  Social Interaction Social Interaction: 7-Interacts appropriately with others - No medications needed.  Problem Solving Problem Solving: 5-Solves complex 90% of the time/cues < 10% of the time  Memory Memory: 5-Recognizes or recalls 90% of the time/requires cueing < 10% of the time  Medical Problem List and Plan:  1. Functional deficits secondary to deconditioning after pneumonia, and multiple medical issues. Recent fall with trunk,soft tissue trauma  2. DVT Prophylaxis/Anticoagulation:  Mechanical: Antiembolism stockings, knee (TED hose) Bilateral lower extremities  Sequential compression devices, below knee Bilateral lower extremities  3. Pain Management: Will continue tylenol tid schedule with tramadol prn for moderate to severe pain.  4. Mood: Has a positive outlook. Will have LCSW follow for evaluation and support.  5. Neuropsych: This patient is capable of making decisions on her own behalf.  6. Recurrent Aspiration PNA/E coli UTI: Narrow antibiotics to rocephin as sensitivities available.   7. Leucocytosis:  resolved  8. A fib: Now on amiodarone as well as digoxin for rate control.  9. Chronic cough: will schedule Tessalon perles. Use tussionex bid prn severe cough.  10 Esophageal cancer s/p resection with severe GERD/duodenal stent: Continue home regimen of PPI bid, Carafate and Reglan.  11. Diarrhea: stool consistency better 12. Chronic bronchitis with asthma: Continue nebs bid.  continue Mucinex DM as well as IS while awake.  13.  Mild hypoK likely related to loose stools , supplement  LOS (Days) 4 A FACE TO FACE EVALUATION WAS PERFORMED  Tyja Gortney T 01/13/2014, 9:10 AM

## 2014-01-13 NOTE — Progress Notes (Signed)
Speech Language Pathology Daily Session Note  Patient Details  Name: [name] MRN: [id number] Date of Birth: 03/31/1929  Today's Date: 01/13/2014 Time: 7425-9563 Time Calculation (min): 30 min  Short Term Goals: Week 1: SLP Short Term Goal 1 (Week 1): Pt will tolerate her currently prescribed diet with no overt s/s of aspiration with modified independence.  SLP Short Term Goal 2 (Week 1): Pt will participate in an objective swallow study to determine safest diet consistency.    Skilled Therapeutic Interventions:  Pt was seen for skilled speech therapy targeting skilled education.  SLP reviewed and reinforced results and recommendations of today's MBS, including recommendations for GI follow up.  Pt verbalized understanding of recommendations and all questions were answered to her satisfaction at this time. Pt endorsed no difficulty swallowing during lunch and is modified independent for use of swallowing precautions during meals.  Continue per current plan of care for brief SLP follow up to complete education.   FIM:  Comprehension Comprehension Mode: Auditory Comprehension: 6-Follows complex conversation/direction: With extra time/assistive device Expression Expression Mode: Verbal Expression: 6-Expresses complex ideas: With extra time/assistive device Social Interaction Social Interaction: 7-Interacts appropriately with others - No medications needed. Problem Solving Problem Solving: 5-Solves complex 90% of the time/cues < 10% of the time Memory Memory: 5-Recognizes or recalls 90% of the time/requires cueing < 10% of the time FIM - Eating Eating Activity: 6: More than reasonable amount of time  Pain Pain Assessment Pain Assessment: No/denies pain Pain Score: 3   Therapy/Group: Individual Therapy  Windell Moulding, M.A. CCC-SLP  Nizar Cutler, Selinda Orion 01/13/2014, 4:38 PM

## 2014-01-13 NOTE — Progress Notes (Signed)
Recreational Therapy Session Note  Patient Details  Name: [name] MRN: [id number] Date of Birth: 12-27-1928 Today's Date: 01/13/2014  Order received & chart reviewed.  Met with pt to discuss TR services.  Pt with limited interest, no formal TR services implemented.  Will continue to monitor. [place] 01/13/2014, 4:08 PM

## 2014-01-13 NOTE — Procedures (Signed)
Objective Swallowing Evaluation: Modified Barium Swallowing Study  Patient Details  Name: [name] MRN: [id number] Date of Birth: [birth year]  Today's Date: 01/13/2014 Time: 8242-3536 Time Calculation (min): 41 min  Past Medical History:  Past Medical History  Diagnosis Date  . GERD (gastroesophageal reflux disease)   . Osteoporosis   . Postmenopausal HRT (hormone replacement therapy)   . Anemia   . Hypertension   . Allergy   . Asthma   . Shortness of breath   . PONV (postoperative nausea and vomiting)   . Dysrhythmia     a fib  . Esophageal cancer     removed esophagus stomach pulled up  . Basal cell carcinoma of face     "several; freezes them off"  . Pneumonia     "all the time; probably 5 times in the last year, counting today" (01/06/2014)  . Chronic bronchitis     "practically q winter" (01/06/2014)  . History of blood transfusion 1993    "related to esophagus removal" (01/06/2014)  . Headache(784.0)     "monthly in the past year" (01/06/2014)  . Arthritis     "legs" (01/06/2014)   Past Surgical History:  Past Surgical History  Procedure Laterality Date  . Left oophorectomy  1952  . Esophageal removal of cancer,gastric ppull-thru 1993    . Forearm fracture surgery Right ?1990's  . Esophagogastroduodenoscopy  10/19/2011    Procedure: ESOPHAGOGASTRODUODENOSCOPY (EGD);  Surgeon: Milus Banister, MD;  Location: [facility];  Service: Endoscopy;  Laterality: N/A;  . Balloon dilation  10/19/2011    Procedure: BALLOON DILATION;  Surgeon: Milus Banister, MD;  Location: [facility];  Service: Endoscopy;  Laterality: N/A;  . Esophagogastroduodenoscopy  11/07/2011    Procedure: ESOPHAGOGASTRODUODENOSCOPY (EGD);  Surgeon: Ladene Artist, MD,FACG;  Location[facility];  Service: Endoscopy;  Laterality: N/A;  . Esophagogastroduodenoscopy  11/22/2011    Procedure: ESOPHAGOGASTRODUODENOSCOPY (EGD);  Surgeon: Inda Castle, MD;  Location: [facility];  Service:  Endoscopy;  Laterality: N/A;  . Esophagogastroduodenoscopy  11/24/2011    Procedure: ESOPHAGOGASTRODUODENOSCOPY (EGD);  Surgeon: Inda Castle, MD;  Location: [facility];  Service: Endoscopy;  Laterality: N/A;  with removable duodenal stent (actually using esophageal partially covered 23X15 located in locked supply room  . Duodenal stent placement  11/24/2011    Procedure: DUODENAL STENT PLACEMENT;  Surgeon: Inda Castle, MD;  Location: [facility];  Service: Endoscopy;  Laterality: N/A;  . Video bronchoscopy Bilateral 01/18/2013    Procedure: VIDEO BRONCHOSCOPY WITHOUT FLUORO;  Surgeon: Tanda Rockers, MD;  Location: [facility]OS[facility];  Service: Cardiopulmonary;  Laterality: Bilateral;  . Esophagogastroduodenoscopy N/A 02/11/2013    Procedure: ESOPHAGOGASTRODUODENOSCOPY (EGD);  Surgeon: Inda Castle, MD;  Location: [facility];  Service: Endoscopy;  Laterality: N/A;  . Duodenal stent placement N/A 02/11/2013    Procedure: DUODENAL STENT PLACEMENT;  Surgeon: Inda Castle, MD;  Location: [facility];  Service: Endoscopy;  Laterality: N/A;  . Esophagogastroduodenoscopy N/A 09/11/2013    Procedure: ESOPHAGOGASTRODUODENOSCOPY (EGD);  Surgeon: Gatha Mayer, MD;  Location: [facility];  Service: Endoscopy;  Laterality: N/A;  . Cholecystectomy  ~ 1996  . Dilation and curettage of uterus  X 6-7  . Cataract extraction w/ intraocular lens  implant, bilateral Bilateral ~ 2000   HPI:  [name] is an 80 y.o. female with history of A fib, HTN, esophageal cancer s/p esophagectomy with gastric pull through, severe GERD, gastroparesis, recurrent aspiration PNA for past 6 months; who was found  by her neighbor in her garden tub on 01/06/14. Family reported recent cold type symptoms for the past few weeks with decrease in po intake as well as recent d/c of coumadin due to hematemesis. CT head without acute changes.  She was started on IV antibiotics for LLL aspiration PNA and therapies ordered. Has  had neck tilting towards the left side since her surgery for the esophageal cancer.       Recommendation/Prognosis  Clinical Impression:   Dysphagia Diagnosis: Moderate cervical esophageal phase dysphagia Pt presents with an overall functional oropharyngeal swallowing mechanism.  Deviation from normal oropharyngeal physiology included a mild delay in swallow initiation to the level of the vallecular which did not appear to impact the overall integrity of the swallow.   Most remarkable on this exam was evidence of post-surgical changes from esophagectomy in 1993.  Difficult to ascertain baseline swallowing function based on today's objective swallow study and no history of MBS is available in chart.   Pt has what appears to be similar to a CP bar at c5-c6 junction resulting in moderate residue of both solids and liquids.  Residue was cleared to minimal-mild with multiple swallows and/or thin liquid wash and did not impact the pt's swallowing safety, therefore MD was not consulted at this time.  Given pt's complicated and prolonged GI history, pt remains at an increased risk of aspiration; however, pt is effectively protecting her airway and no aspiration was visualized on this study.   Pt would benefit from GI follow up as SLP suspects this is likely the primary contributing factor to pt's recurrent aspiration pneumonia versus pharyngeal strength and/or coordination.    Swallow Evaluation Recommendations:  Recommended Consults: Consider GI evaluation Diet Recommendations: Regular;Thin liquid Liquid Administration via: Cup;Straw Supervision: Full supervision/cueing for compensatory strategies (RN reported on initial eval that family requests full supervision during meals ) Compensations: Slow rate;Follow solids with liquid;Small sips/bites Postural Changes and/or Swallow Maneuvers: Out of bed for meals;Seated upright 90 degrees;Upright 30-60 min after meal Oral Care Recommendations: Oral care  BID Follow up Recommendations: None    Prognosis:  Prognosis for Safe Diet Advancement: Good Barriers to Reach Goals: Time post onset   Individuals Consulted: Consulted and Agree with Results and Recommendations: Patient Report Sent to : Referring physician      SLP Assessment/Plan  Plan:   Please see treatment plan outlined in IPOC   Short Term Goals: Week 1: SLP Short Term Goal 1 (Week 1): Pt will tolerate her currently prescribed diet with no overt s/s of aspiration with modified independence.  SLP Short Term Goal 2 (Week 1): Pt will participate in an objective swallow study to determine safest diet consistency.      General: Date of Onset: 01/06/14 Type of Study: Modified Barium Swallowing Study Reason for Referral: Objectively evaluate swallowing function Previous Swallow Assessment: pt reports history of "swallow tests" in the setting of esophagectomy, no MBS noted per chart review Diet Prior to this Study: Regular;Thin liquids Temperature Spikes Noted: No Respiratory Status: Room air History of Recent Intubation: No Behavior/Cognition: Alert;Cooperative;Pleasant mood Oral Cavity - Dentition: Adequate natural dentition Oral Motor / Sensory Function: Within functional limits Self-Feeding Abilities: Able to feed self Patient Positioning: Upright in chair Baseline Vocal Quality: Clear Volitional Cough: Strong Anatomy: Other (Comment) (C2-C3 and C5-C7 boney prominences) Pharyngeal Secretions: Not observed secondary MBS   Reason for Referral:   Objectively evaluate swallowing function    Oral Phase: Oral Preparation/Oral Phase Oral Phase: WFL   Pharyngeal Phase:  Pharyngeal Phase Pharyngeal Phase: Within functional limits   Cervical Esophageal Phase  Cervical Esophageal Phase Cervical Esophageal Phase: Impaired Cervical Esophageal Phase - Thin Thin Cup: Prominent cricopharyngeal segment;Other (Comment) (anterior protusion at the level of the cricopharyngeal  segment) Thin Straw: Prominent cricopharyngeal segment;Other (Comment) (anterior protusion at the level of the cricopharyngeal segment) Cervical Esophageal Phase - Solids Puree: Prominent cricopharyngeal segment;Other (Comment) (anterior protusion at the level of the cricopharyngeal segment) Regular: Prominent cricopharyngeal segment;Other (Comment) (anterior protusion at the level of the cricopharyngeal segment)   GN        Windell Moulding, M.A. CCC-SLP  Vedant Shehadeh, Elmyra Ricks L 01/13/2014, 10:30 AM

## 2014-01-13 NOTE — Care Management Note (Signed)
[facility] Individual Statement of Services  Patient Name:  [name]  Date:  01/13/2014  Welcome to the [facility].  Our goal is to provide you with an individualized program based on your diagnosis and situation, designed to meet your specific needs.  With this comprehensive rehabilitation program, you will be expected to participate in at least 3 hours of rehabilitation therapies Monday-Friday, with modified therapy programming on the weekends.  Your rehabilitation program will include the following services:  Physical Therapy (PT), Occupational Therapy (OT), Speech Therapy (ST), 24 hour per day rehabilitation nursing, Therapeutic Recreaction (TR), Case Management (Social Worker), Rehabilitation Medicine, Nutrition Services and Pharmacy Services  Weekly team conferences will be held on Tuesdays to discuss your progress.  Your Social Worker will talk with you frequently to get your input and to update you on team discussions.  Team conferences with you and your family in attendance may also be held.  Expected length of stay: 7-10 days  Overall anticipated outcome: modified independent  Depending on your progress and recovery, your program may change. Your Social Worker will coordinate services and will keep you informed of any changes. Your Social Worker's name and contact numbers are listed  below.  The following services may also be recommended but are not provided by the [facility] will be made to provide these services after discharge if needed.  Arrangements include referral to agencies that provide these services.  Your insurance has been verified to be:  Medicare and [employer] Your primary doctor is:  Roxy Cedar  Pertinent information will be shared with your doctor and your insurance  company.  Social Worker:  [place], [place] or ([phone]   Information discussed with and copy given to patient by: Lennart Pall, 01/13/2014, 9:15 AM

## 2014-01-13 NOTE — Progress Notes (Signed)
Social Work Social Work Assessment and Plan  Patient Details  Name: [name] MRN: [id number] Date of Birth: [birth year]  Today's Date: 01/13/2014  Problem List:  Patient Active Problem List   Diagnosis Date Noted  . Multifactorial gait disorder 01/09/2014  . Fall at home 01/06/2014  . Chronic diastolic CHF (congestive heart failure) 09/15/2013  . Pulmonary hypertension 09/15/2013  . Hematemesis 09/10/2013  . Acute blood loss anemia 09/10/2013  . CAP (community acquired pneumonia) 09/09/2013  . Sepsis 09/09/2013  . PNA (pneumonia) 09/09/2013  . A-fib 09/09/2013  . Unspecified asthma(493.90) 09/09/2013  . Encounter for therapeutic drug monitoring 09/06/2013  . Hx of esophagectomy 08/02/2013  . Anticoagulant long-term use 08/02/2013  . Aspiration pneumonia 07/26/2013  . Tremor 05/29/2013  . Edema 05/14/2013  . Aortic regurgitation 05/14/2013  . Hemoptysis 01/15/2013  . Atrial fibrillation with rapid ventricular response 11/23/2011  . Gastric outlet obstruction 11/05/2011  . Esophageal stricture 10/19/2011  . Asthma with bronchitis 11/24/2010  . HX OF ESOPHAGEAL CANCER 08/29/2008  . HYPERTENSION 06/14/2007  . ANEMIA-IRON DEFICIENCY 03/14/2007  . GERD 03/13/2007   Past Medical History:  Past Medical History  Diagnosis Date  . GERD (gastroesophageal reflux disease)   . Osteoporosis   . Postmenopausal HRT (hormone replacement therapy)   . Anemia   . Hypertension   . Allergy   . Asthma   . Shortness of breath   . PONV (postoperative nausea and vomiting)   . Dysrhythmia     a fib  . Esophageal cancer     removed esophagus stomach pulled up  . Basal cell carcinoma of face     "several; freezes them off"  . Pneumonia     "all the time; probably 5 times in the last year, counting today" (01/06/2014)  . Chronic bronchitis     "practically q winter" (01/06/2014)  . History of blood transfusion 1993    "related to esophagus removal" (01/06/2014)  . Headache(784.0)      "monthly in the past year" (01/06/2014)  . Arthritis     "legs" (01/06/2014)   Past Surgical History:  Past Surgical History  Procedure Laterality Date  . Left oophorectomy  1952  . Esophageal removal of cancer,gastric ppull-thru 1993    . Forearm fracture surgery Right ?1990's  . Esophagogastroduodenoscopy  10/19/2011    Procedure: ESOPHAGOGASTRODUODENOSCOPY (EGD);  Surgeon: Milus Banister, MD;  Location: [facility];  Service: Endoscopy;  Laterality: N/A;  . Balloon dilation  10/19/2011    Procedure: BALLOON DILATION;  Surgeon: Milus Banister, MD;  Location: [facility];  Service: Endoscopy;  Laterality: N/A;  . Esophagogastroduodenoscopy  11/07/2011    Procedure: ESOPHAGOGASTRODUODENOSCOPY (EGD);  Surgeon: Ladene Artist, MD,FACG;  Location[facility] END[facility]COPY;  Service: Endoscopy;  Laterality: N/A;  . Esophagogastroduodenoscopy  11/22/2011    Procedure: ESOPHAGOGASTRODUODENOSCOPY (EGD);  Surgeon: Inda Castle, MD;  Location: [facility];  Service: Endoscopy;  Laterality: N/A;  . Esophagogastroduodenoscopy  11/24/2011    Procedure: ESOPHAGOGASTRODUODENOSCOPY (EGD);  Surgeon: Inda Castle, MD;  Location: [facility];  Service: Endoscopy;  Laterality: N/A;  with removable duodenal stent (actually using esophageal partially covered 23X15 located in locked supply room  . Duodenal stent placement  11/24/2011    Procedure: DUODENAL STENT PLACEMENT;  Surgeon: Inda Castle, MD;  Location: [facility];  Service: Endoscopy;  Laterality: N/A;  . Video bronchoscopy Bilateral 01/18/2013    Procedure: VIDEO BRONCHOSCOPY WITHOUT FLUORO;  Surgeon: Tanda Rockers, MD;  Location: [facility] ENDOS[facility];  Service:  Cardiopulmonary;  Laterality: Bilateral;  . Esophagogastroduodenoscopy N/A 02/11/2013    Procedure: ESOPHAGOGASTRODUODENOSCOPY (EGD);  Surgeon: Inda Castle, MD;  Location: [facility];  Service: Endoscopy;  Laterality: N/A;  . Duodenal stent placement N/A 02/11/2013    Procedure: DUODENAL STENT  PLACEMENT;  Surgeon: Inda Castle, MD;  Location: [facility];  Service: Endoscopy;  Laterality: N/A;  . Esophagogastroduodenoscopy N/A 09/11/2013    Procedure: ESOPHAGOGASTRODUODENOSCOPY (EGD);  Surgeon: Gatha Mayer, MD;  Location: [facility];  Service: Endoscopy;  Laterality: N/A;  . Cholecystectomy  ~ 1996  . Dilation and curettage of uterus  X 6-7  . Cataract extraction w/ intraocular lens  implant, bilateral Bilateral ~ 2000   Social History:  reports that she has never smoked. She has never used smokeless tobacco. She reports that she does not drink alcohol or use illicit drugs.  Family / Support Systems Marital Status: Divorced How Long?: "in the 88's" Patient Roles: Other (Comment) (Has a niece and a great niece.) Children: (pt had one daughter who is now deceased) Other Supports: neice, [name] @ [phone] or [phone];  great-neice, [name] @ [phone] or [phone] Anticipated Caregiver: self Ability/Limitations of Caregiver: Niece checks on patient daily, calls daily Caregiver Availability: Other (Comment) (Family calls to check on patient.) Family Dynamics: pt refers to neice and great-neice as "...like daughters to me..."  Decribes them as very supportive and calling several times each day and visiting almost daily.  Social History Preferred language: English Religion: [employer] Cultural Background: NA Read: Yes Write: Yes Employment Status: Retired Freight forwarder Issues: none Guardian/Conservator: none - per MD, pt capable of making decisions on her own behalf   Abuse/Neglect Physical Abuse: Denies Verbal Abuse: Denies Sexual Abuse: Denies Exploitation of patient/patient's resources: Denies Self-Neglect: Denies  Emotional Status Pt's affect, behavior adn adjustment status: Pt very pleasant, talkative and motivated for therapies.  Very focused on regaining her strength and returning home independently.  Denies any emotional  distress. No s/s of depression or anxiety.  Will monitor. Recent Psychosocial Issues: Has had two prior episodes/ hospitalizations for pneumonia (each time d/c'd to [facility]) Pyschiatric History: None Substance Abuse History: None  Patient / Family Perceptions, Expectations & Goals Pt/Family understanding of illness & functional limitations: pt with good  understanding of her injuries suffered with the fall and of her current deconditioned state due to recurrent pna. Premorbid pt/family roles/activities: pt was independent at home, driving.  Has had private assistance a couple of days a week in the past and open to arranging this again if needed. Anticipated changes in roles/activities/participation: Pt may need to arrange private assistant again for home management assistance. Pt/family expectations/goals: "I just want to build my strength back up and get home"  [employer]: None Premorbid Home Care/DME Agencies: None Transportation available at discharge: yes  Discharge Planning Living Arrangements: [place]: Other relatives;Church/faith community;Friends/neighbors Type of Residence: Private residence Insurance Resources: Education officer, museum (specify) Financial Resources: Social Security Financial Screen Referred: No Living Expenses: Own Money Management: Patient Does the patient have any problems obtaining your medications?: No Home Management: pt - hired assistance prn Patient/Family Preliminary Plans: pt plans to return to her own home and will arrange private assistance as needed.  Neice to assist. Social Work Anticipated Follow Up Needs: HH/OP Expected length of stay: 7-10 days  Clinical Impression Very pleasant, motivated woman here following fall in bathtub, pna and now deconditioned.  Good support and family.  Pt hopes to reach a mod i level and be able to return home with only intermittent assist of family as before  admit.  Will follow for d/c planning assist.  No emotional distress noted.  [name] 01/13/2014, 9:36 AM

## 2014-01-13 NOTE — Progress Notes (Signed)
Physical Therapy Session Note  Patient Details  Name: [name] MRN: [id number] Date of Birth: 1928-08-24  Today's Date: 01/13/2014 Time: 1400-1455 Time Calculation (min): 55 min  Short Term Goals: Week 1:  PT Short Term Goal 1 (Week 1): STGs=LTGs due to anticipated LOS  Skilled Therapeutic Interventions/Progress Updates:  1:1. Pt received sitting in recliner, ready for therapy. Focus this session on strength, endurance and ROM. Pt utilized NuStep for B UE/LE strength/endurance, level 3x12 min w/ good tolerance, heat pad applied across shoulders to improve extensibility in neck musculature. Pt performed AROM/PROM exercises in sitting to improve B rotation, sideflexion, flextion/extension (10-20sec holds), scap retraction and R sidelying for gravity assist to stretch L side (limited tolerance). Pt seated on wedge to promote anterior pelvic tilt for improved posture.  Noted improved ROM and posture upon completion of exercises. Pt amb room<>therapy gym w/ RW and distant (S), no R toe catch noted today. Pt req cues for improved posture and head position during amb, but difficulty maintaining causing R lean in standing. Supervision for tolieting at end of session. Pt sitting in recliner at end of session w/ all needs in reach. Towel placed under L side of neck pillow to position head in midline.   Therapy Documentation Precautions:  Precautions Precautions: Fall Restrictions Weight Bearing Restrictions: No  See FIM for current functional status  Therapy/Group: Individual Therapy  Gilmore Laroche 01/13/2014, 2:56 PM

## 2014-01-13 NOTE — Progress Notes (Signed)
Occupational Therapy Session Note  Patient Details  Name: [name] MRN: [id number] Date of Birth: [birth year]  Today's Date: 01/13/2014  Session 1 Time: 1561-5379 Time Calculation (min): 56 min  Short Term Goals: Week 1:  OT Short Term Goal 1 (Week 1): STG=LTG due to short LOS  Skilled Therapeutic Interventions/Progress Updates:    Pt resting in recliner upon arrival and eager to wash up and get dressed.  Pt declined shower stating she had one previous day.  Pt wanted to stand at sink for sponge bath.  Pt completed bathing tasks with steady A/min A and performed dressing tasks with sit<>stand from recliner.  Pt requested to use toilet and amb with RW to bathroom.  Pt navigated cluttered environment safely and returned to recliner.  Pt required multiple rest breaks throughout session. Focus on activity tolerance, safety awareness, standing balance, and functional amb with RW.  Therapy Documentation Precautions:  Precautions Precautions: Fall Restrictions Weight Bearing Restrictions: No  Pain: Pain Assessment Pain Assessment: No/denies pain  See FIM for current functional status  Therapy/Group: Individual Therapy  Session 2 Time: 4327-6147 Pt denied pain Individual therapy  Pt engaged in functional amb with RW for home mgmt tasks and dynamic standing activities on compliant surface.  Pt also educated on RW safety and issued walker bag.  Focus on activity tolerance, dynamic standing balance, functional amb with RW, and safety awareness to increase independence with BADLs/IADLs.  Leotis Shames [facility] 01/13/2014, 8:56 AM

## 2014-01-14 ENCOUNTER — Inpatient Hospital Stay (HOSPITAL_COMMUNITY): Payer: Medicare Other | Admitting: Speech Pathology

## 2014-01-14 ENCOUNTER — Encounter (HOSPITAL_COMMUNITY): Payer: Medicare Other

## 2014-01-14 ENCOUNTER — Inpatient Hospital Stay (HOSPITAL_COMMUNITY): Payer: Medicare Other | Admitting: *Deleted

## 2014-01-14 ENCOUNTER — Inpatient Hospital Stay (HOSPITAL_COMMUNITY): Payer: Medicare Other

## 2014-01-14 LAB — GLUCOSE, CAPILLARY
GLUCOSE-CAPILLARY: 104 mg/dL — AB (ref 70–99)
Glucose-Capillary: 107 mg/dL — ABNORMAL HIGH (ref 70–99)
Glucose-Capillary: 112 mg/dL — ABNORMAL HIGH (ref 70–99)
Glucose-Capillary: 103 mg/dL — ABNORMAL HIGH (ref 70–99)

## 2014-01-14 LAB — URINALYSIS, ROUTINE W REFLEX MICROSCOPIC
BILIRUBIN URINE: NEGATIVE
Glucose, UA: NEGATIVE mg/dL
Hgb urine dipstick: NEGATIVE
KETONES UR: NEGATIVE mg/dL
Leukocytes, UA: NEGATIVE
Nitrite: NEGATIVE
Protein, ur: NEGATIVE mg/dL
SPECIFIC GRAVITY, URINE: 1.012 (ref 1.005–1.030)
UROBILINOGEN UA: 0.2 mg/dL (ref 0.0–1.0)
pH: 7 (ref 5.0–8.0)

## 2014-01-14 MED ORDER — SACCHAROMYCES BOULARDII 250 MG PO CAPS
250.0000 mg | ORAL_CAPSULE | Freq: Two times a day (BID) | ORAL | Status: DC
  Filled 2014-01-14 (×4): qty 1

## 2014-01-14 MED ORDER — SACCHAROMYCES BOULARDII 250 MG PO CAPS
500.0000 mg | ORAL_CAPSULE | Freq: Two times a day (BID) | ORAL | Status: DC
  Administered 2014-01-14 – 2014-01-18 (×8): 500 mg via ORAL
  Filled 2014-01-14 (×10): qty 2

## 2014-01-14 NOTE — Progress Notes (Signed)
Occupational Therapy Session Note  Patient Details  Name: [name] MRN: [id number] Date of Birth: 1928-11-13  Today's Date: 01/14/2014  Session 1 Time: 1000-1100 Time Calculation (min): 60 min  Short Term Goals: Week 1:  OT Short Term Goal 1 (Week 1): STG=LTG due to short LOS  Skilled Therapeutic Interventions/Progress Updates:    Pt engaged in bathing at shower level and dressing with sit<>stand from recliner.  Pt amb with RW to gather clothing and supplies transferring to shower.  Pt stood at sink to brush teeth at end of session.  Pt requested that she wear a brief secondary to incontinent bowel prior to session.  Pt stated that she shouldn't have had an Ensure earlier.  Pt exhibited no unsafe behaviors throughout session and asked for appropriate assistance throughout session.  Focus on activity tolerance, functional amb with RW, dynamic standing balance, and safety awareness.   Therapy Documentation Precautions:  Precautions Precautions: Fall Restrictions Weight Bearing Restrictions: No   Pain: Pain Assessment Pain Assessment: No/denies pain  See FIM for current functional status  Therapy/Group: Individual Therapy  Session 2 Time: 8144-8185 Pt denied pain Individual Therapy  Pt engaged in kitchen activity.  Pt prepared creamed spinach from frozen packaging.  Pt amb in kitchen without RW to gather item from freezer and correctly set microwave on appropriate setting.  Pt safely transferred food item to kitchen counter before opening up container.  Pt performed appropriate clean up tasks safely before amb with RW back to room.  Focus on activity tolerance, functional amb with and without RW, dynamic standing balance, and safety awareness.  Leotis Shames [facility] 01/14/2014, 11:03 AM

## 2014-01-14 NOTE — Progress Notes (Signed)
80 y.o. female with history of A fib, HTN, esophageal cancer s/p esophagectomy with gastric pull thorough, severe GERD, gastroparesis, recurrent aspiration PNA for past 6 months; who was found by her neighbor in her garden tub on 01/06/14. She reported having tripped and fallen in the tub about 12 hours prior to being found. Family reported recent cold type symptoms for the past few weeks with decrease in po intake as well as recent d/c of coumadin due to hematemesis. CT head without acute changes. CT cervical spine with mild spinal stenosis with cord flattening and dilated esophagus. She was started on IV antibiotics for LLL aspiration PNA and therapies ordered. Digoxin discontinued as family question A fib history. She develop A Fib with RVR 150-170 requiring IV metoprolol and amiodarone. 2D echo with EF 60-65%   Subjective/Complaints: Had a good day. Feels that her grip is still weak. Otherwise getting stronger     Objective: Vital Signs: Blood pressure 109/69, pulse 66, temperature 98.1 F (36.7 C), temperature source Oral, resp. rate 18, height 5' (1.524 m), weight 56.1 kg (123 lb 10.9 oz), SpO2 94.00%. Dg Swallowing Func-speech Pathology  01/13/2014   [name], [place]     01/13/2014  4:33 PM   Objective Swallowing Evaluation: Modified Barium Swallowing Study   Patient Details  Name: [name] MRN: [id number] Date of Birth: 12-Aug-1928  Today's Date: 01/13/2014 Time: 2423-5361 Time Calculation (min): 41 min  Past Medical History:  Past Medical History  Diagnosis Date  . GERD (gastroesophageal reflux disease)   . Osteoporosis   . Postmenopausal HRT (hormone replacement therapy)   . Anemia   . Hypertension   . Allergy   . Asthma   . Shortness of breath   . PONV (postoperative nausea and vomiting)   . Dysrhythmia     a fib  . Esophageal cancer     removed esophagus stomach pulled up  . Basal cell carcinoma of face     "several; freezes them off"  . Pneumonia     "all the time; probably 5  times in the last year, counting  today" (01/06/2014)  . Chronic bronchitis     "practically q winter" (01/06/2014)  . History of blood transfusion 1993    "related to esophagus removal" (01/06/2014)  . Headache(784.0)     "monthly in the past year" (01/06/2014)  . Arthritis     "legs" (01/06/2014)   Past Surgical History:  Past Surgical History  Procedure Laterality Date  . Left oophorectomy  1952  . Esophageal removal of cancer,gastric ppull-thru 1993    . Forearm fracture surgery Right ?1990's  . Esophagogastroduodenoscopy  10/19/2011    Procedure: ESOPHAGOGASTRODUODENOSCOPY (EGD);  Surgeon: Milus Banister, MD;  Location: [facility];  Service: Endoscopy;   Laterality: N/A;  . Balloon dilation  10/19/2011    Procedure: BALLOON DILATION;  Surgeon: Milus Banister, MD;   Location: [facility] ENDOSCOPY;  Service: Endoscopy;  Laterality: N/A;  . Esophagogastroduodenoscopy  11/07/2011    Procedure: ESOPHAGOGASTRODUODENOSCOPY (EGD);  Surgeon: Ladene Artist, MD,FACG;  Location: [facility] ENDOSCOPY;  Service: Endoscopy;   Laterality: N/A;  . Esophagogastroduodenoscopy  11/22/2011    Procedure: ESOPHAGOGASTRODUODENOSCOPY (EGD);  Surgeon: Inda Castle, MD;  Location: [facility];  Service: Endoscopy;   Laterality: N/A;  . Esophagogastroduodenoscopy  11/24/2011    Procedure: ESOPHAGOGASTRODUODENOSCOPY (EGD);  Surgeon: Inda Castle, MD;  Location: [facility];  Service: Endoscopy;   Laterality: N/A;  with removable duodenal stent (actually  using  esophageal partially covered 23X15 located in locked supply room  . Duodenal stent placement  11/24/2011    Procedure: DUODENAL STENT PLACEMENT;  Surgeon: Inda Castle,  MD;  Location: [facility];  Service: Endoscopy;  Laterality:  N/A;  . Video bronchoscopy Bilateral 01/18/2013    Procedure: VIDEO BRONCHOSCOPY WITHOUT FLUORO;  Surgeon: Tanda Rockers, MD;  Location: [facility];  Service: Cardiopulmonary;   Laterality: Bilateral;  . Esophagogastroduodenoscopy N/A 02/11/2013    Procedure:  ESOPHAGOGASTRODUODENOSCOPY (EGD);  Surgeon: Inda Castle, MD;  Location: [facility];  Service: Endoscopy;   Laterality: N/A;  . Duodenal stent placement N/A 02/11/2013    Procedure: DUODENAL STENT PLACEMENT;  Surgeon: Inda Castle,  MD;  Location: [facility];  Service: Endoscopy;  Laterality:  N/A;  . Esophagogastroduodenoscopy N/A 09/11/2013    Procedure: ESOPHAGOGASTRODUODENOSCOPY (EGD);  Surgeon: Gatha Mayer, MD;  Location: [facility];  Service: Endoscopy;   Laterality: N/A;  . Cholecystectomy  ~ 1996  . Dilation and curettage of uterus  X 6-7  . Cataract extraction w/ intraocular lens  implant, bilateral  Bilateral ~ 2000   HPI:  [name] is an 80 y.o. female with history of A fib,  HTN, esophageal cancer s/p esophagectomy with gastric pull  through, severe GERD, gastroparesis, recurrent aspiration PNA for  past 6 months; who was found by her neighbor in her garden tub on  01/06/14. Family reported recent cold type symptoms for the past  few weeks with decrease in po intake as well as recent d/c of  coumadin due to hematemesis. CT head without acute changes.  She  was started on IV antibiotics for LLL aspiration PNA and  therapies ordered. Has had neck tilting towards the left side  since her surgery for the esophageal cancer.       Recommendation/Prognosis  Clinical Impression:   Dysphagia Diagnosis: Moderate cervical esophageal phase  dysphagia Pt presents with an overall functional oropharyngeal swallowing  mechanism.  Deviation from normal oropharyngeal physiology  included a mild delay in swallow initiation to the level of the  vallecular which did not appear to impact the overall integrity  of the swallow.   Most remarkable on this exam was evidence of  post-surgical changes from esophagectomy in 1993.  Difficult to  ascertain baseline swallowing function based on today's objective  swallow study and no history of MBS is available in chart.   Pt  has what appears to be similar to  a CP bar at c5-c6 junction  resulting in moderate residue of both solids and liquids.   Residue was cleared to minimal-mild with multiple swallows and/or  thin liquid wash and did not impact the pt's swallowing safety,  therefore MD was not consulted at this time.  Given pt's  complicated and prolonged GI history, pt remains at an increased  risk of aspiration; however, pt is effectively protecting her  airway and no aspiration was visualized on this study.   Pt would  benefit from GI follow up as SLP suspects this is likely the  primary contributing factor to pt's recurrent aspiration  pneumonia versus pharyngeal strength and/or coordination.    Swallow Evaluation Recommendations:  Recommended Consults: Consider GI evaluation Diet Recommendations: Regular;Thin liquid Liquid Administration via: Cup;Straw Supervision: Full supervision/cueing for compensatory strategies  (RN reported on initial eval that family requests full  supervision during meals ) Compensations: Slow rate;Follow solids with liquid;Small  sips/bites Postural Changes and/or Swallow Maneuvers: Out of  bed for  meals;Seated upright 90 degrees;Upright 30-60 min after meal Oral Care Recommendations: Oral care BID Follow up Recommendations: None    Prognosis:  Prognosis for Safe Diet Advancement: Good Barriers to Reach Goals: Time post onset   Individuals Consulted: Consulted and Agree with Results and  Recommendations: Patient Report Sent to : Referring physician      SLP Assessment/Plan  Plan:   Please see treatment plan outlined in IPOC   Short Term Goals: Week 1: SLP Short Term Goal 1 (Week 1): Pt will  tolerate her currently prescribed diet with no overt s/s of  aspiration with modified independence.  SLP Short Term Goal 2 (Week 1): Pt will participate in an  objective swallow study to determine safest diet consistency.      General: Date of Onset: 01/06/14 Type of Study: Modified Barium Swallowing Study Reason for Referral: Objectively evaluate  swallowing function Previous Swallow Assessment: pt reports history of "swallow  tests" in the setting of esophagectomy, no MBS noted per chart  review Diet Prior to this Study: Regular;Thin liquids Temperature Spikes Noted: No Respiratory Status: Room air History of Recent Intubation: No Behavior/Cognition: Alert;Cooperative;Pleasant mood Oral Cavity - Dentition: Adequate natural dentition Oral Motor / Sensory Function: Within functional limits Self-Feeding Abilities: Able to feed self Patient Positioning: Upright in chair Baseline Vocal Quality: Clear Volitional Cough: Strong Anatomy: Other (Comment) (C2-C3 and C5-C7 boney prominences) Pharyngeal Secretions: Not observed secondary MBS   Reason for Referral:   Objectively evaluate swallowing function    Oral Phase: Oral Preparation/Oral Phase Oral Phase: WFL   Pharyngeal Phase:  Pharyngeal Phase Pharyngeal Phase: Within functional limits   Cervical Esophageal Phase  Cervical Esophageal Phase Cervical Esophageal Phase: Impaired Cervical Esophageal Phase - Thin Thin Cup: Prominent cricopharyngeal segment;Other (Comment)  (anterior protusion at the level of the cricopharyngeal segment) Thin Straw: Prominent cricopharyngeal segment;Other (Comment)  (anterior protusion at the level of the cricopharyngeal segment) Cervical Esophageal Phase - Solids Puree: Prominent cricopharyngeal segment;Other (Comment)  (anterior protusion at the level of the cricopharyngeal segment) Regular: Prominent cricopharyngeal segment;Other (Comment)  (anterior protusion at the level of the cricopharyngeal segment)   GN        Windell Moulding, M.A. CCC-SLP  [name], Erica Orion 01/13/2014, 10:30 AM                    Results for orders placed during the hospital encounter of 01/09/14 (from the past 72 hour(s))  GLUCOSE, CAPILLARY     Status: Abnormal   Collection Time    01/11/14 11:32 AM      Result Value Ref Range   Glucose-Capillary 108 (*) 70 - 99 mg/dL   Comment 1 Notify RN    GLUCOSE,  CAPILLARY     Status: Abnormal   Collection Time    01/11/14  4:26 PM      Result Value Ref Range   Glucose-Capillary 111 (*) 70 - 99 mg/dL   Comment 1 Notify RN    GLUCOSE, CAPILLARY     Status: Abnormal   Collection Time    01/11/14  8:15 PM      Result Value Ref Range   Glucose-Capillary 116 (*) 70 - 99 mg/dL  GLUCOSE, CAPILLARY     Status: None   Collection Time    01/12/14  7:32 AM      Result Value Ref Range   Glucose-Capillary 91  70 - 99 mg/dL   Comment 1 Notify RN    GLUCOSE, CAPILLARY  Status: None   Collection Time    01/12/14 11:42 AM      Result Value Ref Range   Glucose-Capillary 94  70 - 99 mg/dL   Comment 1 Notify RN    GLUCOSE, CAPILLARY     Status: Abnormal   Collection Time    01/12/14  4:45 PM      Result Value Ref Range   Glucose-Capillary 100 (*) 70 - 99 mg/dL  GLUCOSE, CAPILLARY     Status: Abnormal   Collection Time    01/12/14  9:18 PM      Result Value Ref Range   Glucose-Capillary 116 (*) 70 - 99 mg/dL  GLUCOSE, CAPILLARY     Status: None   Collection Time    01/13/14  7:23 AM      Result Value Ref Range   Glucose-Capillary 89  70 - 99 mg/dL   Comment 1 Notify RN    GLUCOSE, CAPILLARY     Status: Abnormal   Collection Time    01/13/14 11:18 AM      Result Value Ref Range   Glucose-Capillary 131 (*) 70 - 99 mg/dL   Comment 1 Notify RN    GLUCOSE, CAPILLARY     Status: Abnormal   Collection Time    01/13/14  4:34 PM      Result Value Ref Range   Glucose-Capillary 116 (*) 70 - 99 mg/dL  GLUCOSE, CAPILLARY     Status: Abnormal   Collection Time    01/13/14  9:12 PM      Result Value Ref Range   Glucose-Capillary 135 (*) 70 - 99 mg/dL  GLUCOSE, CAPILLARY     Status: Abnormal   Collection Time    01/14/14  7:12 AM      Result Value Ref Range   Glucose-Capillary 107 (*) 70 - 99 mg/dL      Head: Normocephalic and atraumatic.  Eyes: Conjunctivae are normal. Pupils are equal, round, and reactive to light.  Cardiovascular: An  irregularly irregular rhythm present.  Respiratory: Effort normal. No respiratory distress. She has decreased breath sounds in the left lower field. She has no rhonchi  Has paroxsymal episodic productive coughing spells during conversation.  GI: Soft. Bowel sounds are normal. She exhibits no distension. There is no tenderness.  Musculoskeletal: She exhibits edema (1+ pitting edema BLE tibially).  Resting tremor RUE (tardive dyskinesia)  Neurological: She is alert and oriented to person, place, and time.  Pleasant and appropriate. Follows commands without difficulty.  Skin: Skin is warm and dry.  Large bruise upper mid thoracic spine.  Psychiatric: She has a normal mood and affect. Her behavior is normal. Thought content normal.  Motor strength is 4/5 bilateral deltoid, bicep, tricep, grip is 3+ to 4-, hip flexor, knee extensor, ankle dorsiflexor and plantar flexor. Tremors BUE, R>L. No sensory loss.   Assessment/Plan: 1. Functional deficits secondary to deconditioning after pneumonia, and multiple medical issues. Recent fall with trunk,soft tissue trauma  which require 3+ hours per day of interdisciplinary therapy in a comprehensive inpatient rehab setting. Physiatrist is providing close team supervision and 24 hour management of active medical problems listed below. Physiatrist and rehab team continue to assess barriers to discharge/monitor patient progress toward functional and medical goals.   FIM: FIM - Bathing Bathing Steps Patient Completed: Chest;Right Arm;Left Arm;Abdomen;Front perineal area;Right upper leg;Left upper leg;Right lower leg (including foot);Left lower leg (including foot);Buttocks Bathing: 4: Steadying assist  FIM - Upper Body Dressing/Undressing Upper body dressing/undressing steps  patient completed: Thread/unthread right sleeve of pullover shirt/dresss;Thread/unthread left sleeve of pullover shirt/dress;Put head through opening of pull over  shirt/dress;Thread/unthread right bra strap;Thread/unthread left bra strap;Hook/unhook bra Upper body dressing/undressing: 4: Min-Patient completed 75 plus % of tasks FIM - Lower Body Dressing/Undressing Lower body dressing/undressing steps patient completed: Thread/unthread right pants leg;Thread/unthread left pants leg;Don/Doff right sock;Don/Doff left sock;Don/Doff left shoe;Don/Doff right shoe;Thread/unthread right underwear leg;Thread/unthread left underwear leg;Pull underwear up/down Lower body dressing/undressing: 4: Min-Patient completed 75 plus % of tasks  FIM - Toileting Toileting steps completed by patient: Adjust clothing prior to toileting;Performs perineal hygiene;Adjust clothing after toileting Toileting: 5: Supervision: Safety issues/verbal cues  FIM - Air cabin crew Transfers Assistive Devices: Insurance account manager Transfers: 5-To toilet/BSC: Supervision (verbal cues/safety issues);5-From toilet/BSC: Supervision (verbal cues/safety issues)  FIM - Control and instrumentation engineer Devices: Walker;Arm rests Bed/Chair Transfer: 5: Chair or W/C > Bed: Supervision (verbal cues/safety issues);5: Bed > Chair or W/C: Supervision (verbal cues/safety issues)  FIM - Locomotion: Wheelchair Locomotion: Wheelchair: 0: Activity did not occur FIM - Locomotion: Ambulation Locomotion: Ambulation Assistive Devices: Administrator Ambulation/Gait Assistance: 5: Supervision Locomotion: Ambulation: 5: Travels 150 ft or more with supervision/safety issues  Comprehension Comprehension Mode: Auditory Comprehension: 6-Follows complex conversation/direction: With extra time/assistive device  Expression Expression Mode: Verbal Expression: 6-Expresses complex ideas: With extra time/assistive device  Social Interaction Social Interaction: 7-Interacts appropriately with others - No medications needed.  Problem Solving Problem Solving: 5-Solves complex 90% of the time/cues <  10% of the time  Memory Memory: 5-Recognizes or recalls 90% of the time/requires cueing < 10% of the time  Medical Problem List and Plan:  1. Functional deficits secondary to deconditioning after pneumonia, and multiple medical issues. Recent fall with trunk,soft tissue trauma   -relative weakness of grip. Don't see any other focal neuro signs. CT of neck negative for acute injury 2. DVT Prophylaxis/Anticoagulation: Mechanical: Antiembolism stockings, knee (TED hose) Bilateral lower extremities  Sequential compression devices, below knee Bilateral lower extremities  3. Pain Management: Will continue tylenol tid schedule with tramadol prn for moderate to severe pain.  4. Mood: Has a positive outlook. Will have LCSW follow for evaluation and support.  5. Neuropsych: This patient is capable of making decisions on her own behalf.  6. Recurrent Aspiration PNA/E coli UTI: Narrow antibiotics to rocephin as sensitivities available.   7. Leucocytosis:  resolved   8. A fib: Now on amiodarone as well as digoxin for rate control.  9. Chronic cough: will schedule Tessalon perles. Use tussionex bid prn severe cough.  10 Esophageal cancer s/p resection with severe GERD/duodenal stent: Continue home regimen of PPI bid, Carafate and Reglan.  11. Diarrhea: stool consistency better 12. Chronic bronchitis with asthma: Continue nebs bid.  continue Mucinex DM as well as IS while awake.  13.  Mild hypoK likely related to loose stools , supplemented  LOS (Days) 5 A FACE TO FACE EVALUATION WAS PERFORMED  SWARTZ,ZACHARY T 01/14/2014, 8:48 AM

## 2014-01-14 NOTE — Progress Notes (Signed)
Speech Language Pathology Discharge Summary  Patient Details  Name: [name] MRN: [id number] Date of Birth: 1929/05/18  Today's Date: 01/14/2014 Time: 1135-1200 Time Calculation (min): 25 min  Skilled Therapeutic Interventions:  Pt was seen for skilled speech therapy targeting dysphagia management.  Pt was observed with presentations of her prescribed diet with no overt s/s of aspiration.  Furthermore, pt was modified independent for use of swallowing precautions (upright for meals, small bites/sips, alternate solids and liquids).  SLP reviewed and reinforced recommendations of yesterday's MBS including normal and abnormal swallowing anatomy and physiology and overt s/s of aspiration.  All pt's questions were answered to her satisfaction at this time.  Therefore, no further speech services are indicated.    Patient has met 3 of 3 long term goals.  Patient to discharge at overall Modified Independent level.  Reasons goals not met: n/a   Clinical Impression/Discharge Summary:   Patient has made functional gains and has met 3 of 3 long term goals while inpatient due to continued diet tolerance and unremarkable results from [facility] completed while inpatient.  Pt would benefit from GI follow up as an outpatient to objectively assess risk of aspiration related to post-surgical changes in the setting of esophagectomy from 1993 as she currently presents with grossly intact oropharyngeal swallowing function with no aspiration visualized on MBS.  Currently, patient is modified independent for use of swallowing precautions during meals and is cognitively intact. Patient education is complete. No further ST needs indicated at this time.   Care Partner:  Caregiver Able to Provide Assistance: Other (comment) (available PRN/intermittently)  Type of Caregiver Assistance: Cognitive;Physical  Recommendation:  None    Equipment: none recommended by SLP   Reasons for discharge: Treatment goals met    Patient/Family Agrees with Progress Made and Goals Achieved: Yes   See FIM for current functional status  Windell Moulding, M.A. CCC-SLP   Page, Selinda Orion 01/14/2014, 5:01 PM

## 2014-01-14 NOTE — Progress Notes (Signed)
Physical Therapy Session Note  Patient Details  Name: [name] MRN: [id number] Date of Birth: 03/27/1929  Today's Date: 01/14/2014 Time: 0805-0900 Time Calculation (min): 55 min  Short Term Goals: Week 1:  PT Short Term Goal 1 (Week 1): STGs=LTGs due to anticipated LOS  Skilled Therapeutic Interventions/Progress Updates:  1:1. Pt received sitting in recliner, ready for therapy. After amb to gym, pt found to be incontinent of urine. Pt stated that it happened earlier in AM, however, did not tell therapist or nursing staff. Amb back to room w/ mod A for clean up. Focus remainder of session on posture, neck ROM and standing exercises. Heat pad applied to shouders during AROM/PROM stretching to neck w/ manual facilitation for improved alignment during exercises. Therapist seated behind pt on therapy ball for improved trunk posture during exercises. Pt performed standing therex to target B LE strength/endurance, exercises included 2x10 reps: mini squats, marching and toe raises w/ goal of maintaining good head posture. Mirror used throughout session for visual feed back regarding posture. Pt req consistent cues to maintain head posture during exercises and ambulation, trying to look at feet. Pt req overall close(S) for amb room<>therapy gym w/ RW x2 this session. Pt experienced incontinence of bowel in route back to room, req A for clean up. Once cleaned up and washing hands at sink, pt experienced second bout of bowel incontinence. Nurse tech in room at end of session to assist pt w/ clean up.   Therapy Documentation Precautions:  Precautions Precautions: Fall Restrictions Weight Bearing Restrictions: No  See FIM for current functional status  Therapy/Group: Individual Therapy  Gilmore Laroche 01/14/2014, 12:16 PM

## 2014-01-15 ENCOUNTER — Inpatient Hospital Stay (HOSPITAL_COMMUNITY): Payer: Medicare Other

## 2014-01-15 ENCOUNTER — Encounter (HOSPITAL_COMMUNITY): Payer: Medicare Other

## 2014-01-15 DIAGNOSIS — R5381 Other malaise: Secondary | ICD-10-CM

## 2014-01-15 LAB — GLUCOSE, CAPILLARY
GLUCOSE-CAPILLARY: 97 mg/dL (ref 70–99)
GLUCOSE-CAPILLARY: 105 mg/dL — AB (ref 70–99)
GLUCOSE-CAPILLARY: 104 mg/dL — AB (ref 70–99)
Glucose-Capillary: 105 mg/dL — ABNORMAL HIGH (ref 70–99)

## 2014-01-15 NOTE — Progress Notes (Signed)
80 y.o. female with history of A fib, HTN, esophageal cancer s/p esophagectomy with gastric pull thorough, severe GERD, gastroparesis, recurrent aspiration PNA for past 6 months; who was found by her neighbor in her garden tub on 01/06/14. She reported having tripped and fallen in the tub about 12 hours prior to being found. Family reported recent cold type symptoms for the past few weeks with decrease in po intake as well as recent d/c of coumadin due to hematemesis. CT head without acute changes. CT cervical spine with mild spinal stenosis with cord flattening and dilated esophagus. She was started on IV antibiotics for LLL aspiration PNA and therapies ordered. Digoxin discontinued as family question A fib history. She develop A Fib with RVR 150-170 requiring IV metoprolol and amiodarone. 2D echo with EF 60-65%   Subjective/Complaints: Complains of occasional headache. Has light headedness while standing up as well. No bowel incontinence since yesterday morning     Objective: Vital Signs: Blood pressure 102/63, pulse 61, temperature 98.1 F (36.7 C), temperature source Oral, resp. rate 17, height 5' (1.524 m), weight 56.1 kg (123 lb 10.9 oz), SpO2 95.00%. Dg Swallowing Func-speech Pathology  01/13/2014   [name] Page, [place]     01/13/2014  4:33 PM   Objective Swallowing Evaluation: Modified Barium Swallowing Study   Patient Details  Name: [name] MRN: [id number] Date of Birth: 02/14/1929  Today's Date: 01/13/2014 Time: 3235-5732 Time Calculation (min): 41 min  Past Medical History:  Past Medical History  Diagnosis Date  . GERD (gastroesophageal reflux disease)   . Osteoporosis   . Postmenopausal HRT (hormone replacement therapy)   . Anemia   . Hypertension   . Allergy   . Asthma   . Shortness of breath   . PONV (postoperative nausea and vomiting)   . Dysrhythmia     a fib  . Esophageal cancer     removed esophagus stomach pulled up  . Basal cell carcinoma of face     "several; freezes them  off"  . Pneumonia     "all the time; probably 5 times in the last year, counting  today" (01/06/2014)  . Chronic bronchitis     "practically q winter" (01/06/2014)  . History of blood transfusion 1993    "related to esophagus removal" (01/06/2014)  . Headache(784.0)     "monthly in the past year" (01/06/2014)  . Arthritis     "legs" (01/06/2014)   Past Surgical History:  Past Surgical History  Procedure Laterality Date  . Left oophorectomy  1952  . Esophageal removal of cancer,gastric ppull-thru 1993    . Forearm fracture surgery Right ?1990's  . Esophagogastroduodenoscopy  10/19/2011    Procedure: ESOPHAGOGASTRODUODENOSCOPY (EGD);  Surgeon: Milus Banister, MD;  Location: [facility];  Service: Endoscopy;   Laterality: N/A;  . Balloon dilation  10/19/2011    Procedure: BALLOON DILATION;  Surgeon: Milus Banister, MD;   Location: [facility];  Service: Endoscopy;  Laterality: N/A;  . Esophagogastroduodenoscopy  11/07/2011    Procedure: ESOPHAGOGASTRODUODENOSCOPY (EGD);  Surgeon: Ladene Artist, MD,FACG;  Location: [facility] ENDOSCOPY;  Service: Endoscopy;   Laterality: N/A;  . Esophagogastroduodenoscopy  11/22/2011    Procedure: ESOPHAGOGASTRODUODENOSCOPY (EGD);  Surgeon: Inda Castle, MD;  Location: [facility];  Service: Endoscopy;   Laterality: N/A;  . Esophagogastroduodenoscopy  11/24/2011    Procedure: ESOPHAGOGASTRODUODENOSCOPY (EGD);  Surgeon: Inda Castle, MD;  Location: [facility];  Service: Endoscopy;   Laterality: N/A;  with  removable duodenal stent (actually using  esophageal partially covered 23X15 located in locked supply room  . Duodenal stent placement  11/24/2011    Procedure: DUODENAL STENT PLACEMENT;  Surgeon: Inda Castle,  MD;  Location: [facility];  Service: Endoscopy;  Laterality:  N/A;  . Video bronchoscopy Bilateral 01/18/2013    Procedure: VIDEO BRONCHOSCOPY WITHOUT FLUORO;  Surgeon: Tanda Rockers, MD;  Location: [facility]OS[facility];  Service: Cardiopulmonary;   Laterality: Bilateral;  .  Esophagogastroduodenoscopy N/A 02/11/2013    Procedure: ESOPHAGOGASTRODUODENOSCOPY (EGD);  Surgeon: Inda Castle, MD;  Location: [facility];  Service: Endoscopy;   Laterality: N/A;  . Duodenal stent placement N/A 02/11/2013    Procedure: DUODENAL STENT PLACEMENT;  Surgeon: Inda Castle,  MD;  Location: [facility];  Service: Endoscopy;  Laterality:  N/A;  . Esophagogastroduodenoscopy N/A 09/11/2013    Procedure: ESOPHAGOGASTRODUODENOSCOPY (EGD);  Surgeon: Gatha Mayer, MD;  Location: [facility];  Service: Endoscopy;   Laterality: N/A;  . Cholecystectomy  ~ 1996  . Dilation and curettage of uterus  X 6-7  . Cataract extraction w/ intraocular lens  implant, bilateral  Bilateral ~ 2000   HPI:  [name] is an 80 y.o. female with history of A fib,  HTN, esophageal cancer s/p esophagectomy with gastric pull  through, severe GERD, gastroparesis, recurrent aspiration PNA for  past 6 months; who was found by her neighbor in her garden tub on  01/06/14. Family reported recent cold type symptoms for the past  few weeks with decrease in po intake as well as recent d/c of  coumadin due to hematemesis. CT head without acute changes.  She  was started on IV antibiotics for LLL aspiration PNA and  therapies ordered. Has had neck tilting towards the left side  since her surgery for the esophageal cancer.       Recommendation/Prognosis  Clinical Impression:   Dysphagia Diagnosis: Moderate cervical esophageal phase  dysphagia Pt presents with an overall functional oropharyngeal swallowing  mechanism.  Deviation from normal oropharyngeal physiology  included a mild delay in swallow initiation to the level of the  vallecular which did not appear to impact the overall integrity  of the swallow.   Most remarkable on this exam was evidence of  post-surgical changes from esophagectomy in 1993.  Difficult to  ascertain baseline swallowing function based on today's objective  swallow study and no history of MBS is  available in chart.   Pt  has what appears to be similar to a CP bar at c5-c6 junction  resulting in moderate residue of both solids and liquids.   Residue was cleared to minimal-mild with multiple swallows and/or  thin liquid wash and did not impact the pt's swallowing safety,  therefore MD was not consulted at this time.  Given pt's  complicated and prolonged GI history, pt remains at an increased  risk of aspiration; however, pt is effectively protecting her  airway and no aspiration was visualized on this study.   Pt would  benefit from GI follow up as SLP suspects this is likely the  primary contributing factor to pt's recurrent aspiration  pneumonia versus pharyngeal strength and/or coordination.    Swallow Evaluation Recommendations:  Recommended Consults: Consider GI evaluation Diet Recommendations: Regular;Thin liquid Liquid Administration via: Cup;Straw Supervision: Full supervision/cueing for compensatory strategies  (RN reported on initial eval that family requests full  supervision during meals ) Compensations: Slow rate;Follow solids with liquid;Small  sips/bites Postural Changes and/or  Swallow Maneuvers: Out of bed for  meals;Seated upright 90 degrees;Upright 30-60 min after meal Oral Care Recommendations: Oral care BID Follow up Recommendations: None    Prognosis:  Prognosis for Safe Diet Advancement: Good Barriers to Reach Goals: Time post onset   Individuals Consulted: Consulted and Agree with Results and  Recommendations: Patient Report Sent to : Referring physician      SLP Assessment/Plan  Plan:   Please see treatment plan outlined in IPOC   Short Term Goals: Week 1: SLP Short Term Goal 1 (Week 1): Pt will  tolerate her currently prescribed diet with no overt s/s of  aspiration with modified independence.  SLP Short Term Goal 2 (Week 1): Pt will participate in an  objective swallow study to determine safest diet consistency.      General: Date of Onset: 01/06/14 Type of Study: Modified Barium  Swallowing Study Reason for Referral: Objectively evaluate swallowing function Previous Swallow Assessment: pt reports history of "swallow  tests" in the setting of esophagectomy, no MBS noted per chart  review Diet Prior to this Study: Regular;Thin liquids Temperature Spikes Noted: No Respiratory Status: Room air History of Recent Intubation: No Behavior/Cognition: Alert;Cooperative;Pleasant mood Oral Cavity - Dentition: Adequate natural dentition Oral Motor / Sensory Function: Within functional limits Self-Feeding Abilities: Able to feed self Patient Positioning: Upright in chair Baseline Vocal Quality: Clear Volitional Cough: Strong Anatomy: Other (Comment) (C2-C3 and C5-C7 boney prominences) Pharyngeal Secretions: Not observed secondary MBS   Reason for Referral:   Objectively evaluate swallowing function    Oral Phase: Oral Preparation/Oral Phase Oral Phase: WFL   Pharyngeal Phase:  Pharyngeal Phase Pharyngeal Phase: Within functional limits   Cervical Esophageal Phase  Cervical Esophageal Phase Cervical Esophageal Phase: Impaired Cervical Esophageal Phase - Thin Thin Cup: Prominent cricopharyngeal segment;Other (Comment)  (anterior protusion at the level of the cricopharyngeal segment) Thin Straw: Prominent cricopharyngeal segment;Other (Comment)  (anterior protusion at the level of the cricopharyngeal segment) Cervical Esophageal Phase - Solids Puree: Prominent cricopharyngeal segment;Other (Comment)  (anterior protusion at the level of the cricopharyngeal segment) Regular: Prominent cricopharyngeal segment;Other (Comment)  (anterior protusion at the level of the cricopharyngeal segment)   GN        Windell Moulding, M.A. CCC-SLP  Page, [name] 01/13/2014, 10:30 AM                    Results for orders placed during the hospital encounter of 01/09/14 (from the past 72 hour(s))  GLUCOSE, CAPILLARY     Status: None   Collection Time    01/12/14 11:42 AM      Result Value Ref Range   Glucose-Capillary 94  70 -  99 mg/dL   Comment 1 Notify RN    GLUCOSE, CAPILLARY     Status: Abnormal   Collection Time    01/12/14  4:45 PM      Result Value Ref Range   Glucose-Capillary 100 (*) 70 - 99 mg/dL  GLUCOSE, CAPILLARY     Status: Abnormal   Collection Time    01/12/14  9:18 PM      Result Value Ref Range   Glucose-Capillary 116 (*) 70 - 99 mg/dL  GLUCOSE, CAPILLARY     Status: None   Collection Time    01/13/14  7:23 AM      Result Value Ref Range   Glucose-Capillary 89  70 - 99 mg/dL   Comment 1 Notify RN    GLUCOSE, CAPILLARY  Status: Abnormal   Collection Time    01/13/14 11:18 AM      Result Value Ref Range   Glucose-Capillary 131 (*) 70 - 99 mg/dL   Comment 1 Notify RN    GLUCOSE, CAPILLARY     Status: Abnormal   Collection Time    01/13/14  4:34 PM      Result Value Ref Range   Glucose-Capillary 116 (*) 70 - 99 mg/dL  GLUCOSE, CAPILLARY     Status: Abnormal   Collection Time    01/13/14  9:12 PM      Result Value Ref Range   Glucose-Capillary 135 (*) 70 - 99 mg/dL  GLUCOSE, CAPILLARY     Status: Abnormal   Collection Time    01/14/14  7:12 AM      Result Value Ref Range   Glucose-Capillary 107 (*) 70 - 99 mg/dL  GLUCOSE, CAPILLARY     Status: Abnormal   Collection Time    01/14/14 11:18 AM      Result Value Ref Range   Glucose-Capillary 112 (*) 70 - 99 mg/dL   Comment 1 Notify RN    GLUCOSE, CAPILLARY     Status: Abnormal   Collection Time    01/14/14  4:45 PM      Result Value Ref Range   Glucose-Capillary 103 (*) 70 - 99 mg/dL  GLUCOSE, CAPILLARY     Status: Abnormal   Collection Time    01/14/14 10:00 PM      Result Value Ref Range   Glucose-Capillary 104 (*) 70 - 99 mg/dL  URINALYSIS, ROUTINE W REFLEX MICROSCOPIC     Status: None   Collection Time    01/14/14 10:41 PM      Result Value Ref Range   Color, Urine YELLOW  YELLOW   APPearance CLEAR  CLEAR   Specific Gravity, Urine 1.012  1.005 - 1.030   pH 7.0  5.0 - 8.0   Glucose, UA NEGATIVE  NEGATIVE mg/dL    Hgb urine dipstick NEGATIVE  NEGATIVE   Bilirubin Urine NEGATIVE  NEGATIVE   Ketones, ur NEGATIVE  NEGATIVE mg/dL   Protein, ur NEGATIVE  NEGATIVE mg/dL   Urobilinogen, UA 0.2  0.0 - 1.0 mg/dL   Nitrite NEGATIVE  NEGATIVE   Leukocytes, UA NEGATIVE  NEGATIVE   Comment: MICROSCOPIC NOT DONE ON URINES WITH NEGATIVE PROTEIN, BLOOD, LEUKOCYTES, NITRITE, OR GLUCOSE <1000 mg/dL.  GLUCOSE, CAPILLARY     Status: None   Collection Time    01/15/14  7:17 AM      Result Value Ref Range   Glucose-Capillary 97  70 - 99 mg/dL   Comment 1 Notify RN        Head: Normocephalic and atraumatic.  Eyes: Conjunctivae are normal. Pupils are equal, round, and reactive to light.  Cardiovascular: An irregularly irregular rhythm present.  Respiratory: Effort normal. No respiratory distress. She has decreased breath sounds in the left lower field. She has no rhonchi  Has paroxsymal episodic productive coughing spells during conversation.  GI: Soft. Bowel sounds are normal. She exhibits no distension. There is no tenderness.  Musculoskeletal: She exhibits edema (1+ pitting edema BLE tibially).  Resting tremor RUE (tardive dyskinesia)  Neurological: She is alert and oriented to person, place, and time.  Pleasant and appropriate. Follows commands without difficulty.  Skin: Skin is warm and dry.  Large bruise upper mid thoracic spine.  Psychiatric: She has a normal mood and affect. Her behavior is normal. Thought content  normal.  Motor strength is 4/5 bilateral deltoid, bicep, tricep, grip is 3+ to 4-, hip flexor, knee extensor, ankle dorsiflexor and plantar flexor. Tremors BUE, R>L. No sensory loss.   Assessment/Plan: 1. Functional deficits secondary to deconditioning after pneumonia, and multiple medical issues. Recent fall with trunk,soft tissue trauma  which require 3+ hours per day of interdisciplinary therapy in a comprehensive inpatient rehab setting. Physiatrist is providing close team supervision and  24 hour management of active medical problems listed below. Physiatrist and rehab team continue to assess barriers to discharge/monitor patient progress toward functional and medical goals.   FIM: FIM - Bathing Bathing Steps Patient Completed: Chest;Right Arm;Left Arm;Abdomen;Front perineal area;Right upper leg;Left upper leg;Right lower leg (including foot);Left lower leg (including foot);Buttocks Bathing: 5: Set-up assist to: Adjust water temp  FIM - Upper Body Dressing/Undressing Upper body dressing/undressing steps patient completed: Thread/unthread right sleeve of pullover shirt/dresss;Thread/unthread left sleeve of pullover shirt/dress;Put head through opening of pull over shirt/dress;Thread/unthread right bra strap;Thread/unthread left bra strap Upper body dressing/undressing: 4: Min-Patient completed 75 plus % of tasks FIM - Lower Body Dressing/Undressing Lower body dressing/undressing steps patient completed: Thread/unthread right pants leg;Thread/unthread left pants leg;Don/Doff right sock;Don/Doff left sock;Don/Doff left shoe;Don/Doff right shoe Lower body dressing/undressing: 4: Min-Patient completed 75 plus % of tasks  FIM - Toileting Toileting steps completed by patient: Adjust clothing prior to toileting;Performs perineal hygiene;Adjust clothing after toileting Toileting: 5: Supervision: Safety issues/verbal cues  FIM - Air cabin crew Transfers Assistive Devices: Insurance account manager Transfers: 5-To toilet/BSC: Supervision (verbal cues/safety issues);5-From toilet/BSC: Supervision (verbal cues/safety issues)  FIM - Control and instrumentation engineer Devices: Walker;Arm rests Bed/Chair Transfer: 5: Chair or W/C > Bed: Supervision (verbal cues/safety issues);5: Bed > Chair or W/C: Supervision (verbal cues/safety issues)  FIM - Locomotion: Wheelchair Locomotion: Wheelchair: 0: Activity did not occur FIM - Locomotion: Ambulation Locomotion: Ambulation  Assistive Devices: Administrator Ambulation/Gait Assistance: 5: Supervision Locomotion: Ambulation: 5: Travels 150 ft or more with supervision/safety issues  Comprehension Comprehension Mode: Auditory Comprehension: 6-Follows complex conversation/direction: With extra time/assistive device  Expression Expression Mode: Verbal Expression: 6-Expresses complex ideas: With extra time/assistive device  Social Interaction Social Interaction: 7-Interacts appropriately with others - No medications needed.  Problem Solving Problem Solving: 5-Solves complex 90% of the time/cues < 10% of the time  Memory Memory: 5-Recognizes or recalls 90% of the time/requires cueing < 10% of the time  Medical Problem List and Plan:  1. Functional deficits secondary to deconditioning after pneumonia, and multiple medical issues. Recent fall with trunk,soft tissue trauma   -relative weakness of grip. Don't see any other focal neuro signs. CT of neck negative for acute injury 2. DVT Prophylaxis/Anticoagulation: Mechanical: Antiembolism stockings, knee (TED hose) Bilateral lower extremities  Sequential compression devices, below knee Bilateral lower extremities  3. Pain Management: Will continue tylenol tid schedule with tramadol prn for moderate to severe pain.  4. Mood: Has a positive outlook. Will have LCSW follow for evaluation and support.  5. Neuropsych: This patient is capable of making decisions on her own behalf.  6. Recurrent Aspiration PNA/E coli UTI: Narrow antibiotics to rocephin as sensitivities available.   7. Leucocytosis:  resolved   8. A fib: Now on amiodarone as well as digoxin for rate control  -?orthostasis---check ortho vs.   -recheck bmet tomorrow.  9. Chronic cough: will schedule Tessalon perles. Use tussionex bid prn severe cough.  10 Esophageal cancer s/p resection with severe GERD/duodenal stent: Continue home regimen of PPI bid, Carafate and Reglan (now).  11. Diarrhea: stopped  antibiotics, hold reglan for now 12. Chronic bronchitis with asthma: Continue nebs bid.  continue Mucinex DM as well as IS while awake.  13.  Mild hypoK likely related to loose stools , supplemented  LOS (Days) 6 A FACE TO FACE EVALUATION WAS PERFORMED  SWARTZ,ZACHARY T 01/15/2014, 7:57 AM

## 2014-01-15 NOTE — Progress Notes (Addendum)
Occupational Therapy Session Note  Patient Details  Name: [name] MRN: [id number] Date of Birth: 1928/08/17  Today's Date: 01/15/2014  Session 1 Time: 1025-1100 Time Calculation (min): 35 min  Short Term Goals: Week 1:  OT Short Term Goal 1 (Week 1): STG=LTG due to short LOS  Skilled Therapeutic Interventions/Progress Updates:    Pt engaged in bathing while standing at sink and dressing while standing and sit<>stand for pants.  Pt required assistance pulling up pants over Depends secondary to decreased grasp.  Pt also required assistance pulling shirt over trunk while standing at sink.  Pt completed grooming tasks while standing at sink.  Pt requested to use toilet during session and amb with RW to bathroom to complete toileting tasks.  Pt exhibited no unsafe behaviors during session.  Discussed with patient her difficulty with pulling up pants and pulling down shirt.  Pt stated that at home she would complete the tasks but often it might take an extended period of time ("more time that we have here at hospital"). Pt required assistance with donning thigh high Ted hose.  Pt stated that she had knee high compression hose at home that she wore when her feet were swollen.  Focus on activity tolerance, safety awareness, dynamic standing balance, functional amb with RW, and discharge planning.  Therapy Documentation Precautions:  Precautions Precautions: Fall Restrictions Weight Bearing Restrictions: No General: General Amount of Missed OT Time (min): 25 Minutes pt off unit for X-ray Pain: Pain Assessment Pain Assessment: No/denies pain Pain Score: 0-No pain Pain Location: Rib cage Pain Orientation: Left Pain Descriptors / Indicators: Sore Pain Intervention(s): Medication (See eMAR) (Scheduled tylenol)  See FIM for current functional status  Therapy/Group: Individual Therapy  Session 2 Pt denied pain Individual Therapy  Pt amb with RW to ADL apartment to practice  simulate walk in shower transfer with seat in shower.  Pt performed task safely without use of RW for transfer. Pt transitioned to therapy gym and engaged in BUE activities while seated unsupported and standing on compliant surface. Focus on grasping clothes pins with gradient resistance to increase grasp strength and enable patient to more efficiently pull up pants during BADLs. Pt amb to room to use toilet at supervision level.  Pt was able to pull up pants without assistance this afternoon.   [name] [facility] 01/15/2014, 11:30 AM

## 2014-01-15 NOTE — Progress Notes (Signed)
Physical Therapy Session Note  Patient Details  Name: [name] MRN: [id number] Date of Birth: 01/14/1929  Today's Date: 01/15/2014 Time: Treatment Session 1: 1415-1500; Treatment Session 2: 1530-1600 Time Calculation (min): Treatment Session 1: 45 min; Treatment Session 2: 53min  Short Term Goals: Week 1:  PT Short Term Goal 1 (Week 1): STGs=LTGs due to anticipated LOS  Skilled Therapeutic Interventions/Progress Updates:  Treatment Session 1:  1:1. Pt received sitting in recliner, ready for therapy. Pt encouraged to use bathroom prior to leaving room to prevent incontinence, min A for managing pants. Focus this session on functional endurance, community gait training, neck ROM and positioning in recliner. Pt amb 150'x3 on unit w/ RW and overall (S), cues for head posture. Pt amb 200-250'x3 in community environment w/ RW and close(S)-occasional min A when negotiating on/off elevators, in busy lobby, outside on uneven surfaces and up/down 6 steps x2 w/ use of single rail. Pt req cues to decrease speed for safety, posture and step-to pattern for safety due to heel catching w/ reciprocal pattern. Pt performed neck AROM exercises in sitting w/ therapist facilitating improved head/neck alignment. Towel rolls utilized for improved support/positioning in back and L side of neck while seated in recliner. Pt sitting in recliner at end of session w/ all needs in reach.  Pt w/ mild complaints of cramping around L knee during therapy session, RN made aware.    Treatment Session 2:  1:1. Pt received sitting in recliner, sleeping noted poor head positioning and easy to wake. Focus this session on strength/endurance, toileting and positioning in recliner. Pt req close(S) during amb room<>therapy gym w/ RW and cues to decrease speed for safety. Pt utilized NuStep for B UE/LE strength/endurance, level 3x56min w/ good tolerance. Encouraged pt to use bathroom at end of session to prevent incontinence, min A  for management of pants. Pt positioned in recliner at end of session w/ slightly increased back tilt for improved head support if asleep, all needs in reach. Friends in room visiting.   Therapy Documentation Precautions:  Precautions Precautions: Fall Restrictions Weight Bearing Restrictions: No General: Amount of Missed PT Time (min): 15 Minutes Missed Time Reason: Other (comment) (therapist running late)  See FIM for current functional status  Therapy/Group: Individual Therapy  Gilmore Laroche 01/15/2014, 3:45 PM

## 2014-01-15 NOTE — Patient Care Conference (Signed)
Inpatient RehabilitationTeam Conference and Plan of Care Update Date: 01/14/2014   Time: 3:10 PM    Patient Name: [name]      Medical Record Number: [id number]  Date of Birth: 1928-12-16 Sex: Female         Room/Bed: 4M09C/4M09C-01 Payor Info: Payor: MEDICARE / Plan: MEDICARE PART A AND B / Product Type: *No Product type* /    Admitting Diagnosis: Deconditioned   Admit Date/Time:  01/09/2014  6:27 PM Admission Comments: No comment available   Primary Diagnosis:  <principal problem not specified> Principal Problem: <principal problem not specified>  Patient Active Problem List   Diagnosis Date Noted  . Multifactorial gait disorder 01/09/2014  . Fall at home 01/06/2014  . Chronic diastolic CHF (congestive heart failure) 09/15/2013  . Pulmonary hypertension 09/15/2013  . Hematemesis 09/10/2013  . Acute blood loss anemia 09/10/2013  . CAP (community acquired pneumonia) 09/09/2013  . Sepsis 09/09/2013  . PNA (pneumonia) 09/09/2013  . A-fib 09/09/2013  . Unspecified asthma(493.90) 09/09/2013  . Encounter for therapeutic drug monitoring 09/06/2013  . Hx of esophagectomy 08/02/2013  . Anticoagulant long-term use 08/02/2013  . Aspiration pneumonia 07/26/2013  . Tremor 05/29/2013  . Edema 05/14/2013  . Aortic regurgitation 05/14/2013  . Hemoptysis 01/15/2013  . Atrial fibrillation with rapid ventricular response 11/23/2011  . Gastric outlet obstruction 11/05/2011  . Esophageal stricture 10/19/2011  . Asthma with bronchitis 11/24/2010  . HX OF ESOPHAGEAL CANCER 08/29/2008  . HYPERTENSION 06/14/2007  . ANEMIA-IRON DEFICIENCY 03/14/2007  . GERD 03/13/2007    Expected Discharge Date: Expected Discharge Date: 01/18/14  Team Members Present: Physician leading conference: Dr. Alger Simons Social Worker Present: Lennart Pall, LCSW Nurse Present: Elliot Cousin, RN PT Present: Melene Plan, Cottie Banda, PT OT Present: Roanna Epley, COTA;Jennifer Jolene Schimke, OT SLP Present: Weston Anna, SLP PPS Coordinator present : Daiva Nakayama, RN, CRRN;Becky Alwyn Ren, PT     Current Status/Progress Goal Weekly Team Focus  Medical   pain, deconditioning after fall and multiple medical issues  improve activity tolerance,   bowel mgt, HR control, nurition, swallowing   Bowel/Bladder   Pt. is incontinent bowel and bladder  Continent with timed toileting  Toilet every 2 hours when awake, at HS and x1 during night.   Swallow/Nutrition/ Hydration   mod I with regular solids, thin liquids    Mod I with least restrictive diet   d/c from Speech with recommendation for GI follow up   ADL's   supervision overall; decreased activity tolerance  mod I overall; supervision for simple meal prep  education, activity tolerance, IADLs, standing balance   Mobility   Overall (S)  Mod(I)-(S)  functional endurance, emergent awareness, posture and neck ROM, B LE strengthening, safety during functional mobility   Communication   grossly WFL   n/a  n/a   Safety/Cognition/ Behavioral Observations  grossly WFL   n/a  n/a   Pain   No c/o pain  <3  Monitor for non-verbal cues of pain   Skin   Skin tear to R wrist/elbow with minimal amount of serous drainage. Allevyn dressing intact. Abrasion to posterior neck, dressing cdi  No additional skin breakdown  Monitor for appropriate healing    Rehab Goals Patient on target to meet rehab goals: Yes *See Care Plan and progress notes for long and short-term goals.  Barriers to Discharge: safety, balance    Possible Resolutions to Barriers:  adaptive equipiment, NMR, safety training    Discharge Planning/Teaching Needs:  home  alone with only intermittent community level assist of family/ frineds      Team Discussion:  Has mod i goals overall and doing well except continues to have multiple, loose stools and bladder incont.  No  Aspiration per MBS.  MD to make med adjustments that should decrease b/b problems.  Plan  home end of week.  Per SW, family very concerned that we feel certain she will be safe to be home alone and at mod i level.    Revisions to Treatment Plan:  Medication changes to decrease b/b issues.   Continued Need for Acute Rehabilitation Level of Care: The patient requires daily medical management by a physician with specialized training in physical medicine and rehabilitation for the following conditions: Daily direction of a multidisciplinary physical rehabilitation program to ensure safe treatment while eliciting the highest outcome that is of practical value to the patient.: Yes Daily medical management of patient stability for increased activity during participation in an intensive rehabilitation regime.: Yes Daily analysis of laboratory values and/or radiology reports with any subsequent need for medication adjustment of medical intervention for : Other;Post surgical problems;Cardiac problems  HOYLE, LUCY 01/15/2014, 10:59 AM

## 2014-01-15 NOTE — Progress Notes (Signed)
Social Work Patient ID: [name], female   DOB: 1928-10-15, 80 y.o.   MRN: [id number]  Have reviewed team conference with pt and with niece, [name].  Both aware and agreeable with targeted d/c 6/20 at mod i goals.  Pt reports no b/b issues since yesterday.  Hopeful these are resolved.  Niece very pleased with gains pt has made and "the wonderful care all of you are providing to her!"  Continue to follow.  HOYLE, LUCY, LCSW

## 2014-01-15 NOTE — Progress Notes (Signed)
Social Work Patient ID: Vickey Sages, female   DOB: [birth year], 80 y.o.   MRN: [id number]  Lennart Pall, LCSW Social Worker Signed  Patient Care Conference Service date: 01/15/2014 7:10 AM  Inpatient RehabilitationTeam Conference and Plan of Care Update Date: 01/14/2014   Time: 3:10 PM     Patient Name: [name]       Medical Record Number: [id number]   Date of Birth: [birth year] Sex: Female         Room/Bed: 4M09C/4M09C-01 Payor Info: Payor: MEDICARE / Plan: MEDICARE PART A AND B / Product Type: *No Product type* /   Admitting Diagnosis: Deconditioned   Admit Date/Time:  01/09/2014  6:27 PM Admission Comments: No comment available   Primary Diagnosis:  <principal problem not specified> Principal Problem: <principal problem not specified>    Patient Active Problem List     Diagnosis  Date Noted   .  Multifactorial gait disorder  01/09/2014   .  Fall at home  01/06/2014   .  Chronic diastolic CHF (congestive heart failure)  09/15/2013   .  Pulmonary hypertension  09/15/2013   .  Hematemesis  09/10/2013   .  Acute blood loss anemia  09/10/2013   .  CAP (community acquired pneumonia)  09/09/2013   .  Sepsis  09/09/2013   .  PNA (pneumonia)  09/09/2013   .  A-fib  09/09/2013   .  Unspecified asthma(493.90)  09/09/2013   .  Encounter for therapeutic drug monitoring  09/06/2013   .  Hx of esophagectomy  08/02/2013   .  Anticoagulant long-term use  08/02/2013   .  Aspiration pneumonia  07/26/2013   .  Tremor  05/29/2013   .  Edema  05/14/2013   .  Aortic regurgitation  05/14/2013   .  Hemoptysis  01/15/2013   .  Atrial fibrillation with rapid ventricular response  11/23/2011   .  Gastric outlet obstruction  11/05/2011   .  Esophageal stricture  10/19/2011   .  Asthma with bronchitis  11/24/2010   .  HX OF ESOPHAGEAL CANCER  08/29/2008   .  HYPERTENSION  06/14/2007   .  ANEMIA-IRON DEFICIENCY  03/14/2007   .  GERD  03/13/2007     Expected Discharge Date: Expected  Discharge Date: 01/18/14  Team Members Present: Physician leading conference: Dr. Alger Simons Social Worker Present: Lennart Pall, LCSW Nurse Present: Elliot Cousin, RN PT Present: Melene Plan, Cottie Banda, PT OT Present: Roanna Epley, COTA;Jennifer Jolene Schimke, OT SLP Present: Weston Anna, SLP PPS Coordinator present : Daiva Nakayama, RN, CRRN;Becky Alwyn Ren, PT        Current Status/Progress  Goal  Weekly Team Focus   Medical     pain, deconditioning after fall and multiple medical issues  improve activity tolerance,   bowel mgt, HR control, nurition, swallowing   Bowel/Bladder     Pt. is incontinent bowel and bladder  Continent with timed toileting  Toilet every 2 hours when awake, at HS and x1 during night.   Swallow/Nutrition/ Hydration     mod I with regular solids, thin liquids    Mod I with least restrictive diet   d/c from Speech with recommendation for GI follow up   ADL's     supervision overall; decreased activity tolerance  mod I overall; supervision for simple meal prep  education, activity tolerance, IADLs, standing balance   Mobility     Overall (S)  Mod(I)-(S)  functional endurance, emergent  awareness, posture and neck ROM, B LE strengthening, safety during functional mobility   Communication     grossly WFL   n/a  n/a   Safety/Cognition/ Behavioral Observations    grossly WFL   n/a  n/a   Pain     No c/o pain  <3  Monitor for non-verbal cues of pain   Skin     Skin tear to R wrist/elbow with minimal amount of serous drainage. Allevyn dressing intact. Abrasion to posterior neck, dressing cdi  No additional skin breakdown  Monitor for appropriate healing    Rehab Goals Patient on target to meet rehab goals: Yes *See Care Plan and progress notes for long and short-term goals.    Barriers to Discharge:  safety, balance      Possible Resolutions to Barriers:    adaptive equipiment, NMR, safety training      Discharge Planning/Teaching  Needs:    home alone with only intermittent community level assist of family/ frineds      Team Discussion:    Has mod i goals overall and doing well except continues to have multiple, loose stools and bladder incont.  No  Aspiration per MBS.  MD to make med adjustments that should decrease b/b problems.  Plan home end of week.  Per SW, family very concerned that we feel certain she will be safe to be home alone and at mod i level.     Revisions to Treatment Plan:    Medication changes to decrease b/b issues.    Continued Need for Acute Rehabilitation Level of Care: The patient requires daily medical management by a physician with specialized training in physical medicine and rehabilitation for the following conditions: Daily direction of a multidisciplinary physical rehabilitation program to ensure safe treatment while eliciting the highest outcome that is of practical value to the patient.: Yes Daily medical management of patient stability for increased activity during participation in an intensive rehabilitation regime.: Yes Daily analysis of laboratory values and/or radiology reports with any subsequent need for medication adjustment of medical intervention for : Other;Post surgical problems;Cardiac problems  HOYLE, LUCY 01/15/2014, 10:59 AM

## 2014-01-16 ENCOUNTER — Inpatient Hospital Stay (HOSPITAL_COMMUNITY): Payer: Medicare Other

## 2014-01-16 ENCOUNTER — Inpatient Hospital Stay (HOSPITAL_COMMUNITY): Payer: Medicare Other | Admitting: Physical Therapy

## 2014-01-16 ENCOUNTER — Encounter (HOSPITAL_COMMUNITY): Payer: Medicare Other

## 2014-01-16 LAB — BASIC METABOLIC PANEL
BUN: 12 mg/dL (ref 6–23)
CO2: 33 meq/L — AB (ref 19–32)
Calcium: 9.9 mg/dL (ref 8.4–10.5)
Chloride: 97 mEq/L (ref 96–112)
Creatinine, Ser: 0.82 mg/dL (ref 0.50–1.10)
GFR calc Af Amer: 75 mL/min — ABNORMAL LOW (ref >90)
GFR, EST NON AFRICAN AMERICAN: 65 mL/min — AB (ref >90)
GLUCOSE: 92 mg/dL (ref 70–99)
Potassium: 4.4 mEq/L (ref 3.7–5.3)
SODIUM: 139 meq/L (ref 137–147)

## 2014-01-16 LAB — CBC
HEMATOCRIT: 35.4 % — AB (ref 36.0–46.0)
Hemoglobin: 11.1 g/dL — ABNORMAL LOW (ref 12.0–15.0)
MCH: 24.7 pg — ABNORMAL LOW (ref 26.0–34.0)
MCHC: 31.4 g/dL (ref 30.0–36.0)
MCV: 78.7 fL (ref 78.0–100.0)
Platelets: 355 10*3/uL (ref 150–400)
RBC: 4.5 MIL/uL (ref 3.87–5.11)
RDW: 16.4 % — AB (ref 11.5–15.5)
WBC: 11.3 10*3/uL — AB (ref 4.0–10.5)

## 2014-01-16 LAB — GLUCOSE, CAPILLARY
GLUCOSE-CAPILLARY: 98 mg/dL (ref 70–99)
GLUCOSE-CAPILLARY: 94 mg/dL (ref 70–99)

## 2014-01-16 MED ORDER — OXYBUTYNIN CHLORIDE 5 MG PO TABS
2.5000 mg | ORAL_TABLET | Freq: Two times a day (BID) | ORAL | Status: DC
  Administered 2014-01-16 – 2014-01-18 (×5): 2.5 mg via ORAL
  Filled 2014-01-16 (×8): qty 0.5

## 2014-01-16 NOTE — Plan of Care (Signed)
Problem: RH SKIN INTEGRITY Goal: RH STG ABLE TO PERFORM INCISION/WOUND CARE W/ASSISTANCE STG Able To Perform Incision/Wound Care With supervision Assistance.  Outcome: Progressing Reinforcement needed     Problem: RH KNOWLEDGE DEFICIT Goal: RH STG INCREASE KNOWLEDGE OF HYPERTENSION Pt will be able to check BP daily and PRN. Able to notify primary physician when BP is elevated or too low  Outcome: Progressing Reinforcement needed

## 2014-01-16 NOTE — Progress Notes (Signed)
Physical Therapy Session Note  Patient Details  Name: [name] MRN: [id number] Date of Birth: [birth year]  Today's Date: 01/16/2014 Time: 0810-0900 and [id number] Time Calculation (min): 50 min and 50 min  Short Term Goals: Week 1:  PT Short Term Goal 1 (Week 1): STGs=LTGs due to anticipated LOS  Skilled Therapeutic Interventions/Progress Updates:   Treatment 1: Session focused on functional endurance, community ambulation, cervical ROM, and positioning. Pt received sitting in recliner, requesting time to finish breakfast. Upon returning to room, pt performed UB/LB dressing with assist due to time constraints. Pt ambulated to/from bathroom with RW and S, with 1 posterior LOB when attempting to stand from toilet requiring min A for sit > stand from toilet. Gait training in controlled and community environment using RW 2 x > 500 ft on unit, negotiating on/off elevators, in mildly crowded hospital lobby, and outdoors on uneven surfaces including brick and concrete, inclines, declines with overall supervision and vc's for head positioning and foot clearance. Pt negotiated up/down 5 brick steps x 2 with 1 hand rail, supervision to min A for pt catching foot on step x 2. Pt negotiated up/down curb x 2 using RW with supervision and vc's for staying close to RW before lifting/lowering from curb. Pt reporting that she is frustrated she is not able to move as quickly this morning, patient education provided regarding appropriate gait speed for safety since patient typically requires verbal cues for decreased speed. Pt admitting that she is able to hold her head up better at slower pace, reinforced importance of slower speed for patient for improved safety and posture. Pt requesting pain medication due to 7/10 neck pain, see below, RN notified. Pt returned to room and sitting in recliner, performed cervical rotation and flex/ext AROM exercises with multimodal feedback from therapist for midline positioning.  Focus on patient's sitting posture and utilized towel rolls at low back and L neck for improved support and positioning while seated. Pt left sitting in recliner with all needs within reach.   Treatment 2: Pt received sitting in recliner, requesting to use bathroom. Pt reports 5/10 neck pain, premedicated. Pt ambulated to bathroom and sink using RW with S. Gait training room <> gym 150 ft x 2 using RW, supervision and vc's for posture. NuStep Level 3 x 14 min using BUE/LEs for overall endurance and strengthening. Pt performed floor transfer x 2 with min-mod A. Discussed safety with falls and when to call EMS vs attempt to get up on her own. Pt verbalized understanding, stating that she usually wears her medical alert button but had taken it off to charge when she suffered fall. Pt performed cervical AROM exercises in sitting, therapist facilitating improved head/neck positioning with mirror for visual feedback as well as soft tissue mobilization to L trap and cervical musculature. Pt reports improvement in pain to 3-4/10. Pt sitting in recliner at end of session, reviewed positioning utilizing towel rolls, all needs within reach.   Therapy Documentation Precautions:  Precautions Precautions: Fall Restrictions Weight Bearing Restrictions: No General: Amount of Missed PT Time (min): 10 Minutes Missed Time Reason: Other (comment) (pt eating breakfast) Pain: Pain Assessment Pain Assessment: 0-10 Pain Score: 7  Pain Type: Acute pain Pain Location: Neck Pain Orientation: Left Pain Descriptors / Indicators: Aching Pain Onset: On-going 2nd Pain Site Pain Intervention(s): Repositioned;Ambulation/increased activity;RN made aware  See FIM for current functional status  Therapy/Group: Individual Therapy  Laretta Alstrom 01/16/2014, 8:59 AM

## 2014-01-16 NOTE — Progress Notes (Signed)
80 y.o. female with history of A fib, HTN, esophageal cancer s/p esophagectomy with gastric pull thorough, severe GERD, gastroparesis, recurrent aspiration PNA for past 6 months; who was found by her neighbor in her garden tub on 01/06/14. She reported having tripped and fallen in the tub about 12 hours prior to being found. Family reported recent cold type symptoms for the past few weeks with decrease in po intake as well as recent d/c of coumadin due to hematemesis. CT head without acute changes. CT cervical spine with mild spinal stenosis with cord flattening and dilated esophagus. She was started on IV antibiotics for LLL aspiration PNA and therapies ordered. Digoxin discontinued as family question A fib history. She develop A Fib with RVR 150-170 requiring IV metoprolol and amiodarone. 2D echo with EF 60-65%   Subjective/Complaints: Complains of occasional headache still. Stools improved. Still with urinary frequency. Had some coughing last night which kept her up.     Objective: Vital Signs: Blood pressure 108/62, pulse 69, temperature 98 F (36.7 C), temperature source Oral, resp. rate 18, height 5' (1.524 m), weight 56.1 kg (123 lb 10.9 oz), SpO2 92.00%. Dg Chest 2 View  01/15/2014   CLINICAL DATA:  Pneumonia.  EXAM: CHEST  2 VIEW  COMPARISON:  January 10, 2014.  FINDINGS: Stable cardiomegaly. Stable left basilar density is noted consistent with postsurgical changes and atelectasis. No pneumothorax is noted. Right-sided PICC line is unchanged in position distal esophageal stent is again noted. No significant pleural effusion is noted. Increased curvilinear opacity is seen in the right upper lobe which may represent subsegmental atelectasis.  IMPRESSION: Stable left basilar density is noted consistent with atelectasis and postsurgical changes. New curvilinear density is noted in right upper lobe consistent with subsegmental atelectasis.   Electronically Signed   By: Sabino Dick M.D.   On: 01/15/2014  10:16   Results for orders placed during the hospital encounter of 01/09/14 (from the past 72 hour(s))  GLUCOSE, CAPILLARY     Status: Abnormal   Collection Time    01/13/14 11:18 AM      Result Value Ref Range   Glucose-Capillary 131 (*) 70 - 99 mg/dL   Comment 1 Notify RN    GLUCOSE, CAPILLARY     Status: Abnormal   Collection Time    01/13/14  4:34 PM      Result Value Ref Range   Glucose-Capillary 116 (*) 70 - 99 mg/dL  GLUCOSE, CAPILLARY     Status: Abnormal   Collection Time    01/13/14  9:12 PM      Result Value Ref Range   Glucose-Capillary 135 (*) 70 - 99 mg/dL  GLUCOSE, CAPILLARY     Status: Abnormal   Collection Time    01/14/14  7:12 AM      Result Value Ref Range   Glucose-Capillary 107 (*) 70 - 99 mg/dL  GLUCOSE, CAPILLARY     Status: Abnormal   Collection Time    01/14/14 11:18 AM      Result Value Ref Range   Glucose-Capillary 112 (*) 70 - 99 mg/dL   Comment 1 Notify RN    GLUCOSE, CAPILLARY     Status: Abnormal   Collection Time    01/14/14  4:45 PM      Result Value Ref Range   Glucose-Capillary 103 (*) 70 - 99 mg/dL  GLUCOSE, CAPILLARY     Status: Abnormal   Collection Time    01/14/14 10:00 PM  Result Value Ref Range   Glucose-Capillary 104 (*) 70 - 99 mg/dL  URINALYSIS, ROUTINE W REFLEX MICROSCOPIC     Status: None   Collection Time    01/14/14 10:41 PM      Result Value Ref Range   Color, Urine YELLOW  YELLOW   APPearance CLEAR  CLEAR   Specific Gravity, Urine 1.012  1.005 - 1.030   pH 7.0  5.0 - 8.0   Glucose, UA NEGATIVE  NEGATIVE mg/dL   Hgb urine dipstick NEGATIVE  NEGATIVE   Bilirubin Urine NEGATIVE  NEGATIVE   Ketones, ur NEGATIVE  NEGATIVE mg/dL   Protein, ur NEGATIVE  NEGATIVE mg/dL   Urobilinogen, UA 0.2  0.0 - 1.0 mg/dL   Nitrite NEGATIVE  NEGATIVE   Leukocytes, UA NEGATIVE  NEGATIVE   Comment: MICROSCOPIC NOT DONE ON URINES WITH NEGATIVE PROTEIN, BLOOD, LEUKOCYTES, NITRITE, OR GLUCOSE <1000 mg/dL.  GLUCOSE, CAPILLARY      Status: None   Collection Time    01/15/14  7:17 AM      Result Value Ref Range   Glucose-Capillary 97  70 - 99 mg/dL   Comment 1 Notify RN    GLUCOSE, CAPILLARY     Status: Abnormal   Collection Time    01/15/14 11:12 AM      Result Value Ref Range   Glucose-Capillary 105 (*) 70 - 99 mg/dL   Comment 1 Notify RN    GLUCOSE, CAPILLARY     Status: Abnormal   Collection Time    01/15/14  4:50 PM      Result Value Ref Range   Glucose-Capillary 105 (*) 70 - 99 mg/dL  GLUCOSE, CAPILLARY     Status: Abnormal   Collection Time    01/15/14  9:43 PM      Result Value Ref Range   Glucose-Capillary 104 (*) 70 - 99 mg/dL  CBC     Status: Abnormal   Collection Time    01/16/14  4:30 AM      Result Value Ref Range   WBC 11.3 (*) 4.0 - 10.5 K/uL   RBC 4.50  3.87 - 5.11 MIL/uL   Hemoglobin 11.1 (*) 12.0 - 15.0 g/dL   HCT 35.4 (*) 36.0 - 46.0 %   MCV 78.7  78.0 - 100.0 fL   MCH 24.7 (*) 26.0 - 34.0 pg   MCHC 31.4  30.0 - 36.0 g/dL   RDW 16.4 (*) 11.5 - 15.5 %   Platelets 355  150 - 400 K/uL  BASIC METABOLIC PANEL     Status: Abnormal   Collection Time    01/16/14  4:30 AM      Result Value Ref Range   Sodium 139  137 - 147 mEq/L   Potassium 4.4  3.7 - 5.3 mEq/L   Chloride 97  96 - 112 mEq/L   CO2 33 (*) 19 - 32 mEq/L   Glucose, Bld 92  70 - 99 mg/dL   BUN 12  6 - 23 mg/dL   Creatinine, Ser 0.82  0.50 - 1.10 mg/dL   Calcium 9.9  8.4 - 10.5 mg/dL   GFR calc non Af Amer 65 (*) >90 mL/min   GFR calc Af Amer 75 (*) >90 mL/min   Comment: (NOTE)     The eGFR has been calculated using the CKD EPI equation.     This calculation has not been validated in all clinical situations.     eGFR's persistently <90 mL/min signify possible Chronic  Kidney     Disease.  GLUCOSE, CAPILLARY     Status: None   Collection Time    01/16/14  7:47 AM      Result Value Ref Range   Glucose-Capillary 98  70 - 99 mg/dL   Comment 1 Notify RN        Head: Normocephalic and atraumatic.  Eyes: Conjunctivae  are normal. Pupils are equal, round, and reactive to light.  Cardiovascular: An irregularly irregular rhythm present.  Respiratory: Effort normal. No respiratory distress. She has decreased breath sounds in the left lower field. She has no rhonchi  Has paroxsymal episodic productive coughing spells during conversation.  GI: Soft. Bowel sounds are normal. She exhibits no distension. There is no tenderness.  Musculoskeletal: She exhibits edema (1+ pitting edema BLE tibially). Head forward, kyphotic posture. Resting tremor RUE (tardive dyskinesia)  Neurological: She is alert and oriented to person, place, and time.  Pleasant and appropriate. Follows commands without difficulty.  Skin: Skin is warm and dry.  Large bruise upper mid thoracic spine.  Psychiatric: She has a normal mood and affect. Her behavior is normal. Thought content normal.  Motor strength is 4/5 bilateral deltoid, bicep, tricep, grip is 3+ to 4-, hip flexor, knee extensor, ankle dorsiflexor and plantar flexor. Tremors BUE, R>L. No sensory loss.   Assessment/Plan: 1. Functional deficits secondary to deconditioning after pneumonia, and multiple medical issues. Recent fall with trunk,soft tissue trauma  which require 3+ hours per day of interdisciplinary therapy in a comprehensive inpatient rehab setting. Physiatrist is providing close team supervision and 24 hour management of active medical problems listed below. Physiatrist and rehab team continue to assess barriers to discharge/monitor patient progress toward functional and medical goals.   FIM: FIM - Bathing Bathing Steps Patient Completed: Chest;Right Arm;Left Arm;Abdomen;Front perineal area;Right upper leg;Left upper leg;Right lower leg (including foot);Left lower leg (including foot);Buttocks Bathing: 5: Set-up assist to: Adjust water temp  FIM - Upper Body Dressing/Undressing Upper body dressing/undressing steps patient completed: Thread/unthread right sleeve of  pullover shirt/dresss;Thread/unthread left sleeve of pullover shirt/dress;Put head through opening of pull over shirt/dress;Thread/unthread right bra strap;Thread/unthread left bra strap;Hook/unhook bra Upper body dressing/undressing: 4: Min-Patient completed 75 plus % of tasks FIM - Lower Body Dressing/Undressing Lower body dressing/undressing steps patient completed: Thread/unthread right pants leg;Thread/unthread left pants leg;Don/Doff right sock;Don/Doff left sock;Don/Doff left shoe;Don/Doff right shoe;Thread/unthread right underwear leg;Thread/unthread left underwear leg;Pull underwear up/down Lower body dressing/undressing: 4: Min-Patient completed 75 plus % of tasks  FIM - Toileting Toileting steps completed by patient: Adjust clothing prior to toileting;Performs perineal hygiene Toileting Assistive Devices: Grab bar or rail for support Toileting: 5: Supervision: Safety issues/verbal cues  FIM - Diplomatic Services operational officer Devices: Art gallery manager Transfers: 5-To toilet/BSC: Supervision (verbal cues/safety issues);5-From toilet/BSC: Supervision (verbal cues/safety issues)  FIM - Banker Devices: Walker;Arm rests Bed/Chair Transfer: 5: Chair or W/C > Bed: Supervision (verbal cues/safety issues);5: Bed > Chair or W/C: Supervision (verbal cues/safety issues)  FIM - Locomotion: Wheelchair Locomotion: Wheelchair: 0: Activity did not occur FIM - Locomotion: Ambulation Locomotion: Ambulation Assistive Devices: Designer, industrial/product Ambulation/Gait Assistance: 5: Supervision Locomotion: Ambulation: 5: Travels 150 ft or more with supervision/safety issues  Comprehension Comprehension Mode: Auditory Comprehension: 6-Follows complex conversation/direction: With extra time/assistive device  Expression Expression Mode: Verbal Expression: 6-Expresses complex ideas: With extra time/assistive device  Social Interaction Social Interaction:  7-Interacts appropriately with others - No medications needed.  Problem Solving Problem Solving: 5-Solves complex 90% of the time/cues <  10% of the time  Memory Memory: 5-Recognizes or recalls 90% of the time/requires cueing < 10% of the time  Medical Problem List and Plan:  1. Functional deficits secondary to deconditioning after pneumonia, and multiple medical issues. Recent fall with trunk,soft tissue trauma   -relative weakness of grip. Don't see any other focal neuro signs. CT of neck negative for acute injury 2. DVT Prophylaxis/Anticoagulation: Mechanical: Antiembolism stockings, knee (TED hose) Bilateral lower extremities  Sequential compression devices, below knee Bilateral lower extremities  3. Pain Management: Will continue tylenol tid schedule with tramadol prn for moderate to severe pain.  4. Mood: Has a positive outlook. Will have LCSW follow for evaluation and support.  5. Neuropsych: This patient is capable of making decisions on her own behalf.  6. Recurrent Aspiration PNA/E coli UTI: stopped rocephin given diarrhea.   7. Leucocytosis:  Overall improved---recheck once more tomorrow  8. A fib: Now on amiodarone as well as digoxin for rate control  -?orthostasis--- VS ok this am  -bmet ok (volume status adequate) 9. Chronic cough: will schedule Tessalon perles. Use tussionex bid prn severe cough.  10 Esophageal cancer s/p resection with severe GERD/duodenal stent: Continue home regimen of PPI bid, Carafate and Reglan (now).  11. Diarrhea: stopped antibiotics, hold reglan for now 12. Chronic bronchitis with asthma: Continue nebs bid.  continue Mucinex DM as well as IS while awake.   -cxr ok. No new findings 13.  Mild hypoK likely related to loose stools , supplemented 14. Urinary frequency--low pvr;s  -add LOW dose ditropan  -ucx pending.   LOS (Days) 7 A FACE TO FACE EVALUATION WAS PERFORMED  [name] T 01/16/2014, 8:05 AM

## 2014-01-16 NOTE — Progress Notes (Signed)
Occupational Therapy Session Note  Patient Details  Name: [name] MRN: [id number] Date of Birth: [birth year]  Today's Date: 01/16/2014  Session 1 Time: 7001-7494 Time Calculation (min): 24 min  Short Term Goals: Week 1:  OT Short Term Goal 1 (Week 1): STG=LTG due to short LOS  Skilled Therapeutic Interventions/Progress Updates:    Pt sitting in recliner with RN and Respiratory Therapist present.  Pt missed first 21 mins of therapy secondary to nursing and respiratory care.  Pt stated she did not sleep "at all" the previous night because she was coughing all night.  Pt requested to use bathroom and amb with RW to use toilet.  Pt performed all tasks at supervision level but required extra time.  Pt walked to sink and completed grooming tasks while standing at sink.  Pt declined bathing and dressing tasks stating that she was exhausted and needed to rest.  Focus on activity tolerance, standing balance, functional amb with RW, and safety awareness.  Therapy Documentation Precautions:  Precautions Precautions: Fall Restrictions Weight Bearing Restrictions: No General: General Amount of Missed OT Time (min): 21 Minutes Missed Time Reason: Other (comment) (pt eating breakfast) Pain: Pain Assessment Pain Assessment: 0-10 Pain Score: 7  Pain Type: Acute pain Pain Location: Neck Pain Orientation: Left Pain Descriptors / Indicators: Aching Pain Frequency: Intermittent Pain Onset: On-going Patients Stated Pain Goal: 2 Pain Intervention(s): RN made aware;Repositioned 2nd Pain Site Pain Intervention(s): Repositioned;Ambulation/increased activity;RN made aware  See FIM for current functional status  Therapy/Group: Individual Therapy  Time: 4967-5916 Pt c/o left neck pain 3/10; RN aware, repositioned Individual Therapy  Pt amb with RW to engage in dynamic standing tasks and bilateral fine motor tasks/strengthening tasks.  Pt stood on compliant surface while placing clothes  pins (with increasing resistance) on metal dowels.  Pt stood on surface 2 X 5 mins with 3 min rest break.  Pt maintained balance with close supervision.  Pt transitioned to day room and amb without RW along counter top to water plants.  Pt returned to room and requested to use bathroom.  Pt stated she needed to change her Depends stating that she "leaked" while watering plants.  Pt remained in bathroom and nursing staff notified. Focus on safety awareness, activity tolerance, dynamic standing balance, and functional amb with and without AD.  Leotis Shames [facility] 01/16/2014, 9:48 AM

## 2014-01-17 ENCOUNTER — Encounter (HOSPITAL_COMMUNITY): Payer: Medicare Other

## 2014-01-17 ENCOUNTER — Inpatient Hospital Stay (HOSPITAL_COMMUNITY): Payer: Medicare Other

## 2014-01-17 ENCOUNTER — Inpatient Hospital Stay (HOSPITAL_COMMUNITY): Payer: Medicare Other | Admitting: Occupational Therapy

## 2014-01-17 DIAGNOSIS — R5381 Other malaise: Secondary | ICD-10-CM

## 2014-01-17 LAB — CBC
HCT: 33.1 % — ABNORMAL LOW (ref 36.0–46.0)
Hemoglobin: 10.3 g/dL — ABNORMAL LOW (ref 12.0–15.0)
MCH: 24.4 pg — ABNORMAL LOW (ref 26.0–34.0)
MCHC: 31.1 g/dL (ref 30.0–36.0)
MCV: 78.4 fL (ref 78.0–100.0)
PLATELETS: 318 10*3/uL (ref 150–400)
RBC: 4.22 MIL/uL (ref 3.87–5.11)
RDW: 16.2 % — ABNORMAL HIGH (ref 11.5–15.5)
WBC: 7.3 10*3/uL (ref 4.0–10.5)

## 2014-01-17 MED ORDER — AMIODARONE HCL 200 MG PO TABS: ORAL_TABLET | ORAL | Status: DC

## 2014-01-17 MED ORDER — OXYBUTYNIN CHLORIDE 5 MG PO TABS
2.5000 mg | ORAL_TABLET | Freq: Two times a day (BID) | ORAL | Status: DC
Start: 2014-01-17 — End: 2014-01-27

## 2014-01-17 MED ORDER — METOCLOPRAMIDE HCL 5 MG PO TABS: 5.0000 mg | ORAL_TABLET | Freq: Two times a day (BID) | ORAL | Status: DC

## 2014-01-17 MED ORDER — METOCLOPRAMIDE HCL 5 MG PO TABS
5.0000 mg | ORAL_TABLET | Freq: Two times a day (BID) | ORAL | Status: DC
  Administered 2014-01-17 – 2014-01-18 (×2): 5 mg via ORAL
  Filled 2014-01-17 (×4): qty 1

## 2014-01-17 MED ORDER — OXYBUTYNIN CHLORIDE 5 MG PO TABS
2.5000 mg | ORAL_TABLET | Freq: Two times a day (BID) | ORAL | Status: DC
Start: 2014-01-17 — End: 2014-01-17

## 2014-01-17 NOTE — Progress Notes (Signed)
NUTRITION FOLLOW UP  INTERVENTION:  D/C Resource Breeze supplement  RD to continue to follow  NUTRITION DIAGNOSIS: Inadequate oral intake, resolved  Goal: Pt to meet >/= 90% of their estimated nutrition needs, met   Monitor:  PO intake, weight, labs, I/O's  ASSESSMENT: 80 y.o. female with history of A fib, HTN, esophageal cancer s/p esophagectomy with gastric pull thorough, severe GERD, gastroparesis, recurrent aspiration PNA for past 6 months; who was found by her neighbor in her garden tub on 01/06/14. She reported having tripped and fallen in the tub about 12 hours prior to being found. Family reported recent cold type symptoms for the past few weeks with decrease in po intake as well as recent d/c of coumadin due to hematemesis.  Patient continues on a Regular diet.  Per Nurse Tech, patient eating well; consumed 75% this AM for breakfast.  RN reports patient does not care for Resource Breeze; causes upset stomach; RD to discontinue.  Height: Ht Readings from Last 1 Encounters:  01/09/14 5' (1.524 m)    Weight: Wt Readings from Last 1 Encounters:  01/09/14 123 lb 10.9 oz (56.1 kg)    BMI:  Body mass index is 24.15 kg/(m^2).  Estimated Nutritional Needs: Kcal: 1400-1600 Protein: 65-75 g Fluid: 1.4-1.6 L/day  Skin: stage I pressure ulcer on buttocks, incision on arm and wrist  Diet Order: General   Intake/Output Summary (Last 24 hours) at 01/17/14 1127 Last data filed at 01/17/14 0853  Gross per 24 hour  Intake    720 ml  Output      3 ml  Net    717 ml    Labs:   Recent Labs Lab 01/16/14 0430  NA 139  K 4.4  CL 97  CO2 33*  BUN 12  CREATININE 0.82  CALCIUM 9.9  GLUCOSE 92    CBG (last 3)   Recent Labs  01/15/14 2143 01/16/14 0747 01/16/14 1642  GLUCAP 104* 98 94    Scheduled Meds: . acetaminophen  650 mg Oral TID  . amiodarone  200 mg Oral BID  . [START ON 01/23/2014] amiodarone  200 mg Oral Daily  . arformoterol  15 mcg Nebulization  BID  . budesonide  0.25 mg Nebulization BID  . digoxin  0.125 mg Oral Daily  . feeding supplement (RESOURCE BREEZE)  1 Container Oral BID BM  . furosemide  40 mg Oral Daily  . olopatadine  1 drop Both Eyes BID  . oxybutynin  2.5 mg Oral BID  . pantoprazole  40 mg Oral BID  . potassium chloride  10 mEq Oral BID  . saccharomyces boulardii  500 mg Oral BID  . sucralfate  1 g Oral BID  . Vitamin D (Ergocalciferol)  50,000 Units Oral Q7 days    Continuous Infusions:   Past Medical History  Diagnosis Date  . GERD (gastroesophageal reflux disease)   . Osteoporosis   . Postmenopausal HRT (hormone replacement therapy)   . Anemia   . Hypertension   . Allergy   . Asthma   . Shortness of breath   . PONV (postoperative nausea and vomiting)   . Dysrhythmia     a fib  . Esophageal cancer     removed esophagus stomach pulled up  . Basal cell carcinoma of face     "several; freezes them off"  . Pneumonia     "all the time; probably 5 times in the last year, counting today" (01/06/2014)  . Chronic bronchitis     "  practically q winter" (01/06/2014)  . History of blood transfusion 1993    "related to esophagus removal" (01/06/2014)  . Headache(784.0)     "monthly in the past year" (01/06/2014)  . Arthritis     "legs" (01/06/2014)    Past Surgical History  Procedure Laterality Date  . Left oophorectomy  1952  . Esophageal removal of cancer,gastric ppull-thru 1993    . Forearm fracture surgery Right ?1990's  . Esophagogastroduodenoscopy  10/19/2011    Procedure: ESOPHAGOGASTRODUODENOSCOPY (EGD);  Surgeon: Milus Banister, MD;  Location: [facility];  Service: Endoscopy;  Laterality: N/A;  . Balloon dilation  10/19/2011    Procedure: BALLOON DILATION;  Surgeon: Milus Banister, MD;  Location: [facility]COPY;  Service: Endoscopy;  Laterality: N/A;  . Esophagogastroduodenoscopy  11/07/2011    Procedure: ESOPHAGOGASTRODUODENOSCOPY (EGD);  Surgeon: Ladene Artist, MD,FACG;  Location: [facility];   Service: Endoscopy;  Laterality: N/A;  . Esophagogastroduodenoscopy  11/22/2011    Procedure: ESOPHAGOGASTRODUODENOSCOPY (EGD);  Surgeon: Inda Castle, MD;  Location: [facility];  Service: Endoscopy;  Laterality: N/A;  . Esophagogastroduodenoscopy  11/24/2011    Procedure: ESOPHAGOGASTRODUODENOSCOPY (EGD);  Surgeon: Inda Castle, MD;  Location: [facility];  Service: Endoscopy;  Laterality: N/A;  with removable duodenal stent (actually using esophageal partially covered 23X15 located in locked supply room  . Duodenal stent placement  11/24/2011    Procedure: DUODENAL STENT PLACEMENT;  Surgeon: Inda Castle, MD;  Location: [facility] ENDOSCOPY;  Service: Endoscopy;  Laterality: N/A;  . Video bronchoscopy Bilateral 01/18/2013    Procedure: VIDEO BRONCHOSCOPY WITHOUT FLUORO;  Surgeon: Tanda Rockers, MD;  Location: [facility] ENDOSCOPY;  Service: Cardiopulmonary;  Laterality: Bilateral;  . Esophagogastroduodenoscopy N/A 02/11/2013    Procedure: ESOPHAGOGASTRODUODENOSCOPY (EGD);  Surgeon: Inda Castle, MD;  Location: [facility];  Service: Endoscopy;  Laterality: N/A;  . Duodenal stent placement N/A 02/11/2013    Procedure: DUODENAL STENT PLACEMENT;  Surgeon: Inda Castle, MD;  Location: [facility];  Service: Endoscopy;  Laterality: N/A;  . Esophagogastroduodenoscopy N/A 09/11/2013    Procedure: ESOPHAGOGASTRODUODENOSCOPY (EGD);  Surgeon: Gatha Mayer, MD;  Location: [facility];  Service: Endoscopy;  Laterality: N/A;  . Cholecystectomy  ~ 1996  . Dilation and curettage of uterus  X 6-7  . Cataract extraction w/ intraocular lens  implant, bilateral Bilateral ~ 2000    Katie Lamberton, RD, LDN Pager #: [phone] After-Hours Pager #: [phone]

## 2014-01-17 NOTE — Progress Notes (Signed)
Physical Therapy Discharge Summary  Patient Details  Name: [name] MRN: [id number] Date of Birth: 1929-03-11  Today's Date: 01/17/2014 Time: Treatment Session 1: [id number]; Treatment Session 2:  Time Calculation (min): Treatment Session 1: 55 min; Treatment Session 2:   Patient has met 8 of 9 long term goals due to improved activity tolerance, improved balance, improved postural control, increased strength, increased range of motion and decreased pain.  Patient to discharge at an ambulatory level Modified Independent. Pt and niece, [name], verbalized understanding regarding home safety education and recommendation for supervision when in community.   Reasons goals not met: Did not have pt physically perform floor transfer, verbally discussed safety in home environment to prevent fall as well as wearing safety alarm to alert for help if needed.   Recommendation:  Patient will benefit from ongoing skilled PT services in outpatient setting to continue to advance safe functional mobility, address ongoing impairments in decreased functional endurance, decreased strength, decreased neck ROM/posture, decreased standing balance, and minimize fall risk.  Equipment: Rolling walker  Reasons for discharge: treatment goals met and discharge from hospital  Patient/family agrees with progress made and goals achieved: Yes  Skilled Therapeutic Interventions Treatment Session 1:  1:1. Pt received sitting in recliner sleeping, req mod verbal/tactile cues to wake. Focus this session on assessment of safe functional mobility, gait training in community environment and d/c planning. Pt able to demonstrate toileting x2, ambulation in controlled/home environments x200' and furniture transfers at [facility]) level w/ use of RW. Pt req cues 1x for safety w/ RW during turns. Pt req supervision for ambulation in community environment 300'x2 on/off elevators, in lobby, outside on uneven surfaces, up/down 6 steps w/  single rail and car transfer. During amb in community environment pt req cues to look up and attend to thresholds due to quick pace. Pt educated on d/c process, goals of OP PT, safety in home environment and fall prevention, pt verbalized understanding. Pt sitting in recliner at end of session w/ all needs in reach.   Treatment Session 2:  1:1. Pt received sitting in recliner asleep, req mod verbal/tactile cues to wake. Pt able to toilet mod(I) at start of session and during session. Focus this session on strength, endurance and ROM. Pt utilized NuStep for B UE/LE strength/endurance, level 3x10 min w/ good tolerance. Heat pack applied to neck during use for improved extensibility in neck musculature. Pt demonstrated good understanding of HEP addressing neck ROM/stretching, only req cues to hold stretches for 20seconds. New handout provided. Mod(I) for ambulation in room as well as room<>therapy gym. Pt experiencing frequent and loose BM at end of session, req therapist to obtain items for clean up. Pt demonstrating good insight as to what items she would have in her bathroom to assist with clean up to be mod(I) at home. Pt left in bathroom, nurse tech aware and stated that she would check on pt.   Stopped by later when pt's niece also present to discuss d/c planning, recommendation for supervision when in community environment, prevention of falls in home, pt's need to wear safety alarm, goals of OP PT and recommended use of RW for pt to be mod(I) in home. Pt and niece verbalized understanding.   PT Discharge Precautions/Restrictions Precautions Precautions: Fall Precaution Comments: Requires use of RW to decrease risk for falling Restrictions Weight Bearing Restrictions: No Vital Signs Oxygen Therapy SpO2: 96 % O2 Device: None (Room air) Pain Pain Assessment Pain Assessment: 0-10 Pain Score: 3  Pain Type:  Acute pain Pain Location: Neck Pain Orientation: Left Pain Descriptors / Indicators:  Aching Pain Frequency: Intermittent Pain Onset: On-going Patients Stated Pain Goal: 5 Pain Intervention(s):  (Pt reports receiving pain meds earlier in AM) Vision/Perception  At baseline Cognition Overall Cognitive Status: Within Functional Limits for tasks assessed Arousal/Alertness: Awake/alert Orientation Level: Oriented X4 Attention: Selective Selective Attention: Appears intact Memory: Appears intact Awareness: Appears intact Problem Solving: Appears intact Safety/Judgment: Appears intact Sensation Sensation Light Touch: Appears Intact Proprioception: Appears Intact Coordination Gross Motor Movements are Fluid and Coordinated: Yes Fine Motor Movements are Fluid and Coordinated: Yes Motor  Motor Motor: Other (comment) Motor - Discharge Observations: Generalized weakness, but improved  Mobility Bed Mobility Bed Mobility: Supine to Sit;Sit to Supine Supine to Sit: 6: Modified independent (Device/Increase time) Supine to Sit Details (indicate cue type and reason): Pt sleeps in recliner at home Sit to Supine: 6: Modified independent (Device/Increase time) Transfers Transfers: Yes Sit to Stand: 6: Modified independent (Device/Increase time) Stand to Sit: 6: Modified independent (Device/Increase time) Stand Pivot Transfers: 6: Modified independent (Device/Increase time) Locomotion  Ambulation Ambulation: Yes Ambulation/Gait Assistance: 6: Modified independent (Device/Increase time) Ambulation Distance (Feet): 200 Feet Assistive device: Rolling walker Ambulation/Gait Assistance Details: Verbal cues for precautions/safety;Verbal cues for safe use of DME/AE Ambulation/Gait Assistance Details: Mod(I) in controlled/home environments, Supervision in community for safety w/ cues to decrease speed; Overall cues to look up to see where she is going Gait Gait: Yes Gait Pattern: Impaired Gait Pattern: Trunk flexed;Lateral trunk lean to left;Decreased hip/knee flexion -  right;Decreased hip/knee flexion - left;Decreased dorsiflexion - left;Decreased dorsiflexion - right;Narrow base of support;Step-through pattern Gait velocity: slightly increased Stairs / Additional Locomotion Stairs: Yes Stairs Assistance: 5: Supervision Stairs Assistance Details: Verbal cues for precautions/safety Stair Management Technique: One rail Left;Forwards;Step to pattern Number of Stairs: 6 Wheelchair Mobility Wheelchair Mobility: No (Pt at ambulatory level)  Trunk/Postural Assessment  Cervical Assessment Cervical Assessment: Within Functional Limits Cervical AROM Overall Cervical AROM Comments: B rotation decreased; Side flexion limited to R  Cervical Strength Overall Cervical Strength Comments: Pt presents w/ L side flexion, R rotation. Improved positioning immediately following ROM exercises.  Thoracic Assessment Thoracic Assessment: Exceptions to WFL (kyphotic) Lumbar Assessment Lumbar Assessment: Within Functional Limits Postural Control Postural Control: Deficits on evaluation Head Control: decreased; presents w/ L side flexion and R rotation after bout of pneumonia 8m ago.  Righting Reactions: benefits from use of RW to prevent LOB  Balance Balance Balance Assessed: Yes Static Sitting Balance Static Sitting - Balance Support: Feet supported;No upper extremity supported Static Sitting - Level of Assistance: 7: Independent Dynamic Sitting Balance Dynamic Sitting - Balance Support: Feet supported;Right upper extremity supported;Left upper extremity supported;Bilateral upper extremity supported;Feet unsupported Dynamic Sitting - Level of Assistance: 6: Modified independent (Device/Increase time);5: Stand by assistance Dynamic Sitting - Balance Activities: Lateral lean/weight shifting;Forward lean/weight shifting;Reaching for objects Static Standing Balance Static Standing - Balance Support: No upper extremity supported;Bilateral upper extremity supported Static  Standing - Level of Assistance: 6: Modified independent (Device/Increase time) Dynamic Standing Balance Dynamic Standing - Balance Support: Bilateral upper extremity supported;Right upper extremity supported;Left upper extremity supported Dynamic Standing - Level of Assistance: 6: Modified independent (Device/Increase time) Dynamic Standing - Balance Activities: Forward lean/weight shifting;Lateral lean/weight shifting;Reaching for objects Extremity Assessment  RUE Assessment RUE Assessment: Within Functional Limits LUE Assessment LUE Assessment: Within Functional Limits RLE Assessment RLE Assessment: Within Functional Limits RLE Strength RLE Overall Strength Comments: Grossly 4/5 LLE Assessment LLE Assessment: Within Functional Limits LLE Strength   LLE Overall Strength Comments: Grossly 4/5  See FIM for current functional status  King, Caroline S 01/17/2014, 5:49 PM   

## 2014-01-17 NOTE — Discharge Instructions (Signed)
Inpatient Rehab Discharge Instructions  ALENA BLANKENBECKLER Discharge date and time: 01/18/14   Activities/Precautions/ Functional Status: Activity: activity as tolerated Diet: regular diet Wound Care: keep wound clean and dry. Home health nurse to change dressing in 3-5 days  Functional status:  ___ No restrictions     ___ Walk up steps independently ___ 24/7 supervision/assistance   ___ Walk up steps with assistance ___ Intermittent supervision/assistance  ___ Bathe/dress independently _X__ Walk with walker     ___ Bathe/dress with assistance ___ Walk Independently    ___ Shower independently ___ Walk with assistance    ___ Shower with assistance _X__ No alcohol     ___ Return to work/school ________  Special Instructions:    My questions have been answered and I understand these instructions. I will adhere to these goals and the provided educational materials after my discharge from the hospital.  Patient/Caregiver Signature _______________________________ Date __________  Clinician Signature _______________________________________ Date __________  Please bring this form and your medication list with you to all your follow-up doctor's appointments.

## 2014-01-17 NOTE — Plan of Care (Signed)
Problem: RH PAIN MANAGEMENT Goal: RH STG PAIN MANAGED AT OR BELOW PT'S PAIN GOAL Maintains pain level of 2 or less on a scale of 0-10  Outcome: Not Met (add Reason) Reports pain greater than 2. Pain managed with scheduled Tylenol

## 2014-01-17 NOTE — Discharge Summary (Signed)
Physician Discharge Summary  Patient ID: REESHEMAH NAZARYAN MRN: [id number] DOB/AGE: Apr 12, 1929 80 y.o.  Admit date: 01/09/2014 Discharge date: 01/17/2014  Discharge Diagnoses:  Active Problems:   Multifactorial gait disorder   Discharged Condition: Stable.   Significant Diagnostic Studies: Dg Chest 2 View  01/17/2014   CLINICAL DATA:  Persistent cough  EXAM: CHEST  2 VIEW  COMPARISON:  01/15/2014  FINDINGS: Cardiomediastinal silhouette is stable. Gastroesophageal stent again noted. No pulmonary edema. There is persistent streaky atelectasis, scarring or infiltrate in left lower lobe silhouetting the left heart border. Stable right PICC line.  IMPRESSION: No pulmonary edema. There is persistent streaky atelectasis, scarring or infiltrate in left lower lobe silhouetting the left heart border. Stable right PICC line.   Electronically Signed   By: Lahoma Crocker M.D.   On: 01/17/2014 12:25   Dg Chest 2 View  01/15/2014   CLINICAL DATA:  Pneumonia.  EXAM: CHEST  2 VIEW  COMPARISON:  January 10, 2014.  FINDINGS: Stable cardiomegaly. Stable left basilar density is noted consistent with postsurgical changes and atelectasis. No pneumothorax is noted. Right-sided PICC line is unchanged in position distal esophageal stent is again noted. No significant pleural effusion is noted. Increased curvilinear opacity is seen in the right upper lobe which may represent subsegmental atelectasis.  IMPRESSION: Stable left basilar density is noted consistent with atelectasis and postsurgical changes. New curvilinear density is noted in right upper lobe consistent with subsegmental atelectasis.   Electronically Signed   By: Sabino Dick M.D.   On: 01/15/2014 10:16   Dg Chest 2 View  01/10/2014   CLINICAL DATA:  Pneumonia.  EXAM: CHEST  2 VIEW  COMPARISON:  01/08/2014  FINDINGS: The heart is enlarged but stable. Stable surgical changes with an esophageal stent in place. The right PICC line is stable. There is persistent left  lower lobe opacity most of which is account for by the gastric pull-through and adjacent atelectasis. A small effusion is also probable.  IMPRESSION: Stable surgical changes related to prior gastric pull-through and esophageal stent. There is persistent atelectasis around the stomach and a probable small left pleural effusion.   Electronically Signed   By: Kalman Jewels M.D.   On: 01/10/2014 13:31    Labs:  Basic Metabolic Panel:  Recent Labs Lab 01/16/14 0430  NA 139  K 4.4  CL 97  CO2 33*  GLUCOSE 92  BUN 12  CREATININE 0.82  CALCIUM 9.9    CBC:  Recent Labs Lab 01/16/14 0430 01/17/14 0533  WBC 11.3* 7.3  HGB 11.1* 10.3*  HCT 35.4* 33.1*  MCV 78.7 78.4  PLT 355 318    CBG:  Recent Labs Lab 01/15/14 1112 01/15/14 1650 01/15/14 2143 01/16/14 0747 01/16/14 1642  GLUCAP 105* 105* 104* 98 94    Brief HPI:   [name] is a 80 y.o. female with history of A fib, HTN, esophageal cancer s/p esophagectomy with gastric pull thorough, severe GERD, gastroparesis, recurrent aspiration PNA for past 6 months; who was found by her neighbor in her garden tub on 01/06/14. She reported having tripped and fallen in the tub about 12 hours prior to being found. Family reported recent cold type symptoms for the past few weeks with decrease in po intake as well as recent d/c of coumadin due to hematemesis. CT head without acute changes. CT cervical spine with mild spinal stenosis with cord flattening and dilated esophagus. She was started on IV antibiotics for LLL aspiration PNA and therapies  ordered. Digoxin discontinued as family question A fib history. She develop A Fib with RVR 150-170 requiring IV metoprolol and amiodarone. 2D echo with EF 60-65% and    Hospital Course: MARKAN CAZAREZ was admitted to rehab 01/09/2014 for inpatient therapies to consist of PT, ST and OT at least three hours five days a week. Past admission physiatrist, therapy team and rehab RN have worked  together to provide customized collaborative inpatient rehab. Pain control was reasonable with scheduled tylenol as well as tramadol on prn basis. Heart rate has been monitored every 8 hours and has been controlled without recurrent tachycardia. She completed rocephin for recurrent aspiration PNA and E coli UTI. Follow up CXR showed improvement. She has problems with bowel and bladder incontinence and probiotic was added to help with symptoms. Stools were negative for C diff.  Reglan was held for a few day and resumed prior to discharge. She was started on ditropan to help with hyperactive bladder symptoms. Blood pressures were monitored for orthostatic changes and showed minimal changes.  Abrasion on left forearm was managed with use of mepitel as well as foam dressing. This is healing well without signs or symptoms of infection.  MBS was done by speech therapy showing no evidence of aspiration and prominent cricopharyngeal segment.  She was maintained on regular diet with aspiration precautions. She made good progress during her stay and has reached independent level. She will continue to receive HHPT, [place], HHRN, SW as well as bath aide via [place] past discharge   Rehab course: During patient's stay in rehab weekly team conferences were held to monitor patient's progress, set goals and discuss barriers to discharge. Patient has had improvement in activity tolerance, balance, postural control, as well as ability to compensate for deficits. She is able to complete bathing and dressing tasks with more than reasonable amount of time. She was independent for transfers as well as ambulation in supervised setting with use of RW. She needs supervisio when in community setting. Family education was done with niece who will provide intermittent supervision past discharge.    Disposition: Home.   Diet: Regular--multiple small meals.   Special Instructions: 1. Keep left elbow abrasion dressing clean and  dry. HHRN to change dressing in 3-5 days.     Medication List    STOP taking these medications       Ampicillin-Sulbactam 3 g in sodium chloride 0.9 % 100 mL     Levofloxacin 250 MG/50ML Soln  Commonly known as:  LEVAQUIN      TAKE these medications       acetaminophen 500 MG tablet  Commonly known as:  TYLENOL  Take 1 tablet (500 mg total) by mouth every 6 (six) hours as needed for headache.     albuterol 108 (90 BASE) MCG/ACT inhaler  Commonly known as:  PROVENTIL HFA;VENTOLIN HFA  Inhale 2 puffs into the lungs every 6 (six) hours as needed. For wheezing     albuterol (2.5 MG/3ML) 0.083% nebulizer solution  Commonly known as:  PROVENTIL  Take 3 mLs (2.5 mg total) by nebulization every 4 (four) hours as needed for wheezing or shortness of breath.     amiodarone 200 MG tablet  Commonly known as:  PACERONE  One pill twice a day till 01/22/14. Then decrease to one pill daily     arformoterol 15 MCG/2ML Nebu  Commonly known as:  BROVANA  Take 2 mLs (15 mcg total) by nebulization 2 (two) times daily. Dx: 493.00  B-12 5000 MCG Tbdp  Take 1 tablet by mouth daily.     budesonide 0.25 MG/2ML nebulizer solution  Commonly known as:  PULMICORT  Take 2 mLs (0.25 mg total) by nebulization 2 (two) times daily. Dx: 493.00     chlorpheniramine-HYDROcodone 10-8 MG/5ML Lqcr  Commonly known as:  TUSSIONEX PENNKINETIC ER  Take 5 mLs by mouth every 12 (twelve) hours as needed for cough.     dextromethorphan-guaiFENesin 30-600 MG per 12 hr tablet  Commonly known as:  MUCINEX DM  Take 1 tablet by mouth 2 (two) times daily as needed for cough.     digoxin 0.125 MG tablet  Commonly known as:  LANOXIN  Take 1 tablet (0.125 mg total) by mouth daily.     feeding supplement (ENSURE COMPLETE) Liqd  Take 237 mLs by mouth daily with lunch.     furosemide 40 MG tablet  Commonly known as:  LASIX  Take 1 tablet (40 mg total) by mouth daily.     HEMOCYTE PLUS 106-1 MG Caps  Take 1 tablet  by mouth 2 (two) times daily.     metoCLOPramide 5 MG tablet  Commonly known as:  REGLAN  Take 1 tablet (5 mg total) by mouth 2 (two) times daily before a meal.     oxybutynin 5 MG tablet  Commonly known as:  DITROPAN  Take 0.5 tablets (2.5 mg total) by mouth 2 (two) times daily. For hyperactive bladder     pantoprazole 40 MG tablet  Commonly known as:  PROTONIX  Take 1 tablet (40 mg total) by mouth 2 (two) times daily before a meal.     PATADAY 0.2 % Soln  Generic drug:  Olopatadine HCl  Apply 1-2 drops to eye 2 (two) times daily as needed (dry, itchy eyes.).     promethazine 25 MG suppository  Commonly known as:  PHENERGAN  Place 25 mg rectally every 6 (six) hours as needed for nausea or vomiting.     sucralfate 1 GM/10ML suspension  Commonly known as:  CARAFATE  Take 10 mLs (1 g total) by mouth 2 (two) times daily before a meal.     traMADol 50 MG tablet  Commonly known as:  ULTRAM  Take 1 tablet (50 mg total) by mouth every 8 (eight) hours as needed for moderate pain.     VIACTIV MULTI-VITAMIN Chew  Chew 1 each by mouth daily.     Vitamin D (Ergocalciferol) 50000 UNITS Caps capsule  Commonly known as:  DRISDOL  Take 1 capsule (50,000 Units total) by mouth every 7 (seven) days. On Friday     zolpidem 5 MG tablet  Commonly known as:  AMBIEN  Take 5 mg by mouth at bedtime.       Follow-up Information   Follow up with [facility] On 02/05/2014. (appointment at 9:30 am)    Contact information:   [place] [id number]       Follow up with Donia Ast, FNP On 02/06/2014. (appointment at 8:15 am)    Specialty:  Family Medicine   Contact information:   [place] [address] [phone]       Call Meredith Staggers, MD. (As needed)    Specialty:  Physical Medicine and Rehabilitation   Contact information:   [address], [place] [address] [phone]       Signed: Bary Leriche 01/17/2014, 6:28 PM

## 2014-01-17 NOTE — Progress Notes (Signed)
Occupational Therapy Session Note  Patient Details  Name: [name] MRN: [id number] Date of Birth: [birth year]  Today's Date: 01/17/2014 Time: 1135-1200 Time Calculation (min): 25 min  Short Term Goals: Week 1:  OT Short Term Goal 1 (Week 1): STG=LTG due to short LOS  Skilled Therapeutic Interventions/Progress Updates:  Therapeutic activity focusing on functional mobility/ambulation with RW, overall activity tolerance/endurance, and overall safety. Patient ambulated <>1st floor gift shop and outdoors on uneven surfaces. If patient were to loose balance slightly, she was able to re-correct at mod I level. At end of session, left patient in room; patient mod I within room using RW.   Precautions:  Precautions Precautions: Fall Precaution Comments: Requires use of RW to decrease risk for falling Restrictions Weight Bearing Restrictions: No  See FIM for current functional status  Therapy/Group: Individual Therapy  CLAY,PATRICIA 01/17/2014, 12:10 PM

## 2014-01-17 NOTE — Plan of Care (Signed)
Problem: RH SKIN INTEGRITY Goal: RH STG ABLE TO PERFORM INCISION/WOUND CARE W/ASSISTANCE STG Able To Perform Incision/Wound Care With supervision Assistance.  Outcome: Completed/Met Date Met:  01/17/14 Home health to assist with wound care     

## 2014-01-17 NOTE — Progress Notes (Signed)
80 y.o. female with history of A fib, HTN, esophageal cancer s/p esophagectomy with gastric pull thorough, severe GERD, gastroparesis, recurrent aspiration PNA for past 6 months; who was found by her neighbor in her garden tub on 01/06/14. She reported having tripped and fallen in the tub about 12 hours prior to being found. Family reported recent cold type symptoms for the past few weeks with decrease in po intake as well as recent d/c of coumadin due to hematemesis. CT head without acute changes. CT cervical spine with mild spinal stenosis with cord flattening and dilated esophagus. She was started on IV antibiotics for LLL aspiration PNA and therapies ordered. Digoxin discontinued as family question A fib history. She develop A Fib with RVR 150-170 requiring IV metoprolol and amiodarone. 2D echo with EF 60-65%   Subjective/Complaints: Still had some coughing last night.     Objective: Vital Signs: Blood pressure 117/73, pulse 63, temperature 97.5 F (36.4 C), temperature source Oral, resp. rate 19, height 5' (1.524 m), weight 56.1 kg (123 lb 10.9 oz), SpO2 96.00%. Dg Chest 2 View  01/15/2014   CLINICAL DATA:  Pneumonia.  EXAM: CHEST  2 VIEW  COMPARISON:  January 10, 2014.  FINDINGS: Stable cardiomegaly. Stable left basilar density is noted consistent with postsurgical changes and atelectasis. No pneumothorax is noted. Right-sided PICC line is unchanged in position distal esophageal stent is again noted. No significant pleural effusion is noted. Increased curvilinear opacity is seen in the right upper lobe which may represent subsegmental atelectasis.  IMPRESSION: Stable left basilar density is noted consistent with atelectasis and postsurgical changes. New curvilinear density is noted in right upper lobe consistent with subsegmental atelectasis.   Electronically Signed   By: Sabino Dick M.D.   On: 01/15/2014 10:16   Results for orders placed during the hospital encounter of 01/09/14 (from the past 72  hour(s))  GLUCOSE, CAPILLARY     Status: Abnormal   Collection Time    01/14/14 11:18 AM      Result Value Ref Range   Glucose-Capillary 112 (*) 70 - 99 mg/dL   Comment 1 Notify RN    GLUCOSE, CAPILLARY     Status: Abnormal   Collection Time    01/14/14  4:45 PM      Result Value Ref Range   Glucose-Capillary 103 (*) 70 - 99 mg/dL  GLUCOSE, CAPILLARY     Status: Abnormal   Collection Time    01/14/14 10:00 PM      Result Value Ref Range   Glucose-Capillary 104 (*) 70 - 99 mg/dL  URINALYSIS, ROUTINE W REFLEX MICROSCOPIC     Status: None   Collection Time    01/14/14 10:41 PM      Result Value Ref Range   Color, Urine YELLOW  YELLOW   APPearance CLEAR  CLEAR   Specific Gravity, Urine 1.012  1.005 - 1.030   pH 7.0  5.0 - 8.0   Glucose, UA NEGATIVE  NEGATIVE mg/dL   Hgb urine dipstick NEGATIVE  NEGATIVE   Bilirubin Urine NEGATIVE  NEGATIVE   Ketones, ur NEGATIVE  NEGATIVE mg/dL   Protein, ur NEGATIVE  NEGATIVE mg/dL   Urobilinogen, UA 0.2  0.0 - 1.0 mg/dL   Nitrite NEGATIVE  NEGATIVE   Leukocytes, UA NEGATIVE  NEGATIVE   Comment: MICROSCOPIC NOT DONE ON URINES WITH NEGATIVE PROTEIN, BLOOD, LEUKOCYTES, NITRITE, OR GLUCOSE <1000 mg/dL.  URINE CULTURE     Status: None   Collection Time    01/14/14  10:41 PM      Result Value Ref Range   Specimen Description URINE, RANDOM     Special Requests NONE     Culture  Setup Time       Value: 01/15/2014 08:41     Performed at [facility] PENDING     Culture       Value: Culture reincubated for better growth     Performed at [facility]   Report Status PENDING    GLUCOSE, CAPILLARY     Status: None   Collection Time    01/15/14  7:17 AM      Result Value Ref Range   Glucose-Capillary 97  70 - 99 mg/dL   Comment 1 Notify RN    GLUCOSE, CAPILLARY     Status: Abnormal   Collection Time    01/15/14 11:12 AM      Result Value Ref Range   Glucose-Capillary 105 (*) 70 - 99 mg/dL   Comment 1 Notify RN     GLUCOSE, CAPILLARY     Status: Abnormal   Collection Time    01/15/14  4:50 PM      Result Value Ref Range   Glucose-Capillary 105 (*) 70 - 99 mg/dL  GLUCOSE, CAPILLARY     Status: Abnormal   Collection Time    01/15/14  9:43 PM      Result Value Ref Range   Glucose-Capillary 104 (*) 70 - 99 mg/dL  CBC     Status: Abnormal   Collection Time    01/16/14  4:30 AM      Result Value Ref Range   WBC 11.3 (*) 4.0 - 10.5 K/uL   RBC 4.50  3.87 - 5.11 MIL/uL   Hemoglobin 11.1 (*) 12.0 - 15.0 g/dL   HCT 35.4 (*) 36.0 - 46.0 %   MCV 78.7  78.0 - 100.0 fL   MCH 24.7 (*) 26.0 - 34.0 pg   MCHC 31.4  30.0 - 36.0 g/dL   RDW 16.4 (*) 11.5 - 15.5 %   Platelets 355  150 - 400 K/uL  BASIC METABOLIC PANEL     Status: Abnormal   Collection Time    01/16/14  4:30 AM      Result Value Ref Range   Sodium 139  137 - 147 mEq/L   Potassium 4.4  3.7 - 5.3 mEq/L   Chloride 97  96 - 112 mEq/L   CO2 33 (*) 19 - 32 mEq/L   Glucose, Bld 92  70 - 99 mg/dL   BUN 12  6 - 23 mg/dL   Creatinine, Ser 0.82  0.50 - 1.10 mg/dL   Calcium 9.9  8.4 - 10.5 mg/dL   GFR calc non Af Amer 65 (*) >90 mL/min   GFR calc Af Amer 75 (*) >90 mL/min   Comment: (NOTE)     The eGFR has been calculated using the CKD EPI equation.     This calculation has not been validated in all clinical situations.     eGFR's persistently <90 mL/min signify possible Chronic Kidney     Disease.  GLUCOSE, CAPILLARY     Status: None   Collection Time    01/16/14  7:47 AM      Result Value Ref Range   Glucose-Capillary 98  70 - 99 mg/dL   Comment 1 Notify RN    GLUCOSE, CAPILLARY     Status: None   Collection Time  01/16/14  4:42 PM      Result Value Ref Range   Glucose-Capillary 94  70 - 99 mg/dL  CBC     Status: Abnormal   Collection Time    01/17/14  5:33 AM      Result Value Ref Range   WBC 7.3  4.0 - 10.5 K/uL   RBC 4.22  3.87 - 5.11 MIL/uL   Hemoglobin 10.3 (*) 12.0 - 15.0 g/dL   HCT 33.1 (*) 36.0 - 46.0 %   MCV 78.4  78.0  - 100.0 fL   MCH 24.4 (*) 26.0 - 34.0 pg   MCHC 31.1  30.0 - 36.0 g/dL   RDW 16.2 (*) 11.5 - 15.5 %   Platelets 318  150 - 400 K/uL      Head: Normocephalic and atraumatic.  Eyes: Conjunctivae are normal. Pupils are equal, round, and reactive to light.  Cardiovascular: An irregularly irregular rhythm present.  Respiratory: Effort normal. No respiratory distress. She has crackles in the left lower field. She has no rhonchi  Has paroxsymal episodic productive coughing spells during conversation.  GI: Soft. Bowel sounds are normal. She exhibits no distension. There is no tenderness.  Musculoskeletal: She exhibits edema (1+ pitting edema BLE tibially). Head forward, kyphotic posture. Resting tremor RUE (tardive dyskinesia)  Neurological: She is alert and oriented to person, place, and time.  Pleasant and appropriate. Follows commands without difficulty.  Skin: Skin is warm and dry.  Large bruise upper mid thoracic spine.  Psychiatric: She has a normal mood and affect. Her behavior is normal. Thought content normal.  Motor strength is 4/5 bilateral deltoid, bicep, tricep, grip is 3+ to 4-, hip flexor, knee extensor, ankle dorsiflexor and plantar flexor. Tremors BUE, R>L. No sensory loss.   Assessment/Plan: 1. Functional deficits secondary to deconditioning after pneumonia, and multiple medical issues. Recent fall with trunk,soft tissue trauma  which require 3+ hours per day of interdisciplinary therapy in a comprehensive inpatient rehab setting. Physiatrist is providing close team supervision and 24 hour management of active medical problems listed below. Physiatrist and rehab team continue to assess barriers to discharge/monitor patient progress toward functional and medical goals.   FIM: FIM - Bathing Bathing Steps Patient Completed: Chest;Right Arm;Left Arm;Abdomen;Front perineal area;Right upper leg;Left upper leg;Right lower leg (including foot);Left lower leg (including  foot);Buttocks Bathing: 5: Set-up assist to: Adjust water temp  FIM - Upper Body Dressing/Undressing Upper body dressing/undressing steps patient completed: Thread/unthread right sleeve of pullover shirt/dresss;Thread/unthread left sleeve of pullover shirt/dress;Put head through opening of pull over shirt/dress;Thread/unthread right bra strap;Thread/unthread left bra strap;Hook/unhook bra Upper body dressing/undressing: 4: Min-Patient completed 75 plus % of tasks FIM - Lower Body Dressing/Undressing Lower body dressing/undressing steps patient completed: Thread/unthread right pants leg;Thread/unthread left pants leg;Don/Doff right sock;Don/Doff left sock;Don/Doff left shoe;Don/Doff right shoe;Thread/unthread right underwear leg;Thread/unthread left underwear leg;Pull underwear up/down Lower body dressing/undressing: 4: Min-Patient completed 75 plus % of tasks  FIM - Toileting Toileting steps completed by patient: Adjust clothing prior to toileting;Performs perineal hygiene Toileting Assistive Devices: Grab bar or rail for support Toileting: 5: Supervision: Safety issues/verbal cues  FIM - Radio producer Devices: Insurance account manager Transfers: 5-To toilet/BSC: Supervision (verbal cues/safety issues);5-From toilet/BSC: Supervision (verbal cues/safety issues)  FIM - Control and instrumentation engineer Devices: Walker;Arm rests Bed/Chair Transfer: 5: Chair or W/C > Bed: Supervision (verbal cues/safety issues);5: Bed > Chair or W/C: Supervision (verbal cues/safety issues)  FIM - Locomotion: Wheelchair Locomotion: Wheelchair: 0: Activity did not occur FIM -  Locomotion: Ambulation Locomotion: Ambulation Assistive Devices: Administrator Ambulation/Gait Assistance: 5: Supervision Locomotion: Ambulation: 5: Travels 150 ft or more with supervision/safety issues  Comprehension Comprehension Mode: Auditory Comprehension: 6-Follows complex  conversation/direction: With extra time/assistive device  Expression Expression Mode: Verbal Expression: 6-Expresses complex ideas: With extra time/assistive device  Social Interaction Social Interaction: 7-Interacts appropriately with others - No medications needed.  Problem Solving Problem Solving: 5-Solves complex 90% of the time/cues < 10% of the time  Memory Memory: 5-Recognizes or recalls 90% of the time/requires cueing < 10% of the time  Medical Problem List and Plan:  1. Functional deficits secondary to deconditioning after pneumonia, and multiple medical issues. Recent fall with trunk,soft tissue trauma   -relative weakness of grip. Don't see any other focal neuro signs. CT of neck negative for acute injury 2. DVT Prophylaxis/Anticoagulation: Mechanical: Antiembolism stockings, knee (TED hose) Bilateral lower extremities  Sequential compression devices, below knee Bilateral lower extremities  3. Pain Management: Will continue tylenol tid schedule with tramadol prn for moderate to severe pain.  4. Mood: Has a positive outlook. Will have LCSW follow for evaluation and support.  5. Neuropsych: This patient is capable of making decisions on her own behalf.  6. Recurrent Aspiration PNA/E coli UTI: stopped rocephin given diarrhea.   7. Leucocytosis:  Overall improved---recheck once more tomorrow  8. A fib: Now on amiodarone as well as digoxin for rate control  -bmet ok (volume status adequate) 9. Chronic cough: will schedule Tessalon perles. Use tussionex bid prn severe cough.  10 Esophageal cancer s/p resection with severe GERD/duodenal stent: Continue home regimen of PPI bid, Carafate and Reglan (held for now now).  11. Diarrhea: stopped antibiotics, hold reglan for now 12. Chronic bronchitis with asthma: Continue nebs bid.  continue Mucinex DM as well as IS while awake.   -given persistent cough, will recheck  cxr today prior to dc 13.  Mild hypoK likely related to loose stools  , supplemented 14. Urinary frequency--low pvr's  -added LOW dose ditropan  -ucx pending still   LOS (Days) 8 A FACE TO FACE EVALUATION WAS PERFORMED  SWARTZ,ZACHARY T 01/17/2014, 7:58 AM

## 2014-01-17 NOTE — Progress Notes (Signed)
Occupational Therapy Session Note  Patient Details  Name: [name] MRN: [id number] Date of Birth: 1929-04-26  Today's Date: 01/17/2014 Time: 4010-2725 Time Calculation (min): 55 min  Short Term Goals: Week 1:  OT Short Term Goal 1 (Week 1): STG=LTG due to short LOS  Skilled Therapeutic Interventions/Progress Updates:    Pt completed all bathing and dressing tasks at mod I level this morning during therapy session.  Pt amb with RW to gather clothing and supplies.  Pt stated she wanted to shower like she showers at home. Pt sat on tub bench and filled wash basin with water to bathe with. Pt rinsed with hand held shower. Pt completed dressing with sit<>stand from recliner.  Pt stood at sink to complete grooming tasks.  Pt requested to use toilet during session and completed all tasks at mod I level.  Focus on discharge planning, activity tolerance, safety awareness, functional amb with RW, and standing balance.  Therapy Documentation Precautions:  Precautions Precautions: Fall Restrictions Weight Bearing Restrictions: No Pain: Pain Assessment Pain Assessment: 0-10 Pain Score: 4  Pain Type: Acute pain Pain Location: Neck Pain Orientation: Left Pain Descriptors / Indicators: Aching Pain Frequency: Intermittent Pain Onset: On-going Patients Stated Pain Goal: 5 Pain Intervention(s): RN made aware;Repositioned  See FIM for current functional status  Therapy/Group: Individual Therapy  Leroy Libman 01/17/2014, 9:56 AM

## 2014-01-18 ENCOUNTER — Encounter (HOSPITAL_COMMUNITY): Payer: Self-pay | Admitting: *Deleted

## 2014-01-18 LAB — GLUCOSE, CAPILLARY
GLUCOSE-CAPILLARY: 99 mg/dL (ref 70–99)
Glucose-Capillary: 111 mg/dL — ABNORMAL HIGH (ref 70–99)

## 2014-01-18 NOTE — Progress Notes (Signed)
Patient discharged to home, transported by niece.  Right arm PICC removed by IV team this morning without complication.  Discharge instructions, education, and medications discussed with patient and family prior to discharge; all voice understanding of discharge information.  Patient and family deny any questions or concerns at this time.

## 2014-01-18 NOTE — Care Management Note (Signed)
    Page 1 of 1   01/18/2014     10:57:23 AM CARE MANAGEMENT NOTE 01/18/2014  Patient:  [name], [name]   Account Number:  [id number]  Date Initiated:  01/18/2014  Documentation initiated by:  [facility]  Subjective/Objective Assessment:   adm: Inpatient Rehabilitation with the diagnosis of deconditioning     Action/Plan:   discharge planning   Anticipated DC Date:  01/18/2014   Anticipated DC Plan:  [place]  CM consult      [facility] Choice  HOME HEALTH   Choice offered to / List presented to:  C-1 Patient        [place] arranged  HH-1 RN  [place]      [place].   Status of service:  Completed, signed off Medicare Important Message given?   (If response is "NO", the following Medicare IM given date fields will be blank) Date Medicare IM given:   Date Additional Medicare IM given:    Discharge Disposition:  [place]  Per UR Regulation:    If discussed at Long Length of Stay Meetings, dates discussed:    Comments:  01/18/14 10;00 CM received call from RN requesting arrangement for HHRN/PT/OT/AIDE.  cm MET WITH PT IN ROOM TO OFFER CHOICE AND PT CHOOSES [facility].  Address and contact information verified with pt.  Referral texted to [facility] rep, Winnie.  No other CM needs were communicated.  Mariane Masters, BSN, Cm [phone].

## 2014-01-18 NOTE — Progress Notes (Signed)
80 y.o. female with history of A fib, HTN, esophageal cancer s/p esophagectomy with gastric pull thorough, severe GERD, gastroparesis, recurrent aspiration PNA for past 6 months; who was found by her neighbor in her garden tub on 01/06/14. She reported having tripped and fallen in the tub about 12 hours prior to being found. Family reported recent cold type symptoms for the past few weeks with decrease in po intake as well as recent d/c of coumadin due to hematemesis. CT head without acute changes. CT cervical spine with mild spinal stenosis with cord flattening and dilated esophagus. She was started on IV antibiotics for LLL aspiration PNA and therapies ordered. Digoxin discontinued as family question A fib history. She develop A Fib with RVR 150-170 requiring IV metoprolol and amiodarone. 2D echo with EF 60-65%   Subjective/Complaints: Had a much better night. Pleased with progress and excited to go home.     Objective: Vital Signs: Blood pressure 103/65, pulse 58, temperature 97.5 F (36.4 C), temperature source Oral, resp. rate 19, height 5' (1.524 m), weight 56.1 kg (123 lb 10.9 oz), SpO2 98.00%. Dg Chest 2 View  01/17/2014   CLINICAL DATA:  Persistent cough  EXAM: CHEST  2 VIEW  COMPARISON:  01/15/2014  FINDINGS: Cardiomediastinal silhouette is stable. Gastroesophageal stent again noted. No pulmonary edema. There is persistent streaky atelectasis, scarring or infiltrate in left lower lobe silhouetting the left heart border. Stable right PICC line.  IMPRESSION: No pulmonary edema. There is persistent streaky atelectasis, scarring or infiltrate in left lower lobe silhouetting the left heart border. Stable right PICC line.   Electronically Signed   By: Lahoma Crocker M.D.   On: 01/17/2014 12:25   Results for orders placed during the hospital encounter of 01/09/14 (from the past 72 hour(s))  GLUCOSE, CAPILLARY     Status: Abnormal   Collection Time    01/15/14 11:12 AM      Result Value Ref Range    Glucose-Capillary 105 (*) 70 - 99 mg/dL   Comment 1 Notify RN    GLUCOSE, CAPILLARY     Status: Abnormal   Collection Time    01/15/14  4:50 PM      Result Value Ref Range   Glucose-Capillary 105 (*) 70 - 99 mg/dL  GLUCOSE, CAPILLARY     Status: Abnormal   Collection Time    01/15/14  9:43 PM      Result Value Ref Range   Glucose-Capillary 104 (*) 70 - 99 mg/dL  CBC     Status: Abnormal   Collection Time    01/16/14  4:30 AM      Result Value Ref Range   WBC 11.3 (*) 4.0 - 10.5 K/uL   RBC 4.50  3.87 - 5.11 MIL/uL   Hemoglobin 11.1 (*) 12.0 - 15.0 g/dL   HCT 35.4 (*) 36.0 - 46.0 %   MCV 78.7  78.0 - 100.0 fL   MCH 24.7 (*) 26.0 - 34.0 pg   MCHC 31.4  30.0 - 36.0 g/dL   RDW 16.4 (*) 11.5 - 15.5 %   Platelets 355  150 - 400 K/uL  BASIC METABOLIC PANEL     Status: Abnormal   Collection Time    01/16/14  4:30 AM      Result Value Ref Range   Sodium 139  137 - 147 mEq/L   Potassium 4.4  3.7 - 5.3 mEq/L   Chloride 97  96 - 112 mEq/L   CO2 33 (*) 19 -  32 mEq/L   Glucose, Bld 92  70 - 99 mg/dL   BUN 12  6 - 23 mg/dL   Creatinine, Ser 0.82  0.50 - 1.10 mg/dL   Calcium 9.9  8.4 - 10.5 mg/dL   GFR calc non Af Amer 65 (*) >90 mL/min   GFR calc Af Amer 75 (*) >90 mL/min   Comment: (NOTE)     The eGFR has been calculated using the CKD EPI equation.     This calculation has not been validated in all clinical situations.     eGFR's persistently <90 mL/min signify possible Chronic Kidney     Disease.  GLUCOSE, CAPILLARY     Status: None   Collection Time    01/16/14  7:47 AM      Result Value Ref Range   Glucose-Capillary 98  70 - 99 mg/dL   Comment 1 Notify RN    GLUCOSE, CAPILLARY     Status: None   Collection Time    01/16/14  4:42 PM      Result Value Ref Range   Glucose-Capillary 94  70 - 99 mg/dL  CBC     Status: Abnormal   Collection Time    01/17/14  5:33 AM      Result Value Ref Range   WBC 7.3  4.0 - 10.5 K/uL   RBC 4.22  3.87 - 5.11 MIL/uL   Hemoglobin 10.3 (*)  12.0 - 15.0 g/dL   HCT 33.1 (*) 36.0 - 46.0 %   MCV 78.4  78.0 - 100.0 fL   MCH 24.4 (*) 26.0 - 34.0 pg   MCHC 31.1  30.0 - 36.0 g/dL   RDW 16.2 (*) 11.5 - 15.5 %   Platelets 318  150 - 400 K/uL  GLUCOSE, CAPILLARY     Status: None   Collection Time    01/18/14  7:17 AM      Result Value Ref Range   Glucose-Capillary 99  70 - 99 mg/dL   Comment 1 Notify RN        Head: Normocephalic and atraumatic.  Eyes: Conjunctivae are normal. Pupils are equal, round, and reactive to light.  Cardiovascular: An irregularly irregular rhythm present.  Respiratory: Effort normal. No respiratory distress. She has some crackles in the left base. She has no rhonchi or wheezes today Has paroxsymal episodic productive coughing spells during conversation.  GI: Soft. Bowel sounds are normal. She exhibits no distension. There is no tenderness.  Musculoskeletal: She exhibits edema (1+ pitting edema BLE tibially). Head forward, kyphotic posture. Resting tremor RUE (tardive dyskinesia)  Neurological: She is alert and oriented to person, place, and time.  Pleasant and appropriate. Follows commands without difficulty.  Skin: Skin is warm and dry.  Large bruise upper mid thoracic spine.  Psychiatric: She has a normal mood and affect. Her behavior is normal. Thought content normal.  Motor strength is 4/5 bilateral deltoid, bicep, tricep, grip is 3+ to 4-, hip flexor, knee extensor, ankle dorsiflexor and plantar flexor. Tremors BUE, R>L. No sensory loss.   Assessment/Plan: 1. Functional deficits secondary to deconditioning after pneumonia, and multiple medical issues. Recent fall with trunk,soft tissue trauma  which require 3+ hours per day of interdisciplinary therapy in a comprehensive inpatient rehab setting. Physiatrist is providing close team supervision and 24 hour management of active medical problems listed below. Physiatrist and rehab team continue to assess barriers to discharge/monitor patient progress  toward functional and medical goals.   FIM: FIM - Bathing Bathing  Steps Patient Completed: Chest;Right Arm;Left Arm;Abdomen;Front perineal area;Right upper leg;Left upper leg;Right lower leg (including foot);Left lower leg (including foot);Buttocks Bathing: 6: Assistive device (Comment)  FIM - Upper Body Dressing/Undressing Upper body dressing/undressing steps patient completed: Thread/unthread right sleeve of pullover shirt/dresss;Thread/unthread left sleeve of pullover shirt/dress;Put head through opening of pull over shirt/dress;Thread/unthread right bra strap;Thread/unthread left bra strap;Hook/unhook bra;Pull shirt over trunk Upper body dressing/undressing: 6: More than reasonable amount of time FIM - Lower Body Dressing/Undressing Lower body dressing/undressing steps patient completed: Thread/unthread right pants leg;Thread/unthread left pants leg;Don/Doff right sock;Don/Doff left sock;Don/Doff left shoe;Don/Doff right shoe;Thread/unthread right underwear leg;Thread/unthread left underwear leg;Pull underwear up/down;Pull pants up/down Lower body dressing/undressing: 6: More than reasonable amount of time  FIM - Toileting Toileting steps completed by patient: Adjust clothing prior to toileting;Performs perineal hygiene;Adjust clothing after toileting Toileting Assistive Devices: Grab bar or rail for support Toileting: 6: Assistive device: No helper  FIM - Radio producer Devices: Insurance account manager Transfers: 6-Assistive device: No helper;6-From toilet/BSC;6-To toilet/ BSC  FIM - Control and instrumentation engineer Devices: Environmental consultant;Arm rests Bed/Chair Transfer: 6: Supine > Sit: No assist;6: Sit > Supine: No assist;6: Bed > Chair or W/C: No assist;6: Chair or W/C > Bed: No assist  FIM - Locomotion: Wheelchair Locomotion: Wheelchair: 0: Activity did not occur FIM - Locomotion: Ambulation Locomotion: Ambulation Assistive Devices: Astronomer Ambulation/Gait Assistance: 6: Modified independent (Device/Increase time) Locomotion: Ambulation: 5: Household Independent - travels 62 - 149 ft independent or modified independent  Comprehension Comprehension Mode: Auditory Comprehension: 6-Follows complex conversation/direction: With extra time/assistive device  Expression Expression Mode: Verbal Expression: 6-Expresses complex ideas: With extra time/assistive device  Social Interaction Social Interaction: 7-Interacts appropriately with others - No medications needed.  Problem Solving Problem Solving: 6-Solves complex problems: With extra time  Memory Memory: 6-More than reasonable amt of time  Medical Problem List and Plan:  1. Functional deficits secondary to deconditioning after pneumonia, and multiple medical issues. Recent fall with trunk,soft tissue trauma   -relative weakness of grip. Don't see any other focal neuro signs. CT of neck negative for acute injury 2. DVT Prophylaxis/Anticoagulation: Mechanical: Antiembolism stockings, knee (TED hose) Bilateral lower extremities  Sequential compression devices, below knee Bilateral lower extremities  3. Pain Management: Will continue tylenol tid schedule with tramadol prn for moderate to severe pain.  4. Mood: Has a positive outlook. Will have LCSW follow for evaluation and support.  5. Neuropsych: This patient is capable of making decisions on her own behalf.  6. Recurrent Aspiration PNA/E coli UTI: resolved   7. Leucocytosis:  Overall improved---recheck once more tomorrow  8. A fib: Now on amiodarone as well as digoxin for rate control  -bmet ok  9. Chronic cough: will schedule Tessalon perles. Use tussionex bid prn severe cough.  10 Esophageal cancer s/p resection with severe GERD/duodenal stent: Continue home regimen of PPI bid, Carafate and Reglan (held for now now).  11. Diarrhea: stopped antibiotics, hold reglan for now 12. Chronic bronchitis with asthma:  Continue nebs bid.  continue Mucinex DM as well as IS while awake.   -continue nebs, meds at home. At baseline  -cxr stable to improved yesterday 13.  Mild hypoK likely related to loose stools , supplemented 14. Urinary frequency--low pvr's  -added LOW dose ditropan which seems to have helped a little--home rx---may need to increase further  -ucx pending still   LOS (Days) 9 A FACE TO FACE EVALUATION WAS PERFORMED  SWARTZ,ZACHARY T 01/18/2014, 7:58 AM

## 2014-01-19 ENCOUNTER — Inpatient Hospital Stay (HOSPITAL_COMMUNITY): Payer: Medicare Other | Admitting: Physical Therapy

## 2014-01-20 ENCOUNTER — Telehealth: Payer: Self-pay | Admitting: Family

## 2014-01-20 NOTE — Progress Notes (Addendum)
Social Work  Discharge Note  The overall goal for the admission was met for:   Discharge location: Yes - home alone with intermittent assistance from family  Length of Stay: Yes - 9 days  Discharge activity level: Yes - mod independent  Home/community participation: Yes  Services provided included: MD, RD, PT, OT, RN, TR, Pharmacy and [place]: Medicare and Private Insurance: [place]  Follow-up services arranged: Home Health: Therapist, sports, PT, OT, SW, bath aide via [place], DME: youth rolling walker via [place] and Patient/Family has no preference for HH/DME agencies  Comments (or additional information):  Patient/Family verbalized understanding of follow-up arrangements: Yes  Individual responsible for coordination of the follow-up plan: patient  Confirmed correct DME delivered: Kunio Cummiskey 01/20/2014    Joyanne Eddinger

## 2014-01-20 NOTE — Telephone Encounter (Signed)
Pt's daughter states that her mom has to be seen prior to her appt on 02/06/14, pt would like for padonda to recommend another pcp for her to see or would like to know if padonda can give her a 15 min appt. I explained to the daughter that the appt has to be a 30 min appt. Daughter states the pt has to be seen before 7/9 but not later. Daughter states there are some immediate needs that need to be address so they can not wait any longer than 02/06/14 and they will not have anyone to bring her on that day.

## 2014-01-20 NOTE — Telephone Encounter (Signed)
Please call pt's daughter to schedule appt for 01/29/14. You may put 2 71min slots together please

## 2014-01-21 NOTE — Telephone Encounter (Signed)
appt scheduled for 7/1

## 2014-01-22 ENCOUNTER — Telehealth: Payer: Self-pay | Admitting: Family

## 2014-01-22 ENCOUNTER — Other Ambulatory Visit: Payer: Self-pay | Admitting: *Deleted

## 2014-01-22 ENCOUNTER — Telehealth (HOSPITAL_BASED_OUTPATIENT_CLINIC_OR_DEPARTMENT_OTHER): Payer: Self-pay

## 2014-01-22 ENCOUNTER — Encounter (HOSPITAL_COMMUNITY): Payer: Self-pay | Admitting: Emergency Medicine

## 2014-01-22 ENCOUNTER — Inpatient Hospital Stay (HOSPITAL_COMMUNITY)
Admission: EM | Admit: 2014-01-22 | Discharge: 2014-01-27 | DRG: 871 | Disposition: A | Discharge status: Skilled Nursing Facility | Payer: Medicare Other | Attending: Internal Medicine | Admitting: Internal Medicine

## 2014-01-22 ENCOUNTER — Emergency Department (HOSPITAL_COMMUNITY): Payer: Medicare Other

## 2014-01-22 DIAGNOSIS — M1710 Unilateral primary osteoarthritis, unspecified knee: Secondary | ICD-10-CM | POA: Diagnosis present

## 2014-01-22 DIAGNOSIS — I4891 Unspecified atrial fibrillation: Secondary | ICD-10-CM

## 2014-01-22 DIAGNOSIS — D62 Acute posthemorrhagic anemia: Secondary | ICD-10-CM

## 2014-01-22 DIAGNOSIS — K219 Gastro-esophageal reflux disease without esophagitis: Secondary | ICD-10-CM | POA: Diagnosis present

## 2014-01-22 DIAGNOSIS — Z825 Family history of asthma and other chronic lower respiratory diseases: Secondary | ICD-10-CM

## 2014-01-22 DIAGNOSIS — J189 Pneumonia, unspecified organism: Secondary | ICD-10-CM | POA: Insufficient documentation

## 2014-01-22 DIAGNOSIS — R251 Tremor, unspecified: Secondary | ICD-10-CM

## 2014-01-22 DIAGNOSIS — R2689 Other abnormalities of gait and mobility: Secondary | ICD-10-CM

## 2014-01-22 DIAGNOSIS — M545 Low back pain, unspecified: Secondary | ICD-10-CM | POA: Diagnosis present

## 2014-01-22 DIAGNOSIS — IMO0002 Reserved for concepts with insufficient information to code with codable children: Secondary | ICD-10-CM

## 2014-01-22 DIAGNOSIS — M810 Age-related osteoporosis without current pathological fracture: Secondary | ICD-10-CM | POA: Diagnosis present

## 2014-01-22 DIAGNOSIS — Z8249 Family history of ischemic heart disease and other diseases of the circulatory system: Secondary | ICD-10-CM

## 2014-01-22 DIAGNOSIS — I5032 Chronic diastolic (congestive) heart failure: Secondary | ICD-10-CM | POA: Diagnosis present

## 2014-01-22 DIAGNOSIS — Z85828 Personal history of other malignant neoplasm of skin: Secondary | ICD-10-CM

## 2014-01-22 DIAGNOSIS — D509 Iron deficiency anemia, unspecified: Secondary | ICD-10-CM | POA: Diagnosis present

## 2014-01-22 DIAGNOSIS — Z800 Family history of malignant neoplasm of digestive organs: Secondary | ICD-10-CM

## 2014-01-22 DIAGNOSIS — J45909 Unspecified asthma, uncomplicated: Secondary | ICD-10-CM | POA: Diagnosis present

## 2014-01-22 DIAGNOSIS — Y95 Nosocomial condition: Secondary | ICD-10-CM

## 2014-01-22 DIAGNOSIS — G8929 Other chronic pain: Secondary | ICD-10-CM | POA: Diagnosis present

## 2014-01-22 DIAGNOSIS — K3184 Gastroparesis: Secondary | ICD-10-CM | POA: Diagnosis present

## 2014-01-22 DIAGNOSIS — E871 Hypo-osmolality and hyponatremia: Secondary | ICD-10-CM

## 2014-01-22 DIAGNOSIS — K222 Esophageal obstruction: Secondary | ICD-10-CM

## 2014-01-22 DIAGNOSIS — A419 Sepsis, unspecified organism: Principal | ICD-10-CM | POA: Diagnosis present

## 2014-01-22 DIAGNOSIS — I272 Pulmonary hypertension, unspecified: Secondary | ICD-10-CM

## 2014-01-22 DIAGNOSIS — Z9889 Other specified postprocedural states: Secondary | ICD-10-CM

## 2014-01-22 DIAGNOSIS — I10 Essential (primary) hypertension: Secondary | ICD-10-CM | POA: Diagnosis present

## 2014-01-22 DIAGNOSIS — Z7901 Long term (current) use of anticoagulants: Secondary | ICD-10-CM

## 2014-01-22 DIAGNOSIS — R1310 Dysphagia, unspecified: Secondary | ICD-10-CM | POA: Diagnosis present

## 2014-01-22 DIAGNOSIS — Z881 Allergy status to other antibiotic agents status: Secondary | ICD-10-CM

## 2014-01-22 DIAGNOSIS — J690 Pneumonitis due to inhalation of food and vomit: Secondary | ICD-10-CM | POA: Diagnosis present

## 2014-01-22 DIAGNOSIS — I482 Chronic atrial fibrillation, unspecified: Secondary | ICD-10-CM

## 2014-01-22 DIAGNOSIS — Z5181 Encounter for therapeutic drug level monitoring: Secondary | ICD-10-CM

## 2014-01-22 DIAGNOSIS — J181 Lobar pneumonia, unspecified organism: Secondary | ICD-10-CM

## 2014-01-22 DIAGNOSIS — Z8501 Personal history of malignant neoplasm of esophagus: Secondary | ICD-10-CM

## 2014-01-22 DIAGNOSIS — Z9049 Acquired absence of other specified parts of digestive tract: Secondary | ICD-10-CM

## 2014-01-22 DIAGNOSIS — I351 Nonrheumatic aortic (valve) insufficiency: Secondary | ICD-10-CM

## 2014-01-22 DIAGNOSIS — R609 Edema, unspecified: Secondary | ICD-10-CM

## 2014-01-22 DIAGNOSIS — I509 Heart failure, unspecified: Secondary | ICD-10-CM | POA: Diagnosis present

## 2014-01-22 DIAGNOSIS — K311 Adult hypertrophic pyloric stenosis: Secondary | ICD-10-CM

## 2014-01-22 DIAGNOSIS — Z888 Allergy status to other drugs, medicaments and biological substances status: Secondary | ICD-10-CM

## 2014-01-22 DIAGNOSIS — Z8049 Family history of malignant neoplasm of other genital organs: Secondary | ICD-10-CM

## 2014-01-22 DIAGNOSIS — R042 Hemoptysis: Secondary | ICD-10-CM

## 2014-01-22 HISTORY — DX: Personal history of other diseases of the digestive system: Z87.19

## 2014-01-22 HISTORY — DX: Low back pain, unspecified: M54.50

## 2014-01-22 HISTORY — DX: Pneumonitis due to inhalation of food and vomit: J69.0

## 2014-01-22 HISTORY — DX: Frequency of micturition: R35.0

## 2014-01-22 HISTORY — DX: Pneumonia, unspecified organism: J18.9

## 2014-01-22 HISTORY — DX: Other chronic pain: G89.29

## 2014-01-22 HISTORY — DX: Low back pain: M54.5

## 2014-01-22 HISTORY — DX: Nosocomial condition: Y95

## 2014-01-22 LAB — CBC WITH DIFFERENTIAL/PLATELET
BASOS PCT: 0 % (ref 0–1)
Basophils Absolute: 0 10*3/uL (ref 0.0–0.1)
Eosinophils Absolute: 0.1 10*3/uL (ref 0.0–0.7)
Eosinophils Relative: 1 % (ref 0–5)
HCT: 35.7 % — ABNORMAL LOW (ref 36.0–46.0)
HEMOGLOBIN: 11.2 g/dL — AB (ref 12.0–15.0)
LYMPHS ABS: 1.6 10*3/uL (ref 0.7–4.0)
LYMPHS PCT: 9 % — AB (ref 12–46)
MCH: 24.3 pg — ABNORMAL LOW (ref 26.0–34.0)
MCHC: 31.4 g/dL (ref 30.0–36.0)
MCV: 77.6 fL — ABNORMAL LOW (ref 78.0–100.0)
Monocytes Absolute: 1.5 10*3/uL — ABNORMAL HIGH (ref 0.1–1.0)
Monocytes Relative: 9 % (ref 3–12)
NEUTROS PCT: 81 % — AB (ref 43–77)
Neutro Abs: 14.3 10*3/uL — ABNORMAL HIGH (ref 1.7–7.7)
Platelets: 405 10*3/uL — ABNORMAL HIGH (ref 150–400)
RBC: 4.6 MIL/uL (ref 3.87–5.11)
RDW: 16.7 % — ABNORMAL HIGH (ref 11.5–15.5)
WBC: 17.4 10*3/uL — AB (ref 4.0–10.5)

## 2014-01-22 LAB — URINE CULTURE

## 2014-01-22 LAB — URINALYSIS, ROUTINE W REFLEX MICROSCOPIC
Bilirubin Urine: NEGATIVE
GLUCOSE, UA: NEGATIVE mg/dL
HGB URINE DIPSTICK: NEGATIVE
KETONES UR: NEGATIVE mg/dL
Nitrite: NEGATIVE
Protein, ur: NEGATIVE mg/dL
Specific Gravity, Urine: 1.011 (ref 1.005–1.030)
Urobilinogen, UA: 0.2 mg/dL (ref 0.0–1.0)
pH: 8 (ref 5.0–8.0)

## 2014-01-22 LAB — COMPREHENSIVE METABOLIC PANEL
ALBUMIN: 3.1 g/dL — AB (ref 3.5–5.2)
ALT: 13 U/L (ref 0–35)
AST: 21 U/L (ref 0–37)
Alkaline Phosphatase: 85 U/L (ref 39–117)
BUN: 9 mg/dL (ref 6–23)
CHLORIDE: 91 meq/L — AB (ref 96–112)
CO2: 28 mEq/L (ref 19–32)
Calcium: 9.4 mg/dL (ref 8.4–10.5)
Creatinine, Ser: 0.75 mg/dL (ref 0.50–1.10)
GFR calc Af Amer: 89 mL/min — ABNORMAL LOW (ref >90)
GFR calc non Af Amer: 77 mL/min — ABNORMAL LOW (ref >90)
GLUCOSE: 115 mg/dL — AB (ref 70–99)
Potassium: 3.9 mEq/L (ref 3.7–5.3)
Sodium: 133 mEq/L — ABNORMAL LOW (ref 137–147)
Total Bilirubin: 0.5 mg/dL (ref 0.3–1.2)
Total Protein: 6 g/dL (ref 6.0–8.3)

## 2014-01-22 LAB — URINE MICROSCOPIC-ADD ON

## 2014-01-22 LAB — I-STAT CG4 LACTIC ACID, ED: Lactic Acid, Venous: 0.98 mmol/L (ref 0.5–2.2)

## 2014-01-22 LAB — TROPONIN I: Troponin I: <0.3 ng/mL (ref <0.30)

## 2014-01-22 MED ORDER — METHYLPREDNISOLONE SODIUM SUCC 125 MG IJ SOLR
60.0000 mg | Freq: Once | INTRAMUSCULAR | Status: AC
  Administered 2014-01-22: 60 mg via INTRAVENOUS
  Filled 2014-01-22: qty 2

## 2014-01-22 MED ORDER — ARFORMOTEROL TARTRATE 15 MCG/2ML IN NEBU
15.0000 ug | INHALATION_SOLUTION | Freq: Two times a day (BID) | RESPIRATORY_TRACT | Status: DC
  Administered 2014-01-22 – 2014-01-27 (×9): 15 ug via RESPIRATORY_TRACT
  Filled 2014-01-22 (×11): qty 2

## 2014-01-22 MED ORDER — DEXTROSE 5 % IV SOLN
1.0000 g | Freq: Once | INTRAVENOUS | Status: DC
  Filled 2014-01-22: qty 1

## 2014-01-22 MED ORDER — PANTOPRAZOLE SODIUM 40 MG PO TBEC
40.0000 mg | DELAYED_RELEASE_TABLET | Freq: Every day | ORAL | Status: DC
  Administered 2014-01-23: 40 mg via ORAL
  Filled 2014-01-22: qty 1

## 2014-01-22 MED ORDER — HYDROCOD POLST-CHLORPHEN POLST 10-8 MG/5ML PO LQCR
5.0000 mL | Freq: Two times a day (BID) | ORAL | Status: DC | PRN
  Administered 2014-01-23 – 2014-01-24 (×2): 5 mL via ORAL
  Filled 2014-01-22 (×2): qty 5

## 2014-01-22 MED ORDER — PANTOPRAZOLE SODIUM 40 MG PO TBEC
40.0000 mg | DELAYED_RELEASE_TABLET | Freq: Once | ORAL | Status: AC
  Administered 2014-01-22: 40 mg via ORAL
  Filled 2014-01-22: qty 1

## 2014-01-22 MED ORDER — OXYBUTYNIN CHLORIDE 5 MG PO TABS
2.5000 mg | ORAL_TABLET | Freq: Two times a day (BID) | ORAL | Status: DC
  Administered 2014-01-22 – 2014-01-25 (×6): 2.5 mg via ORAL
  Filled 2014-01-22 (×7): qty 0.5

## 2014-01-22 MED ORDER — BUDESONIDE 0.25 MG/2ML IN SUSP
0.2500 mg | Freq: Two times a day (BID) | RESPIRATORY_TRACT | Status: DC
  Administered 2014-01-22 – 2014-01-27 (×9): 0.25 mg via RESPIRATORY_TRACT
  Filled 2014-01-22 (×12): qty 2

## 2014-01-22 MED ORDER — SUCRALFATE 1 GM/10ML PO SUSP
1.0000 g | Freq: Two times a day (BID) | ORAL | Status: DC
  Administered 2014-01-23 – 2014-01-27 (×9): 1 g via ORAL
  Filled 2014-01-22 (×13): qty 10

## 2014-01-22 MED ORDER — SODIUM CHLORIDE 0.9 % IV SOLN
INTRAVENOUS | Status: DC
  Administered 2014-01-22: via INTRAVENOUS

## 2014-01-22 MED ORDER — VANCOMYCIN HCL IN DEXTROSE 1-5 GM/200ML-% IV SOLN
1000.0000 mg | Freq: Once | INTRAVENOUS | Status: AC
  Administered 2014-01-22: 1000 mg via INTRAVENOUS
  Filled 2014-01-22: qty 200

## 2014-01-22 MED ORDER — VANCOMYCIN HCL 500 MG IV SOLR
500.0000 mg | Freq: Two times a day (BID) | INTRAVENOUS | Status: DC
  Filled 2014-01-22: qty 500

## 2014-01-22 MED ORDER — TRAMADOL HCL 50 MG PO TABS
50.0000 mg | ORAL_TABLET | Freq: Three times a day (TID) | ORAL | Status: DC | PRN
  Administered 2014-01-23 – 2014-01-27 (×6): 50 mg via ORAL
  Filled 2014-01-22 (×6): qty 1

## 2014-01-22 MED ORDER — ZOLPIDEM TARTRATE 5 MG PO TABS
5.0000 mg | ORAL_TABLET | Freq: Every day | ORAL | Status: DC
  Administered 2014-01-22 – 2014-01-26 (×5): 5 mg via ORAL
  Filled 2014-01-22 (×5): qty 1

## 2014-01-22 MED ORDER — ALBUTEROL SULFATE (2.5 MG/3ML) 0.083% IN NEBU
5.0000 mg | INHALATION_SOLUTION | Freq: Once | RESPIRATORY_TRACT | Status: AC
  Administered 2014-01-22: 5 mg via RESPIRATORY_TRACT
  Filled 2014-01-22: qty 6

## 2014-01-22 MED ORDER — OLOPATADINE HCL 0.1 % OP SOLN
1.0000 [drp] | Freq: Two times a day (BID) | OPHTHALMIC | Status: DC
  Administered 2014-01-22 – 2014-01-27 (×10): 1 [drp] via OPHTHALMIC
  Filled 2014-01-22 (×3): qty 5

## 2014-01-22 MED ORDER — HEPARIN SODIUM (PORCINE) 5000 UNIT/ML IJ SOLN
5000.0000 [IU] | Freq: Three times a day (TID) | INTRAMUSCULAR | Status: DC
  Administered 2014-01-22 – 2014-01-27 (×13): 5000 [IU] via SUBCUTANEOUS
  Filled 2014-01-22 (×17): qty 1

## 2014-01-22 MED ORDER — FUROSEMIDE 40 MG PO TABS
40.0000 mg | ORAL_TABLET | Freq: Every day | ORAL | Status: DC
  Administered 2014-01-23: 40 mg via ORAL
  Filled 2014-01-22: qty 1

## 2014-01-22 MED ORDER — PROMETHAZINE HCL 25 MG RE SUPP
25.0000 mg | Freq: Four times a day (QID) | RECTAL | Status: DC | PRN
  Administered 2014-01-25: 25 mg via RECTAL
  Filled 2014-01-22 (×3): qty 1

## 2014-01-22 MED ORDER — AMIODARONE HCL 200 MG PO TABS
200.0000 mg | ORAL_TABLET | Freq: Every day | ORAL | Status: DC
  Administered 2014-01-23 – 2014-01-27 (×5): 200 mg via ORAL
  Filled 2014-01-22 (×5): qty 1

## 2014-01-22 MED ORDER — ALBUTEROL SULFATE (2.5 MG/3ML) 0.083% IN NEBU: 2.5000 mg | INHALATION_SOLUTION | RESPIRATORY_TRACT | Status: DC | PRN

## 2014-01-22 MED ORDER — TRAMADOL HCL 50 MG PO TABS
50.0000 mg | ORAL_TABLET | Freq: Once | ORAL | Status: AC
  Administered 2014-01-22: 50 mg via ORAL
  Filled 2014-01-22: qty 1

## 2014-01-22 MED ORDER — CEFUROXIME AXETIL 500 MG PO TABS: 500.0000 mg | ORAL_TABLET | Freq: Two times a day (BID) | ORAL | Status: DC

## 2014-01-22 NOTE — ED Notes (Signed)
RN attempted to draw labs in triage. Unable. Pt states recent PICC line due to inability to obtain peripheral access, phlebotomy called.

## 2014-01-22 NOTE — ED Provider Notes (Signed)
Medical screening examination/treatment/procedure(s) were conducted as a shared visit with non-physician practitioner(s) and myself.  I personally evaluated the patient during the encounter.   EKG Interpretation   Date/Time:  Wednesday January 22 2014 14:43:43 EDT Ventricular Rate:  88 PR Interval:  169 QRS Duration: 76 QT Interval:  357 QTC Calculation: 432 R Axis:   -14 Text Interpretation:  Sinus rhythm Probable left atrial enlargement  Consider anterior infarct Minimal ST depression, lateral leads NO  SIGNIFICANT CHANGE SINCE LAST TRACING YESTERDAY Confirmed by Rogene Houston   MD, SCOTT [id number]) on 01/22/2014 2:47:24 PM       Results for orders placed during the hospital encounter of 01/22/14  CBC WITH DIFFERENTIAL      Result Value Ref Range   WBC 17.4 (*) 4.0 - 10.5 K/uL   RBC 4.60  3.87 - 5.11 MIL/uL   Hemoglobin 11.2 (*) 12.0 - 15.0 g/dL   HCT 35.7 (*) 36.0 - 46.0 %   MCV 77.6 (*) 78.0 - 100.0 fL   MCH 24.3 (*) 26.0 - 34.0 pg   MCHC 31.4  30.0 - 36.0 g/dL   RDW 16.7 (*) 11.5 - 15.5 %   Platelets 405 (*) 150 - 400 K/uL   Neutrophils Relative % 81 (*) 43 - 77 %   Neutro Abs 14.3 (*) 1.7 - 7.7 K/uL   Lymphocytes Relative 9 (*) 12 - 46 %   Lymphs Abs 1.6  0.7 - 4.0 K/uL   Monocytes Relative 9  3 - 12 %   Monocytes Absolute 1.5 (*) 0.1 - 1.0 K/uL   Eosinophils Relative 1  0 - 5 %   Eosinophils Absolute 0.1  0.0 - 0.7 K/uL   Basophils Relative 0  0 - 1 %   Basophils Absolute 0.0  0.0 - 0.1 K/uL  COMPREHENSIVE METABOLIC PANEL      Result Value Ref Range   Sodium 133 (*) 137 - 147 mEq/L   Potassium 3.9  3.7 - 5.3 mEq/L   Chloride 91 (*) 96 - 112 mEq/L   CO2 28  19 - 32 mEq/L   Glucose, Bld 115 (*) 70 - 99 mg/dL   BUN 9  6 - 23 mg/dL   Creatinine, Ser 0.75  0.50 - 1.10 mg/dL   Calcium 9.4  8.4 - 10.5 mg/dL   Total Protein 6.0  6.0 - 8.3 g/dL   Albumin 3.1 (*) 3.5 - 5.2 g/dL   AST 21  0 - 37 U/L   ALT 13  0 - 35 U/L   Alkaline Phosphatase 85  39 - 117 U/L   Total  Bilirubin 0.5  0.3 - 1.2 mg/dL   GFR calc non Af Amer 77 (*) >90 mL/min   GFR calc Af Amer 89 (*) >90 mL/min  URINALYSIS, ROUTINE W REFLEX MICROSCOPIC      Result Value Ref Range   Color, Urine YELLOW  YELLOW   APPearance CLEAR  CLEAR   Specific Gravity, Urine 1.011  1.005 - 1.030   pH 8.0  5.0 - 8.0   Glucose, UA NEGATIVE  NEGATIVE mg/dL   Hgb urine dipstick NEGATIVE  NEGATIVE   Bilirubin Urine NEGATIVE  NEGATIVE   Ketones, ur NEGATIVE  NEGATIVE mg/dL   Protein, ur NEGATIVE  NEGATIVE mg/dL   Urobilinogen, UA 0.2  0.0 - 1.0 mg/dL   Nitrite NEGATIVE  NEGATIVE   Leukocytes, UA TRACE (*) NEGATIVE  TROPONIN I      Result Value Ref Range   Troponin I <0.30  <  0.30 ng/mL  URINE MICROSCOPIC-ADD ON      Result Value Ref Range   Squamous Epithelial / LPF RARE  RARE   WBC, UA 0-2  <3 WBC/hpf   RBC / HPF 0-2  <3 RBC/hpf   Bacteria, UA RARE  RARE  I-STAT CG4 LACTIC ACID, ED      Result Value Ref Range   Lactic Acid, Venous 0.98  0.5 - 2.2 mmol/L   Dg Chest 2 View  01/22/2014   CLINICAL DATA:  Fever  EXAM: CHEST  2 VIEW  COMPARISON:  01/17/2014  FINDINGS: Esophagectomy and gastric pull through. Gastric stent unchanged in position.  Mild left lower lobe atelectasis/ infiltrate is unchanged from the prior study. Mild right lower lobe atelectasis unchanged. No definite pneumonia or effusion. Negative for heart failure.  IMPRESSION: Bibasilar atelectasis unchanged.  No definite pneumonia.   Electronically Signed   By: Franchot Gallo M.D.   On: 01/22/2014 13:49   Dg Chest 2 View  01/17/2014   CLINICAL DATA:  Persistent cough  EXAM: CHEST  2 VIEW  COMPARISON:  01/15/2014  FINDINGS: Cardiomediastinal silhouette is stable. Gastroesophageal stent again noted. No pulmonary edema. There is persistent streaky atelectasis, scarring or infiltrate in left lower lobe silhouetting the left heart border. Stable right PICC line.  IMPRESSION: No pulmonary edema. There is persistent streaky atelectasis, scarring or  infiltrate in left lower lobe silhouetting the left heart border. Stable right PICC line.   Electronically Signed   By: Lahoma Crocker M.D.   On: 01/17/2014 12:25   Dg Chest 2 View  01/15/2014   CLINICAL DATA:  Pneumonia.  EXAM: CHEST  2 VIEW  COMPARISON:  January 10, 2014.  FINDINGS: Stable cardiomegaly. Stable left basilar density is noted consistent with postsurgical changes and atelectasis. No pneumothorax is noted. Right-sided PICC line is unchanged in position distal esophageal stent is again noted. No significant pleural effusion is noted. Increased curvilinear opacity is seen in the right upper lobe which may represent subsegmental atelectasis.  IMPRESSION: Stable left basilar density is noted consistent with atelectasis and postsurgical changes. New curvilinear density is noted in right upper lobe consistent with subsegmental atelectasis.   Electronically Signed   By: Sabino Dick M.D.   On: 01/15/2014 10:16   Dg Chest 2 View  01/10/2014   CLINICAL DATA:  Pneumonia.  EXAM: CHEST  2 VIEW  COMPARISON:  01/08/2014  FINDINGS: The heart is enlarged but stable. Stable surgical changes with an esophageal stent in place. The right PICC line is stable. There is persistent left lower lobe opacity most of which is account for by the gastric pull-through and adjacent atelectasis. A small effusion is also probable.  IMPRESSION: Stable surgical changes related to prior gastric pull-through and esophageal stent. There is persistent atelectasis around the stomach and a probable small left pleural effusion.   Electronically Signed   By: Kalman Jewels M.D.   On: 01/10/2014 13:31   Dg Chest 2 View  01/06/2014   CLINICAL DATA:  Fall with pain in the mid to lower back.  EXAM: CHEST  2 VIEW  COMPARISON:  12/20/2013.  CT chest 09/12/2013.  FINDINGS: Surgical clips in the expected location of the thyroid. Trachea is midline. Heart is enlarged. Thoracic aorta is calcified. A long wall stent projects over the lower thoracic spine  and traverses a gastric pull-through when compared with 09/12/2013. Patchy left basilar airspace disease. Small left pleural effusion. Osteopenia without definite compression fracture.  IMPRESSION: 1. Patchy left  basilar airspace disease may be due to pneumonia. 2. Small left pleural effusion. 3. Osteopenia without definite fracture.   Electronically Signed   By: Lorin Picket M.D.   On: 01/06/2014 13:36   Dg Thoracic Spine 2 View  01/06/2014   CLINICAL DATA:  Fall.  Back pain.  EXAM: THORACIC SPINE - 2 VIEW  COMPARISON:  Lateral chest radiograph 12/20/2013  FINDINGS: No fracture. No spondylolisthesis. There are mild disc degenerative changes along the mid lower thoracic spine. Bones are diffusely demineralized. Vena cava stent extends from the mid chest to the central upper abdomen.  IMPRESSION: No fracture or acute finding.   Electronically Signed   By: Lajean Manes M.D.   On: 01/06/2014 13:35   Dg Lumbar Spine Complete  01/06/2014   CLINICAL DATA:  80 year old female status post fall with pain. Initial encounter. History of esophagectomy with gastric pull-through and stenting  EXAM: LUMBAR SPINE - COMPLETE 4+ VIEW  COMPARISON:  Abdominal 11/17/2011.  Chest CT 09/12/2013.  FINDINGS: Metallic stents in the distal stomach and right upper quadrant Stable from the recent CT. Stable right upper quadrant surgical clips. Multiple surgical clips projecting at the mediastinum and left lung base re- identified. Transitional lumbosacral anatomy, suspect L5 level being mostly sacralized. Anterolisthesis of L4 on L5 measuring up to 9 mm. Moderate to severe lower lumbar facet hypertrophy and degeneration. Relatively preserved disc spaces. Elsewhere lumbar vertebral height and alignment within normal limits. Grossly intact visualized lower thoracic levels. Sacral ala and SI joints within normal limits. Sclerosis at the pubic symphysis probably is degenerative.  IMPRESSION: 1. Transitional lumbosacral anatomy suspected  with sacralized L5 level. Grade 1-2 anterolisthesis of L4 on L5 with advanced facet degeneration. 2.  No acute osseous abnormality identified in the lumbar spine. 3. Postoperative changes to the mediastinum with gastric pull-through metal stent extending into the right upper quadrant.   Electronically Signed   By: Lars Pinks M.D.   On: 01/06/2014 13:39   Ct Head Wo Contrast  01/06/2014   CLINICAL DATA:  Found down.  Fell in bathtub.  EXAM: CT HEAD WITHOUT CONTRAST  CT CERVICAL SPINE WITHOUT CONTRAST  TECHNIQUE: Multidetector CT imaging of the head and cervical spine was performed following the standard protocol without intravenous contrast. Multiplanar CT image reconstructions of the cervical spine were also generated.  COMPARISON:  01/15/2013 head CT. Scout view of the paranasal sinus CT 01/15/2013. Brain MR 08/17/2004.  FINDINGS: CT HEAD FINDINGS  No skull fracture or intracranial hemorrhage. Small vessel disease type changes without CT evidence of large acute infarct. Partial opacification right sphenoid sinus air cell and upper aspect of the right pterygoid plate containing hyperdense material which may represent inspissated material. No intracranial mass lesion noted on this unenhanced exam. No hydrocephalus.  CT CERVICAL SPINE FINDINGS  No cervical spine fracture noted.  Minimal anterior slip of C3 and C4 felt to be related to facet joint degenerative changes.  Minimal anterior widening of the posterior aspect of the left C5-6 and C6-7 facet joint. This may be normal for this patient rather than related to injury however if there was a high clinical suspicion of ligamentous injury, MR or flexion and extension views could be obtained for further delineation.  C5-6 and C6-7 disc degeneration with broad-based spur causing mild spinal stenosis and cord flattening.  No abnormal prevertebral soft tissue swelling.  Dilated esophagus. Patient has an esophageal stent in place and this may be related to treatment of  tumor although incompletely assessed.  IMPRESSION:  CT head:  No skull fracture or intracranial hemorrhage.  Small vessel disease type changes without CT evidence of large acute infarct.  Partial opacification right sphenoid sinus air cell and upper aspect of the right pterygoid plate containing hyperdense material which may represent inspissated material.  CT cervical spine:  No cervical spine fracture noted.  Minimal anterior slip of C3 and C4 felt to be related to facet joint degenerative changes.  Minimal anterior widening of the posterior aspect of the left C5-6 and C6-7 facet joint. This may be normal for this patient rather than related to injury however if there was a high clinical suspicion of ligamentous injury, MR or flexion and extension views could be obtained for further delineation.  C5-6 and C6-7 disc degeneration with broad-based spur causing mild spinal stenosis and cord flattening.  No abnormal prevertebral soft tissue swelling.  Dilated esophagus. Patient has an esophageal stent in place and this may be related to treatment of tumor although incompletely assessed.   Electronically Signed   By: Chauncey Cruel M.D.   On: 01/06/2014 14:05   Ct Cervical Spine Wo Contrast  01/06/2014   CLINICAL DATA:  Found down.  Fell in bathtub.  EXAM: CT HEAD WITHOUT CONTRAST  CT CERVICAL SPINE WITHOUT CONTRAST  TECHNIQUE: Multidetector CT imaging of the head and cervical spine was performed following the standard protocol without intravenous contrast. Multiplanar CT image reconstructions of the cervical spine were also generated.  COMPARISON:  01/15/2013 head CT. Scout view of the paranasal sinus CT 01/15/2013. Brain MR 08/17/2004.  FINDINGS: CT HEAD FINDINGS  No skull fracture or intracranial hemorrhage. Small vessel disease type changes without CT evidence of large acute infarct. Partial opacification right sphenoid sinus air cell and upper aspect of the right pterygoid plate containing hyperdense material which  may represent inspissated material. No intracranial mass lesion noted on this unenhanced exam. No hydrocephalus.  CT CERVICAL SPINE FINDINGS  No cervical spine fracture noted.  Minimal anterior slip of C3 and C4 felt to be related to facet joint degenerative changes.  Minimal anterior widening of the posterior aspect of the left C5-6 and C6-7 facet joint. This may be normal for this patient rather than related to injury however if there was a high clinical suspicion of ligamentous injury, MR or flexion and extension views could be obtained for further delineation.  C5-6 and C6-7 disc degeneration with broad-based spur causing mild spinal stenosis and cord flattening.  No abnormal prevertebral soft tissue swelling.  Dilated esophagus. Patient has an esophageal stent in place and this may be related to treatment of tumor although incompletely assessed.  IMPRESSION:  CT head:  No skull fracture or intracranial hemorrhage.  Small vessel disease type changes without CT evidence of large acute infarct.  Partial opacification right sphenoid sinus air cell and upper aspect of the right pterygoid plate containing hyperdense material which may represent inspissated material.  CT cervical spine:  No cervical spine fracture noted.  Minimal anterior slip of C3 and C4 felt to be related to facet joint degenerative changes.  Minimal anterior widening of the posterior aspect of the left C5-6 and C6-7 facet joint. This may be normal for this patient rather than related to injury however if there was a high clinical suspicion of ligamentous injury, MR or flexion and extension views could be obtained for further delineation.  C5-6 and C6-7 disc degeneration with broad-based spur causing mild spinal stenosis and cord flattening.  No abnormal prevertebral soft tissue swelling.  Dilated esophagus. Patient has an esophageal stent in place and this may be related to treatment of tumor although incompletely assessed.   Electronically Signed    By: Chauncey Cruel M.D.   On: 01/06/2014 14:05   Dg Chest Port 1 View  01/08/2014   CLINICAL DATA:  PICC placement.  Recurrent atrial fibrillation.  EXAM: PORTABLE CHEST - 1 VIEW  COMPARISON:  01/06/2014  FINDINGS: PICC has been inserted and the tip is at the level of the carina in the superior vena cava in good position.  There is persistent prominence of the cardiomediastinal silhouette. The patient has a known gastric pull-through with gastroesophageal stent due to esophageal cancer.  The area of infiltrate/atelectasis at the left lung base has slightly increased and could represent pneumonia or aspiration pneumonitis.  The pulmonary vascularity is normal. Right lung is clear. No acute osseous abnormalities.  IMPRESSION: 1. PICC is in good position with the tip at the level of the carina. 2. Slight increased atelectasis/infiltrate at the left lung base as described above.   Electronically Signed   By: Rozetta Nunnery M.D.   On: 01/08/2014 15:19   Dg Swallowing Func-speech Pathology  01/13/2014   Selinda Orion Page, CCC-SLP     01/13/2014  4:33 PM   Objective Swallowing Evaluation: Modified Barium Swallowing Study   Patient Details  Name: [name] MRN: [id number] Date of Birth: [birth year]  Today's Date: 01/13/2014 Time: 0938-1829 Time Calculation (min): 41 min  Past Medical History:  Past Medical History  Diagnosis Date  . GERD (gastroesophageal reflux disease)   . Osteoporosis   . Postmenopausal HRT (hormone replacement therapy)   . Anemia   . Hypertension   . Allergy   . Asthma   . Shortness of breath   . PONV (postoperative nausea and vomiting)   . Dysrhythmia     a fib  . Esophageal cancer     removed esophagus stomach pulled up  . Basal cell carcinoma of face     "several; freezes them off"  . Pneumonia     "all the time; probably 5 times in the last year, counting  today" (01/06/2014)  . Chronic bronchitis     "practically q winter" (01/06/2014)  . History of blood transfusion 1993    "related to esophagus  removal" (01/06/2014)  . Headache(784.0)     "monthly in the past year" (01/06/2014)  . Arthritis     "legs" (01/06/2014)   Past Surgical History:  Past Surgical History  Procedure Laterality Date  . Left oophorectomy  1952  . Esophageal removal of cancer,gastric ppull-thru 1993    . Forearm fracture surgery Right ?1990's  . Esophagogastroduodenoscopy  10/19/2011    Procedure: ESOPHAGOGASTRODUODENOSCOPY (EGD);  Surgeon: Milus Banister, MD;  Location: [facility];  Service: Endoscopy;   Laterality: N/A;  . Balloon dilation  10/19/2011    Procedure: BALLOON DILATION;  Surgeon: Milus Banister, MD;   Location: [facility];  Service: Endoscopy;  Laterality: N/A;  . Esophagogastroduodenoscopy  11/07/2011    Procedure: ESOPHAGOGASTRODUODENOSCOPY (EGD);  Surgeon: Ladene Artist, MD,FACG;  Location: [facility];  Service: Endoscopy;   Laterality: N/A;  . Esophagogastroduodenoscopy  11/22/2011    Procedure: ESOPHAGOGASTRODUODENOSCOPY (EGD);  Surgeon: Inda Castle, MD;  Location: [facility];  Service: Endoscopy;   Laterality: N/A;  . Esophagogastroduodenoscopy  11/24/2011    Procedure: ESOPHAGOGASTRODUODENOSCOPY (EGD);  Surgeon: Inda Castle, MD;  Location: [facility];  Service: Endoscopy;   Laterality:  N/A;  with removable duodenal stent (actually using  esophageal partially covered 23X15 located in locked supply room  . Duodenal stent placement  11/24/2011    Procedure: DUODENAL STENT PLACEMENT;  Surgeon: Inda Castle,  MD;  Location: [facility];  Service: Endoscopy;  Laterality:  N/A;  . Video bronchoscopy Bilateral 01/18/2013    Procedure: VIDEO BRONCHOSCOPY WITHOUT FLUORO;  Surgeon: Tanda Rockers, MD;  Location: [facility];  Service: Cardiopulmonary;   Laterality: Bilateral;  . Esophagogastroduodenoscopy N/A 02/11/2013    Procedure: ESOPHAGOGASTRODUODENOSCOPY (EGD);  Surgeon: Inda Castle, MD;  Location: [facility];  Service: Endoscopy;   Laterality: N/A;  . Duodenal stent placement N/A 02/11/2013     Procedure: DUODENAL STENT PLACEMENT;  Surgeon: Inda Castle,  MD;  Location: [facility];  Service: Endoscopy;  Laterality:  N/A;  . Esophagogastroduodenoscopy N/A 09/11/2013    Procedure: ESOPHAGOGASTRODUODENOSCOPY (EGD);  Surgeon: Gatha Mayer, MD;  Location: [facility];  Service: Endoscopy;   Laterality: N/A;  . Cholecystectomy  ~ 1996  . Dilation and curettage of uterus  X 6-7  . Cataract extraction w/ intraocular lens  implant, bilateral  Bilateral ~ 2000   HPI:  [name] is an 80 y.o. female with history of A fib,  HTN, esophageal cancer s/p esophagectomy with gastric pull  through, severe GERD, gastroparesis, recurrent aspiration PNA for  past 6 months; who was found by her neighbor in her garden tub on  01/06/14. Family reported recent cold type symptoms for the past  few weeks with decrease in po intake as well as recent d/c of  coumadin due to hematemesis. CT head without acute changes.  She  was started on IV antibiotics for LLL aspiration PNA and  therapies ordered. Has had neck tilting towards the left side  since her surgery for the esophageal cancer.       Recommendation/Prognosis  Clinical Impression:   Dysphagia Diagnosis: Moderate cervical esophageal phase  dysphagia Pt presents with an overall functional oropharyngeal swallowing  mechanism.  Deviation from normal oropharyngeal physiology  included a mild delay in swallow initiation to the level of the  vallecular which did not appear to impact the overall integrity  of the swallow.   Most remarkable on this exam was evidence of  post-surgical changes from esophagectomy in 1993.  Difficult to  ascertain baseline swallowing function based on today's objective  swallow study and no history of MBS is available in chart.   Pt  has what appears to be similar to a CP bar at c5-c6 junction  resulting in moderate residue of both solids and liquids.   Residue was cleared to minimal-mild with multiple swallows and/or  thin liquid wash and  did not impact the pt's swallowing safety,  therefore MD was not consulted at this time.  Given pt's  complicated and prolonged GI history, pt remains at an increased  risk of aspiration; however, pt is effectively protecting her  airway and no aspiration was visualized on this study.   Pt would  benefit from GI follow up as SLP suspects this is likely the  primary contributing factor to pt's recurrent aspiration  pneumonia versus pharyngeal strength and/or coordination.    Swallow Evaluation Recommendations:  Recommended Consults: Consider GI evaluation Diet Recommendations: Regular;Thin liquid Liquid Administration via: Cup;Straw Supervision: Full supervision/cueing for compensatory strategies  (RN reported on initial eval that family requests full  supervision during meals ) Compensations: Slow rate;Follow solids with liquid;Small  sips/bites  Postural Changes and/or Swallow Maneuvers: Out of bed for  meals;Seated upright 90 degrees;Upright 30-60 min after meal Oral Care Recommendations: Oral care BID Follow up Recommendations: None    Prognosis:  Prognosis for Safe Diet Advancement: Good Barriers to Reach Goals: Time post onset   Individuals Consulted: Consulted and Agree with Results and  Recommendations: Patient Report Sent to : Referring physician      SLP Assessment/Plan  Plan:   Please see treatment plan outlined in IPOC   Short Term Goals: Week 1: SLP Short Term Goal 1 (Week 1): Pt will  tolerate her currently prescribed diet with no overt s/s of  aspiration with modified independence.  SLP Short Term Goal 2 (Week 1): Pt will participate in an  objective swallow study to determine safest diet consistency.      General: Date of Onset: 01/06/14 Type of Study: Modified Barium Swallowing Study Reason for Referral: Objectively evaluate swallowing function Previous Swallow Assessment: pt reports history of "swallow  tests" in the setting of esophagectomy, no MBS noted per chart  review Diet Prior to this Study:  Regular;Thin liquids Temperature Spikes Noted: No Respiratory Status: Room air History of Recent Intubation: No Behavior/Cognition: Alert;Cooperative;Pleasant mood Oral Cavity - Dentition: Adequate natural dentition Oral Motor / Sensory Function: Within functional limits Self-Feeding Abilities: Able to feed self Patient Positioning: Upright in chair Baseline Vocal Quality: Clear Volitional Cough: Strong Anatomy: Other (Comment) (C2-C3 and C5-C7 boney prominences) Pharyngeal Secretions: Not observed secondary MBS   Reason for Referral:   Objectively evaluate swallowing function    Oral Phase: Oral Preparation/Oral Phase Oral Phase: WFL   Pharyngeal Phase:  Pharyngeal Phase Pharyngeal Phase: Within functional limits   Cervical Esophageal Phase  Cervical Esophageal Phase Cervical Esophageal Phase: Impaired Cervical Esophageal Phase - Thin Thin Cup: Prominent cricopharyngeal segment;Other (Comment)  (anterior protusion at the level of the cricopharyngeal segment) Thin Straw: Prominent cricopharyngeal segment;Other (Comment)  (anterior protusion at the level of the cricopharyngeal segment) Cervical Esophageal Phase - Solids Puree: Prominent cricopharyngeal segment;Other (Comment)  (anterior protusion at the level of the cricopharyngeal segment) Regular: Prominent cricopharyngeal segment;Other (Comment)  (anterior protusion at the level of the cricopharyngeal segment)   GN        Windell Moulding, M.A. CCC-SLP  Page, Elmyra Ricks L 01/13/2014, 10:30 AM                     Patient seen by me. Patient brought here for evaluation of a fever up to 101 that started at home this morning. Patient's also had a cough. Were highly suspicious that she's trying to develop a pneumonia. This would be a healthcare acquired pneumonia she was recently in the hospital. Started on those antibiotics. Patient nontoxic no acute distress. Chest x-ray here negative. Urinalysis negative. Patient also with significant leukocytosis 17,000. Patient will be  admitted by the hospitalist. Patient lungs on examination without any significant findings.  Fredia Sorrow, MD 01/22/14 [phone]

## 2014-01-22 NOTE — ED Notes (Signed)
Admitting MD at bedside.

## 2014-01-22 NOTE — Progress Notes (Signed)
Arrived to 5w31 from ED, denies

## 2014-01-22 NOTE — Telephone Encounter (Signed)
FYI

## 2014-01-22 NOTE — ED Provider Notes (Signed)
CSN: [id number]     Arrival date & time 01/22/14  1222 History   First MD Initiated Contact with Patient 01/22/14 1331     Chief Complaint  Patient presents with  . Fever   HPI  [name] is a 80 y.o. female with a PMH of asthma, GERD, osteoporosis, anemia, HTN, allergies, atrial fibrillation, esophageal cancer, basal cell carcinoma (face), pneumonia, headaches and arthritis who presents to the ED for evaluation of fever. History was provided by the patient. Patient has had a cough for the past 2-3 days. Cough is non-productive. Patient is not a tobacco user. She denies any chest pain or SOB. Uses an inhaler twice daily for her asthma. Has had mild intermittent wheezing. She also complains of a fever which started today of 101.7 at home. She also has had fatigue and generalized weakness. She denies any dysuria, abdominal pain, nausea, emesis, rhinorrhea, congestion, sore throat, dizziness, lightheadedness or headache. Patient lives at home alone. She was recently admitted on 01/09/14 and discharged on 01/17/14 after a fall. She was treated for pneumonia during this visit. Per family, patient has hx of recurrent pneumonia and "when she stops antibiotics she gets it again."  Has abrasion on left forearm from previous fall which has been changed by home health. No drainage or wound concerns.    Past Medical History  Diagnosis Date  . GERD (gastroesophageal reflux disease)   . Osteoporosis   . Postmenopausal HRT (hormone replacement therapy)   . Anemia   . Hypertension   . Allergy   . Asthma   . Shortness of breath   . PONV (postoperative nausea and vomiting)   . Dysrhythmia     a fib  . Esophageal cancer     removed esophagus stomach pulled up  . Basal cell carcinoma of face     "several; freezes them off"  . Pneumonia     "all the time; probably 5 times in the last year, counting today" (01/06/2014)  . Chronic bronchitis     "practically q winter" (01/06/2014)  . History of blood  transfusion 1993    "related to esophagus removal" (01/06/2014)  . Headache(784.0)     "monthly in the past year" (01/06/2014)  . Arthritis     "legs" (01/06/2014)   Past Surgical History  Procedure Laterality Date  . Left oophorectomy  1952  . Esophageal removal of cancer,gastric ppull-thru 1993    . Forearm fracture surgery Right ?1990's  . Esophagogastroduodenoscopy  10/19/2011    Procedure: ESOPHAGOGASTRODUODENOSCOPY (EGD);  Surgeon: Milus Banister, MD;  Location: [facility];  Service: Endoscopy;  Laterality: N/A;  . Balloon dilation  10/19/2011    Procedure: BALLOON DILATION;  Surgeon: Milus Banister, MD;  Location: [facility];  Service: Endoscopy;  Laterality: N/A;  . Esophagogastroduodenoscopy  11/07/2011    Procedure: ESOPHAGOGASTRODUODENOSCOPY (EGD);  Surgeon: Ladene Artist, MD,FACG;  Location: [facility] ENDOSCOPY;  Service: Endoscopy;  Laterality: N/A;  . Esophagogastroduodenoscopy  11/22/2011    Procedure: ESOPHAGOGASTRODUODENOSCOPY (EGD);  Surgeon: Inda Castle, MD;  Location: [facility];  Service: Endoscopy;  Laterality: N/A;  . Esophagogastroduodenoscopy  11/24/2011    Procedure: ESOPHAGOGASTRODUODENOSCOPY (EGD);  Surgeon: Inda Castle, MD;  Location: [facility];  Service: Endoscopy;  Laterality: N/A;  with removable duodenal stent (actually using esophageal partially covered 23X15 located in locked supply room  . Duodenal stent placement  11/24/2011    Procedure: DUODENAL STENT PLACEMENT;  Surgeon: Inda Castle, MD;  Location: [facility];  Service: Endoscopy;  Laterality: N/A;  . Video bronchoscopy Bilateral 01/18/2013    Procedure: VIDEO BRONCHOSCOPY WITHOUT FLUORO;  Surgeon: Tanda Rockers, MD;  Location: [facility]OS[facility];  Service: Cardiopulmonary;  Laterality: Bilateral;  . Esophagogastroduodenoscopy N/A 02/11/2013    Procedure: ESOPHAGOGASTRODUODENOSCOPY (EGD);  Surgeon: Inda Castle, MD;  Location: [facility];  Service: Endoscopy;  Laterality: N/A;  . Duodenal stent  placement N/A 02/11/2013    Procedure: DUODENAL STENT PLACEMENT;  Surgeon: Inda Castle, MD;  Location: [facility];  Service: Endoscopy;  Laterality: N/A;  . Esophagogastroduodenoscopy N/A 09/11/2013    Procedure: ESOPHAGOGASTRODUODENOSCOPY (EGD);  Surgeon: Gatha Mayer, MD;  Location: [facility];  Service: Endoscopy;  Laterality: N/A;  . Cholecystectomy  ~ 1996  . Dilation and curettage of uterus  X 6-7  . Cataract extraction w/ intraocular lens  implant, bilateral Bilateral ~ 2000   Family History  Problem Relation Age of Onset  . Cervical cancer Mother   . Heart disease Father     MI  . Coronary artery disease Brother   . Hypotension Sister   . Colon cancer Neg Hx   . Colon polyps Sister   . Pancreatic cancer      1/2 sister  . Coronary artery disease Sister     pacemaker  . Asthma Brother   . Asthma Sister    History  Substance Use Topics  . Smoking status: Never Smoker   . Smokeless tobacco: Never Used  . Alcohol Use: No   OB History   Grav Para Term Preterm Abortions TAB SAB Ect Mult Living                 Review of Systems  Constitutional: Positive for fatigue. Negative for fever, activity change and appetite change.  HENT: Negative for congestion and rhinorrhea.   Eyes: Negative for photophobia and visual disturbance.  Respiratory: Positive for cough and wheezing. Negative for chest tightness and shortness of breath.   Cardiovascular: Negative for chest pain and leg swelling.  Gastrointestinal: Negative for nausea, vomiting, abdominal pain, diarrhea and constipation.  Genitourinary: Negative for dysuria, decreased urine volume and difficulty urinating.  Musculoskeletal: Negative for myalgias.  Skin: Positive for wound.  Neurological: Positive for weakness (generalized). Negative for dizziness, light-headedness and headaches.    Allergies  Erythromycin ethylsuccinate; Prednisone; and Doxycycline  Home Medications   Prior to Admission medications    Medication Sig Start Date End Date Taking? Authorizing Provider  acetaminophen (TYLENOL) 500 MG tablet Take 1 tablet (500 mg total) by mouth every 6 (six) hours as needed for headache. 12/02/13   Tammy S Parrett, NP  albuterol (PROVENTIL HFA;VENTOLIN HFA) 108 (90 BASE) MCG/ACT inhaler Inhale 2 puffs into the lungs every 6 (six) hours as needed. For wheezing 12/02/13   Tammy S Parrett, NP  albuterol (PROVENTIL) (2.5 MG/3ML) 0.083% nebulizer solution Take 3 mLs (2.5 mg total) by nebulization every 4 (four) hours as needed for wheezing or shortness of breath. 12/10/13   Tanda Rockers, MD  amiodarone (PACERONE) 200 MG tablet One pill twice a day till 01/22/14. Then decrease to one pill daily 01/17/14   Ivan Anchors Love, PA-C  arformoterol (BROVANA) 15 MCG/2ML NEBU Take 2 mLs (15 mcg total) by nebulization 2 (two) times daily. Dx: 493.00 12/02/13   Tammy S Parrett, NP  budesonide (PULMICORT) 0.25 MG/2ML nebulizer solution Take 2 mLs (0.25 mg total) by nebulization 2 (two) times daily. Dx: 493.00 12/02/13   Melvenia Needles, NP  cefUROXime (CEFTIN)  500 MG tablet Take 1 tablet (500 mg total) by mouth 2 (two) times daily with a meal. 01/22/14   Ricard Dillon, MD  chlorpheniramine-HYDROcodone [facility] PENNKINETIC ER) 10-8 MG/5ML LQCR Take 5 mLs by mouth every 12 (twelve) hours as needed for cough. 12/10/13   Tanda Rockers, MD  dextromethorphan-guaiFENesin [facility] DM) 30-600 MG per 12 hr tablet Take 1 tablet by mouth 2 (two) times daily as needed for cough. 01/09/14   Delfina Redwood, MD  digoxin (LANOXIN) 0.125 MG tablet Take 1 tablet (0.125 mg total) by mouth daily. 12/21/12 01/06/14  Ricard Dillon, MD  Fe Fum-FA-B Cmp-C-Zn-Mg-Mn-Cu (HEMOCYTE PLUS) 106-1 MG CAPS Take 1 tablet by mouth 2 (two) times daily. 12/02/13   Tammy S Parrett, NP  feeding supplement, ENSURE COMPLETE, (ENSURE COMPLETE) LIQD Take 237 mLs by mouth daily with lunch. 01/09/14   Delfina Redwood, MD  furosemide (LASIX) 40 MG tablet Take 1 tablet (40 mg  total) by mouth daily. 10/11/13   Timoteo Gaul, FNP  Methylcobalamin (B-12) 5000 MCG TBDP Take 1 tablet by mouth daily.    Historical Provider, MD  metoCLOPramide (REGLAN) 5 MG tablet Take 1 tablet (5 mg total) by mouth 2 (two) times daily before a meal. 01/09/14   Delfina Redwood, MD  Multiple Vitamins-Calcium (VIACTIV MULTI-VITAMIN) CHEW Chew 1 each by mouth daily. 12/02/13   Tammy S Parrett, NP  Olopatadine HCl (PATADAY) 0.2 % SOLN Apply 1-2 drops to eye 2 (two) times daily as needed (dry, itchy eyes.).    Historical Provider, MD  oxybutynin (DITROPAN) 5 MG tablet Take 0.5 tablets (2.5 mg total) by mouth 2 (two) times daily. For hyperactive bladder 01/17/14   Ivan Anchors Love, PA-C  pantoprazole (PROTONIX) 40 MG tablet Take 1 tablet (40 mg total) by mouth 2 (two) times daily before a meal. 01/09/14   Delfina Redwood, MD  promethazine (PHENERGAN) 25 MG suppository Place 25 mg rectally every 6 (six) hours as needed for nausea or vomiting.    Historical Provider, MD  sucralfate (CARAFATE) 1 GM/10ML suspension Take 10 mLs (1 g total) by mouth 2 (two) times daily before a meal. 01/09/14   Delfina Redwood, MD  traMADol (ULTRAM) 50 MG tablet Take 1 tablet (50 mg total) by mouth every 8 (eight) hours as needed for moderate pain. 09/17/13   Allie Bossier, MD  Vitamin D, Ergocalciferol, (DRISDOL) 50000 UNITS CAPS capsule Take 1 capsule (50,000 Units total) by mouth every 7 (seven) days. On Friday 12/10/13   Ricard Dillon, MD  zolpidem (AMBIEN) 5 MG tablet Take 5 mg by mouth at bedtime.    Historical Provider, MD   BP 108/58  Pulse 95  Temp(Src) 99.5 F (37.5 C) (Oral)  Resp 18  SpO2 94%  Filed Vitals:   01/22/14 1430 01/22/14 1437 01/22/14 1500 01/22/14 1555  BP: 113/69  93/45 97/62  Pulse: 87  87 94  Temp:  100.3 F (37.9 C)    TempSrc:  Rectal    Resp: 20  19 18   SpO2: 94%  93% 93%    Physical Exam  Nursing note and vitals reviewed. Constitutional: She is oriented to person, place,  and time. She appears well-developed and well-nourished. No distress.  Non-toxic  HENT:  Head: Normocephalic and atraumatic.  Right Ear: External ear normal.  Left Ear: External ear normal.  Nose: Nose normal.  Mouth/Throat: Oropharynx is clear and moist.  Eyes: Conjunctivae are normal. Right eye exhibits no discharge. Left  eye exhibits no discharge.  Neck: Normal range of motion. Neck supple.  Cardiovascular: Normal rate, regular rhythm and normal heart sounds.  Exam reveals no gallop and no friction rub.   No murmur heard. Pulmonary/Chest: Effort normal. No respiratory distress. She has wheezes. She has no rales. She exhibits no tenderness.  Intermittent cough. Coarse breath sounds throughout.  Abdominal: Soft. She exhibits no distension. There is no tenderness.  Musculoskeletal: Normal range of motion. She exhibits no edema and no tenderness.  No LE edema or calf tenderness bilaterally. Patient moving all extremities  Neurological: She is alert and oriented to person, place, and time.  Skin: Skin is warm and dry. She is not diaphoretic.  Abrasion to left forearm with no surrounding edema, erythema or drainage.     ED Course  Procedures (including critical care time) Labs Review Labs Reviewed  CBC WITH DIFFERENTIAL - Abnormal; Notable for the following:    WBC 17.4 (*)    Hemoglobin 11.2 (*)    HCT 35.7 (*)    MCV 77.6 (*)    MCH 24.3 (*)    RDW 16.7 (*)    Platelets 405 (*)    Neutrophils Relative % 81 (*)    Neutro Abs 14.3 (*)    Lymphocytes Relative 9 (*)    Monocytes Absolute 1.5 (*)    All other components within normal limits  COMPREHENSIVE METABOLIC PANEL    Imaging Review Dg Chest 2 View  01/22/2014   CLINICAL DATA:  Fever  EXAM: CHEST  2 VIEW  COMPARISON:  01/17/2014  FINDINGS: Esophagectomy and gastric pull through. Gastric stent unchanged in position.  Mild left lower lobe atelectasis/ infiltrate is unchanged from the prior study. Mild right lower lobe  atelectasis unchanged. No definite pneumonia or effusion. Negative for heart failure.  IMPRESSION: Bibasilar atelectasis unchanged.  No definite pneumonia.   Electronically Signed   By: Franchot Gallo M.D.   On: 01/22/2014 13:49     EKG Interpretation   Date/Time:  Wednesday January 22 2014 14:43:43 EDT Ventricular Rate:  88 PR Interval:  169 QRS Duration: 76 QT Interval:  357 QTC Calculation: 432 R Axis:   -14 Text Interpretation:  Sinus rhythm Probable left atrial enlargement  Consider anterior infarct Minimal ST depression, lateral leads NO  SIGNIFICANT CHANGE SINCE LAST TRACING YESTERDAY Confirmed by Rogene Houston   MD, SCOTT [phone]) on 01/22/2014 2:47:24 PM      Results for orders placed during the hospital encounter of 01/22/14  CBC WITH DIFFERENTIAL      Result Value Ref Range   WBC 17.4 (*) 4.0 - 10.5 K/uL   RBC 4.60  3.87 - 5.11 MIL/uL   Hemoglobin 11.2 (*) 12.0 - 15.0 g/dL   HCT 35.7 (*) 36.0 - 46.0 %   MCV 77.6 (*) 78.0 - 100.0 fL   MCH 24.3 (*) 26.0 - 34.0 pg   MCHC 31.4  30.0 - 36.0 g/dL   RDW 16.7 (*) 11.5 - 15.5 %   Platelets 405 (*) 150 - 400 K/uL   Neutrophils Relative % 81 (*) 43 - 77 %   Neutro Abs 14.3 (*) 1.7 - 7.7 K/uL   Lymphocytes Relative 9 (*) 12 - 46 %   Lymphs Abs 1.6  0.7 - 4.0 K/uL   Monocytes Relative 9  3 - 12 %   Monocytes Absolute 1.5 (*) 0.1 - 1.0 K/uL   Eosinophils Relative 1  0 - 5 %   Eosinophils Absolute 0.1  0.0 - 0.7  K/uL   Basophils Relative 0  0 - 1 %   Basophils Absolute 0.0  0.0 - 0.1 K/uL  COMPREHENSIVE METABOLIC PANEL      Result Value Ref Range   Sodium 133 (*) 137 - 147 mEq/L   Potassium 3.9  3.7 - 5.3 mEq/L   Chloride 91 (*) 96 - 112 mEq/L   CO2 28  19 - 32 mEq/L   Glucose, Bld 115 (*) 70 - 99 mg/dL   BUN 9  6 - 23 mg/dL   Creatinine, Ser 0.75  0.50 - 1.10 mg/dL   Calcium 9.4  8.4 - 10.5 mg/dL   Total Protein 6.0  6.0 - 8.3 g/dL   Albumin 3.1 (*) 3.5 - 5.2 g/dL   AST 21  0 - 37 U/L   ALT 13  0 - 35 U/L   Alkaline  Phosphatase 85  39 - 117 U/L   Total Bilirubin 0.5  0.3 - 1.2 mg/dL   GFR calc non Af Amer 77 (*) >90 mL/min   GFR calc Af Amer 89 (*) >90 mL/min  URINALYSIS, ROUTINE W REFLEX MICROSCOPIC      Result Value Ref Range   Color, Urine YELLOW  YELLOW   APPearance CLEAR  CLEAR   Specific Gravity, Urine 1.011  1.005 - 1.030   pH 8.0  5.0 - 8.0   Glucose, UA NEGATIVE  NEGATIVE mg/dL   Hgb urine dipstick NEGATIVE  NEGATIVE   Bilirubin Urine NEGATIVE  NEGATIVE   Ketones, ur NEGATIVE  NEGATIVE mg/dL   Protein, ur NEGATIVE  NEGATIVE mg/dL   Urobilinogen, UA 0.2  0.0 - 1.0 mg/dL   Nitrite NEGATIVE  NEGATIVE   Leukocytes, UA TRACE (*) NEGATIVE  TROPONIN I      Result Value Ref Range   Troponin I <0.30  <0.30 ng/mL  URINE MICROSCOPIC-ADD ON      Result Value Ref Range   Squamous Epithelial / LPF RARE  RARE   WBC, UA 0-2  <3 WBC/hpf   RBC / HPF 0-2  <3 RBC/hpf   Bacteria, UA RARE  RARE  I-STAT CG4 LACTIC ACID, ED      Result Value Ref Range   Lactic Acid, Venous 0.98  0.5 - 2.2 mmol/L     MDM   [name] is a 80 y.o. female with a PMH of asthma, GERD, osteoporosis, anemia, HTN, allergies, atrial fibrillation, esophageal cancer, basal cell carcinoma (face), pneumonia, headaches and arthritis who presents to the ED for evaluation of fever. Fever likely due to developing pneumonia. Chest x-ray shows no definite pneumonia, however, patient has hx of recurrent pneumonia, which is likely aspiration given hx of esophageal cancer. Will treat for HAP due to recent admission. Patient has low grade fever (100.3) and leukocytosis (17.4). Lactic acid WNL. Patient stable. Patient and family in agreement with admission.    Final impressions: 1. HAP (hospital-acquired pneumonia)   2. Chronic atrial fibrillation   3. Aspiration pneumonia, unspecified aspiration pneumonia type   4. Chronic diastolic CHF (congestive heart failure)   5. Unspecified essential hypertension       Mercy Moore PA-C   This patient was discussed with Dr. Huel Cote, PA-C 01/22/14 2253

## 2014-01-22 NOTE — ED Notes (Signed)
Kaitlin PA at bedside

## 2014-01-22 NOTE — H&P (Signed)
[facility] History and Physical  [name] XAJ:[id number] DOB: 07-11-1929 DOA: 01/22/2014  Referring physician: Dr. Caleb Popp PCP: Donia Ast, FNP   Chief Complaint: cough  HPI: [name] is a 80 y.o. female  With past medical history of esophageal cancer status post esophagectomy, recently discharged from the hospital for aspiration pneumonia that comes in again for cough and fever started 2 days prior to admission. She relates she's had this cough that is productive yellow color no blood. She denies any chest pain or shortness of breath, she relates she is using proper eating hygiene, has been having a fever of 101.7. Also feels a week. Denies any abdominal pain nausea emesis rhinorrhea congestion sore throat lightheadedness or headache. She lives at home by herself.  In the ED: Temperature was checked which was 100.4, a CBC was done that shows a white count of 17 with a left shift she was mildly hyponatremic and hypochloremic, chest x-ray did not show any infiltrate so we are consulted for further evaluation.   Review of Systems:  Constitutional:  No weight loss, night sweats, Fevers, chills, fatigue.  HEENT:  No headaches, Difficulty swallowing,Tooth/dental problems,Sore throat,  No sneezing, itching, ear ache, nasal congestion, post nasal drip,  Cardio-vascular:  No chest pain, Orthopnea, PND, swelling in lower extremities, anasarca, dizziness, palpitations  GI:  No heartburn, indigestion, abdominal pain, nausea, vomiting, diarrhea, change in bowel habits, loss of appetite  Resp:  No shortness of breath with exertion or at rest. No excess mucus, no productive cough, No non-productive cough, No coughing up of blood.No change in color of mucus.No wheezing.No chest wall deformity  Skin:  no rash or lesions.  GU:  no dysuria, change in color of urine, no urgency or frequency. No flank pain.  Musculoskeletal:  No joint pain or swelling. No  decreased range of motion. No back pain.  Psych:  No change in mood or affect. No depression or anxiety. No memory loss.   Past Medical History  Diagnosis Date  . GERD (gastroesophageal reflux disease)   . Osteoporosis   . Postmenopausal HRT (hormone replacement therapy)   . Anemia   . Hypertension   . Allergy   . Asthma   . Shortness of breath   . PONV (postoperative nausea and vomiting)   . Dysrhythmia     a fib  . Esophageal cancer     removed esophagus stomach pulled up  . Basal cell carcinoma of face     "several; freezes them off"  . Pneumonia     "all the time; probably 5 times in the last year, counting today" (01/06/2014)  . Chronic bronchitis     "practically q winter" (01/06/2014)  . History of blood transfusion 1993    "related to esophagus removal" (01/06/2014)  . Headache(784.0)     "monthly in the past year" (01/06/2014)  . Arthritis     "legs" (01/06/2014)   Past Surgical History  Procedure Laterality Date  . Left oophorectomy  1952  . Esophageal removal of cancer,gastric ppull-thru 1993    . Forearm fracture surgery Right ?1990's  . Esophagogastroduodenoscopy  10/19/2011    Procedure: ESOPHAGOGASTRODUODENOSCOPY (EGD);  Surgeon: Milus Banister, MD;  Location: [facility];  Service: Endoscopy;  Laterality: N/A;  . Balloon dilation  10/19/2011    Procedure: BALLOON DILATION;  Surgeon: Milus Banister, MD;  Location: [facility] ENDOSCOPY;  Service: Endoscopy;  Laterality: N/A;  . Esophagogastroduodenoscopy  11/07/2011    Procedure: ESOPHAGOGASTRODUODENOSCOPY (EGD);  Surgeon: Ladene Artist, MD,FACG;  Location: [facility] ENDOSCOPY;  Service: Endoscopy;  Laterality: N/A;  . Esophagogastroduodenoscopy  11/22/2011    Procedure: ESOPHAGOGASTRODUODENOSCOPY (EGD);  Surgeon: Inda Castle, MD;  Location: [facility];  Service: Endoscopy;  Laterality: N/A;  . Esophagogastroduodenoscopy  11/24/2011    Procedure: ESOPHAGOGASTRODUODENOSCOPY (EGD);  Surgeon: Inda Castle, MD;  Location: [facility]  ENDOSCOPY;  Service: Endoscopy;  Laterality: N/A;  with removable duodenal stent (actually using esophageal partially covered 23X15 located in locked supply room  . Duodenal stent placement  11/24/2011    Procedure: DUODENAL STENT PLACEMENT;  Surgeon: Inda Castle, MD;  Location: [facility] ENDOSCOPY;  Service: Endoscopy;  Laterality: N/A;  . Video bronchoscopy Bilateral 01/18/2013    Procedure: VIDEO BRONCHOSCOPY WITHOUT FLUORO;  Surgeon: Tanda Rockers, MD;  Location: [facility] ENDOSCOPY;  Service: Cardiopulmonary;  Laterality: Bilateral;  . Esophagogastroduodenoscopy N/A 02/11/2013    Procedure: ESOPHAGOGASTRODUODENOSCOPY (EGD);  Surgeon: Inda Castle, MD;  Location: [facility];  Service: Endoscopy;  Laterality: N/A;  . Duodenal stent placement N/A 02/11/2013    Procedure: DUODENAL STENT PLACEMENT;  Surgeon: Inda Castle, MD;  Location: [facility];  Service: Endoscopy;  Laterality: N/A;  . Esophagogastroduodenoscopy N/A 09/11/2013    Procedure: ESOPHAGOGASTRODUODENOSCOPY (EGD);  Surgeon: Gatha Mayer, MD;  Location: [facility];  Service: Endoscopy;  Laterality: N/A;  . Cholecystectomy  ~ 1996  . Dilation and curettage of uterus  X 6-7  . Cataract extraction w/ intraocular lens  implant, bilateral Bilateral ~ 2000   Social History:  reports that she has never smoked. She has never used smokeless tobacco. She reports that she does not drink alcohol or use illicit drugs.  Allergies  Allergen Reactions  . Erythromycin Ethylsuccinate     Irregular pulse rate  . Prednisone     "makes me hyper"  . Doxycycline Rash    Family History  Problem Relation Age of Onset  . Cervical cancer Mother   . Heart disease Father     MI  . Coronary artery disease Brother   . Hypotension Sister   . Colon cancer Neg Hx   . Colon polyps Sister   . Pancreatic cancer      1/2 sister  . Coronary artery disease Sister     pacemaker  . Asthma Brother   . Asthma Sister      Prior to Admission medications     Medication Sig Start Date End Date Taking? Authorizing Provider  acetaminophen (TYLENOL) 500 MG tablet Take 1 tablet (500 mg total) by mouth every 6 (six) hours as needed for headache. 12/02/13  Yes Tammy S Parrett, NP  albuterol (PROVENTIL HFA;VENTOLIN HFA) 108 (90 BASE) MCG/ACT inhaler Inhale 2 puffs into the lungs every 6 (six) hours as needed. For wheezing 12/02/13  Yes Tammy S Parrett, NP  albuterol (PROVENTIL) (2.5 MG/3ML) 0.083% nebulizer solution Take 3 mLs (2.5 mg total) by nebulization every 4 (four) hours as needed for wheezing or shortness of breath. 12/10/13  Yes Tanda Rockers, MD  amiodarone (PACERONE) 200 MG tablet Take 200 mg by mouth daily.   Yes Historical Provider, MD  arformoterol (BROVANA) 15 MCG/2ML NEBU Take 2 mLs (15 mcg total) by nebulization 2 (two) times daily. Dx: 493.00 12/02/13  Yes Tammy S Parrett, NP  budesonide (PULMICORT) 0.25 MG/2ML nebulizer solution Take 2 mLs (0.25 mg total) by nebulization 2 (two) times daily. Dx: 493.00 12/02/13  Yes Tammy Jeralene Huff, NP  chlorpheniramine-HYDROcodone (TUSSIONEX PENNKINETIC ER) 10-8 MG/5ML LQCR Take  5 mLs by mouth every 12 (twelve) hours as needed for cough. 12/10/13  Yes Tanda Rockers, MD  ergocalciferol (VITAMIN D2) 50000 UNITS capsule Take 50,000 Units by mouth every Friday.   Yes Historical Provider, MD  Fe Fum-FA-B Cmp-C-Zn-Mg-Mn-Cu (HEMOCYTE PLUS) 106-1 MG CAPS Take 1 tablet by mouth 2 (two) times daily. 12/02/13  Yes Tammy S Parrett, NP  furosemide (LASIX) 40 MG tablet Take 1 tablet (40 mg total) by mouth daily. 10/11/13  Yes Timoteo Gaul, FNP  GuaiFENesin (MUCINEX CHILDRENS PO) Take 0.5 tablets by mouth 2 (two) times daily.   Yes Historical Provider, MD  Lansoprazole (PREVACID PO) Take 1 tablet by mouth 2 (two) times daily before a meal. 2 tablets 1 hour before breakfast and 2 tablets 1 hour before last meal of the day. Prevacid Solutab   Yes Historical Provider, MD  Methylcobalamin (B-12) 5000 MCG TBDP Take 1 tablet by mouth  daily.   Yes Historical Provider, MD  Multiple Vitamins-Calcium (VIACTIV MULTI-VITAMIN) CHEW Chew 1 each by mouth daily. 12/02/13  Yes Tammy S Parrett, NP  Olopatadine HCl (PATADAY) 0.2 % SOLN Apply 1-2 drops to eye 2 (two) times daily as needed (dry, itchy eyes.).   Yes Historical Provider, MD  oxybutynin (DITROPAN) 5 MG tablet Take 0.5 tablets (2.5 mg total) by mouth 2 (two) times daily. For hyperactive bladder 01/17/14  Yes Ivan Anchors Love, PA-C  promethazine (PHENERGAN) 25 MG suppository Place 25 mg rectally every 6 (six) hours as needed for nausea or vomiting.   Yes Historical Provider, MD  sucralfate (CARAFATE) 1 GM/10ML suspension Take 10 mLs (1 g total) by mouth 2 (two) times daily before a meal. 01/09/14  Yes Delfina Redwood, MD  traMADol (ULTRAM) 50 MG tablet Take 1 tablet (50 mg total) by mouth every 8 (eight) hours as needed for moderate pain. 09/17/13  Yes Allie Bossier, MD  zolpidem (AMBIEN) 5 MG tablet Take 5 mg by mouth at bedtime.   Yes Historical Provider, MD   Physical Exam: Filed Vitals:   01/22/14 1630  BP: 102/60  Pulse: 84  Temp:   Resp: 19    BP 102/60  Pulse 84  Temp(Src) 100.3 F (37.9 C) (Rectal)  Resp 19  Ht 4' 11.84" (1.52 m)  Wt 56.1 kg (123 lb 10.9 oz)  BMI 24.28 kg/m2  SpO2 97%  General:  Appears calm and comfortable, in no acute distress. Eyes: PERRL, normal lids, irises & conjunctiva ENT: grossly normal hearing, lips & tongue Neck: no LAD, masses or thyromegaly, she does have a good lordosis in her cervical spine. Cardiovascular: RRR, no m/r/g. No LE edema. Telemetry: SR, no arrhythmias  Respiratory: Good air movement with crackles on the right but are not clear with cough. Abdomen: soft, ntnd Skin: no rash or induration seen on limited exam Musculoskeletal: grossly normal tone BUE/BLE Neurologic: grossly non-focal.          Labs on Admission:  Basic Metabolic Panel:  Recent Labs Lab 01/16/14 0430 01/22/14 1251  NA 139 133*  K 4.4 3.9    CL 97 91*  CO2 33* 28  GLUCOSE 92 115*  BUN 12 9  CREATININE 0.82 0.75  CALCIUM 9.9 9.4   Liver Function Tests:  Recent Labs Lab 01/22/14 1251  AST 21  ALT 13  ALKPHOS 85  BILITOT 0.5  PROT 6.0  ALBUMIN 3.1*   No results found for this basename: LIPASE, AMYLASE,  in the last 168 hours No results found for this  basename: AMMONIA,  in the last 168 hours CBC:  Recent Labs Lab 01/16/14 0430 01/17/14 0533 01/22/14 1251  WBC 11.3* 7.3 17.4*  NEUTROABS  --   --  14.3*  HGB 11.1* 10.3* 11.2*  HCT 35.4* 33.1* 35.7*  MCV 78.7 78.4 77.6*  PLT 355 318 405*   Cardiac Enzymes:  Recent Labs Lab 01/22/14 1433  TROPONINI <0.30    BNP (last 3 results)  Recent Labs  02/25/13 1108  PROBNP 185.0*   CBG:  Recent Labs Lab 01/15/14 2143 01/16/14 0747 01/16/14 1642 01/18/14 0717 01/18/14 1116  GLUCAP 104* 98 94 99 111*    Radiological Exams on Admission: Dg Chest 2 View  01/22/2014   CLINICAL DATA:  Fever  EXAM: CHEST  2 VIEW  COMPARISON:  01/17/2014  FINDINGS: Esophagectomy and gastric pull through. Gastric stent unchanged in position.  Mild left lower lobe atelectasis/ infiltrate is unchanged from the prior study. Mild right lower lobe atelectasis unchanged. No definite pneumonia or effusion. Negative for heart failure.  IMPRESSION: Bibasilar atelectasis unchanged.  No definite pneumonia.   Electronically Signed   By: Franchot Gallo M.D.   On: 01/22/2014 13:49    EKG: Independently reviewed. Sinus rhythm with left atrial enlargement some nonspecific ST segment changes.  Assessment/Plan Aspiration pneumonia/Leukocytosis: - Start Unasyn IV, check blood cultures x2. She is status esophagectomy due to ESOPHAGEAL CANCER, which put's her at risk aspiration - This is her third episode of aspiration pneumonia this year. She's had an MBS in the past that was unremarkable. Speech is also evaluated her and found a low likelihood of aspiration. Question if she would benefit  from these studies. - Will consult SLP if negative will consider GI consult. - Continue regular diet.  HYPERTENSION - Continue home meds.  Chronic diastolic CHF (congestive heart failure) - Euvolemic. - cont home meds.  Chronic A. fib: - Now in sinus rhythm. Anticoagulation is prohibitive per notes with cardiology in 01/06/2014.  Code Status: presumed full Family Communication: daughter and son in law Disposition Plan: inpatient  Time spent: 46 minutes  Charlynne Cousins [facility] Pager [phone]  **Disclaimer: This note may have been dictated with voice recognition software. Similar sounding words can inadvertently be transcribed and this note may contain transcription errors which may not have been corrected upon publication of note.**

## 2014-01-22 NOTE — Telephone Encounter (Signed)
[facility] calling w/(+) cx.  Pt dcd from rehab unit.  Randell Loop given PCP Dr Benay Pillow name and contact info.

## 2014-01-22 NOTE — ED Notes (Signed)
Family asked for meal for pt. Spoke with PA about family concern.

## 2014-01-22 NOTE — Telephone Encounter (Signed)
Pt is coughing yellow sputum, fever 101.7, generally not feeling well. Cough started yesterday.  They wanted to be seen today but there are not any appointment slots available.

## 2014-01-22 NOTE — Telephone Encounter (Signed)
Left message to advise Rashada, per Abby Potash, pt should be seen at Urgent Care, ER or Dr. Melvyn Novas, her pulmonologist. Advised her to call back with further questions or concerns

## 2014-01-22 NOTE — Progress Notes (Signed)
ARRIVED TO 5W31 FROM ED,INSTRUCTED ON USAGE OF CALL LIGHT AND ROOM SURROUNDINGS,DENIES NAUSEA/PAIN AT THIS TIME

## 2014-01-22 NOTE — Telephone Encounter (Signed)
Spoke with rep at [facility] who advised that they have a new employee whose job is to call critical labs, however the lab was ordered and performed at [facility], not [facility].   Issue resolved

## 2014-01-22 NOTE — ED Notes (Signed)
Patient given water and ice for right hand.

## 2014-01-22 NOTE — ED Notes (Signed)
Kaitlin PA made aware of pt. headache

## 2014-01-22 NOTE — Telephone Encounter (Signed)
Pathogen report : vre detected/ vancomycin resistance enterococcos

## 2014-01-22 NOTE — ED Notes (Signed)
Pt presents to department for evaluation of fever. 101.7 at home this morning. States recent chest congestion and cough with yellow colored sputum. Also states recent fall x2 weeks ago, wound noted to R arm. Pt is alert and oriented x4.

## 2014-01-23 ENCOUNTER — Inpatient Hospital Stay (HOSPITAL_COMMUNITY): Payer: Medicare Other

## 2014-01-23 DIAGNOSIS — I359 Nonrheumatic aortic valve disorder, unspecified: Secondary | ICD-10-CM

## 2014-01-23 DIAGNOSIS — Z7901 Long term (current) use of anticoagulants: Secondary | ICD-10-CM

## 2014-01-23 DIAGNOSIS — D62 Acute posthemorrhagic anemia: Secondary | ICD-10-CM

## 2014-01-23 MED ORDER — PIPERACILLIN-TAZOBACTAM 3.375 G IVPB
3.3750 g | Freq: Three times a day (TID) | INTRAVENOUS | Status: DC
  Administered 2014-01-23: 3.375 g via INTRAVENOUS
  Filled 2014-01-23 (×3): qty 50

## 2014-01-23 MED ORDER — FUROSEMIDE 40 MG PO TABS: 40.0000 mg | ORAL_TABLET | Freq: Every day | ORAL | Status: DC

## 2014-01-23 MED ORDER — POLYETHYLENE GLYCOL 3350 17 G PO PACK
17.0000 g | PACK | Freq: Every day | ORAL | Status: DC
  Administered 2014-01-23 – 2014-01-27 (×3): 17 g via ORAL
  Filled 2014-01-23 (×6): qty 1

## 2014-01-23 MED ORDER — FUROSEMIDE 40 MG PO TABS
40.0000 mg | ORAL_TABLET | Freq: Every day | ORAL | Status: DC
  Administered 2014-01-24 – 2014-01-27 (×4): 40 mg via ORAL
  Filled 2014-01-23 (×4): qty 1

## 2014-01-23 MED ORDER — ONDANSETRON 8 MG PO TBDP
8.0000 mg | ORAL_TABLET | Freq: Three times a day (TID) | ORAL | Status: DC | PRN
  Administered 2014-01-23 – 2014-01-26 (×4): 8 mg via ORAL
  Administered 2014-01-26: 4 mg via ORAL
  Administered 2014-01-27: 8 mg via ORAL
  Filled 2014-01-23 (×5): qty 1

## 2014-01-23 MED ORDER — DEXTROSE 5 % IV SOLN
1.0000 g | Freq: Two times a day (BID) | INTRAVENOUS | Status: AC
  Administered 2014-01-23 – 2014-01-24 (×3): 1 g via INTRAVENOUS
  Filled 2014-01-23 (×3): qty 1

## 2014-01-23 MED ORDER — PANTOPRAZOLE SODIUM 40 MG PO TBEC
40.0000 mg | DELAYED_RELEASE_TABLET | Freq: Two times a day (BID) | ORAL | Status: DC
  Administered 2014-01-23: 40 mg via ORAL
  Filled 2014-01-23: qty 1

## 2014-01-23 NOTE — Progress Notes (Signed)
[facility]  Patient Status: Active (receiving services up to time of hospitalization)  [facility] is providing the following services: RN, PT, OT, MSW and HHA  If patient discharges after hours, please call [phone].   [name] 01/23/2014, 10:50 AM

## 2014-01-23 NOTE — Progress Notes (Addendum)
PROGRESS NOTE  RUTHEL MARTINE CBJ:[id number] DOB: September 18, 1928 DOA: 01/22/2014 PCP: Donia Ast, FNP  HPI/Subjective: [name], 80 year old with past medical history significant for asthma, GERD, anemia, Afib, HTN, pneumonia, esopageal cancer, esophagectomy presented to the ED with cough x3 days and fever x1 day. She was hospitalized 6/11-6/19 for a fall and while admitted was treated for aspiration pneumonia. In ED, is found to have third aspiration pneumonia this year.  States constant productive cough with orange/brown tinge, pleuritc chest pain, nausea without vomiting, and frontal headache. Denies diarrhea, sore throat, dyspnea, abdominal pain.  Assessment/Plan:  Principle Problem Aspiration Pneumonia Presented with cough and fever XRay shows bibasilar atelectassis and no definite pneumonia Possibly due to reflux causing aspiration while lying down.  Will increase PPI to bid, Raise HOB, institute reflux precautions. Treated with Vancomycin initially then switched to IV Zosyn Ordered SLP and Speech pathology - MBS unremarkable.   Advanced to Full liquid diet  May consider GI consultation/curbside to see if they have anything else to offer. Blood cultures pending - monitor leukocytosis  Active Problems  Hypertension Currently BP is soft. Continue furosemide with hold parameters  Hyponatremia/Hypochloremia Possibly due to low po intake Continue IVF Monitor Na and Cl levels  Weakness Chronic decline over 6 months PT Eval ordered and pending   Sepsis Possibly due to recurrent aspiration pneumonia  Monitor leukocytosis Ordered blood cultures - results pending   Chronic Diastolic CHF Stable Continue furosemide  Iron Deficiency Anemia Chronic problem. Takes iron at home.  Held inpatient. Continue to monitor Hematocrit and RBC  Chronic AFib Now in sinus rhythm  Anticoagulation is prohibitive per notes with cardiology 01/06/2014 Continue  amiodarone.  DVT Prophylaxis:  Heparin  Code Status: presumed full Family Communication: Patient is alert, orientated and understand the plan of care. Disposition Plan: Inpatient    Consultants:  none  Procedures:  none  Antibiotics: Antibiotics Given (last 72 hours)   Date/Time Action Medication Dose Rate   01/23/14 0906 Given   piperacillin-tazobactam (ZOSYN) IVPB 3.375 g 3.375 g 12.5 mL/hr   01/23/14 1439 Given   ceFEPIme (MAXIPIME) 1 g in dextrose 5 % 50 mL IVPB 1 g 100 mL/hr      Objective: Filed Vitals:   01/22/14 2037 01/23/14 0003 01/23/14 0508 01/23/14 1455  BP: 113/75  104/65 94/60  Pulse: 75 83 59 63  Temp: 98.9 F (37.2 C)  98 F (36.7 C) 98.4 F (36.9 C)  TempSrc: Oral  Oral Oral  Resp: 16 14 14 18   Height:      Weight:      SpO2: 95% 96% 95%    No intake or output data in the 24 hours ending 01/23/14 1529 Filed Weights   01/22/14 1604 01/22/14 1754  Weight: 56.1 kg (123 lb 10.9 oz) 56.2 kg (123 lb 14.4 oz)    Exam: General: Thin, Elderly, Well developed, NAD, +hand tremor. Very pleasant. HEENT:  Anicteic Sclera, MMM. No pharyngeal erythema or exudates  Neck: Supple, no masses  Cardiovascular: RRR, S1 S2 auscultated, no rubs, murmurs or gallops.   Respiratory: diffuse wheezing, chest equal chest rise Abdomen: Soft, nontender, nondistended, + bowel sounds  Extremities: warm dry without cyanosis or edema.   Skin: Abrasion on upper back and left arm from prior fall  Psych: Normal affect and demeanor with intact judgement and insight   Data Reviewed: Basic Metabolic Panel:  Recent Labs Lab 01/22/14 1251  NA 133*  K 3.9  CL 91*  CO2  28  GLUCOSE 115*  BUN 9  CREATININE 0.75  CALCIUM 9.4   Liver Function Tests:  Recent Labs Lab 01/22/14 1251  AST 21  ALT 13  ALKPHOS 85  BILITOT 0.5  PROT 6.0  ALBUMIN 3.1*   CBC:  Recent Labs Lab 01/17/14 0533 01/22/14 1251  WBC 7.3 17.4*  NEUTROABS  --  14.3*  HGB 10.3* 11.2*  HCT  33.1* 35.7*  MCV 78.4 77.6*  PLT 318 405*   Cardiac Enzymes:  Recent Labs Lab 01/22/14 1433  TROPONINI <0.30   BNP (last 3 results)  Recent Labs  02/25/13 1108  PROBNP 185.0*   CBG:  Recent Labs Lab 01/16/14 1642 01/18/14 0717 01/18/14 1116  GLUCAP 94 99 111*    Recent Results (from the past 240 hour(s))  URINE CULTURE     Status: None   Collection Time    01/14/14 10:41 PM      Result Value Ref Range Status   Specimen Description URINE, RANDOM   Final   Special Requests NONE   Final   Culture  Setup Time     Final   Value: 01/15/2014 08:41     Performed at [place]     Final   Value: 50,000 COLONIES/ML     Performed at [facility]   Culture     Final   Value: VANCOMYCIN RESISTANT ENTEROCOCCUS ISOLATED     Note: CRITICAL RESULT CALLED TO, READ BACK BY AND VERIFIED WITH: CATHY HAROLD @ DR [place] @ 8:29AM 01/22/14 BY COOKV     Performed at [facility]   Report Status 01/22/2014 FINAL   Final   Organism ID, Bacteria VANCOMYCIN RESISTANT ENTEROCOCCUS ISOLATED   Final     Studies: Dg Chest 2 View  01/22/2014   CLINICAL DATA:  Fever  EXAM: CHEST  2 VIEW  COMPARISON:  01/17/2014  FINDINGS: Esophagectomy and gastric pull through. Gastric stent unchanged in position.  Mild left lower lobe atelectasis/ infiltrate is unchanged from the prior study. Mild right lower lobe atelectasis unchanged. No definite pneumonia or effusion. Negative for heart failure.  IMPRESSION: Bibasilar atelectasis unchanged.  No definite pneumonia.   Electronically Signed   By: Franchot Gallo M.D.   On: 01/22/2014 13:49   Dg Swallowing Func-speech Pathology  01/23/2014   Orbie Pyo [place], CCC-SLP     01/23/2014  1:02 PM Objective Swallowing Evaluation: Modified Barium Swallowing Study   Patient Details  Name: CLEOPHA INDELICATO MRN: [id number] Date of Birth: June 03, 1929  Today's Date: 01/23/2014 Time: 0950-1010 SLP Time Calculation (min): 20 min   Past Medical History:  Past Medical History  Diagnosis Date  . GERD (gastroesophageal reflux disease)   . Osteoporosis   . Postmenopausal HRT (hormone replacement therapy)   . Anemia   . Allergy   . Asthma   . Shortness of breath   . PONV (postoperative nausea and vomiting)   . Dysrhythmia     a fib  . Chronic bronchitis     "practically q winter" (01/22/2014)  . History of blood transfusion 1993    "related to esophagus removal"  . Arthritis     "legs" (01/06/2014)  . Pneumonia     "all the time; probably 5 times in the last year, counting  today" (01/06/2014)  . Aspiration pneumonia 11/22/2013  . H/O hiatal hernia   . Headache(784.0)     "monthly in the past year" (01/22/2014)  . Chronic lower  back pain     "maybe cause I fell in the tub on 01/05/2014"  . Urinary frequency   . Esophageal cancer     removed esophagus stomach pulled up; "encapsulated; didn't have  to have radiation"  . Basal cell carcinoma of face     "several; freezes them off; face"   Past Surgical History:  Past Surgical History  Procedure Laterality Date  . Left oophorectomy  1952  . Esophageal removal of cancer,gastric ppull-thru 1993    . Forearm fracture surgery Right ?1990's  . Esophagogastroduodenoscopy  10/19/2011    Procedure: ESOPHAGOGASTRODUODENOSCOPY (EGD);  Surgeon: Milus Banister, MD;  Location: [facility];  Service: Endoscopy;   Laterality: N/A;  . Balloon dilation  10/19/2011    Procedure: BALLOON DILATION;  Surgeon: Milus Banister, MD;   Location: [facility] ENDOSCOPY;  Service: Endoscopy;  Laterality: N/A;  . Esophagogastroduodenoscopy  11/07/2011    Procedure: ESOPHAGOGASTRODUODENOSCOPY (EGD);  Surgeon: Ladene Artist, MD,FACG;  Location: [facility] ENDOSCOPY;  Service: Endoscopy;   Laterality: N/A;  . Esophagogastroduodenoscopy  11/22/2011    Procedure: ESOPHAGOGASTRODUODENOSCOPY (EGD);  Surgeon: Inda Castle, MD;  Location: [facility];  Service: Endoscopy;   Laterality: N/A;  . Esophagogastroduodenoscopy  11/24/2011    Procedure:  ESOPHAGOGASTRODUODENOSCOPY (EGD);  Surgeon: Inda Castle, MD;  Location: [facility];  Service: Endoscopy;   Laterality: N/A;  with removable duodenal stent (actually using  esophageal partially covered 23X15 located in locked supply room  . Duodenal stent placement  11/24/2011    Procedure: DUODENAL STENT PLACEMENT;  Surgeon: Inda Castle,  MD;  Location: [facility] ENDOSCOPY;  Service: Endoscopy;  Laterality:  N/A;  . Video bronchoscopy Bilateral 01/18/2013    Procedure: VIDEO BRONCHOSCOPY WITHOUT FLUORO;  Surgeon: Tanda Rockers, MD;  Location: [facility] ENDOSCOPY;  Service: Cardiopulmonary;   Laterality: Bilateral;  . Esophagogastroduodenoscopy N/A 02/11/2013    Procedure: ESOPHAGOGASTRODUODENOSCOPY (EGD);  Surgeon: Inda Castle, MD;  Location: [facility];  Service: Endoscopy;   Laterality: N/A;  . Duodenal stent placement N/A 02/11/2013    Procedure: DUODENAL STENT PLACEMENT;  Surgeon: Inda Castle,  MD;  Location: [facility] ENDOSCOPY;  Service: Endoscopy;  Laterality:  N/A;  . Esophagogastroduodenoscopy N/A 09/11/2013    Procedure: ESOPHAGOGASTRODUODENOSCOPY (EGD);  Surgeon: Gatha Mayer, MD;  Location: [facility];  Service: Endoscopy;   Laterality: N/A;  . Cholecystectomy  ~ 1996  . Dilation and curettage of uterus  X 6-7  . Cataract extraction w/ intraocular lens  implant, bilateral  Bilateral ~ 2000  . Appendectomy     HPI:  KHALA TARTE is an 80 y.o. female with history of A fib,  HTN, esophageal cancer s/p esophagectomy with gastric pull  through, severe GERD, gastroparesis, recurrent aspiration PNA for  past 6 months; who was found by her neighbor in her garden tub on  01/06/14. Family reported recent cold type symptoms for the past  few weeks with decrease in po intake as well as recent d/c of  coumadin due to hematemesis. CT head without acute changes.  She  was started on IV antibiotics for LLL aspiration PNA and  therapies ordered. Has had neck tilting towards the left side  since her surgery for  the esophageal cancer.       Assessment / Plan / Recommendation Clinical Impression  Dysphagia Diagnosis: Mild cervical esophageal phase dysphagia Clinical impression: Pt. exhibited functional oropharyngeal  function similar to findings during [facility] 01/13/14.  Laryngeal  elevation and anterior excursion resulted in adequate epiglottic  delfection and tracheal protection without penetration or  aspiration.  Cricopharyngeus muscle is prominent with residue  throughout the cervical esophagus that decreases with subsequent  sips of thin barium.  Aspiration risk is increased due to  superior location of stomach and likely source of repeated pna's.   SLP reviewed esophageal precautions although she appears very  compliant and aware of the importance of reflux strategies.   Recommend diet upgrade to regular texture (as she is able to  determine textures can tolerate) and thin liquids and strict  reflux precautions.  Will continue to follow x 1.    Treatment Recommendation  Therapy as outlined in treatment plan below    Diet Recommendation Regular;Thin liquid   Liquid Administration via: Cup;Straw Medication Administration: Whole meds with liquid Supervision: Patient able to self feed;Intermittent supervision  to cue for compensatory strategies Compensations: Slow rate;Follow solids with liquid;Small  sips/bites Postural Changes and/or Swallow Maneuvers: Out of bed for  meals;Seated upright 90 degrees;Upright 30-60 min after meal    Other  Recommendations Recommended Consults: Consider GI  evaluation Oral Care Recommendations: Oral care BID   Follow Up Recommendations  None    Frequency and Duration min 1 x/week  2 weeks   Pertinent Vitals/Pain WDL            Reason for Referral Objectively evaluate swallowing function   Oral Phase Oral Preparation/Oral Phase Oral Phase: WFL   Pharyngeal Phase Pharyngeal Phase Pharyngeal Phase: Within functional limits Pharyngeal - Thin Pharyngeal - Thin Cup: Within functional limits  Pharyngeal - Thin Straw: Within functional limits Pharyngeal - Solids Pharyngeal - Puree: Within functional limits Pharyngeal - Regular: Within functional limits  Cervical Esophageal Phase    GO    Cervical Esophageal Phase Cervical Esophageal Phase: Impaired Cervical Esophageal Phase - Thin Thin Cup: Prominent cricopharyngeal segment Thin Straw: Prominent cricopharyngeal segment Cervical Esophageal Phase - Solids Regular: Prominent cricopharyngeal segment         Orbie Pyo Litaker M.Ed CCC-SLP Pager [phone]  01/23/2014     Scheduled Meds: . amiodarone  200 mg Oral Daily  . arformoterol  15 mcg Nebulization BID  . budesonide  0.25 mg Nebulization BID  . ceFEPime (MAXIPIME) IV  1 g Intravenous Q12H  . [START ON 01/24/2014] furosemide  40 mg Oral Daily  . heparin  5,000 Units Subcutaneous 3 times per day  . olopatadine  1 drop Both Eyes BID  . oxybutynin  2.5 mg Oral BID  . pantoprazole  40 mg Oral BID  . polyethylene glycol  17 g Oral Daily  . sucralfate  1 g Oral BID AC  . zolpidem  5 mg Oral QHS   Continuous Infusions:   Principal Problem:   Aspiration pneumonia Active Problems:   HYPERTENSION   HX OF ESOPHAGEAL CANCER   Hx of esophagectomy   Sepsis   Chronic diastolic CHF (congestive heart failure)   Ferne Reus, PA-S Imogene Burn, PA-C [facility] Pager [phone]. If 7PM-7AM, please contact night-coverage at [email or web address], password [facility] 01/23/2014, 3:29 PM  LOS: 1 day    Addendum  Patient seen and examined, chart and data base reviewed.  I agree with the above assessment and plan.  For full details please see Mrs. Imogene Burn PA note.  I reviewed and amended the above note as needed.   Birdie Hopes, MD [facility] Pager: [phone] 01/23/2014, 4:49 PM

## 2014-01-23 NOTE — Progress Notes (Signed)
ANTIBIOTIC CONSULT NOTE   Pharmacy Consult for cefepime Indication: aspiration PNA  Allergies  Allergen Reactions  . Erythromycin Ethylsuccinate     Irregular pulse rate  . Prednisone     "makes me hyper"  . Doxycycline Rash    Patient Measurements: Height: 4\' 11"  (149.9 cm) Weight: 123 lb 14.4 oz (56.2 kg) IBW/kg (Calculated) : 43.2   Vital Signs: Temp: 98 F (36.7 C) (06/25 0508) Temp src: Oral (06/25 0508) BP: 104/65 mmHg (06/25 0508) Pulse Rate: 59 (06/25 0508) Intake/Output from previous day:   Intake/Output from this shift:    Labs:  Recent Labs  01/22/14 1251  WBC 17.4*  HGB 11.2*  PLT 405*  CREATININE 0.75   Estimated Creatinine Clearance: 41.4 ml/min (by C-G formula based on Cr of 0.75). No results found for this basename: VANCOTROUGH, Corlis Leak, VANCORANDOM, GENTTROUGH, GENTPEAK, GENTRANDOM, TOBRATROUGH, TOBRAPEAK, TOBRARND, AMIKACINPEAK, AMIKACINTROU, AMIKACIN,  in the last 72 hours   Microbiology: Recent Results (from the past 720 hour(s))  URINE CULTURE     Status: None   Collection Time    01/06/14 12:33 PM      Result Value Ref Range Status   Specimen Description URINE, CLEAN CATCH   Final   Special Requests NONE   Final   Culture  Setup Time     Final   Value: 01/06/2014 18:50     Performed at [place]     Final   Value: 75,000 COLONIES/ML     Performed at [facility]   Culture     Final   Value: ESCHERICHIA COLI     Performed at [facility]   Report Status 01/08/2014 FINAL   Final   Organism ID, Bacteria ESCHERICHIA COLI   Final  CULTURE, BLOOD (ROUTINE X 2)     Status: None   Collection Time    01/06/14  8:27 PM      Result Value Ref Range Status   Specimen Description BLOOD LEFT HAND   Final   Special Requests BOTTLES DRAWN AEROBIC ONLY 5CC   Final   Culture  Setup Time     Final   Value: 01/07/2014 02:29     Performed at [facility]   Culture     Final   Value: NO GROWTH  5 DAYS     Performed at [facility]   Report Status 01/13/2014 FINAL   Final  CULTURE, BLOOD (ROUTINE X 2)     Status: None   Collection Time    01/06/14  8:31 PM      Result Value Ref Range Status   Specimen Description BLOOD RIGHT HAND   Final   Special Requests BOTTLES DRAWN AEROBIC ONLY 2CC   Final   Culture  Setup Time     Final   Value: 01/07/2014 02:29     Performed at [facility]   Culture     Final   Value: NO GROWTH 5 DAYS     Performed at [facility]   Report Status 01/13/2014 FINAL   Final  CULTURE, EXPECTORATED SPUTUM-ASSESSMENT     Status: None   Collection Time    01/07/14 10:36 AM      Result Value Ref Range Status   Specimen Description SPUTUM   Final   Special Requests NONE   Final   Sputum evaluation     Final   Value: THIS SPECIMEN IS ACCEPTABLE. RESPIRATORY CULTURE REPORT TO FOLLOW.  Report Status 01/07/2014 FINAL   Final  CULTURE, RESPIRATORY (NON-EXPECTORATED)     Status: None   Collection Time    01/07/14 10:36 AM      Result Value Ref Range Status   Specimen Description SPUTUM   Final   Special Requests NONE   Final   Gram Stain     Final   Value: FEW WBC PRESENT, PREDOMINANTLY PMN     NO SQUAMOUS EPITHELIAL CELLS SEEN     RARE GRAM POSITIVE COCCI     IN PAIRS     Performed at [facility]   Culture     Final   Value: FEW ESCHERICHIA COLI     Performed at [facility]   Report Status 01/09/2014 FINAL   Final   Organism ID, Bacteria ESCHERICHIA COLI   Final  CLOSTRIDIUM DIFFICILE BY PCR     Status: None   Collection Time    01/10/14 11:16 AM      Result Value Ref Range Status   C difficile by pcr NEGATIVE  NEGATIVE Final  URINE CULTURE     Status: None   Collection Time    01/14/14 10:41 PM      Result Value Ref Range Status   Specimen Description URINE, RANDOM   Final   Special Requests NONE   Final   Culture  Setup Time     Final   Value: 01/15/2014 08:41     Performed at [place]     Final   Value: 50,000 COLONIES/ML     Performed at [facility]   Culture     Final   Value: VANCOMYCIN RESISTANT ENTEROCOCCUS ISOLATED     Note: CRITICAL RESULT CALLED TO, READ BACK BY AND VERIFIED WITH: CATHY HAROLD @ DR [place] @ 8:29AM 01/22/14 BY COOKV     Performed at [facility]   Report Status 01/22/2014 FINAL   Final   Organism ID, Bacteria VANCOMYCIN RESISTANT ENTEROCOCCUS ISOLATED   Final    Medical History: Past Medical History  Diagnosis Date  . GERD (gastroesophageal reflux disease)   . Osteoporosis   . Postmenopausal HRT (hormone replacement therapy)   . Anemia   . Allergy   . Asthma   . Shortness of breath   . PONV (postoperative nausea and vomiting)   . Dysrhythmia     a fib  . Chronic bronchitis     "practically q winter" (01/22/2014)  . History of blood transfusion 1993    "related to esophagus removal"  . Arthritis     "legs" (01/06/2014)  . Pneumonia     "all the time; probably 5 times in the last year, counting today" (01/06/2014)  . Aspiration pneumonia 11/22/2013  . H/O hiatal hernia   . Headache(784.0)     "monthly in the past year" (01/22/2014)  . Chronic lower back pain     "maybe cause I fell in the tub on 01/05/2014"  . Urinary frequency   . Esophageal cancer     removed esophagus stomach pulled up; "encapsulated; didn't have to have radiation"  . Basal cell carcinoma of face     "several; freezes them off; face"    Medications:  Scheduled:  . amiodarone  200 mg Oral Daily  . arformoterol  15 mcg Nebulization BID  . budesonide  0.25 mg Nebulization BID  . furosemide  40 mg Oral Daily  . heparin  5,000 Units Subcutaneous  3 times per day  . olopatadine  1 drop Both Eyes BID  . oxybutynin  2.5 mg Oral BID  . pantoprazole  40 mg Oral Daily  . piperacillin-tazobactam (ZOSYN)  IV  3.375 g Intravenous 3 times per day  . polyethylene glycol  17 g Oral Daily  . sucralfate  1 g Oral BID AC  . zolpidem   5 mg Oral QHS     Assessment: 80 yo female with aspiration PNA on zosyn to switch to cefepime. Patient noted with history of recurrent PNA and noted with e. Coli in respiratory cultures 01/07/14 (intermediate to zosyn, sensitive to cefepime and rocephin).  6/25 zosyn>> 6/25 6/25 cefepime  Plan:  -Cefepime 1gm IV q12h -Will follow renal function and clinical progress  Hildred Laser, Pharm D 01/23/2014 1:00 PM

## 2014-01-23 NOTE — Progress Notes (Signed)
Speech Language Pathology  Patient Details Name: [name] MRN: [id number] DOB: [birth year] Today's Date: 01/23/2014 Time:  -5681   MBS completed.  Full report will be documented.  No penetration or aspiration. Recommend: regular texture diet (pt. Won't be as restricted and will eat what she is able to on her tray ) and thin liquids.   [name].Ed [employer] [phone]  01/23/2014

## 2014-01-23 NOTE — Progress Notes (Signed)
Nutrition Brief Note  Patient identified on the Malnutrition Screening Tool (MST) Report. Pt is currently tolerating oral diet well. Eating 75-100% of meals. Declined any oral nutrition supplements at this time.  Wt Readings from Last 15 Encounters:  01/22/14 123 lb 14.4 oz (56.2 kg)  01/09/14 123 lb 10.9 oz (56.1 kg)  01/06/14 115 lb 11.9 oz (52.5 kg)  12/20/13 114 lb (51.71 kg)  12/10/13 114 lb (51.71 kg)  12/02/13 112 lb (50.803 kg)  11/12/13 116 lb (52.617 kg)  11/11/13 115 lb (52.164 kg)  10/28/13 115 lb (52.164 kg)  10/22/13 114 lb (51.71 kg)  10/21/13 114 lb (51.71 kg)  10/14/13 120 lb (54.432 kg)  10/08/13 123 lb (55.792 kg)  10/08/13 125 lb (56.7 kg)  09/15/13 131 lb 8 oz (59.648 kg)    Body mass index is 25.01 kg/(m^2). Patient meets criteria for normal weight based on current BMI.   Current diet order is Regular, patient is consuming approximately 75-100% of meals at this time. Labs and medications reviewed.   No nutrition interventions warranted at this time. If nutrition issues arise, please consult RD.   Inda Coke MS, RD, LDN Inpatient Registered Dietitian Pager: [phone] After-hours pager: [phone]

## 2014-01-23 NOTE — Progress Notes (Signed)
ANTIBIOTIC CONSULT NOTE - INITIAL  Pharmacy Consult for Zosyn  Indication: Aspiration PNA  Allergies  Allergen Reactions  . Erythromycin Ethylsuccinate     Irregular pulse rate  . Prednisone     "makes me hyper"  . Doxycycline Rash    Patient Measurements: Height: 4\' 11"  (149.9 cm) Weight: 123 lb 14.4 oz (56.2 kg) IBW/kg (Calculated) : 43.2  Vital Signs: Temp: 98 F (36.7 C) (06/25 0508) Temp src: Oral (06/25 0508) BP: 104/65 mmHg (06/25 0508) Pulse Rate: 59 (06/25 0508)  Labs:  Recent Labs  01/22/14 1251  WBC 17.4*  HGB 11.2*  PLT 405*  CREATININE 0.75   Estimated Creatinine Clearance: 41.4 ml/min (by C-G formula based on Cr of 0.75).   Medical History: Past Medical History  Diagnosis Date  . GERD (gastroesophageal reflux disease)   . Osteoporosis   . Postmenopausal HRT (hormone replacement therapy)   . Anemia   . Allergy   . Asthma   . Shortness of breath   . PONV (postoperative nausea and vomiting)   . Dysrhythmia     a fib  . Chronic bronchitis     "practically q winter" (01/22/2014)  . History of blood transfusion 1993    "related to esophagus removal"  . Arthritis     "legs" (01/06/2014)  . Pneumonia     "all the time; probably 5 times in the last year, counting today" (01/06/2014)  . Aspiration pneumonia 11/22/2013  . H/O hiatal hernia   . Headache(784.0)     "monthly in the past year" (01/22/2014)  . Chronic lower back pain     "maybe cause I fell in the tub on 01/05/2014"  . Urinary frequency   . Esophageal cancer     removed esophagus stomach pulled up; "encapsulated; didn't have to have radiation"  . Basal cell carcinoma of face     "several; freezes them off; face"    Assessment: 80 y/o to start Zosyn for aspiration PNA. WBC 17.4, renal function appropriate for age, other labs as above.   Plan:  Zosyn 3.375G IV q8h to be infused over 4 hours Trend WBC, temp, renal function  F/U any cultures, imaging   Narda Bonds 01/23/2014,7:20  AM

## 2014-01-23 NOTE — Procedures (Signed)
Objective Swallowing Evaluation: Modified Barium Swallowing Study  Patient Details  Name: [name] MRN: [id number] Date of Birth: 04/07/1929  Today's Date: 01/23/2014 Time: 0950-1010 SLP Time Calculation (min): 20 min  Past Medical History:  Past Medical History  Diagnosis Date  . GERD (gastroesophageal reflux disease)   . Osteoporosis   . Postmenopausal HRT (hormone replacement therapy)   . Anemia   . Allergy   . Asthma   . Shortness of breath   . PONV (postoperative nausea and vomiting)   . Dysrhythmia     a fib  . Chronic bronchitis     "practically q winter" (01/22/2014)  . History of blood transfusion 1993    "related to esophagus removal"  . Arthritis     "legs" (01/06/2014)  . Pneumonia     "all the time; probably 5 times in the last year, counting today" (01/06/2014)  . Aspiration pneumonia 11/22/2013  . H/O hiatal hernia   . Headache(784.0)     "monthly in the past year" (01/22/2014)  . Chronic lower back pain     "maybe cause I fell in the tub on 01/05/2014"  . Urinary frequency   . Esophageal cancer     removed esophagus stomach pulled up; "encapsulated; didn't have to have radiation"  . Basal cell carcinoma of face     "several; freezes them off; face"   Past Surgical History:  Past Surgical History  Procedure Laterality Date  . Left oophorectomy  1952  . Esophageal removal of cancer,gastric ppull-thru 1993    . Forearm fracture surgery Right ?1990's  . Esophagogastroduodenoscopy  10/19/2011    Procedure: ESOPHAGOGASTRODUODENOSCOPY (EGD);  Surgeon: Milus Banister, MD;  Location: [facility];  Service: Endoscopy;  Laterality: N/A;  . Balloon dilation  10/19/2011    Procedure: BALLOON DILATION;  Surgeon: Milus Banister, MD;  Location: [facility];  Service: Endoscopy;  Laterality: N/A;  . Esophagogastroduodenoscopy  11/07/2011    Procedure: ESOPHAGOGASTRODUODENOSCOPY (EGD);  Surgeon: Ladene Artist, MD,FACG;  Location: [facility];  Service: Endoscopy;   Laterality: N/A;  . Esophagogastroduodenoscopy  11/22/2011    Procedure: ESOPHAGOGASTRODUODENOSCOPY (EGD);  Surgeon: Inda Castle, MD;  Location: [facility];  Service: Endoscopy;  Laterality: N/A;  . Esophagogastroduodenoscopy  11/24/2011    Procedure: ESOPHAGOGASTRODUODENOSCOPY (EGD);  Surgeon: Inda Castle, MD;  Location: [facility];  Service: Endoscopy;  Laterality: N/A;  with removable duodenal stent (actually using esophageal partially covered 23X15 located in locked supply room  . Duodenal stent placement  11/24/2011    Procedure: DUODENAL STENT PLACEMENT;  Surgeon: Inda Castle, MD;  Location: [facility];  Service: Endoscopy;  Laterality: N/A;  . Video bronchoscopy Bilateral 01/18/2013    Procedure: VIDEO BRONCHOSCOPY WITHOUT FLUORO;  Surgeon: Tanda Rockers, MD;  Location: [facility];  Service: Cardiopulmonary;  Laterality: Bilateral;  . Esophagogastroduodenoscopy N/A 02/11/2013    Procedure: ESOPHAGOGASTRODUODENOSCOPY (EGD);  Surgeon: Inda Castle, MD;  Location: [facility];  Service: Endoscopy;  Laterality: N/A;  . Duodenal stent placement N/A 02/11/2013    Procedure: DUODENAL STENT PLACEMENT;  Surgeon: Inda Castle, MD;  Location: [facility];  Service: Endoscopy;  Laterality: N/A;  . Esophagogastroduodenoscopy N/A 09/11/2013    Procedure: ESOPHAGOGASTRODUODENOSCOPY (EGD);  Surgeon: Gatha Mayer, MD;  Location: [facility];  Service: Endoscopy;  Laterality: N/A;  . Cholecystectomy  ~ 1996  . Dilation and curettage of uterus  X 6-7  . Cataract extraction w/ intraocular lens  implant, bilateral Bilateral ~ 2000  .  Appendectomy     HPI:  [name] is an 80 y.o. female with history of A fib, HTN, esophageal cancer s/p esophagectomy with gastric pull through, severe GERD, gastroparesis, recurrent aspiration PNA for past 6 months; who was found by her neighbor in her garden tub on 01/06/14. Family reported recent cold type symptoms for the past few weeks with  decrease in po intake as well as recent d/c of coumadin due to hematemesis. CT head without acute changes.  She was started on IV antibiotics for LLL aspiration PNA and therapies ordered. Has had neck tilting towards the left side since her surgery for the esophageal cancer.       Assessment / Plan / Recommendation Clinical Impression  Dysphagia Diagnosis: Mild cervical esophageal phase dysphagia Clinical impression: Pt. exhibited functional oropharyngeal function similar to findings during [facility] 01/13/14.  Laryngeal elevation and anterior excursion resulted in adequate epiglottic delfection and tracheal protection without penetration or aspiration.  Cricopharyngeus muscle is prominent with residue throughout the cervical esophagus that decreases with subsequent sips of thin barium.  Aspiration risk is increased due to superior location of stomach and likely source of repeated pna's.  SLP reviewed esophageal precautions although she appears very compliant and aware of the importance of reflux strategies.  Recommend diet upgrade to regular texture (as she is able to determine textures can tolerate) and thin liquids and strict reflux precautions.  Will continue to follow x 1.    Treatment Recommendation  Therapy as outlined in treatment plan below    Diet Recommendation Regular;Thin liquid   Liquid Administration via: Cup;Straw Medication Administration: Whole meds with liquid Supervision: Patient able to self feed;Intermittent supervision to cue for compensatory strategies Compensations: Slow rate;Follow solids with liquid;Small sips/bites Postural Changes and/or Swallow Maneuvers: Out of bed for meals;Seated upright 90 degrees;Upright 30-60 min after meal    Other  Recommendations Recommended Consults: Consider GI evaluation Oral Care Recommendations: Oral care BID   Follow Up Recommendations  None    Frequency and Duration min 1 x/week  2 weeks   Pertinent Vitals/Pain WDL             Reason for Referral Objectively evaluate swallowing function   Oral Phase Oral Preparation/Oral Phase Oral Phase: WFL   Pharyngeal Phase Pharyngeal Phase Pharyngeal Phase: Within functional limits Pharyngeal - Thin Pharyngeal - Thin Cup: Within functional limits Pharyngeal - Thin Straw: Within functional limits Pharyngeal - Solids Pharyngeal - Puree: Within functional limits Pharyngeal - Regular: Within functional limits  Cervical Esophageal Phase    GO    Cervical Esophageal Phase Cervical Esophageal Phase: Impaired Cervical Esophageal Phase - Thin Thin Cup: Prominent cricopharyngeal segment Thin Straw: Prominent cricopharyngeal segment Cervical Esophageal Phase - Solids Regular: Prominent cricopharyngeal segment         Orbie Pyo Litaker M.Ed [employer] [phone]  01/23/2014

## 2014-01-24 DIAGNOSIS — E871 Hypo-osmolality and hyponatremia: Secondary | ICD-10-CM

## 2014-01-24 DIAGNOSIS — Z8501 Personal history of malignant neoplasm of esophagus: Secondary | ICD-10-CM

## 2014-01-24 LAB — BASIC METABOLIC PANEL
BUN: 11 mg/dL (ref 6–23)
CALCIUM: 8.3 mg/dL — AB (ref 8.4–10.5)
CO2: 30 mEq/L (ref 19–32)
Chloride: 100 mEq/L (ref 96–112)
Creatinine, Ser: 0.83 mg/dL (ref 0.50–1.10)
GFR calc Af Amer: 74 mL/min — ABNORMAL LOW (ref >90)
GFR, EST NON AFRICAN AMERICAN: 64 mL/min — AB (ref >90)
GLUCOSE: 88 mg/dL (ref 70–99)
POTASSIUM: 3.8 meq/L (ref 3.7–5.3)
SODIUM: 140 meq/L (ref 137–147)

## 2014-01-24 LAB — CBC
HCT: 32.9 % — ABNORMAL LOW (ref 36.0–46.0)
Hemoglobin: 10.4 g/dL — ABNORMAL LOW (ref 12.0–15.0)
MCH: 25.7 pg — AB (ref 26.0–34.0)
MCHC: 31.6 g/dL (ref 30.0–36.0)
MCV: 81.4 fL (ref 78.0–100.0)
PLATELETS: 317 10*3/uL (ref 150–400)
RBC: 4.04 MIL/uL (ref 3.87–5.11)
RDW: 17.3 % — ABNORMAL HIGH (ref 11.5–15.5)
WBC: 7.4 10*3/uL (ref 4.0–10.5)

## 2014-01-24 MED ORDER — AMOXICILLIN-POT CLAVULANATE 875-125 MG PO TABS
1.0000 | ORAL_TABLET | Freq: Two times a day (BID) | ORAL | Status: DC
  Administered 2014-01-25 (×2): 1 via ORAL
  Filled 2014-01-24 (×4): qty 1

## 2014-01-24 MED ORDER — MUSCLE RUB 10-15 % EX CREA
TOPICAL_CREAM | CUTANEOUS | Status: DC | PRN
  Filled 2014-01-24: qty 85

## 2014-01-24 MED ORDER — ACETAMINOPHEN 325 MG PO TABS: 650.0000 mg | ORAL_TABLET | Freq: Four times a day (QID) | ORAL | Status: DC | PRN

## 2014-01-24 MED ORDER — PANTOPRAZOLE SODIUM 40 MG PO TBEC
40.0000 mg | DELAYED_RELEASE_TABLET | Freq: Two times a day (BID) | ORAL | Status: DC
  Administered 2014-01-24 – 2014-01-27 (×7): 40 mg via ORAL
  Filled 2014-01-24 (×7): qty 1

## 2014-01-24 NOTE — Progress Notes (Addendum)
Speech Language Pathology Treatment: Dysphagia  Patient Details Name: [name] MRN: [id number] DOB: 1929/03/10 Today's Date: 01/24/2014 Time: 0938-1829 SLP Time Calculation (min): 26 min  Assessment / Plan / Recommendation Clinical Impression  Patient with no overt s/s of aspiration noted with po's, but reports she did have a significant coughing episode for 15-20 minutes that was not related to swallowing/eating this am.  MBS report reviewed by this SLP, with good oropharyngeal swallow function reported.  Question if GI may be of further assist, as aspiration may be due to reflux. Pt is complaining of head and neck pain ("5 or 6").  Eager to work with PT, stating, "I just want to get up and walk. I want to get my strength back."   HPI HPI: [name] is an 80 y.o. female with history of A fib, HTN, esophageal cancer s/p esophagectomy with gastric pull through, severe GERD, gastroparesis, recurrent aspiration PNA for past 6 months; who was found by her neighbor in her garden tub on 01/06/14. Family reported recent cold type symptoms for the past few weeks with decrease in po intake as well as recent d/c of coumadin due to hematemesis. CT head without acute changes.  She was started on IV antibiotics for LLL aspiration PNA and therapies ordered. Has had neck tilting towards the left side since her surgery for the esophageal cancer.     Pertinent Vitals Afebrile; CXR:  Bibasilar atx.  No definite pneumonia.  SLP Plan    D/C ST as goals met.   Recommendations Diet recommendations: Regular;Thin liquid Liquids provided via: Straw;Cup Medication Administration: Whole meds with liquid Supervision: Patient able to self feed;Intermittent supervision to cue for compensatory strategies Compensations: Slow rate;Follow solids with liquid;Small sips/bites Postural Changes and/or Swallow Maneuvers: Out of bed for meals;Seated upright 90 degrees;Upright 30-60 min after meal              Oral Care Recommendations: Oral care BID Follow up Recommendations: None    GO     [name] T 01/24/2014, 3:17 PM

## 2014-01-24 NOTE — Care Management Note (Signed)
    Page 1 of 1   01/24/2014     6:46:31 PM CARE MANAGEMENT NOTE 01/24/2014  Patient:  [name], [name]   Account Number:  [id number]  Date Initiated:  01/24/2014  Documentation initiated by:  Tomi Bamberger  Subjective/Objective Assessment:   dx fever, asp pna  admit- from home     Action/Plan:   pt eval- rec snf   Anticipated DC Date:  01/26/2014   Anticipated DC Plan:  [place]  In-house referral  Clinical Social Worker      DC Planning Services  CM consult      Choice offered to / List presented to:             Status of service:  In process, will continue to follow Medicare Important Message given?  YES (If response is "NO", the following Medicare IM given date fields will be blank) Date Medicare IM given:  01/24/2014 Date Additional Medicare IM given:    Discharge Disposition:    Per UR Regulation:    If discussed at Long Length of Stay Meetings, dates discussed:    Comments:

## 2014-01-24 NOTE — Progress Notes (Signed)
PROGRESS NOTE  APREL EGELHOFF IRC:[id number] DOB: Mar 01, 1929 DOA: 01/22/2014 PCP: Donia Ast, FNP  HPI/Subjective: [name], 80 year old with past medical history significant for asthma, GERD, anemia, Afib, HTN, pneumonia, esophageal cancer, s/p esophagectomy,  presented to the ED with cough x3 days and fever x1 day. She was hospitalized 6/08-6/11 and treated for aspiration pna.  Then she went to CIR until 6/19 for a fall.  In ED, she is found to have her third episode of aspiration pneumonia this year.  States she has a constant productive cough with yellowish tinge, pleuretic chest pain, nausea without vomiting. Denies diarrhea, sore throat, dyspnea, abdominal pain. Also complains of diffuse nonpulsatile headache that starts in frontal region and is continuous to the occipital region. Hasn't had a headache since first daughter was born from straining. Thinks it might be related to straining from cough.   Assessment/Plan:  Principle Problem  Aspiration Pneumonia Presented with cough and fever XRay shows bibasilar atelectasis and no definite pneumonia Possibly due to reflux causing aspiration while lying down.  Increased PPI to bid, Raise HOB, institute reflux precautions. Treated with Vancomycin/Zosyn initially, then switched to IV Cefepime on 6/26 - switch to po Augmentin 6/27 for a total of 14 days  Ordered SLP and Speech pathology - MBS unremarkable.   Advanced to regular Leukocytosis resolved 6/26 [facility] PA Nevin Bloodgood) curb sided for recommendations regarding refractory reflux. (she will get back to me)  Please note - Niece, [name], tries to insist that her aunt be treated with prophylactic antibiotics indefinitely for her pna.  She states that each time she stops the antibiotic 5 days later [name] is sick again.  I told [name] that Ms. Stockdale will likely have longer term antibiotic therapy as this is her third episode of PNA in quick succession, but that  they would not be indefinite.  We discussed risks such as C-dff.  Active Problems Headache Patient did not complain of headache to provider initially.  This was brought up by the niece. Possibly due to cough/straining Had a fall 6/11 and was hospitalized - CT revealed no bleed or hydrocephalus Supportive measures first - ambulate, try NSAIDs and Muscle relaxing cream  Family has recently lost a friend to a brain bleed.  They are insisting on a CT scan to rule this out for Ms. [name].  I have discussed this at length with [name] (neice).  I told her I would not order a CT head today.  The head ache is likely from PNA and we need to try supportive measures first.  If head ache and dizziness are present tomorrow, please reconsider CT head for family.  Hypertension Currently BP is soft. Continue furosemide with hold parameters  Hyponatremia/Hypochloremia Possibly due to low po intake Continue IVF Resolved 6/26  Weakness Chronic decline over 6 months PT Eval ordered and pending   Patient desires CIR after discharge.  Sepsis Possibly due to recurrent aspiration pneumonia  Monitor leukocytosis Lekocytosis resolved 5/10  Chronic Diastolic CHF Stable Continue furosemide  Iron Deficiency Anemia Chronic problem. Takes iron at home.  Held inpatient. Continue to monitor Hematocrit and RBC  Chronic AFib Now in sinus rhythm  Anticoagulation is prohibited per notes with cardiology 01/06/2014 Continue amiodarone.  DVT Prophylaxis:  Heparin  Code Status: presumed full Family Communication: Imogene Burn spoke to Niece [name] on the phone 6/26 Disposition Plan: Inpatient.  Niece [name] strongly desires that her Aunt go to CIR, but she will need SNF if CIR  is not available.   Consultants:  [place], GI  Procedures:  none  Antibiotics: Antibiotics Given (last 72 hours)   Date/Time Action Medication Dose Rate   01/23/14 0906 Given   piperacillin-tazobactam (ZOSYN) IVPB 3.375 g 3.375  g 12.5 mL/hr   01/23/14 1439 Given   ceFEPIme (MAXIPIME) 1 g in dextrose 5 % 50 mL IVPB 1 g 100 mL/hr   01/24/14 0336 Given   ceFEPIme (MAXIPIME) 1 g in dextrose 5 % 50 mL IVPB 1 g 100 mL/hr      Objective: Filed Vitals:   01/23/14 1455 01/23/14 2042 01/24/14 0514 01/24/14 0741  BP: 94/60 93/57 109/70   Pulse: 63 63 70   Temp: 98.4 F (36.9 C) 97.9 F (36.6 C) 98.1 F (36.7 C)   TempSrc: Oral Oral Oral   Resp: 18 18 18    Height:      Weight:      SpO2:  95% 94% 90%    Intake/Output Summary (Last 24 hours) at 01/24/14 1247 Last data filed at 01/24/14 0322  Gross per 24 hour  Intake    240 ml  Output    100 ml  Net    140 ml   Filed Weights   01/22/14 1604 01/22/14 1754  Weight: 56.1 kg (123 lb 10.9 oz) 56.2 kg (123 lb 14.4 oz)    Exam: General: Very pleasant, Thin, Elderly, NAD,  HEENT:  Anicteic Sclera, MMM. No pharyngeal erythema or exudates  Neck: Supple, no lymphadenopathy  Cardiovascular: RRR, no murmurs, rubs or gallops Respiratory: Clear to auscultation bilaterally with equal chest rise - no wheezing  Abdomen: Soft, nontender, nondistended, + bowel sounds  Extremities: + right hand tremor, warm dry without cyanosis or edema.   Skin: Abrasion on upper back and left arm from prior fall  Neuro: Cranial nerves grossly intact, no focal deficit, minor weakness in left arm but patient states that is from the fall Psych: Normal affect and demeanor with intact judgement and insight   Data Reviewed: Basic Metabolic Panel:  Recent Labs Lab 01/22/14 1251 01/24/14 0705  NA 133* 140  K 3.9 3.8  CL 91* 100  CO2 28 30  GLUCOSE 115* 88  BUN 9 11  CREATININE 0.75 0.83  CALCIUM 9.4 8.3*   Liver Function Tests:  Recent Labs Lab 01/22/14 1251  AST 21  ALT 13  ALKPHOS 85  BILITOT 0.5  PROT 6.0  ALBUMIN 3.1*   CBC:  Recent Labs Lab 01/22/14 1251 01/24/14 0705  WBC 17.4* 7.4  NEUTROABS 14.3*  --   HGB 11.2* 10.4*  HCT 35.7* 32.9*  MCV 77.6* 81.4    PLT 405* 317   Cardiac Enzymes:  Recent Labs Lab 01/22/14 1433  TROPONINI <0.30   BNP (last 3 results)  Recent Labs  02/25/13 1108  PROBNP 185.0*   CBG:  Recent Labs Lab 01/18/14 0717 01/18/14 1116  GLUCAP 99 111*    Recent Results (from the past 240 hour(s))  URINE CULTURE     Status: None   Collection Time    01/14/14 10:41 PM      Result Value Ref Range Status   Specimen Description URINE, RANDOM   Final   Special Requests NONE   Final   Culture  Setup Time     Final   Value: 01/15/2014 08:41     Performed at [facility]     Final   Value: 50,000 COLONIES/ML     Performed at  [facility]     Final   Value: VANCOMYCIN RESISTANT ENTEROCOCCUS ISOLATED     Note: CRITICAL RESULT CALLED TO, READ BACK BY AND VERIFIED WITH: CATHY HAROLD @ DR Benay Pillow OFFICE @ 8:29AM 01/22/14 BY COOKV     Performed at [facility]   Report Status 01/22/2014 FINAL   Final   Organism ID, Bacteria VANCOMYCIN RESISTANT ENTEROCOCCUS ISOLATED   Final     Studies: Dg Chest 2 View  01/22/2014   CLINICAL DATA:  Fever  EXAM: CHEST  2 VIEW  COMPARISON:  01/17/2014  FINDINGS: Esophagectomy and gastric pull through. Gastric stent unchanged in position.  Mild left lower lobe atelectasis/ infiltrate is unchanged from the prior study. Mild right lower lobe atelectasis unchanged. No definite pneumonia or effusion. Negative for heart failure.  IMPRESSION: Bibasilar atelectasis unchanged.  No definite pneumonia.   Electronically Signed   By: Franchot Gallo M.D.   On: 01/22/2014 13:49   Dg Swallowing Func-speech Pathology  01/23/2014   Orbie Pyo [place], CCC-SLP     01/23/2014  1:02 PM Objective Swallowing Evaluation: Modified Barium Swallowing Study   Patient Details  Name: GARNELL BEGEMAN MRN: [id number] Date of Birth: 1928/09/11  Today's Date: 01/23/2014 Time: 0950-1010 SLP Time Calculation (min): 20 min  Past Medical History:  Past Medical History   Diagnosis Date  . GERD (gastroesophageal reflux disease)   . Osteoporosis   . Postmenopausal HRT (hormone replacement therapy)   . Anemia   . Allergy   . Asthma   . Shortness of breath   . PONV (postoperative nausea and vomiting)   . Dysrhythmia     a fib  . Chronic bronchitis     "practically q winter" (01/22/2014)  . History of blood transfusion 1993    "related to esophagus removal"  . Arthritis     "legs" (01/06/2014)  . Pneumonia     "all the time; probably 5 times in the last year, counting  today" (01/06/2014)  . Aspiration pneumonia 11/22/2013  . H/O hiatal hernia   . Headache(784.0)     "monthly in the past year" (01/22/2014)  . Chronic lower back pain     "maybe cause I fell in the tub on 01/05/2014"  . Urinary frequency   . Esophageal cancer     removed esophagus stomach pulled up; "encapsulated; didn't have  to have radiation"  . Basal cell carcinoma of face     "several; freezes them off; face"   Past Surgical History:  Past Surgical History  Procedure Laterality Date  . Left oophorectomy  1952  . Esophageal removal of cancer,gastric ppull-thru 1993    . Forearm fracture surgery Right ?1990's  . Esophagogastroduodenoscopy  10/19/2011    Procedure: ESOPHAGOGASTRODUODENOSCOPY (EGD);  Surgeon: Milus Banister, MD;  Location: [facility];  Service: Endoscopy;   Laterality: N/A;  . Balloon dilation  10/19/2011    Procedure: BALLOON DILATION;  Surgeon: Milus Banister, MD;   Location: [facility] ENDOSCOPY;  Service: Endoscopy;  Laterality: N/A;  . Esophagogastroduodenoscopy  11/07/2011    Procedure: ESOPHAGOGASTRODUODENOSCOPY (EGD);  Surgeon: Ladene Artist, MD,FACG;  Location: [facility]COPY;  Service: Endoscopy;   Laterality: N/A;  . Esophagogastroduodenoscopy  11/22/2011    Procedure: ESOPHAGOGASTRODUODENOSCOPY (EGD);  Surgeon: Inda Castle, MD;  Location: [facility];  Service: Endoscopy;   Laterality: N/A;  . Esophagogastroduodenoscopy  11/24/2011    Procedure: ESOPHAGOGASTRODUODENOSCOPY (EGD);  Surgeon: Inda Castle, MD;  Location: [facility]OSCOPY;  Service: Endoscopy;   Laterality: N/A;  with removable duodenal stent (actually using  esophageal partially covered 23X15 located in locked supply room  . Duodenal stent placement  11/24/2011    Procedure: DUODENAL STENT PLACEMENT;  Surgeon: Inda Castle,  MD;  Location: [facility] ENDOSCOPY;  Service: Endoscopy;  Laterality:  N/A;  . Video bronchoscopy Bilateral 01/18/2013    Procedure: VIDEO BRONCHOSCOPY WITHOUT FLUORO;  Surgeon: Tanda Rockers, MD;  Location: [facility] ENDOSCOPY;  Service: Cardiopulmonary;   Laterality: Bilateral;  . Esophagogastroduodenoscopy N/A 02/11/2013    Procedure: ESOPHAGOGASTRODUODENOSCOPY (EGD);  Surgeon: Inda Castle, MD;  Location: [facility];  Service: Endoscopy;   Laterality: N/A;  . Duodenal stent placement N/A 02/11/2013    Procedure: DUODENAL STENT PLACEMENT;  Surgeon: Inda Castle,  MD;  Location: [facility];  Service: Endoscopy;  Laterality:  N/A;  . Esophagogastroduodenoscopy N/A 09/11/2013    Procedure: ESOPHAGOGASTRODUODENOSCOPY (EGD);  Surgeon: Gatha Mayer, MD;  Location: [facility];  Service: Endoscopy;   Laterality: N/A;  . Cholecystectomy  ~ 1996  . Dilation and curettage of uterus  X 6-7  . Cataract extraction w/ intraocular lens  implant, bilateral  Bilateral ~ 2000  . Appendectomy     HPI:  [name] is an 80 y.o. female with history of A fib,  HTN, esophageal cancer s/p esophagectomy with gastric pull  through, severe GERD, gastroparesis, recurrent aspiration PNA for  past 6 months; who was found by her neighbor in her garden tub on  01/06/14. Family reported recent cold type symptoms for the past  few weeks with decrease in po intake as well as recent d/c of  coumadin due to hematemesis. CT head without acute changes.  She  was started on IV antibiotics for LLL aspiration PNA and  therapies ordered. Has had neck tilting towards the left side  since her surgery for the esophageal cancer.       Assessment / Plan /  Recommendation Clinical Impression  Dysphagia Diagnosis: Mild cervical esophageal phase dysphagia Clinical impression: Pt. exhibited functional oropharyngeal  function similar to findings during [facility] 01/13/14.  Laryngeal  elevation and anterior excursion resulted in adequate epiglottic  delfection and tracheal protection without penetration or  aspiration.  Cricopharyngeus muscle is prominent with residue  throughout the cervical esophagus that decreases with subsequent  sips of thin barium.  Aspiration risk is increased due to  superior location of stomach and likely source of repeated pna's.   SLP reviewed esophageal precautions although she appears very  compliant and aware of the importance of reflux strategies.   Recommend diet upgrade to regular texture (as she is able to  determine textures can tolerate) and thin liquids and strict  reflux precautions.  Will continue to follow x 1.    Treatment Recommendation  Therapy as outlined in treatment plan below    Diet Recommendation Regular;Thin liquid   Liquid Administration via: Cup;Straw Medication Administration: Whole meds with liquid Supervision: Patient able to self feed;Intermittent supervision  to cue for compensatory strategies Compensations: Slow rate;Follow solids with liquid;Small  sips/bites Postural Changes and/or Swallow Maneuvers: Out of bed for  meals;Seated upright 90 degrees;Upright 30-60 min after meal    Other  Recommendations Recommended Consults: Consider GI  evaluation Oral Care Recommendations: Oral care BID   Follow Up Recommendations  None    Frequency and Duration min 1 x/week  2 weeks   Pertinent Vitals/Pain WDL  Reason for Referral Objectively evaluate swallowing function   Oral Phase Oral Preparation/Oral Phase Oral Phase: WFL   Pharyngeal Phase Pharyngeal Phase Pharyngeal Phase: Within functional limits Pharyngeal - Thin Pharyngeal - Thin Cup: Within functional limits Pharyngeal - Thin Straw: Within functional limits  Pharyngeal - Solids Pharyngeal - Puree: Within functional limits Pharyngeal - Regular: Within functional limits  Cervical Esophageal Phase    GO    Cervical Esophageal Phase Cervical Esophageal Phase: Impaired Cervical Esophageal Phase - Thin Thin Cup: Prominent cricopharyngeal segment Thin Straw: Prominent cricopharyngeal segment Cervical Esophageal Phase - Solids Regular: Prominent cricopharyngeal segment         Orbie Pyo [place] M.Ed CCC-SLP Pager [phone]  01/23/2014     Scheduled Meds: . amiodarone  200 mg Oral Daily  . arformoterol  15 mcg Nebulization BID  . budesonide  0.25 mg Nebulization BID  . ceFEPime (MAXIPIME) IV  1 g Intravenous Q12H  . furosemide  40 mg Oral Daily  . heparin  5,000 Units Subcutaneous 3 times per day  . olopatadine  1 drop Both Eyes BID  . oxybutynin  2.5 mg Oral BID  . pantoprazole  40 mg Oral BID  . polyethylene glycol  17 g Oral Daily  . sucralfate  1 g Oral BID AC  . zolpidem  5 mg Oral QHS   Continuous Infusions:   Principal Problem:   Aspiration pneumonia Active Problems:   HYPERTENSION   HX OF ESOPHAGEAL CANCER   Hx of esophagectomy   Sepsis   Chronic diastolic CHF (congestive heart failure)   Ferne Reus, PA-S Imogene Burn, PA-C [facility] Pager [phone]. If 7PM-7AM, please contact night-coverage at [email or web address], password [name] 01/24/2014, 12:47 PM  LOS: 2 days   Addendum  Patient seen and examined, chart and data base reviewed.  I agree with the above assessment and plan.  For full details please see Mrs. Imogene Burn PA note.

## 2014-01-24 NOTE — Evaluation (Signed)
Physical Therapy Evaluation Patient Details Name: [name] MRN: [id number] DOB: 01/26/1929 Today's Date: 01/24/2014   History of Present Illness  Pt. admitted 01/22/14 with cough x 3 days and fever.  she was found to have a 3rd bout of aspiration PNA since early this year.  Recent admission for same with stay on CIR, DC'd on 6/19 .  PMH significant for asthma, GERD, anemia, afib, HTN, PNA, esohageal CA with esophagectomy in 1993.     Clinical Impression  Pt. Presents to PT with a decline in her independence of gait and functional mobility due to most recent bout of aspiration PNA.  Pt. Will benefit from acute PT to address these and below issues in preparation for next venue of care.  She does not believe she is strong enough to return home independently at this time and hopes she can go to CIR .  I have discussed with the CIR team and they recommend SNF since it has only been one week since she completed intensive rehab and she is not quite ready for home alone.  SHe mentions that she prefers [facility] in [place].        Follow Up Recommendations SNF;Supervision/Assistance - 24 hour    Equipment Recommendations  None recommended by PT    Recommendations for Other Services       Precautions / Restrictions Precautions Precautions: Fall Precaution Comments: Requires use of RW to decrease risk for falling Restrictions Weight Bearing Restrictions: No      Mobility  Bed Mobility Overal bed mobility:  (not asssessed, pt. up in recliner)                Transfers Overall transfer level: Needs assistance Equipment used: Rolling walker (2 wheeled) Transfers: Sit to/from Stand Sit to Stand: Min guard         General transfer comment: min guard assist for balance to rise to stand from recliaenr  Ambulation/Gait Ambulation/Gait assistance: Min assist Ambulation Distance (Feet): 150 Feet Assistive device: Rolling walker (2 wheeled) Gait Pattern/deviations:  Step-through pattern;Trunk flexed;Narrow base of support Gait velocity: slightly decreased   General Gait Details: infrequent posterior sway  Stairs            Wheelchair Mobility    Modified Rankin (Stroke Patients Only)       Balance Overall balance assessment: Needs assistance Sitting-balance support: No upper extremity supported;Feet supported Sitting balance-Leahy Scale: Fair     Standing balance support: Bilateral upper extremity supported;During functional activity Standing balance-Leahy Scale: Fair Standing balance comment: pt. able to stand briefly at chairside without support of RW                             Pertinent Vitals/Pain See vitals tab No distress    Home Living Family/patient expects to be discharged to:: Skilled nursing facility   Available Help at Discharge: Available PRN/intermittently;Neighbor;Family Type of Home: House Home Access: Stairs to enter Entrance Stairs-Rails: Left Entrance Stairs-Number of Steps: 5 Home Layout: One level Home Equipment: Walker - 2 wheels;Shower seat - built in;Grab bars - toilet;Grab bars - tub/shower;Hand held shower head      Prior Function Level of Independence: Independent with assistive device(s)         Comments: sleeps in recliner     Hand Dominance        Extremity/Trunk Assessment   Upper Extremity Assessment: Generalized weakness  Lower Extremity Assessment: Generalized weakness      Cervical / Trunk Assessment: Kyphotic  Communication   Communication: No difficulties  Cognition Arousal/Alertness: Awake/alert Behavior During Therapy: WFL for tasks assessed/performed Overall Cognitive Status: Within Functional Limits for tasks assessed                      General Comments      Exercises        Assessment/Plan    PT Assessment Patient needs continued PT services  PT Diagnosis Difficulty walking;Generalized weakness   PT Problem List  Decreased strength;Decreased activity tolerance;Decreased balance;Decreased mobility;Decreased knowledge of use of DME;Cardiopulmonary status limiting activity  PT Treatment Interventions DME instruction;Gait training;Functional mobility training;Therapeutic activities;Therapeutic exercise;Balance training;Patient/family education   PT Goals (Current goals can be found in the Care Plan section) Acute Rehab PT Goals Patient Stated Goal: pt. wants to go to CIR to get stronger and if cannot return to CIR would like to go to [facility] SNF PT Goal Formulation: With patient Time For Goal Achievement: 01/31/14 Potential to Achieve Goals: Good    Frequency Min 3X/week   Barriers to discharge Decreased caregiver support      Co-evaluation               End of Session Equipment Utilized During Treatment: Gait belt Activity Tolerance: Patient tolerated treatment well Patient left: in chair;with call bell/phone within reach (RN had not placed pt. on chair alarm) Nurse Communication: Mobility status         Time: 7353-2992 PT Time Calculation (min): 21 min   Charges:   PT Evaluation $Initial PT Evaluation Tier I: 1 Procedure PT Treatments $Gait Training: 8-22 mins   PT G Codes:          Ladona Ridgel 01/24/2014, 3:54 PM Gerlean Ren PT Acute Rehab Services [place] [phone]

## 2014-01-25 MED ORDER — HYDROCOD POLST-CHLORPHEN POLST 10-8 MG/5ML PO LQCR
5.0000 mL | Freq: Two times a day (BID) | ORAL | Status: DC | PRN
  Administered 2014-01-26 (×2): 5 mL via ORAL
  Filled 2014-01-25 (×2): qty 5

## 2014-01-25 NOTE — Progress Notes (Signed)
PROGRESS NOTE  [name] TDD:[id number] DOB: 25-Jul-1929 DOA: 01/22/2014 PCP: Donia Ast, FNP  HPI/Subjective: [name], 80 year old with past medical history significant for asthma, GERD, anemia, Afib, HTN, pneumonia, esophageal cancer, s/p esophagectomy,  presented to the ED with cough x3 days and fever x1 day. She was hospitalized 6/08-6/11 and treated for aspiration pna.  Then she went to CIR until 6/19 for a fall.  In ED, she is found to have her third episode of aspiration pneumonia this year.  She vomited last night, spoke to the patient with presence of her niece at bedside. Questions answered, oxybutynin discontinued. Discharge to SNF versus CIR likely on Monday morning.  Assessment/Plan:  Principle Problem  Aspiration Pneumonia Presented with cough and fever XRay shows bibasilar atelectasis and no definite pneumonia Possibly due to reflux causing aspiration while lying down.  Increased PPI to bid, Raise HOB, institute reflux precautions. Treated with Vancomycin/Zosyn initially, then switched to IV Cefepime on 6/26 - switch to po Augmentin 6/27 for a total of 14 days  Ordered SLP and Speech pathology - MBS unremarkable.   Advanced to regular Leukocytosis resolved 6/26 [facility] PA [name]) curb sided for recommendations regarding refractory reflux. (she will get back to me)  Please note - Niece, [name], tries to insist that her aunt be treated with prophylactic antibiotics indefinitely for her pna.  She states that each time she stops the antibiotic 5 days later [name] is sick again.  I told [name] that [name] will likely have longer term antibiotic therapy as this is her third episode of PNA in quick succession, but that they would not be indefinite.  We discussed risks such as C-dff.  Active Problems Headache Patient did not complain of headache to [name] initially.  This was brought up by the niece. Possibly due to  cough/straining Had a fall 6/11 and was hospitalized - CT revealed no bleed or hydrocephalus Supportive measures first - ambulate, try NSAIDs and Muscle relaxing cream  Family has recently lost a friend to a brain bleed.  They are insisting on a CT scan to rule this out for [name].  I have discussed this at length with [name] (neice).  I told her I would not order a CT head today.  The head ache is likely from PNA and we need to try supportive measures first.  If head ache and dizziness are present tomorrow, please reconsider CT head for family.  Hypertension Currently BP is soft. Continue furosemide with hold parameters  Hyponatremia/Hypochloremia Possibly due to low po intake Continue IVF Resolved 6/26  Weakness Chronic decline over 6 months PT Eval ordered and pending   Patient desires CIR after discharge.  Sepsis Possibly due to recurrent aspiration pneumonia  Monitor leukocytosis Lekocytosis resolved 6/23  Chronic Diastolic CHF Stable Continue furosemide  Iron Deficiency Anemia Chronic problem. Takes iron at home.  Held inpatient. Continue to monitor Hematocrit and RBC  Chronic AFib Now in sinus rhythm  Anticoagulation is prohibited per notes with cardiology 01/06/2014 Continue amiodarone.  DVT Prophylaxis:  Heparin  Code Status: presumed full Family Communication: [name] spoke to Niece [name] on the phone 6/26 Disposition Plan: Inpatient.  Niece [name] strongly desires that her Aunt go to CIR, but she will need SNF if CIR is not available.   Consultants:  [place], [facility]  Procedures:  none  Antibiotics: Antibiotics Given (last 72 hours)   Date/Time Action Medication Dose Rate   01/23/14 0906 Given  piperacillin-tazobactam (ZOSYN) IVPB 3.375 g 3.375 g 12.5 mL/hr   01/23/14 1439 Given   ceFEPIme (MAXIPIME) 1 g in dextrose 5 % 50 mL IVPB 1 g 100 mL/hr   01/24/14 0336 Given   ceFEPIme (MAXIPIME) 1 g in dextrose 5 % 50 mL IVPB 1 g 100 mL/hr   01/24/14  1428 Given   ceFEPIme (MAXIPIME) 1 g in dextrose 5 % 50 mL IVPB 1 g 100 mL/hr   01/25/14 1043 Given   amoxicillin-clavulanate (AUGMENTIN) 875-125 MG per tablet 1 tablet 1 tablet       Objective: Filed Vitals:   01/24/14 2029 01/24/14 2031 01/25/14 0446 01/25/14 0519  BP: 94/59  168/98 120/80  Pulse: 66 66 102   Temp: 98.6 F (37 C)  99.6 F (37.6 C)   TempSrc: Oral  Oral   Resp: 18 18 18    Height:      Weight:      SpO2: 92% 92% 91%     Intake/Output Summary (Last 24 hours) at 01/25/14 1334 Last data filed at 01/25/14 0900  Gross per 24 hour  Intake    200 ml  Output      0 ml  Net    200 ml   Filed Weights   01/22/14 1604 01/22/14 1754  Weight: 56.1 kg (123 lb 10.9 oz) 56.2 kg (123 lb 14.4 oz)    Exam: General: Very pleasant, Thin, Elderly, NAD,  HEENT:  Anicteic Sclera, MMM. No pharyngeal erythema or exudates  Neck: Supple, no lymphadenopathy  Cardiovascular: RRR, no murmurs, rubs or gallops Respiratory: Clear to auscultation bilaterally with equal chest rise - no wheezing  Abdomen: Soft, nontender, nondistended, + bowel sounds  Extremities: + right hand tremor, warm dry without cyanosis or edema.   Skin: Abrasion on upper back and left arm from prior fall  Neuro: Cranial nerves grossly intact, no focal deficit, minor weakness in left arm but patient states that is from the fall Psych: Normal affect and demeanor with intact judgement and insight   Data Reviewed: Basic Metabolic Panel:  Recent Labs Lab 01/22/14 1251 01/24/14 0705  NA 133* 140  K 3.9 3.8  CL 91* 100  CO2 28 30  GLUCOSE 115* 88  BUN 9 11  CREATININE 0.75 0.83  CALCIUM 9.4 8.3*   Liver Function Tests:  Recent Labs Lab 01/22/14 1251  AST 21  ALT 13  ALKPHOS 85  BILITOT 0.5  PROT 6.0  ALBUMIN 3.1*   CBC:  Recent Labs Lab 01/22/14 1251 01/24/14 0705  WBC 17.4* 7.4  NEUTROABS 14.3*  --   HGB 11.2* 10.4*  HCT 35.7* 32.9*  MCV 77.6* 81.4  PLT 405* 317   Cardiac  Enzymes:  Recent Labs Lab 01/22/14 1433  TROPONINI <0.30   BNP (last 3 results)  Recent Labs  02/25/13 1108  PROBNP 185.0*   CBG: No results found for this basename: GLUCAP,  in the last 168 hours  No results found for this or any previous visit (from the past 240 hour(s)).   Studies: No results found.  Scheduled Meds: . amiodarone  200 mg Oral Daily  . amoxicillin-clavulanate  1 tablet Oral Q12H  . arformoterol  15 mcg Nebulization BID  . budesonide  0.25 mg Nebulization BID  . furosemide  40 mg Oral Daily  . heparin  5,000 Units Subcutaneous 3 times per day  . olopatadine  1 drop Both Eyes BID  . pantoprazole  40 mg Oral BID  . polyethylene glycol  17 g Oral Daily  . sucralfate  1 g Oral BID AC  . zolpidem  5 mg Oral QHS   Continuous Infusions:   Principal Problem:   Aspiration pneumonia Active Problems:   HYPERTENSION   HX OF ESOPHAGEAL CANCER   Hx of esophagectomy   Sepsis   Chronic diastolic CHF (congestive heart failure)   Ferne Reus, PA-S Erica Burn, PA-C [facility] Pager [phone]. If 7PM-7AM, please contact night-coverage at [email or web address], password [email or web address] 01/25/2014, 1:34 PM  LOS: 3 days   Addendum  Patient seen and examined, chart and data base reviewed.  I agree with the above assessment and plan.  For full details please see Mrs. Erica Burn PA note.

## 2014-01-26 MED ORDER — AMOXICILLIN-POT CLAVULANATE 400-57 MG/5ML PO SUSR
875.0000 mg | Freq: Two times a day (BID) | ORAL | Status: DC
  Administered 2014-01-26 – 2014-01-27 (×2): 872 mg via ORAL
  Filled 2014-01-26 (×4): qty 10.9

## 2014-01-26 NOTE — Clinical Social Work Placement (Addendum)
Clinical Social Work Department CLINICAL SOCIAL WORK PLACEMENT NOTE 01/26/2014  Patient:  [name], [name]  Account Number:  [id number] Admit date:  01/22/2014  Clinical Social Worker:  Carrington Clamp, [place]  Date/time:  01/26/2014 09:23 AM  Clinical Social Work is seeking post-discharge placement for this patient at the following level of care:   [place]   (*CSW will update this form in Epic as items are completed)   01/26/2014  Patient/family provided with [facility] Department of Clinical Social Work's list of facilities offering this level of care within the geographic area requested by the patient (or if unable, by the patient's family).  01/26/2014  Patient/family informed of their freedom to choose among providers that offer the needed level of care, that participate in Medicare, Medicaid or managed care program needed by the patient, have an available bed and are willing to accept the patient.  01/26/2014  Patient/family informed of [facility]' ownership interest in [facility], as well as of the fact that they are under no obligation to receive care at this facility.  PASARR submitted to EDS on Existing PASARR number received on Existing  FL2 transmitted to all facilities in geographic area requested by pt/family on  01/26/2014 FL2 transmitted to all facilities within larger geographic area on   Patient informed that his/her managed care company has contracts with or will negotiate with  certain facilities, including the following:     Patient/family informed of bed offers received:   Patient chooses bed at  Physician recommends and patient chooses bed at    Patient to be transferred to  on   Patient to be transferred to facility by  Patient and family notified of transfer on  Name of family member notified:    The following physician request were entered in Epic:   Additional Comments:  [place], [place] Weekend  Clinical Social Worker [phone]

## 2014-01-26 NOTE — Clinical Social Work Psychosocial (Signed)
Clinical Social Work Department BRIEF PSYCHOSOCIAL ASSESSMENT 01/26/2014  Patient:  [name], [name]     Account Number:  [id number]     Admit date:  01/22/2014  Clinical Social Worker:  Hubert Azure  Date/Time:  01/26/2014 09:15 AM  Referred by:  Physician  Date Referred:  01/26/2014 Referred for  SNF Placement   Other Referral:   Interview type:  Patient Other interview type:    PSYCHOSOCIAL DATA Living Status:  ALONE Admitted from facility:   Level of care:   Primary support name:  [name] Primary support relationship to patient:  FAMILY Degree of support available:   Good    CURRENT CONCERNS Current Concerns  Post-Acute Placement   Other Concerns:    SOCIAL WORK ASSESSMENT / PLAN CSW met with patient who was alert and oriented x4. CSW introduced self and explained role. CSW discussed d/c plan with patient. Patient stated she is agreeable to SNF placement but only prefers [facility]. Patient stated she is aware she has only been d/c from CIR for one week and will only go to [facility] for rehab. CSW encouraged patient to think about additional SNF options pending availability of preferred facility.   Assessment/plan status:  Psychosocial Support/Ongoing Assessment of Needs Other assessment/ plan:   CSW will update FL2 and family when bed offers are received.   Information/referral to community resources:    PATIENT'S/FAMILY'S RESPONSE TO PLAN OF CARE: Patient was pleasant and agreeable to SNF placement, however patient repeatedly stated, she will only go to [facility].    [place], [place] Weekend Clinical Social Worker [phone]

## 2014-01-26 NOTE — Progress Notes (Signed)
Patient ambulated with walker x2 . No distress noted. She complained of cough prn cough med given

## 2014-01-26 NOTE — Progress Notes (Signed)
PROGRESS NOTE  ELYSIA GRAND UMP:[id number] DOB: 1928/11/30 DOA: 01/22/2014 PCP: Donia Ast, FNP  HPI/Subjective: [name], 80 year old with past medical history significant for asthma, GERD, anemia, Afib, HTN, pneumonia, esophageal cancer, s/p esophagectomy,  presented to the ED with cough x3 days and fever x1 day. She was hospitalized 6/08-6/11 and treated for aspiration pna.  Then she went to CIR until 6/19 for a fall.  In ED, she is found to have her third episode of aspiration pneumonia this year.  Feels much better this morning, no vomiting overnight, breathing is back to normal. Discharge to SNF versus CIR likely in the morning  Assessment/Plan:  Principle Problem  Aspiration Pneumonia Presented with cough and fever XRay shows bibasilar atelectasis and no definite pneumonia Possibly due to reflux causing aspiration while lying down.  Increased PPI to bid, Raise HOB, institute reflux precautions. Treated with Vancomycin/Zosyn initially, then switched to IV Cefepime on 6/26 - switch to po Augmentin 6/27 for a total of 14 days  Ordered SLP and Speech pathology - MBS unremarkable.   Advanced to regular Leukocytosis resolved 6/26 [facility] PA Nevin Bloodgood) curb sided for recommendations regarding refractory reflux. (she will get back to me)  Please note - Niece, [name], tries to insist that her aunt be treated with prophylactic antibiotics indefinitely for her pna.  She states that each time she stops the antibiotic 5 days later Ms. Shelburne is sick again.  I told [name] that Ms. Zehring will likely have longer term antibiotic therapy as this is her third episode of PNA in quick succession, but that they would not be indefinite.  We discussed risks such as C-dff.  Active Problems Headache Patient did not complain of headache to provider initially.  This was brought up by the niece. Possibly due to cough/straining Had a fall 6/11 and was hospitalized - CT revealed  no bleed or hydrocephalus Supportive measures first - ambulate, try NSAIDs and Muscle relaxing cream  Family has recently lost a friend to a brain bleed.  They are insisting on a CT scan to rule this out for [name].  I have discussed this at length with [name] (neice).  I told her I would not order a CT head today.  The head ache is likely from PNA and we need to try supportive measures first.  If head ache and dizziness are present tomorrow, please reconsider CT head for family.  Hypertension Currently BP is soft. Continue furosemide with hold parameters  Hyponatremia/Hypochloremia Possibly due to low po intake Continue IVF Resolved 6/26  Weakness Chronic decline over 6 months PT Eval ordered and pending   Patient desires CIR after discharge.  Sepsis Possibly due to recurrent aspiration pneumonia  Monitor leukocytosis Lekocytosis resolved 4/00  Chronic Diastolic CHF Stable Continue furosemide  Iron Deficiency Anemia Chronic problem. Takes iron at home.  Held inpatient. Continue to monitor Hematocrit and RBC  Chronic AFib Now in sinus rhythm  Anticoagulation is prohibited per notes with cardiology 01/06/2014 Continue amiodarone.  DVT Prophylaxis:  Heparin  Code Status: presumed full Family Communication: Imogene Burn spoke to Niece [name] on the phone 6/26 Disposition Plan: Inpatient.  Niece [name] strongly desires that her Aunt go to CIR, but she will need SNF if CIR is not available.   Consultants:  [place], GI  Procedures:  none  Antibiotics: Antibiotics Given (last 72 hours)   Date/Time Action Medication Dose Rate   01/23/14 1439 Given   ceFEPIme (MAXIPIME) 1 g in dextrose  5 % 50 mL IVPB 1 g 100 mL/hr   01/24/14 0336 Given   ceFEPIme (MAXIPIME) 1 g in dextrose 5 % 50 mL IVPB 1 g 100 mL/hr   01/24/14 1428 Given   ceFEPIme (MAXIPIME) 1 g in dextrose 5 % 50 mL IVPB 1 g 100 mL/hr   01/25/14 1043 Given   amoxicillin-clavulanate (AUGMENTIN) 875-125 MG per  tablet 1 tablet 1 tablet    01/25/14 2115 Given   amoxicillin-clavulanate (AUGMENTIN) 875-125 MG per tablet 1 tablet 1 tablet       Objective: Filed Vitals:   01/25/14 1335 01/25/14 2108 01/26/14 0300 01/26/14 0741  BP: 99/66 96/61 122/75   Pulse: 63 69 80   Temp: 98.5 F (36.9 C) 98.5 F (36.9 C) 98.5 F (36.9 C)   TempSrc: Oral Oral Oral   Resp: 16 16 20    Height:      Weight:      SpO2: 97% 98% 94% 95%    Intake/Output Summary (Last 24 hours) at 01/26/14 1028 Last data filed at 01/26/14 0923  Gross per 24 hour  Intake   1000 ml  Output      0 ml  Net   1000 ml   Filed Weights   01/22/14 1604 01/22/14 1754  Weight: 56.1 kg (123 lb 10.9 oz) 56.2 kg (123 lb 14.4 oz)    Exam: General: Very pleasant, Thin, Elderly, NAD,  HEENT:  Anicteic Sclera, MMM. No pharyngeal erythema or exudates  Neck: Supple, no lymphadenopathy  Cardiovascular: RRR, no murmurs, rubs or gallops Respiratory: Clear to auscultation bilaterally with equal chest rise - no wheezing  Abdomen: Soft, nontender, nondistended, + bowel sounds  Extremities: + right hand tremor, warm dry without cyanosis or edema.   Skin: Abrasion on upper back and left arm from prior fall  Neuro: Cranial nerves grossly intact, no focal deficit, minor weakness in left arm but patient states that is from the fall Psych: Normal affect and demeanor with intact judgement and insight   Data Reviewed: Basic Metabolic Panel:  Recent Labs Lab 01/22/14 1251 01/24/14 0705  NA 133* 140  K 3.9 3.8  CL 91* 100  CO2 28 30  GLUCOSE 115* 88  BUN 9 11  CREATININE 0.75 0.83  CALCIUM 9.4 8.3*   Liver Function Tests:  Recent Labs Lab 01/22/14 1251  AST 21  ALT 13  ALKPHOS 85  BILITOT 0.5  PROT 6.0  ALBUMIN 3.1*   CBC:  Recent Labs Lab 01/22/14 1251 01/24/14 0705  WBC 17.4* 7.4  NEUTROABS 14.3*  --   HGB 11.2* 10.4*  HCT 35.7* 32.9*  MCV 77.6* 81.4  PLT 405* 317   Cardiac Enzymes:  Recent Labs Lab  01/22/14 1433  TROPONINI <0.30   BNP (last 3 results)  Recent Labs  02/25/13 1108  PROBNP 185.0*   CBG: No results found for this basename: GLUCAP,  in the last 168 hours  No results found for this or any previous visit (from the past 240 hour(s)).   Studies: No results found.  Scheduled Meds: . amiodarone  200 mg Oral Daily  . amoxicillin-clavulanate  1 tablet Oral Q12H  . arformoterol  15 mcg Nebulization BID  . budesonide  0.25 mg Nebulization BID  . furosemide  40 mg Oral Daily  . heparin  5,000 Units Subcutaneous 3 times per day  . olopatadine  1 drop Both Eyes BID  . pantoprazole  40 mg Oral BID  . polyethylene glycol  17 g Oral  Daily  . sucralfate  1 g Oral BID AC  . zolpidem  5 mg Oral QHS   Continuous Infusions:   Principal Problem:   Aspiration pneumonia Active Problems:   HYPERTENSION   HX OF ESOPHAGEAL CANCER   Hx of esophagectomy   Sepsis   Chronic diastolic CHF (congestive heart failure)   Ferne Reus, PA-S Imogene Burn, PA-C [facility] Pager [phone]. If 7PM-7AM, please contact night-coverage at [email or web address], password [facility] 01/26/2014, 10:28 AM  LOS: 4 days   Addendum  Patient seen and examined, chart and data base reviewed.  I agree with the above assessment and plan.  For full details please see Mrs. Imogene Burn PA note.

## 2014-01-27 DIAGNOSIS — D509 Iron deficiency anemia, unspecified: Secondary | ICD-10-CM

## 2014-01-27 MED ORDER — AMOXICILLIN-POT CLAVULANATE 400-57 MG/5ML PO SUSR: 875.0000 mg | Freq: Two times a day (BID) | ORAL | Status: DC

## 2014-01-27 MED ORDER — PANTOPRAZOLE SODIUM 40 MG PO TBEC: 40.0000 mg | DELAYED_RELEASE_TABLET | Freq: Two times a day (BID) | ORAL | Status: DC

## 2014-01-27 MED ORDER — TRAMADOL HCL 50 MG PO TABS: 50.0000 mg | ORAL_TABLET | Freq: Three times a day (TID) | ORAL | Status: DC | PRN

## 2014-01-27 NOTE — Discharge Summary (Signed)
Addendum  Patient seen and examined, chart and data base reviewed.  I agree with the above assessment and plan.  For full details please see Mrs. Imogene Burn PA note.  Pneumonia, likely recurrent aspiration pneumonia. Noted that patient had remote esophageal surgery.   Birdie Hopes, MD [facility] Pager: [phone] 01/27/2014, 12:02 PM

## 2014-01-27 NOTE — Discharge Summary (Signed)
Physician Discharge Summary  [name] YWV:[id number] DOB: 08/07/1928 DOA: 01/22/2014  PCP: Donia Ast, FNP  Admit date: 01/22/2014 Discharge date: 01/27/2014  Time spent: 60 minutes  Recommendations for Outpatient Follow-up:  1. Discharged to [facility]  2. Patient with recurrent aspiration pna from reflux.  Use reflux precautions (raise HOB, No eating 2 hours before bedtime) 3. Patient asks to walk after meals 4. Cbc / bmet 02/03/2014  5.  Patient's BP tends to run low.  Please monitor when giving lasix.  Do not give lasix if SBP<100   Discharge Diagnoses:  Principal Problem:   Aspiration pneumonia Active Problems:   HYPERTENSION   HX OF ESOPHAGEAL CANCER   Hx of esophagectomy   Sepsis   Chronic diastolic CHF (congestive heart failure)   Discharge Condition: Stable  Diet recommendation: soft diet.  Patient prefers mashed potatoes and vegetables.  Filed Weights   01/22/14 1604 01/22/14 1754  Weight: 56.1 kg (123 lb 10.9 oz) 56.2 kg (123 lb 14.4 oz)    History of present illness:  [name], 80 year old with past medical history significant for asthma, GERD, anemia, Afib, HTN, pneumonia, esophageal cancer, s/p esophagectomy, presented to the ED with cough x3 days and fever x1 day. She was hospitalized 6/08-6/11 and treated for aspiration pna. Then she went to [facility] until 6/19 for a fall. In ED, she is found to have her third episode of aspiration pneumonia this year.  Speech studies are always completely normal.  Feels much better this morning. Denies vomiting and dyspnea. States headache is still present but that Tramadol relieved the pain.   Hospital Course:  Principle Problem   Aspiration Pneumonia secondary to reflux Presented with cough and fever  XRay showed bibasilar atelectasis and no definite pneumonia  Possibly due to reflux causing aspiration while lying down. Increased PPI to bid, Raise HOB, institute reflux precautions.  Night time  medications reduced (tussionex and oxybutynin eliminated) Treated with Vancomycin/Zosyn initially, then switched to IV Cefepime on 6/26 - switched to po Augmentin 6/28 Leukocytosis resolved 6/26 Ordered SLP and Speech pathology - MBS unremarkable.  Repeat Speech Eval is completely normal this admission. Advanced to regular  Prescribed po Augmentin x total 14 days   Active Problems  Headache  Possibly due to cough/straining  Had a fall 6/11 and was hospitalized - CT revealed no bleed or hydrocephalus  Supportive measures tried first - ambulated, Tylenol and Muscle relaxing cream - switch to Tramadol which relieved pain. Prescribed po Tramadol at discharge.  Hypertension  Currently BP is soft.  Furosemide continued.   Hyponatremia/Hypochloremia  Possibly due to low po intake  Continue IVF  Resolved 6/26   Weakness  Chronic decline over 6 months  PT Eval ordered and recommended SNF  Sepsis  Due to recurrent aspiration pneumonia.  Now resolved. Treated with IVF and IV antibiotics. Lekocytosis resolved 6/26   Chronic Diastolic CHF  Stable  Continued furosemide with holding parameters  Iron Deficiency Anemia  Chronic problem. Takes iron at home. Held inpatient. Resume at outpatient.  Chronic AFib  Now in sinus rhythm  Anticoagulation is prohibited per notes with cardiology 01/06/2014  Continued amiodarone.   Procedures:  None  Consultations:  [place], GI  Discharge Exam: Filed Vitals:   01/27/14 0402  BP: 92/56  Pulse: 65  Temp: 98.7 F (37.1 C)  Resp: 16    General: Thin, Elderly, NAD, resting comfortably.  Niece at bedside HEENT: Anicteic Sclera, MMM. No pharyngeal erythema or exudates  Neck: Supple, no masses or lymphadenopathy Cardiovascular: RRR, S1 and S2 auscultated, no murmurs, rubs or gallops  Respiratory: Clear to auscultation bilaterally with equal chest rise - no wheezing or crackles Abdomen: Soft, nontender, nondistended, + bowel sounds   Extremities: + right hand tremor, warm dry without cyanosis or edema.  Skin: Abrasion on upper back and left arm from prior fall  Neuro: No focal deficit, minor weakness in left arm but patient states that is from the fall  Psych: Normal affect and demeanor with intact judgement and insight   Discharge Instructions      Discharge Instructions   Diet - low sodium heart healthy    Complete by:  As directed      Increase activity slowly    Complete by:  As directed             Medication List    STOP taking these medications       chlorpheniramine-HYDROcodone 10-8 MG/5ML Lqcr  Commonly known as:  TUSSIONEX PENNKINETIC ER     oxybutynin 5 MG tablet  Commonly known as:  DITROPAN     PREVACID PO  Replaced by:  pantoprazole 40 MG tablet      TAKE these medications       acetaminophen 500 MG tablet  Commonly known as:  TYLENOL  Take 1 tablet (500 mg total) by mouth every 6 (six) hours as needed for headache.     albuterol 108 (90 BASE) MCG/ACT inhaler  Commonly known as:  PROVENTIL HFA;VENTOLIN HFA  Inhale 2 puffs into the lungs every 6 (six) hours as needed. For wheezing     albuterol (2.5 MG/3ML) 0.083% nebulizer solution  Commonly known as:  PROVENTIL  Take 3 mLs (2.5 mg total) by nebulization every 4 (four) hours as needed for wheezing or shortness of breath.     amiodarone 200 MG tablet  Commonly known as:  PACERONE  Take 200 mg by mouth daily.     amoxicillin-clavulanate 400-57 MG/5ML suspension  Commonly known as:  AUGMENTIN  Take 10.9 mLs (872 mg total) by mouth 2 (two) times daily before a meal. Breakfast and Evening meal (Supper)     arformoterol 15 MCG/2ML Nebu  Commonly known as:  BROVANA  Take 2 mLs (15 mcg total) by nebulization 2 (two) times daily. Dx: 493.00     B-12 5000 MCG Tbdp  Take 1 tablet by mouth daily.     budesonide 0.25 MG/2ML nebulizer solution  Commonly known as:  PULMICORT  Take 2 mLs (0.25 mg total) by nebulization 2 (two) times  daily. Dx: 493.00     ergocalciferol 50000 UNITS capsule  Commonly known as:  VITAMIN D2  Take 50,000 Units by mouth every Friday.     furosemide 40 MG tablet  Commonly known as:  LASIX  Take 1 tablet (40 mg total) by mouth daily.     HEMOCYTE PLUS 106-1 MG Caps  Take 1 tablet by mouth 2 (two) times daily.     MUCINEX CHILDRENS PO  Take 0.5 tablets by mouth 2 (two) times daily.     pantoprazole 40 MG tablet  Commonly known as:  PROTONIX  Take 1 tablet (40 mg total) by mouth 2 (two) times daily. Give once before breakfast and once before bed time.     PATADAY 0.2 % Soln  Generic drug:  Olopatadine HCl  Apply 1-2 drops to eye 2 (two) times daily as needed (dry, itchy eyes.).     promethazine 25 MG  suppository  Commonly known as:  PHENERGAN  Place 25 mg rectally every 6 (six) hours as needed for nausea or vomiting.     sucralfate 1 GM/10ML suspension  Commonly known as:  CARAFATE  Take 10 mLs (1 g total) by mouth 2 (two) times daily before a meal.     traMADol 50 MG tablet  Commonly known as:  ULTRAM  Take 1 tablet (50 mg total) by mouth every 8 (eight) hours as needed for moderate pain.     VIACTIV MULTI-VITAMIN Chew  Chew 1 each by mouth daily.     zolpidem 5 MG tablet  Commonly known as:  AMBIEN  Take 5 mg by mouth at bedtime.       Allergies  Allergen Reactions  . Ensure [Nutritional Supplements]     "gives me diarrhea"  . Erythromycin Ethylsuccinate     Irregular pulse rate  . Prednisone     "makes me hyper"  . Doxycycline Rash   Follow-up Information   Follow up with CAMPBELL, [place], FNP In 1 week.   Specialty:  Family Medicine   Contact information:   [place] [address] [phone]        The results of significant diagnostics from this hospitalization (including imaging, microbiology, ancillary and laboratory) are listed below for reference.    Significant Diagnostic Studies: Dg Chest 2 View  01/22/2014    CLINICAL DATA:  Fever  EXAM: CHEST  2 VIEW  COMPARISON:  01/17/2014  FINDINGS: Esophagectomy and gastric pull through. Gastric stent unchanged in position.  Mild left lower lobe atelectasis/ infiltrate is unchanged from the prior study. Mild right lower lobe atelectasis unchanged. No definite pneumonia or effusion. Negative for heart failure.  IMPRESSION: Bibasilar atelectasis unchanged.  No definite pneumonia.   Electronically Signed   By: Franchot Gallo M.D.   On: 01/22/2014 13:49  Dg Swallowing Func-speech Pathology  01/13/2014   Selinda Orion Page, CCC-SLP     01/13/2014  4:33 PM   Objective Swallowing Evaluation: Modified Barium Swallowing Study   Patient Details  Name: MONTSERRATH MADDING MRN: [id number] Date of Birth: 1928/11/18  Today's Date: 01/13/2014 Time: 2952-8413 Time Calculation (min): 41 min  Past Medical History:  Past Medical History  Diagnosis Date  . GERD (gastroesophageal reflux disease)   . Osteoporosis   . Postmenopausal HRT (hormone replacement therapy)   . Anemia   . Hypertension   . Allergy   . Asthma   . Shortness of breath   . PONV (postoperative nausea and vomiting)   . Dysrhythmia     a fib  . Esophageal cancer     removed esophagus stomach pulled up  . Basal cell carcinoma of face     "several; freezes them off"  . Pneumonia     "all the time; probably 5 times in the last year, counting  today" (01/06/2014)  . Chronic bronchitis     "practically q winter" (01/06/2014)  . History of blood transfusion 1993    "related to esophagus removal" (01/06/2014)  . Headache(784.0)     "monthly in the past year" (01/06/2014)  . Arthritis     "legs" (01/06/2014)   Past Surgical History:  Past Surgical History  Procedure Laterality Date  . Left oophorectomy  1952  . Esophageal removal of cancer,gastric ppull-thru 1993    . Forearm fracture surgery Right ?1990's  . Esophagogastroduodenoscopy  10/19/2011    Procedure: ESOPHAGOGASTRODUODENOSCOPY (EGD);  Surgeon: Milus Banister, MD;  Location: [facility] ENDOSCOPY;  Service:  Endoscopy;   Laterality: N/A;  . Balloon dilation  10/19/2011    Procedure: BALLOON DILATION;  Surgeon: Milus Banister, MD;   Location: [facility] ENDOSCOPY;  Service: Endoscopy;  Laterality: N/A;  . Esophagogastroduodenoscopy  11/07/2011    Procedure: ESOPHAGOGASTRODUODENOSCOPY (EGD);  Surgeon: Ladene Artist, MD,FACG;  Location: [facility] ENDOSCOPY;  Service: Endoscopy;   Laterality: N/A;  . Esophagogastroduodenoscopy  11/22/2011    Procedure: ESOPHAGOGASTRODUODENOSCOPY (EGD);  Surgeon: Inda Castle, MD;  Location: [facility];  Service: Endoscopy;   Laterality: N/A;  . Esophagogastroduodenoscopy  11/24/2011    Procedure: ESOPHAGOGASTRODUODENOSCOPY (EGD);  Surgeon: Inda Castle, MD;  Location: [facility] ENDOSCOPY;  Service: Endoscopy;   Laterality: N/A;  with removable duodenal stent (actually using  esophageal partially covered 23X15 located in locked supply room  . Duodenal stent placement  11/24/2011    Procedure: DUODENAL STENT PLACEMENT;  Surgeon: Inda Castle,  MD;  Location: [facility] ENDOSCOPY;  Service: Endoscopy;  Laterality:  N/A;  . Video bronchoscopy Bilateral 01/18/2013    Procedure: VIDEO BRONCHOSCOPY WITHOUT FLUORO;  Surgeon: Tanda Rockers, MD;  Location: [facility] ENDOSCOPY;  Service: Cardiopulmonary;   Laterality: Bilateral;  . Esophagogastroduodenoscopy N/A 02/11/2013    Procedure: ESOPHAGOGASTRODUODENOSCOPY (EGD);  Surgeon: Inda Castle, MD;  Location: [facility];  Service: Endoscopy;   Laterality: N/A;  . Duodenal stent placement N/A 02/11/2013    Procedure: DUODENAL STENT PLACEMENT;  Surgeon: Inda Castle,  MD;  Location: [facility]OSCOPY;  Service: Endoscopy;  Laterality:  N/A;  . Esophagogastroduodenoscopy N/A 09/11/2013    Procedure: ESOPHAGOGASTRODUODENOSCOPY (EGD);  Surgeon: Gatha Mayer, MD;  Location: [facility];  Service: Endoscopy;   Laterality: N/A;  . Cholecystectomy  ~ 1996  . Dilation and curettage of uterus  X 6-7  . Cataract extraction w/ intraocular lens  implant, bilateral  Bilateral ~ 2000    HPI:  TANNIA CONTINO is an 80 y.o. female with history of A fib,  HTN, esophageal cancer s/p esophagectomy with gastric pull  through, severe GERD, gastroparesis, recurrent aspiration PNA for  past 6 months; who was found by her neighbor in her garden tub on  01/06/14. Family reported recent cold type symptoms for the past  few weeks with decrease in po intake as well as recent d/c of  coumadin due to hematemesis. CT head without acute changes.  She  was started on IV antibiotics for LLL aspiration PNA and  therapies ordered. Has had neck tilting towards the left side  since her surgery for the esophageal cancer.       Recommendation/Prognosis  Clinical Impression:   Dysphagia Diagnosis: Moderate cervical esophageal phase  dysphagia Pt presents with an overall functional oropharyngeal swallowing  mechanism.  Deviation from normal oropharyngeal physiology  included a mild delay in swallow initiation to the level of the  vallecular which did not appear to impact the overall integrity  of the swallow.   Most remarkable on this exam was evidence of  post-surgical changes from esophagectomy in 1993.  Difficult to  ascertain baseline swallowing function based on today's objective  swallow study and no history of MBS is available in chart.   Pt  has what appears to be similar to a CP bar at c5-c6 junction  resulting in moderate residue of both solids and liquids.   Residue was cleared to minimal-mild with multiple swallows and/or  thin liquid wash and did not impact the pt's swallowing  safety,  therefore MD was not consulted at this time.  Given pt's  complicated and prolonged GI history, pt remains at an increased  risk of aspiration; however, pt is effectively protecting her  airway and no aspiration was visualized on this study.   Pt would  benefit from GI follow up as SLP suspects this is likely the  primary contributing factor to pt's recurrent aspiration  pneumonia versus pharyngeal strength and/or coordination.     Swallow Evaluation Recommendations:  Recommended Consults: Consider GI evaluation Diet Recommendations: Regular;Thin liquid Liquid Administration via: Cup;Straw Supervision: Full supervision/cueing for compensatory strategies  (RN reported on initial eval that family requests full  supervision during meals ) Compensations: Slow rate;Follow solids with liquid;Small  sips/bites Postural Changes and/or Swallow Maneuvers: Out of bed for  meals;Seated upright 90 degrees;Upright 30-60 min after meal Oral Care Recommendations: Oral care BID Follow up Recommendations: None    Prognosis:  Prognosis for Safe Diet Advancement: Good Barriers to Reach Goals: Time post onset   Individuals Consulted: Consulted and Agree with Results and  Recommendations: Patient Report Sent to : Referring physician      SLP Assessment/Plan  Plan:   Please see treatment plan outlined in IPOC   Short Term Goals: Week 1: SLP Short Term Goal 1 (Week 1): Pt will  tolerate her currently prescribed diet with no overt s/s of  aspiration with modified independence.  SLP Short Term Goal 2 (Week 1): Pt will participate in an  objective swallow study to determine safest diet consistency.      General: Date of Onset: 01/06/14 Type of Study: Modified Barium Swallowing Study Reason for Referral: Objectively evaluate swallowing function Previous Swallow Assessment: pt reports history of "swallow  tests" in the setting of esophagectomy, no MBS noted per chart  review Diet Prior to this Study: Regular;Thin liquids Temperature Spikes Noted: No Respiratory Status: Room air History of Recent Intubation: No Behavior/Cognition: Alert;Cooperative;Pleasant mood Oral Cavity - Dentition: Adequate natural dentition Oral Motor / Sensory Function: Within functional limits Self-Feeding Abilities: Able to feed self Patient Positioning: Upright in chair Baseline Vocal Quality: Clear Volitional Cough: Strong Anatomy: Other (Comment) (C2-C3 and C5-C7 boney prominences)  Pharyngeal Secretions: Not observed secondary MBS   Reason for Referral:   Objectively evaluate swallowing function    Oral Phase: Oral Preparation/Oral Phase Oral Phase: WFL   Pharyngeal Phase:  Pharyngeal Phase Pharyngeal Phase: Within functional limits   Cervical Esophageal Phase  Cervical Esophageal Phase Cervical Esophageal Phase: Impaired Cervical Esophageal Phase - Thin Thin Cup: Prominent cricopharyngeal segment;Other (Comment)  (anterior protusion at the level of the cricopharyngeal segment) Thin Straw: Prominent cricopharyngeal segment;Other (Comment)  (anterior protusion at the level of the cricopharyngeal segment) Cervical Esophageal Phase - Solids Puree: Prominent cricopharyngeal segment;Other (Comment)  (anterior protusion at the level of the cricopharyngeal segment) Regular: Prominent cricopharyngeal segment;Other (Comment)  (anterior protusion at the level of the cricopharyngeal segment)   GN        Windell Moulding, M.A. CCC-SLP  Page, Selinda Orion 01/13/2014, 10:30 AM                    Labs: Basic Metabolic Panel:  Recent Labs Lab 01/22/14 1251 01/24/14 0705  NA 133* 140  K 3.9 3.8  CL 91* 100  CO2 28 30  GLUCOSE 115* 88  BUN 9 11  CREATININE 0.75 0.83  CALCIUM 9.4 8.3*   Liver Function Tests:  Recent Labs Lab 01/22/14 1251  AST 21  ALT 13  ALKPHOS 85  BILITOT 0.5  PROT 6.0  ALBUMIN 3.1*   CBC:  Recent Labs Lab 01/22/14 1251 01/24/14 0705  WBC 17.4* 7.4  NEUTROABS 14.3*  --   HGB 11.2* 10.4*  HCT 35.7* 32.9*  MCV 77.6* 81.4  PLT 405* 317   Cardiac Enzymes:  Recent Labs Lab 01/22/14 1433  TROPONINI <0.30   BNP: BNP (last 3 results)  Recent Labs  02/25/13 1108  PROBNP 185.0*      Signed:  Ferne Reus, PA-S  [place], [place] [phone] [facility] 01/27/2014, 11:31 AM

## 2014-01-27 NOTE — Progress Notes (Signed)
NURSING PROGRESS NOTE  [name] [id number] Discharge Data: 01/27/2014 1:19 PM Attending Provider: Verlee Monte, MD QVZ:[id number], Hillard Danker, FNP     Vickey Sages to be D/C'd Skilled nursing facility per MD order.  Discussed with the patient the After Visit Summary and all questions fully answered. All IV's discontinued with no bleeding noted. All belongings returned to patient for patient to take home.   Last Vital Signs:  Blood pressure 92/56, pulse 65, temperature 98.7 F (37.1 C), temperature source Oral, resp. rate 16, height 4\' 11"  (1.499 m), weight 56.2 kg (123 lb 14.4 oz), SpO2 98.00%.  Discharge Medication List   Medication List    STOP taking these medications       chlorpheniramine-HYDROcodone 10-8 MG/5ML Lqcr  Commonly known as:  TUSSIONEX PENNKINETIC ER     oxybutynin 5 MG tablet  Commonly known as:  DITROPAN     PREVACID PO  Replaced by:  pantoprazole 40 MG tablet      TAKE these medications       acetaminophen 500 MG tablet  Commonly known as:  TYLENOL  Take 1 tablet (500 mg total) by mouth every 6 (six) hours as needed for headache.     albuterol 108 (90 BASE) MCG/ACT inhaler  Commonly known as:  PROVENTIL HFA;VENTOLIN HFA  Inhale 2 puffs into the lungs every 6 (six) hours as needed. For wheezing     albuterol (2.5 MG/3ML) 0.083% nebulizer solution  Commonly known as:  PROVENTIL  Take 3 mLs (2.5 mg total) by nebulization every 4 (four) hours as needed for wheezing or shortness of breath.     amiodarone 200 MG tablet  Commonly known as:  PACERONE  Take 200 mg by mouth daily.     amoxicillin-clavulanate 400-57 MG/5ML suspension  Commonly known as:  AUGMENTIN  Take 10.9 mLs (872 mg total) by mouth 2 (two) times daily before a meal. Breakfast and Evening meal (Supper)     arformoterol 15 MCG/2ML Nebu  Commonly known as:  BROVANA  Take 2 mLs (15 mcg total) by nebulization 2 (two) times daily. Dx: 493.00     B-12 5000 MCG Tbdp  Take  1 tablet by mouth daily.     budesonide 0.25 MG/2ML nebulizer solution  Commonly known as:  PULMICORT  Take 2 mLs (0.25 mg total) by nebulization 2 (two) times daily. Dx: 493.00     ergocalciferol 50000 UNITS capsule  Commonly known as:  VITAMIN D2  Take 50,000 Units by mouth every Friday.     furosemide 40 MG tablet  Commonly known as:  LASIX  Take 1 tablet (40 mg total) by mouth daily.     HEMOCYTE PLUS 106-1 MG Caps  Take 1 tablet by mouth 2 (two) times daily.     MUCINEX CHILDRENS PO  Take 0.5 tablets by mouth 2 (two) times daily.     pantoprazole 40 MG tablet  Commonly known as:  PROTONIX  Take 1 tablet (40 mg total) by mouth 2 (two) times daily. Give once before breakfast and once before bed time.     PATADAY 0.2 % Soln  Generic drug:  Olopatadine HCl  Apply 1-2 drops to eye 2 (two) times daily as needed (dry, itchy eyes.).     promethazine 25 MG suppository  Commonly known as:  PHENERGAN  Place 25 mg rectally every 6 (six) hours as needed for nausea or vomiting.     sucralfate 1 GM/10ML suspension  Commonly known as:  CARAFATE  Take 10 mLs (1 g total) by mouth 2 (two) times daily before a meal.     traMADol 50 MG tablet  Commonly known as:  ULTRAM  Take 1 tablet (50 mg total) by mouth every 8 (eight) hours as needed for moderate pain.     VIACTIV MULTI-VITAMIN Chew  Chew 1 each by mouth daily.     zolpidem 5 MG tablet  Commonly known as:  AMBIEN  Take 5 mg by mouth at bedtime.

## 2014-01-27 NOTE — Clinical Social Work Placement (Signed)
Clinical Social Work Department CLINICAL SOCIAL WORK PLACEMENT NOTE 01/27/2014  Patient:  [name], [name]  Account Number:  [id number] Admit date:  01/22/2014  Clinical Social Worker:  Carrington Clamp, [place]  Date/time:  01/26/2014 09:23 AM  Clinical Social Work is seeking post-discharge placement for this patient at the following level of care:   [place]   (*CSW will update this form in Epic as items are completed)   01/26/2014  Patient/family provided with [place] Department of Clinical Social Work's list of facilities offering this level of care within the geographic area requested by the patient (or if unable, by the patient's family).  01/26/2014  Patient/family informed of their freedom to choose among providers that offer the needed level of care, that participate in Medicare, Medicaid or managed care program needed by the patient, have an available bed and are willing to accept the patient.  01/26/2014  Patient/family informed of [facility]' ownership interest in [facility], as well as of the fact that they are under no obligation to receive care at this facility.  PASARR submitted to EDS on  PASARR number received on   FL2 transmitted to all facilities in geographic area requested by pt/family on  01/26/2014 FL2 transmitted to all facilities within larger geographic area on   Patient informed that his/her managed care company has contracts with or will negotiate with  certain facilities, including the following:     Patient/family informed of bed offers received:  01/27/2014 Patient chooses bed at [facility] Physician recommends and patient chooses bed at    Patient to be transferred to [facility] on  01/27/2014 Patient to be transferred to facility by Ambulance Patient and family notified of transfer on 01/27/2014 Name of family member notified:  [name]  The following physician request were entered in  Epic:   Additional Comments: Per MD patient ready to Dc to [facility]. RN, patient, patient's family, and facility aware of DC. RN given number for report. DC packet on chart. Ambulance transport requested. CSW signing off.   Liz Beach MSW, [place], [place], [phone]

## 2014-01-27 NOTE — Clinical Social Work Note (Signed)
Ambulance transport requested for patient for 1:30pm.  Liz Beach MSW, [place], [phone]

## 2014-01-28 ENCOUNTER — Telehealth: Payer: Self-pay | Admitting: Internal Medicine

## 2014-01-28 NOTE — Telephone Encounter (Signed)
[place] to speak with Nina-nurse over patient care Gae Bon stated that she could not help me with this request to change how the patient is taking neb meds. I was then transferred to the [place] with [place] States that the On-Site Physician and Pharmacist are both refusing to make this mediation change as requested d/t the fact that mixing the two medications can decreases the effectiveness of the other. Tonya proceeded on stating that this is in the contraindication list and would like to know if Dr Melvyn Novas is aware of this contraindication. Requesting a note to be written stating that Dr Melvyn Novas is aware of the contraindication and wishes to proceed with this medication change as a verbal order will not suffice. Kenney Houseman states that the Medical Director will then have to review this medication change and approve this before it can be changed  Fax # [phone] ATTN: Kenney Houseman  Will review this with Medical Director once received and will contact our office with decision.  Please advise Dr Melvyn Novas. Thanks.

## 2014-01-28 NOTE — Telephone Encounter (Signed)
Fine to write a letter and I will sign it. They are mistaken regarding any contraindications to using the meds at the same time

## 2014-01-28 NOTE — Telephone Encounter (Signed)
Letter written and placed on Dr Melvyn Novas desk to sign Needs to be faxed to ATTN: Kenney Houseman Fax # [phone]   Will send to [facility] to follow up on. Thanks!

## 2014-01-28 NOTE — Telephone Encounter (Signed)
Letter faxed to [facility]  ATTN: Kenney Houseman  Will await call back from [place]  Will hold in triage for decision from [facility]

## 2014-01-28 NOTE — Telephone Encounter (Signed)
Called spoke with patient's daughter Precious Bard who reports that pt's living facility [facility] in [place]) has been administering pt's Pulmicort at 8am and pm, then the [facility] at [place] and pm.  Precious Bard would like to know if an order can ben sent to administer these meds together at the same time to make it more convenient/easy for the patient.  Please advise Dr Melvyn Novas, thank you!  [facility] >> [phone]; pt's nurse is Gae Bon.

## 2014-01-28 NOTE — Telephone Encounter (Signed)
Yes they should be given same time

## 2014-01-29 ENCOUNTER — Ambulatory Visit: Payer: Medicare Other | Admitting: Family

## 2014-02-02 DIAGNOSIS — E119 Type 2 diabetes mellitus without complications: Secondary | ICD-10-CM

## 2014-02-02 DIAGNOSIS — C159 Malignant neoplasm of esophagus, unspecified: Secondary | ICD-10-CM

## 2014-02-02 DIAGNOSIS — I4891 Unspecified atrial fibrillation: Secondary | ICD-10-CM

## 2014-02-02 DIAGNOSIS — K219 Gastro-esophageal reflux disease without esophagitis: Secondary | ICD-10-CM

## 2014-02-02 DIAGNOSIS — Z8701 Personal history of pneumonia (recurrent): Secondary | ICD-10-CM

## 2014-02-02 DIAGNOSIS — S41009A Unspecified open wound of unspecified shoulder, initial encounter: Secondary | ICD-10-CM

## 2014-02-02 DIAGNOSIS — Z9181 History of falling: Secondary | ICD-10-CM

## 2014-02-02 DIAGNOSIS — D649 Anemia, unspecified: Secondary | ICD-10-CM

## 2014-02-02 DIAGNOSIS — J45909 Unspecified asthma, uncomplicated: Secondary | ICD-10-CM

## 2014-02-02 DIAGNOSIS — S51009A Unspecified open wound of unspecified elbow, initial encounter: Secondary | ICD-10-CM | POA: Diagnosis not present

## 2014-02-05 ENCOUNTER — Encounter: Payer: Medicare Other | Admitting: Nurse Practitioner

## 2014-02-06 ENCOUNTER — Ambulatory Visit: Payer: Medicare Other | Admitting: Family

## 2014-02-06 ENCOUNTER — Telehealth: Payer: Self-pay | Admitting: Gastroenterology

## 2014-02-06 NOTE — Telephone Encounter (Signed)
Pt having difficulty swallowing, request pt be seen next week. Pt scheduled to see Tye Savoy NP 02/14/14@2pm . Pt aware of appt.

## 2014-02-09 ENCOUNTER — Emergency Department (HOSPITAL_COMMUNITY): Payer: Medicare Other

## 2014-02-09 ENCOUNTER — Inpatient Hospital Stay (HOSPITAL_COMMUNITY)
Admission: EM | Admit: 2014-02-09 | Discharge: 2014-02-12 | DRG: 177 | Disposition: A | Discharge status: Skilled Nursing Facility | Payer: Medicare Other | Attending: Internal Medicine | Admitting: Internal Medicine

## 2014-02-09 ENCOUNTER — Encounter (HOSPITAL_COMMUNITY): Payer: Self-pay | Admitting: Emergency Medicine

## 2014-02-09 DIAGNOSIS — I351 Nonrheumatic aortic (valve) insufficiency: Secondary | ICD-10-CM

## 2014-02-09 DIAGNOSIS — K2100 Gastro-esophageal reflux disease with esophagitis, without bleeding: Secondary | ICD-10-CM

## 2014-02-09 DIAGNOSIS — Z83719 Family history of colon polyps, unspecified: Secondary | ICD-10-CM

## 2014-02-09 DIAGNOSIS — J189 Pneumonia, unspecified organism: Secondary | ICD-10-CM

## 2014-02-09 DIAGNOSIS — Z9889 Other specified postprocedural states: Secondary | ICD-10-CM

## 2014-02-09 DIAGNOSIS — I482 Chronic atrial fibrillation, unspecified: Secondary | ICD-10-CM

## 2014-02-09 DIAGNOSIS — Z5181 Encounter for therapeutic drug level monitoring: Secondary | ICD-10-CM

## 2014-02-09 DIAGNOSIS — R251 Tremor, unspecified: Secondary | ICD-10-CM

## 2014-02-09 DIAGNOSIS — I4891 Unspecified atrial fibrillation: Secondary | ICD-10-CM

## 2014-02-09 DIAGNOSIS — J690 Pneumonitis due to inhalation of food and vomit: Secondary | ICD-10-CM

## 2014-02-09 DIAGNOSIS — Z8501 Personal history of malignant neoplasm of esophagus: Secondary | ICD-10-CM

## 2014-02-09 DIAGNOSIS — R2689 Other abnormalities of gait and mobility: Secondary | ICD-10-CM

## 2014-02-09 DIAGNOSIS — Z8249 Family history of ischemic heart disease and other diseases of the circulatory system: Secondary | ICD-10-CM

## 2014-02-09 DIAGNOSIS — Z825 Family history of asthma and other chronic lower respiratory diseases: Secondary | ICD-10-CM

## 2014-02-09 DIAGNOSIS — J181 Lobar pneumonia, unspecified organism: Secondary | ICD-10-CM

## 2014-02-09 DIAGNOSIS — I509 Heart failure, unspecified: Secondary | ICD-10-CM | POA: Diagnosis present

## 2014-02-09 DIAGNOSIS — I10 Essential (primary) hypertension: Secondary | ICD-10-CM

## 2014-02-09 DIAGNOSIS — M545 Low back pain, unspecified: Secondary | ICD-10-CM | POA: Diagnosis present

## 2014-02-09 DIAGNOSIS — K222 Esophageal obstruction: Secondary | ICD-10-CM

## 2014-02-09 DIAGNOSIS — I5032 Chronic diastolic (congestive) heart failure: Secondary | ICD-10-CM

## 2014-02-09 DIAGNOSIS — K219 Gastro-esophageal reflux disease without esophagitis: Secondary | ICD-10-CM

## 2014-02-09 DIAGNOSIS — Z800 Family history of malignant neoplasm of digestive organs: Secondary | ICD-10-CM

## 2014-02-09 DIAGNOSIS — Z8371 Family history of colonic polyps: Secondary | ICD-10-CM

## 2014-02-09 DIAGNOSIS — W010XXA Fall on same level from slipping, tripping and stumbling without subsequent striking against object, initial encounter: Secondary | ICD-10-CM | POA: Diagnosis present

## 2014-02-09 DIAGNOSIS — Z7901 Long term (current) use of anticoagulants: Secondary | ICD-10-CM

## 2014-02-09 DIAGNOSIS — K311 Adult hypertrophic pyloric stenosis: Secondary | ICD-10-CM

## 2014-02-09 DIAGNOSIS — J449 Chronic obstructive pulmonary disease, unspecified: Secondary | ICD-10-CM | POA: Diagnosis present

## 2014-02-09 DIAGNOSIS — G8929 Other chronic pain: Secondary | ICD-10-CM | POA: Diagnosis present

## 2014-02-09 DIAGNOSIS — Z8049 Family history of malignant neoplasm of other genital organs: Secondary | ICD-10-CM

## 2014-02-09 DIAGNOSIS — I272 Pulmonary hypertension, unspecified: Secondary | ICD-10-CM

## 2014-02-09 DIAGNOSIS — J45909 Unspecified asthma, uncomplicated: Secondary | ICD-10-CM

## 2014-02-09 DIAGNOSIS — Z888 Allergy status to other drugs, medicaments and biological substances status: Secondary | ICD-10-CM

## 2014-02-09 DIAGNOSIS — IMO0002 Reserved for concepts with insufficient information to code with codable children: Secondary | ICD-10-CM | POA: Diagnosis present

## 2014-02-09 DIAGNOSIS — R042 Hemoptysis: Secondary | ICD-10-CM

## 2014-02-09 DIAGNOSIS — Z79890 Hormone replacement therapy (postmenopausal): Secondary | ICD-10-CM

## 2014-02-09 DIAGNOSIS — S50312A Abrasion of left elbow, initial encounter: Secondary | ICD-10-CM

## 2014-02-09 DIAGNOSIS — R609 Edema, unspecified: Secondary | ICD-10-CM

## 2014-02-09 DIAGNOSIS — D62 Acute posthemorrhagic anemia: Secondary | ICD-10-CM

## 2014-02-09 DIAGNOSIS — E876 Hypokalemia: Secondary | ICD-10-CM

## 2014-02-09 DIAGNOSIS — Z881 Allergy status to other antibiotic agents status: Secondary | ICD-10-CM

## 2014-02-09 DIAGNOSIS — J4489 Other specified chronic obstructive pulmonary disease: Secondary | ICD-10-CM | POA: Diagnosis present

## 2014-02-09 DIAGNOSIS — D509 Iron deficiency anemia, unspecified: Secondary | ICD-10-CM

## 2014-02-09 DIAGNOSIS — M810 Age-related osteoporosis without current pathological fracture: Secondary | ICD-10-CM | POA: Diagnosis present

## 2014-02-09 DIAGNOSIS — R259 Unspecified abnormal involuntary movements: Secondary | ICD-10-CM | POA: Diagnosis present

## 2014-02-09 DIAGNOSIS — J9601 Acute respiratory failure with hypoxia: Secondary | ICD-10-CM

## 2014-02-09 DIAGNOSIS — Z9049 Acquired absence of other specified parts of digestive tract: Secondary | ICD-10-CM

## 2014-02-09 DIAGNOSIS — Y95 Nosocomial condition: Secondary | ICD-10-CM

## 2014-02-09 DIAGNOSIS — J9600 Acute respiratory failure, unspecified whether with hypoxia or hypercapnia: Secondary | ICD-10-CM | POA: Diagnosis present

## 2014-02-09 LAB — I-STAT CHEM 8, ED
BUN: 12 mg/dL (ref 6–23)
CALCIUM ION: 1.11 mmol/L — AB (ref 1.13–1.30)
CREATININE: 0.9 mg/dL (ref 0.50–1.10)
Chloride: 94 mEq/L — ABNORMAL LOW (ref 96–112)
GLUCOSE: 100 mg/dL — AB (ref 70–99)
HEMATOCRIT: 39 % (ref 36.0–46.0)
Hemoglobin: 13.3 g/dL (ref 12.0–15.0)
POTASSIUM: 3.1 meq/L — AB (ref 3.7–5.3)
Sodium: 135 mEq/L — ABNORMAL LOW (ref 137–147)
TCO2: 30 mmol/L (ref 0–100)

## 2014-02-09 LAB — URINALYSIS, ROUTINE W REFLEX MICROSCOPIC
Bilirubin Urine: NEGATIVE
GLUCOSE, UA: NEGATIVE mg/dL
Ketones, ur: NEGATIVE mg/dL
LEUKOCYTES UA: NEGATIVE
Nitrite: NEGATIVE
Protein, ur: NEGATIVE mg/dL
SPECIFIC GRAVITY, URINE: 1.008 (ref 1.005–1.030)
Urobilinogen, UA: 0.2 mg/dL (ref 0.0–1.0)
pH: 7.5 (ref 5.0–8.0)

## 2014-02-09 LAB — CBC
HCT: 36.6 % (ref 36.0–46.0)
HEMOGLOBIN: 11.7 g/dL — AB (ref 12.0–15.0)
MCH: 24.7 pg — ABNORMAL LOW (ref 26.0–34.0)
MCHC: 32 g/dL (ref 30.0–36.0)
MCV: 77.4 fL — AB (ref 78.0–100.0)
Platelets: 351 10*3/uL (ref 150–400)
RBC: 4.73 MIL/uL (ref 3.87–5.11)
RDW: 16.6 % — AB (ref 11.5–15.5)
WBC: 14.9 10*3/uL — AB (ref 4.0–10.5)

## 2014-02-09 LAB — URINE MICROSCOPIC-ADD ON

## 2014-02-09 LAB — I-STAT TROPONIN, ED: Troponin i, poc: 0.06 ng/mL (ref 0.00–0.08)

## 2014-02-09 MED ORDER — ADULT MULTIVITAMIN W/MINERALS CH
1.0000 | ORAL_TABLET | Freq: Every day | ORAL | Status: DC
  Administered 2014-02-10 – 2014-02-12 (×3): 1 via ORAL
  Filled 2014-02-09 (×3): qty 1

## 2014-02-09 MED ORDER — POTASSIUM CHLORIDE CRYS ER 20 MEQ PO TBCR
40.0000 meq | EXTENDED_RELEASE_TABLET | Freq: Every day | ORAL | Status: DC
  Filled 2014-02-09: qty 2

## 2014-02-09 MED ORDER — VANCOMYCIN HCL IN DEXTROSE 750-5 MG/150ML-% IV SOLN
750.0000 mg | INTRAVENOUS | Status: DC
  Filled 2014-02-09 (×2): qty 150

## 2014-02-09 MED ORDER — SODIUM CHLORIDE 0.9 % IJ SOLN
3.0000 mL | Freq: Two times a day (BID) | INTRAMUSCULAR | Status: DC
  Administered 2014-02-09 – 2014-02-11 (×4): 3 mL via INTRAVENOUS

## 2014-02-09 MED ORDER — SUCRALFATE 1 GM/10ML PO SUSP
1.0000 g | Freq: Two times a day (BID) | ORAL | Status: DC
  Administered 2014-02-09 – 2014-02-12 (×6): 1 g via ORAL
  Filled 2014-02-09 (×8): qty 10

## 2014-02-09 MED ORDER — ALBUTEROL SULFATE (2.5 MG/3ML) 0.083% IN NEBU: 2.5000 mg | INHALATION_SOLUTION | RESPIRATORY_TRACT | Status: DC | PRN

## 2014-02-09 MED ORDER — VITAMIN D (ERGOCALCIFEROL) 1.25 MG (50000 UNIT) PO CAPS: 50000.0000 [IU] | ORAL_CAPSULE | ORAL | Status: DC

## 2014-02-09 MED ORDER — POTASSIUM CHLORIDE CRYS ER 20 MEQ PO TBCR
40.0000 meq | EXTENDED_RELEASE_TABLET | Freq: Once | ORAL | Status: DC
  Filled 2014-02-09: qty 2

## 2014-02-09 MED ORDER — GUAIFENESIN-DM 100-10 MG/5ML PO SYRP: 5.0000 mL | ORAL_SOLUTION | ORAL | Status: DC | PRN

## 2014-02-09 MED ORDER — OLOPATADINE HCL 0.1 % OP SOLN
1.0000 [drp] | Freq: Two times a day (BID) | OPHTHALMIC | Status: DC
  Administered 2014-02-09 – 2014-02-12 (×6): 1 [drp] via OPHTHALMIC
  Filled 2014-02-09: qty 5

## 2014-02-09 MED ORDER — ACETAMINOPHEN 325 MG PO TABS: 650.0000 mg | ORAL_TABLET | Freq: Four times a day (QID) | ORAL | Status: DC | PRN

## 2014-02-09 MED ORDER — AMIODARONE HCL 200 MG PO TABS
200.0000 mg | ORAL_TABLET | Freq: Every day | ORAL | Status: DC
  Administered 2014-02-10 – 2014-02-12 (×3): 200 mg via ORAL
  Filled 2014-02-09 (×3): qty 1

## 2014-02-09 MED ORDER — ZOLPIDEM TARTRATE 5 MG PO TABS
5.0000 mg | ORAL_TABLET | Freq: Every day | ORAL | Status: DC
  Administered 2014-02-09 – 2014-02-11 (×3): 5 mg via ORAL
  Filled 2014-02-09 (×3): qty 1

## 2014-02-09 MED ORDER — BUDESONIDE 0.25 MG/2ML IN SUSP
0.2500 mg | Freq: Two times a day (BID) | RESPIRATORY_TRACT | Status: DC
  Administered 2014-02-10 – 2014-02-12 (×5): 0.25 mg via RESPIRATORY_TRACT
  Filled 2014-02-09 (×7): qty 2

## 2014-02-09 MED ORDER — HEPARIN SODIUM (PORCINE) 5000 UNIT/ML IJ SOLN
5000.0000 [IU] | Freq: Three times a day (TID) | INTRAMUSCULAR | Status: DC
  Administered 2014-02-09 – 2014-02-12 (×9): 5000 [IU] via SUBCUTANEOUS
  Filled 2014-02-09 (×10): qty 1

## 2014-02-09 MED ORDER — ONDANSETRON HCL 4 MG/2ML IJ SOLN: 4.0000 mg | Freq: Four times a day (QID) | INTRAMUSCULAR | Status: DC | PRN

## 2014-02-09 MED ORDER — ONDANSETRON HCL 4 MG PO TABS: 4.0000 mg | ORAL_TABLET | Freq: Four times a day (QID) | ORAL | Status: DC | PRN

## 2014-02-09 MED ORDER — ONDANSETRON HCL 4 MG/2ML IJ SOLN
4.0000 mg | Freq: Once | INTRAMUSCULAR | Status: AC
  Administered 2014-02-09: 4 mg via INTRAVENOUS
  Filled 2014-02-09: qty 2

## 2014-02-09 MED ORDER — FUROSEMIDE 10 MG/ML IJ SOLN
40.0000 mg | Freq: Every day | INTRAMUSCULAR | Status: DC
  Administered 2014-02-10: 40 mg via INTRAVENOUS
  Filled 2014-02-09 (×2): qty 4

## 2014-02-09 MED ORDER — VIACTIV MULTI-VITAMIN PO CHEW: 1.0000 | CHEWABLE_TABLET | Freq: Every day | ORAL | Status: DC

## 2014-02-09 MED ORDER — TRAMADOL HCL 50 MG PO TABS
50.0000 mg | ORAL_TABLET | Freq: Three times a day (TID) | ORAL | Status: DC | PRN
  Administered 2014-02-10 – 2014-02-12 (×3): 50 mg via ORAL
  Filled 2014-02-09 (×4): qty 1

## 2014-02-09 MED ORDER — PIPERACILLIN-TAZOBACTAM IN DEX 2-0.25 GM/50ML IV SOLN
2.2500 g | Freq: Once | INTRAVENOUS | Status: AC
  Administered 2014-02-09: 2.25 g via INTRAVENOUS
  Filled 2014-02-09: qty 50

## 2014-02-09 MED ORDER — HYDROCOD POLST-CHLORPHEN POLST 10-8 MG/5ML PO LQCR
2.5000 mL | Freq: Two times a day (BID) | ORAL | Status: DC | PRN
  Administered 2014-02-10 – 2014-02-11 (×2): 2.5 mL via ORAL
  Filled 2014-02-09 (×3): qty 5

## 2014-02-09 MED ORDER — PANTOPRAZOLE SODIUM 40 MG PO TBEC
40.0000 mg | DELAYED_RELEASE_TABLET | Freq: Two times a day (BID) | ORAL | Status: DC
  Administered 2014-02-09 – 2014-02-12 (×6): 40 mg via ORAL
  Filled 2014-02-09 (×7): qty 1

## 2014-02-09 MED ORDER — IPRATROPIUM-ALBUTEROL 0.5-2.5 (3) MG/3ML IN SOLN
3.0000 mL | Freq: Four times a day (QID) | RESPIRATORY_TRACT | Status: DC
  Administered 2014-02-09 – 2014-02-11 (×10): 3 mL via RESPIRATORY_TRACT
  Filled 2014-02-09 (×10): qty 3

## 2014-02-09 MED ORDER — ACETAMINOPHEN 650 MG RE SUPP: 650.0000 mg | Freq: Four times a day (QID) | RECTAL | Status: DC | PRN

## 2014-02-09 MED ORDER — ALBUTEROL SULFATE (2.5 MG/3ML) 0.083% IN NEBU
2.5000 mg | INHALATION_SOLUTION | RESPIRATORY_TRACT | Status: DC | PRN
  Filled 2014-02-09: qty 3

## 2014-02-09 MED ORDER — VANCOMYCIN HCL IN DEXTROSE 1-5 GM/200ML-% IV SOLN
1000.0000 mg | Freq: Once | INTRAVENOUS | Status: AC
  Administered 2014-02-09: 1000 mg via INTRAVENOUS
  Filled 2014-02-09: qty 200

## 2014-02-09 MED ORDER — PIPERACILLIN-TAZOBACTAM 3.375 G IVPB
3.3750 g | Freq: Three times a day (TID) | INTRAVENOUS | Status: DC
  Administered 2014-02-09 – 2014-02-10 (×2): 3.375 g via INTRAVENOUS
  Filled 2014-02-09 (×5): qty 50

## 2014-02-09 MED ORDER — METOCLOPRAMIDE HCL 5 MG PO TABS
5.0000 mg | ORAL_TABLET | Freq: Two times a day (BID) | ORAL | Status: DC
  Administered 2014-02-09 – 2014-02-12 (×6): 5 mg via ORAL
  Filled 2014-02-09 (×7): qty 1

## 2014-02-09 MED ORDER — SODIUM CHLORIDE 0.9 % IV SOLN: 250.0000 mL | INTRAVENOUS | Status: DC | PRN

## 2014-02-09 MED ORDER — SODIUM CHLORIDE 0.9 % IJ SOLN: 3.0000 mL | INTRAMUSCULAR | Status: DC | PRN

## 2014-02-09 NOTE — ED Notes (Signed)
Patient transported to X-ray 

## 2014-02-09 NOTE — ED Notes (Signed)
Patient reports needs yogurt to take pills with; will attempt to find yogurt for potassium admin. Holding antibiotic admin for blood culture draw.

## 2014-02-09 NOTE — ED Notes (Signed)
Report attempted 

## 2014-02-09 NOTE — ED Notes (Signed)
Sent from [facility] to be evaluated for elevated WBC 23.5; in treatment for pneumonia for last 6 months, finished most recent antibiotics yesterday. Denies SOB, reports a sharp pain with taking deep breaths (2/10). Reports fever yesterday, none today.

## 2014-02-09 NOTE — ED Provider Notes (Signed)
CSN: [id number]     Arrival date & time 02/09/14  1150 History   First MD Initiated Contact with Patient 02/09/14 1151     Chief Complaint  Patient presents with  . Abnormal Lab     (Consider location/radiation/quality/duration/timing/severity/associated sxs/prior Treatment) HPI Comments: Pt states that she was sent in because she had a fever or 102 yesterday and was not feeling well and her labs showed and elevated wbc count of 23.5. Pt states that she hasn't had fever today but she is having left intermittent chest pain. State that she is having some nausea today. She has been having pneumonia intermittently over the last 6 months.  The history is provided by the patient. No language interpreter was used.    Past Medical History  Diagnosis Date  . GERD (gastroesophageal reflux disease)   . Osteoporosis   . Postmenopausal HRT (hormone replacement therapy)   . Anemia   . Allergy   . Asthma   . Shortness of breath   . PONV (postoperative nausea and vomiting)   . Dysrhythmia     a fib  . Chronic bronchitis     "practically q winter" (01/22/2014)  . History of blood transfusion 1993    "related to esophagus removal"  . Arthritis     "legs" (01/06/2014)  . Pneumonia     "all the time; probably 5 times in the last year, counting today" (01/06/2014)  . Aspiration pneumonia 11/22/2013  . H/O hiatal hernia   . Headache(784.0)     "monthly in the past year" (01/22/2014)  . Chronic lower back pain     "maybe cause I fell in the tub on 01/05/2014"  . Urinary frequency   . Esophageal cancer     removed esophagus stomach pulled up; "encapsulated; didn't have to have radiation"  . Basal cell carcinoma of face     "several; freezes them off; face"   Past Surgical History  Procedure Laterality Date  . Left oophorectomy  1952  . Esophageal removal of cancer,gastric ppull-thru 1993    . Forearm fracture surgery Right ?1990's  . Esophagogastroduodenoscopy  10/19/2011    Procedure:  ESOPHAGOGASTRODUODENOSCOPY (EGD);  Surgeon: Milus Banister, MD;  Location: [facility];  Service: Endoscopy;  Laterality: N/A;  . Balloon dilation  10/19/2011    Procedure: BALLOON DILATION;  Surgeon: Milus Banister, MD;  Location: [facility] ENDOSCOPY;  Service: Endoscopy;  Laterality: N/A;  . Esophagogastroduodenoscopy  11/07/2011    Procedure: ESOPHAGOGASTRODUODENOSCOPY (EGD);  Surgeon: Ladene Artist, MD,FACG;  Location: [facility] END[facility];  Service: Endoscopy;  Laterality: N/A;  . Esophagogastroduodenoscopy  11/22/2011    Procedure: ESOPHAGOGASTRODUODENOSCOPY (EGD);  Surgeon: Inda Castle, MD;  Location: [facility];  Service: Endoscopy;  Laterality: N/A;  . Esophagogastroduodenoscopy  11/24/2011    Procedure: ESOPHAGOGASTRODUODENOSCOPY (EGD);  Surgeon: Inda Castle, MD;  Location: [facility];  Service: Endoscopy;  Laterality: N/A;  with removable duodenal stent (actually using esophageal partially covered 23X15 located in locked supply room  . Duodenal stent placement  11/24/2011    Procedure: DUODENAL STENT PLACEMENT;  Surgeon: Inda Castle, MD;  Location: [facility] ENDOSCOPY;  Service: Endoscopy;  Laterality: N/A;  . Video bronchoscopy Bilateral 01/18/2013    Procedure: VIDEO BRONCHOSCOPY WITHOUT FLUORO;  Surgeon: Tanda Rockers, MD;  Location: [facility] ENDOSCOPY;  Service: Cardiopulmonary;  Laterality: Bilateral;  . Esophagogastroduodenoscopy N/A 02/11/2013    Procedure: ESOPHAGOGASTRODUODENOSCOPY (EGD);  Surgeon: Inda Castle, MD;  Location: [facility];  Service: Endoscopy;  Laterality: N/A;  .  Duodenal stent placement N/A 02/11/2013    Procedure: DUODENAL STENT PLACEMENT;  Surgeon: Inda Castle, MD;  Location: [facility]OS[facility];  Service: Endoscopy;  Laterality: N/A;  . Esophagogastroduodenoscopy N/A 09/11/2013    Procedure: ESOPHAGOGASTRODUODENOSCOPY (EGD);  Surgeon: Gatha Mayer, MD;  Location: [facility];  Service: Endoscopy;  Laterality: N/A;  . Cholecystectomy  ~ 1996  . Dilation and curettage  of uterus  X 6-7  . Cataract extraction w/ intraocular lens  implant, bilateral Bilateral ~ 2000  . Appendectomy     Family History  Problem Relation Age of Onset  . Cervical cancer Mother   . Heart disease Father     MI  . Coronary artery disease Brother   . Hypotension Sister   . Colon cancer Neg Hx   . Colon polyps Sister   . Pancreatic cancer      1/2 sister  . Coronary artery disease Sister     pacemaker  . Asthma Brother   . Asthma Sister    History  Substance Use Topics  . Smoking status: Never Smoker   . Smokeless tobacco: Never Used  . Alcohol Use: No   OB History   Grav Para Term Preterm Abortions TAB SAB Ect Mult Living                 Review of Systems  Constitutional: Negative.   Respiratory: Negative.   Cardiovascular: Negative.       Allergies  Ensure; Erythromycin ethylsuccinate; Prednisone; and Doxycycline  Home Medications   Prior to Admission medications   Medication Sig Start Date End Date Taking? Authorizing Provider  acetaminophen (TYLENOL) 500 MG tablet Take 1 tablet (500 mg total) by mouth every 6 (six) hours as needed for headache. 12/02/13   Tammy S Parrett, NP  albuterol (PROVENTIL HFA;VENTOLIN HFA) 108 (90 BASE) MCG/ACT inhaler Inhale 2 puffs into the lungs every 6 (six) hours as needed. For wheezing 12/02/13   Tammy S Parrett, NP  albuterol (PROVENTIL) (2.5 MG/3ML) 0.083% nebulizer solution Take 3 mLs (2.5 mg total) by nebulization every 4 (four) hours as needed for wheezing or shortness of breath. 12/10/13   Tanda Rockers, MD  amiodarone (PACERONE) 200 MG tablet Take 200 mg by mouth daily.    Historical Provider, MD  amoxicillin-clavulanate (AUGMENTIN) 400-57 MG/5ML suspension Take 10.9 mLs (872 mg total) by mouth 2 (two) times daily before a meal. Breakfast and Evening meal (Supper) 01/27/14 02/09/14  Bobby Rumpf York, PA-C  arformoterol (BROVANA) 15 MCG/2ML NEBU Take 2 mLs (15 mcg total) by nebulization 2 (two) times daily. Dx: 493.00 12/02/13    Tammy S Parrett, NP  budesonide (PULMICORT) 0.25 MG/2ML nebulizer solution Take 2 mLs (0.25 mg total) by nebulization 2 (two) times daily. Dx: 493.00 12/02/13   Melvenia Needles, NP  ergocalciferol (VITAMIN D2) 50000 UNITS capsule Take 50,000 Units by mouth every Friday.    Historical Provider, MD  Fe Fum-FA-B Cmp-C-Zn-Mg-Mn-Cu (HEMOCYTE PLUS) 106-1 MG CAPS Take 1 tablet by mouth 2 (two) times daily. 12/02/13   Tammy S Parrett, NP  furosemide (LASIX) 40 MG tablet Take 1 tablet (40 mg total) by mouth daily. 10/11/13   Timoteo Gaul, FNP  GuaiFENesin (MUCINEX CHILDRENS PO) Take 0.5 tablets by mouth 2 (two) times daily.    Historical Provider, MD  Methylcobalamin (B-12) 5000 MCG TBDP Take 1 tablet by mouth daily.    Historical Provider, MD  Multiple Vitamins-Calcium (VIACTIV MULTI-VITAMIN) CHEW Chew 1 each by mouth daily. 12/02/13  Tammy S Parrett, NP  Olopatadine HCl (PATADAY) 0.2 % SOLN Apply 1-2 drops to eye 2 (two) times daily as needed (dry, itchy eyes.).    Historical Provider, MD  pantoprazole (PROTONIX) 40 MG tablet Take 1 tablet (40 mg total) by mouth 2 (two) times daily. Give once before breakfast and once before bed time. 01/27/14   Bobby Rumpf York, PA-C  promethazine (PHENERGAN) 25 MG suppository Place 25 mg rectally every 6 (six) hours as needed for nausea or vomiting.    Historical Provider, MD  sucralfate (CARAFATE) 1 GM/10ML suspension Take 10 mLs (1 g total) by mouth 2 (two) times daily before a meal. 01/09/14   Delfina Redwood, MD  traMADol (ULTRAM) 50 MG tablet Take 1 tablet (50 mg total) by mouth every 8 (eight) hours as needed for moderate pain. 01/27/14   Bobby Rumpf York, PA-C  zolpidem (AMBIEN) 5 MG tablet Take 5 mg by mouth at bedtime.    Historical Provider, MD   There were no vitals taken for this visit. Physical Exam  Nursing note and vitals reviewed. Constitutional: She is oriented to person, place, and time. She appears well-developed and well-nourished.  Cardiovascular:  Normal rate and regular rhythm.   Pulmonary/Chest: Effort normal. She has wheezes. She has rales.  Left sided chest tenderness  Abdominal: Soft. Bowel sounds are normal.  Musculoskeletal: Normal range of motion.  Neurological: She is alert and oriented to person, place, and time. She exhibits normal muscle tone. Coordination normal.  Skin:  Abrasion to the left elbow  Psychiatric: She has a normal mood and affect.    ED Course  Procedures (including critical care time) Labs Review Labs Reviewed  CBC - Abnormal; Notable for the following:    WBC 14.9 (*)    Hemoglobin 11.7 (*)    MCV 77.4 (*)    MCH 24.7 (*)    RDW 16.6 (*)    All other components within normal limits  URINALYSIS, ROUTINE W REFLEX MICROSCOPIC - Abnormal; Notable for the following:    Hgb urine dipstick TRACE (*)    All other components within normal limits  I-STAT CHEM 8, ED - Abnormal; Notable for the following:    Sodium 135 (*)    Potassium 3.1 (*)    Chloride 94 (*)    Glucose, Bld 100 (*)    Calcium, Ion 1.11 (*)    All other components within normal limits  CULTURE, BLOOD (ROUTINE X 2)  CULTURE, BLOOD (ROUTINE X 2)  URINE MICROSCOPIC-ADD ON  I-STAT TROPOININ, ED    Imaging Review Dg Chest 2 View  02/09/2014   CLINICAL DATA:  Chronic cough for 3 months pneumonia, chest pain due to cough, weakness, history asthma, esophageal cancer  EXAM: CHEST  2 VIEW  COMPARISON:  01/22/2014  FINDINGS: Enlargement of cardiac silhouette.  Calcified tortuous aorta.  Pulmonary vascular congestion.  Emphysematous and bronchitic changes consistent with COPD.  Chronic atelectasis versus infiltrate LEFT lower lobe.  Remaining lungs grossly clear.  No pleural effusion or pneumothorax.  Diffuse osseous demineralization.  Post esophageal resection and gastric pull-up with duodenal stent placement.  IMPRESSION: Enlargement of cardiac silhouette with pulmonary vascular congestion.  COPD changes with chronic atelectasis versus  infiltrate LEFT lower lobe.  Gastric pull-up with stable appearance of duodenal stent.  No acute abnormalities.   Electronically Signed   By: Lavonia Dana M.D.   On: 02/09/2014 12:57     EKG Interpretation None      MDM  Final diagnoses:  HAP (hospital-acquired pneumonia)  Hypokalemia  Elbow abrasion, left, initial encounter    Will admit for possible pneumonia as pt oxygen dropped and pt recently stopped augmentin. Pt to be admitted to the hospitalist.     Glendell Docker, NP 02/09/14 1359

## 2014-02-09 NOTE — ED Notes (Signed)
Patient returned from X-ray 

## 2014-02-09 NOTE — Progress Notes (Signed)
ANTIBIOTIC CONSULT NOTE - INITIAL  Pharmacy Consult for Vancomycin and Zosyn Indication: pneumonia  Allergies  Allergen Reactions  . Ensure [Nutritional Supplements]     "gives me diarrhea"  . Erythromycin Ethylsuccinate     Irregular pulse rate  . Prednisone     "makes me hyper"  . Doxycycline Rash    Patient Measurements: Height: 5' (152.4 cm) Weight: 108 lb 3.9 oz (49.1 kg) (scale C) IBW/kg (Calculated) : 45.5  Vital Signs: Temp: 98.6 F (37 C) (07/12 1550) Temp src: Oral (07/12 1550) BP: 99/72 mmHg (07/12 1550) Pulse Rate: 91 (07/12 1550) Intake/Output from previous day:   Intake/Output from this shift:    Labs:  Recent Labs  02/09/14 1250 02/09/14 1317  WBC 14.9*  --   HGB 11.7* 13.3  PLT 351  --   CREATININE  --  0.90   Estimated Creatinine Clearance: 34.6 ml/min (by C-G formula based on Cr of 0.9). No results found for this basename: VANCOTROUGH, Corlis Leak, VANCORANDOM, [place], GENTPEAK, [place], [place], TOBRAPEAK, TOBRARND, AMIKACINPEAK, AMIKACINTROU, AMIKACIN,  in the last 72 hours   Microbiology: Recent Results (from the past 720 hour(s))  URINE CULTURE     Status: None   Collection Time    01/14/14 10:41 PM      Result Value Ref Range Status   Specimen Description URINE, RANDOM   Final   Special Requests NONE   Final   Culture  Setup Time     Final   Value: 01/15/2014 08:41     Performed at [facility]     Final   Value: 50,000 COLONIES/ML     Performed at [facility]   Culture     Final   Value: VANCOMYCIN RESISTANT ENTEROCOCCUS ISOLATED     Note: CRITICAL RESULT CALLED TO, READ BACK BY AND VERIFIED WITH: CATHY HAROLD @ [place] @ 8:29AM 01/22/14 BY COOKV     Performed at [facility]   Report Status 01/22/2014 FINAL   Final   Organism ID, Bacteria VANCOMYCIN RESISTANT ENTEROCOCCUS ISOLATED   Final    Medical History: Past Medical History  Diagnosis Date  . GERD  (gastroesophageal reflux disease)   . Osteoporosis   . Postmenopausal HRT (hormone replacement therapy)   . Anemia   . Allergy   . Asthma   . Shortness of breath   . PONV (postoperative nausea and vomiting)   . Dysrhythmia     a fib  . Chronic bronchitis     "practically q winter" (01/22/2014)  . History of blood transfusion 1993    "related to esophagus removal"  . Arthritis     "legs" (01/06/2014)  . Pneumonia     "all the time; probably 5 times in the last year, counting today" (01/06/2014)  . Aspiration pneumonia 11/22/2013  . H/O hiatal hernia   . Headache(784.0)     "monthly in the past year" (01/22/2014)  . Chronic lower back pain     "maybe cause I fell in the tub on 01/05/2014"  . Urinary frequency   . Esophageal cancer     removed esophagus stomach pulled up; "encapsulated; didn't have to have radiation"  . Basal cell carcinoma of face     "several; freezes them off; face"    Medications:  Prescriptions prior to admission  Medication Sig Dispense Refill  . acetaminophen (TYLENOL) 500 MG tablet Take 1 tablet (500 mg total) by mouth every 6 (six) hours as  needed for headache.  30 tablet    . albuterol (PROVENTIL HFA;VENTOLIN HFA) 108 (90 BASE) MCG/ACT inhaler Inhale 2 puffs into the lungs every 6 (six) hours as needed. For wheezing  1 Inhaler  4  . albuterol (PROVENTIL) (2.5 MG/3ML) 0.083% nebulizer solution Take 3 mLs (2.5 mg total) by nebulization every 4 (four) hours as needed for wheezing or shortness of breath.  75 mL  12  . amiodarone (PACERONE) 200 MG tablet Take 200 mg by mouth daily.      . budesonide (PULMICORT) 0.25 MG/2ML nebulizer solution Take 2 mLs (0.25 mg total) by nebulization 2 (two) times daily. Dx: 493.00  60 mL    . chlorpheniramine-HYDROcodone (TUSSIONEX) 10-8 MG/5ML LQCR Take 2.5 mLs by mouth 2 (two) times daily as needed for cough.      . ergocalciferol (VITAMIN D2) 50000 UNITS capsule Take 50,000 Units by mouth every Friday.      . Fe Fum-FA-B  Cmp-C-Zn-Mg-Mn-Cu (HEMOCYTE PLUS PO) Take 1 capsule by mouth 2 (two) times daily.      . furosemide (LASIX) 40 MG tablet Take 1 tablet (40 mg total) by mouth daily.  30 tablet  0  . ipratropium-albuterol (DUONEB) 0.5-2.5 (3) MG/3ML SOLN Take 3 mLs by nebulization 3 (three) times daily.      . metoCLOPramide (REGLAN) 5 MG tablet Take 5 mg by mouth 2 (two) times daily. 1 hour before breakfast and supper      . Multiple Vitamins-Calcium (VIACTIV MULTI-VITAMIN) CHEW Chew 1 each by mouth daily.    0  . Olopatadine HCl (PATADAY) 0.2 % SOLN Apply 1-2 drops to eye 2 (two) times daily as needed (dry, itchy eyes.).      Marland Kitchen pantoprazole (PROTONIX) 40 MG tablet Take 1 tablet (40 mg total) by mouth 2 (two) times daily. Give once before breakfast and once before bed time.      . promethazine (PHENERGAN) 25 MG suppository Place 25 mg rectally every 6 (six) hours as needed for nausea or vomiting.      . sucralfate (CARAFATE) 1 GM/10ML suspension Take 10 mLs (1 g total) by mouth 2 (two) times daily before a meal.  420 mL  0  . traMADol (ULTRAM) 50 MG tablet Take 1 tablet (50 mg total) by mouth every 8 (eight) hours as needed for moderate pain.  30 tablet  0  . zolpidem (AMBIEN) 5 MG tablet Take 5 mg by mouth at bedtime.      Marland Kitchen amoxicillin-clavulanate (AUGMENTIN) 400-57 MG/5ML suspension Take 10.9 mLs (872 mg total) by mouth 2 (two) times daily before a meal. Breakfast and Evening meal (Supper)  200 mL  0   Assessment: 80 yo F admitted 02/09/2014 with cough, fever and general weakness.   Pharmacy consulted to dose vancomycin and zosyn for HCAP.    PMH: esophageal cancer, esophagectomy with gastric pull up now with chronic reflux/aspiration, afib, diastolic HF, chronic tremor  ID: HCAP, afeb, WBC 14.9  ABX: Vanc 7/12>> Zosyn 7/12>>  Goal of Therapy:  Vancomycin trough level 10-15 mcg/ml  Plan:  1. Zosyn 3.375g IV q8h infuse over 4h 2. Vancomycin 750 mg IV q24h 3. Follow up SCr, UOP, cultures, clinical  course and adjust as clinically indicated.  Thank you for allowing pharmacy to be a part of this patients care team.  Rowe Robert Pharm.D., BCPS, AQ-Cardiology Clinical Pharmacist 02/09/2014 4:09 PM Pager: [phone] Phone: [phone]

## 2014-02-09 NOTE — ED Provider Notes (Signed)
Medical screening examination/treatment/procedure(s) were conducted as a shared visit with non-physician practitioner(s) and myself.  I personally evaluated the patient during the encounter. Patient is an 80 year old female sent for evaluation from nursing home for evaluation of fever to 102 yesterday an elevated white count of 23.5. She was recently diagnosed with pneumonia and treated with Augmentin. She continues to cough. Her cough is productive of a green sputum.  On exam, vitals are stable and the patient is afebrile. Head is atraumatic, normocephalic. Neck is supple. Heart is regular rate and rhythm without murmurs. Lungs are rhonchorous bilaterally. Abdomen is soft, nontender. Extremities are without edema.  Workup revealed an elevated white count of 15,000 and chest x-ray shows findings in the bases consistent with infiltrate versus atelectasis. Due to the fever, persistent white count, and persistent productive cough, it appears as if she is failing outpatient treatment. She did have a hypoxic episode requiring oxygen. We'll consult internal medicine the patient will be admitted to the hospitalist service.   EKG Interpretation   Date/Time:  Sunday February 09 2014 12:10:57 EDT Ventricular Rate:  90 PR Interval:  163 QRS Duration: 78 QT Interval:  438 QTC Calculation: 536 R Axis:   14 Text Interpretation:  Sinus rhythm Consider anterior infarct Nonspecific  repol abnormality, lateral leads Prolonged QT interval Confirmed by Beau Fanny   MD, Tracee Mccreery ([id number]) on 02/09/2014 1:02:59 PM       Veryl Speak, MD 02/09/14 1427

## 2014-02-09 NOTE — H&P (Signed)
PATIENT DETAILS Name: [name] Age: 80 y.o. Sex: female Date of Birth: April 23, 1929 Admit Date: 02/09/2014 GGY:IRSWNIOE, Erica BOYD, FNP  CHIEF COMPLAINT:  Cough, fever, gen weakness-2 days  HPI: [name] is a 80 y.o. female with a Past Medical History of esophageal cancer s/p esophagogastrectomy and gastric pullup in 1993,history of anastomotic stenosis requiring dilation in March 2013,hx of Gastric outlet obstruction requiring duodennal stent placement in April 2013 and July 2014,she underwent EGD for recurrent n/v with findings of a mild GEJ stricture and pyloric stenosis.She has had recurrent admissions for aspiration PNA, infact was discharged from this facility on 01/27/14. She apparently just completed a course of antibiotics a few days back. Approximately, 2 days back, patient started developing productive cough with yellow sputum, generalized weakness and subjective fever. She claims that her symptoms worsened dramatically yesterday, and was noted to have a fever of 102 F. She apparently had a CBC done yesterday which showed a leukocytosis of 23 thousand. She was then referred to the ED for further evaluation and treatment. Patient denies headache, SOB, chest pain, nausea, vomiting, diarrhea or abdominal pain.    ALLERGIES:   Allergies  Allergen Reactions  . Ensure [Nutritional Supplements]     "gives me diarrhea"  . Erythromycin Ethylsuccinate     Irregular pulse rate  . Prednisone     "makes me hyper"  . Doxycycline Rash    PAST MEDICAL HISTORY: Past Medical History  Diagnosis Date  . GERD (gastroesophageal reflux disease)   . Osteoporosis   . Postmenopausal HRT (hormone replacement therapy)   . Anemia   . Allergy   . Asthma   . Shortness of breath   . PONV (postoperative nausea and vomiting)   . Dysrhythmia     a fib  . Chronic bronchitis     "practically q winter" (01/22/2014)  . History of blood transfusion 1993    "related to  esophagus removal"  . Arthritis     "legs" (01/06/2014)  . Pneumonia     "all the time; probably 5 times in the last year, counting today" (01/06/2014)  . Aspiration pneumonia 11/22/2013  . H/O hiatal hernia   . Headache(784.0)     "monthly in the past year" (01/22/2014)  . Chronic lower back pain     "maybe cause I fell in the tub on 01/05/2014"  . Urinary frequency   . Esophageal cancer     removed esophagus stomach pulled up; "encapsulated; didn't have to have radiation"  . Basal cell carcinoma of face     "several; freezes them off; face"    PAST SURGICAL HISTORY: Past Surgical History  Procedure Laterality Date  . Left oophorectomy  1952  . Esophageal removal of cancer,gastric ppull-thru 1993    . Forearm fracture surgery Right ?1990's  . Esophagogastroduodenoscopy  10/19/2011    Procedure: ESOPHAGOGASTRODUODENOSCOPY (EGD);  Surgeon: Milus Banister, MD;  Location: [facility];  Service: Endoscopy;  Laterality: N/A;  . Balloon dilation  10/19/2011    Procedure: BALLOON DILATION;  Surgeon: Milus Banister, MD;  Location: [facility];  Service: Endoscopy;  Laterality: N/A;  . Esophagogastroduodenoscopy  11/07/2011    Procedure: ESOPHAGOGASTRODUODENOSCOPY (EGD);  Surgeon: Ladene Artist, MD,FACG;  Location: [facility] ENDOSCOPY;  Service: Endoscopy;  Laterality: N/A;  . Esophagogastroduodenoscopy  11/22/2011    Procedure: ESOPHAGOGASTRODUODENOSCOPY (EGD);  Surgeon: Inda Castle, MD;  Location: [facility];  Service: Endoscopy;  Laterality: N/A;  . Esophagogastroduodenoscopy  11/24/2011  Procedure: ESOPHAGOGASTRODUODENOSCOPY (EGD);  Surgeon: Inda Castle, MD;  Location: [facility];  Service: Endoscopy;  Laterality: N/A;  with removable duodenal stent (actually using esophageal partially covered 23X15 located in locked supply room  . Duodenal stent placement  11/24/2011    Procedure: DUODENAL STENT PLACEMENT;  Surgeon: Inda Castle, MD;  Location: [facility];  Service: Endoscopy;   Laterality: N/A;  . Video bronchoscopy Bilateral 01/18/2013    Procedure: VIDEO BRONCHOSCOPY WITHOUT FLUORO;  Surgeon: Tanda Rockers, MD;  Location: [facility] ENDOSCOPY;  Service: Cardiopulmonary;  Laterality: Bilateral;  . Esophagogastroduodenoscopy N/A 02/11/2013    Procedure: ESOPHAGOGASTRODUODENOSCOPY (EGD);  Surgeon: Inda Castle, MD;  Location: [facility];  Service: Endoscopy;  Laterality: N/A;  . Duodenal stent placement N/A 02/11/2013    Procedure: DUODENAL STENT PLACEMENT;  Surgeon: Inda Castle, MD;  Location: [facility];  Service: Endoscopy;  Laterality: N/A;  . Esophagogastroduodenoscopy N/A 09/11/2013    Procedure: ESOPHAGOGASTRODUODENOSCOPY (EGD);  Surgeon: Gatha Mayer, MD;  Location: [facility];  Service: Endoscopy;  Laterality: N/A;  . Cholecystectomy  ~ 1996  . Dilation and curettage of uterus  X 6-7  . Cataract extraction w/ intraocular lens  implant, bilateral Bilateral ~ 2000  . Appendectomy      MEDICATIONS AT HOME: Prior to Admission medications   Medication Sig Start Date End Date Taking? Authorizing Provider  acetaminophen (TYLENOL) 500 MG tablet Take 1 tablet (500 mg total) by mouth every 6 (six) hours as needed for headache. 12/02/13  Yes Tammy S Parrett, NP  albuterol (PROVENTIL HFA;VENTOLIN HFA) 108 (90 BASE) MCG/ACT inhaler Inhale 2 puffs into the lungs every 6 (six) hours as needed. For wheezing 12/02/13  Yes Tammy S Parrett, NP  albuterol (PROVENTIL) (2.5 MG/3ML) 0.083% nebulizer solution Take 3 mLs (2.5 mg total) by nebulization every 4 (four) hours as needed for wheezing or shortness of breath. 12/10/13  Yes Tanda Rockers, MD  amiodarone (PACERONE) 200 MG tablet Take 200 mg by mouth daily.   Yes Historical Provider, MD  budesonide (PULMICORT) 0.25 MG/2ML nebulizer solution Take 2 mLs (0.25 mg total) by nebulization 2 (two) times daily. Dx: 493.00 12/02/13  Yes Tammy S Parrett, NP  chlorpheniramine-HYDROcodone (TUSSIONEX) 10-8 MG/5ML LQCR Take 2.5 mLs by mouth 2  (two) times daily as needed for cough.   Yes Historical Provider, MD  ergocalciferol (VITAMIN D2) 50000 UNITS capsule Take 50,000 Units by mouth every Friday.   Yes Historical Provider, MD  Fe Fum-FA-B Cmp-C-Zn-Mg-Mn-Cu (HEMOCYTE PLUS PO) Take 1 capsule by mouth 2 (two) times daily.   Yes Historical Provider, MD  furosemide (LASIX) 40 MG tablet Take 1 tablet (40 mg total) by mouth daily. 10/11/13  Yes Timoteo Gaul, FNP  ipratropium-albuterol (DUONEB) 0.5-2.5 (3) MG/3ML SOLN Take 3 mLs by nebulization 3 (three) times daily.   Yes Historical Provider, MD  metoCLOPramide (REGLAN) 5 MG tablet Take 5 mg by mouth 2 (two) times daily. 1 hour before breakfast and supper   Yes Historical Provider, MD  Multiple Vitamins-Calcium (VIACTIV MULTI-VITAMIN) CHEW Chew 1 each by mouth daily. 12/02/13  Yes Tammy S Parrett, NP  Olopatadine HCl (PATADAY) 0.2 % SOLN Apply 1-2 drops to eye 2 (two) times daily as needed (dry, itchy eyes.).   Yes Historical Provider, MD  pantoprazole (PROTONIX) 40 MG tablet Take 1 tablet (40 mg total) by mouth 2 (two) times daily. Give once before breakfast and once before bed time. 01/27/14  Yes Marianne L York, PA-C  promethazine (PHENERGAN) 25 MG  suppository Place 25 mg rectally every 6 (six) hours as needed for nausea or vomiting.   Yes Historical Provider, MD  sucralfate (CARAFATE) 1 GM/10ML suspension Take 10 mLs (1 g total) by mouth 2 (two) times daily before a meal. 01/09/14  Yes Delfina Redwood, MD  traMADol (ULTRAM) 50 MG tablet Take 1 tablet (50 mg total) by mouth every 8 (eight) hours as needed for moderate pain. 01/27/14  Yes Marianne L York, PA-C  zolpidem (AMBIEN) 5 MG tablet Take 5 mg by mouth at bedtime.   Yes Historical Provider, MD  amoxicillin-clavulanate (AUGMENTIN) 400-57 MG/5ML suspension Take 10.9 mLs (872 mg total) by mouth 2 (two) times daily before a meal. Breakfast and Evening meal (Supper) 01/27/14 02/09/14  Melton Alar, PA-C    FAMILY HISTORY: Family  History  Problem Relation Age of Onset  . Cervical cancer Mother   . Heart disease Father     MI  . Coronary artery disease Brother   . Hypotension Sister   . Colon cancer Neg Hx   . Colon polyps Sister   . Pancreatic cancer      1/2 sister  . Coronary artery disease Sister     pacemaker  . Asthma Brother   . Asthma Sister     SOCIAL HISTORY:  reports that she has never smoked. She has never used smokeless tobacco. She reports that she does not drink alcohol or use illicit drugs.  REVIEW OF SYSTEMS:  Constitutional:   No  weight loss, night sweats  HEENT:    No headaches, Difficulty swallowing,Tooth/dental problems,Sore throat,  No sneezing, itching, ear ache, nasal congestion, post nasal drip,   Cardio-vascular: No chest pain,  Orthopnea, PND, swelling in lower extremities, anasarca,  dizziness, palpitations  GI:  No heartburn, indigestion, abdominal pain, nausea, vomiting, diarrhea, change in bowel habits, loss of appetite  Resp: No shortness of breath with exertion or at rest.  No coughing up of blood.No change in color of mucus.No wheezing.No chest wall deformity  Skin:  no rash or lesions.  GU:  no dysuria, change in color of urine, no urgency or frequency.  No flank pain.  Musculoskeletal: No joint pain or swelling.  No decreased range of motion.  No back pain.  Psych: No change in mood or affect. No depression or anxiety.  No memory loss.   PHYSICAL EXAM: Blood pressure 108/60, pulse 89, temperature 98.8 F (37.1 C), temperature source Oral, resp. rate 24, height 5' (1.524 m), weight 51.256 kg (113 lb), SpO2 97.00%.  General appearance :Awake, alert, not in any distress. Speech Clear. Not toxic Looking HEENT: Atraumatic and Normocephalic, pupils equally reactive to light and accomodation Neck: supple, no JVD. No cervical lymphadenopathy.  Chest:Good air entry bilaterally, coarse breath souds, left sided rales at base CVS: S1 S2 regular, no murmurs.    Abdomen: Bowel sounds present, Non tender and not distended with no gaurding, rigidity or rebound. Extremities: B/L Lower Ext shows no edema, both legs are warm to touch Neurology: Awake alert, and oriented X 3, CN II-XII intact, Non focal Skin:No Rash Wounds:N/A  LABS ON ADMISSION:   Recent Labs  02/09/14 1317  NA 135*  K 3.1*  CL 94*  GLUCOSE 100*  BUN 12  CREATININE 0.90   No results found for this basename: AST, ALT, ALKPHOS, BILITOT, PROT, ALBUMIN,  in the last 72 hours No results found for this basename: LIPASE, AMYLASE,  in the last 72 hours  Recent Labs  02/09/14 1250  02/09/14 1317  WBC 14.9*  --   HGB 11.7* 13.3  HCT 36.6 39.0  MCV 77.4*  --   PLT 351  --    No results found for this basename: CKTOTAL, CKMB, CKMBINDEX, TROPONINI,  in the last 72 hours No results found for this basename: DDIMER,  in the last 72 hours No components found with this basename: POCBNP,    RADIOLOGIC STUDIES ON ADMISSION: Dg Chest 2 View  02/09/2014   CLINICAL DATA:  Chronic cough for 3 months pneumonia, chest pain due to cough, weakness, history asthma, esophageal cancer  EXAM: CHEST  2 VIEW  COMPARISON:  01/22/2014  FINDINGS: Enlargement of cardiac silhouette.  Calcified tortuous aorta.  Pulmonary vascular congestion.  Emphysematous and bronchitic changes consistent with COPD.  Chronic atelectasis versus infiltrate LEFT lower lobe.  Remaining lungs grossly clear.  No pleural effusion or pneumothorax.  Diffuse osseous demineralization.  Post esophageal resection and gastric pull-up with duodenal stent placement.  IMPRESSION: Enlargement of cardiac silhouette with pulmonary vascular congestion.  COPD changes with chronic atelectasis versus infiltrate LEFT lower lobe.  Gastric pull-up with stable appearance of duodenal stent.  No acute abnormalities.   Electronically Signed   By: Lavonia Dana M.D.   On: 02/09/2014 12:57     EKG: Independently reviewed. NSR  ASSESSMENT AND PLAN: Present  on Admission:  .Suspected Recurrent Aspiration PNA (pneumonia) -given complicated GI hx of esophagectomy with gastric pull up resultant reflux, suspect that her symptoms are consistent with Aspiration PNA. Unfortunately with maximal medical therapy, lifestyle adjustments this continues to be a recurrent issue.  -Will admit, start empiric Vanco/Zosyn,follow blood cultures. I suspect she will remain at risk for aspiration episodes in the future. Although with leukocytosis, and fever yesterday, she appears not toxic appearing. No fever noted in the ED.   Marland KitchenAcute Hypoxic Resp Failure -suspect secondary to above -continue with O2, although has bilateral lower ext edema, low suspicion for Pul Embolism at this point. Will get a lower ext doppler.  Marland Kitchen A-fib -rate controlled, c/w Amiodarone. Reviewed cardiology notes from prior admits, not a anticoagulation candidate because of prior hemoptysis.  -monitor in telemetry  .Bilateral Lower Ext edema -not sure what the exact etiology here is, although has hx of Diastolic CHF, but has no other signs of overt CHF exacerbation. UA negative for proteinuria. Check Albumin and LFT's -Cautiously diurese with Lasix, obtain Lower ext doppler  . Chronic diastolic CHF (congestive heart failure) -as above  . GERD -c/w PPI,carafate  . HX OF ESOPHAGEAL CANCER -as above  . HYPERTENSION -c/w Lasix  . Tremor -has chronic tremor-presumably from chronic Reglan therapy.  Further plan will depend as patient's clinical course evolves and further radiologic and laboratory data become available. Patient will be monitored closely.  Above noted plan was discussed with patient,she was in agreement.   DVT Prophylaxis: Prophylactic Heparin  Code Status: Full Code  Total time spent for admission equals 45 minutes.  [facility] Pager [phone]  If 7PM-7AM, please contact night-coverage [email or web address] Password [email or web address] 02/09/2014, 2:45  PM  **Disclaimer: This note may have been dictated with voice recognition software. Similar sounding words can inadvertently be transcribed and this note may contain transcription errors which may not have been corrected upon publication of note.**

## 2014-02-10 ENCOUNTER — Telehealth: Payer: Self-pay | Admitting: Internal Medicine

## 2014-02-10 DIAGNOSIS — J9600 Acute respiratory failure, unspecified whether with hypoxia or hypercapnia: Secondary | ICD-10-CM

## 2014-02-10 DIAGNOSIS — J690 Pneumonitis due to inhalation of food and vomit: Principal | ICD-10-CM

## 2014-02-10 DIAGNOSIS — J9601 Acute respiratory failure with hypoxia: Secondary | ICD-10-CM | POA: Diagnosis present

## 2014-02-10 DIAGNOSIS — IMO0002 Reserved for concepts with insufficient information to code with codable children: Secondary | ICD-10-CM

## 2014-02-10 DIAGNOSIS — M7989 Other specified soft tissue disorders: Secondary | ICD-10-CM

## 2014-02-10 LAB — BASIC METABOLIC PANEL
ANION GAP: 12 (ref 5–15)
BUN: 11 mg/dL (ref 6–23)
CHLORIDE: 95 meq/L — AB (ref 96–112)
CO2: 31 mEq/L (ref 19–32)
Calcium: 8.6 mg/dL (ref 8.4–10.5)
Creatinine, Ser: 0.85 mg/dL (ref 0.50–1.10)
GFR calc non Af Amer: 62 mL/min — ABNORMAL LOW (ref >90)
GFR, EST AFRICAN AMERICAN: 72 mL/min — AB (ref >90)
Glucose, Bld: 94 mg/dL (ref 70–99)
POTASSIUM: 3 meq/L — AB (ref 3.7–5.3)
SODIUM: 138 meq/L (ref 137–147)

## 2014-02-10 LAB — CBC
HCT: 32.6 % — ABNORMAL LOW (ref 36.0–46.0)
Hemoglobin: 10.5 g/dL — ABNORMAL LOW (ref 12.0–15.0)
MCH: 25.6 pg — AB (ref 26.0–34.0)
MCHC: 32.2 g/dL (ref 30.0–36.0)
MCV: 79.5 fL (ref 78.0–100.0)
Platelets: 319 10*3/uL (ref 150–400)
RBC: 4.1 MIL/uL (ref 3.87–5.11)
RDW: 16.7 % — ABNORMAL HIGH (ref 11.5–15.5)
WBC: 9.7 10*3/uL (ref 4.0–10.5)

## 2014-02-10 MED ORDER — POTASSIUM CHLORIDE CRYS ER 20 MEQ PO TBCR
40.0000 meq | EXTENDED_RELEASE_TABLET | Freq: Every day | ORAL | Status: DC
  Administered 2014-02-11 – 2014-02-12 (×2): 40 meq via ORAL
  Filled 2014-02-10 (×3): qty 2

## 2014-02-10 MED ORDER — POTASSIUM CHLORIDE CRYS ER 20 MEQ PO TBCR
40.0000 meq | EXTENDED_RELEASE_TABLET | Freq: Two times a day (BID) | ORAL | Status: AC
  Administered 2014-02-10 (×2): 40 meq via ORAL
  Filled 2014-02-10: qty 2

## 2014-02-10 MED ORDER — AMPICILLIN-SULBACTAM SODIUM 1.5 (1-0.5) G IJ SOLR
1.5000 g | Freq: Four times a day (QID) | INTRAMUSCULAR | Status: DC
  Administered 2014-02-10 – 2014-02-11 (×4): 1.5 g via INTRAVENOUS
  Filled 2014-02-10 (×7): qty 1.5

## 2014-02-10 NOTE — Telephone Encounter (Signed)
Called spoke with [place].  She did inform pt doc for Pulmonary to consult on pt. She is not sure if they will or not. I will also forward to MW as an FYI

## 2014-02-10 NOTE — Progress Notes (Signed)
*  PRELIMINARY RESULTS* Vascular Ultrasound Lower extremity venous duplex has been completed.  Preliminary findings: no evidence of DVT  Landry Mellow, RDMS, RVT  02/10/2014, 10:21 AM

## 2014-02-10 NOTE — Consult Note (Signed)
WOC wound consult note Reason for Consult: left arm wound at elbow; pt states she fell at home prior to admission. Wound type: full thickness Measurement: 3cm x 2cm with hypergranulation noted.   Wound bed: serous, moist, pink Drainage: none, moist Periwound: skin intact, with previous scar tissue noted Dressing procedure/placement/frequency: Applied mepilex.  Pt educate on having wound covered with light dressing to prevent further injury while allowing for airflow to wound site for healing. Pt confirmed understanding.  Larene Beach, BSN, RN , MSN-student Please re-consult if further assistance is needed.  Thank-you,  Julien Girt MSN, [place], [place], [place], [place]

## 2014-02-10 NOTE — Progress Notes (Signed)
[facility] PROGRESS NOTE  Assessment/Plan: Acute hypoxic resp with hypoxia due to Aspiration pneumonia - de-escalate antibiotics to Unasyn. - afebrile, leukocytosis resolved. - unrealistic expectation about her condition this is the 4th episode of aspiration PNA this year.  Chronic diastolic CHF (congestive heart failure) - compensated. - Hold lasix.   A-fib - Rate controlled, c/w Amiodarone.  - Not a anticoagulation candidate because of prior hemoptysis.   Bilateral Lower Ext edema  - Not sure what the exact etiology here is, although has hx of Diastolic CHF. - No JVD. - doppler of lower ext. Pending.    HYPERTENSION - bp boderline. - hold lasix.  GERD - PPI and reglan.     Code Status: full Family Communication: none  Disposition Plan: inpatient   Consultants:  none  Procedures:  CXR  Antibiotics:  unasyn  HPI/Subjective: No complains  Objective: Filed Vitals:   02/10/14 0243 02/10/14 0425 02/10/14 0639 02/10/14 0815  BP:   91/51   Pulse: 86  85   Temp:   99.1 F (37.3 C)   TempSrc:   Oral   Resp: 16  17   Height:      Weight:  49.306 kg (108 lb 11.2 oz)    SpO2: 92%  91% 91%    Intake/Output Summary (Last 24 hours) at 02/10/14 0827 Last data filed at 02/10/14 0640  Gross per 24 hour  Intake      0 ml  Output    700 ml  Net   -700 ml   Filed Weights   02/09/14 1158 02/09/14 1550 02/10/14 0425  Weight: 51.256 kg (113 lb) 49.1 kg (108 lb 3.9 oz) 49.306 kg (108 lb 11.2 oz)    Exam:  General: Alert, awake, oriented x3, in no acute distress.  HEENT: No bruits, no goiter. -JVD Heart: Regular rate and rhythm, trace edema Lungs: Good air movement, crackles B/L Abdomen: Soft, nontender, nondistended, positive bowel sounds.    Data Reviewed: Basic Metabolic Panel:  Recent Labs Lab 02/09/14 1317 02/10/14 0412  NA 135* 138  K 3.1* 3.0*  CL 94* 95*  CO2  --  31  GLUCOSE 100* 94  BUN 12 11  CREATININE 0.90 0.85    CALCIUM  --  8.6   Liver Function Tests: No results found for this basename: AST, ALT, ALKPHOS, BILITOT, PROT, ALBUMIN,  in the last 168 hours No results found for this basename: LIPASE, AMYLASE,  in the last 168 hours No results found for this basename: AMMONIA,  in the last 168 hours CBC:  Recent Labs Lab 02/09/14 1250 02/09/14 1317 02/10/14 0412  WBC 14.9*  --  9.7  HGB 11.7* 13.3 10.5*  HCT 36.6 39.0 32.6*  MCV 77.4*  --  79.5  PLT 351  --  319   Cardiac Enzymes: No results found for this basename: CKTOTAL, CKMB, CKMBINDEX, TROPONINI,  in the last 168 hours BNP (last 3 results)  Recent Labs  02/25/13 1108  PROBNP 185.0*   CBG: No results found for this basename: GLUCAP,  in the last 168 hours  Recent Results (from the past 240 hour(s))  CULTURE, BLOOD (ROUTINE X 2)     Status: None   Collection Time    02/09/14  2:10 PM      Result Value Ref Range Status   Specimen Description BLOOD RIGHT HAND   Final   Special Requests BOTTLES DRAWN AEROBIC ONLY 5 CC   Final   Culture  Setup Time  Final   Value: 02/09/2014 23:05     Performed at [facility]   Culture     Final   Value:        BLOOD CULTURE RECEIVED NO GROWTH TO DATE CULTURE WILL BE HELD FOR 5 DAYS BEFORE ISSUING A FINAL NEGATIVE REPORT     Performed at [facility]   Report Status PENDING   Incomplete  CULTURE, BLOOD (ROUTINE X 2)     Status: None   Collection Time    02/09/14  2:10 PM      Result Value Ref Range Status   Specimen Description BLOOD LEFT HAND   Final   Special Requests BOTTLES DRAWN AEROBIC AND ANAEROBIC 6 CC EACH   Final   Culture  Setup Time     Final   Value: 02/09/2014 23:05     Performed at [facility]   Culture     Final   Value:        BLOOD CULTURE RECEIVED NO GROWTH TO DATE CULTURE WILL BE HELD FOR 5 DAYS BEFORE ISSUING A FINAL NEGATIVE REPORT     Performed at [facility]   Report Status PENDING   Incomplete     Studies: Dg Chest 2  View  02/09/2014   CLINICAL DATA:  Chronic cough for 3 months pneumonia, chest pain due to cough, weakness, history asthma, esophageal cancer  EXAM: CHEST  2 VIEW  COMPARISON:  01/22/2014  FINDINGS: Enlargement of cardiac silhouette.  Calcified tortuous aorta.  Pulmonary vascular congestion.  Emphysematous and bronchitic changes consistent with COPD.  Chronic atelectasis versus infiltrate LEFT lower lobe.  Remaining lungs grossly clear.  No pleural effusion or pneumothorax.  Diffuse osseous demineralization.  Post esophageal resection and gastric pull-up with duodenal stent placement.  IMPRESSION: Enlargement of cardiac silhouette with pulmonary vascular congestion.  COPD changes with chronic atelectasis versus infiltrate LEFT lower lobe.  Gastric pull-up with stable appearance of duodenal stent.  No acute abnormalities.   Electronically Signed   By: Lavonia Dana M.D.   On: 02/09/2014 12:57    Scheduled Meds: . amiodarone  200 mg Oral Daily  . budesonide  0.25 mg Nebulization BID  . furosemide  40 mg Intravenous Daily  . heparin  5,000 Units Subcutaneous 3 times per day  . ipratropium-albuterol  3 mL Nebulization Q6H  . metoCLOPramide  5 mg Oral BID  . multivitamin with minerals  1 tablet Oral Daily  . olopatadine  1 drop Both Eyes BID  . pantoprazole  40 mg Oral BID  . piperacillin-tazobactam (ZOSYN)  IV  3.375 g Intravenous 3 times per day  . potassium chloride  40 mEq Oral Once  . potassium chloride  40 mEq Oral Daily  . sodium chloride  3 mL Intravenous Q12H  . sucralfate  1 g Oral BID AC  . vancomycin  750 mg Intravenous Q24H  . [START ON 02/14/2014] Vitamin D (Ergocalciferol)  50,000 Units Oral Q Fri  . zolpidem  5 mg Oral QHS   Continuous Infusions:    Charlynne Cousins  [facility] Pager [phone]. If 8PM-8AM, please contact night-coverage at [email or web address], password [email or web address] 02/10/2014, 8:27 AM  LOS: 1 day      **Disclaimer: This note may have been dictated with voice  recognition software. Similar sounding words can inadvertently be transcribed and this note may contain transcription errors which may not have been corrected upon publication of note.**

## 2014-02-10 NOTE — Evaluation (Signed)
Physical Therapy Evaluation Patient Details Name: [name] MRN: [id number] DOB: 1928/10/22 Today's Date: 02/10/2014   History of Present Illness  [name] is a 80 y.o. female with a Past Medical History of esophageal cancer s/p esophagogastrectomy and gastric pullup in 1993,history of anastomotic stenosis requiring dilation in March 2013,hx of Gastric outlet obstruction requiring duodennal stent placement in April 2013 and July 2014,she underwent EGD for recurrent n/v with findings of a mild GEJ stricture and pyloric stenosis.She has had recurrent admissions for aspiration PNA, infact was discharged from this facility on 01/27/14. She apparently just completed a course of antibiotics a few days back. Approximately, 2 days back, patient started developing productive cough with yellow sputum, generalized weakness and subjective fever. She claims that her symptoms worsened dramatically yesterday, and was noted to have a fever of 102 F. She apparently had a CBC done yesterday which showed a leukocytosis of 23 thousand. She was then referred to the ED for further evaluation and treatment. This is 4th bout ao aspiration PNA this year, including CIR stay in 6/15.  Clinical Impression  Pt admitted with recurrence of PNA and generalized weakness in the setting of having been at [facility] for rehab. Pt currently with functional limitations due to the deficits listed below (see PT Problem List). Pt ambulated 300' with RW and min-guard A on eval.  Pt will benefit from skilled PT to increase their independence and safety with mobility to allow discharge to the venue listed below. She is hoping to finish her rehab stay and then return home.      Follow Up Recommendations SNF;Supervision/Assistance - 24 hour    Equipment Recommendations  None recommended by PT    Recommendations for Other Services OT consult     Precautions / Restrictions Precautions Precautions: Fall Restrictions Weight  Bearing Restrictions: No      Mobility  Bed Mobility               General bed mobility comments: pt received sitting EOB  Transfers Overall transfer level: Needs assistance Equipment used: Rolling walker (2 wheeled) Transfers: Sit to/from Stand Sit to Stand: Min guard Stand pivot transfers: Min guard       General transfer comment: vc's to remind of safe hand placement, min-guard as pt gained initial balance in standing  Ambulation/Gait Ambulation/Gait assistance: Min guard Ambulation Distance (Feet): 300 Feet Assistive device: Rolling walker (2 wheeled) Gait Pattern/deviations: Step-through pattern;Decreased stride length Gait velocity: slightly decreased   General Gait Details: pt has been working on picking up feet instead of shuffling and she is doing well with this. vc's for gaze direction down hall instead of at feet, vc's for erect posture  Stairs            Wheelchair Mobility    Modified Rankin (Stroke Patients Only)       Balance Overall balance assessment: Needs assistance Sitting-balance support: No upper extremity supported;Feet supported Sitting balance-Leahy Scale: Fair     Standing balance support: No upper extremity supported;During functional activity Standing balance-Leahy Scale: Fair Standing balance comment: pt safer with UE support but able to stand for short time for self-care without UE support                             Pertinent Vitals/Pain Faces 4/10 neck pain, RN notified    Home Living Family/patient expects to be discharged to:: Skilled nursing facility Living Arrangements: Alone Available Help at  Discharge: Available PRN/intermittently;Neighbor;Family Type of Home: House Home Access: Stairs to enter Entrance Stairs-Rails: Left Entrance Stairs-Number of Steps: 5 Home Layout: One level Home Equipment: Walker - 2 wheels;Shower seat - built in;Grab bars - toilet;Grab bars - tub/shower;Hand held shower  head Additional Comments: above info is about her home but she has one week left of her stay at [facility] for rehab and plans to reurn there before home    Prior Function Level of Independence: Needs assistance   Gait / Transfers Assistance Needed: independent with RW  ADL's / Homemaking Assistance Needed: needs assist with home mgmt, was able to bathe and dress self  Comments: sleeps in recliner     Hand Dominance   Dominant Hand: Right    Extremity/Trunk Assessment   Upper Extremity Assessment: Overall WFL for tasks assessed           Lower Extremity Assessment: Generalized weakness      Cervical / Trunk Assessment: Other exceptions;Kyphotic  Communication   Communication: No difficulties  Cognition Arousal/Alertness: Awake/alert Behavior During Therapy: WFL for tasks assessed/performed Overall Cognitive Status: Within Functional Limits for tasks assessed                      General Comments General comments (skin integrity, edema, etc.): wound left elbow, has been present one month, pt reports MD wants her to leave it undressed    Exercises Other Exercises Other Exercises: cervical mobilization exercises in sitting      Assessment/Plan    PT Assessment Patient needs continued PT services  PT Diagnosis Difficulty walking;Generalized weakness   PT Problem List Decreased strength;Decreased activity tolerance;Decreased range of motion;Decreased balance;Decreased mobility;Pain;Cardiopulmonary status limiting activity  PT Treatment Interventions DME instruction;Gait training;Functional mobility training;Therapeutic activities;Therapeutic exercise;Balance training;Patient/family education   PT Goals (Current goals can be found in the Care Plan section) Acute Rehab PT Goals Patient Stated Goal: return to [facility] and then home PT Goal Formulation: With patient Time For Goal Achievement: 02/24/14 Potential to Achieve Goals: Good    Frequency Min  3X/week   Barriers to discharge Decreased caregiver support      Co-evaluation               End of Session Equipment Utilized During Treatment: Gait belt Activity Tolerance: Patient tolerated treatment well Patient left: in chair;with call bell/phone within reach;with family/visitor present Nurse Communication: Mobility status         Time: 1032-1107 PT Time Calculation (min): 35 min   Charges:   PT Evaluation $Initial PT Evaluation Tier I: 1 Procedure PT Treatments $Gait Training: 8-22 mins $Therapeutic Exercise: 8-22 mins   PT G Codes:        Leighton Roach, PT  Acute Rehab Services  [place], [place] 02/10/2014, 11:23 AM

## 2014-02-10 NOTE — Progress Notes (Signed)
INITIAL NUTRITION ASSESSMENT  DOCUMENTATION CODES Per approved criteria  -Not Applicable   INTERVENTION: Recommend liberalizing diet to regular Allow Magic Cup ice cream with lunch and dinner Provide Snacks TID Encourage PO intake  NUTRITION DIAGNOSIS: Inadequate oral intake related to decreased appetite during acute illness as evidenced by 6% weight loss in less than 2 months.   Goal: Pt to meet >/= 90% of their estimated nutrition needs   Monitor:  PO intake, weight trend, labs, I/O's  Reason for Assessment: Malnutrition Screening Tool, score of 2  80 y.o. female  Admitting Dx: Aspiration pneumonia  ASSESSMENT: 80 y.o. female with a Past Medical History of esophageal cancer s/p esophagogastrectomy and gastric pullup in 1993,history of anastomotic stenosis requiring dilation in March 2013,hx of Gastric outlet obstruction requiring duodennal stent placement in April 2013 and July 2014,she underwent EGD for recurrent n/v with findings of a mild GEJ stricture and pyloric stenosis.She has had recurrent admissions for aspiration PNA, infact was discharged from this facility on 01/27/14. She apparently just completed a course of antibiotics a few days back. Approximately, 2 days back, patient started developing productive cough with yellow sputum, generalized weakness and subjective fever. She claims that her symptoms worsened dramatically yesterday, and was noted to have a fever of 102 F.   Pt reports that she usually weighs around 114 lbs but, she has lost weight due to decreased appetite when she has pneumonia. She reports eating very little for 3 days during recent pneumonia incident. Pt states her appetite is better today but, she ate less than 25% of breakfast because the food was cold and only about 15% of lunch. She states she ate better during previous admission because she was on a regular diet. Pt states that she usually eats 4-5 small meals daily due to her history of esophagus  surgery and reflux. She also reports drinking Great Shakes PTA and would like to receive them as a snack.  Labs: low potassium, low hemoglobin  Nutrition Focused Physical Exam:  Subcutaneous Fat:  Orbital Region: wnl Upper Arm Region: mild wasting Thoracic and Lumbar Region: NA  Muscle:  Temple Region: moderate wasting Clavicle Bone Region: moderate wasting Clavicle and Acromion Bone Region: mild wasting Scapular Bone Region: NA Dorsal Hand: mild wasting Patellar Region: mild wasting Anterior Thigh Region: wnl Posterior Calf Region: wnl  Edema: none  Height: Ht Readings from Last 1 Encounters:  02/09/14 5' (1.524 m)    Weight: Wt Readings from Last 1 Encounters:  02/10/14 108 lb 11.2 oz (49.306 kg)    Ideal Body Weight: 100 lbs  % Ideal Body Weight: 108%  Wt Readings from Last 10 Encounters:  02/10/14 108 lb 11.2 oz (49.306 kg)  01/22/14 123 lb 14.4 oz (56.2 kg)  01/09/14 123 lb 10.9 oz (56.1 kg)  01/06/14 115 lb 11.9 oz (52.5 kg)  12/20/13 114 lb (51.71 kg)  12/10/13 114 lb (51.71 kg)  12/02/13 112 lb (50.803 kg)  11/12/13 116 lb (52.617 kg)  11/11/13 115 lb (52.164 kg)  10/28/13 115 lb (52.164 kg)    Usual Body Weight: 114 lbs  % Usual Body Weight: 95%  BMI:  Body mass index is 21.23 kg/(m^2).  Estimated Nutritional Needs: Kcal: 1200-1380 Protein: 60-70 grams Fluid: 1.2 L/day  Skin: intact  Diet Order: Cardiac  EDUCATION NEEDS: -No education needs identified at this time   Intake/Output Summary (Last 24 hours) at 02/10/14 1314 Last data filed at 02/10/14 1002  Gross per 24 hour  Intake  120 ml  Output    700 ml  Net   -580 ml    Last BM: 7/11  Labs:   Recent Labs Lab 02/09/14 1317 02/10/14 0412  NA 135* 138  K 3.1* 3.0*  CL 94* 95*  CO2  --  31  BUN 12 11  CREATININE 0.90 0.85  CALCIUM  --  8.6  GLUCOSE 100* 94    CBG (last 3)  No results found for this basename: GLUCAP,  in the last 72 hours  Scheduled Meds: .  amiodarone  200 mg Oral Daily  . ampicillin-sulbactam (UNASYN) IV  1.5 g Intravenous Q6H  . budesonide  0.25 mg Nebulization BID  . heparin  5,000 Units Subcutaneous 3 times per day  . ipratropium-albuterol  3 mL Nebulization Q6H  . metoCLOPramide  5 mg Oral BID  . multivitamin with minerals  1 tablet Oral Daily  . olopatadine  1 drop Both Eyes BID  . pantoprazole  40 mg Oral BID  . potassium chloride  40 mEq Oral Once  . potassium chloride  40 mEq Oral BID  . [START ON 02/11/2014] potassium chloride  40 mEq Oral Daily  . sodium chloride  3 mL Intravenous Q12H  . sucralfate  1 g Oral BID AC  . [START ON 02/14/2014] Vitamin D (Ergocalciferol)  50,000 Units Oral Q Fri  . zolpidem  5 mg Oral QHS    Continuous Infusions:   Past Medical History  Diagnosis Date  . GERD (gastroesophageal reflux disease)   . Osteoporosis   . Postmenopausal HRT (hormone replacement therapy)   . Anemia   . Allergy   . Asthma   . Shortness of breath   . PONV (postoperative nausea and vomiting)   . Dysrhythmia     a fib  . Chronic bronchitis     "practically q winter" (01/22/2014)  . History of blood transfusion 1993    "related to esophagus removal"  . Arthritis     "legs" (01/06/2014)  . Pneumonia     "all the time; probably 5 times in the last year, counting today" (01/06/2014)  . Aspiration pneumonia 11/22/2013  . H/O hiatal hernia   . Headache(784.0)     "monthly in the past year" (01/22/2014)  . Chronic lower back pain     "maybe cause I fell in the tub on 01/05/2014"  . Urinary frequency   . Esophageal cancer     removed esophagus stomach pulled up; "encapsulated; didn't have to have radiation"  . Basal cell carcinoma of face     "several; freezes them off; face"    Past Surgical History  Procedure Laterality Date  . Left oophorectomy  1952  . Esophageal removal of cancer,gastric ppull-thru 1993    . Forearm fracture surgery Right ?1990's  . Esophagogastroduodenoscopy  10/19/2011     Procedure: ESOPHAGOGASTRODUODENOSCOPY (EGD);  Surgeon: Milus Banister, MD;  Location: [facility];  Service: Endoscopy;  Laterality: N/A;  . Balloon dilation  10/19/2011    Procedure: BALLOON DILATION;  Surgeon: Milus Banister, MD;  Location: [facility] END[facility]COPY;  Service: Endoscopy;  Laterality: N/A;  . Esophagogastroduodenoscopy  11/07/2011    Procedure: ESOPHAGOGASTRODUODENOSCOPY (EGD);  Surgeon: Ladene Artist, MD,FACG;  Location: [facility] ENDOSCOPY;  Service: Endoscopy;  Laterality: N/A;  . Esophagogastroduodenoscopy  11/22/2011    Procedure: ESOPHAGOGASTRODUODENOSCOPY (EGD);  Surgeon: Inda Castle, MD;  Location: [facility];  Service: Endoscopy;  Laterality: N/A;  . Esophagogastroduodenoscopy  11/24/2011    Procedure: ESOPHAGOGASTRODUODENOSCOPY (EGD);  Surgeon: Inda Castle, MD;  Location: [facility];  Service: Endoscopy;  Laterality: N/A;  with removable duodenal stent (actually using esophageal partially covered 23X15 located in locked supply room  . Duodenal stent placement  11/24/2011    Procedure: DUODENAL STENT PLACEMENT;  Surgeon: Inda Castle, MD;  Location: [facility]OS[facility];  Service: Endoscopy;  Laterality: N/A;  . Video bronchoscopy Bilateral 01/18/2013    Procedure: VIDEO BRONCHOSCOPY WITHOUT FLUORO;  Surgeon: Tanda Rockers, MD;  Location: [facility] ENDOSCOPY;  Service: Cardiopulmonary;  Laterality: Bilateral;  . Esophagogastroduodenoscopy N/A 02/11/2013    Procedure: ESOPHAGOGASTRODUODENOSCOPY (EGD);  Surgeon: Inda Castle, MD;  Location: [facility];  Service: Endoscopy;  Laterality: N/A;  . Duodenal stent placement N/A 02/11/2013    Procedure: DUODENAL STENT PLACEMENT;  Surgeon: Inda Castle, MD;  Location: [facility];  Service: Endoscopy;  Laterality: N/A;  . Esophagogastroduodenoscopy N/A 09/11/2013    Procedure: ESOPHAGOGASTRODUODENOSCOPY (EGD);  Surgeon: Gatha Mayer, MD;  Location: [facility];  Service: Endoscopy;  Laterality: N/A;  . Cholecystectomy  ~ 1996  . Dilation and  curettage of uterus  X 6-7  . Cataract extraction w/ intraocular lens  implant, bilateral Bilateral ~ 2000  . Appendectomy      Pryor Ochoa RD, LDN Inpatient Clinical Dietitian Pager: [phone] After Hours Pager: [phone]

## 2014-02-11 DIAGNOSIS — I2789 Other specified pulmonary heart diseases: Secondary | ICD-10-CM

## 2014-02-11 DIAGNOSIS — E876 Hypokalemia: Secondary | ICD-10-CM

## 2014-02-11 MED ORDER — POTASSIUM CHLORIDE 20 MEQ/15ML (10%) PO LIQD
40.0000 meq | Freq: Two times a day (BID) | ORAL | Status: DC
  Administered 2014-02-11: 40 meq via ORAL
  Filled 2014-02-11 (×4): qty 30

## 2014-02-11 MED ORDER — AMOXICILLIN-POT CLAVULANATE 875-125 MG PO TABS
1.0000 | ORAL_TABLET | Freq: Two times a day (BID) | ORAL | Status: DC
  Administered 2014-02-11 – 2014-02-12 (×3): 1 via ORAL
  Filled 2014-02-11 (×4): qty 1

## 2014-02-11 MED ORDER — POTASSIUM CHLORIDE 20 MEQ/15ML (10%) PO LIQD
40.0000 meq | Freq: Once | ORAL | Status: AC
  Administered 2014-02-11: 40 meq via ORAL
  Filled 2014-02-11: qty 30

## 2014-02-11 MED ORDER — IPRATROPIUM-ALBUTEROL 0.5-2.5 (3) MG/3ML IN SOLN
3.0000 mL | Freq: Three times a day (TID) | RESPIRATORY_TRACT | Status: DC
  Administered 2014-02-12 (×2): 3 mL via RESPIRATORY_TRACT
  Filled 2014-02-11 (×2): qty 3

## 2014-02-11 NOTE — Progress Notes (Signed)
UR completed Harrietta Incorvaia K. Elvin Mccartin, RN, BSN, [place], CCM  02/11/2014 11:08 AM

## 2014-02-11 NOTE — Care Management Note (Addendum)
  Page 2 of 2   02/13/2014     9:45:16 AM CARE MANAGEMENT NOTE 02/13/2014  Patient:  [name], [name]   Account Number:  [id number]  Date Initiated:  02/11/2014  Documentation initiated by:  Mariann Laster  Subjective/Objective Assessment:   PNA: aspiration     Action/Plan:   CM to follow for disposition needs   Anticipated DC Date:  02/13/2014   Anticipated DC Plan:  SKILLED NURSING FACILITY  In-house referral  Clinical Social Worker      DC Planning Services  CM consult      PAC Choice  HOSPICE   Choice offered to / List presented to:  C-4 Adult Children           HH agency  [facility]   Status of service:  Completed, signed off Medicare Important Message given?  YES (If response is "NO", the following Medicare IM given date fields will be blank) Date Medicare IM given:  02/12/2014 Medicare IM given by:  Dalary Hollar Date Additional Medicare IM given:   Additional Medicare IM given by:    Discharge Disposition:  [place]  Per UR Regulation:  Reviewed for med. necessity/level of care/duration of stay  If discussed at [place] of Stay Meetings, dates discussed:    Comments:  Rashell Shambaugh RN, BSN, MSHL, CCM  Nurse - Case Manager,  ([facility])  [phone]  02/13/2014 TC from [facility]:  contact [place] states not contracted with Karenann Cai.   [place] serves Dustin Flock MR faxed to  [facility]. (Referral sent for Hospice and Palliative Services )   Contact information:   [address] [place] [address] [phone] CM contacted [place] via TC to confirm receipt of fax:  contact: Natalie 02/13/14 at 9:37am TC back from [place] stating she contacted Dustin Flock who stated patient is active on Med A days and  [facility] will not pay for Hospice during this time; patient would need to come off Med A Days in order to get Hospice services.  Lanelle Bal will contact patient and family to determne what patient elects to do at this time.    Ariyannah Pauling RN, BSN, MSHL, CCM  Nurse - Case Manager,  (Unit [place])  [phone]  02/12/2014 Cough, fever, gen weakness-2 days, Pneumonia d/t aspiration complicated GI hx of esophagectomy with gastric pull up resultant reflux Social:  From [place] Primary support name:  [name] Ritter/DTR [phone]  cell Per SW/Donna Pauline Good; hospice services requested by DTR. CM notified MD. Referral faxed to [facility].

## 2014-02-11 NOTE — Progress Notes (Signed)
[facility] PROGRESS NOTE  Assessment/Plan: Acute hypoxic resp with hypoxia due to Aspiration pneumonia - de-escalate antibiotics to Unasyn. - afebrile, leukocytosis resolved. - unrealistic expectation about her condition this is the 4th episode of aspiration PNA this year. - Use reflux precautions (raise HOB, No eating 2 hours before bedtime)  Chronic diastolic CHF (congestive heart failure) - compensated. - Hold lasix. Replete KCL orally, recheck in am.   A-fib - Rate controlled, c/w Amiodarone.  - Not a anticoagulation candidate because of prior hemoptysis.   Bilateral Lower Ext edema  - Not sure what the exact etiology here is, although has hx of Diastolic CHF. - No JVD. - doppler of lower ext. Pending.    HYPERTENSION - bp boderline. - hold lasix.  GERD - PPI and reglan.     Code Status: full Family Communication: none  Disposition Plan: inpatient   Consultants:  none  Procedures:  CXR  Antibiotics:  unasyn  HPI/Subjective: No complains  Objective: Filed Vitals:   02/11/14 0201 02/11/14 0524 02/11/14 0836 02/11/14 0947  BP:  93/59  122/71  Pulse:  85  97  Temp:  98.2 F (36.8 C)  98.2 F (36.8 C)  TempSrc:  Oral  Oral  Resp:  18  18  Height:      Weight:  49.125 kg (108 lb 4.8 oz)    SpO2: 98% 98% 98% 98%    Intake/Output Summary (Last 24 hours) at 02/11/14 1023 Last data filed at 02/11/14 0932  Gross per 24 hour  Intake    893 ml  Output    825 ml  Net     68 ml   Filed Weights   02/09/14 1550 02/10/14 0425 02/11/14 0524  Weight: 49.1 kg (108 lb 3.9 oz) 49.306 kg (108 lb 11.2 oz) 49.125 kg (108 lb 4.8 oz)    Exam:  General: Alert, awake, oriented x3, in no acute distress.  HEENT: No bruits, no goiter. -JVD Heart: Regular rate and rhythm, trace edema Lungs: Good air movement, crackles B/L Abdomen: Soft, nontender, nondistended, positive bowel sounds.    Data Reviewed: Basic Metabolic Panel:  Recent Labs Lab  02/09/14 1317 02/10/14 0412  NA 135* 138  K 3.1* 3.0*  CL 94* 95*  CO2  --  31  GLUCOSE 100* 94  BUN 12 11  CREATININE 0.90 0.85  CALCIUM  --  8.6   Liver Function Tests: No results found for this basename: AST, ALT, ALKPHOS, BILITOT, PROT, ALBUMIN,  in the last 168 hours No results found for this basename: LIPASE, AMYLASE,  in the last 168 hours No results found for this basename: AMMONIA,  in the last 168 hours CBC:  Recent Labs Lab 02/09/14 1250 02/09/14 1317 02/10/14 0412  WBC 14.9*  --  9.7  HGB 11.7* 13.3 10.5*  HCT 36.6 39.0 32.6*  MCV 77.4*  --  79.5  PLT 351  --  319   Cardiac Enzymes: No results found for this basename: CKTOTAL, CKMB, CKMBINDEX, TROPONINI,  in the last 168 hours BNP (last 3 results)  Recent Labs  02/25/13 1108  PROBNP 185.0*   CBG: No results found for this basename: GLUCAP,  in the last 168 hours  Recent Results (from the past 240 hour(s))  CULTURE, BLOOD (ROUTINE X 2)     Status: None   Collection Time    02/09/14  2:10 PM      Result Value Ref Range Status   Specimen Description BLOOD RIGHT HAND  Final   Special Requests BOTTLES DRAWN AEROBIC ONLY 5 CC   Final   Culture  Setup Time     Final   Value: 02/09/2014 23:05     Performed at [facility]   Culture     Final   Value:        BLOOD CULTURE RECEIVED NO GROWTH TO DATE CULTURE WILL BE HELD FOR 5 DAYS BEFORE ISSUING A FINAL NEGATIVE REPORT     Performed at [facility]   Report Status PENDING   Incomplete  CULTURE, BLOOD (ROUTINE X 2)     Status: None   Collection Time    02/09/14  2:10 PM      Result Value Ref Range Status   Specimen Description BLOOD LEFT HAND   Final   Special Requests BOTTLES DRAWN AEROBIC AND ANAEROBIC 6 CC EACH   Final   Culture  Setup Time     Final   Value: 02/09/2014 23:05     Performed at [facility]   Culture     Final   Value:        BLOOD CULTURE RECEIVED NO GROWTH TO DATE CULTURE WILL BE HELD FOR 5 DAYS BEFORE  ISSUING A FINAL NEGATIVE REPORT     Performed at [facility]   Report Status PENDING   Incomplete     Studies: Dg Chest 2 View  02/09/2014   CLINICAL DATA:  Chronic cough for 3 months pneumonia, chest pain due to cough, weakness, history asthma, esophageal cancer  EXAM: CHEST  2 VIEW  COMPARISON:  01/22/2014  FINDINGS: Enlargement of cardiac silhouette.  Calcified tortuous aorta.  Pulmonary vascular congestion.  Emphysematous and bronchitic changes consistent with COPD.  Chronic atelectasis versus infiltrate LEFT lower lobe.  Remaining lungs grossly clear.  No pleural effusion or pneumothorax.  Diffuse osseous demineralization.  Post esophageal resection and gastric pull-up with duodenal stent placement.  IMPRESSION: Enlargement of cardiac silhouette with pulmonary vascular congestion.  COPD changes with chronic atelectasis versus infiltrate LEFT lower lobe.  Gastric pull-up with stable appearance of duodenal stent.  No acute abnormalities.   Electronically Signed   By: Lavonia Dana M.D.   On: 02/09/2014 12:57    Scheduled Meds: . amiodarone  200 mg Oral Daily  . ampicillin-sulbactam (UNASYN) IV  1.5 g Intravenous Q6H  . budesonide  0.25 mg Nebulization BID  . heparin  5,000 Units Subcutaneous 3 times per day  . ipratropium-albuterol  3 mL Nebulization Q6H  . metoCLOPramide  5 mg Oral BID  . multivitamin with minerals  1 tablet Oral Daily  . olopatadine  1 drop Both Eyes BID  . pantoprazole  40 mg Oral BID  . potassium chloride  40 mEq Oral Once  . potassium chloride  40 mEq Oral Daily  . sodium chloride  3 mL Intravenous Q12H  . sucralfate  1 g Oral BID AC  . [START ON 02/14/2014] Vitamin D (Ergocalciferol)  50,000 Units Oral Q Fri  . zolpidem  5 mg Oral QHS   Continuous Infusions:    Charlynne Cousins  [facility] Pager [phone]. If 8PM-8AM, please contact night-coverage at [email or web address], password [facility] 02/11/2014, 10:23 AM  LOS: 2 days      **Disclaimer: This  note may have been dictated with voice recognition software. Similar sounding words can inadvertently be transcribed and this note may contain transcription errors which may not have been corrected upon publication of note.**

## 2014-02-11 NOTE — Progress Notes (Signed)
The patient stated that she was feeling "pretty good this morning", but states that she was still "a little weak when standing".  She did not have any acute changes overnight and her VS are stable.  She received Tussionex x1 overnight with relief and did not have any complaints of pain.

## 2014-02-12 ENCOUNTER — Telehealth: Payer: Self-pay | Admitting: Internal Medicine

## 2014-02-12 DIAGNOSIS — K311 Adult hypertrophic pyloric stenosis: Secondary | ICD-10-CM

## 2014-02-12 LAB — BASIC METABOLIC PANEL
ANION GAP: 15 (ref 5–15)
BUN: 7 mg/dL (ref 6–23)
CALCIUM: 9.3 mg/dL (ref 8.4–10.5)
CO2: 21 meq/L (ref 19–32)
Chloride: 101 mEq/L (ref 96–112)
Creatinine, Ser: 0.69 mg/dL (ref 0.50–1.10)
GFR calc Af Amer: >90 mL/min (ref >90)
GFR calc non Af Amer: 79 mL/min — ABNORMAL LOW (ref >90)
Glucose, Bld: 79 mg/dL (ref 70–99)
Potassium: 5.9 mEq/L — ABNORMAL HIGH (ref 3.7–5.3)
SODIUM: 137 meq/L (ref 137–147)

## 2014-02-12 MED ORDER — AMOXICILLIN-POT CLAVULANATE 400-57 MG/5ML PO SUSR: 875.0000 mg | Freq: Two times a day (BID) | ORAL | Status: AC

## 2014-02-12 NOTE — Progress Notes (Signed)
The patient received Tussionex x1 with relief.  She did not receive any other PRN medications and did not have any complaints of pain.  Her VS are stable.

## 2014-02-12 NOTE — Telephone Encounter (Signed)
I called spoke with Patti. She reports she has told them she wants pulm to consult on her. She reports she was told they are taking her off all her breathing tx's and nothing else can be done for her. Was also told she will be in and out of hospital. Every time she aspirates she will get PNA and wants to call palliative care for pt. Pt daughter is upset they have not contacted [place] before making these decisions. Please advise MW thanks

## 2014-02-12 NOTE — Consult Note (Signed)
Consultation  Referring Provider: [facility]      Primary Care Physician:  Donia Ast, FNP Primary Gastroenterologist:  Alben Spittle, MD      Reason for Consultation: cough / aspiration              HPI:   [name] is a 80 y.o. female well known to our practice for her complex GI history. Patient has a history of esophageal cancer, she is s/p remote esophagogogastrectomy / gastric pullup.  She has had aeveral  admissions for recurrent nausea / vomiting / regurgitation / aspiration. Between 2013-2014 we evaluated her for what turned out to be a functional gastric outlet obstruction. She underwent duodenal stent placement. Though nausea, vomiting better patient has continued to have recurrent episodes of aspiration pneumonia. She swallows okay, no dysphagia. No nausea / vomiting but coughs after meals. Coughing eventually leads to colored sputum and diagnosis of aspiration.  In fact, patient was just discharged from here late June, came back to ED with elevated WBC, fevers. She eats in upright position, actually standing up.   Past Medical History  Diagnosis Date  . GERD (gastroesophageal reflux disease)   . Osteoporosis   . Postmenopausal HRT (hormone replacement therapy)   . Anemia   . Allergy   . Asthma   . PONV (postoperative nausea and vomiting)   . Dysrhythmia     a fib  . Chronic bronchitis     "practically q winter" (01/22/2014)  . History of blood transfusion 1993    "related to esophagus removal"  . Arthritis     "legs" (01/06/2014)  . Pneumonia     "all the time; probably 5 times in the last year, counting today" (01/06/2014)  . Aspiration pneumonia 11/22/2013  . H/O hiatal hernia   . Headache(784.0)     "monthly in the past year" (01/22/2014)  . Chronic lower back pain     "maybe cause I fell in the tub on 01/05/2014"  . Urinary frequency   . Esophageal cancer     removed esophagus stomach pulled up; "encapsulated; didn't have to have  radiation"  . Basal cell carcinoma of face     "several; freezes them off; face"    Past Surgical History  Procedure Laterality Date  . Left oophorectomy  1952  . Esophageal removal of cancer,gastric ppull-thru 1993    . Forearm fracture surgery Right ?1990's  . Esophagogastroduodenoscopy  10/19/2011    Procedure: ESOPHAGOGASTRODUODENOSCOPY (EGD);  Surgeon: Milus Banister, MD;  Location: [facility];  Service: Endoscopy;  Laterality: N/A;  . Balloon dilation  10/19/2011    Procedure: BALLOON DILATION;  Surgeon: Milus Banister, MD;  Location: [facility];  Service: Endoscopy;  Laterality: N/A;  . Esophagogastroduodenoscopy  11/07/2011    Procedure: ESOPHAGOGASTRODUODENOSCOPY (EGD);  Surgeon: Ladene Artist, MD,FACG;  Location: [facility];  Service: Endoscopy;  Laterality: N/A;  . Esophagogastroduodenoscopy  11/22/2011    Procedure: ESOPHAGOGASTRODUODENOSCOPY (EGD);  Surgeon: Inda Castle, MD;  Location: [facility];  Service: Endoscopy;  Laterality: N/A;  . Esophagogastroduodenoscopy  11/24/2011    Procedure: ESOPHAGOGASTRODUODENOSCOPY (EGD);  Surgeon: Inda Castle, MD;  Location: [facility];  Service: Endoscopy;  Laterality: N/A;  with removable duodenal stent (actually using esophageal partially covered 23X15 located in locked supply room  . Duodenal stent placement  11/24/2011    Procedure: DUODENAL STENT PLACEMENT;  Surgeon: Inda Castle, MD;  Location: [facility];  Service: Endoscopy;  Laterality: N/A;  .  Video bronchoscopy Bilateral 01/18/2013    Procedure: VIDEO BRONCHOSCOPY WITHOUT FLUORO;  Surgeon: Tanda Rockers, MD;  Location: [facility];  Service: Cardiopulmonary;  Laterality: Bilateral;  . Esophagogastroduodenoscopy N/A 02/11/2013    Procedure: ESOPHAGOGASTRODUODENOSCOPY (EGD);  Surgeon: Inda Castle, MD;  Location: [facility];  Service: Endoscopy;  Laterality: N/A;  . Duodenal stent placement N/A 02/11/2013    Procedure: DUODENAL STENT PLACEMENT;  Surgeon:  Inda Castle, MD;  Location: [facility];  Service: Endoscopy;  Laterality: N/A;  . Esophagogastroduodenoscopy N/A 09/11/2013    Procedure: ESOPHAGOGASTRODUODENOSCOPY (EGD);  Surgeon: Gatha Mayer, MD;  Location: [facility];  Service: Endoscopy;  Laterality: N/A;  . Cholecystectomy  ~ 1996  . Dilation and curettage of uterus  X 6-7  . Cataract extraction w/ intraocular lens  implant, bilateral Bilateral ~ 2000  . Appendectomy      Family History  Problem Relation Age of Onset  . Cervical cancer Mother   . Heart disease Father     MI  . Coronary artery disease Brother   . Hypotension Sister   . Colon cancer Neg Hx   . Colon polyps Sister   . Pancreatic cancer      1/2 sister  . Coronary artery disease Sister     pacemaker  . Asthma Brother   . Asthma Sister      History  Substance Use Topics  . Smoking status: Never Smoker   . Smokeless tobacco: Never Used  . Alcohol Use: No    Prior to Admission medications   Medication Sig Start Date End Date Taking? Authorizing Provider  acetaminophen (TYLENOL) 500 MG tablet Take 1 tablet (500 mg total) by mouth every 6 (six) hours as needed for headache. 12/02/13  Yes Tammy S Parrett, NP  albuterol (PROVENTIL HFA;VENTOLIN HFA) 108 (90 BASE) MCG/ACT inhaler Inhale 2 puffs into the lungs every 6 (six) hours as needed. For wheezing 12/02/13  Yes Tammy S Parrett, NP  albuterol (PROVENTIL) (2.5 MG/3ML) 0.083% nebulizer solution Take 3 mLs (2.5 mg total) by nebulization every 4 (four) hours as needed for wheezing or shortness of breath. 12/10/13  Yes Tanda Rockers, MD  amiodarone (PACERONE) 200 MG tablet Take 200 mg by mouth daily.   Yes Historical Provider, MD  budesonide (PULMICORT) 0.25 MG/2ML nebulizer solution Take 2 mLs (0.25 mg total) by nebulization 2 (two) times daily. Dx: 493.00 12/02/13  Yes Tammy S Parrett, NP  chlorpheniramine-HYDROcodone (TUSSIONEX) 10-8 MG/5ML LQCR Take 2.5 mLs by mouth 2 (two) times daily as needed for cough.    Yes Historical Provider, MD  ergocalciferol (VITAMIN D2) 50000 UNITS capsule Take 50,000 Units by mouth every Friday.   Yes Historical Provider, MD  Fe Fum-FA-B Cmp-C-Zn-Mg-Mn-Cu (HEMOCYTE PLUS PO) Take 1 capsule by mouth 2 (two) times daily.   Yes Historical Provider, MD  furosemide (LASIX) 40 MG tablet Take 1 tablet (40 mg total) by mouth daily. 10/11/13  Yes Timoteo Gaul, FNP  ipratropium-albuterol (DUONEB) 0.5-2.5 (3) MG/3ML SOLN Take 3 mLs by nebulization 3 (three) times daily.   Yes Historical Provider, MD  metoCLOPramide (REGLAN) 5 MG tablet Take 5 mg by mouth 2 (two) times daily. 1 hour before breakfast and supper   Yes Historical Provider, MD  Multiple Vitamins-Calcium (VIACTIV MULTI-VITAMIN) CHEW Chew 1 each by mouth daily. 12/02/13  Yes Tammy S Parrett, NP  Olopatadine HCl (PATADAY) 0.2 % SOLN Apply 1-2 drops to eye 2 (two) times daily as needed (dry, itchy eyes.).   Yes Historical  Provider, MD  pantoprazole (PROTONIX) 40 MG tablet Take 1 tablet (40 mg total) by mouth 2 (two) times daily. Give once before breakfast and once before bed time. 01/27/14  Yes Bobby Rumpf York, PA-C  promethazine (PHENERGAN) 25 MG suppository Place 25 mg rectally every 6 (six) hours as needed for nausea or vomiting.   Yes Historical Provider, MD  sucralfate (CARAFATE) 1 GM/10ML suspension Take 10 mLs (1 g total) by mouth 2 (two) times daily before a meal. 01/09/14  Yes Delfina Redwood, MD  traMADol (ULTRAM) 50 MG tablet Take 1 tablet (50 mg total) by mouth every 8 (eight) hours as needed for moderate pain. 01/27/14  Yes Marianne L York, PA-C  zolpidem (AMBIEN) 5 MG tablet Take 5 mg by mouth at bedtime.   Yes Historical Provider, MD    Current Facility-Administered Medications  Medication Dose Route Frequency Provider Last Rate Last Dose  . 0.9 %  sodium chloride infusion  250 mL Intravenous PRN Jonetta Osgood, MD      . acetaminophen (TYLENOL) tablet 650 mg  650 mg Oral Q6H PRN Shanker Kristeen Mans, MD         Or  . acetaminophen (TYLENOL) suppository 650 mg  650 mg Rectal Q6H PRN Jonetta Osgood, MD      . amiodarone (PACERONE) tablet 200 mg  200 mg Oral Daily Jonetta Osgood, MD   200 mg at 02/11/14 0931  . amoxicillin-clavulanate (AUGMENTIN) 875-125 MG per tablet 1 tablet  1 tablet Oral Q12H Charlynne Cousins, MD   1 tablet at 02/11/14 2115  . budesonide (PULMICORT) nebulizer solution 0.25 mg  0.25 mg Nebulization BID Jonetta Osgood, MD   0.25 mg at 02/12/14 0757  . chlorpheniramine-HYDROcodone (TUSSIONEX) 10-8 MG/5ML suspension 2.5 mL  2.5 mL Oral BID PRN Jonetta Osgood, MD   2.5 mL at 02/11/14 2116  . heparin injection 5,000 Units  5,000 Units Subcutaneous 3 times per day Jonetta Osgood, MD   5,000 Units at 02/12/14 0536  . ipratropium-albuterol (DUONEB) 0.5-2.5 (3) MG/3ML nebulizer solution 3 mL  3 mL Nebulization TID Charlynne Cousins, MD   3 mL at 02/12/14 0757  . metoCLOPramide (REGLAN) tablet 5 mg  5 mg Oral BID Jonetta Osgood, MD   5 mg at 02/11/14 2115  . multivitamin with minerals tablet 1 tablet  1 tablet Oral Daily Jonetta Osgood, MD   1 tablet at 02/11/14 0932  . olopatadine (PATANOL) 0.1 % ophthalmic solution 1 drop  1 drop Both Eyes BID Jonetta Osgood, MD   1 drop at 02/11/14 2115  . ondansetron (ZOFRAN) tablet 4 mg  4 mg Oral Q6H PRN Shanker Kristeen Mans, MD       Or  . ondansetron (ZOFRAN) injection 4 mg  4 mg Intravenous Q6H PRN Shanker Kristeen Mans, MD      . pantoprazole (PROTONIX) EC tablet 40 mg  40 mg Oral BID Jonetta Osgood, MD   40 mg at 02/12/14 0536  . potassium chloride SA (K-DUR,KLOR-CON) CR tablet 40 mEq  40 mEq Oral Daily Charlynne Cousins, MD   40 mEq at 02/11/14 0932  . sodium chloride 0.9 % injection 3 mL  3 mL Intravenous Q12H Jonetta Osgood, MD   3 mL at 02/11/14 2115  . sodium chloride 0.9 % injection 3 mL  3 mL Intravenous PRN Shanker Kristeen Mans, MD      . sucralfate (CARAFATE) 1 GM/10ML suspension 1 g  1 g Oral BID AC Shanker Kristeen Mans, MD   1 g at 02/12/14 0536  . traMADol (ULTRAM) tablet 50 mg  50 mg Oral Q8H PRN Jonetta Osgood, MD   50 mg at 02/12/14 0735  . [START ON 02/14/2014] Vitamin D (Ergocalciferol) (DRISDOL) capsule 50,000 Units  50,000 Units Oral Q Fri Jonetta Osgood, MD      . zolpidem [facility]) tablet 5 mg  5 mg Oral QHS Jonetta Osgood, MD   5 mg at 02/11/14 2115    Allergies as of 02/09/2014 - Review Complete 02/09/2014  Allergen Reaction Noted  . Ensure [nutritional supplements]  01/26/2014  . Erythromycin ethylsuccinate  09/26/2006  . Prednisone  09/26/2006  . Doxycycline Rash 09/17/2013     Review of Systems:    All systems reviewed and negative except where noted in HPI.    Physical Exam:  Vital signs in last 24 hours: Temp:  [97.9 F (36.6 C)-98.2 F (36.8 C)] 98.1 F (36.7 C) (07/15 0659) Pulse Rate:  [72-97] 72 (07/15 0659) Resp:  [18-20] 19 (07/15 0659) BP: (94-122)/(60-71) 94/60 mmHg (07/15 0659) SpO2:  [92 %-98 %] 93 % (07/15 0757) Weight:  [110 lb 7.2 oz (50.1 kg)] 110 lb 7.2 oz (50.1 kg) (07/15 0659) Last BM Date: 02/11/14 General:   Pleasant white female in NAD Head:  Normocephalic and atraumatic. Eyes:   No icterus.   Conjunctiva pink. Ears:  Normal auditory acuity. Neck:  Supple; no masses felt Lungs:  Respirations even and unlabored. Rhonchi throughout all lung fields.  Heart:  Regular rate and rhythm Abdomen:  Soft, nondistended, nontender. Normal bowel sounds.  Rectal:  Not performed.  Msk:  Symmetrical without gross deformities.  Extremities:  Without edema. Neurologic:  Alert and  oriented x4;  grossly normal neurologically. Skin:  Intact without significant lesions or rashes. Cervical Nodes:  No significant cervical adenopathy. Psych:  Alert and cooperative. Normal affect.  LAB RESULTS:  Recent Labs  02/09/14 1250 02/09/14 1317 02/10/14 0412  WBC 14.9*  --  9.7  HGB 11.7* 13.3 10.5*  HCT 36.6 39.0 32.6*  PLT 351  --  319   BMET  Recent  Labs  02/09/14 1317 02/10/14 0412  NA 135* 138  K 3.1* 3.0*  CL 94* 95*  CO2  --  31  GLUCOSE 100* 94  BUN 12 11  CREATININE 0.90 0.85  CALCIUM  --  8.6      Impression / Plan:   109. 80 year old female with remote history of esophageal cancer, s/p esophagectomy / gastric pullup.   2. History of "functional" proximal SBO for which duodenal stent placed. She has no signs/symptoms of outlet obstruction .   3. Recurrent aspiration. Difficult situation. She does all the right things (eats in upright position, doesn't lay down after meals, eats slowly, ect...). Suspect altered anatomy contributing to reflux / aspiration. Will discuss with Dr. Deatra Ina, her primary GI but not sure we can offer much. Of note, modified barium swallowing study last month negative for penetration /aspiration  Thanks   LOS: 3 days   Tye Savoy  02/12/2014, 9:45 AM

## 2014-02-12 NOTE — Consult Note (Signed)
Reviewed and agree with management. Daymon Hora D. Brittan Butterbaugh, M.D., FACG  

## 2014-02-12 NOTE — Telephone Encounter (Signed)
Discussed limitations of pulmonary medicine given that her problem is aspiration > hospice approp/ f/u here prn

## 2014-02-12 NOTE — Progress Notes (Signed)
PT Cancellation Note  Patient Details Name: ANIJA BRICKNER MRN: [id number] DOB: [birth year]   Cancelled Treatment:    Reason Eval/Treat Not Completed: Plan is for d/c back to SNF today, and is currently ambulating >300' in halls with RN. Will defer further PT treatment to SNF. If for some reason d/c is delayed, will see tomorrow.    Jolyn Lent 02/12/2014, 10:51 AM  Jolyn Lent, PT, DPT Acute Rehabilitation Services Pager: [phone]

## 2014-02-12 NOTE — Discharge Summary (Addendum)
Physician Discharge Summary  [name] CWU:[id number] DOB: [birth year] DOA: 02/09/2014  PCP: Donia Ast, FNP  Admit date: 02/09/2014 Discharge date: 02/12/2014  Time spent: 40 minutes  Recommendations for Outpatient Follow-up:  1. Follow up with PCP in 2 weeks. 2. Patient with recurrent aspiration pna from reflux. Use reflux precautions (raise HOB, No eating 2 hours before bedtime) Patient asks to walk after meals. 3. Palliative to meet with  Family at the facility.   Discharge Diagnoses:  Principal Problem:   Aspiration pneumonia Active Problems:   HYPERTENSION   GERD   HX OF ESOPHAGEAL CANCER   Tremor   Hx of esophagectomy   A-fib   Chronic diastolic CHF (congestive heart failure)   Acute respiratory failure with hypoxia   Discharge Condition: stable  Diet recommendation: soft diet. Patient prefers mashed potatoes and vegetables.   Filed Weights   02/10/14 0425 02/11/14 0524 02/12/14 0659  Weight: 49.306 kg (108 lb 11.2 oz) 49.125 kg (108 lb 4.8 oz) 50.1 kg (110 lb 7.2 oz)    History of present illness:  80 y.o. female with a Past Medical History of esophageal cancer s/p esophagogastrectomy and gastric pullup in 1993,history of anastomotic stenosis requiring dilation in March 2013,hx of Gastric outlet obstruction requiring duodennal stent placement in April 2013 and July 2014,she underwent EGD for recurrent n/v with findings of a mild GEJ stricture and pyloric stenosis.She has had recurrent admissions for aspiration PNA, infact was discharged from this facility on 01/27/14. She apparently just completed a course of antibiotics a few days back. Approximately, 2 days back, patient started developing productive cough with yellow sputum, generalized weakness and subjective fever. She claims that her symptoms worsened dramatically yesterday, and was noted to have a fever of 102 F. She apparently had a CBC done yesterday which showed a leukocytosis of 23 thousand.  She was then referred to the ED for further evaluation and treatment.   Hospital Course:  Acute hypoxic resp with hypoxia due to Aspiration pneumonia  - On admission started on Unasyn de-escalate antibiotics to augmentin  - afebrile, leukocytosis resolved.  - unrealistic expectation about her condition this is the 4th episode of aspiration PNA this year. - Use reflux precautions (raise HOB, No eating 2 hours before bedtime) . - consulted GI no they have no further intervention to offer.  Chronic diastolic CHF (congestive heart failure)  - compensated.  - Held lasix on admission, resume as an outpatinet.   A-fib  - Rate controlled, c/w Amiodarone.  - Not a anticoagulation candidate because of prior hemoptysis.   Bilateral Lower Ext edema  - Not sure what the exact etiology here is, although has hx of Diastolic CHF.  - No JVD.  - doppler of lower ext. Pending.   HYPERTENSION  - bp boderline.  - hold lasix.   GERD  - PPI and reglan.   Procedures:  CXR  Consultations:  GI, Dr. Deatra Ina  Discharge Exam: Filed Vitals:   02/12/14 1100  BP: 123/80  Pulse: 77  Temp: 97.7 F (36.5 C)  Resp: 20    General: A&O x3 Cardiovascular: RRR Respiratory: good air movement CTA B/L  Discharge Instructions You were cared for by a hospitalist during your hospital stay. If you have any questions about your discharge medications or the care you received while you were in the hospital after you are discharged, you can call the unit and asked to speak with the hospitalist on call if the hospitalist that took care of  you is not available. Once you are discharged, your primary care physician will handle any further medical issues. Please note that NO REFILLS for any discharge medications will be authorized once you are discharged, as it is imperative that you return to your primary care physician (or establish a relationship with a primary care physician if you do not have one) for your  aftercare needs so that they can reassess your need for medications and monitor your lab values.      Discharge Instructions   Diet - low sodium heart healthy    Complete by:  As directed      Increase activity slowly    Complete by:  As directed             Medication List         acetaminophen 500 MG tablet  Commonly known as:  TYLENOL  Take 1 tablet (500 mg total) by mouth every 6 (six) hours as needed for headache.     albuterol 108 (90 BASE) MCG/ACT inhaler  Commonly known as:  PROVENTIL HFA;VENTOLIN HFA  Inhale 2 puffs into the lungs every 6 (six) hours as needed. For wheezing     albuterol (2.5 MG/3ML) 0.083% nebulizer solution  Commonly known as:  PROVENTIL  Take 3 mLs (2.5 mg total) by nebulization every 4 (four) hours as needed for wheezing or shortness of breath.     amiodarone 200 MG tablet  Commonly known as:  PACERONE  Take 200 mg by mouth daily.     amoxicillin-clavulanate 400-57 MG/5ML suspension  Commonly known as:  AUGMENTIN  Take 10.9 mLs (872 mg total) by mouth 2 (two) times daily before a meal. Breakfast and Evening meal (Supper)     budesonide 0.25 MG/2ML nebulizer solution  Commonly known as:  PULMICORT  Take 2 mLs (0.25 mg total) by nebulization 2 (two) times daily. Dx: 493.00     chlorpheniramine-HYDROcodone 10-8 MG/5ML Lqcr  Commonly known as:  TUSSIONEX  Take 2.5 mLs by mouth 2 (two) times daily as needed for cough.     ergocalciferol 50000 UNITS capsule  Commonly known as:  VITAMIN D2  Take 50,000 Units by mouth every Friday.     furosemide 40 MG tablet  Commonly known as:  LASIX  Take 1 tablet (40 mg total) by mouth daily.     HEMOCYTE PLUS PO  Take 1 capsule by mouth 2 (two) times daily.     ipratropium-albuterol 0.5-2.5 (3) MG/3ML Soln  Commonly known as:  DUONEB  Take 3 mLs by nebulization 3 (three) times daily.     metoCLOPramide 5 MG tablet  Commonly known as:  REGLAN  Take 5 mg by mouth 2 (two) times daily. 1 hour before  breakfast and supper     pantoprazole 40 MG tablet  Commonly known as:  PROTONIX  Take 1 tablet (40 mg total) by mouth 2 (two) times daily. Give once before breakfast and once before bed time.     PATADAY 0.2 % Soln  Generic drug:  Olopatadine HCl  Apply 1-2 drops to eye 2 (two) times daily as needed (dry, itchy eyes.).     promethazine 25 MG suppository  Commonly known as:  PHENERGAN  Place 25 mg rectally every 6 (six) hours as needed for nausea or vomiting.     sucralfate 1 GM/10ML suspension  Commonly known as:  CARAFATE  Take 10 mLs (1 g total) by mouth 2 (two) times daily before a meal.  traMADol 50 MG tablet  Commonly known as:  ULTRAM  Take 1 tablet (50 mg total) by mouth every 8 (eight) hours as needed for moderate pain.     VIACTIV MULTI-VITAMIN Chew  Chew 1 each by mouth daily.     zolpidem 5 MG tablet  Commonly known as:  AMBIEN  Take 5 mg by mouth at bedtime.       Allergies  Allergen Reactions  . Ensure [Nutritional Supplements]     "gives me diarrhea"  . Erythromycin Ethylsuccinate     Irregular pulse rate  . Prednisone     "makes me hyper"  . Doxycycline Rash   Follow-up Information   Follow up with CAMPBELL, [place], FNP In 3 weeks.   Specialty:  Family Medicine   Contact information:   [place] [address] [phone]        The results of significant diagnostics from this hospitalization (including imaging, microbiology, ancillary and laboratory) are listed below for reference.    Significant Diagnostic Studies: Dg Chest 2 View  02/09/2014   CLINICAL DATA:  Chronic cough for 3 months pneumonia, chest pain due to cough, weakness, history asthma, esophageal cancer  EXAM: CHEST  2 VIEW  COMPARISON:  01/22/2014  FINDINGS: Enlargement of cardiac silhouette.  Calcified tortuous aorta.  Pulmonary vascular congestion.  Emphysematous and bronchitic changes consistent with COPD.  Chronic atelectasis versus infiltrate  LEFT lower lobe.  Remaining lungs grossly clear.  No pleural effusion or pneumothorax.  Diffuse osseous demineralization.  Post esophageal resection and gastric pull-up with duodenal stent placement.  IMPRESSION: Enlargement of cardiac silhouette with pulmonary vascular congestion.  COPD changes with chronic atelectasis versus infiltrate LEFT lower lobe.  Gastric pull-up with stable appearance of duodenal stent.  No acute abnormalities.   Electronically Signed   By: Lavonia Dana M.D.   On: 02/09/2014 12:57   Dg Chest 2 View  01/22/2014   CLINICAL DATA:  Fever  EXAM: CHEST  2 VIEW  COMPARISON:  01/17/2014  FINDINGS: Esophagectomy and gastric pull through. Gastric stent unchanged in position.  Mild left lower lobe atelectasis/ infiltrate is unchanged from the prior study. Mild right lower lobe atelectasis unchanged. No definite pneumonia or effusion. Negative for heart failure.  IMPRESSION: Bibasilar atelectasis unchanged.  No definite pneumonia.   Electronically Signed   By: Franchot Gallo M.D.   On: 01/22/2014 13:49   Dg Chest 2 View  01/17/2014   CLINICAL DATA:  Persistent cough  EXAM: CHEST  2 VIEW  COMPARISON:  01/15/2014  FINDINGS: Cardiomediastinal silhouette is stable. Gastroesophageal stent again noted. No pulmonary edema. There is persistent streaky atelectasis, scarring or infiltrate in left lower lobe silhouetting the left heart border. Stable right PICC line.  IMPRESSION: No pulmonary edema. There is persistent streaky atelectasis, scarring or infiltrate in left lower lobe silhouetting the left heart border. Stable right PICC line.   Electronically Signed   By: Lahoma Crocker M.D.   On: 01/17/2014 12:25   Dg Chest 2 View  01/15/2014   CLINICAL DATA:  Pneumonia.  EXAM: CHEST  2 VIEW  COMPARISON:  January 10, 2014.  FINDINGS: Stable cardiomegaly. Stable left basilar density is noted consistent with postsurgical changes and atelectasis. No pneumothorax is noted. Right-sided PICC line is unchanged in  position distal esophageal stent is again noted. No significant pleural effusion is noted. Increased curvilinear opacity is seen in the right upper lobe which may represent subsegmental atelectasis.  IMPRESSION: Stable left basilar density  is noted consistent with atelectasis and postsurgical changes. New curvilinear density is noted in right upper lobe consistent with subsegmental atelectasis.   Electronically Signed   By: Sabino Dick M.D.   On: 01/15/2014 10:16   Dg Swallowing Func-speech Pathology  01/23/2014   Orbie Pyo Butler, CCC-SLP     01/23/2014  1:02 PM Objective Swallowing Evaluation: Modified Barium Swallowing Study   Patient Details  Name: [name] MRN: [id number] Date of Birth: 1929/02/12  Today's Date: 01/23/2014 Time: 0950-1010 SLP Time Calculation (min): 20 min  Past Medical History:  Past Medical History  Diagnosis Date  . GERD (gastroesophageal reflux disease)   . Osteoporosis   . Postmenopausal HRT (hormone replacement therapy)   . Anemia   . Allergy   . Asthma   . Shortness of breath   . PONV (postoperative nausea and vomiting)   . Dysrhythmia     a fib  . Chronic bronchitis     "practically q winter" (01/22/2014)  . History of blood transfusion 1993    "related to esophagus removal"  . Arthritis     "legs" (01/06/2014)  . Pneumonia     "all the time; probably 5 times in the last year, counting  today" (01/06/2014)  . Aspiration pneumonia 11/22/2013  . H/O hiatal hernia   . Headache(784.0)     "monthly in the past year" (01/22/2014)  . Chronic lower back pain     "maybe cause I fell in the tub on 01/05/2014"  . Urinary frequency   . Esophageal cancer     removed esophagus stomach pulled up; "encapsulated; didn't have  to have radiation"  . Basal cell carcinoma of face     "several; freezes them off; face"   Past Surgical History:  Past Surgical History  Procedure Laterality Date  . Left oophorectomy  1952  . Esophageal removal of cancer,gastric ppull-thru 1993    . Forearm fracture surgery  Right ?1990's  . Esophagogastroduodenoscopy  10/19/2011    Procedure: ESOPHAGOGASTRODUODENOSCOPY (EGD);  Surgeon: Milus Banister, MD;  Location: [facility];  Service: Endoscopy;   Laterality: N/A;  . Balloon dilation  10/19/2011    Procedure: BALLOON DILATION;  Surgeon: Milus Banister, MD;   Location: [facility] ENDOSCOPY;  Service: Endoscopy;  Laterality: N/A;  . Esophagogastroduodenoscopy  11/07/2011    Procedure: ESOPHAGOGASTRODUODENOSCOPY (EGD);  Surgeon: Ladene Artist, MD,FACG;  Location: [facility] ENDOSCOPY;  Service: Endoscopy;   Laterality: N/A;  . Esophagogastroduodenoscopy  11/22/2011    Procedure: ESOPHAGOGASTRODUODENOSCOPY (EGD);  Surgeon: Inda Castle, MD;  Location: [facility];  Service: Endoscopy;   Laterality: N/A;  . Esophagogastroduodenoscopy  11/24/2011    Procedure: ESOPHAGOGASTRODUODENOSCOPY (EGD);  Surgeon: Inda Castle, MD;  Location: [facility];  Service: Endoscopy;   Laterality: N/A;  with removable duodenal stent (actually using  esophageal partially covered 23X15 located in locked supply room  . Duodenal stent placement  11/24/2011    Procedure: DUODENAL STENT PLACEMENT;  Surgeon: Inda Castle,  MD;  Location: [facility]OSCOPY;  Service: Endoscopy;  Laterality:  N/A;  . Video bronchoscopy Bilateral 01/18/2013    Procedure: VIDEO BRONCHOSCOPY WITHOUT FLUORO;  Surgeon: Tanda Rockers, MD;  Location: [facility] ENDOSCOPY;  Service: Cardiopulmonary;   Laterality: Bilateral;  . Esophagogastroduodenoscopy N/A 02/11/2013    Procedure: ESOPHAGOGASTRODUODENOSCOPY (EGD);  Surgeon: Inda Castle, MD;  Location: [facility];  Service: Endoscopy;   Laterality: N/A;  . Duodenal stent placement N/A 02/11/2013  Procedure: DUODENAL STENT PLACEMENT;  Surgeon: Inda Castle,  MD;  Location: [facility];  Service: Endoscopy;  Laterality:  N/A;  . Esophagogastroduodenoscopy N/A 09/11/2013    Procedure: ESOPHAGOGASTRODUODENOSCOPY (EGD);  Surgeon: Gatha Mayer, MD;  Location: [facility];  Service: Endoscopy;    Laterality: N/A;  . Cholecystectomy  ~ 1996  . Dilation and curettage of uterus  X 6-7  . Cataract extraction w/ intraocular lens  implant, bilateral  Bilateral ~ 2000  . Appendectomy     HPI:  [name] is an 80 y.o. female with history of A fib,  HTN, esophageal cancer s/p esophagectomy with gastric pull  through, severe GERD, gastroparesis, recurrent aspiration PNA for  past 6 months; who was found by her neighbor in her garden tub on  01/06/14. Family reported recent cold type symptoms for the past  few weeks with decrease in po intake as well as recent d/c of  coumadin due to hematemesis. CT head without acute changes.  She  was started on IV antibiotics for LLL aspiration PNA and  therapies ordered. Has had neck tilting towards the left side  since her surgery for the esophageal cancer.       Assessment / Plan / Recommendation Clinical Impression  Dysphagia Diagnosis: Mild cervical esophageal phase dysphagia Clinical impression: Pt. exhibited functional oropharyngeal  function similar to findings during [facility] 01/13/14.  Laryngeal  elevation and anterior excursion resulted in adequate epiglottic  delfection and tracheal protection without penetration or  aspiration.  Cricopharyngeus muscle is prominent with residue  throughout the cervical esophagus that decreases with subsequent  sips of thin barium.  Aspiration risk is increased due to  superior location of stomach and likely source of repeated pna's.   SLP reviewed esophageal precautions although she appears very  compliant and aware of the importance of reflux strategies.   Recommend diet upgrade to regular texture (as she is able to  determine textures can tolerate) and thin liquids and strict  reflux precautions.  Will continue to follow x 1.    Treatment Recommendation  Therapy as outlined in treatment plan below    Diet Recommendation Regular;Thin liquid   Liquid Administration via: Cup;Straw Medication Administration: Whole meds with liquid  Supervision: Patient able to self feed;Intermittent supervision  to cue for compensatory strategies Compensations: Slow rate;Follow solids with liquid;Small  sips/bites Postural Changes and/or Swallow Maneuvers: Out of bed for  meals;Seated upright 90 degrees;Upright 30-60 min after meal    Other  Recommendations Recommended Consults: Consider GI  evaluation Oral Care Recommendations: Oral care BID   Follow Up Recommendations  None    Frequency and Duration min 1 x/week  2 weeks   Pertinent Vitals/Pain WDL            Reason for Referral Objectively evaluate swallowing function   Oral Phase Oral Preparation/Oral Phase Oral Phase: WFL   Pharyngeal Phase Pharyngeal Phase Pharyngeal Phase: Within functional limits Pharyngeal - Thin Pharyngeal - Thin Cup: Within functional limits Pharyngeal - Thin Straw: Within functional limits Pharyngeal - Solids Pharyngeal - Puree: Within functional limits Pharyngeal - Regular: Within functional limits  Cervical Esophageal Phase    GO    Cervical Esophageal Phase Cervical Esophageal Phase: Impaired Cervical Esophageal Phase - Thin Thin Cup: Prominent cricopharyngeal segment Thin Straw: Prominent cricopharyngeal segment Cervical Esophageal Phase - Solids Regular: Prominent cricopharyngeal segment         Orbie Pyo Litaker M.Ed [employer] [phone]  01/23/2014  Microbiology: Recent Results (from the past 240 hour(s))  CULTURE, BLOOD (ROUTINE X 2)     Status: None   Collection Time    02/09/14  2:10 PM      Result Value Ref Range Status   Specimen Description BLOOD RIGHT HAND   Final   Special Requests BOTTLES DRAWN AEROBIC ONLY 5 CC   Final   Culture  Setup Time     Final   Value: 02/09/2014 23:05     Performed at [facility]   Culture     Final   Value:        BLOOD CULTURE RECEIVED NO GROWTH TO DATE CULTURE WILL BE HELD FOR 5 DAYS BEFORE ISSUING A FINAL NEGATIVE REPORT     Performed at [facility]   Report Status PENDING   Incomplete  CULTURE,  BLOOD (ROUTINE X 2)     Status: None   Collection Time    02/09/14  2:10 PM      Result Value Ref Range Status   Specimen Description BLOOD LEFT HAND   Final   Special Requests BOTTLES DRAWN AEROBIC AND ANAEROBIC 6 CC EACH   Final   Culture  Setup Time     Final   Value: 02/09/2014 23:05     Performed at [facility]   Culture     Final   Value:        BLOOD CULTURE RECEIVED NO GROWTH TO DATE CULTURE WILL BE HELD FOR 5 DAYS BEFORE ISSUING A FINAL NEGATIVE REPORT     Performed at [facility]   Report Status PENDING   Incomplete     Labs: Basic Metabolic Panel:  Recent Labs Lab 02/09/14 1317 02/10/14 0412  NA 135* 138  K 3.1* 3.0*  CL 94* 95*  CO2  --  31  GLUCOSE 100* 94  BUN 12 11  CREATININE 0.90 0.85  CALCIUM  --  8.6   Liver Function Tests: No results found for this basename: AST, ALT, ALKPHOS, BILITOT, PROT, ALBUMIN,  in the last 168 hours No results found for this basename: LIPASE, AMYLASE,  in the last 168 hours No results found for this basename: AMMONIA,  in the last 168 hours CBC:  Recent Labs Lab 02/09/14 1250 02/09/14 1317 02/10/14 0412  WBC 14.9*  --  9.7  HGB 11.7* 13.3 10.5*  HCT 36.6 39.0 32.6*  MCV 77.4*  --  79.5  PLT 351  --  319   Cardiac Enzymes: No results found for this basename: CKTOTAL, CKMB, CKMBINDEX, TROPONINI,  in the last 168 hours BNP: BNP (last 3 results)  Recent Labs  02/25/13 1108  PROBNP 185.0*   CBG: No results found for this basename: GLUCAP,  in the last 168 hours    Signed:  Charlynne Cousins  [facility] 02/12/2014, 11:40 AM

## 2014-02-12 NOTE — Consult Note (Signed)
Chart reveiwed, pt examined and xrays reviewed.  Full note to follow.  Patient's recent aspiration was due to an acute event of nausea and vomiting.  She's generally able to eat.  She does not have obstructive symptoms ensuring that UGI tract is patent.  She does not need GI studies/intervention at this time.

## 2014-02-12 NOTE — Progress Notes (Signed)
Pt discharged to [facility] via EMS. Report given to [facility].   Eulis Canner, RN

## 2014-02-12 NOTE — Clinical Social Work Psychosocial (Signed)
     Clinical Social Work Department BRIEF PSYCHOSOCIAL ASSESSMENT 02/12/2014  Patient:  [name], [name]     Account Number:  [id number]     [place] date:  02/09/2014  Clinical Social Worker:  Iona Coach  Date/Time:  02/12/2014 04:36 PM  Referred by:  Physician  Date Referred:  02/11/2014 Referred for  Other - See comment   Other Referral:   REturn to SNF   Interview type:  Other - See comment Other interview type:   Patient, daughter and son-in-law    PSYCHOSOCIAL DATA Living Status:  FACILITY Admitted from facility:  [facility] Level of care:  [place] Primary support name:  [name]  ([phone] Primary support relationship to patient:  CHILD, ADULT Degree of support available:   Strong support    CURRENT CONCERNS Current Concerns  Other - See comment   Other Concerns:   Return to SNF    SOCIAL WORK ASSESSMENT / PLAN 80 year old female- resident of [facility].  Ok per MD for d/c back to [facility] today. CSW attempted throughout the day to reach the admissions director at [facility] without sucess; daughter contacted Lenna Sciara- SW at facility and DC summary sent to facility with ok to return today via EMS. Fl2 completed and signed by MD.   Assessment/plan status:  No Further Intervention Required Other assessment/ plan:   Information/referral to community resources:   Palliative Care consult - discussed with patient and daughter [name]. They agree to consult. Notified [place] who will complete referral to [facility] services for follow up.    PATIENTS/FAMILYS RESPONSE TO PLAN OF CARE: Patient is a delightful lady- alert, oriented and extremely pleasant. She states that her goal is to complete rehab at [facility] and return home where she lived alone. Patient states that she has had mulitple issues with aspiration pneumonia due to major stomach surgery but is able to verbalize  coping mechanisms for this. She stated that she got sick after receiving breathing treatment at the nursing center and after she vomited- she realized that she aspirated.  Patient and daughter [name] are agreeable to return to the SNF today per OK of MD. Nursing notified to call report. CSW signing off.

## 2014-02-14 ENCOUNTER — Ambulatory Visit (INDEPENDENT_AMBULATORY_CARE_PROVIDER_SITE_OTHER): Payer: Medicare Other | Admitting: General Practice

## 2014-02-14 ENCOUNTER — Ambulatory Visit: Payer: Medicare Other | Admitting: Nurse Practitioner

## 2014-02-14 ENCOUNTER — Other Ambulatory Visit: Payer: Self-pay | Admitting: Family

## 2014-02-15 LAB — CULTURE, BLOOD (ROUTINE X 2)
Culture: NO GROWTH
Culture: NO GROWTH

## 2014-03-03 ENCOUNTER — Emergency Department (HOSPITAL_COMMUNITY): Payer: Medicare Other

## 2014-03-03 ENCOUNTER — Emergency Department (HOSPITAL_COMMUNITY)
Admission: EM | Admit: 2014-03-03 | Discharge: 2014-03-03 | Disposition: A | Discharge status: Home / Self Care | Payer: Medicare Other | Attending: Emergency Medicine | Admitting: Emergency Medicine

## 2014-03-03 ENCOUNTER — Telehealth: Payer: Self-pay | Admitting: Family

## 2014-03-03 ENCOUNTER — Telehealth: Payer: Self-pay

## 2014-03-03 DIAGNOSIS — Z8501 Personal history of malignant neoplasm of esophagus: Secondary | ICD-10-CM | POA: Diagnosis not present

## 2014-03-03 DIAGNOSIS — Y9389 Activity, other specified: Secondary | ICD-10-CM | POA: Insufficient documentation

## 2014-03-03 DIAGNOSIS — J45909 Unspecified asthma, uncomplicated: Secondary | ICD-10-CM | POA: Insufficient documentation

## 2014-03-03 DIAGNOSIS — M129 Arthropathy, unspecified: Secondary | ICD-10-CM | POA: Diagnosis not present

## 2014-03-03 DIAGNOSIS — I499 Cardiac arrhythmia, unspecified: Secondary | ICD-10-CM | POA: Diagnosis not present

## 2014-03-03 DIAGNOSIS — S199XXA Unspecified injury of neck, initial encounter: Secondary | ICD-10-CM

## 2014-03-03 DIAGNOSIS — W010XXA Fall on same level from slipping, tripping and stumbling without subsequent striking against object, initial encounter: Secondary | ICD-10-CM | POA: Insufficient documentation

## 2014-03-03 DIAGNOSIS — D649 Anemia, unspecified: Secondary | ICD-10-CM | POA: Diagnosis not present

## 2014-03-03 DIAGNOSIS — S0993XA Unspecified injury of face, initial encounter: Secondary | ICD-10-CM | POA: Insufficient documentation

## 2014-03-03 DIAGNOSIS — S51009A Unspecified open wound of unspecified elbow, initial encounter: Secondary | ICD-10-CM | POA: Insufficient documentation

## 2014-03-03 DIAGNOSIS — IMO0002 Reserved for concepts with insufficient information to code with codable children: Secondary | ICD-10-CM | POA: Insufficient documentation

## 2014-03-03 DIAGNOSIS — Z79890 Hormone replacement therapy (postmenopausal): Secondary | ICD-10-CM | POA: Diagnosis not present

## 2014-03-03 DIAGNOSIS — Y92009 Unspecified place in unspecified non-institutional (private) residence as the place of occurrence of the external cause: Secondary | ICD-10-CM | POA: Diagnosis not present

## 2014-03-03 DIAGNOSIS — K219 Gastro-esophageal reflux disease without esophagitis: Secondary | ICD-10-CM | POA: Insufficient documentation

## 2014-03-03 DIAGNOSIS — M810 Age-related osteoporosis without current pathological fracture: Secondary | ICD-10-CM | POA: Insufficient documentation

## 2014-03-03 DIAGNOSIS — Z79899 Other long term (current) drug therapy: Secondary | ICD-10-CM | POA: Diagnosis not present

## 2014-03-03 DIAGNOSIS — S60222A Contusion of left hand, initial encounter: Secondary | ICD-10-CM

## 2014-03-03 DIAGNOSIS — G8929 Other chronic pain: Secondary | ICD-10-CM | POA: Diagnosis not present

## 2014-03-03 DIAGNOSIS — S60229A Contusion of unspecified hand, initial encounter: Secondary | ICD-10-CM | POA: Insufficient documentation

## 2014-03-03 DIAGNOSIS — S0180XA Unspecified open wound of other part of head, initial encounter: Secondary | ICD-10-CM | POA: Diagnosis not present

## 2014-03-03 DIAGNOSIS — Z8701 Personal history of pneumonia (recurrent): Secondary | ICD-10-CM | POA: Insufficient documentation

## 2014-03-03 DIAGNOSIS — Z85828 Personal history of other malignant neoplasm of skin: Secondary | ICD-10-CM | POA: Insufficient documentation

## 2014-03-03 DIAGNOSIS — W19XXXA Unspecified fall, initial encounter: Secondary | ICD-10-CM

## 2014-03-03 DIAGNOSIS — S0181XA Laceration without foreign body of other part of head, initial encounter: Secondary | ICD-10-CM

## 2014-03-03 MED ORDER — HYDROCODONE-ACETAMINOPHEN 5-325 MG PO TABS: 1.0000 | ORAL_TABLET | Freq: Four times a day (QID) | ORAL | Status: DC | PRN

## 2014-03-03 MED ORDER — FENTANYL CITRATE 0.05 MG/ML IJ SOLN
50.0000 ug | Freq: Once | INTRAMUSCULAR | Status: AC
  Administered 2014-03-03: 50 ug via INTRAVENOUS
  Filled 2014-03-03: qty 2

## 2014-03-03 MED ORDER — FENTANYL CITRATE 0.05 MG/ML IJ SOLN
50.0000 ug | Freq: Once | INTRAMUSCULAR | Status: AC
  Administered 2014-03-03: 50 ug via INTRAVENOUS

## 2014-03-03 NOTE — ED Provider Notes (Signed)
  This was a shared visit with a mid-level provided (NP or PA).  Throughout the patient's course I was available for consultation/collaboration.  I saw the ECG (if appropriate), relevant labs and studies - I agree with the interpretation.  On my exam the patient was in no distress.  She had a superficial abrasions/lacerations that did not require repair, pain in multiple areas, and some familial concerns for pneumonia.  Patient remained in no distress throughout her emergency department course, was awake, alert, neurologically intact.  Patient's evaluation is largely reassuring, and she was discharged in stable condition with wound care instructions, precautions.      Carmin Muskrat, MD 03/03/14 [phone]

## 2014-03-03 NOTE — Telephone Encounter (Signed)
Pt fell again last nite and went to ed. Pt's appt must be cancelled for Friday due to padonda's schedule, however they are requesting to come in earlier, sometime this week.no 30 min appt. pls advise .

## 2014-03-03 NOTE — Telephone Encounter (Signed)
Rhonda @ [facility] is requesting resumption of care orders for PT, OT, RN, SW, and HHA. Patient was discharged from skilled nursing and is now home.

## 2014-03-03 NOTE — ED Notes (Signed)
Patient is alert and oriented x3.  She was given DC instructions and follow up visit instructions.  Patient gave verbal understanding. She was DC ambulatory under her own power to home.  V/S stable.  He was not showing any signs of distress on DC 

## 2014-03-03 NOTE — Telephone Encounter (Signed)
Verbal order given per protocol

## 2014-03-03 NOTE — ED Provider Notes (Signed)
CSN: [id number]     Arrival date & time 03/03/14  0055 History   First MD Initiated Contact with Patient 03/03/14 0057     Chief Complaint  Patient presents with  . Fall    (Consider location/radiation/quality/duration/timing/severity/associated sxs/prior Treatment) HPI Comments: Patient is an 80 year old female with a history of asthma, chronic bronchitis, chronic neck pain, chronic lower back pain, and atrial fibrillation currently rate and rhythm controlled who presents to the emergency department after a mechanical fall. Patient states that she got out of bed to ambulate to the bathroom. She states that when she sat on the toilet she was positioned too far to the left, which caused her to slip and fall between the toilet and tub. Patient endorses hitting the left side of her forehead on the tub. She denies loss of consciousness. Patient also with pain and swelling in her left hand as a result of her fall. She denies any decreased range of motion, but does endorse some bruising. Patient states that she was taken off chronic Coumadin 2 months ago after her atrial fibrillation became rhythm controlled. She denies associated vision changes, hearing changes, facial drooping, neck pain, nausea, vomiting, low back pain, numbness/tingling, extremity weakness, and bowel/bladder incontinence. Presents from rehab facility where she is recovering from recent PNA; no recent fevers, mucous progressed to clear in color. No c/o SOB.  Patient is a 80 y.o. female presenting with fall. The history is provided by the patient. No language interpreter was used.  Fall Associated symptoms include arthralgias, joint swelling and myalgias. Pertinent negatives include no fever, neck pain, numbness or weakness.    Past Medical History  Diagnosis Date  . GERD (gastroesophageal reflux disease)   . Osteoporosis   . Postmenopausal HRT (hormone replacement therapy)   . Anemia   . Allergy   . Asthma   . Shortness of breath    . PONV (postoperative nausea and vomiting)   . Dysrhythmia     a fib  . Chronic bronchitis     "practically q winter" (01/22/2014)  . History of blood transfusion 1993    "related to esophagus removal"  . Arthritis     "legs" (01/06/2014)  . Pneumonia     "all the time; probably 5 times in the last year, counting today" (01/06/2014)  . Aspiration pneumonia 11/22/2013  . H/O hiatal hernia   . Headache(784.0)     "monthly in the past year" (01/22/2014)  . Chronic lower back pain     "maybe cause I fell in the tub on 01/05/2014"  . Urinary frequency   . Esophageal cancer     removed esophagus stomach pulled up; "encapsulated; didn't have to have radiation"  . Basal cell carcinoma of face     "several; freezes them off; face"   Past Surgical History  Procedure Laterality Date  . Left oophorectomy  1952  . Esophageal removal of cancer,gastric ppull-thru 1993    . Forearm fracture surgery Right ?1990's  . Esophagogastroduodenoscopy  10/19/2011    Procedure: ESOPHAGOGASTRODUODENOSCOPY (EGD);  Surgeon: Milus Banister, MD;  Location: [facility];  Service: Endoscopy;  Laterality: N/A;  . Balloon dilation  10/19/2011    Procedure: BALLOON DILATION;  Surgeon: Milus Banister, MD;  Location: [facility];  Service: Endoscopy;  Laterality: N/A;  . Esophagogastroduodenoscopy  11/07/2011    Procedure: ESOPHAGOGASTRODUODENOSCOPY (EGD);  Surgeon: Ladene Artist, MD,FACG;  Location: [facility] ENDOSCOPY;  Service: Endoscopy;  Laterality: N/A;  . Esophagogastroduodenoscopy  11/22/2011  Procedure: ESOPHAGOGASTRODUODENOSCOPY (EGD);  Surgeon: Inda Castle, MD;  Location: [facility];  Service: Endoscopy;  Laterality: N/A;  . Esophagogastroduodenoscopy  11/24/2011    Procedure: ESOPHAGOGASTRODUODENOSCOPY (EGD);  Surgeon: Inda Castle, MD;  Location: [facility];  Service: Endoscopy;  Laterality: N/A;  with removable duodenal stent (actually using esophageal partially covered 23X15 located in locked supply  room  . Duodenal stent placement  11/24/2011    Procedure: DUODENAL STENT PLACEMENT;  Surgeon: Inda Castle, MD;  Location: [facility]OSCOPY;  Service: Endoscopy;  Laterality: N/A;  . Video bronchoscopy Bilateral 01/18/2013    Procedure: VIDEO BRONCHOSCOPY WITHOUT FLUORO;  Surgeon: Tanda Rockers, MD;  Location: [facility] ENDOSCOPY;  Service: Cardiopulmonary;  Laterality: Bilateral;  . Esophagogastroduodenoscopy N/A 02/11/2013    Procedure: ESOPHAGOGASTRODUODENOSCOPY (EGD);  Surgeon: Inda Castle, MD;  Location: [facility];  Service: Endoscopy;  Laterality: N/A;  . Duodenal stent placement N/A 02/11/2013    Procedure: DUODENAL STENT PLACEMENT;  Surgeon: Inda Castle, MD;  Location: [facility];  Service: Endoscopy;  Laterality: N/A;  . Esophagogastroduodenoscopy N/A 09/11/2013    Procedure: ESOPHAGOGASTRODUODENOSCOPY (EGD);  Surgeon: Gatha Mayer, MD;  Location: [facility];  Service: Endoscopy;  Laterality: N/A;  . Cholecystectomy  ~ 1996  . Dilation and curettage of uterus  X 6-7  . Cataract extraction w/ intraocular lens  implant, bilateral Bilateral ~ 2000  . Appendectomy     Family History  Problem Relation Age of Onset  . Cervical cancer Mother   . Heart disease Father     MI  . Coronary artery disease Brother   . Hypotension Sister   . Colon cancer Neg Hx   . Colon polyps Sister   . Pancreatic cancer      1/2 sister  . Coronary artery disease Sister     pacemaker  . Asthma Brother   . Asthma Sister    History  Substance Use Topics  . Smoking status: Never Smoker   . Smokeless tobacco: Never Used  . Alcohol Use: No   OB History   Grav Para Term Preterm Abortions TAB SAB Ect Mult Living                  Review of Systems  Constitutional: Negative for fever.  Musculoskeletal: Positive for arthralgias, joint swelling and myalgias. Negative for back pain, neck pain and neck stiffness.  Skin: Positive for wound.  Neurological: Negative for weakness and numbness.      Allergies  Ensure; Erythromycin ethylsuccinate; Prednisone; and Doxycycline  Home Medications   Prior to Admission medications   Medication Sig Start Date End Date Taking? Authorizing Provider  acetaminophen (TYLENOL) 500 MG tablet Take 1 tablet (500 mg total) by mouth every 6 (six) hours as needed for headache. 12/02/13   Tammy S Parrett, NP  albuterol (PROVENTIL HFA;VENTOLIN HFA) 108 (90 BASE) MCG/ACT inhaler Inhale 2 puffs into the lungs every 6 (six) hours as needed. For wheezing 12/02/13   Tammy S Parrett, NP  albuterol (PROVENTIL) (2.5 MG/3ML) 0.083% nebulizer solution Take 3 mLs (2.5 mg total) by nebulization every 4 (four) hours as needed for wheezing or shortness of breath. 12/10/13   Tanda Rockers, MD  amiodarone (PACERONE) 200 MG tablet Take 200 mg by mouth daily.    Historical Provider, MD  budesonide (PULMICORT) 0.25 MG/2ML nebulizer solution Take 2 mLs (0.25 mg total) by nebulization 2 (two) times daily. Dx: 493.00 12/02/13   Melvenia Needles, NP  chlorpheniramine-HYDROcodone (TUSSIONEX) 10-8 MG/5ML [facility]  Take 2.5 mLs by mouth 2 (two) times daily as needed for cough.    Historical Provider, MD  ergocalciferol (VITAMIN D2) 50000 UNITS capsule Take 50,000 Units by mouth every Friday.    Historical Provider, MD  Fe Fum-FA-B Cmp-C-Zn-Mg-Mn-Cu (HEMOCYTE PLUS PO) Take 1 capsule by mouth 2 (two) times daily.    Historical Provider, MD  furosemide (LASIX) 20 MG tablet TAKE 1 TABLET BY MOUTH DAILY 02/14/14   Timoteo Gaul, FNP  furosemide (LASIX) 40 MG tablet Take 1 tablet (40 mg total) by mouth daily. 10/11/13   Timoteo Gaul, FNP  HYDROcodone-acetaminophen (NORCO/VICODIN) 5-325 MG per tablet Take 1 tablet by mouth every 6 (six) hours as needed for moderate pain or severe pain. 03/03/14   Antonietta Breach, PA-C  ipratropium-albuterol (DUONEB) 0.5-2.5 (3) MG/3ML SOLN Take 3 mLs by nebulization 3 (three) times daily.    Historical Provider, MD  metoCLOPramide (REGLAN) 5 MG tablet Take 5 mg by  mouth 2 (two) times daily. 1 hour before breakfast and supper    Historical Provider, MD  Multiple Vitamins-Calcium (VIACTIV MULTI-VITAMIN) CHEW Chew 1 each by mouth daily. 12/02/13   Tammy S Parrett, NP  Olopatadine HCl (PATADAY) 0.2 % SOLN Apply 1-2 drops to eye 2 (two) times daily as needed (dry, itchy eyes.).    Historical Provider, MD  pantoprazole (PROTONIX) 40 MG tablet Take 1 tablet (40 mg total) by mouth 2 (two) times daily. Give once before breakfast and once before bed time. 01/27/14   Bobby Rumpf York, PA-C  promethazine (PHENERGAN) 25 MG suppository Place 25 mg rectally every 6 (six) hours as needed for nausea or vomiting.    Historical Provider, MD  sucralfate (CARAFATE) 1 GM/10ML suspension Take 10 mLs (1 g total) by mouth 2 (two) times daily before a meal. 01/09/14   Delfina Redwood, MD  traMADol (ULTRAM) 50 MG tablet Take 1 tablet (50 mg total) by mouth every 8 (eight) hours as needed for moderate pain. 01/27/14   Bobby Rumpf York, PA-C  zolpidem (AMBIEN) 5 MG tablet Take 5 mg by mouth at bedtime.    Historical Provider, MD   BP 113/68  Pulse 73  Temp(Src) 98.2 F (36.8 C) (Oral)  Resp 18  SpO2 91%  Physical Exam  Nursing note and vitals reviewed. Constitutional: She is oriented to person, place, and time. She appears well-developed and well-nourished. No distress.  Nontoxic/nonseptic appearing  HENT:  Head: Normocephalic.  Mouth/Throat: Oropharynx is clear and moist. No oropharyngeal exudate.  Small laceration/puncture wound to lateral aspect of L eyebrow. No skull instability. Oropharynx clear. Smile symmetric.  Eyes: Conjunctivae and EOM are normal. Pupils are equal, round, and reactive to light. No scleral icterus.  Neck: Normal range of motion. Neck supple.  Normal range of motion. No tenderness to palpation of the cervical midline. No bony deformities, step-off, or crepitus.  Cardiovascular: Normal rate, regular rhythm and intact distal pulses.   Distal radial pulse 2+  in LUE.  Pulmonary/Chest: Effort normal. No respiratory distress.  Expiratory rhonchi in L mid lung fields. Chest expansion symmetric. No tachypnea or dyspnea.  Musculoskeletal: Normal range of motion. She exhibits tenderness.  Normal range of motion in vision or soft tissue swelling ecchymosis noted to the dorsal lateral aspect of left hand as well as to left third digit. Tenderness to palpation appreciated to the third through fifth MCP joints of the left hand. No pelvic instability. No leg shortening or malrotation.  Neurological: She is alert and oriented to person,  place, and time. No cranial nerve deficit. She exhibits normal muscle tone. Coordination normal.  GCS 15. Patient speaks in full goal oriented sentences. She moves her extremities without ataxia. No focal neurologic deficits on exam.  Skin: Skin is warm and dry. No rash noted. She is not diaphoretic. No erythema. No pallor.  Psychiatric: She has a normal mood and affect. Her behavior is normal.    ED Course  Procedures (including critical care time) Labs Review Labs Reviewed - No data to display  Imaging Review Dg Chest 2 View  03/03/2014   CLINICAL DATA:  Shortness of breath  EXAM: CHEST  2 VIEW  COMPARISON:  Prior radiograph from 02/09/2014  FINDINGS: Moderate cardiomegaly is stable as compared to prior exam. Sequelae of remote esophagectomy with gastric pull-through again seen, grossly similar.  Mild left basilar atelectasis/scar is present, similar to prior. Lungs are otherwise clear without focal infiltrate, pulmonary edema, or pleural effusion. No pneumothorax.  No acute osseus abnormality.  IMPRESSION: 1. Similar left basilar atelectasis/scarring. No other acute cardiopulmonary abnormality. 2. Stable cardiomegaly. 3. Sequelae of prior esophagectomy with gastric pull-through.   Electronically Signed   By: Jeannine Boga M.D.   On: 03/03/2014 04:04   Ct Head Wo Contrast  03/03/2014   CLINICAL DATA:  Fall.  EXAM: CT HEAD  WITHOUT CONTRAST  TECHNIQUE: Contiguous axial images were obtained from the base of the skull through the vertex without intravenous contrast.  COMPARISON:  01/06/2014  FINDINGS: Skull and Sinuses:There is persistent partial opacification of the right sphenoid sinus. The neighboring mucosa is not thickened and there is no fluid level. No bony destruction. No acute fracture.  Orbits: Bilateral cataract resection.  Brain: No evidence of acute abnormality, such as acute infarction, hemorrhage, hydrocephalus, or mass lesion/mass effect. Age related generalized cerebral volume loss. Stable pattern of moderate chronic vessel disease with patchy ischemic gliosis throughout the deep cerebral white matter.  IMPRESSION: No acute intracranial injury.   Electronically Signed   By: Jorje Guild M.D.   On: 03/03/2014 02:21   Dg Hand Complete Left  03/03/2014   CLINICAL DATA:  Fall with hand pain.  EXAM: LEFT HAND - COMPLETE 3+ VIEW  COMPARISON:  None.  FINDINGS: Soft tissue swelling dorsally at the level of the MCP joints. No suspected fracture. No dislocation.  Generalized osteopenia. Advanced degenerative change at the first [facility] and STT joints.  IMPRESSION: No acute osseous findings.   Electronically Signed   By: Jorje Guild M.D.   On: 03/03/2014 02:33     EKG Interpretation None      MDM   Final diagnoses:  Superficial laceration of face  Hand contusion, left, initial encounter  Fall, initial encounter    80 year old female presents to the emergency department after a fall at home. Falls mechanical. Patient denies loss of consciousness. No focal neurologic deficits appreciated on exam today. Patient noted to have a superficial laceration to the lateral aspect of her left eyebrow. Exam also significant for a left hand contusion with normal range of motion.  CT head unremarkable. No evidence of hemorrhage, hydrocephalus, or midline shift. Hand x-ray negative for acute bony findings. Daughter concerned  about pneumonia for which chest x-ray was ordered. Chest x-ray shows no focal consolidation or pneumonia. There is atelectasis which is stable compared to prior study. Low oxygen saturations likely secondary to history of chronic bronchitis. Patient without hypoxia, tachypnea, or dyspnea. No fever.  Wound dressed in ED with Steri-Strips. Pain controlled with fentanyl. Patient  is stable and appropriate for discharge with instruction to followup with her primary care provider as needed. Return precautions provided and patient and family agreeable to plan with no unaddressed concerns. Patient seen also by my attending, Dr. Vanita Panda, who is in agreement with this workup, assessment, management plan, and patient's stability for discharge.   Filed Vitals:   03/03/14 0057 03/03/14 0355  BP: 134/92 113/68  Pulse: 77 73  Temp: 98 F (36.7 C) 98.2 F (36.8 C)  TempSrc: Oral Oral  Resp: 20 18  SpO2: 92% 91%     Antonietta Breach, PA-C 03/03/14 [id number]

## 2014-03-03 NOTE — ED Notes (Signed)
52mcg of fentanyl given at 04:21 was second 50 mcg of vial removed at 01:22 No waste needed to be documented

## 2014-03-03 NOTE — Telephone Encounter (Signed)
Please put 2 5min slots together for Wednesday

## 2014-03-03 NOTE — Telephone Encounter (Signed)
appt scheduled wed °

## 2014-03-03 NOTE — Discharge Instructions (Signed)
Wound Care Wound care helps prevent pain and infection.  You may need a tetanus shot if:  You cannot remember when you had your last tetanus shot.  You have never had a tetanus shot.  The injury broke your skin. If you need a tetanus shot and you choose not to have one, you may get tetanus. Sickness from tetanus can be serious. HOME CARE   Only take medicine as told by your doctor.  Clean the wound daily with mild soap and water.  Change any bandages (dressings) as told by your doctor.  Put medicated cream and a bandage on the wound as told by your doctor.  Change the bandage if it gets wet, dirty, or starts to smell.  Take showers. Do not take baths, swim, or do anything that puts your wound under water.  Rest and raise (elevate) the wound until the pain and puffiness (swelling) are better.  Keep all doctor visits as told. GET HELP RIGHT AWAY IF:   Yellowish-white fluid (pus) comes from the wound.  Medicine does not lessen your pain.  There is a red streak going away from the wound.  You have a fever. MAKE SURE YOU:   Understand these instructions.  Will watch your condition.  Will get help right away if you are not doing well or get worse. Document Released: 04/26/2008 Document Revised: 10/10/2011 Document Reviewed: 11/21/2010 [facility] Patient Information 2015 [place], [place]. This information is not intended to replace advice given to you by your health care provider. Make sure you discuss any questions you have with your health care provider.  Contusion A contusion is a deep bruise. Contusions happen when an injury causes bleeding under the skin. Signs of bruising include pain, puffiness (swelling), and discolored skin. The contusion may turn blue, purple, or yellow. HOME CARE   Put ice on the injured area.  Put ice in a plastic bag.  Place a towel between your skin and the bag.  Leave the ice on for 15-20 minutes, 03-04 times a day.  Only take medicine  as told by your doctor.  Rest the injured area.  If possible, raise (elevate) the injured area to lessen puffiness. GET HELP RIGHT AWAY IF:   You have more bruising or puffiness.  You have pain that is getting worse.  Your puffiness or pain is not helped by medicine. MAKE SURE YOU:   Understand these instructions.  Will watch your condition.  Will get help right away if you are not doing well or get worse. Document Released: 01/04/2008 Document Revised: 10/10/2011 Document Reviewed: 05/23/2011 [facility] Patient Information 2015 [place], [place]. This information is not intended to replace advice given to you by your health care provider. Make sure you discuss any questions you have with your health care provider. Fall Prevention and Home Safety Falls cause injuries and can affect all age groups. It is possible to use preventive measures to significantly decrease the likelihood of falls. There are many simple measures which can make your home safer and prevent falls. OUTDOORS  Repair cracks and edges of walkways and driveways.  Remove high doorway thresholds.  Trim shrubbery on the main path into your home.  Have good outside lighting.  Clear walkways of tools, rocks, debris, and clutter.  Check that handrails are not broken and are securely fastened. Both sides of steps should have handrails.  Have leaves, snow, and ice cleared regularly.  Use sand or salt on walkways during winter months.  In the garage, clean up  grease or oil spills. BATHROOM  Install night lights.  Install grab bars by the toilet and in the tub and shower.  Use non-skid mats or decals in the tub or shower.  Place a plastic non-slip stool in the shower to sit on, if needed.  Keep floors dry and clean up all water on the floor immediately.  Remove soap buildup in the tub or shower on a regular basis.  Secure bath mats with non-slip, double-sided rug tape.  Remove throw rugs and tripping  hazards from the floors. BEDROOMS  Install night lights.  Make sure a bedside light is easy to reach.  Do not use oversized bedding.  Keep a telephone by your bedside.  Have a firm chair with side arms to use for getting dressed.  Remove throw rugs and tripping hazards from the floor. KITCHEN  Keep handles on pots and pans turned toward the center of the stove. Use back burners when possible.  Clean up spills quickly and allow time for drying.  Avoid walking on wet floors.  Avoid hot utensils and knives.  Position shelves so they are not too high or low.  Place commonly used objects within easy reach.  If necessary, use a sturdy step stool with a grab bar when reaching.  Keep electrical cables out of the way.  Do not use floor polish or wax that makes floors slippery. If you must use wax, use non-skid floor wax.  Remove throw rugs and tripping hazards from the floor. STAIRWAYS  Never leave objects on stairs.  Place handrails on both sides of stairways and use them. Fix any loose handrails. Make sure handrails on both sides of the stairways are as long as the stairs.  Check carpeting to make sure it is firmly attached along stairs. Make repairs to worn or loose carpet promptly.  Avoid placing throw rugs at the top or bottom of stairways, or properly secure the rug with carpet tape to prevent slippage. Get rid of throw rugs, if possible.  Have an electrician put in a light switch at the top and bottom of the stairs. OTHER FALL PREVENTION TIPS  Wear low-heel or rubber-soled shoes that are supportive and fit well. Wear closed toe shoes.  When using a stepladder, make sure it is fully opened and both spreaders are firmly locked. Do not climb a closed stepladder.  Add color or contrast paint or tape to grab bars and handrails in your home. Place contrasting color strips on first and last steps.  Learn and use mobility aids as needed. Install an electrical emergency  response system.  Turn on lights to avoid dark areas. Replace light bulbs that burn out immediately. Get light switches that glow.  Arrange furniture to create clear pathways. Keep furniture in the same place.  Firmly attach carpet with non-skid or double-sided tape.  Eliminate uneven floor surfaces.  Select a carpet pattern that does not visually hide the edge of steps.  Be aware of all pets. OTHER HOME SAFETY TIPS  Set the water temperature for 120 F (48.8 C).  Keep emergency numbers on or near the telephone.  Keep smoke detectors on every level of the home and near sleeping areas. Document Released: 07/08/2002 Document Revised: 01/17/2012 Document Reviewed: 10/07/2011 [facility] Patient Information 2015 [place], [place]. This information is not intended to replace advice given to you by your health care provider. Make sure you discuss any questions you have with your health care provider.

## 2014-03-03 NOTE — ED Notes (Signed)
EMS called to home.  Found patient in between the toilet and tub.  Patient states that she was going to use the bathroom and  Lost her balance and fell between the tub and toilet.  Patient has a small laceration to the upper left eyebrow.  Bleeding is controlled. Left hand swelling with some bruising.  Currently she rates her pain 7 of 10.

## 2014-03-05 ENCOUNTER — Encounter: Payer: Self-pay | Admitting: Family

## 2014-03-05 ENCOUNTER — Ambulatory Visit (INDEPENDENT_AMBULATORY_CARE_PROVIDER_SITE_OTHER): Payer: Medicare Other | Admitting: Family

## 2014-03-05 VITALS — BP 96/58 | HR 84 | Temp 97.7°F | Ht 60.0 in | Wt 112.0 lb

## 2014-03-05 DIAGNOSIS — S60222D Contusion of left hand, subsequent encounter: Secondary | ICD-10-CM

## 2014-03-05 DIAGNOSIS — I509 Heart failure, unspecified: Secondary | ICD-10-CM

## 2014-03-05 DIAGNOSIS — Z9181 History of falling: Secondary | ICD-10-CM

## 2014-03-05 DIAGNOSIS — I10 Essential (primary) hypertension: Secondary | ICD-10-CM

## 2014-03-05 DIAGNOSIS — S0181XD Laceration without foreign body of other part of head, subsequent encounter: Secondary | ICD-10-CM

## 2014-03-05 DIAGNOSIS — S60229A Contusion of unspecified hand, initial encounter: Secondary | ICD-10-CM

## 2014-03-05 DIAGNOSIS — Z5189 Encounter for other specified aftercare: Secondary | ICD-10-CM

## 2014-03-05 DIAGNOSIS — I5041 Acute combined systolic (congestive) and diastolic (congestive) heart failure: Secondary | ICD-10-CM

## 2014-03-05 DIAGNOSIS — S0180XA Unspecified open wound of other part of head, initial encounter: Secondary | ICD-10-CM

## 2014-03-05 MED ORDER — LEVOFLOXACIN 500 MG PO TABS: 500.0000 mg | ORAL_TABLET | Freq: Every day | ORAL | Status: DC

## 2014-03-05 NOTE — Patient Instructions (Signed)

## 2014-03-05 NOTE — Progress Notes (Signed)
Pre visit review using our clinic review tool, if applicable. No additional management support is needed unless otherwise documented below in the visit note. 

## 2014-03-05 NOTE — Progress Notes (Signed)
Subjective:    Patient ID: [name], female    DOB: [birth year], 80 y.o.   MRN: [id number]  HPI 80 year old white female, nonsmoker, with a history of aspiration pneumonia related to esophageal reflux, congestive heart failure, atrial fibrillation, is in for followup of a fall on 03/03/2014. Patient reports sitting on the toilet and falling over to her left thigh down to the floor sustaining a laceration to the left face contusion to the left elbow and left hand. She went to the emergency department was seen, x-ray, evaluated and discharged. CT and x-rays were all normal. Patient feels much better today. He said several falls over the last 6 months. She lives alone. Has home health that comes in 3 times per week to assist. She is also under the care of palliative care  Reports having a cough with congestion x 1 day with yellow productive phelgm.   Review of Systems  Constitutional: Negative.   HENT: Negative.   Respiratory: Positive for cough. Negative for wheezing.        Congestion  Cardiovascular: Negative.   Gastrointestinal: Negative.   Endocrine: Negative.   Genitourinary: Negative.   Musculoskeletal: Negative for arthralgias.       Muscle weakness to the lower extremities.   Skin: Positive for color change.       Contusion left hand and elbow. Laceration with steri-strip to left face.   Allergic/Immunologic: Negative.   Neurological: Negative.   Psychiatric/Behavioral: Negative.    Past Medical History  Diagnosis Date  . GERD (gastroesophageal reflux disease)   . Osteoporosis   . Postmenopausal HRT (hormone replacement therapy)   . Anemia   . Allergy   . Asthma   . Shortness of breath   . PONV (postoperative nausea and vomiting)   . Dysrhythmia     a fib  . Chronic bronchitis     "practically q winter" (01/22/2014)  . History of blood transfusion 1993    "related to esophagus removal"  . Arthritis     "legs" (01/06/2014)  . Pneumonia     "all the time;  probably 5 times in the last year, counting today" (01/06/2014)  . Aspiration pneumonia 11/22/2013  . H/O hiatal hernia   . Headache(784.0)     "monthly in the past year" (01/22/2014)  . Chronic lower back pain     "maybe cause I fell in the tub on 01/05/2014"  . Urinary frequency   . Esophageal cancer     removed esophagus stomach pulled up; "encapsulated; didn't have to have radiation"  . Basal cell carcinoma of face     "several; freezes them off; face"    History   Social History  . Marital Status: Single    Spouse Name: N/A    Number of Children: 1  . Years of Education: N/A   Occupational History  . Retired    Social History Main Topics  . Smoking status: Never Smoker   . Smokeless tobacco: Never Used  . Alcohol Use: No  . Drug Use: No  . Sexual Activity: No   Other Topics Concern  . Not on file   Social History Narrative   DAILY CAFFEINE    USE    Past Surgical History  Procedure Laterality Date  . Left oophorectomy  1952  . Esophageal removal of cancer,gastric ppull-thru 1993    . Forearm fracture surgery Right ?1990's  . Esophagogastroduodenoscopy  10/19/2011    Procedure: ESOPHAGOGASTRODUODENOSCOPY (EGD);  Surgeon: Milus Banister,  MD;  Location: [facility] ENDOSCOPY;  Service: Endoscopy;  Laterality: N/A;  . Balloon dilation  10/19/2011    Procedure: BALLOON DILATION;  Surgeon: Milus Banister, MD;  Location: [facility]OS[facility];  Service: Endoscopy;  Laterality: N/A;  . Esophagogastroduodenoscopy  11/07/2011    Procedure: ESOPHAGOGASTRODUODENOSCOPY (EGD);  Surgeon: Ladene Artist, MD,FACG;  Location: [facility] ENDOSCOPY;  Service: Endoscopy;  Laterality: N/A;  . Esophagogastroduodenoscopy  11/22/2011    Procedure: ESOPHAGOGASTRODUODENOSCOPY (EGD);  Surgeon: Inda Castle, MD;  Location: [facility];  Service: Endoscopy;  Laterality: N/A;  . Esophagogastroduodenoscopy  11/24/2011    Procedure: ESOPHAGOGASTRODUODENOSCOPY (EGD);  Surgeon: Inda Castle, MD;  Location: [facility];   Service: Endoscopy;  Laterality: N/A;  with removable duodenal stent (actually using esophageal partially covered 23X15 located in locked supply room  . Duodenal stent placement  11/24/2011    Procedure: DUODENAL STENT PLACEMENT;  Surgeon: Inda Castle, MD;  Location: [facility];  Service: Endoscopy;  Laterality: N/A;  . Video bronchoscopy Bilateral 01/18/2013    Procedure: VIDEO BRONCHOSCOPY WITHOUT FLUORO;  Surgeon: Tanda Rockers, MD;  Location: [facility] ENDOSCOPY;  Service: Cardiopulmonary;  Laterality: Bilateral;  . Esophagogastroduodenoscopy N/A 02/11/2013    Procedure: ESOPHAGOGASTRODUODENOSCOPY (EGD);  Surgeon: Inda Castle, MD;  Location: [facility];  Service: Endoscopy;  Laterality: N/A;  . Duodenal stent placement N/A 02/11/2013    Procedure: DUODENAL STENT PLACEMENT;  Surgeon: Inda Castle, MD;  Location: [facility];  Service: Endoscopy;  Laterality: N/A;  . Esophagogastroduodenoscopy N/A 09/11/2013    Procedure: ESOPHAGOGASTRODUODENOSCOPY (EGD);  Surgeon: Gatha Mayer, MD;  Location: [facility];  Service: Endoscopy;  Laterality: N/A;  . Cholecystectomy  ~ 1996  . Dilation and curettage of uterus  X 6-7  . Cataract extraction w/ intraocular lens  implant, bilateral Bilateral ~ 2000  . Appendectomy      Family History  Problem Relation Age of Onset  . Cervical cancer Mother   . Heart disease Father     MI  . Coronary artery disease Brother   . Hypotension Sister   . Colon cancer Neg Hx   . Colon polyps Sister   . Pancreatic cancer      1/2 sister  . Coronary artery disease Sister     pacemaker  . Asthma Brother   . Asthma Sister     Allergies  Allergen Reactions  . Ensure [Nutritional Supplements]     "gives me diarrhea"  . Erythromycin Ethylsuccinate     Irregular pulse rate  . Prednisone     "makes me hyper"  . Doxycycline Rash    Current Outpatient Prescriptions on File Prior to Visit  Medication Sig Dispense Refill  . acetaminophen (TYLENOL) 500  MG tablet Take 1 tablet (500 mg total) by mouth every 6 (six) hours as needed for headache.  30 tablet    . albuterol (PROVENTIL HFA;VENTOLIN HFA) 108 (90 BASE) MCG/ACT inhaler Inhale 2 puffs into the lungs every 6 (six) hours as needed. For wheezing  1 Inhaler  4  . albuterol (PROVENTIL) (2.5 MG/3ML) 0.083% nebulizer solution Take 3 mLs (2.5 mg total) by nebulization every 4 (four) hours as needed for wheezing or shortness of breath.  75 mL  12  . amiodarone (PACERONE) 200 MG tablet Take 200 mg by mouth daily.      . budesonide (PULMICORT) 0.25 MG/2ML nebulizer solution Take 2 mLs (0.25 mg total) by nebulization 2 (two) times daily. Dx: 493.00  60 mL    . chlorpheniramine-HYDROcodone ([place])  10-8 MG/5ML LQCR Take 2.5 mLs by mouth 2 (two) times daily as needed for cough.      . ergocalciferol (VITAMIN D2) 50000 UNITS capsule Take 50,000 Units by mouth every Friday.      . Fe Fum-FA-B Cmp-C-Zn-Mg-Mn-Cu (HEMOCYTE PLUS PO) Take 1 capsule by mouth 2 (two) times daily.      . furosemide (LASIX) 20 MG tablet TAKE 1 TABLET BY MOUTH DAILY  30 tablet  2  . furosemide (LASIX) 40 MG tablet Take 1 tablet (40 mg total) by mouth daily.  30 tablet  0  . HYDROcodone-acetaminophen (NORCO/VICODIN) 5-325 MG per tablet Take 1 tablet by mouth every 6 (six) hours as needed for moderate pain or severe pain.  9 tablet  0  . ipratropium-albuterol (DUONEB) 0.5-2.5 (3) MG/3ML SOLN Take 3 mLs by nebulization 3 (three) times daily.      . metoCLOPramide (REGLAN) 5 MG tablet Take 5 mg by mouth 2 (two) times daily. 1 hour before breakfast and supper      . Multiple Vitamins-Calcium (VIACTIV MULTI-VITAMIN) CHEW Chew 1 each by mouth daily.    0  . Olopatadine HCl (PATADAY) 0.2 % SOLN Apply 1-2 drops to eye 2 (two) times daily as needed (dry, itchy eyes.).      Marland Kitchen pantoprazole (PROTONIX) 40 MG tablet Take 1 tablet (40 mg total) by mouth 2 (two) times daily. Give once before breakfast and once before bed time.      . promethazine  (PHENERGAN) 25 MG suppository Place 25 mg rectally every 6 (six) hours as needed for nausea or vomiting.      . sucralfate (CARAFATE) 1 GM/10ML suspension Take 10 mLs (1 g total) by mouth 2 (two) times daily before a meal.  420 mL  0  . traMADol (ULTRAM) 50 MG tablet Take 1 tablet (50 mg total) by mouth every 8 (eight) hours as needed for moderate pain.  30 tablet  0  . zolpidem (AMBIEN) 5 MG tablet Take 5 mg by mouth at bedtime.       No current facility-administered medications on file prior to visit.    BP 96/58  Pulse 84  Temp(Src) 97.7 F (36.5 C) (Oral)  Ht 5' (1.524 m)  Wt 112 lb (50.803 kg)  BMI 21.87 kg/m2  SpO2 92%chart     Objective:   Physical Exam  Constitutional: She is oriented to person, place, and time. She appears well-developed and well-nourished.  HENT:  Right Ear: External ear normal.  Left Ear: External ear normal.  Nose: Nose normal.  Mouth/Throat: Oropharynx is clear and moist.  Neck: Normal range of motion. Neck supple.  Cardiovascular: Normal rate, regular rhythm and normal heart sounds.   Pulmonary/Chest: Effort normal and breath sounds normal.  Musculoskeletal: She exhibits tenderness.  Left hand, tender to palpation with ecchymosis.   Neurological: She is alert and oriented to person, place, and time.  Skin: Skin is warm and dry.  Psychiatric: She has a normal mood and affect.          Assessment & Plan:  Jennea was seen today for er follow up/fall.  Diagnoses and associated orders for this visit:  At high risk for falls  Unspecified essential hypertension  Acute combined systolic and diastolic congestive heart failure  Laceration of face, subsequent encounter  Hand contusion, left, subsequent encounter  Other Orders - levofloxacin (LEVAQUIN) 500 MG tablet; Take 1 tablet (500 mg total) by mouth daily.    I have advised that she is  at significant fall risk. Consider assisted living placement. Patient declined.

## 2014-03-06 ENCOUNTER — Telehealth: Payer: Self-pay | Admitting: Family

## 2014-03-06 NOTE — Telephone Encounter (Signed)
Relevant patient education assigned to patient using Emmi. ° °

## 2014-03-07 ENCOUNTER — Ambulatory Visit: Payer: Medicare Other | Admitting: Family

## 2014-03-17 ENCOUNTER — Telehealth: Payer: Self-pay | Admitting: Family

## 2014-03-17 NOTE — Telephone Encounter (Signed)
Dentist usually prescribed if necessary.

## 2014-03-17 NOTE — Telephone Encounter (Signed)
Attempted to reach [place] by phone. Husband states that she is already at the dentist with pt. No answer when cell phone was called

## 2014-03-17 NOTE — Telephone Encounter (Signed)
Caller: Judy/Other; Phone: [phone]; Reason for Call: Patient has dental appointment for extraction this morning 8/17.  Hx of Atrial fib.  Asking if she needs to be premedicated prior to dental surgery.   Would be able to cancel appointment if necessary but needs to know since is to pick her up in about an hour.   Please review and contact neice/Judy about need for pre-medication.   Can reach [name] at home [phone].

## 2014-03-17 NOTE — Telephone Encounter (Signed)
No antibiotic needed

## 2014-03-18 ENCOUNTER — Telehealth: Payer: Self-pay | Admitting: Family

## 2014-03-18 NOTE — Telephone Encounter (Signed)
Left message to advise pt's niece, [name] that Lasix 20 mg was Rx'd by Padonda on 02/14/2014 with 2 refills, therefore she should be able to obtain a refill from the pharmacy. The Lasix 40mg  was Rx'd by Dr. Arnoldo Morale in March. Advised that pt should not be taking both

## 2014-03-18 NOTE — Telephone Encounter (Signed)
Pt is needing new rx furosemide (LASIX) 40 MG tablet, send to [facility] [place]. Pt will pick up tomorrow

## 2014-03-19 ENCOUNTER — Telehealth: Payer: Self-pay | Admitting: Family

## 2014-03-19 NOTE — Telephone Encounter (Signed)
[facility] is requesting a verbal order to recertify the pt, in order for them to continue seeing the pt.

## 2014-03-19 NOTE — Telephone Encounter (Signed)
[name] would like a verbal order to continue physical therapy, 2 times per week for 4 weeks.

## 2014-03-20 NOTE — Telephone Encounter (Signed)
Ok to continue seeing pt, per [name] is aware

## 2014-03-20 NOTE — Telephone Encounter (Signed)
Ok to continue PT, per [place].  Left message to advise Cecilie Lowers

## 2014-03-21 ENCOUNTER — Telehealth: Payer: Self-pay | Admitting: Family

## 2014-03-21 NOTE — Telephone Encounter (Signed)
Fyi: level 1 medication interaction. (1) promethazine 25 mg and (2) metoclopramide 5 mg. Cecilie Lowers would like a callback advising him what to do.

## 2014-03-21 NOTE — Telephone Encounter (Signed)
Cecilie Lowers aware, per Abby Potash, pt should continue medication

## 2014-03-21 NOTE — Telephone Encounter (Signed)
error 

## 2014-04-01 ENCOUNTER — Encounter: Payer: Self-pay | Admitting: Family

## 2014-04-01 ENCOUNTER — Ambulatory Visit (INDEPENDENT_AMBULATORY_CARE_PROVIDER_SITE_OTHER): Payer: Medicare Other | Admitting: Family

## 2014-04-01 ENCOUNTER — Telehealth: Payer: Self-pay | Admitting: Family

## 2014-04-01 VITALS — BP 100/60 | HR 71 | Ht 60.0 in | Wt 111.2 lb

## 2014-04-01 DIAGNOSIS — J4541 Moderate persistent asthma with (acute) exacerbation: Secondary | ICD-10-CM

## 2014-04-01 DIAGNOSIS — G4700 Insomnia, unspecified: Secondary | ICD-10-CM

## 2014-04-01 DIAGNOSIS — I482 Chronic atrial fibrillation, unspecified: Secondary | ICD-10-CM

## 2014-04-01 DIAGNOSIS — I5023 Acute on chronic systolic (congestive) heart failure: Secondary | ICD-10-CM

## 2014-04-01 DIAGNOSIS — I509 Heart failure, unspecified: Secondary | ICD-10-CM

## 2014-04-01 DIAGNOSIS — I4891 Unspecified atrial fibrillation: Secondary | ICD-10-CM

## 2014-04-01 DIAGNOSIS — J45901 Unspecified asthma with (acute) exacerbation: Secondary | ICD-10-CM

## 2014-04-01 MED ORDER — METHYLPREDNISOLONE 4 MG PO KIT: PACK | ORAL | Status: AC

## 2014-04-01 MED ORDER — ONDANSETRON HCL 4 MG PO TABS: 4.0000 mg | ORAL_TABLET | Freq: Every day | ORAL | Status: DC

## 2014-04-01 MED ORDER — TRAMADOL HCL 50 MG PO TABS: 50.0000 mg | ORAL_TABLET | Freq: Three times a day (TID) | ORAL | Status: DC | PRN

## 2014-04-01 MED ORDER — FUROSEMIDE 40 MG PO TABS: 40.0000 mg | ORAL_TABLET | Freq: Every day | ORAL | Status: DC

## 2014-04-01 MED ORDER — ERGOCALCIFEROL 1.25 MG (50000 UT) PO CAPS: 50000.0000 [IU] | ORAL_CAPSULE | ORAL | Status: DC

## 2014-04-01 NOTE — Patient Instructions (Signed)
Sprain °A sprain is a tear in one of the strong, fibrous tissues that connect your bones (ligaments). The severity of the sprain depends on how much of the ligament is torn. The tear can be either partial or complete. °CAUSES  °Often, sprains are a result of a fall or an injury. The force of the impact causes the fibers of your ligament to stretch beyond their normal length. This excess tension causes the fibers of your ligament to tear. °SYMPTOMS  °You may have some loss of motion or increased pain within your normal range of motion. Other symptoms include: °· Bruising. °· Tenderness. °· Swelling. °DIAGNOSIS  °In order to diagnose a sprain, your caregiver will physically examine you to determine how torn the ligament is. Your caregiver may also suggest an X-ray exam to make sure no bones are broken. °TREATMENT  °If your ligament is only partially torn, treatment usually involves keeping the injured area in a fixed position (immobilization) for a short period. To do this, your caregiver will apply a bandage, cast, or splint to keep the area from moving until it heals. For a partially torn ligament, the healing process usually takes 2 to 3 weeks. °If your ligament is completely torn, you may need surgery to reconnect the ligament to the bone or to reconstruct the ligament. After surgery, a cast or splint may be applied and will need to stay on for 4 to 6 weeks while your ligament heals. °HOME CARE INSTRUCTIONS °· Keep the injured area elevated to decrease swelling. °· To ease pain and swelling, apply ice to your joint twice a day, for 2 to 3 days. °¨ Put ice in a plastic bag. °¨ Place a towel between your skin and the bag. °¨ Leave the ice on for 15 minutes. °· Only take over-the-counter or prescription medicine for pain as directed by your caregiver. °· Do not leave the injured area unprotected until pain and stiffness go away (usually 3 to 4 weeks). °· Do not allow your cast or splint to get wet. Cover your cast or  splint with a plastic bag when you shower or bathe. Do not swim. °· Your caregiver may suggest exercises for you to do during your recovery to prevent or limit permanent stiffness. °SEEK IMMEDIATE MEDICAL CARE IF: °· Your cast or splint becomes damaged. °· Your pain becomes worse. °MAKE SURE YOU: °· Understand these instructions. °· Will watch your condition. °· Will get help right away if you are not doing well or get worse. °Document Released: 07/15/2000 Document Revised: 10/10/2011 Document Reviewed: 07/30/2011 °ExitCare® Patient Information ©2015 [employer]. This information is not intended to replace advice given to you by your health care provider. Make sure you discuss any questions you have with your health care provider. ° °

## 2014-04-01 NOTE — Progress Notes (Signed)
Pre visit review using our clinic review tool, if applicable. No additional management support is needed unless otherwise documented below in the visit note. 

## 2014-04-01 NOTE — Progress Notes (Signed)
Subjective:    Patient ID: [name], female    DOB: Dec 27, 1928, 80 y.o.   MRN: [id number]  HPI 80 year old white female, nonsmoker with history of congestive heart failure, asthma, hypertension, GERD, aortic valve disorders in today for recheck. She's doing well. Continues to have a cough and this was finished up a 2 week supply of amoxicillin. She is currently not on any anticoagulation therapy due to her risk for falls. She continues to have swelling in her left hand related to a fall last month. X-ray was normal. Has been using ice intermittently that helps. Has a CNA that comes out 3 times per week to assist her with ADLs.   Review of Systems  Constitutional: Negative.   HENT: Negative.   Respiratory: Positive for cough. Negative for shortness of breath and wheezing.   Cardiovascular: Negative.  Negative for chest pain, palpitations and leg swelling.  Gastrointestinal: Negative.   Endocrine: Negative.   Genitourinary: Negative.   Skin: Negative.   Allergic/Immunologic: Negative.   Neurological: Negative.   Hematological: Negative.   Psychiatric/Behavioral: Negative.    Past Medical History  Diagnosis Date  . GERD (gastroesophageal reflux disease)   . Osteoporosis   . Postmenopausal HRT (hormone replacement therapy)   . Anemia   . Allergy   . Asthma   . Shortness of breath   . PONV (postoperative nausea and vomiting)   . Dysrhythmia     a fib  . Chronic bronchitis     "practically q winter" (01/22/2014)  . History of blood transfusion 1993    "related to esophagus removal"  . Arthritis     "legs" (01/06/2014)  . Pneumonia     "all the time; probably 5 times in the last year, counting today" (01/06/2014)  . Aspiration pneumonia 11/22/2013  . H/O hiatal hernia   . Headache(784.0)     "monthly in the past year" (01/22/2014)  . Chronic lower back pain     "maybe cause I fell in the tub on 01/05/2014"  . Urinary frequency   . Esophageal cancer     removed esophagus  stomach pulled up; "encapsulated; didn't have to have radiation"  . Basal cell carcinoma of face     "several; freezes them off; face"    History   Social History  . Marital Status: Single    Spouse Name: N/A    Number of Children: 1  . Years of Education: N/A   Occupational History  . Retired    Social History Main Topics  . Smoking status: Never Smoker   . Smokeless tobacco: Never Used  . Alcohol Use: No  . Drug Use: No  . Sexual Activity: No   Other Topics Concern  . Not on file   Social History Narrative   DAILY CAFFEINE    USE    Past Surgical History  Procedure Laterality Date  . Left oophorectomy  1952  . Esophageal removal of cancer,gastric ppull-thru 1993    . Forearm fracture surgery Right ?1990's  . Esophagogastroduodenoscopy  10/19/2011    Procedure: ESOPHAGOGASTRODUODENOSCOPY (EGD);  Surgeon: Milus Banister, MD;  Location: [facility];  Service: Endoscopy;  Laterality: N/A;  . Balloon dilation  10/19/2011    Procedure: BALLOON DILATION;  Surgeon: Milus Banister, MD;  Location: [facility];  Service: Endoscopy;  Laterality: N/A;  . Esophagogastroduodenoscopy  11/07/2011    Procedure: ESOPHAGOGASTRODUODENOSCOPY (EGD);  Surgeon: Ladene Artist, MD,FACG;  Location: [facility] ENDOSCOPY;  Service: Endoscopy;  Laterality: N/A;  .  Esophagogastroduodenoscopy  11/22/2011    Procedure: ESOPHAGOGASTRODUODENOSCOPY (EGD);  Surgeon: Inda Castle, MD;  Location: [facility];  Service: Endoscopy;  Laterality: N/A;  . Esophagogastroduodenoscopy  11/24/2011    Procedure: ESOPHAGOGASTRODUODENOSCOPY (EGD);  Surgeon: Inda Castle, MD;  Location: [facility];  Service: Endoscopy;  Laterality: N/A;  with removable duodenal stent (actually using esophageal partially covered 23X15 located in locked supply room  . Duodenal stent placement  11/24/2011    Procedure: DUODENAL STENT PLACEMENT;  Surgeon: Inda Castle, MD;  Location: [facility];  Service: Endoscopy;  Laterality: N/A;    . Video bronchoscopy Bilateral 01/18/2013    Procedure: VIDEO BRONCHOSCOPY WITHOUT FLUORO;  Surgeon: Tanda Rockers, MD;  Location: [facility];  Service: Cardiopulmonary;  Laterality: Bilateral;  . Esophagogastroduodenoscopy N/A 02/11/2013    Procedure: ESOPHAGOGASTRODUODENOSCOPY (EGD);  Surgeon: Inda Castle, MD;  Location: [facility];  Service: Endoscopy;  Laterality: N/A;  . Duodenal stent placement N/A 02/11/2013    Procedure: DUODENAL STENT PLACEMENT;  Surgeon: Inda Castle, MD;  Location: [facility];  Service: Endoscopy;  Laterality: N/A;  . Esophagogastroduodenoscopy N/A 09/11/2013    Procedure: ESOPHAGOGASTRODUODENOSCOPY (EGD);  Surgeon: Gatha Mayer, MD;  Location: [facility];  Service: Endoscopy;  Laterality: N/A;  . Cholecystectomy  ~ 1996  . Dilation and curettage of uterus  X 6-7  . Cataract extraction w/ intraocular lens  implant, bilateral Bilateral ~ 2000  . Appendectomy      Family History  Problem Relation Age of Onset  . Cervical cancer Mother   . Heart disease Father     MI  . Coronary artery disease Brother   . Hypotension Sister   . Colon cancer Neg Hx   . Colon polyps Sister   . Pancreatic cancer      1/2 sister  . Coronary artery disease Sister     pacemaker  . Asthma Brother   . Asthma Sister     Allergies  Allergen Reactions  . Ensure [Nutritional Supplements]     "gives me diarrhea"  . Erythromycin Ethylsuccinate     Irregular pulse rate  . Prednisone     "makes me hyper"  . Doxycycline Rash    Current Outpatient Prescriptions on File Prior to Visit  Medication Sig Dispense Refill  . acetaminophen (TYLENOL) 500 MG tablet Take 1 tablet (500 mg total) by mouth every 6 (six) hours as needed for headache.  30 tablet    . albuterol (PROVENTIL HFA;VENTOLIN HFA) 108 (90 BASE) MCG/ACT inhaler Inhale 2 puffs into the lungs every 6 (six) hours as needed. For wheezing  1 Inhaler  4  . amiodarone (PACERONE) 200 MG tablet Take 200 mg by mouth  daily.      . budesonide (PULMICORT) 0.25 MG/2ML nebulizer solution Take 2 mLs (0.25 mg total) by nebulization 2 (two) times daily. Dx: 493.00  60 mL    . Fe Fum-FA-B Cmp-C-Zn-Mg-Mn-Cu (HEMOCYTE PLUS PO) Take 1 capsule by mouth 2 (two) times daily.      . metoCLOPramide (REGLAN) 5 MG tablet Take 5 mg by mouth 2 (two) times daily. 1 hour before breakfast and supper      . Multiple Vitamins-Calcium (VIACTIV MULTI-VITAMIN) CHEW Chew 1 each by mouth daily.    0  . Olopatadine HCl (PATADAY) 0.2 % SOLN Apply 1-2 drops to eye 2 (two) times daily as needed (dry, itchy eyes.).      Marland Kitchen pantoprazole (PROTONIX) 40 MG tablet Take 1 tablet (40 mg total) by mouth  2 (two) times daily. Give once before breakfast and once before bed time.      Marland Kitchen zolpidem (AMBIEN) 5 MG tablet Take 5 mg by mouth at bedtime.      . sucralfate (CARAFATE) 1 GM/10ML suspension Take 10 mLs (1 g total) by mouth 2 (two) times daily before a meal.  420 mL  0   No current facility-administered medications on file prior to visit.    BP 100/60  Pulse 71  Ht 5' (1.524 m)  Wt 111 lb 3.2 oz (50.44 kg)  BMI 21.72 kg/m2  SpO2 94%chart    Objective:   Physical Exam  Constitutional: She is oriented to person, place, and time. She appears well-developed and well-nourished.  HENT:  Right Ear: External ear normal.  Left Ear: External ear normal.  Nose: Nose normal.  Mouth/Throat: Oropharynx is clear and moist.  Neck: Normal range of motion. Neck supple.  Cardiovascular: Normal rate, regular rhythm and normal heart sounds.   Pulmonary/Chest: Effort normal. No respiratory distress. She has rales. She exhibits no tenderness.  Abdominal: Soft. Bowel sounds are normal.  Musculoskeletal: Normal range of motion.  Neurological: She is alert and oriented to person, place, and time.  Skin: Skin is warm and dry.  Psychiatric: She has a normal mood and affect.          Assessment & Plan:  Ingra was seen today for follow-up.  Diagnoses and  associated orders for this visit:  CHF (congestive heart failure), NYHA class I, acute on chronic, systolic  Chronic atrial fibrillation  Insomnia  Asthma with acute exacerbation, moderate persistent  Other Orders - traMADol (ULTRAM) 50 MG tablet; Take 1 tablet (50 mg total) by mouth every 8 (eight) hours as needed for moderate pain. - ondansetron (ZOFRAN) 4 MG tablet; Take 1 tablet (4 mg total) by mouth at bedtime. - ergocalciferol (VITAMIN D2) 50000 UNITS capsule; Take 1 capsule (50,000 Units total) by mouth every Friday. - furosemide (LASIX) 40 MG tablet; Take 1 tablet (40 mg total) by mouth daily. - methylPREDNISolone (MEDROL DOSEPAK) 4 MG tablet; follow package directions   Call the office with any questions or concerns. See Dr. Melvyn Novas in 2 weeks. See me in 2 months.

## 2014-04-01 NOTE — Telephone Encounter (Signed)
Patient Information:  Caller Name: [name]  Phone: [phone]  Patient: Olubunmi, Rothenberger  Gender: Female  DOB: [birth year]  Age: 80 Years  PCP: Benay Pillow (Adults only)  Office Follow Up:  Does the office need to follow up with this patient?: No  Instructions For The Office: N/A  RN Note:  Daughter advised to call the Pharmacy and see if the Pharmacist can direct her safely. Advised she could take the two pills at dinner meal, but states she does not eat much for dinner. Pt.is being prescribed these as a precaution to help prevent pneumonia.  Symptoms  Reason For Call & Symptoms: Daughter is calling and asking about the Medrol Dospak. States she is supposed to take two pills at bedtime and she cannot take them because they make her hyper and she will not get any sleep. States if she does not take them, she will have two extra pills at the end.Wants to know what to do with them.  Reviewed Health History In EMR: Yes  Reviewed Medications In EMR: Yes  Reviewed Allergies In EMR: Yes  Reviewed Surgeries / Procedures: Yes  Date of Onset of Symptoms: 04/01/2014  Guideline(s) Used:  No Protocol Available - Information Only  Disposition Per Guideline:   Home Care  Reason For Disposition Reached:   Information only question and nurse able to answer  Advice Given:  Call Back If:  New symptoms develop  You become worse.  Patient Will Follow Care Advice:  YES

## 2014-04-02 NOTE — Telephone Encounter (Signed)
Noted  

## 2014-04-14 ENCOUNTER — Telehealth: Payer: Self-pay | Admitting: Gastroenterology

## 2014-04-14 ENCOUNTER — Encounter: Payer: Self-pay | Admitting: Internal Medicine

## 2014-04-14 ENCOUNTER — Telehealth: Payer: Self-pay

## 2014-04-14 ENCOUNTER — Ambulatory Visit (INDEPENDENT_AMBULATORY_CARE_PROVIDER_SITE_OTHER): Payer: Medicare Other | Admitting: Internal Medicine

## 2014-04-14 VITALS — BP 102/60 | HR 75 | Temp 98.0°F | Ht 60.0 in | Wt 110.0 lb

## 2014-04-14 DIAGNOSIS — J690 Pneumonitis due to inhalation of food and vomit: Secondary | ICD-10-CM

## 2014-04-14 DIAGNOSIS — J45909 Unspecified asthma, uncomplicated: Secondary | ICD-10-CM

## 2014-04-14 DIAGNOSIS — J4530 Mild persistent asthma, uncomplicated: Secondary | ICD-10-CM

## 2014-04-14 MED ORDER — LANSOPRAZOLE 15 MG PO CPDR
30.0000 mg | DELAYED_RELEASE_CAPSULE | Freq: Two times a day (BID) | ORAL | Status: AC
Start: 2014-04-14 — End: ?

## 2014-04-14 MED ORDER — HYDROCOD POLST-CHLORPHEN POLST 10-8 MG/5ML PO LQCR: ORAL | Status: DC

## 2014-04-14 MED ORDER — AMIODARONE HCL 200 MG PO TABS: 200.0000 mg | ORAL_TABLET | Freq: Every day | ORAL | Status: AC

## 2014-04-14 MED ORDER — HEMOCYTE-PLUS 106-1 MG PO TABS: 1.0000 | ORAL_TABLET | Freq: Two times a day (BID) | ORAL | Status: DC

## 2014-04-14 NOTE — Progress Notes (Signed)
Subjective:     Patient ID: [name], female   DOB: 1928-11-29 MRN: [id number]  Brief patient profile:  80 yowf never smoker with bad gerd >  hx of esophageal cancer w/ prev. esophagectomy with gastric pull- through by [name] referred 01/07/2013 to pulmonary  by Dr Colin Benton for new cough x early 2014   HPI 01/07/2013 1st pulmonary eval in EMR era previously eval   remotely (pre EMR) by Joya Gaskins with prednisone responsive cough  Now with recurrent cough x 5 years usually better with abx but never resoved then about 5 months prior to OV   more persistent, daily,  and not better with abx assoc with recurrent dysphagia s/p stent which relieved the dysphagia. Cough is [name] = night, prod of mod abmt so mucoid sputum and intermittent  Blood up to a tbsp at a time.while maintained on coumadin for afib.. rec Prednisone 10 mg take  4 each am x 2 days,   2 each am x 2 days,  1 each am x 2 days and stop Hold coumadin when coughing up blood   01/15/2013 f/u ov/[name] re cough/hemoptysis/ still on coumadin Chief Complaint  Patient presents with  . Follow-up    Pt states her cough is unchnaged since the last visit. Still waking up at least twice in the night with hemoptysis.   no epistaxis, less than 2 tbsp of blood per [name]. Mild dyspnea with acitivities of daily living. >FOB 01/18/13 no bleeding identified, completely clear airways but coughed up bilious mucus during procedure ? From stomach  11/04/13 rx omnicef x 10 d fever, cough with yellow no better  11/11/13 levaquin x 10   11/12/2013 f/u ov/[name] re: started levaquin 11/11/13 "sick x 3-4 months"/ confused with meds/ niece has taken them over and uses pill box but no master list / not using laba/ics bid as maint as rec  Chief Complaint  Patient presents with  . Follow-up    Pt reports has had PNA x 2 since her last visit here in June 2014. She states that her breathing has been worse for the past 3-4 months. She also c/o increased cough- prod with  moderate yellow sputum.   Denies overt hb/ reflux/aspiration. Not limited by breathing from desired activities  But very sedentary  >>brovana/pulmicort   12/02/2013  Follow up and Med review  Pt returns for follow up and med review .  Pt is here with family who help with her meds.  She did not bring pill bottles as she gets sheets of meds and puts them into pill box. We reviewed her med list.  She is taking Dulera and Pulmicort [place] Does reports some increased head congestion/PND and prod cough with clear mucus mixed with BRB, wheezing, increased SOB - symptoms worsened x2 weeks.  denies f/c/s, tightness, nausea, vomiting. No fever, or discolored mucus  Is on Coumadin. Last INR 1.9. 1 week ago.  No frank hemoptysis, blood tinged mucus.  No chest pain or orthopnea.  Swelling is less than usual.  rec Hold coumadin today  Chest xray and labs today  Mucinex DM Twice daily  As needed  Cough /congestion  Prednisone 10 mg take  4 each am x 2 days,   2 each am x 2 days,  1 each am x 2 days and stop Follow med list as discussed  Stop Dulera .  Continue on Brovana and Budesonide "Neb" Twice daily     12/10/2013 f/u ov/[name] re:  Chief Complaint  Patient presents with  . Follow-up    Pt states cough is worse "now I have an asthma cough".  Still still has some hemoptysis. She is using ventolin  3-4 x per [name].     Hemoptysis is streaky and < 1 -2 tsp at most on bad days. Sob better p ventolin rec For cough  mucinex dm up to 1200 mg every 12 hours as needed with flutter valve as much as possible and supplement with tussionex if needed For breathing  Plan A= automatic = brovana/budesonide twice daily perfectly regularly  Plan B = ventolin hfa Only use your albuterol as a rescue medication   Plan C = backup or Plan B = albuterol neb up to every 4 hours if needed  Hold coumadin anytime you have excess bleeding > stopped 12/11/13    04/14/2014 f/u ov/[name] re: recurrent asp pna, related to es  dysfunction  Chief Complaint  Patient presents with  . Follow-up    Pt c/o increased cough and congesiton x 1 wk. Cough is prod with minimal yellow sputum.  She states that her breathing is overall doing well.   Not limited by breathing from desired activities    No obvious [name] to [name] or daytime variabilty or assoc cp or chest tightness, subjective wheeze overt sinus or hb symptoms. No unusual exp hx or h/o childhood pna/ asthma or knowledge of premature birth.  Sleeping ok without nocturnal  or early am exacerbation  of respiratory  c/o's or need for noct saba. Also denies any obvious fluctuation of symptoms with weather or environmental changes or other aggravating or alleviating factors except as outlined above   Current Medications, Allergies, Complete Past Medical History, Past Surgical History, Family History, and Social History were reviewed in [employer] record.  ROS  The following are not active complaints unless bolded sore throat, dysphagia, dental problems, itching, sneezing,  nasal congestion or excess/ purulent secretions, ear ache,   fever, chills, sweats, unintended wt loss, pleuritic or exertional cp, hemoptysis,  orthopnea pnd or leg swelling, presyncope, palpitations, heartburn, abdominal pain, anorexia, nausea, vomiting, diarrhea  or change in bowel or urinary habits, change in stools or urine, dysuria,hematuria,  rash, arthralgias, visual complaints, headache, numbness weakness or ataxia or problems with walking or coordination,  change in mood/affect or memory.     .               Objective:   Physical Exam  Elderly wf nad but looks at least 33 y older than stated age   01/15/2013      119  >>119 01/25/2013 > 01/28/2013 120 >119 02/25/2013 > 11/12/2013  116 >112 12/02/2013 > 12/10/2013 114 > 12/20/2013 114 > 04/14/2014  110   HEENT: nl dentition, turbinates, and orophanx. Nl external ear canals without cough reflex   NECK :  without JVD/Nodes/TM/  nl carotid upstrokes bilaterally   LUNGS: no acc muscle use, diminshed BS in bases, a few insp/exp rhonchi bilaterally     CV:  RRR  no s3 or murmur or increase in P2, tr-1+ pitting edema bilaterally. No redness or calf tenderness.  Neg homans sign . Venous insufficiency changes   ABD:  soft and nontender with nl excursion in the supine position. No bruits or organomegaly, bowel sounds nl  MS:  warm without deformities, calf tenderness, cyanosis or clubbing  SKIN: warm and dry without lesions    Neuro:  Pos pill rolling tremor on R / no def cogwheeling  CXR  03/03/2014 : 1. Similar left basilar atelectasis/scarring. No other acute  cardiopulmonary abnormality.  2. Stable cardiomegaly.  3. Sequelae of prior esophagectomy with gastric pull-through.

## 2014-04-14 NOTE — Telephone Encounter (Signed)
Refills done.

## 2014-04-14 NOTE — Patient Instructions (Addendum)
For cough ok to use tussionex 1 tsp every 12 hours as needed   For breathing use ventolin (albuterol) 2 pffs every 4 hours as needed   Pulmonary follow up is as needed

## 2014-04-14 NOTE — Telephone Encounter (Signed)
Refilled Prevacid through [employer]

## 2014-04-15 ENCOUNTER — Inpatient Hospital Stay (HOSPITAL_COMMUNITY)
Admission: EM | Admit: 2014-04-15 | Discharge: 2014-04-28 | DRG: 184 | Disposition: A | Discharge status: Hospice | Payer: Medicare Other | Attending: Internal Medicine | Admitting: Internal Medicine

## 2014-04-15 ENCOUNTER — Telehealth: Payer: Self-pay | Admitting: Internal Medicine

## 2014-04-15 ENCOUNTER — Encounter (HOSPITAL_COMMUNITY): Payer: Self-pay | Admitting: Emergency Medicine

## 2014-04-15 DIAGNOSIS — S129XXA Fracture of neck, unspecified, initial encounter: Secondary | ICD-10-CM | POA: Diagnosis present

## 2014-04-15 DIAGNOSIS — Y9301 Activity, walking, marching and hiking: Secondary | ICD-10-CM

## 2014-04-15 DIAGNOSIS — S22009A Unspecified fracture of unspecified thoracic vertebra, initial encounter for closed fracture: Secondary | ICD-10-CM | POA: Diagnosis present

## 2014-04-15 DIAGNOSIS — Z8501 Personal history of malignant neoplasm of esophagus: Secondary | ICD-10-CM

## 2014-04-15 DIAGNOSIS — R296 Repeated falls: Secondary | ICD-10-CM

## 2014-04-15 DIAGNOSIS — Z66 Do not resuscitate: Secondary | ICD-10-CM

## 2014-04-15 DIAGNOSIS — IMO0002 Reserved for concepts with insufficient information to code with codable children: Secondary | ICD-10-CM

## 2014-04-15 DIAGNOSIS — I4891 Unspecified atrial fibrillation: Secondary | ICD-10-CM | POA: Diagnosis present

## 2014-04-15 DIAGNOSIS — R933 Abnormal findings on diagnostic imaging of other parts of digestive tract: Secondary | ICD-10-CM | POA: Diagnosis not present

## 2014-04-15 DIAGNOSIS — R5381 Other malaise: Secondary | ICD-10-CM | POA: Diagnosis present

## 2014-04-15 DIAGNOSIS — W010XXA Fall on same level from slipping, tripping and stumbling without subsequent striking against object, initial encounter: Secondary | ICD-10-CM | POA: Diagnosis present

## 2014-04-15 DIAGNOSIS — Y92009 Unspecified place in unspecified non-institutional (private) residence as the place of occurrence of the external cause: Secondary | ICD-10-CM

## 2014-04-15 DIAGNOSIS — I482 Chronic atrial fibrillation, unspecified: Secondary | ICD-10-CM

## 2014-04-15 DIAGNOSIS — M25519 Pain in unspecified shoulder: Secondary | ICD-10-CM | POA: Diagnosis not present

## 2014-04-15 DIAGNOSIS — Z9181 History of falling: Secondary | ICD-10-CM

## 2014-04-15 DIAGNOSIS — K922 Gastrointestinal hemorrhage, unspecified: Secondary | ICD-10-CM

## 2014-04-15 DIAGNOSIS — W19XXXA Unspecified fall, initial encounter: Secondary | ICD-10-CM | POA: Diagnosis present

## 2014-04-15 DIAGNOSIS — S2249XA Multiple fractures of ribs, unspecified side, initial encounter for closed fracture: Principal | ICD-10-CM | POA: Diagnosis present

## 2014-04-15 DIAGNOSIS — K311 Adult hypertrophic pyloric stenosis: Secondary | ICD-10-CM | POA: Diagnosis present

## 2014-04-15 DIAGNOSIS — Z7901 Long term (current) use of anticoagulants: Secondary | ICD-10-CM

## 2014-04-15 DIAGNOSIS — T50905A Adverse effect of unspecified drugs, medicaments and biological substances, initial encounter: Secondary | ICD-10-CM | POA: Diagnosis not present

## 2014-04-15 DIAGNOSIS — S42009A Fracture of unspecified part of unspecified clavicle, initial encounter for closed fracture: Secondary | ICD-10-CM | POA: Diagnosis present

## 2014-04-15 DIAGNOSIS — F19921 Other psychoactive substance use, unspecified with intoxication with delirium: Secondary | ICD-10-CM

## 2014-04-15 DIAGNOSIS — I10 Essential (primary) hypertension: Secondary | ICD-10-CM | POA: Diagnosis present

## 2014-04-15 DIAGNOSIS — R2689 Other abnormalities of gait and mobility: Secondary | ICD-10-CM | POA: Diagnosis present

## 2014-04-15 DIAGNOSIS — J45909 Unspecified asthma, uncomplicated: Secondary | ICD-10-CM | POA: Diagnosis present

## 2014-04-15 DIAGNOSIS — E46 Unspecified protein-calorie malnutrition: Secondary | ICD-10-CM | POA: Diagnosis present

## 2014-04-15 DIAGNOSIS — S42001A Fracture of unspecified part of right clavicle, initial encounter for closed fracture: Secondary | ICD-10-CM | POA: Diagnosis present

## 2014-04-15 DIAGNOSIS — S12600A Unspecified displaced fracture of seventh cervical vertebra, initial encounter for closed fracture: Secondary | ICD-10-CM | POA: Diagnosis present

## 2014-04-15 DIAGNOSIS — I5032 Chronic diastolic (congestive) heart failure: Secondary | ICD-10-CM | POA: Diagnosis present

## 2014-04-15 DIAGNOSIS — I509 Heart failure, unspecified: Secondary | ICD-10-CM | POA: Diagnosis present

## 2014-04-15 DIAGNOSIS — K219 Gastro-esophageal reflux disease without esophagitis: Secondary | ICD-10-CM | POA: Diagnosis present

## 2014-04-15 DIAGNOSIS — R110 Nausea: Secondary | ICD-10-CM

## 2014-04-15 DIAGNOSIS — S22019A Unspecified fracture of first thoracic vertebra, initial encounter for closed fracture: Secondary | ICD-10-CM | POA: Diagnosis present

## 2014-04-15 DIAGNOSIS — M810 Age-related osteoporosis without current pathological fracture: Secondary | ICD-10-CM | POA: Diagnosis present

## 2014-04-15 DIAGNOSIS — S129XXS Fracture of neck, unspecified, sequela: Secondary | ICD-10-CM

## 2014-04-15 DIAGNOSIS — D62 Acute posthemorrhagic anemia: Secondary | ICD-10-CM

## 2014-04-15 DIAGNOSIS — S2231XA Fracture of one rib, right side, initial encounter for closed fracture: Secondary | ICD-10-CM

## 2014-04-15 DIAGNOSIS — Z85828 Personal history of other malignant neoplasm of skin: Secondary | ICD-10-CM

## 2014-04-15 DIAGNOSIS — S2239XA Fracture of one rib, unspecified side, initial encounter for closed fracture: Secondary | ICD-10-CM | POA: Diagnosis present

## 2014-04-15 DIAGNOSIS — J690 Pneumonitis due to inhalation of food and vomit: Secondary | ICD-10-CM | POA: Diagnosis not present

## 2014-04-15 DIAGNOSIS — S42001S Fracture of unspecified part of right clavicle, sequela: Secondary | ICD-10-CM

## 2014-04-15 DIAGNOSIS — K629 Disease of anus and rectum, unspecified: Secondary | ICD-10-CM

## 2014-04-15 DIAGNOSIS — Z515 Encounter for palliative care: Secondary | ICD-10-CM

## 2014-04-15 DIAGNOSIS — I351 Nonrheumatic aortic (valve) insufficiency: Secondary | ICD-10-CM | POA: Diagnosis present

## 2014-04-15 HISTORY — DX: Acquired absence of other specified parts of digestive tract: Z90.49

## 2014-04-15 HISTORY — DX: Unspecified asthma, uncomplicated: J45.909

## 2014-04-15 HISTORY — DX: Unspecified atrial fibrillation: I48.91

## 2014-04-15 HISTORY — DX: Esophageal obstruction: K22.2

## 2014-04-15 HISTORY — DX: Pneumonia, unspecified organism: J18.9

## 2014-04-15 HISTORY — DX: Unspecified fall, initial encounter: W19.XXXA

## 2014-04-15 HISTORY — DX: Unspecified place in unspecified non-institutional (private) residence as the place of occurrence of the external cause: Y92.009

## 2014-04-15 HISTORY — DX: Nosocomial condition: Y95

## 2014-04-15 HISTORY — DX: Other specified postprocedural states: Z98.890

## 2014-04-15 MED ORDER — TRAMADOL HCL 50 MG PO TABS
50.0000 mg | ORAL_TABLET | Freq: Once | ORAL | Status: AC
  Administered 2014-04-16: 50 mg via ORAL
  Filled 2014-04-15: qty 1

## 2014-04-15 NOTE — Telephone Encounter (Signed)
Spoke with the pharmacist  She states that the pt's ins will only cover 1 mo supply of the tussionex  I advised that this is fine  Nothing further needed Zofran PA to be deferred to PCP since they prescribed this

## 2014-04-15 NOTE — ED Notes (Signed)
Bed: WA08 Expected date: 04/15/14 Expected time: 11:01 PM Means of arrival: Ambulance Comments: Fall, bilateral shoulder pain

## 2014-04-15 NOTE — ED Provider Notes (Signed)
CSN: [id number]     Arrival date & time 04/15/14  2312 History   First MD Initiated Contact with Patient 04/15/14 2353     Chief Complaint  Patient presents with  . Fall  . Shoulder Pain     (Consider location/radiation/quality/duration/timing/severity/associated sxs/prior Treatment) The history is provided by the patient and a relative.  [name] is a 80 y.o. female hx of arthritis here with fall. Was walking with a walker at home and went into the dining room and the walker was caught on the carpet and she had a mechanical fall. Complains of bilateral shoulder pain. States that she hit her shoulders when she fell. Denies head injury or LOC. Has some right sided chest pain as well.    Past Medical History  Diagnosis Date  . GERD (gastroesophageal reflux disease)   . Osteoporosis   . Postmenopausal HRT (hormone replacement therapy)   . Anemia   . Allergy   . Asthma   . Shortness of breath   . PONV (postoperative nausea and vomiting)   . Dysrhythmia     a fib  . Chronic bronchitis     "practically q winter" (01/22/2014)  . History of blood transfusion 1993    "related to esophagus removal"  . Arthritis     "legs" (01/06/2014)  . Pneumonia     "all the time; probably 5 times in the last year, counting today" (01/06/2014)  . Aspiration pneumonia 11/22/2013  . H/O hiatal hernia   . Headache(784.0)     "monthly in the past year" (01/22/2014)  . Chronic lower back pain     "maybe cause I fell in the tub on 01/05/2014"  . Urinary frequency   . Esophageal cancer     removed esophagus stomach pulled up; "encapsulated; didn't have to have radiation"  . Basal cell carcinoma of face     "several; freezes them off; face"   Past Surgical History  Procedure Laterality Date  . Left oophorectomy  1952  . Esophageal removal of cancer,gastric ppull-thru 1993    . Forearm fracture surgery Right ?1990's  . Esophagogastroduodenoscopy  10/19/2011    Procedure:  ESOPHAGOGASTRODUODENOSCOPY (EGD);  Surgeon: Milus Banister, MD;  Location: [facility];  Service: Endoscopy;  Laterality: N/A;  . Balloon dilation  10/19/2011    Procedure: BALLOON DILATION;  Surgeon: Milus Banister, MD;  Location: [facility] ENDOSCOPY;  Service: Endoscopy;  Laterality: N/A;  . Esophagogastroduodenoscopy  11/07/2011    Procedure: ESOPHAGOGASTRODUODENOSCOPY (EGD);  Surgeon: Ladene Artist, MD,FACG;  Location: [facility] ENDOSCOPY;  Service: Endoscopy;  Laterality: N/A;  . Esophagogastroduodenoscopy  11/22/2011    Procedure: ESOPHAGOGASTRODUODENOSCOPY (EGD);  Surgeon: Inda Castle, MD;  Location: [facility];  Service: Endoscopy;  Laterality: N/A;  . Esophagogastroduodenoscopy  11/24/2011    Procedure: ESOPHAGOGASTRODUODENOSCOPY (EGD);  Surgeon: Inda Castle, MD;  Location: [facility];  Service: Endoscopy;  Laterality: N/A;  with removable duodenal stent (actually using esophageal partially covered 23X15 located in locked supply room  . Duodenal stent placement  11/24/2011    Procedure: DUODENAL STENT PLACEMENT;  Surgeon: Inda Castle, MD;  Location: [facility] ENDOSCOPY;  Service: Endoscopy;  Laterality: N/A;  . Video bronchoscopy Bilateral 01/18/2013    Procedure: VIDEO BRONCHOSCOPY WITHOUT FLUORO;  Surgeon: Tanda Rockers, MD;  Location: [facility] ENDOSCOPY;  Service: Cardiopulmonary;  Laterality: Bilateral;  . Esophagogastroduodenoscopy N/A 02/11/2013    Procedure: ESOPHAGOGASTRODUODENOSCOPY (EGD);  Surgeon: Inda Castle, MD;  Location: [facility];  Service: Endoscopy;  Laterality:  N/A;  . Duodenal stent placement N/A 02/11/2013    Procedure: DUODENAL STENT PLACEMENT;  Surgeon: Inda Castle, MD;  Location: [facility]OSCOPY;  Service: Endoscopy;  Laterality: N/A;  . Esophagogastroduodenoscopy N/A 09/11/2013    Procedure: ESOPHAGOGASTRODUODENOSCOPY (EGD);  Surgeon: Gatha Mayer, MD;  Location: [facility];  Service: Endoscopy;  Laterality: N/A;  . Cholecystectomy  ~ 1996  . Dilation and curettage  of uterus  X 6-7  . Cataract extraction w/ intraocular lens  implant, bilateral Bilateral ~ 2000  . Appendectomy     Family History  Problem Relation Age of Onset  . Cervical cancer Mother   . Heart disease Father     MI  . Coronary artery disease Brother   . Hypotension Sister   . Colon cancer Neg Hx   . Colon polyps Sister   . Pancreatic cancer      1/2 sister  . Coronary artery disease Sister     pacemaker  . Asthma Brother   . Asthma Sister    History  Substance Use Topics  . Smoking status: Never Smoker   . Smokeless tobacco: Never Used  . Alcohol Use: No   OB History   Grav Para Term Preterm Abortions TAB SAB Ect Mult Living                 Review of Systems  Musculoskeletal:       Shoulder pain   All other systems reviewed and are negative.     Allergies  Ensure; Erythromycin ethylsuccinate; Prednisone; and Doxycycline  Home Medications   Prior to Admission medications   Medication Sig Start Date End Date Taking? Authorizing Provider  acetaminophen (TYLENOL) 500 MG tablet Take 1 tablet (500 mg total) by mouth every 6 (six) hours as needed for headache. 12/02/13  Yes Tammy S Parrett, NP  albuterol (PROVENTIL HFA;VENTOLIN HFA) 108 (90 BASE) MCG/ACT inhaler Inhale 2 puffs into the lungs every 6 (six) hours as needed. For wheezing 12/02/13  Yes Tammy S Parrett, NP  amiodarone (PACERONE) 200 MG tablet Take 1 tablet (200 mg total) by mouth daily. 04/14/14  Yes Timoteo Gaul, FNP  chlorpheniramine-HYDROcodone (TUSSIONEX PENNKINETIC ER) 10-8 MG/5ML LQCR 1 tsp every 12 hours as needed 04/14/14  Yes Tanda Rockers, MD  ergocalciferol (VITAMIN D2) 50000 UNITS capsule Take 1 capsule (50,000 Units total) by mouth every Friday. 04/01/14  Yes Timoteo Gaul, FNP  Fe Fum-FA-B Cmp-C-Zn-Mg-Mn-Cu (HEMOCYTE-PLUS) 106-1 MG TABS Take 1 capsule by mouth 2 (two) times daily. 04/14/14  Yes Timoteo Gaul, FNP  furosemide (LASIX) 40 MG tablet Take 1 tablet (40 mg total) by mouth  daily. 04/01/14  Yes Timoteo Gaul, FNP  lansoprazole (PREVACID) 15 MG capsule Take 2 capsules (30 mg total) by mouth 2 (two) times daily before a meal. 04/14/14  Yes Inda Castle, MD  methylPREDNIsolone (MEDROL DOSPACK) 4 MG tablet Take by mouth See admin instructions. Take 6 tabs on the first day, tapering by 1 tablet each day for 6 days. 04/01/14  Yes Historical Provider, MD  metoCLOPramide (REGLAN) 5 MG tablet Take 5 mg by mouth 2 (two) times daily. 1 hour before breakfast and supper   Yes Historical Provider, MD  Multiple Vitamins-Calcium (VIACTIV MULTI-VITAMIN) CHEW Chew 1 each by mouth daily. 12/02/13  Yes Tammy S Parrett, NP  Olopatadine HCl (PATADAY) 0.2 % SOLN Apply 1-2 drops to eye 2 (two) times daily as needed (dry, itchy eyes.).   Yes Historical Provider, MD  ondansetron (  ZOFRAN) 4 MG tablet Take 1 tablet (4 mg total) by mouth at bedtime. 04/01/14  Yes Timoteo Gaul, FNP  sucralfate (CARAFATE) 1 GM/10ML suspension Take 10 mLs (1 g total) by mouth 2 (two) times daily before a meal. 01/09/14  Yes Delfina Redwood, MD  traMADol (ULTRAM) 50 MG tablet Take 1 tablet (50 mg total) by mouth every 8 (eight) hours as needed for moderate pain. 04/01/14  Yes Timoteo Gaul, FNP  zolpidem (AMBIEN) 5 MG tablet Take 5 mg by mouth at bedtime.   Yes Historical Provider, MD   BP 124/74  Pulse 84  Temp(Src) 98.3 F (36.8 C) (Oral)  Resp 15  Ht 5' (1.524 m)  Wt 110 lb (49.896 kg)  BMI 21.48 kg/m2  SpO2 95% Physical Exam  Nursing note and vitals reviewed. Constitutional: She is oriented to person, place, and time.  Uncomfortable   HENT:  Head: Normocephalic.  Mouth/Throat: Oropharynx is clear and moist.  Eyes: Conjunctivae and EOM are normal. Pupils are equal, round, and reactive to light.  Neck: Normal range of motion. Neck supple.  Cardiovascular: Normal rate, regular rhythm and normal heart sounds.   Pulmonary/Chest: Effort normal and breath sounds normal. No respiratory distress. She  has no wheezes. She has no rales.  Abdominal: Soft. Bowel sounds are normal. She exhibits no distension. There is no tenderness. There is no rebound.  Musculoskeletal:  Abrasion R shoulder. Dec ROM bilateral shoulders but no obvious deformity. No other upper extremity trauma. 2+ pulses, neurovascular intact. Hips nl ROM   Neurological: She is alert and oriented to person, place, and time. No cranial nerve deficit. Coordination normal.  Skin: Skin is warm and dry.  Psychiatric: She has a normal mood and affect. Her behavior is normal. Judgment and thought content normal.    ED Course  Procedures (including critical care time) Labs Review Labs Reviewed  CBC WITH DIFFERENTIAL - Abnormal; Notable for the following:    WBC 11.3 (*)    Hemoglobin 11.7 (*)    MCH 25.5 (*)    RDW 17.0 (*)    Neutrophils Relative % 81 (*)    Neutro Abs 9.3 (*)    Lymphocytes Relative 9 (*)    All other components within normal limits  COMPREHENSIVE METABOLIC PANEL - Abnormal; Notable for the following:    Potassium 3.3 (*)    Chloride 95 (*)    Glucose, Bld 127 (*)    Total Protein 5.9 (*)    Albumin 3.2 (*)    GFR calc non Af Amer 79 (*)    All other components within normal limits  Randolm Idol, ED    Imaging Review Dg Chest 2 View  04/16/2014   CLINICAL DATA:  Fall, upper back pain.  Shoulder pain.  EXAM: CHEST  2 VIEW  COMPARISON:  Chest radiograph March 03, 2014  FINDINGS: The cardiac silhouette appears moderate to severely enlarged, unchanged. Mildly calcified aortic knob. Descending thoracic aorta stent. Central pulmonary vascular congestion. Increased lung volumes with flattened hemidiaphragm. No pleural effusions or focal consolidation. Similar strandy densities LEFT lung base. No pneumothorax.  Distal RIGHT clavicle fracture appears acute. New mild to moderate proximate T3 compression fracture may be acute. Soft tissue planes are nonsuspicious.  IMPRESSION: Acute RIGHT distal clavicle  fracture. Mild to moderate approximate T3 compression fracture may be acute, recommend correlation with point tenderness.  Stable appearance of the chest: Cardiomegaly. Central pulmonary vascular congestion. LEFT lung base atelectasis versus scarring.   Electronically Signed  By: Elon Alas   On: 04/16/2014 01:26   Dg Shoulder Right  04/16/2014   CLINICAL DATA:  Fall, shoulder pain.  EXAM: RIGHT SHOULDER - 2+ VIEW  COMPARISON:  None.  FINDINGS: Comminuted nondisplaced distal RIGHT clavicle fracture. Humeral head is well formed and located. No dislocation. No destructive bony lesions.  Nondisplaced RIGHT lateral second and possibly third rib fractures.  IMPRESSION: Nondisplaced acute appearing distal RIGHT clavicle fracture. No dislocation.  Nondisplaced RIGHT lateral second and possibly third rib fractures.   Electronically Signed   By: Elon Alas   On: 04/16/2014 01:28   Dg Shoulder Left  04/16/2014   CLINICAL DATA:  Fall, pain.  EXAM: LEFT SHOULDER - 2+ VIEW  COMPARISON:  None.  FINDINGS: No acute fracture deformity or dislocation. Joint space intact without erosions. No destructive bony lesions. Soft tissue planes are not suspicious. Remote LEFT lateral rib fractures. In addition, age indeterminate ninth lateral rib fracture.  IMPRESSION: No acute shoulder fracture deformity or dislocation.  Age indeterminate LEFT lateral ninth rib fracture, recommend correlation with point tenderness.   Electronically Signed   By: Elon Alas   On: 04/16/2014 01:29     EKG Interpretation None      MDM   Final diagnoses:  None    [name] is a 80 y.o. female here with fall, no head injury. Will get xrays to r/o fracture.   3:14 AM Xray showed R clavicle fracture, rib fractures, thoracic compression fracture. Still in pain after pain meds. Will consult surgery for admission for pain control.      Wandra Arthurs, MD 04/16/14 [phone]

## 2014-04-15 NOTE — ED Notes (Signed)
Per EMS: Pt from home, lives by herself. Pt was coming out of kitchen into dining room, from linoleum to carpet, using walker. Walker caught on carpet, and pt states she had "an easy fall to floor." Denies neck or back pain. No head injury. No dizziness before or after fall. C/o bilateral arm and shoulder pain. L shoulder appears to have decreased muscle tone. R arm/ shoulder hurts more than left. No swelling or deformity noted. Pain only with active ROM. No pain with passive ROM. A&O x 4.

## 2014-04-16 ENCOUNTER — Encounter (HOSPITAL_COMMUNITY): Payer: Self-pay | Admitting: Neurology

## 2014-04-16 ENCOUNTER — Inpatient Hospital Stay (HOSPITAL_COMMUNITY): Payer: Medicare Other

## 2014-04-16 ENCOUNTER — Emergency Department (HOSPITAL_COMMUNITY): Payer: Medicare Other

## 2014-04-16 ENCOUNTER — Encounter: Payer: Self-pay | Admitting: Internal Medicine

## 2014-04-16 DIAGNOSIS — E46 Unspecified protein-calorie malnutrition: Secondary | ICD-10-CM | POA: Diagnosis present

## 2014-04-16 DIAGNOSIS — J45909 Unspecified asthma, uncomplicated: Secondary | ICD-10-CM | POA: Diagnosis present

## 2014-04-16 DIAGNOSIS — S12600A Unspecified displaced fracture of seventh cervical vertebra, initial encounter for closed fracture: Secondary | ICD-10-CM | POA: Diagnosis present

## 2014-04-16 DIAGNOSIS — K922 Gastrointestinal hemorrhage, unspecified: Secondary | ICD-10-CM | POA: Diagnosis not present

## 2014-04-16 DIAGNOSIS — S2249XA Multiple fractures of ribs, unspecified side, initial encounter for closed fracture: Secondary | ICD-10-CM | POA: Diagnosis present

## 2014-04-16 DIAGNOSIS — I4891 Unspecified atrial fibrillation: Secondary | ICD-10-CM | POA: Diagnosis present

## 2014-04-16 DIAGNOSIS — D62 Acute posthemorrhagic anemia: Secondary | ICD-10-CM | POA: Diagnosis present

## 2014-04-16 DIAGNOSIS — W19XXXA Unspecified fall, initial encounter: Secondary | ICD-10-CM | POA: Diagnosis present

## 2014-04-16 DIAGNOSIS — S22019A Unspecified fracture of first thoracic vertebra, initial encounter for closed fracture: Secondary | ICD-10-CM | POA: Diagnosis present

## 2014-04-16 DIAGNOSIS — W010XXA Fall on same level from slipping, tripping and stumbling without subsequent striking against object, initial encounter: Secondary | ICD-10-CM | POA: Diagnosis present

## 2014-04-16 DIAGNOSIS — Z66 Do not resuscitate: Secondary | ICD-10-CM | POA: Diagnosis present

## 2014-04-16 DIAGNOSIS — Z85828 Personal history of other malignant neoplasm of skin: Secondary | ICD-10-CM | POA: Diagnosis not present

## 2014-04-16 DIAGNOSIS — S42001A Fracture of unspecified part of right clavicle, initial encounter for closed fracture: Secondary | ICD-10-CM | POA: Diagnosis present

## 2014-04-16 DIAGNOSIS — Y92009 Unspecified place in unspecified non-institutional (private) residence as the place of occurrence of the external cause: Secondary | ICD-10-CM | POA: Diagnosis not present

## 2014-04-16 DIAGNOSIS — Z9181 History of falling: Secondary | ICD-10-CM | POA: Diagnosis not present

## 2014-04-16 DIAGNOSIS — Y9301 Activity, walking, marching and hiking: Secondary | ICD-10-CM | POA: Diagnosis not present

## 2014-04-16 DIAGNOSIS — Z8501 Personal history of malignant neoplasm of esophagus: Secondary | ICD-10-CM | POA: Diagnosis not present

## 2014-04-16 DIAGNOSIS — R5381 Other malaise: Secondary | ICD-10-CM | POA: Diagnosis present

## 2014-04-16 DIAGNOSIS — S22009A Unspecified fracture of unspecified thoracic vertebra, initial encounter for closed fracture: Secondary | ICD-10-CM | POA: Diagnosis present

## 2014-04-16 DIAGNOSIS — IMO0002 Reserved for concepts with insufficient information to code with codable children: Secondary | ICD-10-CM | POA: Diagnosis not present

## 2014-04-16 DIAGNOSIS — M25519 Pain in unspecified shoulder: Secondary | ICD-10-CM | POA: Diagnosis present

## 2014-04-16 DIAGNOSIS — K219 Gastro-esophageal reflux disease without esophagitis: Secondary | ICD-10-CM | POA: Diagnosis present

## 2014-04-16 DIAGNOSIS — Z515 Encounter for palliative care: Secondary | ICD-10-CM | POA: Diagnosis not present

## 2014-04-16 DIAGNOSIS — S42009A Fracture of unspecified part of unspecified clavicle, initial encounter for closed fracture: Secondary | ICD-10-CM | POA: Diagnosis present

## 2014-04-16 DIAGNOSIS — S2239XA Fracture of one rib, unspecified side, initial encounter for closed fracture: Secondary | ICD-10-CM | POA: Diagnosis present

## 2014-04-16 DIAGNOSIS — I509 Heart failure, unspecified: Secondary | ICD-10-CM | POA: Diagnosis present

## 2014-04-16 DIAGNOSIS — M810 Age-related osteoporosis without current pathological fracture: Secondary | ICD-10-CM | POA: Diagnosis present

## 2014-04-16 LAB — COMPREHENSIVE METABOLIC PANEL
ALT: 26 U/L (ref 0–35)
ANION GAP: 12 (ref 5–15)
AST: 23 U/L (ref 0–37)
Albumin: 3.2 g/dL — ABNORMAL LOW (ref 3.5–5.2)
Alkaline Phosphatase: 61 U/L (ref 39–117)
BUN: 12 mg/dL (ref 6–23)
CALCIUM: 9.3 mg/dL (ref 8.4–10.5)
CO2: 31 meq/L (ref 19–32)
Chloride: 95 mEq/L — ABNORMAL LOW (ref 96–112)
Creatinine, Ser: 0.67 mg/dL (ref 0.50–1.10)
GFR calc Af Amer: >90 mL/min (ref >90)
GFR, EST NON AFRICAN AMERICAN: 79 mL/min — AB (ref >90)
Glucose, Bld: 127 mg/dL — ABNORMAL HIGH (ref 70–99)
Potassium: 3.3 mEq/L — ABNORMAL LOW (ref 3.7–5.3)
Sodium: 138 mEq/L (ref 137–147)
Total Bilirubin: 0.6 mg/dL (ref 0.3–1.2)
Total Protein: 5.9 g/dL — ABNORMAL LOW (ref 6.0–8.3)

## 2014-04-16 LAB — CBC WITH DIFFERENTIAL/PLATELET
Basophils Absolute: 0 10*3/uL (ref 0.0–0.1)
Basophils Relative: 0 % (ref 0–1)
EOS ABS: 0.1 10*3/uL (ref 0.0–0.7)
EOS PCT: 1 % (ref 0–5)
HEMATOCRIT: 37.1 % (ref 36.0–46.0)
HEMOGLOBIN: 11.7 g/dL — AB (ref 12.0–15.0)
LYMPHS ABS: 1 10*3/uL (ref 0.7–4.0)
LYMPHS PCT: 9 % — AB (ref 12–46)
MCH: 25.5 pg — AB (ref 26.0–34.0)
MCHC: 31.5 g/dL (ref 30.0–36.0)
MCV: 80.8 fL (ref 78.0–100.0)
MONO ABS: 1 10*3/uL (ref 0.1–1.0)
MONOS PCT: 9 % (ref 3–12)
Neutro Abs: 9.3 10*3/uL — ABNORMAL HIGH (ref 1.7–7.7)
Neutrophils Relative %: 81 % — ABNORMAL HIGH (ref 43–77)
Platelets: 226 10*3/uL (ref 150–400)
RBC: 4.59 MIL/uL (ref 3.87–5.11)
RDW: 17 % — ABNORMAL HIGH (ref 11.5–15.5)
WBC: 11.3 10*3/uL — AB (ref 4.0–10.5)

## 2014-04-16 MED ORDER — MORPHINE SULFATE 4 MG/ML IJ SOLN
4.0000 mg | Freq: Once | INTRAMUSCULAR | Status: AC
  Administered 2014-04-16: 4 mg via INTRAVENOUS
  Filled 2014-04-16: qty 1

## 2014-04-16 MED ORDER — FUROSEMIDE 40 MG PO TABS
40.0000 mg | ORAL_TABLET | Freq: Every day | ORAL | Status: DC
  Administered 2014-04-16 – 2014-04-22 (×7): 40 mg via ORAL
  Filled 2014-04-16 (×7): qty 1

## 2014-04-16 MED ORDER — TRAMADOL HCL 50 MG PO TABS: 50.0000 mg | ORAL_TABLET | Freq: Four times a day (QID) | ORAL | Status: DC | PRN

## 2014-04-16 MED ORDER — PANTOPRAZOLE SODIUM 40 MG PO TBEC
40.0000 mg | DELAYED_RELEASE_TABLET | Freq: Two times a day (BID) | ORAL | Status: DC
  Administered 2014-04-16 – 2014-04-22 (×12): 40 mg via ORAL
  Filled 2014-04-16 (×12): qty 1

## 2014-04-16 MED ORDER — HYDROCODONE-ACETAMINOPHEN 5-325 MG PO TABS
2.0000 | ORAL_TABLET | Freq: Once | ORAL | Status: AC
  Administered 2014-04-16: 2 via ORAL
  Filled 2014-04-16: qty 2

## 2014-04-16 MED ORDER — GUAIFENESIN 100 MG/5ML PO SYRP
200.0000 mg | ORAL_SOLUTION | ORAL | Status: DC | PRN
  Administered 2014-04-18 – 2014-04-19 (×2): 200 mg via ORAL
  Filled 2014-04-16 (×3): qty 10

## 2014-04-16 MED ORDER — ONDANSETRON HCL 4 MG PO TABS: 4.0000 mg | ORAL_TABLET | Freq: Four times a day (QID) | ORAL | Status: DC | PRN

## 2014-04-16 MED ORDER — SODIUM CHLORIDE 0.9 % IV SOLN
250.0000 mL | INTRAVENOUS | Status: DC | PRN
Start: 2014-04-16 — End: 2014-04-28

## 2014-04-16 MED ORDER — ALBUTEROL SULFATE (2.5 MG/3ML) 0.083% IN NEBU: 3.0000 mL | INHALATION_SOLUTION | Freq: Four times a day (QID) | RESPIRATORY_TRACT | Status: DC | PRN

## 2014-04-16 MED ORDER — ONDANSETRON HCL 4 MG PO TABS: 4.0000 mg | ORAL_TABLET | Freq: Every day | ORAL | Status: DC

## 2014-04-16 MED ORDER — SODIUM CHLORIDE 0.9 % IJ SOLN: 3.0000 mL | INTRAMUSCULAR | Status: DC | PRN

## 2014-04-16 MED ORDER — TRAMADOL HCL 50 MG PO TABS
100.0000 mg | ORAL_TABLET | Freq: Four times a day (QID) | ORAL | Status: DC | PRN
  Administered 2014-04-16 (×3): 100 mg via ORAL
  Filled 2014-04-16 (×3): qty 2

## 2014-04-16 MED ORDER — BISACODYL 10 MG RE SUPP: 10.0000 mg | Freq: Every day | RECTAL | Status: DC | PRN

## 2014-04-16 MED ORDER — SUCRALFATE 1 GM/10ML PO SUSP
1.0000 g | Freq: Two times a day (BID) | ORAL | Status: DC
  Administered 2014-04-16 – 2014-04-22 (×12): 1 g via ORAL
  Filled 2014-04-16 (×15): qty 10

## 2014-04-16 MED ORDER — DOCUSATE SODIUM 100 MG PO CAPS
100.0000 mg | ORAL_CAPSULE | Freq: Two times a day (BID) | ORAL | Status: DC
  Administered 2014-04-16 – 2014-04-18 (×6): 100 mg via ORAL
  Filled 2014-04-16 (×7): qty 1

## 2014-04-16 MED ORDER — AMIODARONE HCL 200 MG PO TABS
200.0000 mg | ORAL_TABLET | Freq: Every day | ORAL | Status: DC
  Administered 2014-04-16 – 2014-04-28 (×13): 200 mg via ORAL
  Filled 2014-04-16 (×13): qty 1

## 2014-04-16 MED ORDER — ONDANSETRON HCL 4 MG/2ML IJ SOLN
4.0000 mg | Freq: Four times a day (QID) | INTRAMUSCULAR | Status: DC | PRN
  Administered 2014-04-17 (×2): 4 mg via INTRAVENOUS
  Filled 2014-04-16 (×2): qty 2

## 2014-04-16 MED ORDER — SODIUM CHLORIDE 0.9 % IJ SOLN
3.0000 mL | Freq: Two times a day (BID) | INTRAMUSCULAR | Status: DC
  Administered 2014-04-16 – 2014-04-27 (×21): 3 mL via INTRAVENOUS

## 2014-04-16 MED ORDER — ENOXAPARIN SODIUM 30 MG/0.3ML ~~LOC~~ SOLN
30.0000 mg | SUBCUTANEOUS | Status: DC
  Administered 2014-04-16 – 2014-04-21 (×6): 30 mg via SUBCUTANEOUS
  Filled 2014-04-16 (×7): qty 0.3

## 2014-04-16 MED ORDER — MORPHINE SULFATE 2 MG/ML IJ SOLN
2.0000 mg | INTRAMUSCULAR | Status: DC | PRN
  Administered 2014-04-16 – 2014-04-25 (×13): 2 mg via INTRAVENOUS
  Filled 2014-04-16 (×13): qty 1

## 2014-04-16 MED ORDER — METOCLOPRAMIDE HCL 10 MG PO TABS
5.0000 mg | ORAL_TABLET | Freq: Two times a day (BID) | ORAL | Status: DC
  Administered 2014-04-16 – 2014-04-22 (×12): 5 mg via ORAL
  Filled 2014-04-16: qty 1
  Filled 2014-04-16: qty 0.5
  Filled 2014-04-16: qty 1
  Filled 2014-04-16 (×3): qty 0.5
  Filled 2014-04-16: qty 1
  Filled 2014-04-16 (×5): qty 0.5
  Filled 2014-04-16: qty 1
  Filled 2014-04-16 (×3): qty 0.5

## 2014-04-16 MED ORDER — TRAMADOL HCL 50 MG PO TABS: 50.0000 mg | ORAL_TABLET | Freq: Three times a day (TID) | ORAL | Status: DC | PRN

## 2014-04-16 MED ORDER — PANTOPRAZOLE SODIUM 20 MG PO TBEC
20.0000 mg | DELAYED_RELEASE_TABLET | Freq: Once | ORAL | Status: AC
  Administered 2014-04-16: 20 mg via ORAL
  Filled 2014-04-16: qty 1

## 2014-04-16 MED ORDER — MORPHINE SULFATE 2 MG/ML IJ SOLN
1.0000 mg | INTRAMUSCULAR | Status: DC | PRN
  Administered 2014-04-16: 2 mg via INTRAVENOUS
  Filled 2014-04-16: qty 1

## 2014-04-16 MED ORDER — ONDANSETRON HCL 4 MG PO TABS
4.0000 mg | ORAL_TABLET | Freq: Every day | ORAL | Status: DC
  Administered 2014-04-16 – 2014-04-21 (×6): 4 mg via ORAL
  Filled 2014-04-16 (×7): qty 1

## 2014-04-16 MED ORDER — PANTOPRAZOLE SODIUM 20 MG PO TBEC
20.0000 mg | DELAYED_RELEASE_TABLET | Freq: Every day | ORAL | Status: DC
Start: 2014-04-16 — End: 2014-04-16

## 2014-04-16 MED ORDER — FERROUS FUMARATE 325 (106 FE) MG PO TABS
1.0000 | ORAL_TABLET | Freq: Two times a day (BID) | ORAL | Status: DC
  Administered 2014-04-16 – 2014-04-22 (×12): 106 mg via ORAL
  Filled 2014-04-16 (×14): qty 1

## 2014-04-16 MED ORDER — TRAMADOL HCL 50 MG PO TABS: 75.0000 mg | ORAL_TABLET | Freq: Four times a day (QID) | ORAL | Status: DC | PRN

## 2014-04-16 MED ORDER — HYDROCODONE-ACETAMINOPHEN 5-325 MG PO TABS
1.0000 | ORAL_TABLET | ORAL | Status: DC | PRN
  Filled 2014-04-16: qty 2

## 2014-04-16 MED ORDER — PANTOPRAZOLE SODIUM 40 MG PO PACK
20.0000 mg | PACK | Freq: Every day | ORAL | Status: DC
  Administered 2014-04-16: 20 mg via ORAL
  Filled 2014-04-16 (×2): qty 20

## 2014-04-16 MED ORDER — ZOLPIDEM TARTRATE 5 MG PO TABS
5.0000 mg | ORAL_TABLET | Freq: Every day | ORAL | Status: DC
  Administered 2014-04-16 – 2014-04-21 (×6): 5 mg via ORAL
  Filled 2014-04-16 (×6): qty 1

## 2014-04-16 NOTE — Progress Notes (Signed)
PT Cancellation Note  Patient Details Name: [name] MRN: [id number] DOB: Jan 21, 1929   Cancelled Treatment:    Reason Eval/Treat Not Completed: Patient not medically ready. Awaiting NS consult regarding T3 fx; will hold till medically appropriate.    Elie Confer Go[place], [place] 04/16/2014, 9:46 AM

## 2014-04-16 NOTE — Progress Notes (Signed)
Will get CT of T-spine and chest.  Also get neck CT.  This patient has been seen and I agree with the findings and treatment plan.  Kathryne Eriksson. Dahlia Bailiff, MD, [place] [phone] (pager) [phone] (direct pager) Trauma Surgeon

## 2014-04-16 NOTE — Assessment & Plan Note (Signed)
Last swallow eval 01/23/14  Regular;Thin liquid  Liquid Administration via: Cup;Straw  Medication Administration: Whole meds with liquid  Supervision: Patient able to self feed;Intermittent supervision to cue for compensatory strategies  Compensations: Slow rate;Follow solids with liquid;Small sips/bites  Postural Changes and/or Swallow Maneuvers: Out of bed for meals;Seated upright 90 degrees;Upright 30-60 min after meal   Reviewed importance of following recs/ nothing else to offer from pulmonary perspective

## 2014-04-16 NOTE — Progress Notes (Signed)
UR completed.  Await PT/OT evals to determine pt's needs for next level of care.   Sandi Mariscal, RN BSN [place] CCM Trauma/Neuro ICU Case Manager [phone]

## 2014-04-16 NOTE — H&P (Signed)
History   [name] is an 80 y.o. female.   Chief Complaint:  Chief Complaint  Patient presents with  . Fall  . Shoulder Pain   She was walking with her walker at home and caught this on the carpet. She fell back onto her mid back and shoulders. She subsequently was brought to the emergency department at [facility].  X-rays demonstrated a right distal clavicle fracture, a T3 fracture, and one, possibly 2 rib fractures on the right as well. She does fall intermittently and has a left wrist injury which is being treated by Dr. Gladstone Lighter who is her Orthopedic Surgeon.  Because of the multiple fractures, we were asked to see her. Her pain is adequately controlled with morphine.  She did not hit her head. She denies loss of consciousness. Shoulder Pain Pertinent negatives include no abdominal pain or neck pain.    Past Medical History  Diagnosis Date  . GERD (gastroesophageal reflux disease)   . Osteoporosis   . Postmenopausal HRT (hormone replacement therapy)   . Anemia   . Allergy   . Asthma   . Shortness of breath   . PONV (postoperative nausea and vomiting)   . Dysrhythmia     a fib  . Chronic bronchitis     "practically q winter" (01/22/2014)  . History of blood transfusion 1993    "related to esophagus removal"  . Arthritis     "legs" (01/06/2014)  . Pneumonia     "all the time; probably 5 times in the last year, counting today" (01/06/2014)  . Aspiration pneumonia 11/22/2013  . H/O hiatal hernia   . Headache(784.0)     "monthly in the past year" (01/22/2014)  . Chronic lower back pain     "maybe cause I fell in the tub on 01/05/2014"  . Urinary frequency   . Esophageal cancer     removed esophagus stomach pulled up; "encapsulated; didn't have to have radiation"  . Basal cell carcinoma of face     "several; freezes them off; face"    Past Surgical History  Procedure Laterality Date  . Left oophorectomy  1952  . Esophageal removal of cancer,gastric ppull-thru 1993     . Forearm fracture surgery Right ?1990's  . Esophagogastroduodenoscopy  10/19/2011    Procedure: ESOPHAGOGASTRODUODENOSCOPY (EGD);  Surgeon: Milus Banister, MD;  Location: [facility];  Service: Endoscopy;  Laterality: N/A;  . Balloon dilation  10/19/2011    Procedure: BALLOON DILATION;  Surgeon: Milus Banister, MD;  Location: [facility] ENDOSCOPY;  Service: Endoscopy;  Laterality: N/A;  . Esophagogastroduodenoscopy  11/07/2011    Procedure: ESOPHAGOGASTRODUODENOSCOPY (EGD);  Surgeon: Ladene Artist, MD,FACG;  Location: [facility] ENDOSCOPY;  Service: Endoscopy;  Laterality: N/A;  . Esophagogastroduodenoscopy  11/22/2011    Procedure: ESOPHAGOGASTRODUODENOSCOPY (EGD);  Surgeon: Inda Castle, MD;  Location: [facility];  Service: Endoscopy;  Laterality: N/A;  . Esophagogastroduodenoscopy  11/24/2011    Procedure: ESOPHAGOGASTRODUODENOSCOPY (EGD);  Surgeon: Inda Castle, MD;  Location: [facility];  Service: Endoscopy;  Laterality: N/A;  with removable duodenal stent (actually using esophageal partially covered 23X15 located in locked supply room  . Duodenal stent placement  11/24/2011    Procedure: DUODENAL STENT PLACEMENT;  Surgeon: Inda Castle, MD;  Location: [facility] ENDOSCOPY;  Service: Endoscopy;  Laterality: N/A;  . Video bronchoscopy Bilateral 01/18/2013    Procedure: VIDEO BRONCHOSCOPY WITHOUT FLUORO;  Surgeon: Tanda Rockers, MD;  Location: [facility] ENDOSCOPY;  Service: Cardiopulmonary;  Laterality: Bilateral;  .  Esophagogastroduodenoscopy N/A 02/11/2013    Procedure: ESOPHAGOGASTRODUODENOSCOPY (EGD);  Surgeon: Inda Castle, MD;  Location: [facility];  Service: Endoscopy;  Laterality: N/A;  . Duodenal stent placement N/A 02/11/2013    Procedure: DUODENAL STENT PLACEMENT;  Surgeon: Inda Castle, MD;  Location: [facility] ENDOSCOPY;  Service: Endoscopy;  Laterality: N/A;  . Esophagogastroduodenoscopy N/A 09/11/2013    Procedure: ESOPHAGOGASTRODUODENOSCOPY (EGD);  Surgeon: Gatha Mayer, MD;  Location: [facility]  ENDOSCOPY;  Service: Endoscopy;  Laterality: N/A;  . Cholecystectomy  ~ 1996  . Dilation and curettage of uterus  X 6-7  . Cataract extraction w/ intraocular lens  implant, bilateral Bilateral ~ 2000  . Appendectomy      Family History  Problem Relation Age of Onset  . Cervical cancer Mother   . Heart disease Father     MI  . Coronary artery disease Brother   . Hypotension Sister   . Colon cancer Neg Hx   . Colon polyps Sister   . Pancreatic cancer      1/2 sister  . Coronary artery disease Sister     pacemaker  . Asthma Brother   . Asthma Sister    Social History:  reports that she has never smoked. She has never used smokeless tobacco. She reports that she does not drink alcohol or use illicit drugs.  Allergies   Allergies  Allergen Reactions  . Ensure [Nutritional Supplements]     "gives me diarrhea"  . Erythromycin Ethylsuccinate     Irregular pulse rate  . Prednisone     "makes me hyper"  . Doxycycline Rash    Home Medications   (Not in a hospital admission)  Trauma Course   Results for orders placed during the hospital encounter of 04/15/14 (from the past 48 hour(s))  CBC WITH DIFFERENTIAL     Status: Abnormal   Collection Time    04/16/14  2:11 AM      Result Value Ref Range   WBC 11.3 (*) 4.0 - 10.5 K/uL   RBC 4.59  3.87 - 5.11 MIL/uL   Hemoglobin 11.7 (*) 12.0 - 15.0 g/dL   HCT 37.1  36.0 - 46.0 %   MCV 80.8  78.0 - 100.0 fL   MCH 25.5 (*) 26.0 - 34.0 pg   MCHC 31.5  30.0 - 36.0 g/dL   RDW 17.0 (*) 11.5 - 15.5 %   Platelets 226  150 - 400 K/uL   Neutrophils Relative % 81 (*) 43 - 77 %   Neutro Abs 9.3 (*) 1.7 - 7.7 K/uL   Lymphocytes Relative 9 (*) 12 - 46 %   Lymphs Abs 1.0  0.7 - 4.0 K/uL   Monocytes Relative 9  3 - 12 %   Monocytes Absolute 1.0  0.1 - 1.0 K/uL   Eosinophils Relative 1  0 - 5 %   Eosinophils Absolute 0.1  0.0 - 0.7 K/uL   Basophils Relative 0  0 - 1 %   Basophils Absolute 0.0  0.0 - 0.1 K/uL  COMPREHENSIVE METABOLIC PANEL      Status: Abnormal   Collection Time    04/16/14  2:11 AM      Result Value Ref Range   Sodium 138  137 - 147 mEq/L   Potassium 3.3 (*) 3.7 - 5.3 mEq/L   Chloride 95 (*) 96 - 112 mEq/L   CO2 31  19 - 32 mEq/L   Glucose, Bld 127 (*) 70 - 99 mg/dL   BUN 12  6 - 23 mg/dL   Creatinine, Ser 0.67  0.50 - 1.10 mg/dL   Calcium 9.3  8.4 - 10.5 mg/dL   Total Protein 5.9 (*) 6.0 - 8.3 g/dL   Albumin 3.2 (*) 3.5 - 5.2 g/dL   AST 23  0 - 37 U/L   ALT 26  0 - 35 U/L   Alkaline Phosphatase 61  39 - 117 U/L   Total Bilirubin 0.6  0.3 - 1.2 mg/dL   GFR calc non Af Amer 79 (*) >90 mL/min   GFR calc Af Amer >90  >90 mL/min   Comment: (NOTE)     The eGFR has been calculated using the CKD EPI equation.     This calculation has not been validated in all clinical situations.     eGFR's persistently <90 mL/min signify possible Chronic Kidney     Disease.   Anion gap 12  5 - 15   Dg Chest 2 View  04/16/2014   CLINICAL DATA:  Fall, upper back pain.  Shoulder pain.  EXAM: CHEST  2 VIEW  COMPARISON:  Chest radiograph March 03, 2014  FINDINGS: The cardiac silhouette appears moderate to severely enlarged, unchanged. Mildly calcified aortic knob. Descending thoracic aorta stent. Central pulmonary vascular congestion. Increased lung volumes with flattened hemidiaphragm. No pleural effusions or focal consolidation. Similar strandy densities LEFT lung base. No pneumothorax.  Distal RIGHT clavicle fracture appears acute. New mild to moderate proximate T3 compression fracture may be acute. Soft tissue planes are nonsuspicious.  IMPRESSION: Acute RIGHT distal clavicle fracture. Mild to moderate approximate T3 compression fracture may be acute, recommend correlation with point tenderness.  Stable appearance of the chest: Cardiomegaly. Central pulmonary vascular congestion. LEFT lung base atelectasis versus scarring.   Electronically Signed   By: Elon Alas   On: 04/16/2014 01:26   Dg Shoulder Right  04/16/2014    CLINICAL DATA:  Fall, shoulder pain.  EXAM: RIGHT SHOULDER - 2+ VIEW  COMPARISON:  None.  FINDINGS: Comminuted nondisplaced distal RIGHT clavicle fracture. Humeral head is well formed and located. No dislocation. No destructive bony lesions.  Nondisplaced RIGHT lateral second and possibly third rib fractures.  IMPRESSION: Nondisplaced acute appearing distal RIGHT clavicle fracture. No dislocation.  Nondisplaced RIGHT lateral second and possibly third rib fractures.   Electronically Signed   By: Elon Alas   On: 04/16/2014 01:28   Dg Shoulder Left  04/16/2014   CLINICAL DATA:  Fall, pain.  EXAM: LEFT SHOULDER - 2+ VIEW  COMPARISON:  None.  FINDINGS: No acute fracture deformity or dislocation. Joint space intact without erosions. No destructive bony lesions. Soft tissue planes are not suspicious. Remote LEFT lateral rib fractures. In addition, age indeterminate ninth lateral rib fracture.  IMPRESSION: No acute shoulder fracture deformity or dislocation.  Age indeterminate LEFT lateral ninth rib fracture, recommend correlation with point tenderness.   Electronically Signed   By: Elon Alas   On: 04/16/2014 01:29    Review of Systems  Constitutional: Negative.   HENT: Negative.   Respiratory: Positive for shortness of breath (Hurts to take a deep breath).   Gastrointestinal: Negative for abdominal pain.       Upper midline scar  Musculoskeletal: Positive for back pain and falls. Negative for neck pain.       Bilateral shoulder pain  Neurological: Positive for tremors (right hand due to chronic Reglan).    Blood pressure 124/74, pulse 84, temperature 98.3 F (36.8 C), temperature source Oral, resp. rate 15, height  5' (1.524 m), weight 110 lb (49.896 kg), SpO2 95.00%. Physical Exam  Constitutional: She is oriented to person, place, and time.  Thin, elderly female in NAD.  HENT:  Head: Normocephalic and atraumatic.  TMs clear  Eyes: EOM are normal. Pupils are equal, round, and  reactive to light.  Neck: Neck supple.  No C-spine tenderness  Cardiovascular:  Irregular rate  Respiratory: Effort normal and breath sounds normal.  GI: Soft. There is no tenderness.  Musculoskeletal:  Swelling, bruising, tenderness right shoulder.  Tenderness in upper thoracic spine.  Neurological: She is alert and oriented to person, place, and time.  Skin: Skin is warm and dry.  Psychiatric: She has a normal mood and affect. Her behavior is normal.     Assessment/Plan Fall with the following injuries:  1.  Right distal clavicle fracture 2.  Right rib fracture(s) 3.  T3 fracture.  She currently has an incomplete workup. She also has other comorbidities. She has a history of multiple pneumonias as well.  Plan:  Will obtain x-rays of cervical spine, thoracic spine, and lumbar spine. We'll transfer to [place] for admission.  Will consult Orthopedic Surgery (she is a patient of Dr.Gioffre's).  Casimira Sutphin J 04/16/2014, 3:56 AM   Procedures

## 2014-04-16 NOTE — Progress Notes (Signed)
Patient ID: [name], female   DOB: [birth year], 80 y.o.   MRN: [id number]   LOS: 1 day   Subjective: No unexpected c/o.   Objective: Vital signs in last 24 hours: Temp:  [98.3 F (36.8 C)-98.9 F (37.2 C)] 98.9 F (37.2 C) (09/16 0519) Pulse Rate:  [71-84] 79 (09/16 0519) Resp:  [15-21] 16 (09/16 0519) BP: (114-160)/(71-81) 130/74 mmHg (09/16 0519) SpO2:  [91 %-96 %] 95 % (09/16 0519) Weight:  [110 lb (49.896 kg)] 110 lb (49.896 kg) (09/15 2319) Last BM Date: 04/15/14   Laboratory  CBC  Recent Labs  04/16/14 0211  WBC 11.3*  HGB 11.7*  HCT 37.1  PLT 226   BMET  Recent Labs  04/16/14 0211  NA 138  K 3.3*  CL 95*  CO2 31  GLUCOSE 127*  BUN 12  CREATININE 0.67  CALCIUM 9.3    Physical Exam General appearance: alert and no distress Resp: clear to auscultation bilaterally Cardio: regular rate and rhythm GI: normal findings: bowel sounds normal and soft, non-tender   Assessment/Plan: Fall T3 fx -- NS consult Right clav fx -- Awaiting ortho consult, in sling Left rib fx -- Pulmonary toilet Multiple medical problems -- Home meds FEN -- Non-narcotics for pain VTE -- SCD's, Lovenox Dispo -- PT/OT    Lisette Abu, PA-C Pager: [phone] General Trauma PA Pager: [phone]  04/16/2014

## 2014-04-16 NOTE — ED Notes (Signed)
[facility] called for transport. 

## 2014-04-16 NOTE — Consult Note (Signed)
Reason for Consult: C7 TP fracture T1 mild compression deformity Referring Physician: Trauma  [name] is an 80 y.o. female.  HPI: This 80 year old female who ambulates with a walker who is fairly debilitated who had a fall earlier landing on the upper part of her back she is experiencing neck and back pain she is brought to the ER was evaluated plain films raised the question of a T3 compression fracture followup CT scan of her cervical and thoracic spine showed there is no evidence of an acute T3 fracture there was a suspicion of a possible subacute T1 anterior vertebral body fracture and a C7 spinous process fracture as well as multiple vertebra fractures. Patient currently reports some stiffness and soreness in the basilar neck in the upper thoracic spine but she denies any numbness tingling arms or her legs.  Past Medical History  Diagnosis Date  . GERD (gastroesophageal reflux disease)   . Osteoporosis   . Postmenopausal HRT (hormone replacement therapy)   . Anemia   . Allergy   . Asthma   . Shortness of breath   . PONV (postoperative nausea and vomiting)   . Dysrhythmia     a fib  . Chronic bronchitis     "practically q winter" (01/22/2014)  . History of blood transfusion 1993    "related to esophagus removal"  . Arthritis     "legs" (01/06/2014)  . Pneumonia     "all the time; probably 5 times in the last year, counting today" (01/06/2014)  . Aspiration pneumonia 11/22/2013  . H/O hiatal hernia   . Headache(784.0)     "monthly in the past year" (01/22/2014)  . Chronic lower back pain     "maybe cause I fell in the tub on 01/05/2014"  . Urinary frequency   . Esophageal cancer     removed esophagus stomach pulled up; "encapsulated; didn't have to have radiation"  . Basal cell carcinoma of face     "several; freezes them off; face"    Past Surgical History  Procedure Laterality Date  . Left oophorectomy  1952  . Esophageal removal of cancer,gastric ppull-thru 1993     . Forearm fracture surgery Right ?1990's  . Esophagogastroduodenoscopy  10/19/2011    Procedure: ESOPHAGOGASTRODUODENOSCOPY (EGD);  Surgeon: Milus Banister, MD;  Location: [facility];  Service: Endoscopy;  Laterality: N/A;  . Balloon dilation  10/19/2011    Procedure: BALLOON DILATION;  Surgeon: Milus Banister, MD;  Location: [facility] ENDOSCOPY;  Service: Endoscopy;  Laterality: N/A;  . Esophagogastroduodenoscopy  11/07/2011    Procedure: ESOPHAGOGASTRODUODENOSCOPY (EGD);  Surgeon: Ladene Artist, MD,FACG;  Location: [facility] ENDOSCOPY;  Service: Endoscopy;  Laterality: N/A;  . Esophagogastroduodenoscopy  11/22/2011    Procedure: ESOPHAGOGASTRODUODENOSCOPY (EGD);  Surgeon: Inda Castle, MD;  Location: [facility];  Service: Endoscopy;  Laterality: N/A;  . Esophagogastroduodenoscopy  11/24/2011    Procedure: ESOPHAGOGASTRODUODENOSCOPY (EGD);  Surgeon: Inda Castle, MD;  Location: [facility];  Service: Endoscopy;  Laterality: N/A;  with removable duodenal stent (actually using esophageal partially covered 23X15 located in locked supply room  . Duodenal stent placement  11/24/2011    Procedure: DUODENAL STENT PLACEMENT;  Surgeon: Inda Castle, MD;  Location: [facility] ENDOSCOPY;  Service: Endoscopy;  Laterality: N/A;  . Video bronchoscopy Bilateral 01/18/2013    Procedure: VIDEO BRONCHOSCOPY WITHOUT FLUORO;  Surgeon: Tanda Rockers, MD;  Location: [facility] ENDOSCOPY;  Service: Cardiopulmonary;  Laterality: Bilateral;  . Esophagogastroduodenoscopy N/A 02/11/2013    Procedure: ESOPHAGOGASTRODUODENOSCOPY (EGD);  Surgeon: Inda Castle, MD;  Location: [facility];  Service: Endoscopy;  Laterality: N/A;  . Duodenal stent placement N/A 02/11/2013    Procedure: DUODENAL STENT PLACEMENT;  Surgeon: Inda Castle, MD;  Location: [facility] ENDOSCOPY;  Service: Endoscopy;  Laterality: N/A;  . Esophagogastroduodenoscopy N/A 09/11/2013    Procedure: ESOPHAGOGASTRODUODENOSCOPY (EGD);  Surgeon: Gatha Mayer, MD;  Location: [facility]  ENDOSCOPY;  Service: Endoscopy;  Laterality: N/A;  . Cholecystectomy  ~ 1996  . Dilation and curettage of uterus  X 6-7  . Cataract extraction w/ intraocular lens  implant, bilateral Bilateral ~ 2000  . Appendectomy      Family History  Problem Relation Age of Onset  . Cervical cancer Mother   . Heart disease Father     MI  . Coronary artery disease Brother   . Hypotension Sister   . Colon cancer Neg Hx   . Colon polyps Sister   . Pancreatic cancer      1/2 sister  . Coronary artery disease Sister     pacemaker  . Asthma Brother   . Asthma Sister     Social History:  reports that she has never smoked. She has never used smokeless tobacco. She reports that she does not drink alcohol or use illicit drugs.  Allergies:  Allergies  Allergen Reactions  . Ensure [Nutritional Supplements]     "gives me diarrhea"  . Erythromycin Ethylsuccinate     Irregular pulse rate  . Prednisone     "makes me hyper"  . Doxycycline Rash    Medications: I have reviewed the patient's current medications.  Results for orders placed during the hospital encounter of 04/15/14 (from the past 48 hour(s))  CBC WITH DIFFERENTIAL     Status: Abnormal   Collection Time    04/16/14  2:11 AM      Result Value Ref Range   WBC 11.3 (*) 4.0 - 10.5 K/uL   RBC 4.59  3.87 - 5.11 MIL/uL   Hemoglobin 11.7 (*) 12.0 - 15.0 g/dL   HCT 37.1  36.0 - 46.0 %   MCV 80.8  78.0 - 100.0 fL   MCH 25.5 (*) 26.0 - 34.0 pg   MCHC 31.5  30.0 - 36.0 g/dL   RDW 17.0 (*) 11.5 - 15.5 %   Platelets 226  150 - 400 K/uL   Neutrophils Relative % 81 (*) 43 - 77 %   Neutro Abs 9.3 (*) 1.7 - 7.7 K/uL   Lymphocytes Relative 9 (*) 12 - 46 %   Lymphs Abs 1.0  0.7 - 4.0 K/uL   Monocytes Relative 9  3 - 12 %   Monocytes Absolute 1.0  0.1 - 1.0 K/uL   Eosinophils Relative 1  0 - 5 %   Eosinophils Absolute 0.1  0.0 - 0.7 K/uL   Basophils Relative 0  0 - 1 %   Basophils Absolute 0.0  0.0 - 0.1 K/uL  COMPREHENSIVE METABOLIC PANEL      Status: Abnormal   Collection Time    04/16/14  2:11 AM      Result Value Ref Range   Sodium 138  137 - 147 mEq/L   Potassium 3.3 (*) 3.7 - 5.3 mEq/L   Chloride 95 (*) 96 - 112 mEq/L   CO2 31  19 - 32 mEq/L   Glucose, Bld 127 (*) 70 - 99 mg/dL   BUN 12  6 - 23 mg/dL   Creatinine, Ser 0.67  0.50 - 1.10 mg/dL  Calcium 9.3  8.4 - 10.5 mg/dL   Total Protein 5.9 (*) 6.0 - 8.3 g/dL   Albumin 3.2 (*) 3.5 - 5.2 g/dL   AST 23  0 - 37 U/L   ALT 26  0 - 35 U/L   Alkaline Phosphatase 61  39 - 117 U/L   Total Bilirubin 0.6  0.3 - 1.2 mg/dL   GFR calc non Af Amer 79 (*) >90 mL/min   GFR calc Af Amer >90  >90 mL/min   Comment: (NOTE)     The eGFR has been calculated using the CKD EPI equation.     This calculation has not been validated in all clinical situations.     eGFR's persistently <90 mL/min signify possible Chronic Kidney     Disease.   Anion gap 12  5 - 15    Dg Chest 2 View  04/16/2014   CLINICAL DATA:  Fall, upper back pain.  Shoulder pain.  EXAM: CHEST  2 VIEW  COMPARISON:  Chest radiograph March 03, 2014  FINDINGS: The cardiac silhouette appears moderate to severely enlarged, unchanged. Mildly calcified aortic knob. Descending thoracic aorta stent. Central pulmonary vascular congestion. Increased lung volumes with flattened hemidiaphragm. No pleural effusions or focal consolidation. Similar strandy densities LEFT lung base. No pneumothorax.  Distal RIGHT clavicle fracture appears acute. New mild to moderate proximate T3 compression fracture may be acute. Soft tissue planes are nonsuspicious.  IMPRESSION: Acute RIGHT distal clavicle fracture. Mild to moderate approximate T3 compression fracture may be acute, recommend correlation with point tenderness.  Stable appearance of the chest: Cardiomegaly. Central pulmonary vascular congestion. LEFT lung base atelectasis versus scarring.   Electronically Signed   By: Elon Alas   On: 04/16/2014 01:26   Dg Cervical Spine  Complete  04/16/2014   ADDENDUM REPORT: 04/16/2014 07:51  ADDENDUM: These results were called by telephone at the time of interpretation on 04/16/2014 at 7:51 am to Dr. Judeth Horn, who verbally acknowledged these results.   Electronically Signed   By: Lowella Grip M.D.   On: 04/16/2014 07:51   04/16/2014   CLINICAL DATA:  Pain post trauma  EXAM: CERVICAL SPINE  4+ VIEWS  COMPARISON:  None.  FINDINGS: Frontal, lateral, spot open-mouth odontoid, and bilateral oblique views were obtained. There is no demonstrable fracture. There is 4 mm of anterolisthesis of C4 on C5. There is 2 mm of anterolisthesis of C5 on C6. These areas of spondylolisthesis are probably secondary to underlying spondylosis. Prevertebral soft tissues and predental space regions appear within normal limits.  There is marked disc space narrowing at C5-6 and C6-7. There is milder disc space narrowing at C4-5 and C7-T1. There is facet osteoarthritic change at C4-5, C5-6, and C6-7 bilaterally. There is reversal of lordotic curvature which may well be secondary to muscle spasm.  IMPRESSION: There is spondylolisthesis at C4-5 and C5-6. These changes are probably due to underlying spondylosis. In the acute trauma setting, however, CT is advised to confirm this observation. There is extensive osteoarthritic change. There is reversal lordotic curvature which may well be secondary to muscle spasm.  Electronically Signed: By: Lowella Grip M.D. On: 04/16/2014 07:46   Dg Thoracic Spine 2 View  04/16/2014   ADDENDUM REPORT: 04/16/2014 08:46  ADDENDUM: The T3 vertebral body abnormality described on comparison chest x-ray is not well imaged due to kyphosis and overlying soft tissues and bone tissue. If concern for acute fracture, recommend CT of the upper thoracic spine.   Electronically Signed  By: Suzy Bouchard M.D.   On: 04/16/2014 08:46   04/16/2014   CLINICAL DATA:  Fall, back and neck pain  EXAM: THORACIC SPINE - 2 VIEW  COMPARISON:  None.   FINDINGS: There is no acute loss of vertebral body height or disc height in the thoracic spine. Scoliosis is noted. Normal paraspinal lines. The heart is enlarged. There is chronic bronchitic markings in the lung. Aortic stent graft noted.  IMPRESSION: No acute findings of the thoracic spine.  Electronically Signed: By: Suzy Bouchard M.D. On: 04/16/2014 07:46   Dg Lumbar Spine Complete  04/16/2014   CLINICAL DATA:  Pain post trauma  EXAM: LUMBAR SPINE - COMPLETE 4+ VIEW  COMPARISON:  January 06, 2014  FINDINGS: Frontal, lateral, spot lumbosacral lateral, and bilateral oblique views were obtained. There is a transitional L5 vertebra with an assimilation joint on the left. There is no demonstrable fracture. There is stable 10 mm of anterolisthesis of L4 on L5. There is no new spondylolisthesis. There is moderate disc space narrowing at L4-5 and L5-S1. There is milder disc space narrowing at L3-4 and L2-3. There is facet osteoarthritic change at all levels bilaterally, most notably at L4-5 and L5-S1 bilaterally. There is a stent in the aorta, stable.  IMPRESSION: No fracture apparent. Stable spondylolisthesis at L4-5. No new spondylolisthesis. Multilevel osteoarthritic change, stable.   Electronically Signed   By: Lowella Grip M.D.   On: 04/16/2014 07:50   Dg Shoulder Right  04/16/2014   CLINICAL DATA:  Fall, shoulder pain.  EXAM: RIGHT SHOULDER - 2+ VIEW  COMPARISON:  None.  FINDINGS: Comminuted nondisplaced distal RIGHT clavicle fracture. Humeral head is well formed and located. No dislocation. No destructive bony lesions.  Nondisplaced RIGHT lateral second and possibly third rib fractures.  IMPRESSION: Nondisplaced acute appearing distal RIGHT clavicle fracture. No dislocation.  Nondisplaced RIGHT lateral second and possibly third rib fractures.   Electronically Signed   By: Elon Alas   On: 04/16/2014 01:28   Ct Chest Wo Contrast  04/16/2014   CLINICAL DATA:  Possible T3 fracture.  EXAM: CT  CHEST WITHOUT CONTRAST  TECHNIQUE: Multidetector CT imaging of the chest was performed following the standard protocol without IV contrast.  COMPARISON:  Thoracic spine radiographs 04/16/2014  FINDINGS: Normal alignment of the thoracic vertebral bodies. No acute fracture is identified. No bone lesions. The facets are normally aligned.  There are right-sided fourth, fifth and sixth posterior rib fractures. The fifth and sixth ribs fractures are displaced. There is associated pleural fluid/ hematoma but no pneumothorax. No definite left-sided rib fractures.  There is a distal right clavicle fracture. The glenohumeral joint is maintained.  There are surgical changes from a gastric pull-through procedure with a distal metallic stent. No acute pulmonary findings. The heart is enlarged but stable. There is tortuosity and calcification of the thoracic aorta. The upper abdomen is unremarkable.  IMPRESSION: 1. Right distal clavicle fracture.  The [facility] joint is intact. 2. Right fourth, fifth and sixth posterior rib fractures. The fifth and sixth rib fractures are displaced. No pneumothorax. 3. Normal alignment of the thoracic vertebral bodies and no acute thoracic spine compression fracture. 4. Surgical changes related to a gastric pull-through procedure with a distal stent.   Electronically Signed   By: Kalman Jewels M.D.   On: 04/16/2014 15:07   Ct Cervical Spine Wo Contrast  04/16/2014   CLINICAL DATA:  Followup fall. Evaluate right distal clavicle fracture.  EXAM: CT CERVICAL SPINE WITHOUT CONTRAST  TECHNIQUE: Multidetector CT imaging of the cervical spine was performed without intravenous contrast. Multiplanar CT image reconstructions were also generated.  COMPARISON:  01/06/2014 and plain films 04/16/2014  FINDINGS: Examination demonstrates moderate reversal of normal cervical lordosis. There is subtle stair step anterior subluxation of 1-2 mm of C3 on C4 and C4 on C5 without significant change. There is mild  spondylosis throughout the cervical spine. There is moderate disc space narrowing at the C5-6 and C6-7 levels unchanged. There is mild depression of the superior endplate of T1 which is a new finding. There is a subtle oblique lucency through the anterior aspect of the T1 vertebral body seen only on the sagittal reformatted images as this may be artifactual, although cannot exclude a fracture. Prevertebral soft tissues as well as the atlantoaxial articulation are within normal. There is uncovertebral joint spurring and facet arthropathy.  Exam demonstrates a minimally displaced posterior right second rib fracture and nondisplaced posterior medial right first rib fracture. There is a fracture of the left C7 transverse process which appears subacute in nature.  No evidence pneumothorax. Evidence of patient's gastric pull-through procedure.  IMPRESSION: Acute minimally displaced fracture of the right posterior second rib and nondisplaced posterior medial right first rib fracture. Subtle nondisplaced fracture of the left C7 transverse process which appears subacute in nature.  Mild spondylosis of the cervical spine with disc disease at the C5-6 and C6-7 levels. Moderate reversal of the normal cervical lordosis. Subtle anterior stairstep subluxation of 1-2 mm of C3 on C4 and C4 on C5 unchanged likely degenerative in nature.  New mild depression of the superior endplate of T1 suggesting acute to subacute fracture. Suggestion of an oblique lucency through the anterior aspect of the T1 vertebral body seen only on the sagittal reformatted images which may be artifactual. Consider MRI for further evaluation.  These results were called by telephone at the time of interpretation on 04/16/2014 at 3:36 pm to Dr. Silvestre Gunner , who verbally acknowledged these results.   Electronically Signed   By: Marin Olp M.D.   On: 04/16/2014 15:37   Dg Shoulder Left  04/16/2014   CLINICAL DATA:  Fall, pain.  EXAM: LEFT SHOULDER - 2+  VIEW  COMPARISON:  None.  FINDINGS: No acute fracture deformity or dislocation. Joint space intact without erosions. No destructive bony lesions. Soft tissue planes are not suspicious. Remote LEFT lateral rib fractures. In addition, age indeterminate ninth lateral rib fracture.  IMPRESSION: No acute shoulder fracture deformity or dislocation.  Age indeterminate LEFT lateral ninth rib fracture, recommend correlation with point tenderness.   Electronically Signed   By: Elon Alas   On: 04/16/2014 01:29    Review of Systems  Constitutional: Negative.   Eyes: Negative.   Respiratory: Negative.   Cardiovascular: Negative.   Gastrointestinal: Negative.   Genitourinary: Negative.   Musculoskeletal: Positive for back pain, myalgias and neck pain.  Skin: Negative.   Neurological: Positive for sensory change.  Psychiatric/Behavioral: Negative.    Blood pressure 121/81, pulse 83, temperature 99.1 F (37.3 C), temperature source Oral, resp. rate 18, height 5' (1.524 m), weight 49.896 kg (110 lb), SpO2 94.00%. Physical Exam  Constitutional: She is oriented to person, place, and time. She appears well-nourished.  HENT:  Head: Normocephalic.  Eyes: Pupils are equal, round, and reactive to light.  Neck: Normal range of motion.  GI: Soft.  Musculoskeletal: Normal range of motion.  Neurological: She is alert and oriented to person, place, and time. She has normal strength.  GCS eye subscore is 4. GCS verbal subscore is 5. GCS motor subscore is 6.  Patient is awake alert oriented strength is 5 out of 5 in her upper and lower extremities her neck has some mild tenderness mostly paraspinal as well as her mid thoracic spine  Skin: Skin is warm and dry.    Assessment/Plan: 80 year old female with a C7 transverse process fracture mild compression deformity of T1 possible unclear in very difficult to interpret on the CT scan secondary to patient's kyphotic deformity and degenerative disease. Regardless I  do not think the patient needs to be braced either with a collar or thoracic bracing she mobilize with physical occupational therapy. If the patient's pain progresses followup MRI scan would be indicated. Otherwise no orthosis needed at this time.  Canaan Holzer P 04/16/2014, 4:56 PM

## 2014-04-17 ENCOUNTER — Inpatient Hospital Stay (HOSPITAL_COMMUNITY): Payer: Medicare Other

## 2014-04-17 DIAGNOSIS — S129XXA Fracture of neck, unspecified, initial encounter: Secondary | ICD-10-CM | POA: Diagnosis present

## 2014-04-17 LAB — BASIC METABOLIC PANEL
ANION GAP: 11 (ref 5–15)
BUN: 11 mg/dL (ref 6–23)
CALCIUM: 8.4 mg/dL (ref 8.4–10.5)
CO2: 31 meq/L (ref 19–32)
Chloride: 95 mEq/L — ABNORMAL LOW (ref 96–112)
Creatinine, Ser: 0.63 mg/dL (ref 0.50–1.10)
GFR calc non Af Amer: 81 mL/min — ABNORMAL LOW (ref >90)
Glucose, Bld: 124 mg/dL — ABNORMAL HIGH (ref 70–99)
Potassium: 3.6 mEq/L — ABNORMAL LOW (ref 3.7–5.3)
SODIUM: 137 meq/L (ref 137–147)

## 2014-04-17 LAB — PROTIME-INR
INR: 1.08 (ref 0.00–1.49)
PROTHROMBIN TIME: 14 s (ref 11.6–15.2)

## 2014-04-17 LAB — CBC
HEMATOCRIT: 37.4 % (ref 36.0–46.0)
Hemoglobin: 12.1 g/dL (ref 12.0–15.0)
MCH: 25.9 pg — ABNORMAL LOW (ref 26.0–34.0)
MCHC: 32.4 g/dL (ref 30.0–36.0)
MCV: 79.9 fL (ref 78.0–100.0)
Platelets: 212 10*3/uL (ref 150–400)
RBC: 4.68 MIL/uL (ref 3.87–5.11)
RDW: 16.9 % — AB (ref 11.5–15.5)
WBC: 10.1 10*3/uL (ref 4.0–10.5)

## 2014-04-17 MED ORDER — HYDROCODONE-ACETAMINOPHEN 5-325 MG PO TABS
0.5000 | ORAL_TABLET | ORAL | Status: DC | PRN
  Administered 2014-04-17 (×2): 1 via ORAL
  Administered 2014-04-17 – 2014-04-18 (×3): 2 via ORAL
  Filled 2014-04-17: qty 1
  Filled 2014-04-17 (×2): qty 2
  Filled 2014-04-17: qty 1
  Filled 2014-04-17: qty 2

## 2014-04-17 MED ORDER — TRAMADOL HCL 50 MG PO TABS
100.0000 mg | ORAL_TABLET | Freq: Four times a day (QID) | ORAL | Status: DC
  Administered 2014-04-17 – 2014-04-22 (×17): 100 mg via ORAL
  Filled 2014-04-17 (×19): qty 2

## 2014-04-17 NOTE — Progress Notes (Signed)
Patient ID: OWEN PAGNOTTA, female   DOB: 25-Mar-1929, 80 y.o.   MRN: [id number]   LOS: 2 days   Subjective: Weak and nauseated this morning. Tramadol not effective.   Objective: Vital signs in last 24 hours: Temp:  [98.7 F (37.1 C)-99.1 F (37.3 C)] 98.7 F (37.1 C) (09/17 0537) Pulse Rate:  [78-83] 78 (09/17 0537) Resp:  [18] 18 (09/17 0537) BP: (121-128)/(76-81) 122/78 mmHg (09/17 0537) SpO2:  [94 %-95 %] 95 % (09/17 0537) Last BM Date: 04/15/14   IS: <138ml   Radiology Results PORTABLE CHEST - 1 VIEW  COMPARISON: CT scan of the chest 04/16/2014  FINDINGS:  Acute displaced fractures of multiple right-sided ribs including  ribs 1, 2, 3, 4, 5 and 6. Minimally displaced fracture of the distal  right clavicle. No pneumothorax. Trace pleural effusion. Stable  cardiomegaly. Large hiatal hernia with overlapping esophageal  stents. Atherosclerotic calcification in the aorta. Left basilar  atelectasis versus infiltrate. Stable chronic bronchitic changes and  minimal interstitial prominence.  IMPRESSION:  1. Acute displaced fractures of the right first through the sixth  ribs. The first 3 rib fractures are more evident on today's  examination compared to the CT scan of the chest from 04/16/2014. No  pneumothorax or significant hemothorax.  2. Mildly displaced fracture of the distal aspect of the right  clavicle.  3. Large hiatal hernia with overlapping esophageal stents in place.  4. Left basilar atelectasis versus infiltrate appear similar  compared to prior. No acute cardiopulmonary process.  Electronically Signed  By: Jacqulynn Cadet M.D.  On: 04/17/2014 08:04   Physical Exam General appearance: alert and no distress Resp: clear to auscultation bilaterally Cardio: regular rate and rhythm GI: normal findings: soft, non-tender and bowel sounds diminished   Assessment/Plan: Fall  C7 TVP fx/T1 fx -- No treatment necessary per NS Right clav fx -- Awaiting ortho  consult, in sling  Multiple right rib fxs -- Pulmonary toilet  Multiple medical problems -- Home meds  FEN -- Pain not controlled, schedule tramadol and try Norco VTE -- SCD's, Lovenox  Dispo -- PT/OT    Lisette Abu, PA-C Pager: [phone] General Trauma PA Pager: [phone]  04/17/2014

## 2014-04-17 NOTE — Progress Notes (Signed)
Rehab Admissions Coordinator Note:  Patient was screened by Retta Diones for appropriateness for an Inpatient Acute Rehab Consult.  OT recommending CIR.  At this time, we are recommending Inpatient Rehab consult.  Retta Diones 04/17/2014, 3:31 PM  I can be reached at [phone].

## 2014-04-17 NOTE — Progress Notes (Signed)
Does not do well with IS but has a pretty good cough. I discussed the importance of pulmonary toilet and she understands. If her lungs get bad, she does want to go on the ventilator.  Continue therapies. Patient examined and I agree with the assessment and plan  Georganna Skeans, MD, MPH, FACS Trauma: [phone] General Surgery: [phone]  04/17/2014 11:22 AM

## 2014-04-17 NOTE — Consult Note (Signed)
Reason for Consult:Fracture of Right distal Clavicle Referring Physician: Dr.Yao  [name] is an 80 y.o. female.  HPI: Patient fell at home when she tripped over [name] Carpet.  Past Medical History  Diagnosis Date  . GERD (gastroesophageal reflux disease)   . Osteoporosis   . Postmenopausal HRT (hormone replacement therapy)   . Anemia   . Allergy   . Asthma   . Shortness of breath   . PONV (postoperative nausea and vomiting)   . Dysrhythmia     [name] fib  . Chronic bronchitis     "practically q winter" (01/22/2014)  . History of blood transfusion 1993    "related to esophagus removal"  . Arthritis     "legs" (01/06/2014)  . Pneumonia     "all the time; probably 5 times in the last year, counting today" (01/06/2014)  . Aspiration pneumonia 11/22/2013  . H/O hiatal hernia   . Headache(784.0)     "monthly in the past year" (01/22/2014)  . Chronic lower back pain     "maybe cause I fell in the tub on 01/05/2014"  . Urinary frequency   . Esophageal cancer     removed esophagus stomach pulled up; "encapsulated; didn't have to have radiation"  . Basal cell carcinoma of face     "several; freezes them off; face"    Past Surgical History  Procedure Laterality Date  . Left oophorectomy  1952  . Esophageal removal of cancer,gastric ppull-thru 1993    . Forearm fracture surgery Right ?1990's  . Esophagogastroduodenoscopy  10/19/2011    Procedure: ESOPHAGOGASTRODUODENOSCOPY (EGD);  Surgeon: Milus Banister, MD;  Location: [facility];  Service: Endoscopy;  Laterality: N/[name];  . Balloon dilation  10/19/2011    Procedure: BALLOON DILATION;  Surgeon: Milus Banister, MD;  Location: [facility];  Service: Endoscopy;  Laterality: N/[name];  . Esophagogastroduodenoscopy  11/07/2011    Procedure: ESOPHAGOGASTRODUODENOSCOPY (EGD);  Surgeon: Ladene Artist, MD,FACG;  Location: [facility] ENDOSCOPY;  Service: Endoscopy;  Laterality: N/[name];  . Esophagogastroduodenoscopy  11/22/2011    Procedure:  ESOPHAGOGASTRODUODENOSCOPY (EGD);  Surgeon: Inda Castle, MD;  Location: [facility];  Service: Endoscopy;  Laterality: N/[name];  . Esophagogastroduodenoscopy  11/24/2011    Procedure: ESOPHAGOGASTRODUODENOSCOPY (EGD);  Surgeon: Inda Castle, MD;  Location: [facility];  Service: Endoscopy;  Laterality: N/[name];  with removable duodenal stent (actually using esophageal partially covered 23X15 located in locked supply room  . Duodenal stent placement  11/24/2011    Procedure: DUODENAL STENT PLACEMENT;  Surgeon: Inda Castle, MD;  Location: [facility];  Service: Endoscopy;  Laterality: N/[name];  . Video bronchoscopy Bilateral 01/18/2013    Procedure: VIDEO BRONCHOSCOPY WITHOUT FLUORO;  Surgeon: Tanda Rockers, MD;  Location: [facility];  Service: Cardiopulmonary;  Laterality: Bilateral;  . Esophagogastroduodenoscopy N/[name] 02/11/2013    Procedure: ESOPHAGOGASTRODUODENOSCOPY (EGD);  Surgeon: Inda Castle, MD;  Location: [facility];  Service: Endoscopy;  Laterality: N/[name];  . Duodenal stent placement N/[name] 02/11/2013    Procedure: DUODENAL STENT PLACEMENT;  Surgeon: Inda Castle, MD;  Location: [facility];  Service: Endoscopy;  Laterality: N/[name];  . Esophagogastroduodenoscopy N/[name] 09/11/2013    Procedure: ESOPHAGOGASTRODUODENOSCOPY (EGD);  Surgeon: Gatha Mayer, MD;  Location: [facility];  Service: Endoscopy;  Laterality: N/[name];  . Cholecystectomy  ~ 1996  . Dilation and curettage of uterus  X 6-7  . Cataract extraction w/ intraocular lens  implant, bilateral Bilateral ~ 2000  . Appendectomy      Family History  Problem Relation Age of Onset  . Cervical cancer Mother   . Heart disease Father     MI  . Coronary artery disease Brother   . Hypotension Sister   . Colon cancer Neg Hx   . Colon polyps Sister   . Pancreatic cancer      1/2 sister  . Coronary artery disease Sister     pacemaker  . Asthma Brother   . Asthma Sister     Social History:  reports that she has never smoked. She has  never used smokeless tobacco. She reports that she does not drink alcohol or use illicit drugs.  Allergies:  Allergies  Allergen Reactions  . Ensure [Nutritional Supplements]     "gives me diarrhea"  . Erythromycin Ethylsuccinate     Irregular pulse rate  . Prednisone     "makes me hyper"  . Doxycycline Rash    Medications: I have reviewed the patient's current medications.  Results for orders placed during the hospital encounter of 04/15/14 (from the past 48 hour(s))  CBC WITH DIFFERENTIAL     Status: Abnormal   Collection Time    04/16/14  2:11 AM      Result Value Ref Range   WBC 11.3 (*) 4.0 - 10.5 K/uL   RBC 4.59  3.87 - 5.11 MIL/uL   Hemoglobin 11.7 (*) 12.0 - 15.0 g/dL   HCT 37.1  36.0 - 46.0 %   MCV 80.8  78.0 - 100.0 fL   MCH 25.5 (*) 26.0 - 34.0 pg   MCHC 31.5  30.0 - 36.0 g/dL   RDW 17.0 (*) 11.5 - 15.5 %   Platelets 226  150 - 400 K/uL   Neutrophils Relative % 81 (*) 43 - 77 %   Neutro Abs 9.3 (*) 1.7 - 7.7 K/uL   Lymphocytes Relative 9 (*) 12 - 46 %   Lymphs Abs 1.0  0.7 - 4.0 K/uL   Monocytes Relative 9  3 - 12 %   Monocytes Absolute 1.0  0.1 - 1.0 K/uL   Eosinophils Relative 1  0 - 5 %   Eosinophils Absolute 0.1  0.0 - 0.7 K/uL   Basophils Relative 0  0 - 1 %   Basophils Absolute 0.0  0.0 - 0.1 K/uL  COMPREHENSIVE METABOLIC PANEL     Status: Abnormal   Collection Time    04/16/14  2:11 AM      Result Value Ref Range   Sodium 138  137 - 147 mEq/L   Potassium 3.3 (*) 3.7 - 5.3 mEq/L   Chloride 95 (*) 96 - 112 mEq/L   CO2 31  19 - 32 mEq/L   Glucose, Bld 127 (*) 70 - 99 mg/dL   BUN 12  6 - 23 mg/dL   Creatinine, Ser 0.67  0.50 - 1.10 mg/dL   Calcium 9.3  8.4 - 10.5 mg/dL   Total Protein 5.9 (*) 6.0 - 8.3 g/dL   Albumin 3.2 (*) 3.5 - 5.2 g/dL   AST 23  0 - 37 U/L   ALT 26  0 - 35 U/L   Alkaline Phosphatase 61  39 - 117 U/L   Total Bilirubin 0.6  0.3 - 1.2 mg/dL   GFR calc non Af Amer 79 (*) >90 mL/min   GFR calc Af Amer >90  >90 mL/min   Comment:  (NOTE)     The eGFR has been calculated using the CKD EPI equation.     This calculation has  not been validated in all clinical situations.     eGFR's persistently <90 mL/min signify possible Chronic Kidney     Disease.   Anion gap 12  5 - 15  CBC     Status: Abnormal   Collection Time    04/16/14 11:44 PM      Result Value Ref Range   WBC 10.1  4.0 - 10.5 K/uL   RBC 4.68  3.87 - 5.11 MIL/uL   Hemoglobin 12.1  12.0 - 15.0 g/dL   HCT 37.4  36.0 - 46.0 %   MCV 79.9  78.0 - 100.0 fL   MCH 25.9 (*) 26.0 - 34.0 pg   MCHC 32.4  30.0 - 36.0 g/dL   RDW 16.9 (*) 11.5 - 15.5 %   Platelets 212  150 - 400 K/uL  PROTIME-INR     Status: None   Collection Time    04/16/14 11:44 PM      Result Value Ref Range   Prothrombin Time 14.0  11.6 - 15.2 seconds   INR 1.08  0.00 - 2.67  BASIC METABOLIC PANEL     Status: Abnormal   Collection Time    04/16/14 11:49 PM      Result Value Ref Range   Sodium 137  137 - 147 mEq/L   Potassium 3.6 (*) 3.7 - 5.3 mEq/L   Chloride 95 (*) 96 - 112 mEq/L   CO2 31  19 - 32 mEq/L   Glucose, Bld 124 (*) 70 - 99 mg/dL   BUN 11  6 - 23 mg/dL   Creatinine, Ser 0.63  0.50 - 1.10 mg/dL   Calcium 8.4  8.4 - 10.5 mg/dL   GFR calc non Af Amer 81 (*) >90 mL/min   GFR calc Af Amer >90  >90 mL/min   Comment: (NOTE)     The eGFR has been calculated using the CKD EPI equation.     This calculation has not been validated in all clinical situations.     eGFR's persistently <90 mL/min signify possible Chronic Kidney     Disease.   Anion gap 11  5 - 15    Dg Chest 2 View  04/16/2014   CLINICAL DATA:  Fall, upper back pain.  Shoulder pain.  EXAM: CHEST  2 VIEW  COMPARISON:  Chest radiograph March 03, 2014  FINDINGS: The cardiac silhouette appears moderate to severely enlarged, unchanged. Mildly calcified aortic knob. Descending thoracic aorta stent. Central pulmonary vascular congestion. Increased lung volumes with flattened hemidiaphragm. No pleural effusions or focal  consolidation. Similar strandy densities LEFT lung base. No pneumothorax.  Distal RIGHT clavicle fracture appears acute. New mild to moderate proximate T3 compression fracture may be acute. Soft tissue planes are nonsuspicious.  IMPRESSION: Acute RIGHT distal clavicle fracture. Mild to moderate approximate T3 compression fracture may be acute, recommend correlation with point tenderness.  Stable appearance of the chest: Cardiomegaly. Central pulmonary vascular congestion. LEFT lung base atelectasis versus scarring.   Electronically Signed   By: Elon Alas   On: 04/16/2014 01:26   Dg Cervical Spine Complete  04/16/2014   ADDENDUM REPORT: 04/16/2014 07:51  ADDENDUM: These results were called by telephone at the time of interpretation on 04/16/2014 at 7:51 am to Dr. Judeth Horn, who verbally acknowledged these results.   Electronically Signed   By: Lowella Grip M.D.   On: 04/16/2014 07:51   04/16/2014   CLINICAL DATA:  Pain post trauma  EXAM: CERVICAL SPINE  4+ VIEWS  COMPARISON:  None.  FINDINGS: Frontal, lateral, spot open-mouth odontoid, and bilateral oblique views were obtained. There is no demonstrable fracture. There is 4 mm of anterolisthesis of C4 on C5. There is 2 mm of anterolisthesis of C5 on C6. These areas of spondylolisthesis are probably secondary to underlying spondylosis. Prevertebral soft tissues and predental space regions appear within normal limits.  There is marked disc space narrowing at C5-6 and C6-7. There is milder disc space narrowing at C4-5 and C7-T1. There is facet osteoarthritic change at C4-5, C5-6, and C6-7 bilaterally. There is reversal of lordotic curvature which may well be secondary to muscle spasm.  IMPRESSION: There is spondylolisthesis at C4-5 and C5-6. These changes are probably due to underlying spondylosis. In the acute trauma setting, however, CT is advised to confirm this observation. There is extensive osteoarthritic change. There is reversal lordotic  curvature which may well be secondary to muscle spasm.  Electronically Signed: By: Bretta Bang M.D. On: 04/16/2014 07:46   Dg Thoracic Spine 2 View  04/16/2014   ADDENDUM REPORT: 04/16/2014 08:46  ADDENDUM: The T3 vertebral body abnormality described on comparison chest x-ray is not well imaged due to kyphosis and overlying soft tissues and bone tissue. If concern for acute fracture, recommend CT of the upper thoracic spine.   Electronically Signed   By: Genevive Bi M.D.   On: 04/16/2014 08:46   04/16/2014   CLINICAL DATA:  Fall, back and neck pain  EXAM: THORACIC SPINE - 2 VIEW  COMPARISON:  None.  FINDINGS: There is no acute loss of vertebral body height or disc height in the thoracic spine. Scoliosis is noted. Normal paraspinal lines. The heart is enlarged. There is chronic bronchitic markings in the lung. Aortic stent graft noted.  IMPRESSION: No acute findings of the thoracic spine.  Electronically Signed: By: Genevive Bi M.D. On: 04/16/2014 07:46   Dg Lumbar Spine Complete  04/16/2014   CLINICAL DATA:  Pain post trauma  EXAM: LUMBAR SPINE - COMPLETE 4+ VIEW  COMPARISON:  January 06, 2014  FINDINGS: Frontal, lateral, spot lumbosacral lateral, and bilateral oblique views were obtained. There is [name] transitional L5 vertebra with an assimilation joint on the left. There is no demonstrable fracture. There is stable 10 mm of anterolisthesis of L4 on L5. There is no new spondylolisthesis. There is moderate disc space narrowing at L4-5 and L5-S1. There is milder disc space narrowing at L3-4 and L2-3. There is facet osteoarthritic change at all levels bilaterally, most notably at L4-5 and L5-S1 bilaterally. There is [name] stent in the aorta, stable.  IMPRESSION: No fracture apparent. Stable spondylolisthesis at L4-5. No new spondylolisthesis. Multilevel osteoarthritic change, stable.   Electronically Signed   By: Bretta Bang M.D.   On: 04/16/2014 07:50   Dg Shoulder Right  04/16/2014   CLINICAL  DATA:  Fall, shoulder pain.  EXAM: RIGHT SHOULDER - 2+ VIEW  COMPARISON:  None.  FINDINGS: Comminuted nondisplaced distal RIGHT clavicle fracture. Humeral head is well formed and located. No dislocation. No destructive bony lesions.  Nondisplaced RIGHT lateral second and possibly third rib fractures.  IMPRESSION: Nondisplaced acute appearing distal RIGHT clavicle fracture. No dislocation.  Nondisplaced RIGHT lateral second and possibly third rib fractures.   Electronically Signed   By: Awilda Metro   On: 04/16/2014 01:28   Ct Chest Wo Contrast  04/16/2014   CLINICAL DATA:  Possible T3 fracture.  EXAM: CT CHEST WITHOUT CONTRAST  TECHNIQUE: Multidetector CT imaging of the chest was performed  following the standard protocol without IV contrast.  COMPARISON:  Thoracic spine radiographs 04/16/2014  FINDINGS: Normal alignment of the thoracic vertebral bodies. No acute fracture is identified. No bone lesions. The facets are normally aligned.  There are right-sided fourth, fifth and sixth posterior rib fractures. The fifth and sixth ribs fractures are displaced. There is associated pleural fluid/ hematoma but no pneumothorax. No definite left-sided rib fractures.  There is [name] distal right clavicle fracture. The glenohumeral joint is maintained.  There are surgical changes from [name] gastric pull-through procedure with [name] distal metallic stent. No acute pulmonary findings. The heart is enlarged but stable. There is tortuosity and calcification of the thoracic aorta. The upper abdomen is unremarkable.  IMPRESSION: 1. Right distal clavicle fracture.  The [facility] joint is intact. 2. Right fourth, fifth and sixth posterior rib fractures. The fifth and sixth rib fractures are displaced. No pneumothorax. 3. Normal alignment of the thoracic vertebral bodies and no acute thoracic spine compression fracture. 4. Surgical changes related to [name] gastric pull-through procedure with [name] distal stent.   Electronically Signed   By: Kalman Jewels  M.D.   On: 04/16/2014 15:07   Ct Cervical Spine Wo Contrast  04/16/2014   CLINICAL DATA:  Followup fall. Evaluate right distal clavicle fracture.  EXAM: CT CERVICAL SPINE WITHOUT CONTRAST  TECHNIQUE: Multidetector CT imaging of the cervical spine was performed without intravenous contrast. Multiplanar CT image reconstructions were also generated.  COMPARISON:  01/06/2014 and plain films 04/16/2014  FINDINGS: Examination demonstrates moderate reversal of normal cervical lordosis. There is subtle stair step anterior subluxation of 1-2 mm of C3 on C4 and C4 on C5 without significant change. There is mild spondylosis throughout the cervical spine. There is moderate disc space narrowing at the C5-6 and C6-7 levels unchanged. There is mild depression of the superior endplate of T1 which is [name] new finding. There is [name] subtle oblique lucency through the anterior aspect of the T1 vertebral body seen only on the sagittal reformatted images as this may be artifactual, although cannot exclude [name] fracture. Prevertebral soft tissues as well as the atlantoaxial articulation are within normal. There is uncovertebral joint spurring and facet arthropathy.  Exam demonstrates [name] minimally displaced posterior right second rib fracture and nondisplaced posterior medial right first rib fracture. There is [name] fracture of the left C7 transverse process which appears subacute in nature.  No evidence pneumothorax. Evidence of patient's gastric pull-through procedure.  IMPRESSION: Acute minimally displaced fracture of the right posterior second rib and nondisplaced posterior medial right first rib fracture. Subtle nondisplaced fracture of the left C7 transverse process which appears subacute in nature.  Mild spondylosis of the cervical spine with disc disease at the C5-6 and C6-7 levels. Moderate reversal of the normal cervical lordosis. Subtle anterior stairstep subluxation of 1-2 mm of C3 on C4 and C4 on C5 unchanged likely degenerative in  nature.  New mild depression of the superior endplate of T1 suggesting acute to subacute fracture. Suggestion of an oblique lucency through the anterior aspect of the T1 vertebral body seen only on the sagittal reformatted images which may be artifactual. Consider MRI for further evaluation.  These results were called by telephone at the time of interpretation on 04/16/2014 at 3:36 pm to Dr. Silvestre Gunner , who verbally acknowledged these results.   Electronically Signed   By: Marin Olp M.D.   On: 04/16/2014 15:37   Dg Chest Port 1 View  04/17/2014   CLINICAL DATA:  Evaluate rib  fracture  EXAM: PORTABLE CHEST - 1 VIEW  COMPARISON:  CT scan of the chest 04/16/2014  FINDINGS: Acute displaced fractures of multiple right-sided ribs including ribs 1, 2, 3, 4, 5 and 6. Minimally displaced fracture of the distal right clavicle. No pneumothorax. Trace pleural effusion. Stable cardiomegaly. Large hiatal hernia with overlapping esophageal stents. Atherosclerotic calcification in the aorta. Left basilar atelectasis versus infiltrate. Stable chronic bronchitic changes and minimal interstitial prominence.  IMPRESSION: 1. Acute displaced fractures of the right first through the sixth ribs. The first 3 rib fractures are more evident on today's examination compared to the CT scan of the chest from 04/16/2014. No pneumothorax or significant hemothorax. 2. Mildly displaced fracture of the distal aspect of the right clavicle. 3. Large hiatal hernia with overlapping esophageal stents in place. 4. Left basilar atelectasis versus infiltrate appear similar compared to prior. No acute cardiopulmonary process.   Electronically Signed   By: Jacqulynn Cadet M.D.   On: 04/17/2014 08:04   Dg Shoulder Left  04/16/2014   CLINICAL DATA:  Fall, pain.  EXAM: LEFT SHOULDER - 2+ VIEW  COMPARISON:  None.  FINDINGS: No acute fracture deformity or dislocation. Joint space intact without erosions. No destructive bony lesions. Soft tissue  planes are not suspicious. Remote LEFT lateral rib fractures. In addition, age indeterminate ninth lateral rib fracture.  IMPRESSION: No acute shoulder fracture deformity or dislocation.  Age indeterminate LEFT lateral ninth rib fracture, recommend correlation with point tenderness.   Electronically Signed   By: Elon Alas   On: 04/16/2014 01:29    Review of Systems  HENT: Negative.   Eyes: Negative.   Respiratory: Positive for shortness of breath.   Cardiovascular: Negative.   Gastrointestinal: Negative.   Genitourinary: Negative.   Musculoskeletal: Positive for falls and joint pain.  Neurological: Positive for focal weakness and weakness.  Endo/Heme/Allergies: Negative.   Psychiatric/Behavioral: Negative.    Blood pressure 121/73, pulse 77, temperature 98.4 F (36.9 C), temperature source Oral, resp. rate 18, height 5' (1.524 m), weight 49.896 kg (110 lb), SpO2 90.00%. Physical Exam  Constitutional: She appears distressed.  HENT:  Head: Normocephalic.  Eyes: Pupils are equal, round, and reactive to light.  Neck: Normal range of motion.  Cardiovascular: Normal heart sounds.   Respiratory: She exhibits tenderness.  GI: Soft.  Musculoskeletal:  Pain and hematoma Right Shoulder and pain over upper back and Ribs on the right.  Neurological: She is alert.  Skin:  SubQ Hematoma on the right  Psychiatric: She has [name] normal mood and affect.    Assessment/Plan: Continue her Sling and may remove to bathe.Will follow in office in two weeks.  [name] 04/17/2014, 5:00 PM

## 2014-04-17 NOTE — Evaluation (Signed)
Occupational Therapy Evaluation Patient Details Name: SAMARA STANKOWSKI MRN: [id number] DOB: [birth year] Today's Date: 04/17/2014    History of Present Illness Pt is an 80 y.o. female s/p fall, where she sustained R clavicle fx, multi rib fxs on the right, and multi spinal fxs (Acute minimally displaced fracture of the right posterior second rib and nondisplaced posterior medial right first rib fracture. Subtle nondisplaced fracture of the left C7 transverse process which appears subacute in nature. Right distal clavicle fracture. The [facility] joint is intact. Right fourth, fifth and sixth posterior rib fractures. The fifth and sixth rib fractures are displaced. PMHx: left wrist sprain no too long ago and is edematous and she has a compress ion glove on it.   Clinical Impression   This 80 yo female admitted with above presents to acute OT with decreased use of Bil UEs, increased pain, decreased balance and decreased mobility all affecting her ability to take care of herself when she is alone (which is most of the time per pt). Pt has had a rehab stay here, then went to [facility], then home and would like to do this again to get back to an independent to Mod I level with BADLs and simplified, IADLs.    Follow Up Recommendations  CIR    Equipment Recommendations  None recommended by OT       Precautions / Restrictions Precautions Precautions: Fall Required Braces or Orthoses: Sling Restrictions Weight Bearing Restrictions: Yes RUE Weight Bearing: Non weight bearing      Mobility Bed Mobility Overal bed mobility: Needs Assistance Bed Mobility: Sit to Supine Rolling: Mod assist   Sit to supine: Max assist    Transfers Overall transfer level: Needs assistance Equipment used: None Transfers: Sit to/from [facility] Sit to Stand: Min assist Stand pivot transfers: Mod assist (with tactile cues for weight shifting to advance feet)           Balance Overall balance  assessment: Needs assistance Sitting-balance support: No upper extremity supported;Feet supported Sitting balance-Leahy Scale: Poor     Standing balance support: No upper extremity supported Standing balance-Leahy Scale: Poor                              ADL Overall ADL's : Needs assistance/impaired Eating/Feeding: Minimal assistance (post setup--mainly needs A for drinking due to issues with both UES)   Grooming: Total assistance;Sitting   Upper Body Bathing: Total assistance;Sitting   Lower Body Bathing: Total assistance (with min A sit<>stand)   Upper Body Dressing : Total assistance;Sitting   Lower Body Dressing: Total assistance (with Min A sit<>stand)   Toilet Transfer: Minimal assistance;Stand-pivot;BSC   Toileting- Clothing Manipulation and Hygiene: Total assistance (with min A sit<>stand)                         Pertinent Vitals/Pain Pain Assessment: 0-10 Pain Score: 6  Pain Location: across upper back/between shoulder blades Pain Intervention(s): Monitored during session;Repositioned;Heat applied     Hand Dominance Right   Extremity/Trunk Assessment Upper Extremity Assessment Upper Extremity Assessment: LUE deficits/detail RUE Deficits / Details: Attempts to use if for self feeding--but makes it ache, WNL elbow distally RUE Coordination: decreased gross motor LUE Deficits / Details: Wrist sprain pta--edematous and has compresson glove on it; decreased A/PROM for digit flexion/extension LUE Coordination: decreased fine motor;decreased gross motor   Lower Extremity Assessment Lower Extremity Assessment: Generalized weakness  Cervical / Trunk Assessment Cervical / Trunk Assessment: Kyphotic   Communication Communication Communication: No difficulties   Cognition Arousal/Alertness: Awake/alert Behavior During Therapy: WFL for tasks assessed/performed Overall Cognitive Status: Within Functional Limits for tasks assessed                                 Home Living Family/patient expects to be discharged to:: Private residence Living Arrangements: Alone Available Help at Discharge: Available PRN/intermittently;Neighbor;Friend(s);Personal care attendant (PCA 3 days a week--not sure how many hours on those days (they help her with B/D, and IADLs)) Type of Home: House Home Access: Stairs to enter [employer] of Steps: 5 Entrance Stairs-Rails: Left Home Layout: One level     Bathroom Shower/Tub: Occupational psychologist: Handicapped height Bathroom Accessibility: Yes   Home Equipment: Environmental consultant - 2 wheels;Shower seat - built in;Grab bars - toilet;Grab bars - tub/shower;Hand held shower head          Prior Functioning/Environment Level of Independence: Needs assistance  Gait / Transfers Assistance Needed: mod I with RW ADL's / Homemaking Assistance Needed: has HHA 3xweek   Comments: sleeps in recliner    OT Diagnosis: Generalized weakness;Acute pain   OT Problem List: Decreased strength;Decreased range of motion;Decreased activity tolerance;Impaired balance (sitting and/or standing);Pain;Decreased knowledge of precautions;Decreased knowledge of use of DME or AE;Impaired UE functional use   OT Treatment/Interventions: Self-care/ADL training;Patient/family education;Balance training;Therapeutic activities;Therapeutic exercise;DME and/or AE instruction    OT Goals(Current goals can be found in the care plan section) Acute Rehab OT Goals Patient Stated Goal: to go to rehab (hopefully here at [facility]) OT Goal Formulation: With patient Time For Goal Achievement: 04/24/14 Potential to Achieve Goals: Good  OT Frequency: Min 2X/week   Barriers to D/C: Decreased caregiver support             End of Session Equipment Utilized During Treatment:  (None) Nurse Communication:  (Nursing in room A'ing me with transfer)  Activity Tolerance: Patient limited by pain Patient left: in bed;with  call bell/phone within reach   Time: 1316-1410 OT Time Calculation (min): 54 min Charges:  OT General Charges $OT Visit: 1 Procedure OT Evaluation $Initial OT Evaluation Tier I: 1 Procedure OT Treatments $Self Care/Home Management : 38-52 mins  Almon Register [phone] 04/17/2014, 2:40 PM

## 2014-04-17 NOTE — Evaluation (Signed)
Physical Therapy Evaluation Patient Details Name: [name] MRN: [id number] DOB: 1928-12-09 Today's Date: 04/17/2014   History of Present Illness  Pt is an 80 y.o. female s/p fall, where she sustained R clavicle fx, multi rib fxs on the right, and multi spinal fxs.  Clinical Impression  Pt admitted with above. Pt currently with functional limitations due to the deficits listed below. Mobility significantly limited on eval by pain and N&V. Mod assist required for bed mobility and sit to stand.  Min assist needed for bed to chair stand-pivot transfer. Pt used RW for ambulation PTA, which will not be an option with the R clavicle fx. Pt will benefit from skilled PT to increase their independence and safety with mobility to allow discharge to the venue listed below. Recommend SNF upon d/c.      Follow Up Recommendations SNF;Supervision/Assistance - 24 hour    Equipment Recommendations  Wheelchair (measurements PT)    Recommendations for Other Services       Precautions / Restrictions Precautions Precautions: Fall Required Braces or Orthoses: Sling Restrictions RUE Weight Bearing: Non weight bearing      Mobility  Bed Mobility Overal bed mobility: Needs Assistance Bed Mobility: Rolling;Supine to Sit Rolling: Mod assist   Supine to sit: Mod assist;HOB elevated     General bed mobility comments: verbal cues for sequencing  Transfers Overall transfer level: Needs assistance Equipment used: None Transfers: Sit to/from [employer] Transfers Sit to Stand: Mod assist Stand pivot transfers: Min assist       General transfer comment: increased time to complete with above level of assist  Ambulation/Gait                Stairs            Wheelchair Mobility    Modified Rankin (Stroke Patients Only)       Balance                                             Pertinent Vitals/Pain Pain Assessment: Faces Faces Pain Scale:  Hurts whole lot Pain Intervention(s): Limited activity within patient's tolerance;Patient requesting pain meds-RN notified;Repositioned    Home Living Family/patient expects to be discharged to:: Private residence Living Arrangements: Alone Available Help at Discharge: Available PRN/intermittently;Neighbor;Friend(s);Personal care attendant Type of Home: House Home Access: Stairs to enter Entrance Stairs-Rails: Left Entrance Stairs-Number of Steps: 5 Home Layout: One level Home Equipment: Walker - 2 wheels;Shower seat - built in;Grab bars - toilet;Grab bars - tub/shower;Hand held shower head      Prior Function Level of Independence: Needs assistance   Gait / Transfers Assistance Needed: mod I with RW  ADL's / Homemaking Assistance Needed: has HHA 3xweek  Comments: sleeps in recliner     Hand Dominance   Dominant Hand: Right    Extremity/Trunk Assessment   Upper Extremity Assessment: Defer to OT evaluation           Lower Extremity Assessment: Generalized weakness      Cervical / Trunk Assessment: Kyphotic  Communication   Communication: No difficulties  Cognition Arousal/Alertness: Awake/alert Behavior During Therapy: WFL for tasks assessed/performed Overall Cognitive Status: Within Functional Limits for tasks assessed                      General Comments      Exercises  Assessment/Plan    PT Assessment Patient needs continued PT services  PT Diagnosis Difficulty walking;Generalized weakness;Acute pain   PT Problem List Decreased strength;Decreased activity tolerance;Decreased balance;Decreased mobility;Decreased knowledge of precautions;Pain  PT Treatment Interventions Gait training;Functional mobility training;Therapeutic activities;Therapeutic exercise;Balance training;Patient/family education;Stair training;DME instruction   PT Goals (Current goals can be found in the Care Plan section) Acute Rehab PT Goals Patient Stated Goal: not  stated PT Goal Formulation: With patient Time For Goal Achievement: 05/01/14 Potential to Achieve Goals: Good    Frequency Min 3X/week   Barriers to discharge Decreased caregiver support      Co-evaluation               End of Session Equipment Utilized During Treatment: Gait belt Activity Tolerance: Patient limited by pain Patient left: in chair;with call bell/phone within reach Nurse Communication: Mobility status         Time: 5035-4656 PT Time Calculation (min): 31 min   Charges:   PT Evaluation $Initial PT Evaluation Tier I: 1 Procedure PT Treatments $Therapeutic Activity: 23-37 mins   PT G Codes:          Lorriane Shire 04/17/2014, 11:52 AM

## 2014-04-18 ENCOUNTER — Inpatient Hospital Stay (HOSPITAL_COMMUNITY): Payer: Medicare Other

## 2014-04-18 DIAGNOSIS — S22009A Unspecified fracture of unspecified thoracic vertebra, initial encounter for closed fracture: Secondary | ICD-10-CM

## 2014-04-18 DIAGNOSIS — S12600A Unspecified displaced fracture of seventh cervical vertebra, initial encounter for closed fracture: Secondary | ICD-10-CM

## 2014-04-18 DIAGNOSIS — S42009A Fracture of unspecified part of unspecified clavicle, initial encounter for closed fracture: Secondary | ICD-10-CM

## 2014-04-18 DIAGNOSIS — W19XXXA Unspecified fall, initial encounter: Secondary | ICD-10-CM

## 2014-04-18 MED ORDER — HYDROCODONE-ACETAMINOPHEN 10-325 MG PO TABS
0.5000 | ORAL_TABLET | ORAL | Status: DC | PRN
  Administered 2014-04-18 – 2014-04-19 (×3): 2 via ORAL
  Administered 2014-04-21: 1 via ORAL
  Administered 2014-04-21 – 2014-04-22 (×4): 2 via ORAL
  Filled 2014-04-18 (×5): qty 2
  Filled 2014-04-18: qty 1
  Filled 2014-04-18 (×2): qty 2

## 2014-04-18 NOTE — Consult Note (Signed)
Physical Medicine and Rehabilitation Consult Reason for Consult: Multitrauma after fall Referring Physician: Trauma services   HPI: [name] is a 80 y.o. right-handed female admitted 04/16/2014 after a fall while ambulating  on the carpet with her walker and patient lives alone. She fell back on her mid back and shoulders without loss of consciousness. X-rays and imaging demonstrated a right distal clavicle fracture, C7 transverse process fracture and T1 mild compression deformity fracture as well as right-sided rib fractures. Neurosurgery Dr. Saintclair Halsted consulted advise conservative care and no bracing required. Orthopedic services Dr. Gladstone Lighter in relation the right distal clavicle fracture again with conservative care with sling applied and nonweightbearing. Hospital course pain management. Subcutaneous Lovenox for DVT prophylaxis. Physical occupational therapy evaluations completed with occupational therapy are recommending physical medicine rehabilitation consult.   Review of Systems  Respiratory: Positive for shortness of breath.   Genitourinary: Positive for frequency.  Musculoskeletal: Positive for falls and myalgias.  Neurological: Positive for headaches.  All other systems reviewed and are negative.  Past Medical History  Diagnosis Date  . GERD (gastroesophageal reflux disease)   . Osteoporosis   . Postmenopausal HRT (hormone replacement therapy)   . Anemia   . Allergy   . Asthma   . Shortness of breath   . PONV (postoperative nausea and vomiting)   . Dysrhythmia     a fib  . Chronic bronchitis     "practically q winter" (01/22/2014)  . History of blood transfusion 1993    "related to esophagus removal"  . Arthritis     "legs" (01/06/2014)  . Pneumonia     "all the time; probably 5 times in the last year, counting today" (01/06/2014)  . Aspiration pneumonia 11/22/2013  . H/O hiatal hernia   . Headache(784.0)     "monthly in the past year" (01/22/2014)  . Chronic  lower back pain     "maybe cause I fell in the tub on 01/05/2014"  . Urinary frequency   . Esophageal cancer     removed esophagus stomach pulled up; "encapsulated; didn't have to have radiation"  . Basal cell carcinoma of face     "several; freezes them off; face"   Past Surgical History  Procedure Laterality Date  . Left oophorectomy  1952  . Esophageal removal of cancer,gastric ppull-thru 1993    . Forearm fracture surgery Right ?1990's  . Esophagogastroduodenoscopy  10/19/2011    Procedure: ESOPHAGOGASTRODUODENOSCOPY (EGD);  Surgeon: Milus Banister, MD;  Location: [facility];  Service: Endoscopy;  Laterality: N/A;  . Balloon dilation  10/19/2011    Procedure: BALLOON DILATION;  Surgeon: Milus Banister, MD;  Location: [facility] ENDOSCOPY;  Service: Endoscopy;  Laterality: N/A;  . Esophagogastroduodenoscopy  11/07/2011    Procedure: ESOPHAGOGASTRODUODENOSCOPY (EGD);  Surgeon: Ladene Artist, MD,FACG;  Location: [facility] END[facility]COPY;  Service: Endoscopy;  Laterality: N/A;  . Esophagogastroduodenoscopy  11/22/2011    Procedure: ESOPHAGOGASTRODUODENOSCOPY (EGD);  Surgeon: Inda Castle, MD;  Location: [facility];  Service: Endoscopy;  Laterality: N/A;  . Esophagogastroduodenoscopy  11/24/2011    Procedure: ESOPHAGOGASTRODUODENOSCOPY (EGD);  Surgeon: Inda Castle, MD;  Location: [facility];  Service: Endoscopy;  Laterality: N/A;  with removable duodenal stent (actually using esophageal partially covered 23X15 located in locked supply room  . Duodenal stent placement  11/24/2011    Procedure: DUODENAL STENT PLACEMENT;  Surgeon: Inda Castle, MD;  Location: [facility] ENDOSCOPY;  Service: Endoscopy;  Laterality: N/A;  . Video bronchoscopy Bilateral 01/18/2013  Procedure: VIDEO BRONCHOSCOPY WITHOUT FLUORO;  Surgeon: Tanda Rockers, MD;  Location: [facility]OS[facility];  Service: Cardiopulmonary;  Laterality: Bilateral;  . Esophagogastroduodenoscopy N/A 02/11/2013    Procedure: ESOPHAGOGASTRODUODENOSCOPY (EGD);   Surgeon: Inda Castle, MD;  Location: [facility];  Service: Endoscopy;  Laterality: N/A;  . Duodenal stent placement N/A 02/11/2013    Procedure: DUODENAL STENT PLACEMENT;  Surgeon: Inda Castle, MD;  Location: [facility] ENDOSCOPY;  Service: Endoscopy;  Laterality: N/A;  . Esophagogastroduodenoscopy N/A 09/11/2013    Procedure: ESOPHAGOGASTRODUODENOSCOPY (EGD);  Surgeon: Gatha Mayer, MD;  Location: [facility];  Service: Endoscopy;  Laterality: N/A;  . Cholecystectomy  ~ 1996  . Dilation and curettage of uterus  X 6-7  . Cataract extraction w/ intraocular lens  implant, bilateral Bilateral ~ 2000  . Appendectomy     Family History  Problem Relation Age of Onset  . Cervical cancer Mother   . Heart disease Father     MI  . Coronary artery disease Brother   . Hypotension Sister   . Colon cancer Neg Hx   . Colon polyps Sister   . Pancreatic cancer      1/2 sister  . Coronary artery disease Sister     pacemaker  . Asthma Brother   . Asthma Sister    Social History:  reports that she has never smoked. She has never used smokeless tobacco. She reports that she does not drink alcohol or use illicit drugs. Allergies:  Allergies  Allergen Reactions  . Ensure [Nutritional Supplements]     "gives me diarrhea"  . Erythromycin Ethylsuccinate     Irregular pulse rate  . Prednisone     "makes me hyper"  . Doxycycline Rash   Medications Prior to Admission  Medication Sig Dispense Refill  . acetaminophen (TYLENOL) 500 MG tablet Take 1 tablet (500 mg total) by mouth every 6 (six) hours as needed for headache.  30 tablet    . albuterol (PROVENTIL HFA;VENTOLIN HFA) 108 (90 BASE) MCG/ACT inhaler Inhale 2 puffs into the lungs every 6 (six) hours as needed. For wheezing  1 Inhaler  4  . amiodarone (PACERONE) 200 MG tablet Take 1 tablet (200 mg total) by mouth daily.  90 tablet  1  . chlorpheniramine-HYDROcodone (TUSSIONEX PENNKINETIC ER) 10-8 MG/5ML LQCR 1 tsp every 12 hours as needed  480 mL   0  . ergocalciferol (VITAMIN D2) 50000 UNITS capsule Take 1 capsule (50,000 Units total) by mouth every Friday.  12 capsule  1  . Fe Fum-FA-B Cmp-C-Zn-Mg-Mn-Cu (HEMOCYTE-PLUS) 106-1 MG TABS Take 1 capsule by mouth 2 (two) times daily.  180 tablet  1  . furosemide (LASIX) 40 MG tablet Take 1 tablet (40 mg total) by mouth daily.  90 tablet  1  . lansoprazole (PREVACID) 15 MG capsule Take 2 capsules (30 mg total) by mouth 2 (two) times daily before a meal.  180 capsule  1  . methylPREDNIsolone (MEDROL DOSPACK) 4 MG tablet Take by mouth See admin instructions. Take 6 tabs on the first day, tapering by 1 tablet each day for 6 days.      . metoCLOPramide (REGLAN) 5 MG tablet Take 5 mg by mouth 2 (two) times daily. 1 hour before breakfast and supper      . Multiple Vitamins-Calcium (VIACTIV MULTI-VITAMIN) CHEW Chew 1 each by mouth daily.    0  . Olopatadine HCl (PATADAY) 0.2 % SOLN Apply 1-2 drops to eye 2 (two) times daily as needed (dry, itchy eyes.).      Marland Kitchen  ondansetron (ZOFRAN) 4 MG tablet Take 1 tablet (4 mg total) by mouth at bedtime.  90 tablet  1  . sucralfate (CARAFATE) 1 GM/10ML suspension Take 10 mLs (1 g total) by mouth 2 (two) times daily before a meal.  420 mL  0  . traMADol (ULTRAM) 50 MG tablet Take 1 tablet (50 mg total) by mouth every 8 (eight) hours as needed for moderate pain.  90 tablet  0  . zolpidem (AMBIEN) 5 MG tablet Take 5 mg by mouth at bedtime.        Home: Home Living Family/patient expects to be discharged to:: Private residence Living Arrangements: Alone Available Help at Discharge: Available PRN/intermittently;Neighbor;Friend(s);Personal care attendant (PCA 3 days a week--not sure how many hours on those days (they help her with B/D, and IADLs)) Type of Home: House Home Access: Stairs to enter [employer] of Steps: 5 Entrance Stairs-Rails: Left Home Layout: One level Home Equipment: [place] - 2 wheels;Shower seat - built in;Grab bars - toilet;Grab bars -  tub/shower;Hand held shower head  Functional History: Prior Function Level of Independence: Needs assistance Gait / Transfers Assistance Needed: mod I with RW ADL's / Homemaking Assistance Needed: has HHA 3xweek Comments: sleeps in recliner Functional Status:  Mobility: Bed Mobility Overal bed mobility: Needs Assistance Bed Mobility: Sit to Supine Rolling: Mod assist Supine to sit: Mod assist;HOB elevated Sit to supine: Max assist General bed mobility comments: verbal cues for sequencing Transfers Overall transfer level: Needs assistance Equipment used: None Transfers: Sit to/from [employer] Sit to Stand: Min assist Stand pivot transfers: Mod assist (with tactile cues for weight shifting to advance feet) General transfer comment: increased time to complete with above level of assist      ADL: ADL Overall ADL's : Needs assistance/impaired Eating/Feeding: Minimal assistance (post setup--mainly needs A for drinking due to issues with both UES) Grooming: Total assistance;Sitting Upper Body Bathing: Total assistance;Sitting Lower Body Bathing: Total assistance (with min A sit<>stand) Upper Body Dressing : Total assistance;Sitting Lower Body Dressing: Total assistance (with Min A sit<>stand) Toilet Transfer: Minimal assistance;Stand-pivot;BSC Toileting- Clothing Manipulation and Hygiene: Total assistance (with min A sit<>stand)  Cognition: Cognition Overall Cognitive Status: Within Functional Limits for tasks assessed Cognition Arousal/Alertness: Awake/alert Behavior During Therapy: WFL for tasks assessed/performed Overall Cognitive Status: Within Functional Limits for tasks assessed  Blood pressure 128/73, pulse 62, temperature 98.5 F (36.9 C), temperature source Oral, resp. rate 18, height 5' (1.524 m), weight 49.896 kg (110 lb), SpO2 96.00%. Physical Exam  Constitutional: She is oriented to person, place, and time.  Frail appearing   HENT:  Head:  Normocephalic.  Eyes: Conjunctivae and EOM are normal. Pupils are equal, round, and reactive to light.  Neck: Normal range of motion. Neck supple. Tracheal deviation present. No thyromegaly present.  Cardiovascular: Normal rate and regular rhythm.   Respiratory: Effort normal and breath sounds normal. No respiratory distress. She has no wheezes.  GI: Soft. Bowel sounds are normal. She exhibits no distension.  Musculoskeletal:  Right upper extremity was shoulder sling  Neurological: She is alert and oriented to person, place, and time.  Moves all 4's but limited due to ortho/pain  Skin: Skin is warm and dry.  Psychiatric:  Cooperative, a little delayed    No results found for this or any previous visit (from the past 24 hour(s)). Ct Chest Wo Contrast  04/16/2014   CLINICAL DATA:  Possible T3 fracture.  EXAM: CT CHEST WITHOUT CONTRAST  TECHNIQUE: Multidetector CT imaging of the  chest was performed following the standard protocol without IV contrast.  COMPARISON:  Thoracic spine radiographs 04/16/2014  FINDINGS: Normal alignment of the thoracic vertebral bodies. No acute fracture is identified. No bone lesions. The facets are normally aligned.  There are right-sided fourth, fifth and sixth posterior rib fractures. The fifth and sixth ribs fractures are displaced. There is associated pleural fluid/ hematoma but no pneumothorax. No definite left-sided rib fractures.  There is a distal right clavicle fracture. The glenohumeral joint is maintained.  There are surgical changes from a gastric pull-through procedure with a distal metallic stent. No acute pulmonary findings. The heart is enlarged but stable. There is tortuosity and calcification of the thoracic aorta. The upper abdomen is unremarkable.  IMPRESSION: 1. Right distal clavicle fracture.  The [facility] joint is intact. 2. Right fourth, fifth and sixth posterior rib fractures. The fifth and sixth rib fractures are displaced. No pneumothorax. 3. Normal  alignment of the thoracic vertebral bodies and no acute thoracic spine compression fracture. 4. Surgical changes related to a gastric pull-through procedure with a distal stent.   Electronically Signed   By: Kalman Jewels M.D.   On: 04/16/2014 15:07   Ct Cervical Spine Wo Contrast  04/16/2014   CLINICAL DATA:  Followup fall. Evaluate right distal clavicle fracture.  EXAM: CT CERVICAL SPINE WITHOUT CONTRAST  TECHNIQUE: Multidetector CT imaging of the cervical spine was performed without intravenous contrast. Multiplanar CT image reconstructions were also generated.  COMPARISON:  01/06/2014 and plain films 04/16/2014  FINDINGS: Examination demonstrates moderate reversal of normal cervical lordosis. There is subtle stair step anterior subluxation of 1-2 mm of C3 on C4 and C4 on C5 without significant change. There is mild spondylosis throughout the cervical spine. There is moderate disc space narrowing at the C5-6 and C6-7 levels unchanged. There is mild depression of the superior endplate of T1 which is a new finding. There is a subtle oblique lucency through the anterior aspect of the T1 vertebral body seen only on the sagittal reformatted images as this may be artifactual, although cannot exclude a fracture. Prevertebral soft tissues as well as the atlantoaxial articulation are within normal. There is uncovertebral joint spurring and facet arthropathy.  Exam demonstrates a minimally displaced posterior right second rib fracture and nondisplaced posterior medial right first rib fracture. There is a fracture of the left C7 transverse process which appears subacute in nature.  No evidence pneumothorax. Evidence of patient's gastric pull-through procedure.  IMPRESSION: Acute minimally displaced fracture of the right posterior second rib and nondisplaced posterior medial right first rib fracture. Subtle nondisplaced fracture of the left C7 transverse process which appears subacute in nature.  Mild spondylosis of the  cervical spine with disc disease at the C5-6 and C6-7 levels. Moderate reversal of the normal cervical lordosis. Subtle anterior stairstep subluxation of 1-2 mm of C3 on C4 and C4 on C5 unchanged likely degenerative in nature.  New mild depression of the superior endplate of T1 suggesting acute to subacute fracture. Suggestion of an oblique lucency through the anterior aspect of the T1 vertebral body seen only on the sagittal reformatted images which may be artifactual. Consider MRI for further evaluation.  These results were called by telephone at the time of interpretation on 04/16/2014 at 3:36 pm to Dr. Silvestre Gunner , who verbally acknowledged these results.   Electronically Signed   By: Marin Olp M.D.   On: 04/16/2014 15:37   Dg Chest Port 1 View  04/18/2014   CLINICAL DATA:  Chest pain  EXAM: PORTABLE CHEST - 1 VIEW  COMPARISON:  04/17/2014  FINDINGS: Cardiac shadow is stable but enlarged. Esophageal stent is again identified and has moved slightly likely related to the patient's known hiatal hernia. Multiple right-sided rib fractures are again that identified. No pneumothorax is seen. Elevation of left hemidiaphragm is noted.  IMPRESSION: Multiple right rib fractures without pneumothorax.  No new significant abnormality is seen.   Electronically Signed   By: Inez Catalina M.D.   On: 04/18/2014 08:15   Dg Chest Port 1 View  04/17/2014   CLINICAL DATA:  Evaluate rib fracture  EXAM: PORTABLE CHEST - 1 VIEW  COMPARISON:  CT scan of the chest 04/16/2014  FINDINGS: Acute displaced fractures of multiple right-sided ribs including ribs 1, 2, 3, 4, 5 and 6. Minimally displaced fracture of the distal right clavicle. No pneumothorax. Trace pleural effusion. Stable cardiomegaly. Large hiatal hernia with overlapping esophageal stents. Atherosclerotic calcification in the aorta. Left basilar atelectasis versus infiltrate. Stable chronic bronchitic changes and minimal interstitial prominence.  IMPRESSION: 1. Acute  displaced fractures of the right first through the sixth ribs. The first 3 rib fractures are more evident on today's examination compared to the CT scan of the chest from 04/16/2014. No pneumothorax or significant hemothorax. 2. Mildly displaced fracture of the distal aspect of the right clavicle. 3. Large hiatal hernia with overlapping esophageal stents in place. 4. Left basilar atelectasis versus infiltrate appear similar compared to prior. No acute cardiopulmonary process.   Electronically Signed   By: Jacqulynn Cadet M.D.   On: 04/17/2014 08:04    Assessment/Plan: Diagnosis: cervical, thoracic, right clavicle fx's with other associated trauma 1. Does the need for close, 24 hr/day medical supervision in concert with the patient's rehab needs make it unreasonable for this patient to be served in a less intensive setting? Potentially 2. Co-Morbidities requiring supervision/potential complications: pain, wound care, abla 3. Due to bladder management, bowel management, safety, skin/wound care, disease management, medication administration, pain management and patient education, does the patient require 24 hr/day rehab nursing? Potentially 4. Does the patient require coordinated care of a physician, rehab nurse, PT (1-2 hrs/day, 5 days/week) and OT (1-2 hrs/day, 5 days/week) to address physical and functional deficits in the context of the above medical diagnosis(es)? Potentially Addressing deficits in the following areas: balance, endurance, locomotion, strength, transferring, bowel/bladder control, bathing, dressing, feeding, grooming, toileting and cognition 5. Can the patient actively participate in an intensive therapy program of at least 3 hrs of therapy per day at least 5 days per week? No and Potentially 6. The potential for patient to make measurable gains while on inpatient rehab is fair 7. Anticipated functional outcomes upon discharge from inpatient rehab are min assist and mod assist  with  PT, min assist and mod assist with OT, n/a with SLP. 8. Estimated rehab length of stay to reach the above functional goals is: ?16-20 days 9. Does the patient have adequate social supports to accommodate these discharge functional goals? Potentially 10. Anticipated D/C setting: Home 11. Anticipated post D/C treatments: [place] therapy 12. Overall Rehab/Functional Prognosis: good and fair  RECOMMENDATIONS: This patient's condition is appropriate for continued rehabilitative care in the following setting: CIR vs SNF Patient has agreed to participate in recommended program. Potentially Note that insurance prior authorization may be required for reimbursement for recommended care.  Comment: I am concerned about her activity tolerance and dispo. Family would need to commit to substantial aftercare if we bring her to CIR.  Rehab Admissions Coordinator to follow up.  Thanks,  Erica Staggers, MD, Mellody Drown     04/18/2014

## 2014-04-18 NOTE — Progress Notes (Signed)
Physical Therapy Treatment Patient Details Name: [name] MRN: [id number] DOB: [birth year] Today's Date: 04/18/2014    History of Present Illness Pt is an 80 y.o. female s/p fall, where she sustained R clavicle fx, multi rib fxs on the right, and multi spinal fxs (Acute minimally displaced fracture of the right posterior second rib and nondisplaced posterior medial right first rib fracture. Subtle nondisplaced fracture of the left C7 transverse process which appears subacute in nature. Right distal clavicle fracture. The [facility] joint is intact. Right fourth, fifth and sixth posterior rib fractures. The fifth and sixth rib fractures are displaced. PMHx: left wrist sprain no too long ago and is edematous and she has a compress ion glove on it.    PT Comments    Patient progressing slowly. Limited by fatigue and overall weakness. Continue to recommend SNF for ongoing Physical Therapy.     Follow Up Recommendations  SNF;Supervision/Assistance - 24 hour     Equipment Recommendations  Wheelchair (measurements PT)    Recommendations for Other Services       Precautions / Restrictions Precautions Precautions: Fall Required Braces or Orthoses: Sling Restrictions RUE Weight Bearing: Non weight bearing    Mobility  Bed Mobility Overal bed mobility: Needs Assistance Bed Mobility: Sit to Supine     Supine to sit: Mod assist;HOB elevated     General bed mobility comments: A with use of chuck pad to scoot EOB. Cues for positioning and not to use R UE. A on L side to sit upright  Transfers Overall transfer level: Needs assistance Equipment used: None     Stand pivot transfers: Mod assist       General transfer comment: Patient able to take some pivotal steps with SPT. Cues for positioning and advancement of LEs. A for balance and to shift weight to L side. A to control sit onto recliner  Ambulation/Gait                 Stairs            Wheelchair Mobility     Modified Rankin (Stroke Patients Only)       Balance                                    Cognition Arousal/Alertness: Awake/alert Behavior During Therapy: WFL for tasks assessed/performed Overall Cognitive Status: Within Functional Limits for tasks assessed                      Exercises      General Comments        Pertinent Vitals/Pain Faces Pain Scale: Hurts little more Pain Location: Did not state exact location Pain Intervention(s): Monitored during session;Limited activity within patient's tolerance    Home Living                      Prior Function            PT Goals (current goals can now be found in the care plan section) Progress towards PT goals: Progressing toward goals    Frequency  Min 3X/week    PT Plan Current plan remains appropriate    Co-evaluation             End of Session Equipment Utilized During Treatment: Gait belt Activity Tolerance: Patient tolerated treatment well;Patient limited by fatigue Patient left: in chair;with call bell/phone within  reach     Time: 1131-1146 PT Time Calculation (min): 15 min  Charges:  $Therapeutic Activity: 8-22 mins                    G Codes:      Jacqualyn Posey 04/18/2014, 12:02 PM 04/18/2014 Jacqualyn Posey PTA [phone] pager [phone] office

## 2014-04-18 NOTE — Progress Notes (Signed)
Occupational Therapy Treatment Patient Details Name: [name] MRN: [id number] DOB: 1928-09-18 Today's Date: 04/18/2014    History of present illness Pt is an 80 y.o. female s/p fall, where she sustained R clavicle fx, multi rib fxs on the right, and multi spinal fxs (Acute minimally displaced fracture of the right posterior second rib and nondisplaced posterior medial right first rib fracture. Subtle nondisplaced fracture of the left C7 transverse process which appears subacute in nature. Right distal clavicle fracture. The [facility] joint is intact. Right fourth, fifth and sixth posterior rib fractures. The fifth and sixth rib fractures are displaced. PMHx: left wrist sprain no too long ago and is edematous and she has a compress ion glove on it.   OT comments  This patient seen today with main focus on self-feeding and ROM of LUE. Not making progress today with self-feeding (positioning, kyphotic posture, tendency to lean to the left, old sprain LUE, and RUE clavicle fx) all contributing to this.  Follow Up Recommendations  CIR    Equipment Recommendations  None recommended by OT       Precautions / Restrictions Precautions Precautions: Fall Required Braces or Orthoses: Sling Restrictions Weight Bearing Restrictions: Yes RUE Weight Bearing: Non weight bearing       Mobility Bed Mobility Overal bed mobility: Needs Assistance Bed Mobility: Sit to Supine     Supine to sit: Mod assist;HOB elevated     General bed mobility comments: A with use of chuck pad to scoot EOB. Cues for positioning and not to use R UE. A on L side to sit upright  Transfers Overall transfer level: Needs assistance Equipment used: None     Stand pivot transfers: Mod assist       General transfer comment: Patient able to take some pivotal steps with SPT. Cues for positioning and advancement of LEs. A for balance and to shift weight to L side. A to control sit onto recliner        ADL Overall  ADL's : Needs assistance/impaired Eating/Feeding: Minimal assistance (with yogurt and drink. Made NT aware that pt's place needs to be in the patient's lap in order for to try and feed herself successfully)                                                      Cognition   Behavior During Therapy: [facility] for tasks assessed/performed Overall Cognitive Status: Within Functional Limits for tasks assessed                         Exercises Other Exercises Other Exercises: Left hand: Had pt perform 10 reps of AAROM composite flexion/extension of digits, AAROM of wrist flexion/extension, AAROM forearm supination/pronation. Left her with digits extended as much as possible (lacks about 1/4 of range) Family is to bring in a postioning pillow she had at home           Pertinent Vitals/ Pain       Faces Pain Scale: Hurts little more Pain Location: Did not state exact location Pain Intervention(s): Monitored during session;Limited activity within patient's tolerance         Frequency Min 2X/week     Progress Toward Goals  OT Goals(current goals can now be found in the care plan section)  Progress towards OT  goals: Not progressing toward goals - comment (remains at same level as yesterday for self feeding)     Plan Discharge plan remains appropriate          Activity Tolerance Patient tolerated treatment well   Patient Left in bed           Time: 1610-9604 OT Time Calculation (min): 30 min  Charges: OT General Charges $OT Visit: 1 Procedure OT Treatments $Self Care/Home Management : 23-37 mins  Almon Register [phone] 04/18/2014, 12:34 PM

## 2014-04-18 NOTE — Progress Notes (Signed)
Looks a little better. Doing flutter valve and has decent cough. Mobilize. Patient examined and I agree with the assessment and plan  Georganna Skeans, MD, MPH, FACS Trauma: [phone] General Surgery: [phone]  04/18/2014 10:53 AM

## 2014-04-18 NOTE — Progress Notes (Signed)
Patient ID: [name], female   DOB: 1929/06/29, 80 y.o.   MRN: [id number]   LOS: 3 days   Subjective: Feeling better this am.   Objective: Vital signs in last 24 hours: Temp:  [98.1 F (36.7 C)-98.5 F (36.9 C)] 98.5 F (36.9 C) (09/18 0522) Pulse Rate:  [62-78] 62 (09/18 0522) Resp:  [18] 18 (09/18 0522) BP: (121-130)/(73-79) 128/73 mmHg (09/18 0522) SpO2:  [90 %-96 %] 96 % (09/18 0522) Last BM Date: 04/15/14   Radiology Results PORTABLE CHEST - 1 VIEW  COMPARISON: 04/17/2014  FINDINGS:  Cardiac shadow is stable but enlarged. Esophageal stent is again  identified and has moved slightly likely related to the patient's  known hiatal hernia. Multiple right-sided rib fractures are again  that identified. No pneumothorax is seen. Elevation of left  hemidiaphragm is noted.  IMPRESSION:  Multiple right rib fractures without pneumothorax.  No new significant abnormality is seen.  Electronically Signed  By: Inez Catalina M.D.  On: 04/18/2014 08:15   Physical Exam General appearance: alert and no distress Resp: clear to auscultation bilaterally Cardio: regular rate and rhythm GI: normal findings: bowel sounds normal and soft, non-tender   Assessment/Plan: Fall  C7 TVP fx/T1 fx -- No treatment necessary per NS  Right clav fx -- Appreciate ortho consult, in sling  Multiple right rib fxs -- Pulmonary toilet  Multiple medical problems -- Home meds  FEN -- Still using frequent breakthrough morphine, will increase Norco range VTE -- SCD's, Lovenox  Dispo -- CIR consult    Lisette Abu, PA-C Pager: [phone] General Trauma PA Pager: [phone]  04/18/2014

## 2014-04-18 NOTE — Progress Notes (Signed)
04/18/14 PT/OT recommended CIR. Referral made to CSW for SNF as alternative if CIR unable to work with patient. CM will continue to follow. Fuller Plan RN, BSN, CCM

## 2014-04-19 ENCOUNTER — Inpatient Hospital Stay (HOSPITAL_COMMUNITY): Payer: Medicare Other

## 2014-04-19 MED ORDER — DOCUSATE SODIUM 100 MG PO CAPS
200.0000 mg | ORAL_CAPSULE | Freq: Two times a day (BID) | ORAL | Status: DC
  Administered 2014-04-19 – 2014-04-22 (×5): 200 mg via ORAL
  Filled 2014-04-19 (×6): qty 2

## 2014-04-19 MED ORDER — POLYETHYLENE GLYCOL 3350 17 G PO PACK
17.0000 g | PACK | Freq: Every day | ORAL | Status: DC
  Administered 2014-04-19 – 2014-04-22 (×3): 17 g via ORAL
  Filled 2014-04-19 (×6): qty 1

## 2014-04-19 NOTE — Progress Notes (Signed)
Pulmonary toilet.  Pain control.  Stable otherwise.

## 2014-04-19 NOTE — Progress Notes (Signed)
Patient ID: [name], female   DOB: [birth year], 80 y.o.   MRN: [id number]   LOS: 4 days   Subjective: Feeling better, pain under better control.   Objective: Vital signs in last 24 hours: Temp:  [98.1 F (36.7 C)-98.4 F (36.9 C)] 98.1 F (36.7 C) (09/19 0605) Pulse Rate:  [65-76] 76 (09/19 0605) Resp:  [18] 18 (09/19 0605) BP: (103-128)/(67-74) 103/67 mmHg (09/19 0605) SpO2:  [95 %-96 %] 95 % (09/19 0605) Last BM Date: 04/15/14   Physical Exam General appearance: alert and no distress Resp: diminished breath sounds LUL Cardio: regular rate and rhythm GI: normal findings: bowel sounds normal and soft, non-tender   Assessment/Plan: Fall  C7 TVP fx/T1 fx -- No treatment necessary per NS  Right clav fx -- Appreciate ortho consult, in sling  Multiple right rib fxs -- Pulmonary toilet, check CXR Multiple medical problems -- Home meds  FEN -- No issues VTE -- SCD's, Lovenox  Dispo -- CIR vs SNF early next week    Lisette Abu, PA-C Pager: [phone] General Trauma PA Pager: [phone]  04/19/2014

## 2014-04-20 MED ORDER — MAGNESIUM CITRATE PO SOLN
1.0000 | Freq: Once | ORAL | Status: AC
  Administered 2014-04-20: 1 via ORAL
  Filled 2014-04-20: qty 296

## 2014-04-20 NOTE — Progress Notes (Signed)
This patient has been seen and I agree with the findings and treatment plan.  Margues Filippini O. Hellena Pridgen, III, MD, FACS [phone] (pager) [phone] (direct pager) Trauma Surgeon  

## 2014-04-20 NOTE — Progress Notes (Signed)
Patient ID: [name], female   DOB: 1929-02-03, 80 y.o.   MRN: [id number]   LOS: 5 days   Subjective: Feeling better, would like bowels to move.   Objective: Vital signs in last 24 hours: Temp:  [98.3 F (36.8 C)-99.1 F (37.3 C)] 99.1 F (37.3 C) (09/20 0609) Pulse Rate:  [78-82] 82 (09/20 0609) Resp:  [16] 16 (09/20 0609) BP: (124-125)/(70-74) 124/70 mmHg (09/20 0609) SpO2:  [91 %-92 %] 91 % (09/20 0609) Last BM Date: 04/15/14   Physical Exam General appearance: alert and no distress Resp: diminished breath sounds LUL Cardio: regular rate and rhythm GI: normal findings: bowel sounds normal and soft, non-tender   Assessment/Plan: Fall  C7 TVP fx/T1 fx -- No treatment necessary per NS  Right clav fx -- Appreciate ortho consult, in sling  Multiple right rib fxs -- Pulmonary toilet Multiple medical problems -- Home meds  FEN -- Try mag citrate  VTE -- SCD's, Lovenox  Dispo -- CIR vs SNF early next week    Lisette Abu, PA-C Pager: [phone] General Trauma PA Pager: [phone]  04/20/2014

## 2014-04-21 NOTE — Progress Notes (Signed)
Patient examined and I agree with the assessment and plan Just got up to chair with therapies. CIR vs SNF. Georganna Skeans, MD, MPH, FACS Trauma: [phone] General Surgery: [phone]  04/21/2014 10:50 AM

## 2014-04-21 NOTE — Progress Notes (Signed)
Physical Therapy Treatment Patient Details Name: [name] MRN: [id number] DOB: [birth year] Today's Date: 04/21/2014    History of Present Illness Pt is an 80 y.o. female s/p fall, where she sustained R clavicle fx, multi rib fxs on the right, and multi spinal fxs (Acute minimally displaced fracture of the right posterior second rib and nondisplaced posterior medial right first rib fracture. Subtle nondisplaced fracture of the left C7 transverse process which appears subacute in nature. Right distal clavicle fracture. The [facility] joint is intact. Right fourth, fifth and sixth posterior rib fractures. The fifth and sixth rib fractures are displaced. PMHx: left wrist sprain no too long ago and is edematous and she has a compress ion glove on it.    PT Comments    Patient able to walk small distance in room this morning.  Continues to be limited by overall weakness and fatigue. Unsure that patient would be able to tolerate CIR. Continue to recommend SNF for ongoing Physical Therapy.     Follow Up Recommendations  SNF;Supervision/Assistance - 24 hour     Equipment Recommendations  Wheelchair (measurements PT)    Recommendations for Other Services       Precautions / Restrictions Precautions Precautions: Fall Required Braces or Orthoses: Sling (for comfort) Restrictions RUE Weight Bearing: Non weight bearing    Mobility  Bed Mobility   Bed Mobility: Sit to Supine Rolling: Mod assist   Supine to sit: Mod assist;HOB elevated     General bed mobility comments: A with use of chuck pad to scoot EOB. Cues for positioning and not to use R UE. A on L side to sit upright  Transfers Overall transfer level: Needs assistance Equipment used: None Transfers: Sit to/from Stand Sit to Stand: Min assist            Ambulation/Gait Ambulation/Gait assistance: Mod assist;+2 safety/equipment Ambulation Distance (Feet): 30 Feet Assistive device: 1 person hand held assist Gait  Pattern/deviations: Step-to pattern;Shuffle;Narrow base of support;Trunk flexed   Gait velocity interpretation: <1.8 ft/sec, indicative of risk for recurrent falls General Gait Details: A for balance as patient with posterior lean and scissoring gait at times with narrow BOS. Limited by fatigue   Stairs            Wheelchair Mobility    Modified Rankin (Stroke Patients Only)       Balance                                    Cognition Arousal/Alertness: Awake/alert Behavior During Therapy: WFL for tasks assessed/performed Overall Cognitive Status: Within Functional Limits for tasks assessed                      Exercises      General Comments        Pertinent Vitals/Pain Pain Assessment: No/denies pain Pain Score: 0-No pain Faces Pain Scale: No hurt    Home Living                      Prior Function            PT Goals (current goals can now be found in the care plan section) Progress towards PT goals: Progressing toward goals    Frequency  Min 3X/week    PT Plan Current plan remains appropriate    Co-evaluation  End of Session Equipment Utilized During Treatment: Gait belt Activity Tolerance: Patient tolerated treatment well;Patient limited by fatigue Patient left: in chair;with call bell/phone within reach     Time: 1027-1041 PT Time Calculation (min): 14 min  Charges:  $Gait Training: 8-22 mins                    G Codes:      Jacqualyn Posey 04/21/2014, 11:25 AM 04/21/2014 Jacqualyn Posey PTA [phone] pager [phone] office

## 2014-04-21 NOTE — Progress Notes (Signed)
Clinical Social Work Department CLINICAL SOCIAL WORK PLACEMENT NOTE 04/21/2014  Patient:  DANNIELLA, ROBBEN  Account Number:  [id number] Admit date:  04/15/2014  Clinical Social Worker:  Creta Levin, LCSW  Date/time:  04/21/2014 10:54 AM  Clinical Social Work is seeking post-discharge placement for this patient at the following level of care:   SKILLED NURSING   (*CSW will update this form in Epic as items are completed)   04/21/2014  Patient/family provided with [facility] Department of Clinical Social Work's list of facilities offering this level of care within the geographic area requested by the patient (or if unable, by the patient's family).  04/21/2014  Patient/family informed of their freedom to choose among providers that offer the needed level of care, that participate in Medicare, Medicaid or managed care program needed by the patient, have an available bed and are willing to accept the patient.  04/21/2014  Patient/family informed of [facility]' ownership interest in [facility], as well as of the fact that they are under no obligation to receive care at this facility.  PASARR submitted to EDS on  PASARR number received on   FL2 transmitted to all facilities in geographic area requested by pt/family on  04/21/2014 FL2 transmitted to all facilities within larger geographic area on   Patient informed that his/her managed care company has contracts with or will negotiate with  certain facilities, including the following:     Patient/family informed of bed offers received:   Patient chooses bed at  Physician recommends and patient chooses bed at    Patient to be transferred to  on   Patient to be transferred to facility by  Patient and family notified of transfer on  Name of family member notified:    The following physician request were entered in Epic:   Additional Comments:

## 2014-04-21 NOTE — Progress Notes (Signed)
Palliative Medicine Team Progress Note  On 02/28/2014 patient was seen by Rosanne Sack, NP- MOST form was completed with outpatient Palliative Care services- patients niece, Chauncey Reading has the form. Niece expresses concern re: pain medication/m,edication side effects and confusion-inability to rehab.  Patient has 5 admissions in last 6 months- she is very appropriate for palliative consultation-we need to clarify goals of care and assist with her pain/symptom mangement and optiions for transition to next level of care.  Will meet family tomorrow 9/22 at 12 noon.  Lane Hacker, DO Palliative Medicine

## 2014-04-21 NOTE — Clinical Social Work Psychosocial (Signed)
Clinical Social Work Department BRIEF PSYCHOSOCIAL ASSESSMENT 04/21/2014  Patient:  [name], [name]     Account Number:  [id number]     Admit date:  04/15/2014  Clinical Social Worker:  Wylene Men  Date/Time:  04/21/2014 01:53 PM  Referred by:  Physician  Date Referred:  04/21/2014 Referred for  SNF Placement   Other Referral:   none   Interview type:  Other - See comment Other interview type:   neice, POA, Judy    PSYCHOSOCIAL DATA Living Status:  ALONE Admitted from facility:   Level of care:   Primary support name:  [name] Primary support relationship to patient:  FAMILY Degree of support available:   strong    CURRENT CONCERNS Current Concerns  Post-Acute Placement   Other Concerns:   none    SOCIAL WORK ASSESSMENT / PLAN CSW assessed pt.  Pt is not oriented and remains very sleepy (reportedly due to side effects of pain meds). Neice, [name] is also HCPOA and main caregiver for pt.  [name] states that prior to hospital admission, pt was alert and oriented capable of independent living with minimal supervision assistance in the evenings.    Neice is hopeful for CIR admissioin.  [name] is agreeable to SNF search as a backup, but reamins adament in regards to CIR admission. Genie in CIR was contacted for f/u. CSW awaiting a return call.    CSW will continue to assist for disposition needs.   Assessment/plan status:  Psychosocial Support/Ongoing Assessment of Needs Other assessment/ plan:   FL2  PASARR   Information/referral to community resources:   SNF  CIR    PATIENT'S/FAMILY'S RESPONSE TO PLAN OF CARE: [name] is POA and is adamant regarding CIR admission though was agreeable for SNF backup to CIR.       Nonnie Done, [place] [phone]  Psychiatric & Orthopedics (5N 1-16) Clinical Social Worker

## 2014-04-21 NOTE — Progress Notes (Signed)
Patient ID: [name], female   DOB: 05-15-1929, 80 y.o.   MRN: [id number]   LOS: 6 days   Subjective: No new c/o. Laxative successful.   Objective: Vital signs in last 24 hours: Temp:  [98.3 F (36.8 C)-98.7 F (37.1 C)] 98.7 F (37.1 C) (09/21 0610) Pulse Rate:  [80-90] 80 (09/21 0610) Resp:  [16] 16 (09/21 0610) BP: (140-152)/(77-96) 140/77 mmHg (09/21 0610) SpO2:  [93 %] 93 % (09/21 0610) Last BM Date: 04/20/14   Physical Exam General appearance: alert and no distress Resp: clear to auscultation bilaterally Cardio: regular rate and rhythm GI: normal findings: bowel sounds normal and soft, non-tender   Assessment/Plan: Fall  C7 TVP fx/T1 fx -- No treatment necessary per NS  Right clav fx -- Appreciate ortho consult, in sling  Multiple right rib fxs -- Pulmonary toilet  Multiple medical problems -- Home meds  FEN -- No new issues VTE -- SCD's, Lovenox  Dispo -- CIR vs SNF early this week. Spoke with niece [name] about options and she would be appreciative of a palliative care consult.    Lisette Abu, PA-C Pager: [phone] General Trauma PA Pager: [phone]  04/21/2014

## 2014-04-21 NOTE — Progress Notes (Signed)
Pt having frequent stools from laxatives given this a.m. She has been up to bsc every one to two hours. Small skin tear noted on sacrum from frequent stools and cleaning. Pink foam pad applied to sacrum. Pt also is having more pain in her right neck and shoulder because of the increased activity. She is also experiencing more confusion as the night progresses. Vicodin given for pain and to help pt rest.

## 2014-04-21 NOTE — Progress Notes (Signed)
I contacted pt's niece, [name], by phone. We discussed that in 12/2013 pt did inpt rehab and then went home Mod I until she fell end of June and was readmitted and then d/c to [facility] SNF. Pt currently not at a level to tolerate intense inpt rehab nor can [place] care for pt 24/7 in pt's home. [name] is concerned that pt is too sedated therefore [name] worries that direct d/c to SNF is not a good idea. Her wishes are that pt d/c to inpt rehab for 5 to 7 days while she is on increased pain meds and then d/c to SNF. I explained that this plan is not realistic at this point. I will follow up with Dr. Naaman Plummer, but told [name] that SNF is the recommendation at this time. I will follow up with [name] tomorrow. [phone]

## 2014-04-22 DIAGNOSIS — K922 Gastrointestinal hemorrhage, unspecified: Secondary | ICD-10-CM

## 2014-04-22 DIAGNOSIS — S2239XA Fracture of one rib, unspecified side, initial encounter for closed fracture: Secondary | ICD-10-CM

## 2014-04-22 LAB — CBC
HCT: 37.4 % (ref 36.0–46.0)
HEMOGLOBIN: 11.8 g/dL — AB (ref 12.0–15.0)
MCH: 25 pg — ABNORMAL LOW (ref 26.0–34.0)
MCHC: 31.6 g/dL (ref 30.0–36.0)
MCV: 79.2 fL (ref 78.0–100.0)
Platelets: 296 10*3/uL (ref 150–400)
RBC: 4.72 MIL/uL (ref 3.87–5.11)
RDW: 16 % — ABNORMAL HIGH (ref 11.5–15.5)
WBC: 11.2 10*3/uL — ABNORMAL HIGH (ref 4.0–10.5)

## 2014-04-22 MED ORDER — ALPRAZOLAM 0.25 MG PO TABS
0.2500 mg | ORAL_TABLET | Freq: Every day | ORAL | Status: DC
  Administered 2014-04-22 – 2014-04-27 (×6): 0.25 mg via ORAL
  Filled 2014-04-22 (×6): qty 1

## 2014-04-22 MED ORDER — DIBUCAINE 1 % RE OINT
TOPICAL_OINTMENT | RECTAL | Status: DC | PRN
  Filled 2014-04-22: qty 28

## 2014-04-22 MED ORDER — METOCLOPRAMIDE HCL 5 MG/ML IJ SOLN
5.0000 mg | Freq: Two times a day (BID) | INTRAMUSCULAR | Status: DC
  Administered 2014-04-22 – 2014-04-25 (×7): 5 mg via INTRAVENOUS
  Filled 2014-04-22 (×4): qty 1
  Filled 2014-04-22 (×2): qty 2
  Filled 2014-04-22: qty 1
  Filled 2014-04-22: qty 2
  Filled 2014-04-22: qty 1

## 2014-04-22 MED ORDER — GERHARDT'S BUTT CREAM
TOPICAL_CREAM | Freq: Two times a day (BID) | CUTANEOUS | Status: DC
  Administered 2014-04-22 – 2014-04-24 (×5): via TOPICAL
  Administered 2014-04-24: 1 via TOPICAL
  Administered 2014-04-25: via TOPICAL
  Administered 2014-04-25: 1 via TOPICAL
  Administered 2014-04-26 (×2): via TOPICAL
  Administered 2014-04-27: 1 via TOPICAL
  Administered 2014-04-27 – 2014-04-28 (×2): via TOPICAL
  Filled 2014-04-22: qty 1

## 2014-04-22 MED ORDER — MORPHINE SULFATE 2 MG/ML IJ SOLN
1.0000 mg | INTRAMUSCULAR | Status: DC
  Administered 2014-04-22 – 2014-04-25 (×17): 1 mg via INTRAVENOUS
  Filled 2014-04-22 (×17): qty 1

## 2014-04-22 MED ORDER — LORAZEPAM 2 MG/ML IJ SOLN: 0.5000 mg | INTRAMUSCULAR | Status: DC | PRN

## 2014-04-22 MED ORDER — ONDANSETRON HCL 4 MG/2ML IJ SOLN
4.0000 mg | Freq: Four times a day (QID) | INTRAMUSCULAR | Status: DC
  Administered 2014-04-22 – 2014-04-25 (×12): 4 mg via INTRAVENOUS
  Filled 2014-04-22 (×12): qty 2

## 2014-04-22 MED ORDER — PANTOPRAZOLE SODIUM 40 MG IV SOLR
40.0000 mg | Freq: Two times a day (BID) | INTRAVENOUS | Status: DC
  Administered 2014-04-22 – 2014-04-23 (×3): 40 mg via INTRAVENOUS
  Filled 2014-04-22 (×4): qty 40

## 2014-04-22 NOTE — Progress Notes (Signed)
OT Cancellation Note  Patient Details Name: [name] MRN: [id number] DOB: [birth year]   Cancelled Treatment:    Reason Eval/Treat Not Completed: Medical issues which prohibited therapy. Pt with profuse bleeding from rectum. Per Palliative Care Physician note pt is expected to experience rapid decline due to bleeding. Goals are now comfort care and minimally evasive treatment. OT to continue to follow and monitor appropriateness of OT sessions.   Hortencia Pilar 04/22/2014, 12:59 PM

## 2014-04-22 NOTE — Progress Notes (Signed)
Having bloody bowel movements.  Not sure why.  No history of hemorrhoids.  Clotted blood associated with stool.  Hemoglobin has not dropped much (12.2 to 11.8).  Will continue to watch

## 2014-04-22 NOTE — Progress Notes (Signed)
Noted medical issues today. We will sign off. [phone]

## 2014-04-22 NOTE — Progress Notes (Addendum)
During bedside report patient asked to use the bathroom. Offgoing nurse and Probation officer assisted patient to [facility] noticed the pad saturated with blood. Patient at that time was given a bath. Once patient stood to return to bed, Probation officer notice the bleeding was coming from her rectum and not vagina as patient assumed it was coming from. BSC was filled with blood and clots.  Notified physician of finding. New order to Hold Lovenox and Stat CBC and stated we will go from there. Niece aware as she called to check on patient. Stated she will be at the hospital soon.

## 2014-04-22 NOTE — Progress Notes (Signed)
Patient ID: [name], female   DOB: [birth year], 80 y.o.   MRN: [id number]   LOS: 7 days   Subjective: Feels weaker than yesterday but otherwise [place]. Had large bloody BM this morning but denies diarrhea.   Objective: Vital signs in last 24 hours: Temp:  [97.7 F (36.5 C)-98.3 F (36.8 C)] 98.3 F (36.8 C) (09/22 0435) Pulse Rate:  [77-87] 87 (09/22 0435) Resp:  [16] 16 (09/22 0435) BP: (134-151)/(84-91) 144/84 mmHg (09/22 0435) SpO2:  [90 %-94 %] 90 % (09/22 0435) Last BM Date: 04/22/14   Physical Exam General appearance: alert and no distress Resp: clear to auscultation bilaterally Cardio: regular rate and rhythm GI: normal findings: bowel sounds normal and soft, non-tender   Assessment/Plan: Fall  C7 TVP fx/T1 fx -- No treatment necessary per NS  Right clav fx -- Appreciate ortho consult, in sling  Multiple right rib fxs -- Pulmonary toilet  Multiple medical problems -- Home meds  FEN -- Checking CBC VTE -- SCD's, D/C Lovenox with GIB Dispo -- Appreciate palliative care involvement, family meeting today at noon. Spoke with dtr.    Lisette Abu, PA-C Pager: [phone] General Trauma PA Pager: [phone]  04/22/2014

## 2014-04-22 NOTE — Progress Notes (Addendum)
Palliative Medicine Team Consult Note  80 yo with multiple medical problems, prior esophageal cancer, frequent falls at home admitted with recent rib fractures and c-spine fractures. [name] is bleeding profusely from her rectum- she has soaked several bed pads. Blood is mixed with large hard pieces of stool. She is starting to become nauseated and dyspneic. Her BP is dropping-she is flushed and diaphoretic. She had a significant amount of pain with turning and moving in the bed.  I met with her Miguel Rota (niece) to discuss goals of care. Casia is too fragile to undergo any invasive medical procedures or interventions. Her rectal bleeding is brisk now-I inspected the area and looked for an external source-possibly a bleeding fissure from passing large stool but due to profuse amounts of blood it was difficult to see the area. Anticoagulation has been held. I stressed the serious nature of [name]'s condition to [place]. [name] agrees to a DNR and to be conservative about how we manage this-putting comfort and dignity/QOL as our primary goals of care. [name] feels that she wants to "put this in god's hands".  I discussed the concept of hospice and prepared [name] for the potential for rapid decline. I also told [name] that I thought she was dying and that this was very serious- her response was that she needed to make an appointment with her primary care doctor and that she was doing "fine before I came to the hospital"-I am not certain she is able to make medical decisions and in general has poor insight into her very poor health.  Assessment:  1. Multiple Falls with associated Orthopedic injuries  Rib Fractures, C-Spine Fractures, T-Spine Fracture 2. Lower GIB, brisk, Rectal pain and cramping 3. Uncontrolled Pain secondary to #1 4. Asthma 5. Frailty, debility 6. Afib 7 CHF, NYHF 1 8. Chronic Nausea 9. Prior History of esophageal cancer  [name] is very frail. Her family report that she is fiercely  independent and would never want a life in a nursing home. She is now having a significant GI bleed-there is very little that can be done for that right now- she is too fragile to undergo colonoscopy and certainly not a surgical candidate-that road would be fraught with complications. Will watch the GIB over night- I think she will decline from this. They are prepared to move her to a hospice house so that this can be managed. Her wish would be to die at home- but I think her care needs would be beyond what her family could handle.  Family concerned and had lots of questions about source of GIB- we could consider a CT scan but this would be purely for information- only and may not provide clear answers. She has a history of esophogeal cancer and chronic anemia. She has known diverticulosis on colonoscopy in 2010- this may be a diverticular bleed.  Recommendations:  1. Scheduled Morphine IV q4 for pain/dyspnea- discontinued all other oral opiates 2. Scheduled Zofran and Reglan IV for nausea 3. Topical barrier cream and Nupercaine  Now DNR.  Given the amount of bleeding Shresta is having I anticipate a rapid decline- will give this another 24 hours and see how her condition evolves. Goals remain comfort care and minimally invasive treatment. She may need transfer to a hospice facility for EOL care if she stabilizes enough for transport.   Will follow closely. Family requesting AM CBC.  Chaplain consulted.  Time: 11:45- 1PM Total Time: 75 minutes. Greater than 50%  of this time was spent  counseling and coordinating care related to the above assessment and plan.   Lane Hacker, DO Palliative Medicine [phone]

## 2014-04-23 ENCOUNTER — Encounter (HOSPITAL_COMMUNITY): Payer: Self-pay | Admitting: Internal Medicine

## 2014-04-23 DIAGNOSIS — Z66 Do not resuscitate: Secondary | ICD-10-CM | POA: Diagnosis present

## 2014-04-23 DIAGNOSIS — J690 Pneumonitis due to inhalation of food and vomit: Secondary | ICD-10-CM | POA: Diagnosis not present

## 2014-04-23 LAB — CBC
HCT: 34.2 % — ABNORMAL LOW (ref 36.0–46.0)
Hemoglobin: 10.7 g/dL — ABNORMAL LOW (ref 12.0–15.0)
MCH: 25.4 pg — ABNORMAL LOW (ref 26.0–34.0)
MCHC: 31.3 g/dL (ref 30.0–36.0)
MCV: 81 fL (ref 78.0–100.0)
PLATELETS: 312 10*3/uL (ref 150–400)
RBC: 4.22 MIL/uL (ref 3.87–5.11)
RDW: 16.4 % — ABNORMAL HIGH (ref 11.5–15.5)
WBC: 9 10*3/uL (ref 4.0–10.5)

## 2014-04-23 LAB — EXPECTORATED SPUTUM ASSESSMENT W GRAM STAIN, RFLX TO RESP C

## 2014-04-23 LAB — EXPECTORATED SPUTUM ASSESSMENT W REFEX TO RESP CULTURE

## 2014-04-23 MED ORDER — GUAIFENESIN 100 MG/5ML PO SOLN
5.0000 mL | ORAL | Status: DC | PRN
  Administered 2014-04-24 – 2014-04-25 (×4): 100 mg via ORAL
  Filled 2014-04-23 (×4): qty 5

## 2014-04-23 MED ORDER — PANTOPRAZOLE SODIUM 40 MG PO TBEC
40.0000 mg | DELAYED_RELEASE_TABLET | Freq: Two times a day (BID) | ORAL | Status: DC
  Administered 2014-04-23 – 2014-04-28 (×10): 40 mg via ORAL
  Filled 2014-04-23 (×10): qty 1

## 2014-04-23 MED ORDER — SUCRALFATE 1 GM/10ML PO SUSP
1.0000 g | Freq: Two times a day (BID) | ORAL | Status: DC
  Administered 2014-04-23 – 2014-04-28 (×10): 1 g via ORAL
  Filled 2014-04-23 (×12): qty 10

## 2014-04-23 NOTE — Progress Notes (Signed)
Patient ID: [name], female   DOB: [birth year], 80 y.o.   MRN: [id number]   LOS: 8 days   Subjective: Looks and feels much better today. Has a mildly productive cough which is new. Rectal bleeding has slowed.   Objective: Vital signs in last 24 hours: Temp:  [98.4 F (36.9 C)-98.6 F (37 C)] 98.6 F (37 C) (09/23 0755) Pulse Rate:  [89-118] 89 (09/23 0755) Resp:  [16-20] 20 (09/23 0755) BP: (108-134)/(63-94) 109/64 mmHg (09/23 0755) SpO2:  [89 %-96 %] 91 % (09/23 0755) Last BM Date: 04/23/14   Laboratory  CBC  Recent Labs  04/22/14 1000 04/23/14 0611  WBC 11.2* 9.0  HGB 11.8* 10.7*  HCT 37.4 34.2*  PLT 296 312    Physical Exam General appearance: alert and no distress Resp: diminished breath sounds LUL Cardio: regular rate and rhythm GI: normal findings: bowel sounds normal and soft, non-tender   Assessment/Plan: Fall  C7 TVP fx/T1 fx -- No treatment necessary per NS  Right clav fx -- Appreciate ortho consult, in sling  Multiple right rib fxs -- Pulmonary toilet, check sputum culture, will not start abx without increased WBC or fever. Multiple medical problems -- Home meds  Rectal bleeding -- Rather modest drop in hgb belies reported severity of bleed.  ABL anemia -- Mild FEN -- No issues VTE -- SCD's Dispo -- Spoke with pt and niece (POA). Pt seems competent to make her own decisions today. Will see if Dr. Hilma Favors thinks hospice is still the best option for her.     Lisette Abu, PA-C Pager: [phone] General Trauma PA Pager: [phone]  04/23/2014

## 2014-04-23 NOTE — Progress Notes (Signed)
Hemoglobin dropped only a small amount.  Will CPM.  Hospice care after discharge.  This patient has been seen and I agree with the findings and treatment plan.  Kathryne Eriksson. Dahlia Bailiff, MD, [place] [phone] (pager) [phone] (direct pager) Trauma Surgeon

## 2014-04-23 NOTE — Progress Notes (Addendum)
Palliative Medicine Team Progress Note   Spoke at length with [name] and [name] family at bedside. While she has improved in terms of [name] pain, less nausea, and slowed lower GI bleeding she still faces very difficult decisions related to QOL and [name] care transition. I talked directly to and about [name] very "broken up" body and [name] actual ability to heal these multiple fractures, sprains and displaced rib fractures- with and already extensive history of recurrent aspiration- [name] prognosis is very poor and this improvement will only be transient. She does not desire aggressive invasive care- [name] desire is to die peacefully and naturally "when it is [name] time and god is ready". [name] wish is to be at home but even with home hospice- [name] pain and immobility are more than [name] family can handle.  I continue to recommend a hospice facility for [name] care- I think she will decline over the next several days -possible a week or two depending on [name] pain needs and continuation of rectal/lower GI Bleeding.   [name] is agreeable to a hospice facility- she knows what this means- she says "I have lived a good life"- she is pleased that [name] pain is better and hopes to get [name] medications "right". Pain with moving and repositioning is most challenging- will need to give [name] PRN doses prior to moving- she asks about applying ice to [name] left hand sprain and taking off the compression wrap. [name] orthopedic injuries will likely never heal.  My impression is that [name] family is having a much harder time accepting the nature of [name] illness and impending death than [name] herself--especially based on my conversation with [name] today.   1. Continue [name] scheduled Roxanol/Morphine SL dosing q4 hours- she is tolerating this very well with minimal side effects - nausea, flushing and diziness are often associated with Tramadol.   2. Continue scheduled zofran and IV reglan for now- will transition to ODT and PO prior to discharge.  3.  Noted that [name] BP is much lower today- this may be from better pain control but also from blood loss anemia not reflected in the CBC.  4. She tolerated the Alprazolam very well last night and per family slept much better last PM.  5. Will resume [name] stool softeners to avoid additional constipation.   Family anxious. Patient is resting comfortably- will await hospice facility eligibility determination and bed availability.  Time: 1PM-1:30PM 30 minutes. Greater than 50%  of this time was spent counseling and coordinating care related to the above assessment and plan.   Lane Hacker, DO Palliative Medicine [phone]

## 2014-04-23 NOTE — Progress Notes (Signed)
04/23/14 1400  Clinical Encounter Type  Visited With Patient;Patient and family together  Visit Type Initial  Referral From Palliative care team  Spiritual Encounters  Spiritual Needs Prayer;Emotional  Stress Factors  Patient Stress Factors None identified  Family Stress Factors None identified   Chaplain visited with patient. Chaplain was referred to patient via Palliative Care team. Chaplain visited with patient and several of her family members, two of which identified as nieces. Patient was in good spirits and had a smile on her face the entire time of my visit. Family members asked for prayer. Chaplain prayed with the family. Patient's nieces were teary after prayer. Chaplain spoke more with the family and the patient and participated in an energizing conversation about the patient's life stories. Chaplain will continue to provide emotional and spiritual support for patient and patient's family as needed.  Gar Ponto, Chaplain 2:46 PM

## 2014-04-24 LAB — COMPREHENSIVE METABOLIC PANEL
ALK PHOS: 67 U/L (ref 39–117)
ALT: 14 U/L (ref 0–35)
AST: 13 U/L (ref 0–37)
Albumin: 2.3 g/dL — ABNORMAL LOW (ref 3.5–5.2)
Anion gap: 8 (ref 5–15)
BILIRUBIN TOTAL: 0.3 mg/dL (ref 0.3–1.2)
BUN: 9 mg/dL (ref 6–23)
CHLORIDE: 97 meq/L (ref 96–112)
CO2: 30 meq/L (ref 19–32)
CREATININE: 0.65 mg/dL (ref 0.50–1.10)
Calcium: 8.5 mg/dL (ref 8.4–10.5)
GFR calc Af Amer: >90 mL/min (ref >90)
GFR, EST NON AFRICAN AMERICAN: 80 mL/min — AB (ref >90)
Glucose, Bld: 132 mg/dL — ABNORMAL HIGH (ref 70–99)
POTASSIUM: 3.4 meq/L — AB (ref 3.7–5.3)
Sodium: 135 mEq/L — ABNORMAL LOW (ref 137–147)
Total Protein: 4.7 g/dL — ABNORMAL LOW (ref 6.0–8.3)

## 2014-04-24 LAB — CBC
HCT: 31.6 % — ABNORMAL LOW (ref 36.0–46.0)
Hemoglobin: 9.9 g/dL — ABNORMAL LOW (ref 12.0–15.0)
MCH: 25.1 pg — ABNORMAL LOW (ref 26.0–34.0)
MCHC: 31.3 g/dL (ref 30.0–36.0)
MCV: 80.2 fL (ref 78.0–100.0)
PLATELETS: 285 10*3/uL (ref 150–400)
RBC: 3.94 MIL/uL (ref 3.87–5.11)
RDW: 15.9 % — ABNORMAL HIGH (ref 11.5–15.5)
WBC: 7.4 10*3/uL (ref 4.0–10.5)

## 2014-04-24 LAB — GLUCOSE, CAPILLARY: Glucose-Capillary: 119 mg/dL — ABNORMAL HIGH (ref 70–99)

## 2014-04-24 NOTE — Clinical Social Work Note (Addendum)
2:01pm- CSW was notified by Yetta Glassman [facility]) that she will be doing bedside assessment this afternoon.  Beverlee Nims [facility]) to follow up with CSW after evaluation and family (POA) conversation.   11:03am- Family/patient has been referred for residential hospice.  Family and patient have been offered choice.  [name], niece, POA and pt have agreed on [facility].  Referral has been made to Yetta Glassman.  Nonnie Done, [place] [phone]  Psychiatric & Orthopedics (5N 1-16) Clinical Social Worker

## 2014-04-24 NOTE — Progress Notes (Signed)
I spoke with her family Patient examined and I agree with the assessment and plan  Georganna Skeans, MD, MPH, FACS Trauma: [phone] General Surgery: [phone]  04/24/2014 2:51 PM

## 2014-04-24 NOTE — Progress Notes (Signed)
[facility] Trauma Service  Progress Note   LOS: 9 days   Subjective: Awake and alert without complaints other than rib pain.  No N/V.  Having good urine and bowel function.  Tolerating a regular diet, but appetite low.  Mobilizing with PT/OT & nursing staff.  Small amount of blood in stools on the first BM this am, but none in the most recent BM at 10:40am.  Objective: Vital signs in last 24 hours: Temp:  [97.7 F (36.5 C)-98.2 F (36.8 C)] 97.7 F (36.5 C) (09/24 0600) Pulse Rate:  [76-90] 83 (09/24 0600) Resp:  [16-18] 16 (09/24 0600) BP: (89-104)/(52-58) 102/57 mmHg (09/24 0600) SpO2:  [92 %-95 %] 92 % (09/24 0600) Last BM Date: 04/23/14  Lab Results:  CBC  Recent Labs  04/22/14 1000 04/23/14 0611  WBC 11.2* 9.0  HGB 11.8* 10.7*  HCT 37.4 34.2*  PLT 296 312   BMET No results found for this basename: NA, K, CL, CO2, GLUCOSE, BUN, CREATININE, CALCIUM,  in the last 72 hours  Imaging: No results found.   PE: General: pleasant, WD/WN white female who is laying in bed in NAD HEENT: head hangs low to the right Heart: regular, rate, and rhythm.  Normal s1,s2. No obvious murmurs, gallops, or rubs noted.  Palpable radial and pedal pulses bilaterally Lungs: CTAB, no wheezes, rhonchi, or rales noted.  Respiratory effort minimal Abd: soft, mild tenderness in upper abdomen over ribs, ND, +BS, no masses, hernias, or organomegaly Psych: Alert and oriented to self/place.     Assessment/Plan: Fall  C7 TVP fx/T1 fx -- No treatment necessary per NS  Right clav fx -- Appreciate ortho consult, in sling  Multiple right rib fxs -- Pulmonary toilet, check sputum culture, will not start abx without increased WBC or fever.  Multiple medical problems -- Home meds  Rectal bleeding -- Low drop in Hgb despite rectal bleeding, recheck Hgb/Hct ABL anemia -- Mild  FEN -- No issues  VTE -- SCD's   Dispo -- Legrand Como spoke with pt and niece (POA) she's still on board with  proceeding with hospice.  Pt seems competent to make her own decisions today. Will see if Dr. Hilma Favors thinks hospice is still the best option for her vs SNF.      Coralie Keens, PA-C Pager: [phone] General Trauma PA Pager: [phone]   04/24/2014

## 2014-04-25 DIAGNOSIS — F19921 Other psychoactive substance use, unspecified with intoxication with delirium: Secondary | ICD-10-CM

## 2014-04-25 DIAGNOSIS — Z515 Encounter for palliative care: Secondary | ICD-10-CM

## 2014-04-25 DIAGNOSIS — D62 Acute posthemorrhagic anemia: Secondary | ICD-10-CM

## 2014-04-25 DIAGNOSIS — T50905A Adverse effect of unspecified drugs, medicaments and biological substances, initial encounter: Secondary | ICD-10-CM | POA: Diagnosis not present

## 2014-04-25 LAB — CBC
HCT: 32.7 % — ABNORMAL LOW (ref 36.0–46.0)
HEMOGLOBIN: 10.3 g/dL — AB (ref 12.0–15.0)
MCH: 24.7 pg — ABNORMAL LOW (ref 26.0–34.0)
MCHC: 31.5 g/dL (ref 30.0–36.0)
MCV: 78.4 fL (ref 78.0–100.0)
PLATELETS: 332 10*3/uL (ref 150–400)
RBC: 4.17 MIL/uL (ref 3.87–5.11)
RDW: 16 % — ABNORMAL HIGH (ref 11.5–15.5)
WBC: 8.3 10*3/uL (ref 4.0–10.5)

## 2014-04-25 MED ORDER — METOCLOPRAMIDE HCL 5 MG PO TABS
5.0000 mg | ORAL_TABLET | Freq: Three times a day (TID) | ORAL | Status: DC
  Administered 2014-04-26 – 2014-04-28 (×8): 5 mg via ORAL
  Filled 2014-04-25 (×11): qty 1

## 2014-04-25 MED ORDER — MORPHINE SULFATE (CONCENTRATE) 10 MG /0.5 ML PO SOLN: 10.0000 mg | ORAL | Status: DC | PRN

## 2014-04-25 MED ORDER — MORPHINE SULFATE (CONCENTRATE) 10 MG /0.5 ML PO SOLN
10.0000 mg | ORAL | Status: DC
  Administered 2014-04-25 – 2014-04-26 (×5): 10 mg via ORAL
  Filled 2014-04-25 (×6): qty 0.5

## 2014-04-25 MED ORDER — LIDOCAINE 5 % EX PTCH
1.0000 | MEDICATED_PATCH | CUTANEOUS | Status: DC
  Administered 2014-04-25 – 2014-04-27 (×3): 1 via TRANSDERMAL
  Filled 2014-04-25 (×5): qty 1

## 2014-04-25 MED ORDER — MORPHINE SULFATE 2 MG/ML IJ SOLN: 2.0000 mg | INTRAMUSCULAR | Status: DC | PRN

## 2014-04-25 MED ORDER — ONDANSETRON 4 MG PO TBDP
4.0000 mg | ORAL_TABLET | Freq: Two times a day (BID) | ORAL | Status: DC
  Administered 2014-04-25 – 2014-04-27 (×4): 4 mg via ORAL
  Filled 2014-04-25 (×6): qty 1

## 2014-04-25 NOTE — Progress Notes (Signed)
Patient examined and I agree with the assessment and plan  Georganna Skeans, MD, MPH, FACS Trauma: [phone] General Surgery: [phone]  04/25/2014 11:53 AM

## 2014-04-25 NOTE — Progress Notes (Signed)
Medicare IM (Important Message) delivered to patient today by me in anticipation of discharge.   Zyren Sevigny, RN BSN MHA CCM  Case Manager, Trauma Service/Unit 3M [phone]  

## 2014-04-25 NOTE — Progress Notes (Signed)
Palliative Medicine Team Progress Note  [name] is OOB in a chair-her pain is now reported at a 10/10 and she looks to be in distress- she is coughing and clearly splinting. She reports her rib pain is main source. She is diaphoretic. I am also concerned because she does not remember any conversation with Hospice yesterday nor does she remember the discussion I had with her about options for her care. She did not know she had rib fractures- this is concerning for early signs of delirium (family reports knowing that she has had signs of dementia for many months) and may also be medication side effects or developing infection etc.. She is extremely fragile.   She is currently receiving scheduled IV pain medication- she cannot have IV pain meds at SNF so we need to transition these before she leaves the hospital or she may be a high risk for re-admission. I also think it is reasonable to monitor her signs of delirium for at least another 24 hours.  Recommendations:  1. Will ask [facility] to re-evaluate- family ok with her going to [facility] SNF once pain is under control and right now [name] is "10/10".  2. I will transition her to scheduled Roxanol at equivalent dosing. Will transition her IV meds to PO ie. reglan and zofran.  3. Continue current medications on her MAR- would not resume any of the prior home meds other than what she is getting here in the hospital.  [facility] can follow her at SNF (palliative) if that is the direction that her family wants to go or if she continues to not meet inpatient hospice criteria.  5. She continues to have bloody stools- but her Hb is stable. For prognostic reasons we should possibly consider non-invasive evaluation of her abdomen(CT) to see if there is an obvious source of GIB.She has a history esophageal cancer with extensive GE surgery and pull through.  Following closely and working with her family on the care transition.  12PM-12:40PM Greater than 50%  of  this time was spent counseling and coordinating care related to the above assessment and plan.  Lane Hacker, DO Palliative Medicine

## 2014-04-25 NOTE — Progress Notes (Signed)
Palliative Medicine Team  Case discussed with Brynda Rim with [facility] regarding placement at [facility]. [place] has not fully declared her self in terms of her prognosis- so [facility] has for now declined admission for EOL symptom management and care- but is willing to offer her Hospice Services at SNF or at home- she is very appropriate for Hospice- but typically for hospice house admission there needs to be a <2 week prognosis and active symptoms to manage. We have made improvements in her pain management and that iis fairly stable- at least when she is not moving. I requested that we obtain another Hb since she is still having some GIBing and also try to mobilize her- she is eating minimal amounts of food and I suspect malnutrition- will check an albumin for prognostic reasons as well.  Hb 9.9 Albumin 2.3 Her BP is dropping as well 94/57.   It is really only a matter of time before she has an aspiration eventt or PNA from splinting or oozes down her Hb - I do not think these fractures will heal.   Given that she may not be eligible at this moment for inpatient hospice it would be reasonable to get PT to eval and mobilize her so we can see what her pain mangement needs are going to be moving forward.  I will see family tomorrow and encourage them to think about other Hospice options and possibilities. Will also touch base with [facility] with update on her condition.  Lane Hacker, DO Palliative Medicine [phone]

## 2014-04-25 NOTE — Clinical Social Work Note (Signed)
CSW spoke with POA, [name] who states that she has spoken with [facility] where she wishes for pt to be dc'd to once pt is medically stable.  CSW will f/u with SNF.  Nonnie Done, [place] [phone]  Psychiatric & Orthopedics (5N 1-16) Clinical Social Worker

## 2014-04-25 NOTE — Progress Notes (Signed)
[facility] Trauma Service  Progress Note   LOS: 10 days   Subjective: Pt feels good, alert, very appropriate.  No N/V.  Tolerating diet.  C/o pain in ribs and neck.  Sitting up in the chair.  Having BM's and urinating well.    Objective: Vital signs in last 24 hours: Temp:  [98.2 F (36.8 C)] 98.2 F (36.8 C) (09/25 0453) Pulse Rate:  [77-82] 80 (09/25 0453) Resp:  [16-18] 16 (09/25 0453) BP: (94-113)/(55-64) 113/55 mmHg (09/25 0453) SpO2:  [95 %-96 %] 96 % (09/25 0453) Last BM Date: 04/24/14  Lab Results:  CBC  Recent Labs  04/24/14 1600 04/25/14 0714  WBC 7.4 8.3  HGB 9.9* 10.3*  HCT 31.6* 32.7*  PLT 285 332   BMET  Recent Labs  04/24/14 1600  NA 135*  K 3.4*  CL 97  CO2 30  GLUCOSE 132*  BUN 9  CREATININE 0.65  CALCIUM 8.5    Imaging: No results found.   PE: General: pleasant, WD/WN white female who is laying in bed in NAD  HEENT: head hangs low to the right  Heart: regular, rate, and rhythm. Normal s1,s2. No obvious murmurs, gallops, or rubs noted. Palpable radial and pedal pulses bilaterally  Lungs: CTAB, no wheezes, rhonchi, or rales noted. Respiratory effort minimal.  Uses flutter valve. Abd: soft, mild tenderness in upper abdomen over ribs, ND, +BS, no masses, hernias, or organomegaly  Psych: Alert and oriented to self/place.     Assessment/Plan: Fall  C7 TVP fx/T1 fx -- No treatment necessary per NS  Right clav fx -- Appreciate ortho consult, in sling  Multiple right rib fxs -- Pulmonary toilet Multiple medical problems -- Home meds  Rectal bleeding -- Low drop in Hgb despite rectal bleeding, recheck Hgb/Hct  ABL anemia -- Improved despite GIB Deconditioning -- PT/OT eval FEN -- No issues  VTE -- SCD's  Dispo -- Palliative care following.  Not sure if they want her back on her home meds or not.  Patient does not qualify for resident hospice given her improvement.  Will place Social work consult for SNF placement.  Ready  for discharge when bed available     Excell Seltzer Pager: [place] PA Pager: [phone]   04/25/2014

## 2014-04-26 ENCOUNTER — Inpatient Hospital Stay (HOSPITAL_COMMUNITY): Payer: Medicare Other

## 2014-04-26 ENCOUNTER — Encounter (HOSPITAL_COMMUNITY): Payer: Self-pay | Admitting: Radiology

## 2014-04-26 MED ORDER — LIDOCAINE 5 % EX PTCH: 1.0000 | MEDICATED_PATCH | CUTANEOUS | Status: AC

## 2014-04-26 MED ORDER — ONDANSETRON HCL 4 MG/2ML IJ SOLN
4.0000 mg | Freq: Once | INTRAMUSCULAR | Status: AC
  Administered 2014-04-26: 4 mg via INTRAVENOUS
  Filled 2014-04-26: qty 2

## 2014-04-26 MED ORDER — IOHEXOL 300 MG/ML  SOLN
25.0000 mL | INTRAMUSCULAR | Status: AC
  Administered 2014-04-26 (×2): 25 mL via ORAL

## 2014-04-26 MED ORDER — TRAMADOL HCL 50 MG PO TABS
50.0000 mg | ORAL_TABLET | Freq: Four times a day (QID) | ORAL | Status: DC | PRN
  Filled 2014-04-26: qty 2

## 2014-04-26 MED ORDER — GUAIFENESIN 100 MG/5ML PO SOLN: 5.0000 mL | ORAL | Status: AC | PRN

## 2014-04-26 MED ORDER — MORPHINE SULFATE (CONCENTRATE) 10 MG /0.5 ML PO SOLN
15.0000 mg | ORAL | Status: DC
  Administered 2014-04-26 (×2): 15 mg via ORAL
  Administered 2014-04-26: 20:00:00 via ORAL
  Administered 2014-04-26 – 2014-04-27 (×5): 15 mg via ORAL
  Filled 2014-04-26 (×7): qty 1

## 2014-04-26 MED ORDER — IOHEXOL 300 MG/ML  SOLN
80.0000 mL | Freq: Once | INTRAMUSCULAR | Status: AC | PRN
  Administered 2014-04-26: 80 mL via INTRAVENOUS

## 2014-04-26 MED ORDER — MORPHINE SULFATE (CONCENTRATE) 10 MG /0.5 ML PO SOLN: 5.0000 mg | ORAL | Status: DC | PRN

## 2014-04-26 MED ORDER — METOCLOPRAMIDE HCL 5 MG/ML IJ SOLN: 5.0000 mg | Freq: Once | INTRAMUSCULAR | Status: DC

## 2014-04-26 MED ORDER — MORPHINE SULFATE 2 MG/ML IJ SOLN
2.0000 mg | INTRAMUSCULAR | Status: DC | PRN
  Administered 2014-04-26 – 2014-04-27 (×3): 2 mg via INTRAVENOUS
  Filled 2014-04-26 (×3): qty 1

## 2014-04-26 MED ORDER — SENNOSIDES-DOCUSATE SODIUM 8.6-50 MG PO TABS: 1.0000 | ORAL_TABLET | Freq: Every evening | ORAL | Status: AC | PRN

## 2014-04-26 MED ORDER — SENNOSIDES-DOCUSATE SODIUM 8.6-50 MG PO TABS: 1.0000 | ORAL_TABLET | Freq: Every evening | ORAL | Status: DC | PRN

## 2014-04-26 NOTE — Progress Notes (Signed)
Subjective: Complaining of chest pain and sore all over.  Pain worse with palpation.  No SOB  Objective: Vital signs in last 24 hours: Temp:  [97.9 F (36.6 C)-99.1 F (37.3 C)] 98.4 F (36.9 C) (09/26 0559) Pulse Rate:  [80-86] 86 (09/26 0559) Resp:  [16] 16 (09/26 0559) BP: (106-149)/(64-83) 149/83 mmHg (09/26 0559) SpO2:  [96 %-98 %] 96 % (09/26 0559) Last BM Date: 04/24/14 240 PO recorded; 2 BM's recorded TM 99.1 H/H stable and improving Diet: regular She is getting Morphine concentrate and about twice a day.  She has tramadol listed but I dont see where she has used it. Intake/Output from previous day: 09/25 0701 - 09/26 0700 In: 240 [P.O.:240] Out: -  Intake/Output this shift:    General appearance: alert, cooperative and complaining of pain all over pointing to chest. Resp: clear to auscultation bilaterally Chest wall: no tenderness, she is tender all over, with areas of ecchymosis over chest. Cardio: regular rate and rhythm, S1, S2 normal, no murmur, click, rub or gallop GI: soft, non-tender; bowel sounds normal; no masses,  no organomegaly Skin: brusing all over  Lab Results:   Recent Labs  04/24/14 1600 04/25/14 0714  WBC 7.4 8.3  HGB 9.9* 10.3*  HCT 31.6* 32.7*  PLT 285 332    BMET  Recent Labs  04/24/14 1600  NA 135*  K 3.4*  CL 97  CO2 30  GLUCOSE 132*  BUN 9  CREATININE 0.65  CALCIUM 8.5   PT/INR No results found for this basename: LABPROT, INR,  in the last 72 hours   Recent Labs Lab 04/24/14 1600  AST 13  ALT 14  ALKPHOS 67  BILITOT 0.3  PROT 4.7*  ALBUMIN 2.3*     Lipase     Component Value Date/Time   LIPASE 28.0 06/12/2012 1121     Studies/Results: No results found.  Medications: . ALPRAZolam  0.25 mg Oral QHS  . amiodarone  200 mg Oral Daily  . [name]'s butt cream   Topical BID  . lidocaine  1 patch Transdermal Q24H  . metoCLOPramide  5 mg Oral TID AC  . morphine CONCENTRATE  10 mg Oral Q4H  .  ondansetron  4 mg Oral Q12H  . pantoprazole  40 mg Oral BID  . sodium chloride  3 mL Intravenous Q12H  . sucralfate  1 g Oral BID    Prior to Admission medications   Medication Sig Start Date End Date Taking? Authorizing Provider  acetaminophen (TYLENOL) 500 MG tablet Take 1 tablet (500 mg total) by mouth every 6 (six) hours as needed for headache. 12/02/13  Yes Tammy S Parrett, NP  albuterol (PROVENTIL HFA;VENTOLIN HFA) 108 (90 BASE) MCG/ACT inhaler Inhale 2 puffs into the lungs every 6 (six) hours as needed. For wheezing 12/02/13  Yes Tammy S Parrett, NP  amiodarone (PACERONE) 200 MG tablet Take 1 tablet (200 mg total) by mouth daily. 04/14/14  Yes Timoteo Gaul, FNP  chlorpheniramine-HYDROcodone (TUSSIONEX PENNKINETIC ER) 10-8 MG/5ML LQCR 1 tsp every 12 hours as needed 04/14/14  Yes Tanda Rockers, MD  ergocalciferol (VITAMIN D2) 50000 UNITS capsule Take 1 capsule (50,000 Units total) by mouth every Friday. 04/01/14  Yes Timoteo Gaul, FNP  Fe Fum-FA-B Cmp-C-Zn-Mg-Mn-Cu (HEMOCYTE-PLUS) 106-1 MG TABS Take 1 capsule by mouth 2 (two) times daily. 04/14/14  Yes Timoteo Gaul, FNP  furosemide (LASIX) 40 MG tablet Take 1 tablet (40 mg total) by mouth daily. 04/01/14  Yes Timoteo Gaul,  FNP  lansoprazole (PREVACID) 15 MG capsule Take 2 capsules (30 mg total) by mouth 2 (two) times daily before a meal. 04/14/14  Yes Inda Castle, MD  methylPREDNIsolone (MEDROL DOSPACK) 4 MG tablet Take by mouth See admin instructions. Take 6 tabs on the first day, tapering by 1 tablet each day for 6 days. 04/01/14  Yes Historical Provider, MD  metoCLOPramide (REGLAN) 5 MG tablet Take 5 mg by mouth 2 (two) times daily. 1 hour before breakfast and supper   Yes Historical Provider, MD  Multiple Vitamins-Calcium (VIACTIV MULTI-VITAMIN) CHEW Chew 1 each by mouth daily. 12/02/13  Yes Tammy S Parrett, NP  Olopatadine HCl (PATADAY) 0.2 % SOLN Apply 1-2 drops to eye 2 (two) times daily as needed (dry, itchy eyes.).   Yes  Historical Provider, MD  ondansetron (ZOFRAN) 4 MG tablet Take 1 tablet (4 mg total) by mouth at bedtime. 04/01/14  Yes Timoteo Gaul, FNP  sucralfate (CARAFATE) 1 GM/10ML suspension Take 10 mLs (1 g total) by mouth 2 (two) times daily before a meal. 01/09/14  Yes Delfina Redwood, MD  traMADol (ULTRAM) 50 MG tablet Take 1 tablet (50 mg total) by mouth every 8 (eight) hours as needed for moderate pain. 04/01/14  Yes Timoteo Gaul, FNP  zolpidem (AMBIEN) 5 MG tablet Take 5 mg by mouth at bedtime.   Yes Historical Provider, MD    Assessment/Plan 1.  Fall  2.  C7 Transverse Process fx/T1 fx -- No treatment necessary per Dr Saintclair Halsted,  Neurosurgery  3.  Comminuted nondisplaced distal RIGHT clavicle fracture.  , in sling she can remove sling to bathe, Follow up with Dr. Gladstone Lighter in 2  weeks in the office 04/17/14) 4.Multiple right rib fxs --(Right fourth, fifth and sixth posterior rib fractures. The  fifth and sixth rib fractures are displaced. No pneumothorax) treatment:  Pulmonary toilet   5.  GERD/Hiatal hernia 6.  Hx of esophageal cancer with resection 7.  Hx of dysrhythmias --  8.  Asthma/Bronchitis/aspiration pneumonia 9.  Osteoporosis 10. Mild Rectal bleeding/anemia  -- with stable H/H 11.  Deconditioning -- PT/OT eval 12.  Lovenox for DVT  Plan:  We will be sure she has adequate pain medicines for all her various injuries.  From our standpoint she is ready for discharge to SNF.  She can follow up with her PCP, and Dr. Gladstone Lighter.     LOS: 11 days    [name] 04/26/2014

## 2014-04-26 NOTE — Discharge Summary (Signed)
Amariona Rathje, MD, MPH, FACS Trauma: [phone] General Surgery: [phone]  

## 2014-04-26 NOTE — Progress Notes (Signed)
Call received from patient's HCPOA last PM to PMT phone. She was upset because someone from [facility] called her to ask her if she knew that Marcelyn was being discharged late last PM-she had not been made aware of this plan. I provided reassurance that I would make sure Arieal was stable for discharge and that her pain was under at least adequate control and not 10/10 prior to being transported and also arrange for someone from [facility]/Palliative Care to see her at [facility] on Monday. I also think it would be reasonable to make sure her bowel movements are not bloody to prevent a re-admission. If SNF sees a bright red blood stool they will likely send her back to the hospital-I provided education to family regarding goals of care previously established for gentle medical treatment and rehab if Brailey desires this with comfort and QOL being the primary goal.   Will meet daughter at [place], [facility] [phone]

## 2014-04-26 NOTE — Discharge Instructions (Signed)
Continue Incentive spirometry at home.  Every hour 10-15 repetitions per hour when your awake. Continue the sling on your right arm.  You can take it out to bathe, and put it back when your are up and out of bed. The more ambulation you can do the better.  You will have PT and OT at the rehab facility to assist you with this. Use the pain medicine as you require it.

## 2014-04-26 NOTE — Progress Notes (Signed)
Spoke with Dr. Hilma Favors. She feels palliative is best and wants to take her on their service. Appreciate their help. Patient examined and I agree with the assessment and plan  Georganna Skeans, MD, MPH, FACS Trauma: [phone] General Surgery: [phone]  04/26/2014 11:27 AM

## 2014-04-26 NOTE — Progress Notes (Signed)
PT Cancellation Note  Patient Details Name: [name] MRN: [id number] DOB: January 29, 1929   Cancelled Treatment:    Reason Eval/Treat Not Completed: Pain limiting ability to participate.  Spoke with MD who requested we defer PT today. Patient in a lot of pain today.  MD still working toward hospice.  Will monitor situation over weekend and assess as appropriate on Monday.   Shanna Cisco, [place] 04/26/2014, 11:30 AM

## 2014-04-26 NOTE — Progress Notes (Addendum)
Palliative Care Team at [facility] Note   SUBJECTIVE: "Horrible Pain", "I cant take this". "Please help me". Had a large bloody bowel movement yesterday.  +abdominal pain, + chest pain that has moved up. Worsening cough-now with yellow productive cough.   Interval Events: Palliative Consult 9/22  OBJECTIVE: Vital Signs: BP 149/83  Pulse 86  Temp(Src) 98.4 F (36.9 C) (Oral)  Resp 16  Ht 5' (1.524 m)  Wt 49.896 kg (110 lb)  BMI 21.48 kg/m2  SpO2 96%   Intake and Output: 09/25 0701 - 09/26 0700 In: 240 [P.O.:240] Out: 700 [Urine:700]  Physical Exam: General: Vital signs reviewed and noted. Well-developed, well-nourished, in no acute distress; alert, appropriate and cooperative throughout examination.  Head: Normocephalic, atraumatic.  Lungs:  Normal respiratory effort. Clear to auscultation BL without crackles or wheezes.  Heart: RRR. S1 and S2 normal without gallop,  or rubs. (+) murmur  Abdomen:  BS normoactive. Soft, Nondistended, non-tender.  No masses or organomegaly.  Extremities: No pretibial edema.    Allergies  Allergen Reactions  . Ensure [Nutritional Supplements]     "gives me diarrhea"  . Erythromycin Ethylsuccinate     Irregular pulse rate  . Prednisone     "makes me hyper"  . Doxycycline Rash    Medications: Scheduled Meds:  . ALPRAZolam  0.25 mg Oral QHS  . amiodarone  200 mg Oral Daily  . [name]'s butt cream   Topical BID  . lidocaine  1 patch Transdermal Q24H  . metoCLOPramide  5 mg Oral TID AC  . morphine CONCENTRATE  15 mg Oral 6 times per day  . ondansetron  4 mg Oral Q12H  . pantoprazole  40 mg Oral BID  . sodium chloride  3 mL Intravenous Q12H  . sucralfate  1 g Oral BID    Continuous Infusions:    PRN Meds: sodium chloride, albuterol, dibucaine, guaiFENesin, LORazepam, morphine injection, senna-docusate, sodium chloride  Stool Softner: yes   Palliative Performance Scale: 30%   Labs: CBC    Component Value  Date/Time   WBC 8.3 04/25/2014 0714   RBC 4.17 04/25/2014 0714   HGB 10.3* 04/25/2014 0714   HCT 32.7* 04/25/2014 0714   PLT 332 04/25/2014 0714   MCV 78.4 04/25/2014 0714   MCH 24.7* 04/25/2014 0714   MCHC 31.5 04/25/2014 0714   RDW 16.0* 04/25/2014 0714   LYMPHSABS 1.0 04/16/2014 0211   MONOABS 1.0 04/16/2014 0211   EOSABS 0.1 04/16/2014 0211   BASOSABS 0.0 04/16/2014 0211    CMET     Component Value Date/Time   NA 135* 04/24/2014 1600   K 3.4* 04/24/2014 1600   CL 97 04/24/2014 1600   CO2 30 04/24/2014 1600   GLUCOSE 132* 04/24/2014 1600   BUN 9 04/24/2014 1600   CREATININE 0.65 04/24/2014 1600   CREATININE 0.75 01/15/2013 1222   CALCIUM 8.5 04/24/2014 1600   PROT 4.7* 04/24/2014 1600   ALBUMIN 2.3* 04/24/2014 1600   AST 13 04/24/2014 1600   ALT 14 04/24/2014 1600   ALKPHOS 67 04/24/2014 1600   BILITOT 0.3 04/24/2014 1600   GFRNONAA 80* 04/24/2014 1600   GFRAA >90 04/24/2014 1600    ASSESSMENT/ PLAN:  1. Multiple Falls with associated Orthopedic injuries  Rib (displaced) fractures, C-Spine Fractures, T-Spine Fracture  2. Lower GIB, brisk,suprisingly Hb has been stable, Rectal pain and cramping  3. Uncontrolled Pain secondary to #1  4. Asthma  5. Frailty, debility  6. Afib  7 CHF, NYHF 1  8. Chronic Nausea  9. Prior History of esophageal cancer 10. Malnutrition, Low albumin 2.3 11. Osteoporosis   Keirstin is declining. Her pain is not well controlled-10/10 now in her upper chest and sternum and is associated with dyspnea-she is splinting. No AM labs. 1 large BM with blood yesterday.  Confusion today over her discharge to [facility] recommended yesterday to make sure we had her on an acceptabel oral pain regimen and that bloody stools had resolved (at least bright red)-SNF would likely send her back to hospital immediately if these things are not addressed and she is not medically stable for transport at this time.   I resumed her oral Morphine 10mg  q4 hours SL scheduled.  Stopped Tramdol  again due to nausea and will add this to her intolerance list.  Enid Derry asked for IV medicine - "it works better and I am having reflux of the liquid medicine".  Madalynn has remained remarkably lucid through all of this but when getting adequate amounts of pain control she is not able to make her own decisions and does have issues with memory loss.  I continue to feel she is appropriate for GIP level hospice care for her pain management and I anticipate a rapid decline with aspiration and PNA as well as acute blood loss anemia. Will ask [facility] to re-review case and also make sure [facility] received a referral for evaluation as well.  I have decided to order a Chest CT and an Abdominal CT for prognostication support.  60 minutes at bedside- 1100AM-12PM Greater than 50%  of this time was spent counseling and coordinating care related to the above assessment and plan.   Acquanetta Chain, DO  04/26/2014, 11:42 AM  Please contact Palliative Medicine Team phone at [phone] for questions and concerns.

## 2014-04-26 NOTE — Discharge Summary (Signed)
Antario Yasuda, MD, MPH, FACS Trauma: [phone] General Surgery: [phone]  

## 2014-04-26 NOTE — Discharge Summary (Addendum)
Physician Discharge Summary  Patient ID: [name] MRN: [id number] DOB/AGE: 78/78/1930 78 y.o.  Admit date: 04/15/2014 Discharge date: 04/26/2014  Admission Diagnoses:  Fall with the following injuries:  1. Right distal clavicle fracture  2. Right rib fracture(s)  3. T3 fracture.   Discharge Diagnoses:  1. Fall  2. C7 Transverse Process fx/T1 fx -- No treatment necessary per Dr Saintclair Halsted, Neurosurgery  3. Comminuted nondisplaced distal RIGHT clavicle fracture.  , in sling she can remove sling to bathe, Follow up with Dr. Gladstone Lighter in 2 weeks in the office 04/17/14)  4.Multiple right rib fxs --(Right fourth, fifth and sixth posterior rib fractures. The fifth and sixth rib fractures are displaced. No pneumothorax) treatment: Pulmonary toilet  5. GERD/Hiatal hernia  6. Hx of esophageal cancer with resection  7. Hx of dysrhythmias --  8. Asthma/Bronchitis/aspiration pneumonia  9. Osteoporosis  10. Mild Rectal bleeding/anemia -- with stable H/H  11. Deconditioning --   Active Problems:   HYPERTENSION   HX OF ESOPHAGEAL CANCER   Asthma with bronchitis   Gastric outlet obstruction   Aortic regurgitation   Anticoagulant long-term use   A-fib   Acute blood loss anemia   Chronic diastolic CHF (congestive heart failure)   Multifactorial gait disorder   Rib fracture   Fall   T1 vertebral fracture   Right clavicle fracture   Cervical transverse process fracture   DNR (do not resuscitate)   Lower GI hemorrhage   Recurrent aspiration pneumonia   Delirium, drug-induced   PROCEDURES: None  Hospital Course: 80 y/o woman who  was walking with her walker at home and caught this on the carpet. She fell back onto her mid back and shoulders. She subsequently was brought to the emergency department at [facility]. X-rays demonstrated a right distal clavicle fracture, a T3 fracture, and one, possibly 2 rib fractures on the right as well. She does fall intermittently and has a left wrist  injury which is being treated by Dr. Gladstone Lighter who is her Orthopedic Surgeon. Because of the multiple fractures, we were asked to see her. Her pain is adequately controlled with morphine. She did not hit her head. She denies loss of consciousness.  She was admitted with multiple fracture for pain control  As note above she has been seen by Neurosurgery, Dr. Saintclair Halsted and cleared with no further treatment needed.  She was also seen by Dr. Gladstone Lighter for her clavicle fracture and place in a sling.  She can remove this for bathing and sleep.   She was considered for palliative care/hospice but was not considered a candidate for Hospice and a SNF has been arranged.  She is on Pulmonary toilet for her fractured ribs along with oral morphine and tramadol which she was on before admission.  She has also been started on Lidocaine patches for pain also.  She had some occult positive stools, but she has been H/h has been stable and improving.  She will need ongoing OT/PT treatments at the SNF.  She thinks she cannot do anything alone, but she can do more than she gives herself credit for and needs encouragement.  Dr. Hilma Favors has been discussing her care with the family and after further discussion today she is going to be transferred to palliative service for symptom management and to maintain goals of care. Follow up is listed below along with discharge medicines.  Condition on D/C:  Improved   Disposition: Skilled nursing facility     Medication List  STOP taking these medications       methylPREDNIsolone 4 MG tablet  Commonly known as:  MEDROL DOSPACK      TAKE these medications       acetaminophen 500 MG tablet  Commonly known as:  TYLENOL  Take 1 tablet (500 mg total) by mouth every 6 (six) hours as needed for headache.     albuterol 108 (90 BASE) MCG/ACT inhaler  Commonly known as:  PROVENTIL HFA;VENTOLIN HFA  Inhale 2 puffs into the lungs every 6 (six) hours as needed. For wheezing     amiodarone  200 MG tablet  Commonly known as:  PACERONE  Take 1 tablet (200 mg total) by mouth daily.     chlorpheniramine-HYDROcodone 10-8 MG/5ML Lqcr  Commonly known as:  TUSSIONEX PENNKINETIC ER  1 tsp every 12 hours as needed     ergocalciferol 50000 UNITS capsule  Commonly known as:  VITAMIN D2  Take 1 capsule (50,000 Units total) by mouth every Friday.     furosemide 40 MG tablet  Commonly known as:  LASIX  Take 1 tablet (40 mg total) by mouth daily.     guaiFENesin 100 MG/5ML Soln  Commonly known as:  ROBITUSSIN  Take 5 mLs (100 mg total) by mouth every 4 (four) hours as needed for cough or to loosen phlegm.     HEMOCYTE-PLUS 106-1 MG Tabs  Take 1 capsule by mouth 2 (two) times daily.     lansoprazole 15 MG capsule  Commonly known as:  PREVACID  Take 2 capsules (30 mg total) by mouth 2 (two) times daily before a meal.     lidocaine 5 %  Commonly known as:  LIDODERM  Place 1 patch onto the skin daily. Remove & Discard patch within 12 hours or as directed by MD     metoCLOPramide 5 MG tablet  Commonly known as:  REGLAN  Take 5 mg by mouth 2 (two) times daily. 1 hour before breakfast and supper     morphine CONCENTRATE 10 mg / 0.5 ml concentrated solution  Take 0.25-0.5 mLs (5-10 mg total) by mouth every 4 (four) hours as needed for moderate pain or severe pain (Hold for HR less than 65 or BP less than 100).     ondansetron 4 MG tablet  Commonly known as:  ZOFRAN  Take 1 tablet (4 mg total) by mouth at bedtime.     PATADAY 0.2 % Soln  Generic drug:  Olopatadine HCl  Apply 1-2 drops to eye 2 (two) times daily as needed (dry, itchy eyes.).     senna-docusate 8.6-50 MG per tablet  Commonly known as:  Senokot-S  Take 1 tablet by mouth at bedtime as needed for mild constipation (use as needed if you do not have a bowel movement during the day.).     sucralfate 1 GM/10ML suspension  Commonly known as:  CARAFATE  Take 10 mLs (1 g total) by mouth 2 (two) times daily before a meal.      traMADol 50 MG tablet  Commonly known as:  ULTRAM  Take 1 tablet (50 mg total) by mouth every 8 (eight) hours as needed for moderate pain.     VIACTIV MULTI-VITAMIN Chew  Chew 1 each by mouth daily.     zolpidem 5 MG tablet  Commonly known as:  AMBIEN  Take 5 mg by mouth at bedtime.       Follow-up Information   Follow up with CAMPBELL, [place], FNP. Schedule an appointment  as soon as possible for a visit in 1 week. (You need to follow up with Dr. Megan Salon for all your medical issues after discharge from the SNF.)    Specialty:  Family Medicine   Contact information:   [address] [place] [address] [phone]       Follow up with GIOFFRE,RONALD A, MD. Schedule an appointment as soon as possible for a visit in 2 weeks.   Specialty:  Orthopedic Surgery   Contact information:   [address] [place] [address] [phone]       Signed: Earnstine Regal 04/26/2014, 8:43 AM

## 2014-04-26 NOTE — Progress Notes (Signed)
Spoke with Dr. Grandville Silos- will accept patient on Palliative Service (Dr. Hilma Favors) given her high level of symptom management needs and stated goals of care. Will re-explore hospice facility option.  Lane Hacker, DO Palliative Medicine

## 2014-04-26 NOTE — Progress Notes (Signed)
Left voicemail for social worker to notify of patient's discharge orders.

## 2014-04-26 NOTE — Clinical Social Work Note (Signed)
Clinical Social Worker continuing to follow patient and family for support and discharge planning needs.  CSW received notification that patient was medically ready for discharge today and planned to discharge to [facility].  CSW made arrangements and spoke with patient at bedside - RN then notified CSW that PA discontinued discharge order for today.  CSW updated facility admissions coordinator and left packet for patient discharge on the chart.  CSW remains available for support and to facilitate patient discharge needs once medically ready.  Barbette Or, [place]

## 2014-04-27 DIAGNOSIS — R933 Abnormal findings on diagnostic imaging of other parts of digestive tract: Secondary | ICD-10-CM | POA: Diagnosis not present

## 2014-04-27 DIAGNOSIS — R110 Nausea: Secondary | ICD-10-CM

## 2014-04-27 DIAGNOSIS — K629 Disease of anus and rectum, unspecified: Secondary | ICD-10-CM

## 2014-04-27 LAB — CBC
HEMATOCRIT: 35.8 % — AB (ref 36.0–46.0)
Hemoglobin: 11.2 g/dL — ABNORMAL LOW (ref 12.0–15.0)
MCH: 24.8 pg — AB (ref 26.0–34.0)
MCHC: 31.3 g/dL (ref 30.0–36.0)
MCV: 79.2 fL (ref 78.0–100.0)
Platelets: 396 10*3/uL (ref 150–400)
RBC: 4.52 MIL/uL (ref 3.87–5.11)
RDW: 16 % — ABNORMAL HIGH (ref 11.5–15.5)
WBC: 8.5 10*3/uL (ref 4.0–10.5)

## 2014-04-27 MED ORDER — MORPHINE SULFATE (CONCENTRATE) 10 MG /0.5 ML PO SOLN
20.0000 mg | ORAL | Status: DC
  Administered 2014-04-27 – 2014-04-28 (×5): 20 mg via ORAL
  Filled 2014-04-27 (×5): qty 1

## 2014-04-27 MED ORDER — ONDANSETRON 4 MG PO TBDP
4.0000 mg | ORAL_TABLET | Freq: Three times a day (TID) | ORAL | Status: DC
  Administered 2014-04-27 – 2014-04-28 (×2): 4 mg via ORAL
  Filled 2014-04-27 (×5): qty 1

## 2014-04-27 MED ORDER — LIDOCAINE 5 % EX PTCH
2.0000 | MEDICATED_PATCH | CUTANEOUS | Status: DC
  Filled 2014-04-27: qty 2

## 2014-04-27 NOTE — Progress Notes (Signed)
Palliative Care Team at [facility] Note   SUBJECTIVE: Marrian looks weaker this afternoon but according to nurse she did with full assist get up into a chair. Meranda reports that this was excruciating. Calin also tells me that she really doesn't want to rehab- it is just to painful. She also tell me she doesn't think she is going to get better- "its not realistic". She asked me if I could increase her pain medication. She is also having nausea. "if i lay perfectly still i can bear it".  Interval Events: PMT consult   OBJECTIVE: Vital Signs: BP 101/61  Pulse 86  Temp(Src) 98.6 F (37 C) (Oral)  Resp 16  Ht 5' (1.524 m)  Wt 49.896 kg (110 lb)  BMI 21.48 kg/m2  SpO2 93%   Intake and Output: 09/26 0701 - 09/27 0700 In: 480 [P.O.:480] Out: -   Physical Exam: Frail, has difficulty holding her neck up but she is alert and oriented. Congested lung sounds on the right much more pronounced than yesterday. Tachycardic. +extremity edema  Allergies  Allergen Reactions  . Ensure [Nutritional Supplements]     "gives me diarrhea"  . Erythromycin Ethylsuccinate     Irregular pulse rate  . Prednisone     "makes me hyper"  . Doxycycline Rash    Medications: Scheduled Meds:  . ALPRAZolam  0.25 mg Oral QHS  . amiodarone  200 mg Oral Daily  . [name]'s butt cream   Topical BID  . [START ON 04/28/2014] lidocaine  2 patch Transdermal Q24H  . metoCLOPramide (REGLAN) injection  5 mg Intravenous Once  . metoCLOPramide  5 mg Oral TID AC  . morphine CONCENTRATE  20 mg Oral 6 times per day  . ondansetron  4 mg Oral 3 times per day  . pantoprazole  40 mg Oral BID  . sodium chloride  3 mL Intravenous Q12H  . sucralfate  1 g Oral BID    Continuous Infusions:    PRN Meds: sodium chloride, albuterol, dibucaine, guaiFENesin, LORazepam, morphine injection, senna-docusate, sodium chloride  Stool Softner: yes  Palliative Performance Scale: 30%   Pain Present?: YES   If yes:  Pain Score: 10/10 +movement Pain Location: Right arm, Right Rib cage sternum, abdomen   Labs: CBC    Component Value Date/Time   WBC 8.5 04/27/2014 1222   RBC 4.52 04/27/2014 1222   HGB 11.2* 04/27/2014 1222   HCT 35.8* 04/27/2014 1222   PLT 396 04/27/2014 1222   MCV 79.2 04/27/2014 1222   MCH 24.8* 04/27/2014 1222   MCHC 31.3 04/27/2014 1222   RDW 16.0* 04/27/2014 1222   LYMPHSABS 1.0 04/16/2014 0211   MONOABS 1.0 04/16/2014 0211   EOSABS 0.1 04/16/2014 0211   BASOSABS 0.0 04/16/2014 0211    CMET     Component Value Date/Time   NA 135* 04/24/2014 1600   K 3.4* 04/24/2014 1600   CL 97 04/24/2014 1600   CO2 30 04/24/2014 1600   GLUCOSE 132* 04/24/2014 1600   BUN 9 04/24/2014 1600   CREATININE 0.65 04/24/2014 1600   CREATININE 0.75 01/15/2013 1222   CALCIUM 8.5 04/24/2014 1600   PROT 4.7* 04/24/2014 1600   ALBUMIN 2.3* 04/24/2014 1600   AST 13 04/24/2014 1600   ALT 14 04/24/2014 1600   ALKPHOS 67 04/24/2014 1600   BILITOT 0.3 04/24/2014 1600   GFRNONAA 80* 04/24/2014 1600   GFRAA >90 04/24/2014 1600     Imaging:  I reviewed the CT scan yesterday- unsure what  to make of the rectal thickening-probably inflammation. Her tight sided rib fractures are more severe than visualized on XR. Other wise chronic changes and prior Esophageal sx-?stomach thickening. She has a stent.  Displaced fractures of the distal RIGHT clavicle and the RIGHT first  through sixth ribs posteriorly as well as additional displaced  fractures of the anterior RIGHT second third and fourth ribs.  ASSESSMENT/ PLAN:  1. Multiple Falls with associated Orthopedic injuries  Multiple >5 Rib (displaced) fractures, C-Spine Fractures, T-Spine Fracture  2. Lower GIB, suprisingly Hb has been stable, Rectal pain and cramping- CT showed Rectal Thickening of unknown significance 3. Uncontrolled Pain secondary to #1  4. Asthma  5. Frailty, debility  6. Afib  7 CHF, NYHF 1  8. Chronic Nausea  9. Prior History of esophageal cancer   10. Malnutrition, Low albumin 2.3  11. Osteoporosis   Pain still 10/10 with movement but it is now tolerable at rest- she still have occasional nausea episodes. I reviewed her CT to determine chest wall stability given her acute onset dyspnea and chest pain yesterday-again she is very fragile- her chest sounds are much more congested. It is all a matter of time before PNA becomes EOL (probably a few more days).   I updated [name] they continue to desire comfort and QOL as the primary goal. [name] tells me that Amyriah talked about dying last night- which is VERY different from her prior illnesses- she told her family that she was ready and that all of her affairs were in order. She is becoming discouraged with her suffering- I spoke with her about pain control, sedation and EOL issues frankly. She understands that increasing her medication may mean more sedation. I also told her that OOB and PO intake are all optional -she nodded and thanks me for trying to help. Her family felt relief that Namiyah actually said the word "hospice" and has made that choice on her own.  Will ask [facility] to reval in AM and also make sure a referral was also sent to [facility]- since either location was acceptable to them.   Increased SL Roxanol to 20mg  q4 hours  IV morphine for breakthrough pain q2 prn  Zofran ODT scheduled increase to TID  Reglan 5 mg TID- IV prn for refractory nausea  Hospice Facility tomorrow hopefully- I think this environment will serve Leina much better then here in the hospital and she and her family can focus on enjoying her before she declines and we can continue to provide advanced pain control and titration of pain medication- she has needed several IV doses of morphine.  Time In: 4PM Time Out: 445PM Total Time Spent with Patient: 45 minutes Total Overall Time: 66min   Greater than 50%  of this time was spent counseling and coordinating care related to the above assessment and  plan.   Acquanetta Chain, DO  04/27/2014, 4:16 PM  Please contact Palliative Medicine Team phone at [phone] for questions and concerns.

## 2014-04-28 DIAGNOSIS — I4891 Unspecified atrial fibrillation: Secondary | ICD-10-CM

## 2014-04-28 DIAGNOSIS — Z66 Do not resuscitate: Secondary | ICD-10-CM

## 2014-04-28 DIAGNOSIS — S42309S Unspecified fracture of shaft of humerus, unspecified arm, sequela: Secondary | ICD-10-CM

## 2014-04-28 DIAGNOSIS — IMO0002 Reserved for concepts with insufficient information to code with codable children: Secondary | ICD-10-CM

## 2014-04-28 MED ORDER — MORPHINE SULFATE (CONCENTRATE) 10 MG /0.5 ML PO SOLN: 20.0000 mg | ORAL | Status: AC

## 2014-04-28 MED ORDER — ONDANSETRON 4 MG PO TBDP: 4.0000 mg | ORAL_TABLET | Freq: Three times a day (TID) | ORAL | Status: AC

## 2014-04-28 MED ORDER — GERHARDT'S BUTT CREAM: 1.0000 "application " | TOPICAL_CREAM | Freq: Two times a day (BID) | CUTANEOUS | Status: AC

## 2014-04-28 MED ORDER — ACETAMINOPHEN 650 MG RE SUPP
650.0000 mg | RECTAL | Status: DC | PRN
  Administered 2014-04-28: 650 mg via RECTAL
  Filled 2014-04-28: qty 1

## 2014-04-28 MED ORDER — ALBUTEROL SULFATE (2.5 MG/3ML) 0.083% IN NEBU: 3.0000 mL | INHALATION_SOLUTION | Freq: Four times a day (QID) | RESPIRATORY_TRACT | Status: AC | PRN

## 2014-04-28 MED ORDER — METOCLOPRAMIDE HCL 5 MG PO TABS: 5.0000 mg | ORAL_TABLET | Freq: Three times a day (TID) | ORAL | Status: AC

## 2014-04-28 MED ORDER — SUCRALFATE 1 GM/10ML PO SUSP: 1.0000 g | Freq: Three times a day (TID) | ORAL | Status: AC

## 2014-04-28 MED ORDER — ACETAMINOPHEN 650 MG RE SUPP: 650.0000 mg | RECTAL | Status: AC | PRN

## 2014-04-28 MED ORDER — ALPRAZOLAM 0.25 MG PO TABS: 0.2500 mg | ORAL_TABLET | Freq: Every day | ORAL | Status: AC

## 2014-04-28 NOTE — Progress Notes (Signed)
OT Cancellation Note  Patient Details Name: [name] MRN: [id number] DOB: 07/24/1929   Cancelled Treatment:    Reason Eval/Treat Not Completed: Other (comment) (Pt going to hospice today. OT signing off.)   Benito Mccreedy OTR/L [phone] 04/28/2014, 1:06 PM

## 2014-04-28 NOTE — Discharge Summary (Addendum)
Patient XT:[name]      DOB: Aug 20, 1928      ZHG:[id number]  Addendum to discharge summary :   Interval Hospital Course: Please see Surgical Discharge summaries dated 9/26.  The Palliative Medicine Team at [facility] care of this patient on 9/26.  Patient's Pain medications were adjusted to promote comfort as we explored options for safe and effective discharge.  Patient and family expressed desire for full comfort care understanding that this may result in patient's death.  Patient has persisted in having rectal bleeding, has continue to have poor if not absent appetite. She conveyed to Dr. Hilma Favors her desire for aggressive symptom management as she could not take being in so much pain.  CT scan was completed on 9/26 :  Multiple fractures involving RIGHT ribs and distal RIGHT clavicle as  above.  Post esophagectomy and gastric pull-up with endoluminal stent  identified extending from the stomach to the proximal duodenum.  Small BILATERAL pleural effusions and basilar atelectasis slightly  decreased.  Questionable rectal wall thickening, could be infectious,  inflammatory or neoplastic, recommend proctoscopy.    Family and patient desire Full comfort care at [facility] .  [facility] has reevaluated patient this am and deemed her appropriate for transition.  Discharge Diagnosis:  1. Multiple Falls with associated Orthopedic injuries  Multiple >5 Rib (displaced) fractures, C-Spine Fractures, T-Spine Fracture  2. Lower GIB, suprisingly Hb has been stable, Rectal pain and cramping- CT showed Rectal Thickening of unknown significance  3. Uncontrolled Pain secondary to #1  4. Asthma  5. Frailty, debility  6. Afib  7 CHF, NYHF 1  8. Chronic Nausea  9. Prior History of esophageal cancer  10. Malnutrition, Low albumin 2.3  11. Osteoporosis  12:  Cough with sputum: likely impending pneumonia .  Family reiterates focus on comfort care, no antibiotics to be  ordered. 13. VRE in urine.  On contact precautions.  No new curative treatments.   Pain has improved with scheduled Roxanol, but movement remains difficult.  Patient not eating if at all.  Prognosis: Poor  Condition at Discharge guarded and likely near death if bleeding persists.  Time frame days to week without eating and drinking.   Total time :   See my note from earlier today and 1205-  1220 spoke with family and checked on patient.   Yevonne Yokum L. Lovena Le, MD MBA The Palliative Medicine Team at [facility] Phone: [phone] Pager: [phone] ( Use team phone after hours)

## 2014-04-28 NOTE — Clinical Social Work Note (Signed)
CSW was notified by MD Lovena Le that pt will be re-evaluated by [facility] today due to pt decline.  CSW was notified by Yetta Glassman at [facility] that pt is now hospice appropriate and will be admitted to [facility] today.  Beverlee Nims will have appropriate paperwork signed and requested PTAR be arranged for 1:30pm.  CSW will assist.  [place] [facility] hosp[facility] is in need of no additional paperwork.  PTAR packet is on the chart for transportation. DNR form complete and on chart.  Nonnie Done, [place] [phone]  Psychiatric & Orthopedics (5N 1-16) Clinical Social Worker

## 2014-04-28 NOTE — Progress Notes (Signed)
PT Discharge/ Cancellation Note  Patient Details Name: [name] MRN: [id number] DOB: 23-Jul-1929   Cancelled Treatment:    Reason Eval/Treat Not Completed: Other (comment), pt transferring to hospice today, desires to remain still and not mobilize at this point. PT signing off.   [place], [name] 04/28/2014, 1:20 PM

## 2014-04-28 NOTE — Progress Notes (Signed)
Palliative Care Team at [facility] Progress Note   SUBJECTIVE: Currently, the patient patient awakens easily but is distracted. Slow to answer questions.  She is able to identify [name] as her Air traffic controller. She states her pain is tolerable but could not number it. She relates that she is not hungry and her breakfast sits untouched. Interval Events: Nursing states that her night was uneventful and that she was mainly sleeping.   OBJECTIVE: Vital Signs: BP 116/71  Pulse 82  Temp(Src) 99.3 F (37.4 C) (Oral)  Resp 16  Ht 5' (1.524 m)  Wt 49.896 kg (110 lb)  BMI 21.48 kg/m2  SpO2 94%   Intake and Output: 09/27 0701 - 09/28 0700 In: 480 [P.O.:480] Out: -   Physical Exam: General: Vital signs reviewed and noted. Well-developed, reasonably-nourished, in no acute distress; alert, appropriate and cooperative throughout examination. Slow to answer complex questions. Pain meds increased yesterday.  Head: Normocephalic, atraumatic.  Lungs:  Normal respiratory effort. Clear to auscultation BL without crackles or wheezes. Gentle palpation did not elicit   Heart: RRR. S1 and S2 normal without gallop,  or rubs. No murmur  Abdomen:  BS normoactive. Soft, Nondistended, non-tender.  No masses or organomegaly.  Extremities: No pretibial edema.    Allergies  Allergen Reactions  . Ensure [Nutritional Supplements]     "gives me diarrhea"  . Erythromycin Ethylsuccinate     Irregular pulse rate  . Prednisone     "makes me hyper"  . Doxycycline Rash    Medications: Scheduled Meds:  . ALPRAZolam  0.25 mg Oral QHS  . amiodarone  200 mg Oral Daily  . [name]'s butt cream   Topical BID  . lidocaine  2 patch Transdermal Q24H  . metoCLOPramide (REGLAN) injection  5 mg Intravenous Once  . metoCLOPramide  5 mg Oral TID AC  . morphine CONCENTRATE  20 mg Oral 6 times per day  . ondansetron  4 mg Oral 3 times per day  . pantoprazole  40 mg Oral BID  . sodium chloride  3 mL Intravenous  Q12H  . sucralfate  1 g Oral BID    Continuous Infusions: No . Scheduled Roxanol.  PRN Meds: sodium chloride, albuterol, dibucaine, guaiFENesin, LORazepam, morphine injection, senna-docusate, sodium chloride  Stool Softner:  Prn docusate, Last BM  Palliative Performance Scale: 20 %   Pain Present?: Reports pain tolerable   If yes: Pain Score:  Unable to score/10 Pain Location: Chest    Labs: CBC    Component Value Date/Time   WBC 8.5 04/27/2014 1222   RBC 4.52 04/27/2014 1222   HGB 11.2* 04/27/2014 1222   HCT 35.8* 04/27/2014 1222   PLT 396 04/27/2014 1222   MCV 79.2 04/27/2014 1222   MCH 24.8* 04/27/2014 1222   MCHC 31.3 04/27/2014 1222   RDW 16.0* 04/27/2014 1222   LYMPHSABS 1.0 04/16/2014 0211   MONOABS 1.0 04/16/2014 0211   EOSABS 0.1 04/16/2014 0211   BASOSABS 0.0 04/16/2014 0211    CMET     Component Value Date/Time   NA 135* 04/24/2014 1600   K 3.4* 04/24/2014 1600   CL 97 04/24/2014 1600   CO2 30 04/24/2014 1600   GLUCOSE 132* 04/24/2014 1600   BUN 9 04/24/2014 1600   CREATININE 0.65 04/24/2014 1600   CREATININE 0.75 01/15/2013 1222   CALCIUM 8.5 04/24/2014 1600   PROT 4.7* 04/24/2014 1600   ALBUMIN 2.3* 04/24/2014 1600   AST 13 04/24/2014 1600   ALT 14 04/24/2014 1600  ALKPHOS 67 04/24/2014 1600   BILITOT 0.3 04/24/2014 1600   GFRNONAA 80* 04/24/2014 1600   GFRAA >90 04/24/2014 1600     Imaging: Chest Xray Reviewed/ Impressions: There has not been dramatic interval change in the appearance of the  chest since the study of 18 September. However, given the presence  of the intra thoracic stomach from the will through, small amounts  of new atelectasis or fluid at the lung bases would be  radiographically inapparent.   CT scan of the Head Reviewed/Impressions: Multiple fractures involving RIGHT ribs and distal RIGHT clavicle as  above.  Post esophagectomy and gastric pull-up with endoluminal stent  identified extending from the stomach to the proximal duodenum.   Small BILATERAL pleural effusions and basilar atelectasis slightly  decreased.  Questionable rectal wall thickening, could be infectious,  inflammatory or neoplastic, recommend proctoscopy.     ASSESSMENT/ PLAN:  Will contact [facility] to reassess needs. Likely DC to hospice home today.     Time In: 845 am Time Out: 900 am Total Time Spent with Patient: 15 min Total Overall Time: 15 min   Greater than 50%  of this time was spent counseling and coordinating care related to the above assessment and plan.   Marcelle Smiling, MD  04/28/2014, 11:11 AM  Please contact Palliative Medicine Team phone at [phone] for questions and concerns.

## 2014-05-01 ENCOUNTER — Telehealth: Payer: Self-pay | Admitting: Family

## 2014-05-01 NOTE — Telephone Encounter (Signed)
Pt fell again broke every bone in her right back rib.  Also broke a neck bone.  Pt is at hospice in [place].  At this time, they say she may never come out of there.  [name] would love to talk w/ you Padonda.

## 2014-05-01 NOTE — Telephone Encounter (Signed)
Left message for [name] to call back.

## 2014-05-02 NOTE — Telephone Encounter (Signed)
pls cb on cell number [phone]

## 2014-05-08 IMAGING — CR DG CHEST 2V
3 series · 3 of 3 positions shown · non-contrast
Comparison: September 09, 2013.

CLINICAL DATA: Pneumonia.

EXAM:
CHEST  2 VIEW

[view not recorded (1 of 3)]
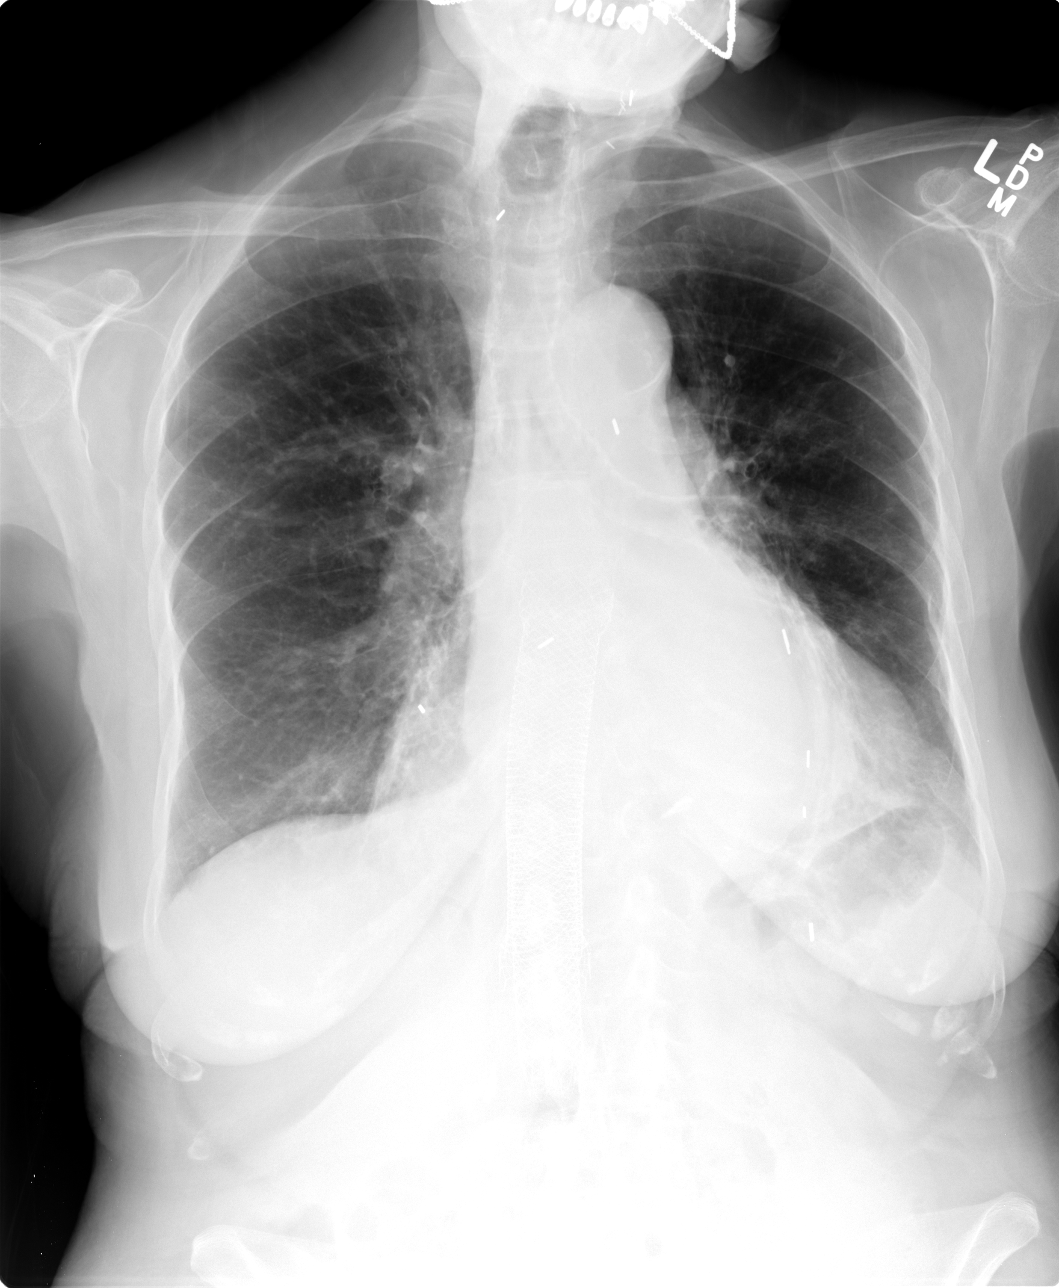

[view not recorded (2 of 3)]
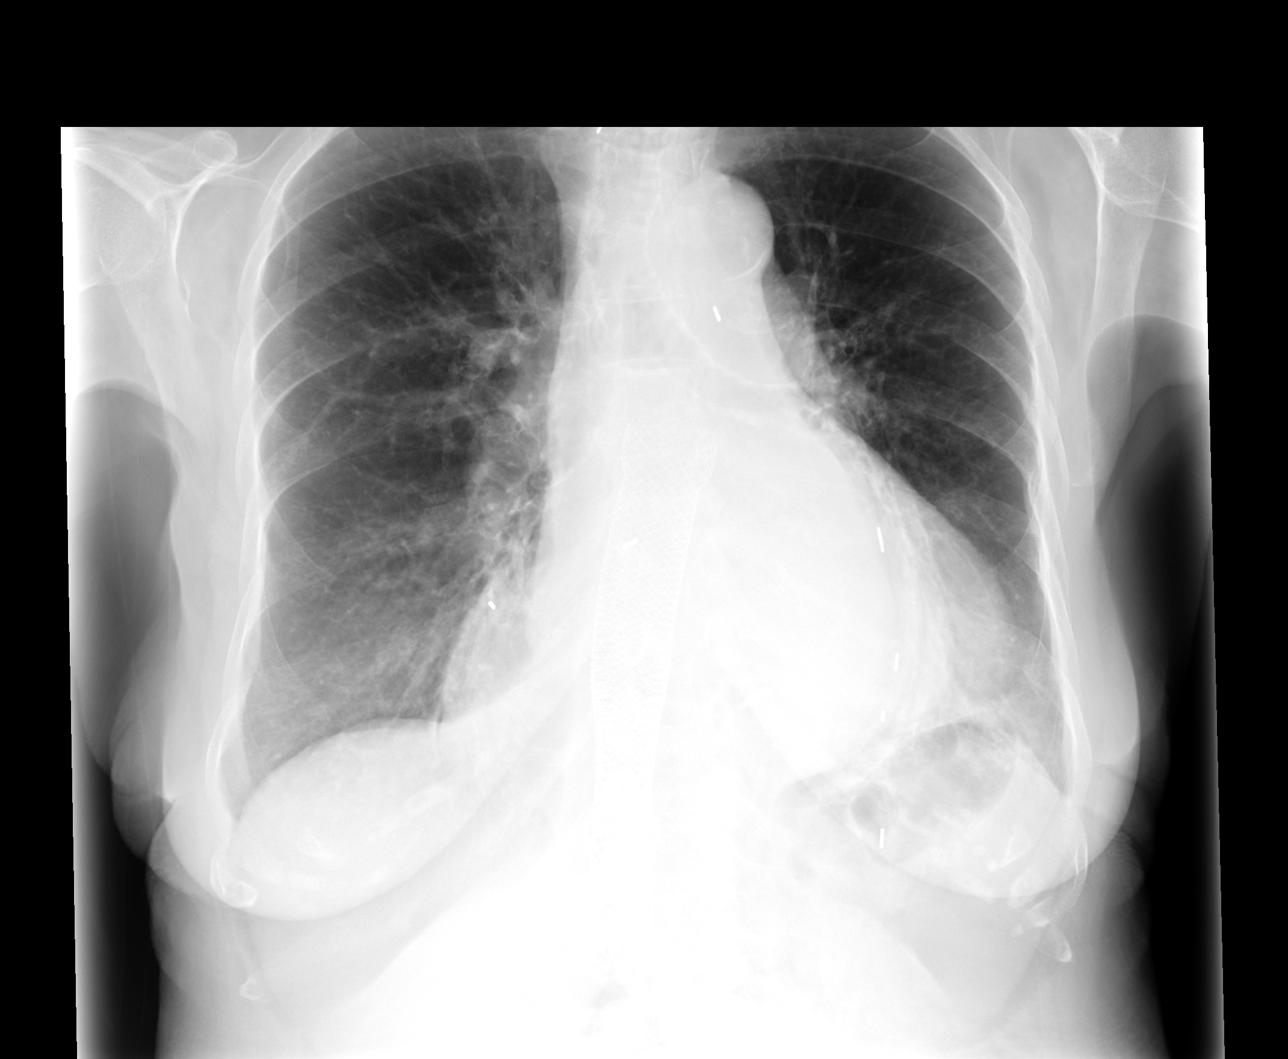

[view not recorded (3 of 3)]
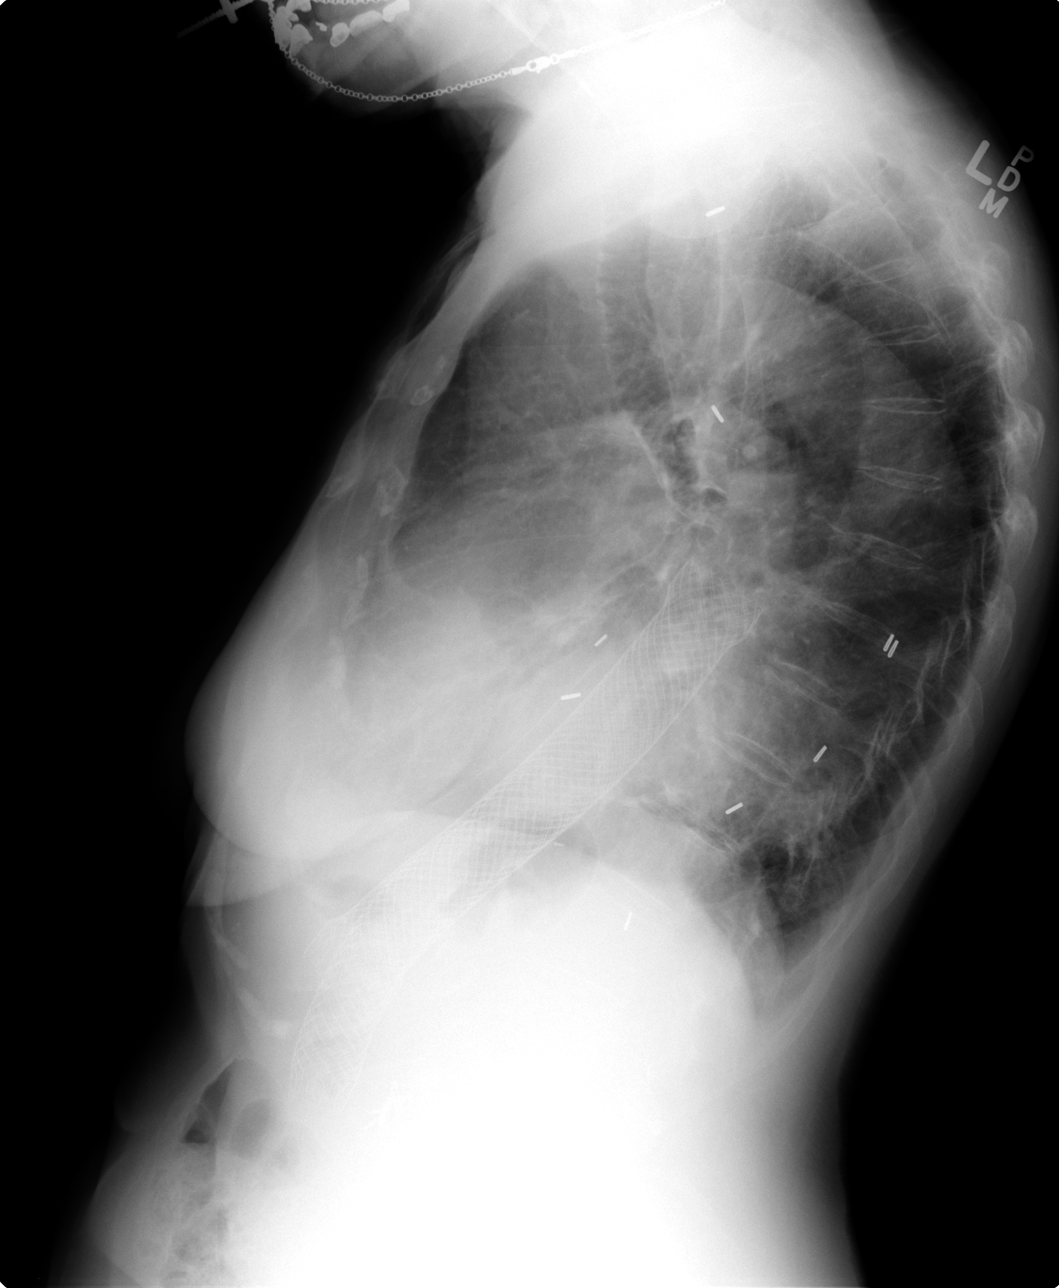

[3 of 3 positions shown; findings below may reference images not displayed]

FINDINGS: Stable cardiomegaly. Large distal esophageal stent is again noted.
No pneumothorax or pleural effusion is noted. Right lower lobe
opacity noted on prior exam has resolved. No acute pulmonary disease
is noted. Bony thorax appears intact. Retrocardiac density is noted
consistent with history of gastric pull-through.
IMPRESSION: Pneumonia noted on prior exam has resolved. No acute pulmonary
disease is seen currently.

## 2014-05-09 ENCOUNTER — Telehealth: Payer: Self-pay | Admitting: Family

## 2014-05-09 NOTE — Telephone Encounter (Signed)
FYI per judy ritter the patient passed away last night

## 2014-05-09 NOTE — Telephone Encounter (Signed)
Noted  

## 2014-06-01 DEATH — deceased

## 2014-06-02 ENCOUNTER — Ambulatory Visit: Payer: Medicare Other | Admitting: Family

## 2014-09-03 IMAGING — CR DG CHEST 2V
2 series · 2 of 2 positions shown · non-contrast
Comparison: 01/22/2014

CLINICAL DATA: Chronic cough for 3 months pneumonia, chest pain due
to cough, weakness, history asthma, esophageal cancer

EXAM:
CHEST  2 VIEW

[w chest pa]
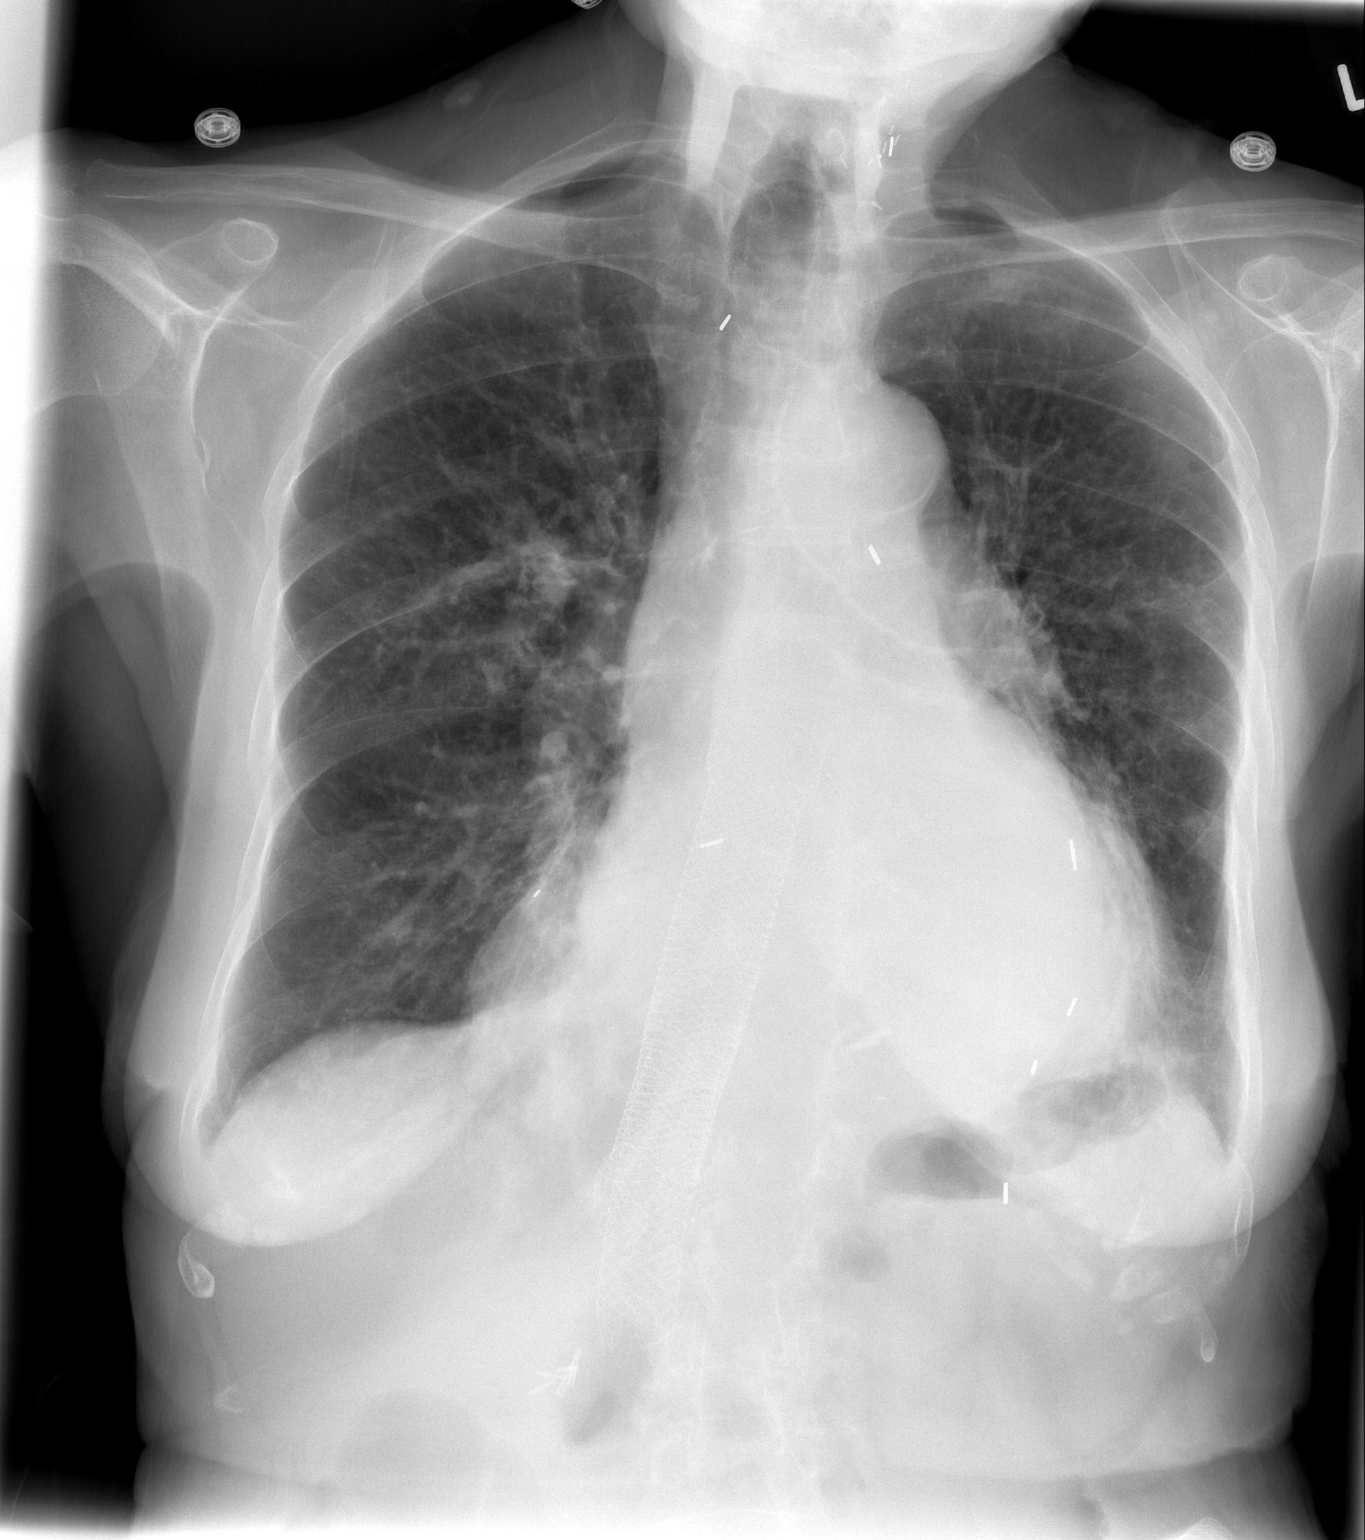

[w chest lat]
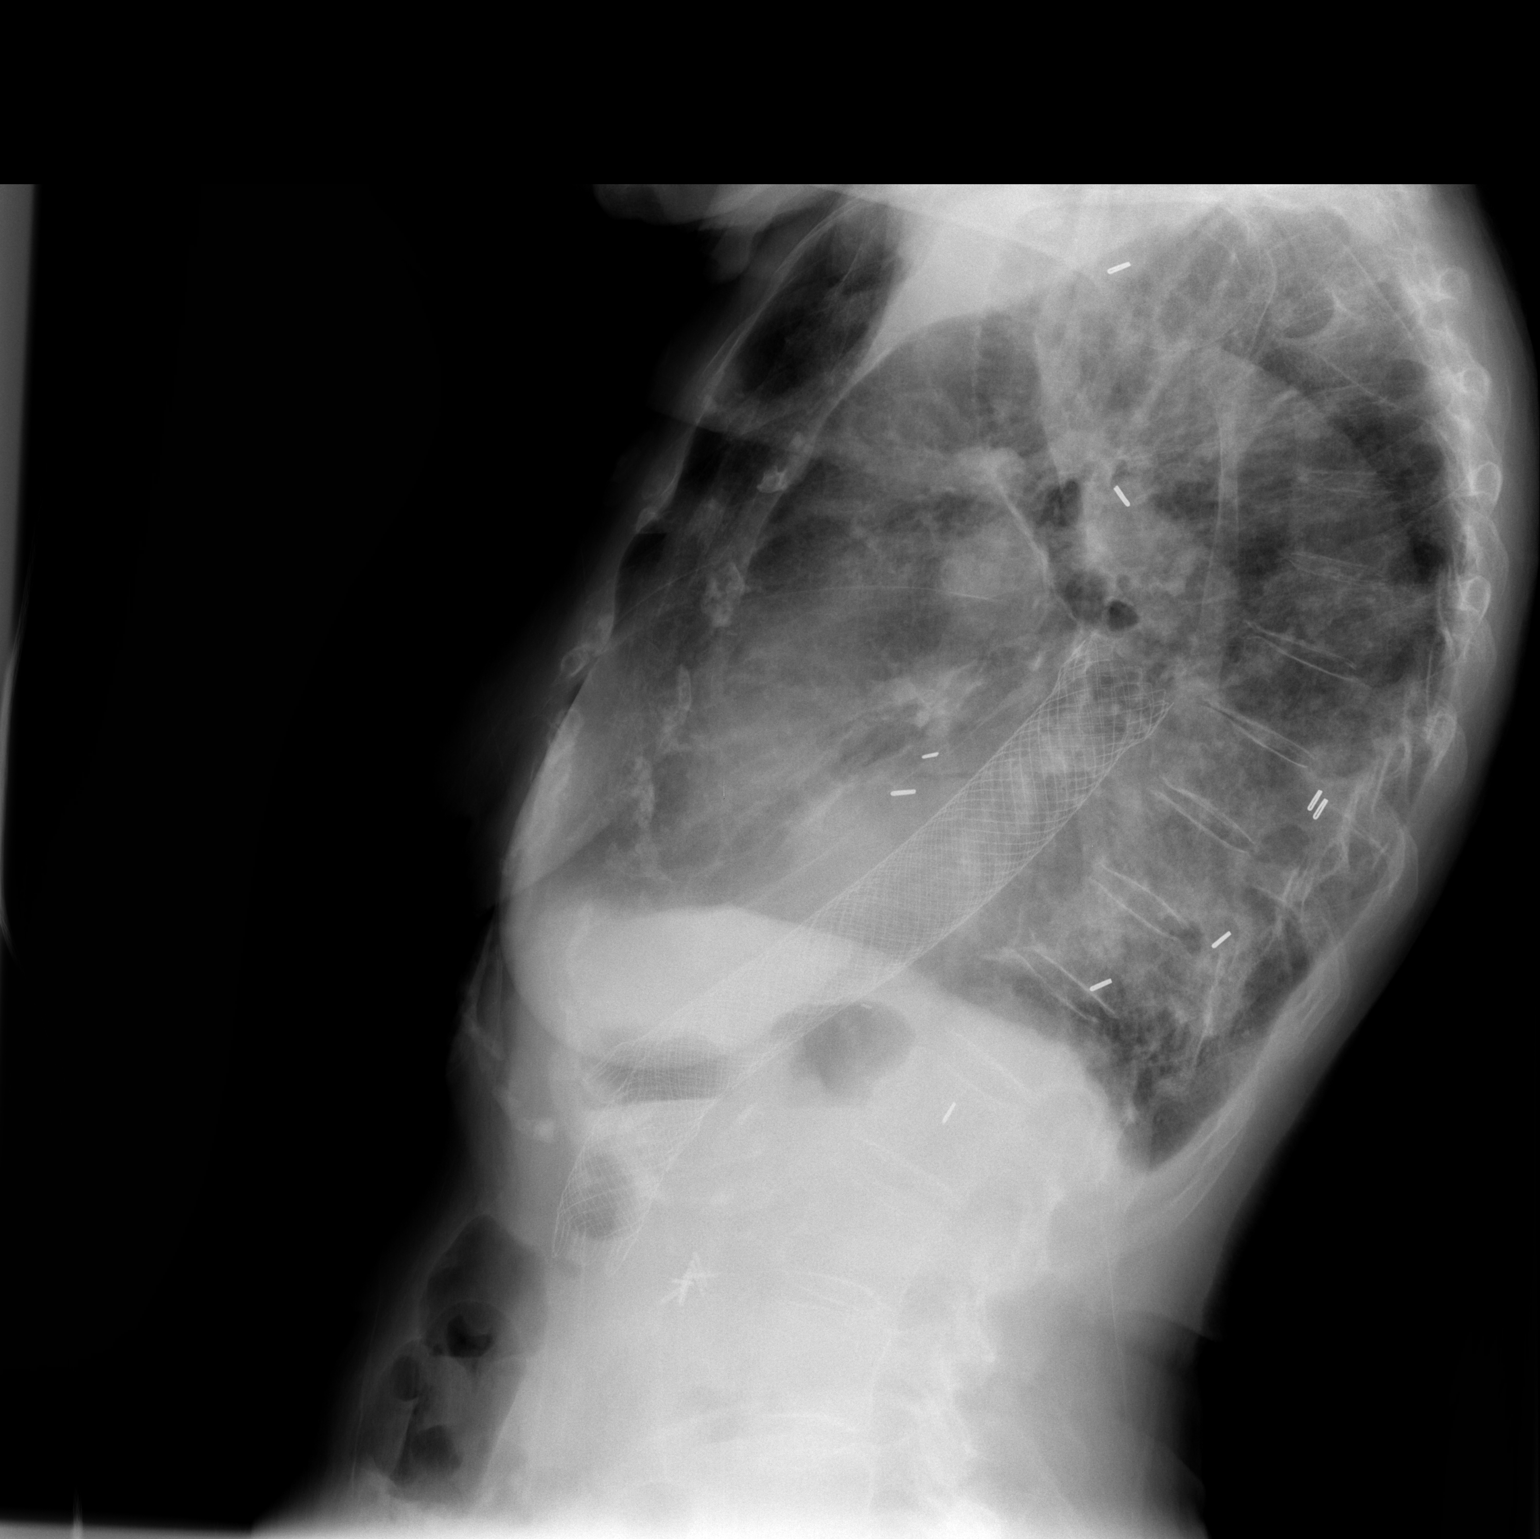

[2 of 2 positions shown; findings below may reference images not displayed]

FINDINGS: Enlargement of cardiac silhouette.

Calcified tortuous aorta.

Pulmonary vascular congestion.

Emphysematous and bronchitic changes consistent with COPD.

Chronic atelectasis versus infiltrate LEFT lower lobe.

Remaining lungs grossly clear.

No pleural effusion or pneumothorax.

Diffuse osseous demineralization.

Post esophageal resection and gastric pull-up with duodenal stent
placement.
IMPRESSION: Enlargement of cardiac silhouette with pulmonary vascular
congestion.

COPD changes with chronic atelectasis versus infiltrate LEFT lower
lobe.

Gastric pull-up with stable appearance of duodenal stent.

No acute abnormalities.

## 2014-09-25 IMAGING — CR DG HAND COMPLETE 3+V*L*
3 series · 3 of 3 positions shown · non-contrast
Comparison: None.

CLINICAL DATA: Fall with hand pain.

EXAM:
LEFT HAND - COMPLETE 3+ VIEW

[x hand pa left]
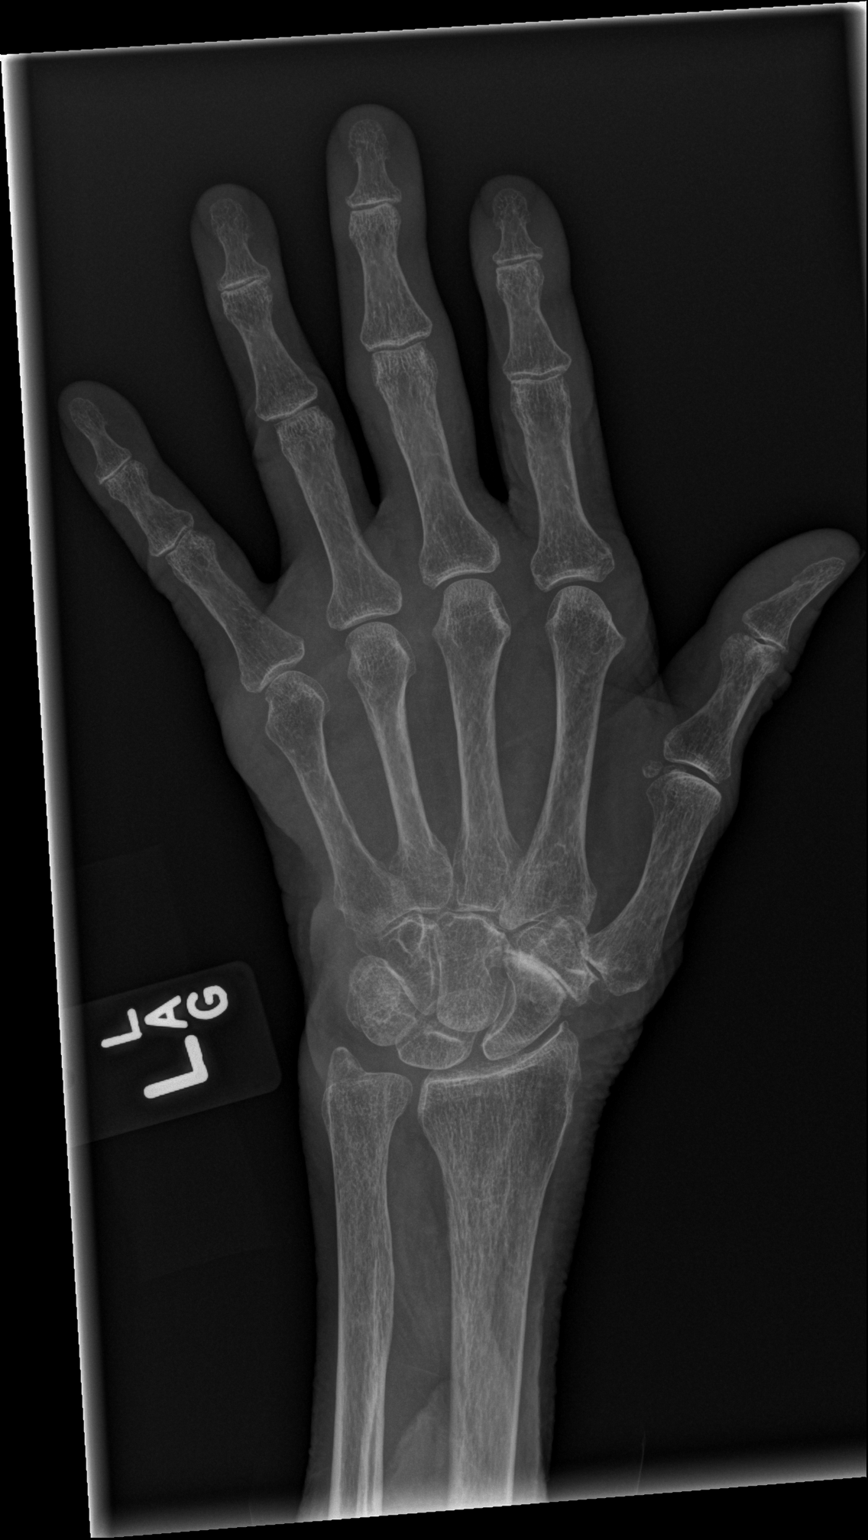

[x hand obl left]
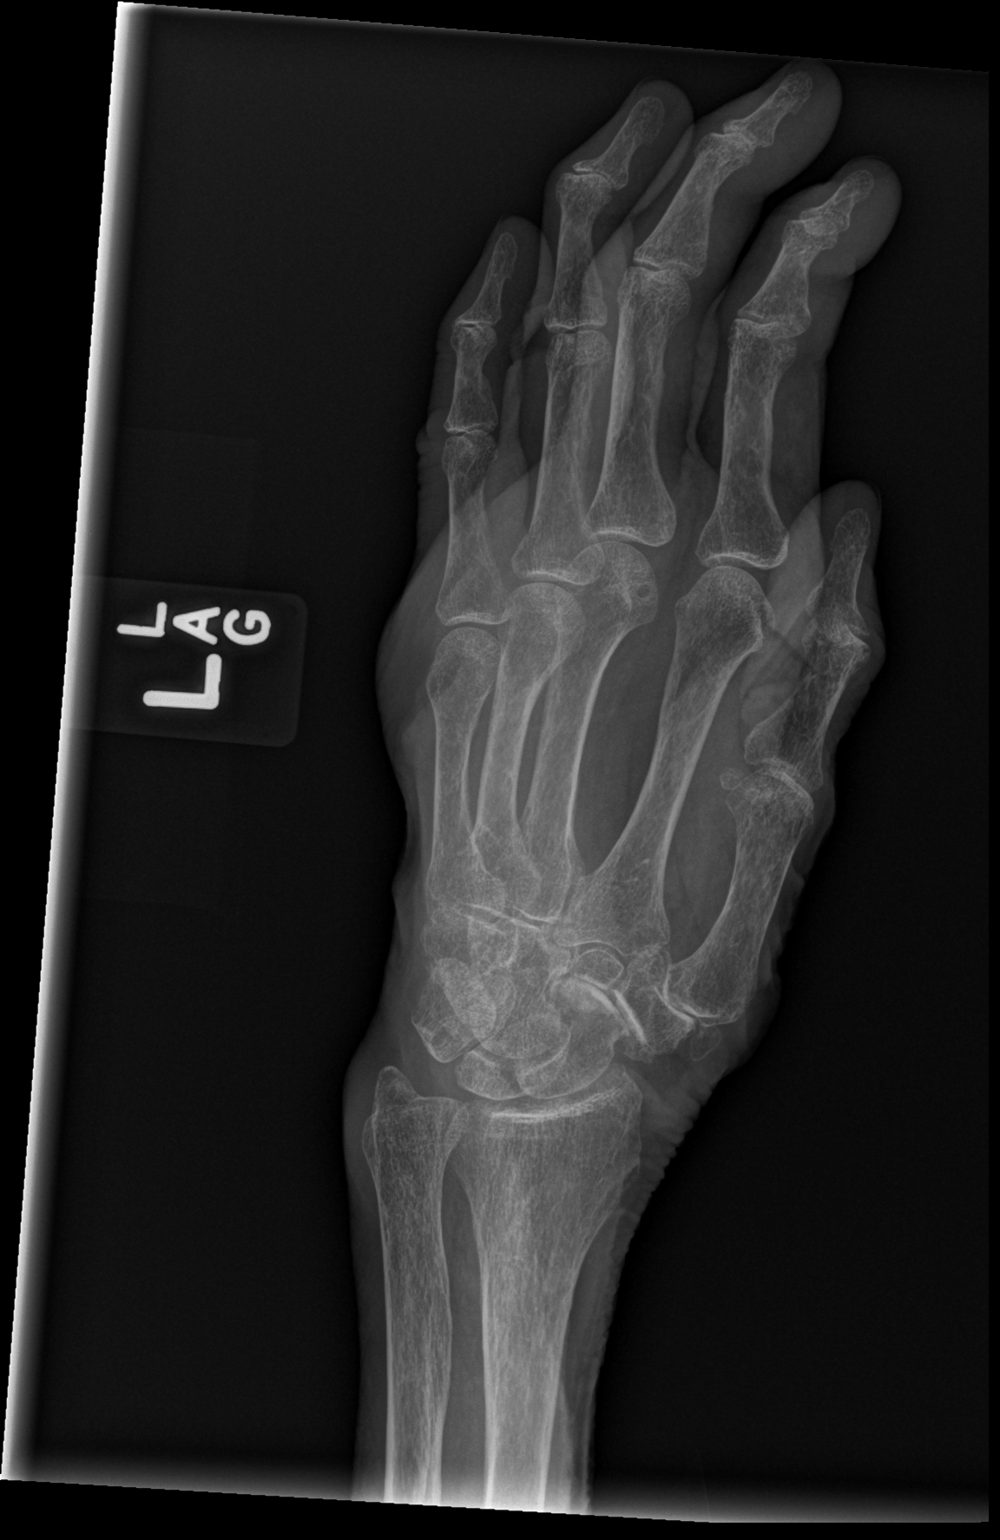

[x hand lat left]
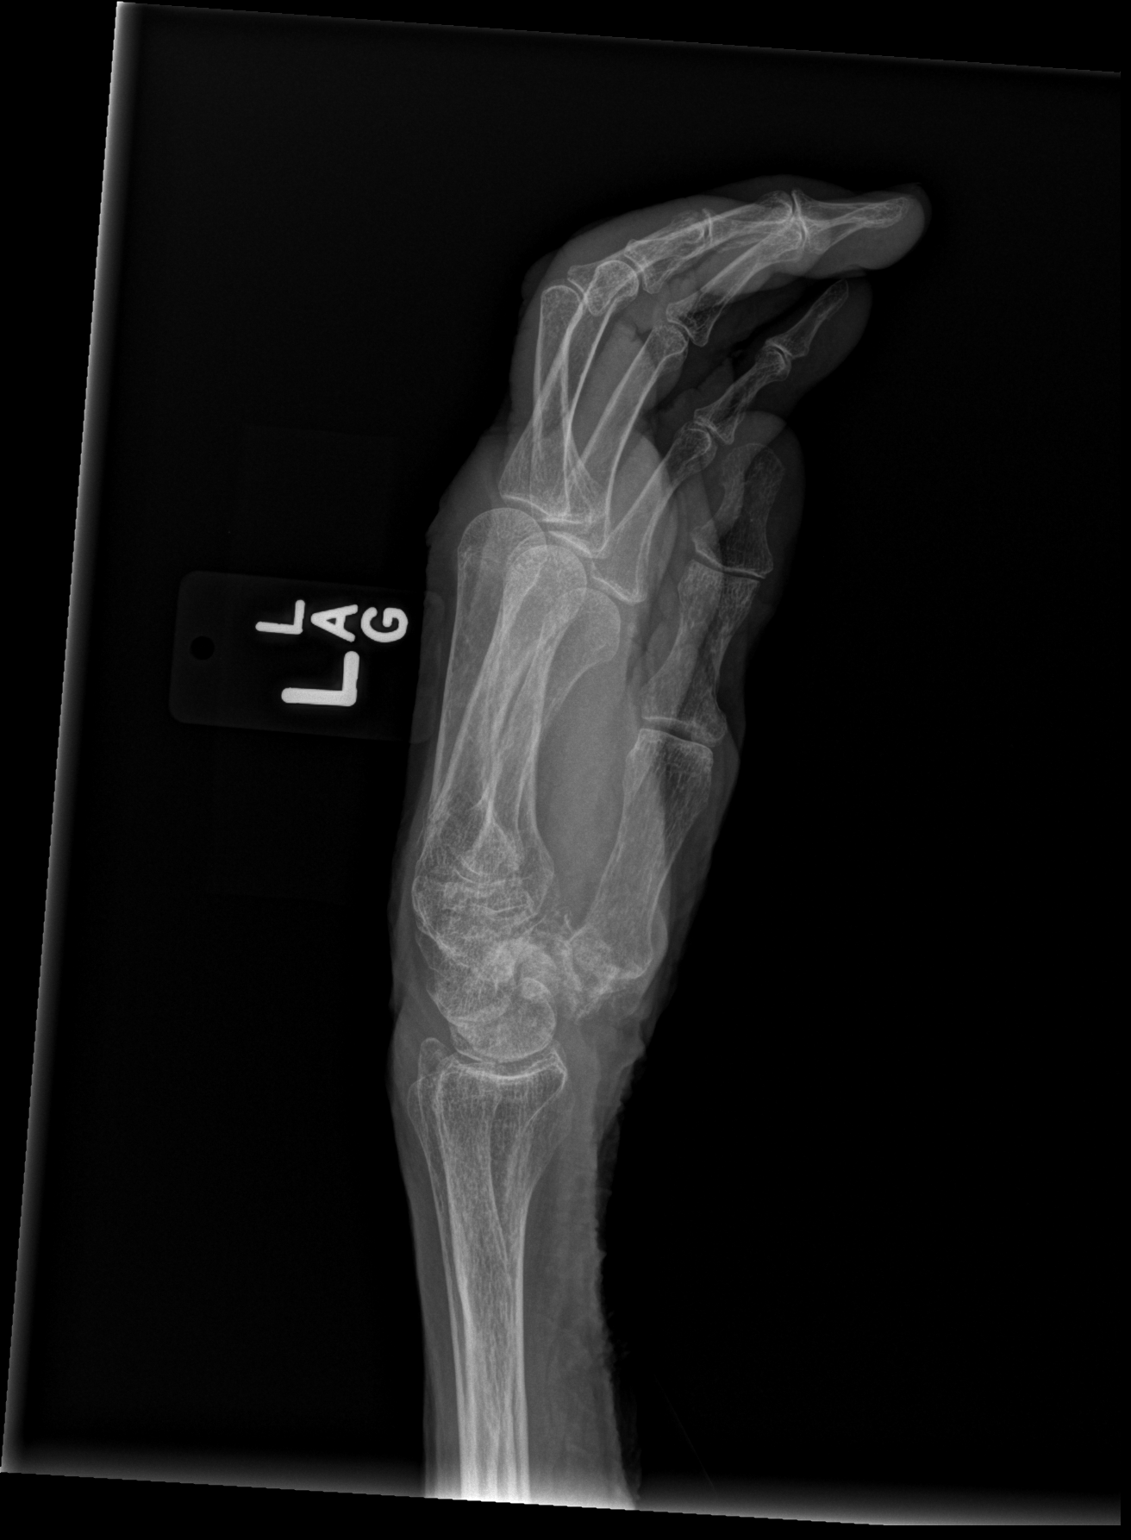

[3 of 3 positions shown; findings below may reference images not displayed]

FINDINGS: Soft tissue swelling dorsally at the level of the MCP joints. No
suspected fracture. No dislocation.

Generalized osteopenia. Advanced degenerative change at the first
CMC and STT joints.
IMPRESSION: No acute osseous findings.
